# Patient Record
Sex: Male | Born: 1935 | Race: White | Hispanic: No | Marital: Married | Smoking: Former smoker
Health system: Southern US, Community
[De-identification: ages and names within clinical notes are randomized; demographics above are authoritative.]

## PROBLEM LIST (undated history)

## (undated) DIAGNOSIS — J479 Bronchiectasis, uncomplicated: Secondary | ICD-10-CM

## (undated) DIAGNOSIS — I10 Essential (primary) hypertension: Secondary | ICD-10-CM

## (undated) DIAGNOSIS — Z87442 Personal history of urinary calculi: Secondary | ICD-10-CM

## (undated) DIAGNOSIS — H9190 Unspecified hearing loss, unspecified ear: Secondary | ICD-10-CM

## (undated) DIAGNOSIS — N400 Enlarged prostate without lower urinary tract symptoms: Secondary | ICD-10-CM

## (undated) DIAGNOSIS — I639 Cerebral infarction, unspecified: Secondary | ICD-10-CM

## (undated) DIAGNOSIS — N200 Calculus of kidney: Secondary | ICD-10-CM

## (undated) HISTORY — PX: CHOLECYSTECTOMY: SHX55

## (undated) HISTORY — PX: SPLENECTOMY, TOTAL: SHX788

## (undated) HISTORY — PX: SPINE SURGERY: SHX786

## (undated) HISTORY — PX: NOSE SURGERY: SHX723

## (undated) HISTORY — PX: APPENDECTOMY: SHX54

---

## 2005-03-20 ENCOUNTER — Emergency Department (HOSPITAL_COMMUNITY): Admission: EM | Admit: 2005-03-20 | Discharge: 2005-03-20 | Payer: Self-pay | Admitting: Emergency Medicine

## 2005-03-20 IMAGING — CT CT HEAD W/O CM
1 series · 16 of 30 positions shown, 20 images · IV contrast (agent unspecified)
Comparison: None.

CLINICAL DATA: Fell off ladder. 
 HEAD CT WITHOUT CONTRAST:
TECHNIQUE: Contiguous axial images were obtained from the base of the skull through the vertex according to standard protocol without contrast.

[Series 2: brain w/o 4.8 h45s st · axial · non-contrast · 0.43mm/px · z∈[-142,+13]mm · 16 of 36 slices shown, 20 images]
[im 2/36  brain]
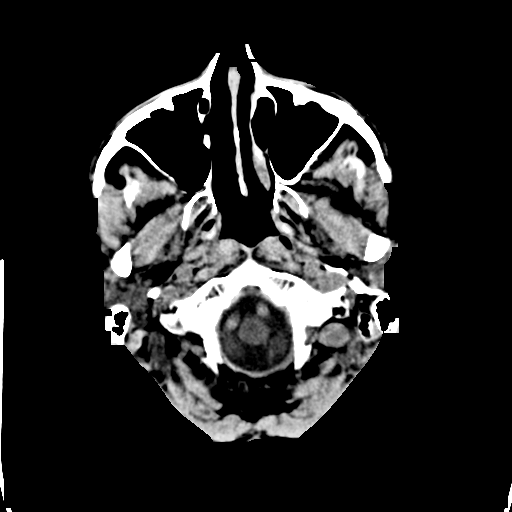
[im 2/36  bone]
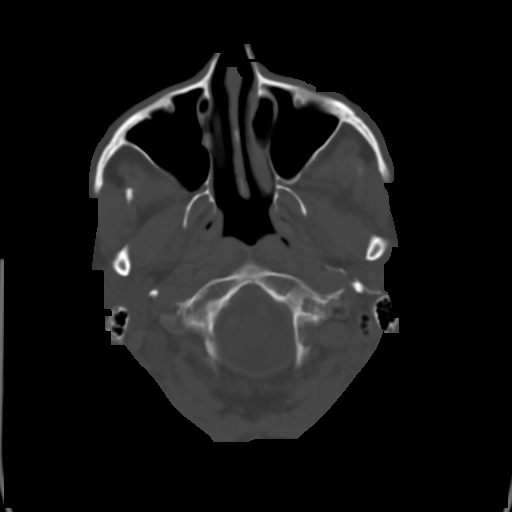
[im 4/36  brain]
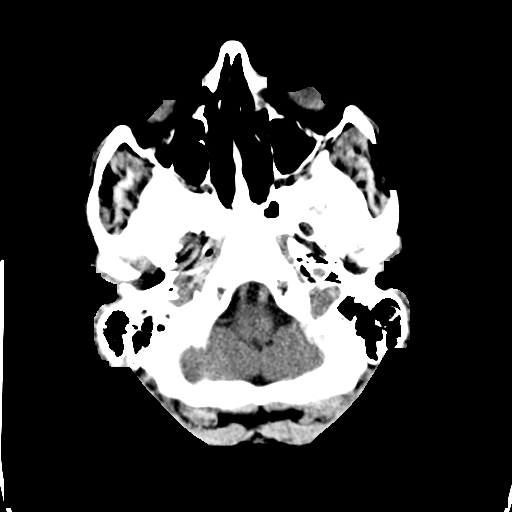
[im 7/36  brain]
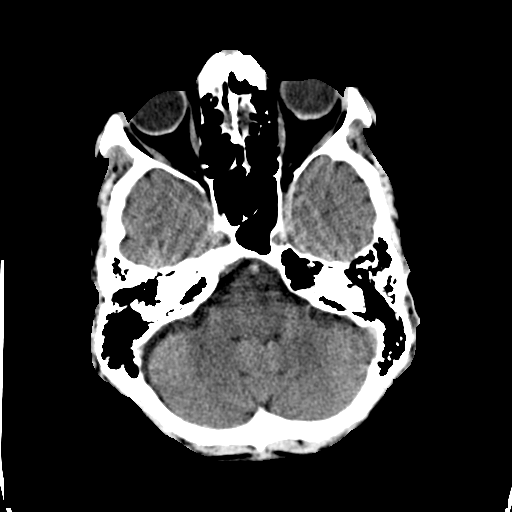
[im 9/36  brain]
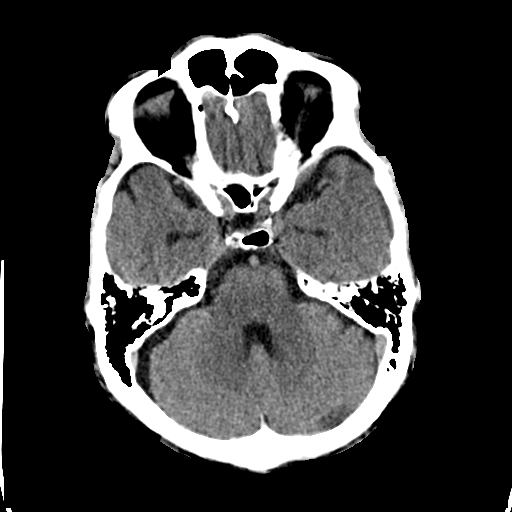
[im 10/36  brain]
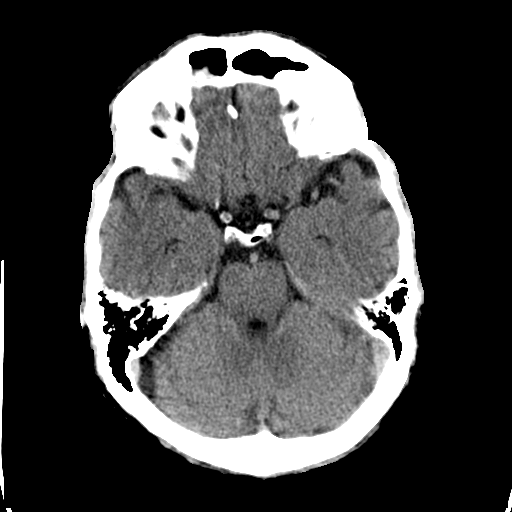
[im 10/36  bone]
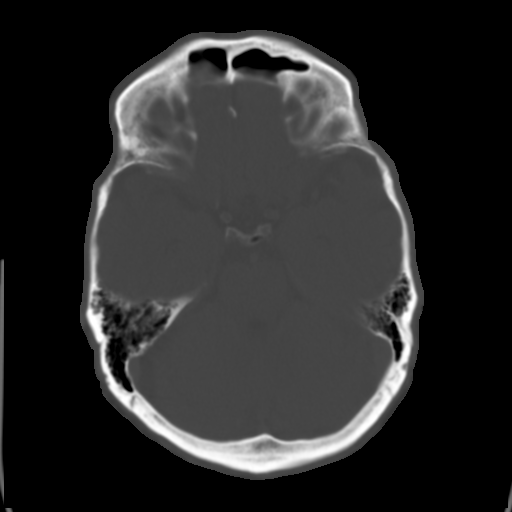
[im 13/36  brain]
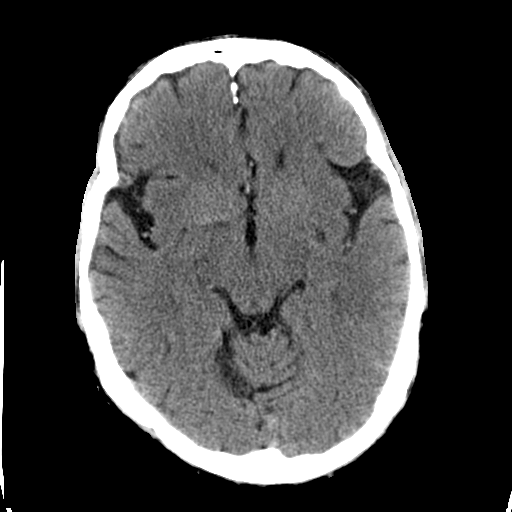
[im 15/36  brain]
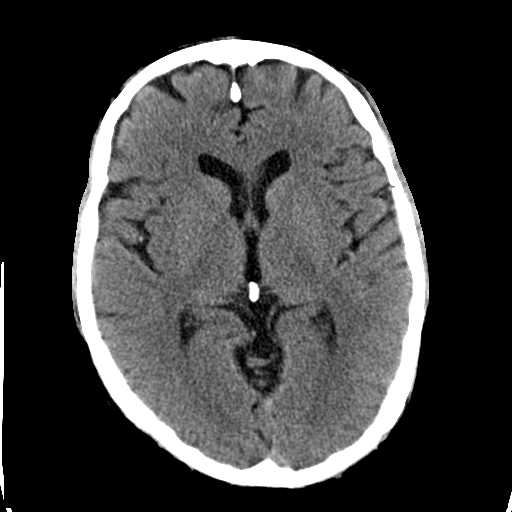
[im 17/36  brain]
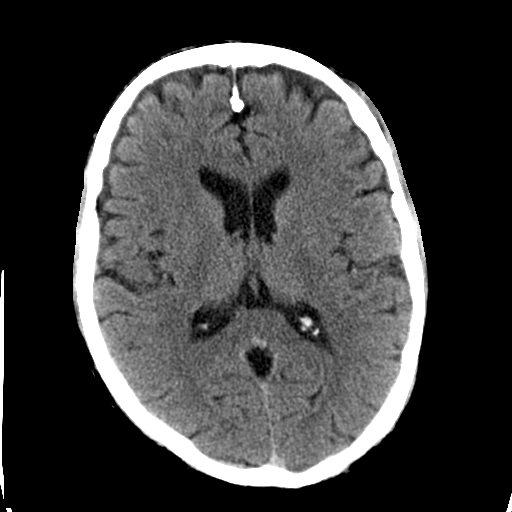
[im 19/36  brain]
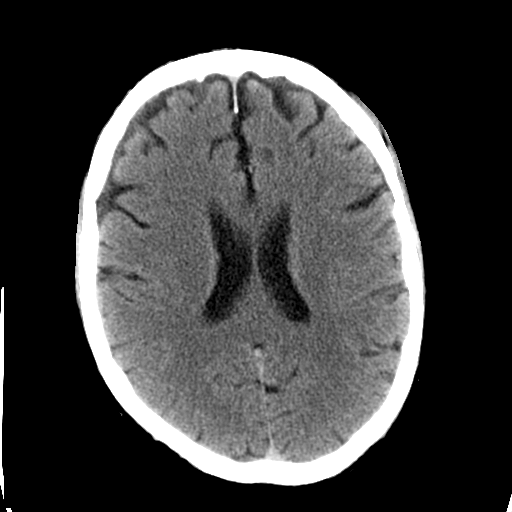
[im 19/36  bone]
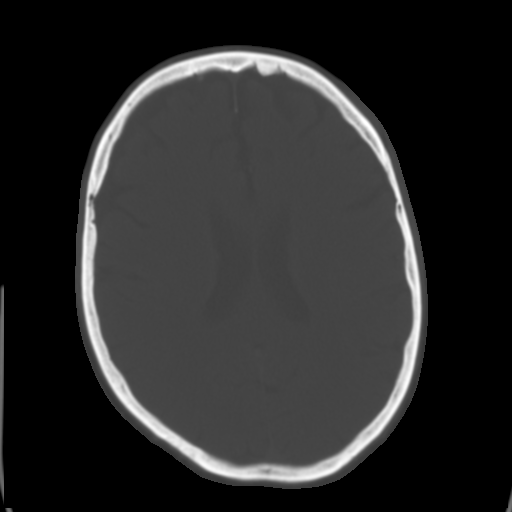
[im 21/36  brain]
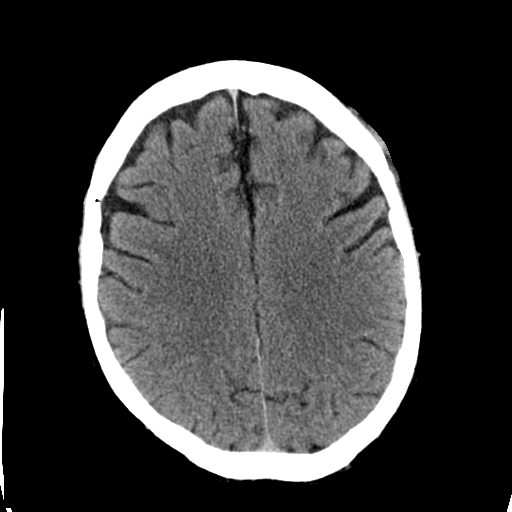
[im 23/36  brain]
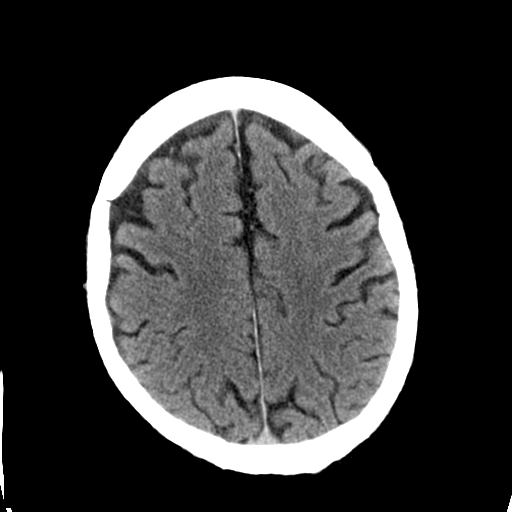
[im 26/36  brain]
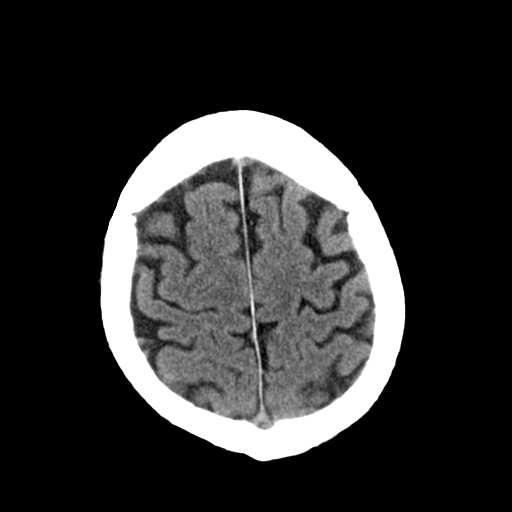
[im 27/36  brain]
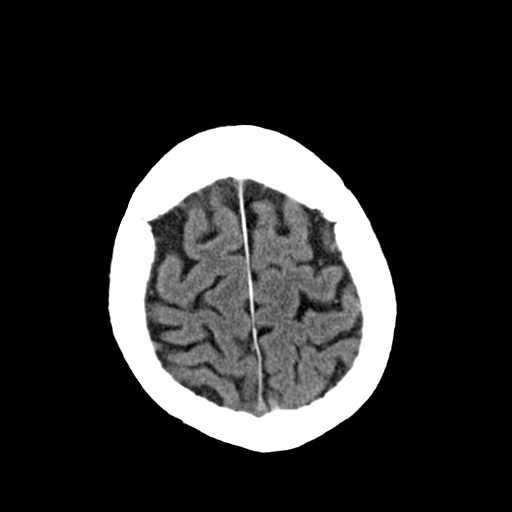
[im 27/36  bone]
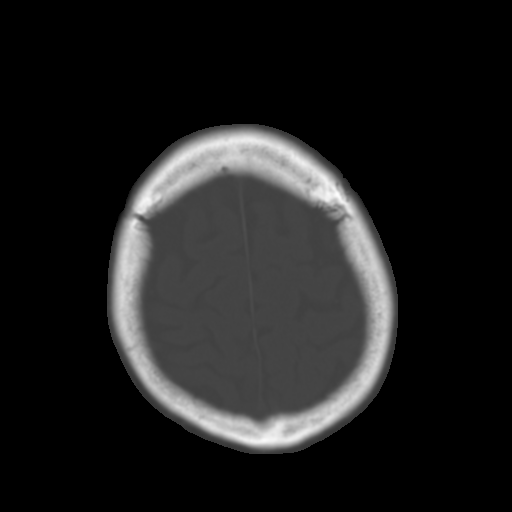
[im 29/36  brain]
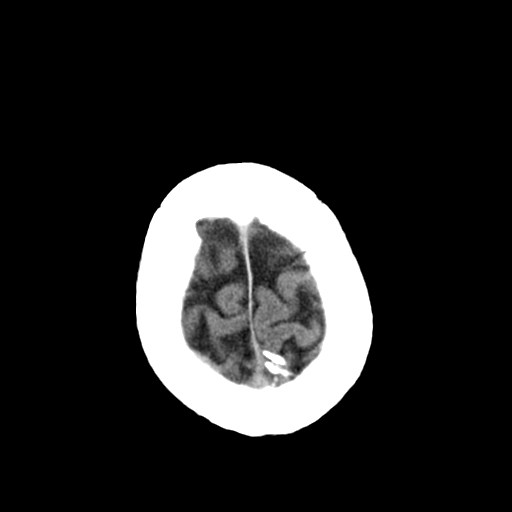
[im 32/36  brain]
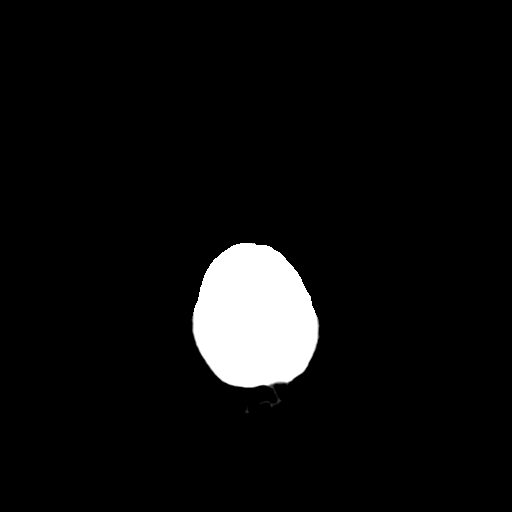
[im 34/36  brain]
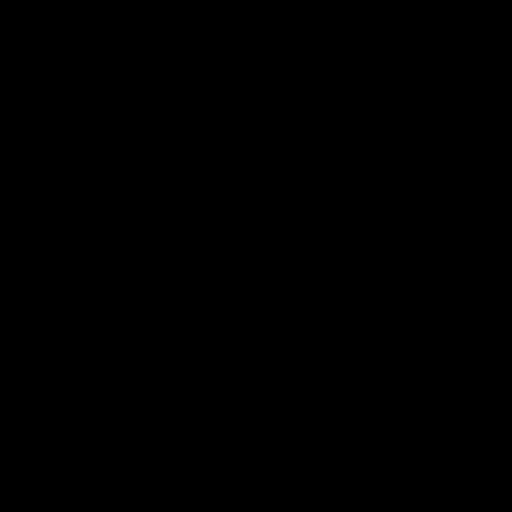

[16 of 30 positions shown; findings below may reference images not displayed]

FINDINGS: There is no intracranial hemorrhage or mass.  There is a chronic infarct in the left lateral basal ganglia.  There is no acute infarct or mass.  There is left frontal scalp hematoma.  There is no skull fracture.
IMPRESSION: No acute intracranial abnormality.

## 2013-08-14 DIAGNOSIS — J310 Chronic rhinitis: Secondary | ICD-10-CM | POA: Insufficient documentation

## 2013-08-14 DIAGNOSIS — J449 Chronic obstructive pulmonary disease, unspecified: Secondary | ICD-10-CM | POA: Insufficient documentation

## 2013-08-14 DIAGNOSIS — H9193 Unspecified hearing loss, bilateral: Secondary | ICD-10-CM | POA: Insufficient documentation

## 2013-12-20 DIAGNOSIS — Z9081 Acquired absence of spleen: Secondary | ICD-10-CM | POA: Insufficient documentation

## 2014-06-05 DIAGNOSIS — K219 Gastro-esophageal reflux disease without esophagitis: Secondary | ICD-10-CM | POA: Insufficient documentation

## 2015-09-22 DIAGNOSIS — I10 Essential (primary) hypertension: Secondary | ICD-10-CM | POA: Insufficient documentation

## 2017-05-17 DIAGNOSIS — G459 Transient cerebral ischemic attack, unspecified: Secondary | ICD-10-CM

## 2017-05-17 HISTORY — DX: Transient cerebral ischemic attack, unspecified: G45.9

## 2017-06-06 DIAGNOSIS — Z87891 Personal history of nicotine dependence: Secondary | ICD-10-CM | POA: Insufficient documentation

## 2017-07-15 DIAGNOSIS — R918 Other nonspecific abnormal finding of lung field: Secondary | ICD-10-CM | POA: Insufficient documentation

## 2017-07-15 DIAGNOSIS — R042 Hemoptysis: Secondary | ICD-10-CM | POA: Insufficient documentation

## 2018-01-23 DIAGNOSIS — Z8673 Personal history of transient ischemic attack (TIA), and cerebral infarction without residual deficits: Secondary | ICD-10-CM | POA: Insufficient documentation

## 2019-03-19 DIAGNOSIS — G4733 Obstructive sleep apnea (adult) (pediatric): Secondary | ICD-10-CM | POA: Insufficient documentation

## 2019-05-18 DIAGNOSIS — A430 Pulmonary nocardiosis: Secondary | ICD-10-CM

## 2019-05-18 HISTORY — DX: Pulmonary nocardiosis: A43.0

## 2019-08-29 DIAGNOSIS — D580 Hereditary spherocytosis: Secondary | ICD-10-CM | POA: Insufficient documentation

## 2019-08-29 DIAGNOSIS — L821 Other seborrheic keratosis: Secondary | ICD-10-CM | POA: Insufficient documentation

## 2019-09-14 ENCOUNTER — Encounter (HOSPITAL_COMMUNITY): Payer: Self-pay | Admitting: Emergency Medicine

## 2019-09-14 ENCOUNTER — Other Ambulatory Visit: Payer: Self-pay

## 2019-09-14 ENCOUNTER — Emergency Department (HOSPITAL_COMMUNITY)
Admission: EM | Admit: 2019-09-14 | Discharge: 2019-09-14 | Disposition: A | Discharge status: Home / Self Care | Payer: Medicare HMO | Attending: Emergency Medicine | Admitting: Emergency Medicine

## 2019-09-14 DIAGNOSIS — R0789 Other chest pain: Secondary | ICD-10-CM | POA: Insufficient documentation

## 2019-09-14 DIAGNOSIS — I10 Essential (primary) hypertension: Secondary | ICD-10-CM | POA: Diagnosis not present

## 2019-09-14 DIAGNOSIS — Z87891 Personal history of nicotine dependence: Secondary | ICD-10-CM | POA: Insufficient documentation

## 2019-09-14 HISTORY — DX: Calculus of kidney: N20.0

## 2019-09-14 HISTORY — DX: Essential (primary) hypertension: I10

## 2019-09-14 HISTORY — DX: Benign prostatic hyperplasia without lower urinary tract symptoms: N40.0

## 2019-09-14 LAB — CBC WITH DIFFERENTIAL/PLATELET
Abs Immature Granulocytes: 0.02 10*3/uL (ref 0.00–0.07)
Basophils Absolute: 0.1 10*3/uL (ref 0.0–0.1)
Basophils Relative: 1 %
Eosinophils Absolute: 0.5 10*3/uL (ref 0.0–0.5)
Eosinophils Relative: 6 %
HCT: 42.5 % (ref 39.0–52.0)
Hemoglobin: 14.1 g/dL (ref 13.0–17.0)
Immature Granulocytes: 0 %
Lymphocytes Relative: 32 %
Lymphs Abs: 2.5 10*3/uL (ref 0.7–4.0)
MCH: 31.8 pg (ref 26.0–34.0)
MCHC: 33.2 g/dL (ref 30.0–36.0)
MCV: 95.7 fL (ref 80.0–100.0)
Monocytes Absolute: 1 10*3/uL (ref 0.1–1.0)
Monocytes Relative: 12 %
Neutro Abs: 3.9 10*3/uL (ref 1.7–7.7)
Neutrophils Relative %: 49 %
Platelets: 358 10*3/uL (ref 150–400)
RBC: 4.44 MIL/uL (ref 4.22–5.81)
RDW: 12.1 % (ref 11.5–15.5)
WBC: 7.9 10*3/uL (ref 4.0–10.5)
nRBC: 0 % (ref 0.0–0.2)

## 2019-09-14 LAB — COMPREHENSIVE METABOLIC PANEL
ALT: 12 U/L (ref 0–44)
AST: 20 U/L (ref 15–41)
Albumin: 3.4 g/dL — ABNORMAL LOW (ref 3.5–5.0)
Alkaline Phosphatase: 50 U/L (ref 38–126)
Anion gap: 9 (ref 5–15)
BUN: 22 mg/dL (ref 8–23)
CO2: 28 mmol/L (ref 22–32)
Calcium: 9.6 mg/dL (ref 8.9–10.3)
Chloride: 101 mmol/L (ref 98–111)
Creatinine, Ser: 1.02 mg/dL (ref 0.61–1.24)
GFR calc Af Amer: >60 mL/min (ref >60)
GFR calc non Af Amer: >60 mL/min (ref >60)
Glucose, Bld: 113 mg/dL — ABNORMAL HIGH (ref 70–99)
Potassium: 4.4 mmol/L (ref 3.5–5.1)
Sodium: 138 mmol/L (ref 135–145)
Total Bilirubin: 0.6 mg/dL (ref 0.3–1.2)
Total Protein: 7 g/dL (ref 6.5–8.1)

## 2019-09-14 LAB — PROTIME-INR
INR: 1.1 (ref 0.8–1.2)
Prothrombin Time: 13.4 seconds (ref 11.4–15.2)

## 2019-09-14 LAB — TROPONIN I (HIGH SENSITIVITY): Troponin I (High Sensitivity): 4 ng/L (ref <18)

## 2019-09-14 NOTE — ED Provider Notes (Signed)
[facility] EMERGENCY DEPARTMENT Provider Note   CSN: [id number] Arrival date & time: 09/14/19  0865     History Chief Complaint  Patient presents with  . Abnormal Lab    Samuel Mata is a 86 y.o. male.  Patient is an 86 year old male with past medical history of hypertension.  He presents today for evaluation of an abnormal laboratory studies.  Patient tells me that he was called by his cardiologist and informed that his troponin was 13 and that he should come to the ER to be evaluated.  Patient reports having intermittent tightness in his chest for the past month.  He describes 1 or 2 episodes per day that are not associated with shortness of breath, nausea, or diaphoresis.  These episodes last approximately 15 seconds, then resolved spontaneously.  He denies any exertional symptoms.  He has no prior cardiac history.  The history is provided by the patient.       Past Medical History:  Diagnosis Date  . Enlarged prostate   . Hypertension   . Renal stones     There are no problems to display for this patient.        No family history on file.  Social History   Tobacco Use  . Smoking status: Former Research scientist (life sciences)  . Smokeless tobacco: Never Used  Substance Use Topics  . Alcohol use: Never  . Drug use: Never    Home Medications Prior to Admission medications   Not on File    Allergies    Codeine  Review of Systems   Review of Systems  All other systems reviewed and are negative.   Physical Exam Updated Vital Signs BP (!) 146/98 (BP Location: Left Arm)   Pulse 66   Temp 98.4 F (36.9 C) (Oral)   Resp 15   Ht 5\' 7"  (1.702 m)   Wt 65 kg   SpO2 100%   BMI 22.44 kg/m   Physical Exam Vitals and nursing note reviewed.  Constitutional:      General: He is not in acute distress.    Appearance: He is well-developed. He is not diaphoretic.  HENT:     Head: Normocephalic and atraumatic.  Cardiovascular:     Rate and Rhythm: Normal  rate and regular rhythm.     Heart sounds: No murmur. No friction rub.  Pulmonary:     Effort: Pulmonary effort is normal. No respiratory distress.     Breath sounds: Normal breath sounds. No wheezing or rales.  Abdominal:     General: Bowel sounds are normal. There is no distension.     Palpations: Abdomen is soft.     Tenderness: There is no abdominal tenderness.  Musculoskeletal:        General: Normal range of motion.     Cervical back: Normal range of motion and neck supple.  Skin:    General: Skin is warm and dry.  Neurological:     Mental Status: He is alert and oriented to person, place, and time.     Coordination: Coordination normal.     ED Results / Procedures / Treatments   Labs (all labs ordered are listed, but only abnormal results are displayed) Labs Reviewed  COMPREHENSIVE METABOLIC PANEL - Abnormal; Notable for the following components:      Result Value   Glucose, Bld 113 (*)    Albumin 3.4 (*)    All other components within normal limits  CBC WITH DIFFERENTIAL/PLATELET  PROTIME-INR  TROPONIN I (  HIGH SENSITIVITY)  TROPONIN I (HIGH SENSITIVITY)    EKG EKG Interpretation  Date/Time:  Friday September 14 2019 19:02:29 EDT Ventricular Rate:  80 PR Interval:  170 QRS Duration: 98 QT Interval:  364 QTC Calculation: 419 R Axis:   -63 Text Interpretation: Normal sinus rhythm Left anterior fascicular block Abnormal ECG No prior ecg for comparison Confirmed by Geoffery Lyons ([id number]) on 09/14/2019 9:22:30 PM   Radiology No results found.  Procedures Procedures (including critical care time)  Medications Ordered in ED Medications - No data to display  ED Course  I have reviewed the triage vital signs and the nursing notes.  Pertinent labs & imaging results that were available during my care of the patient were reviewed by me and considered in my medical decision making (see chart for details).    MDM Rules/Calculators/A&P  Patient presenting here with  complaints of abnormal lab/chest discomfort as described in the HPI.  Patient's work-up here reveals a normal troponin and unchanged EKG.  Patient is not experiencing any discomfort and has not experienced any today prior to coming here.  His troponin here is 4 and the wife reports to me it was 13 when drawn earlier today.  I suspect there was some confusion as to whether this troponin of 13 was rated on the old troponin scale, not the new high-sensitivity scale.  That is the only rationale I can think of that would explain the difference in these 2 readings.  In discussion with the patient, I feel as though discharge is appropriate and patient and wife are comfortable with this as well.  I will have him keep his appointment for his stress test that was previously scheduled and return to the ER if symptoms worsen or change.  Final Clinical Impression(s) / ED Diagnoses Final diagnoses:  None    Rx / DC Orders ED Discharge Orders    None       Geoffery Lyons, MD 09/14/19 2135

## 2019-09-14 NOTE — Discharge Instructions (Addendum)
Keep your appointment for your stress test as previously scheduled.  Return to the ER in the meantime if your symptoms significantly worsen or change.

## 2019-09-14 NOTE — ED Notes (Signed)
Patient verbalizes understanding of discharge instructions. Opportunity for questioning and answers were provided. Armband removed by staff, pt discharged from ED with wife.   

## 2019-09-14 NOTE — ED Notes (Signed)
Staff advised not to collect second troponin per MD Delo. Pt to be discharged.

## 2019-09-14 NOTE — ED Triage Notes (Signed)
Patient received a call from MD clinic and advised him to go to ER due to elevated Troponin blood test result , denies chest pain / respirations unlabored .

## 2019-10-08 ENCOUNTER — Other Ambulatory Visit: Payer: Self-pay

## 2019-10-08 ENCOUNTER — Ambulatory Visit: Payer: Medicare HMO | Admitting: Internal Medicine

## 2019-10-08 ENCOUNTER — Encounter: Payer: Self-pay | Admitting: Internal Medicine

## 2019-10-08 VITALS — BP 110/60 | HR 85 | Temp 98.1°F | Ht 67.0 in | Wt 132.4 lb

## 2019-10-08 DIAGNOSIS — J479 Bronchiectasis, uncomplicated: Secondary | ICD-10-CM | POA: Diagnosis not present

## 2019-10-08 MED ORDER — FLUTTER DEVI: 1.0000 | Freq: Two times a day (BID) | 0 refills | Status: DC

## 2019-10-08 MED ORDER — SODIUM CHLORIDE 3 % IN NEBU: INHALATION_SOLUTION | RESPIRATORY_TRACT | 12 refills | Status: DC | PRN

## 2019-10-08 NOTE — Addendum Note (Signed)
Addended by: Delrae Rend on: 10/08/2019 03:46 PM   Modules accepted: Orders

## 2019-10-08 NOTE — Patient Instructions (Addendum)
The patient should have follow up scheduled with APP in 2 months.   Prior to next visit patient should have: Spirometry/Feno  Start doing nebulizer treatments with saline twice a day followed by the flutter valve to help bring up mucus.  Please bring your sputum culture - first one in the morning - in for [place] to send to the lab for culture. Please bring three of these.   Bronchiectasis  Bronchiectasis is a condition in which the airways in the lungs (bronchi) are damaged and widened. The condition makes it hard for the lungs to get rid of mucus, and it causes mucus to gather in the bronchi. This condition often leads to lung infections, which can make the condition worse. What are the causes? You can be born with this condition or you can develop it later in life. Common causes of this condition include:  Cystic fibrosis.  smoking  Repeated lung infections, such as pneumonia or tuberculosis.  An object or other blockage in the lungs.  Breathing in fluid, food, or other objects (aspiration).  A problem with the immune system and lung structure that is present at birth (congenital). Sometimes the cause is not known. What are the signs or symptoms? Common symptoms of this condition include:  A daily cough that brings up mucus and lasts for more than 3 weeks.  Lung infections that happen often.  Shortness of breath and wheezing.  Weakness and fatigue. How is this diagnosed? This condition is diagnosed with tests, such as:  Chest X-rays or CT scans. These are done to check for changes in the lungs.  Breathing tests. These are done to check how well your lungs are working.  A test of a sample of your saliva (sputum culture). This test is done to check for infection.  Blood tests and other tests. These are done to check for related diseases or causes. How is this treated? Treatment for this condition depends on the severity of the illness and its cause. Treatment may  include:  Medicines that loosen mucus so it can be coughed up (expectorants).  Medicines that relax the muscles of the bronchi (bronchodilators).  Antibiotic medicines to prevent or treat infection.  Physical therapy to help clear mucus from the lungs. Techniques may include: ? Postural drainage. This is when you sit or lie in certain positions so that mucus can drain by gravity. ? Chest percussion. This involves tapping the chest or back with a cupped hand. ? Chest vibration. For this therapy, a hand or special equipment vibrates your chest and back.  Surgery to remove the affected part of the lung. This may be done in severe cases. Follow these instructions at home: Medicines  Take over-the-counter and prescription medicines only as told by your health care provider.  If you were prescribed an antibiotic medicine, take it as told by your health care provider. Do not stop taking the antibiotic even if you start to feel better.  Avoid taking sedatives and antihistamines unless your health care provider tells you to take them. These medicines tend to thicken the mucus in the lungs. Managing symptoms  Perform breathing exercises or techniques to clear your lungs as told by your health care provider.  Consider using a cold steam vaporizer or humidifier in your room or home to help loosen secretions.  If you have a cough that gets worse at night, try sleeping in a semi-upright position. General instructions  Get plenty of rest.  Drink enough fluid to keep your  urine clear or pale yellow.  Stay inside when pollution and ozone levels are high.  Stay up to date with vaccinations and immunizations.  Avoid cigarette smoke and other lung irritants.  Do not use any products that contain nicotine or tobacco, such as cigarettes and e-cigarettes. If you need help quitting, ask your health care provider.  Keep all follow-up visits as told by your health care provider. This is  important. Contact a health care provider if:  You cough up more sputum than before and the sputum is yellow or green in color.  You have a fever.  You cannot control your cough and are losing sleep. Get help right away if:  You cough up blood.  You have chest pain.  You have increasing shortness of breath.  You have pain that gets worse or is not controlled with medicines.  You have a fever and your symptoms suddenly get worse. Summary  Bronchiectasis is a condition in which the airways in the lungs (bronchi) are damaged and widened. The condition makes it hard for the lungs to get rid of mucus, and it causes mucus to gather in the bronchi.  Treatment usually includes therapy to help clear mucus from the lungs.  Stay up to date with vaccinations and immunizations.  This information is not intended to replace advice given to you by your health care provider. Make sure you discuss any questions you have with your health care provider.  Document Revised: 04/15/2017 Document Reviewed: 06/07/2016 Elsevier Patient Education  2020 [employer].

## 2019-10-08 NOTE — Progress Notes (Addendum)
Samuel Mata    [id number]    1930/10/10  Primary Care Physician:[facility]  Referring Physician: Penni Bombard, MD [address] [place],  [place] [address] Reason for Consultation: shortness of breath Date of Consultation: 10/08/2019  Chief complaint:   Chief Complaint  Patient presents with  . Consult    pul infiltration/bronchitis on CT, sob with exertion, cough with brown phlem.     HPI: Samuel Mata is a 86 y.o. gentleman with history of CAD who presents with shortness of breath and cough.   Had recent cardiac evaluation with his cardiologist and was told everything was normal. Now having chest pains that last for seconds. Given negative cardiac evaluation was sent back to pulmonary for further evaluation. Coughing up scant mucus. Short of breath. No fevers chills night sweats. Has been losing weight unintentionally 10 lbs over 7-8 years.   3 years ago had hemoptysis and bronchiectasis and had a bronchoscopy.  Reviewed old patient visit from march 2019 from pulmoary Dr. Steele Berg. Reportedly has grown pseudomonas in the past. See note below:  "Samuel Mata is a 86 y.o. male who presents for follow-up of bronchiectasis. Patient currently doing very well with no significant cough, sputum production hemoptysis, weight loss or night sweats. Underwent fiberoptic bronchoscopy on 07/15/17 with cultures positive for staph aureus and Pseudomonas. This was after patient had had difficulty with productive cough and hemoptysis and a CTPA on 06/19/17 had shown tree-in-bud infiltrates and diffuse bronchiectasis. In any case the patient was treated with a 2-week course of Levaquin with excellent response. His pulmonary function tests on 07/06/17 normal with an FEV1 of 2.18 L 97%. He did smoke a pack of cigarettes a day for approximately 30 years. There is no prior history of inhalational injury or connective tissue disease. He is  retired from [employer] and lives in [place] with his wife."  No recurrent infections requiring abx since 2019. No hospitalizations or ED visits for breathing. Walks about a mile a day. Lives independently with his wife.   Social history:  Occupation: Information systems manager, retired.  Smoking history: 30 pack year smoking history. Quit 1986.   Social History   Occupational History  . Not on file  Tobacco Use  . Smoking status: Former Smoker    Packs/day: 1.00    Years: 30.00    Pack years: 30.00    Types: Cigarettes    Quit date: 10/07/1984    Years since quitting: 35.0  . Smokeless tobacco: Never Used  Substance and Sexual Activity  . Alcohol use: Never  . Drug use: Never  . Sexual activity: Not on file    Relevant family history:  History reviewed. No pertinent family history.  Past Medical History:  Diagnosis Date  . Enlarged prostate   . Hypertension   . Renal stones     Past Surgical History:  Procedure Laterality Date  . APPENDECTOMY    . CHOLECYSTECTOMY    . NOSE SURGERY    . SPLENECTOMY, TOTAL       Physical Exam: Blood pressure 110/60, pulse 85, temperature 98.1 F (36.7 C), temperature source Temporal, height 5\' 7"  (1.702 m), weight 132 lb 6.4 oz (60.1 kg), SpO2 94 %. Gen:      No acute distress, elderly white man, no distress ENT:  no nasal polyps, mucus membranes moist Lungs:    No increased respiratory effort, symmetric chest wall excursion, clear to  auscultation bilaterally, no wheezes or crackles CV:         Regular rate and rhythm; no murmurs, rubs, or gallops.  No pedal edema Abd:      Thin, scaphoid, soft, normal BS MSK: no acute synovitis of DIP or PIP joints, no mechanics hands.  Skin:      Warm and dry; no rashes Neuro: normal speech, no focal facial asymmetry, HOH Psych: alert and oriented x3, normal mood and affect   Data Reviewed/Medical Decision Making:  Independent interpretation of tests: Imaging: On May 3rd had a CTPA study  at [facility] report shows bilateral lower lobe bronchiectasis, micro nodules concerning for atypical infection such as MAI.    PFTs: None available for review.   Labs:  Lab Results  Component Value Date   WBC 7.9 09/14/2019   HGB 14.1 09/14/2019   HCT 42.5 09/14/2019   MCV 95.7 09/14/2019   PLT 358 09/14/2019   Lab Results  Component Value Date   NA 138 09/14/2019   K 4.4 09/14/2019   CL 101 09/14/2019   CO2 28 09/14/2019     Immunization status:  Immunization History  Administered Date(s) Administered  . Influenza, High Dose Seasonal PF 02/15/2019  . PFIZER SARS-COV-2 Vaccination 06/13/2019, 06/26/2019   . I reviewed prior external note(s) from ED, PCP, pulmonary at [facility] . I reviewed the result(s) of the labs and imaging as noted above.  . I have ordered PFTs  Assessment:  Chronic bronchitis with Bronchiectasis History of pseudomonas infection  Plan/Recommendations: Samuel Mata has a history of tobacco use disorder with worsening shortness of breath and cough. He has not been taking any medications for broniectasis in the last three years and has not been on any regular inhalers. Will start with airway clearance regimen with nebulized saline and flutter valve.  Wife will bring in a CD with the images of the CT scan performed at [facility].  Will send sputum cultures AFB and bacterial. I am concerned about resistant organisms based on his history of staph and psa in the past - no urgent need for abx treatment in the absence of pneumonia or constitutional symptoms. If symptoms persist at next visit despite airway clearance can add prn albuterol or a LAMA/LABA. Would avoid ICS in this population.   ADDENDUM: Did receive disc from [facility] and reviewed personally - consistent with nodular and tree in bud opacities concerning for MAI. Also hyperinflation and some centrilobular emphysema. Given to upload to PACS on 5/27.   We discussed disease management and progression at length  today.    Return to Care: Return in about 2 months (around 12/08/2019).  Lenice Llamas, MD Pulmonary and [place]  CC: Shirlee More, Ronalee Red, MD

## 2019-10-11 ENCOUNTER — Telehealth: Payer: Self-pay | Admitting: Internal Medicine

## 2019-10-11 NOTE — Telephone Encounter (Signed)
Returned call to wife reviewing instructions for collection of sputum samples and location of drop off.

## 2019-11-06 ENCOUNTER — Other Ambulatory Visit: Payer: Medicare HMO

## 2019-11-06 DIAGNOSIS — J479 Bronchiectasis, uncomplicated: Secondary | ICD-10-CM

## 2019-11-20 ENCOUNTER — Telehealth: Payer: Self-pay | Admitting: Internal Medicine

## 2019-11-20 DIAGNOSIS — J479 Bronchiectasis, uncomplicated: Secondary | ICD-10-CM

## 2019-11-20 MED ORDER — AMOXICILLIN-POT CLAVULANATE 875-125 MG PO TABS: 1.0000 | ORAL_TABLET | Freq: Two times a day (BID) | ORAL | 0 refills | Status: DC

## 2019-11-20 NOTE — Telephone Encounter (Signed)
Called and spoke with pt letting him know the info stated by Arlys Azar and that we were going to send in augmentin to the pharmacy for him and also needed to schedule him an appt with cxr prior. Pt verbalized understanding. OV scheduled for pt 7/8 per Arlys Avelino with chest xray prior and abx sent to pharmacy for pt by Arlys Bretton. Nothing further needed.

## 2019-11-20 NOTE — Telephone Encounter (Signed)
11/20/2019  I have sent in Augmentin for the patient to start.  Please schedule the patient tentatively for a visit with me on 11/22/2019.  Plan for this to be an in office visit with the patient arriving earlier for a chest x-ray.  I have placed the order for the Augmentin as well as for the chest x-ray.  If symptoms worsen in the meantime he will need to seek emergent care/evaluation at an emergency room.  Elisha Headland, FNP

## 2019-11-20 NOTE — Telephone Encounter (Signed)
Pt does have a flutter valve which he has been using. Called pt who stated that he has not been on any recent abx.

## 2019-11-20 NOTE — Telephone Encounter (Signed)
11/20/2019  Patient needs an office evaluation with APP or MD.  It does not look like patient ever provided the sputum samples for [place] to further investigate potential colonization.  Has he had any recent antibiotics?  Is he using his flutter valve?  May need to consider getting the patient worked into our office for an earlier visit and chest x-ray based off his answers to the above.  Elisha Headland, FNP

## 2019-11-20 NOTE — Telephone Encounter (Signed)
Called and spoke with pt who states he uses the nebulizer once a day which does help some with his symptoms. Pt states when he is using it, he is not able to get any phlegm up. Asked pt if he was using the flutter valve as prescribed and he states he has been using it.  Pt states he will have coughing spells throughout the day and states he is not seeing much improvement in his cough since last OV.  Pt states that he will become SOB from coughing which is a new symptom.  Pt said a prior time when he had problems with cough, he was prescribed levofloxacin by former MD which he said did help.  Asked pt if he has been running any temp and he stated that he has not felt like he has. Had pt check temp while on the phone and temp was 99.0.  Pt wants to know what could be recommended to help with symptoms. Arlys Shivaan, please advise.  Assessment from last OV 10/08/19:  Chronic bronchitis with Bronchiectasis History of pseudomonas infection  Plan/Recommendations: Samuel Mata has a history of tobacco use disorder with worsening shortness of breath and cough. He has not been taking any medications for broniectasis in the last three years and has not been on any regular inhalers. Will start with airway clearance regimen with nebulized saline and flutter valve.  Wife will bring in a CD with the images of the CT scan performed at [facility].  Will send sputum cultures AFB and bacterial. I am concerned about resistant organisms based on his history of staph and psa in the past - no urgent need for abx treatment in the absence of pneumonia or constitutional symptoms. If symptoms persist at next visit despite airway clearance can add prn albuterol or a LAMA/LABA. Would avoid ICS in this population.   ADDENDUM: Did receive disc from [facility] and reviewed personally - consistent with nodular and tree in bud opacities concerning for MAI. Also hyperinflation and some centrilobular emphysema. Given to upload to PACS on  5/27.   We discussed disease management and progression at length today.    Return to Care: Return in about 2 months (around 12/08/2019).  Durel Salts, MD Pulmonary and Critical Care Medicine [facility] Office:[phone]

## 2019-11-22 ENCOUNTER — Ambulatory Visit: Payer: Medicare HMO | Admitting: Pulmonary Disease

## 2019-11-22 ENCOUNTER — Encounter: Payer: Self-pay | Admitting: Pulmonary Disease

## 2019-11-22 ENCOUNTER — Ambulatory Visit (INDEPENDENT_AMBULATORY_CARE_PROVIDER_SITE_OTHER): Payer: Medicare HMO

## 2019-11-22 ENCOUNTER — Other Ambulatory Visit: Payer: Self-pay

## 2019-11-22 VITALS — BP 120/70 | HR 83 | Temp 97.7°F | Ht 67.0 in | Wt 133.6 lb

## 2019-11-22 DIAGNOSIS — J479 Bronchiectasis, uncomplicated: Secondary | ICD-10-CM

## 2019-11-22 IMAGING — DX DG CHEST 2V
2 series · 2 of 2 positions shown · non-contrast
Comparison: CTA chest dated [DATE].

CLINICAL DATA: Cough and congestion.

EXAM:
CHEST - 2 VIEW

[chest pa]
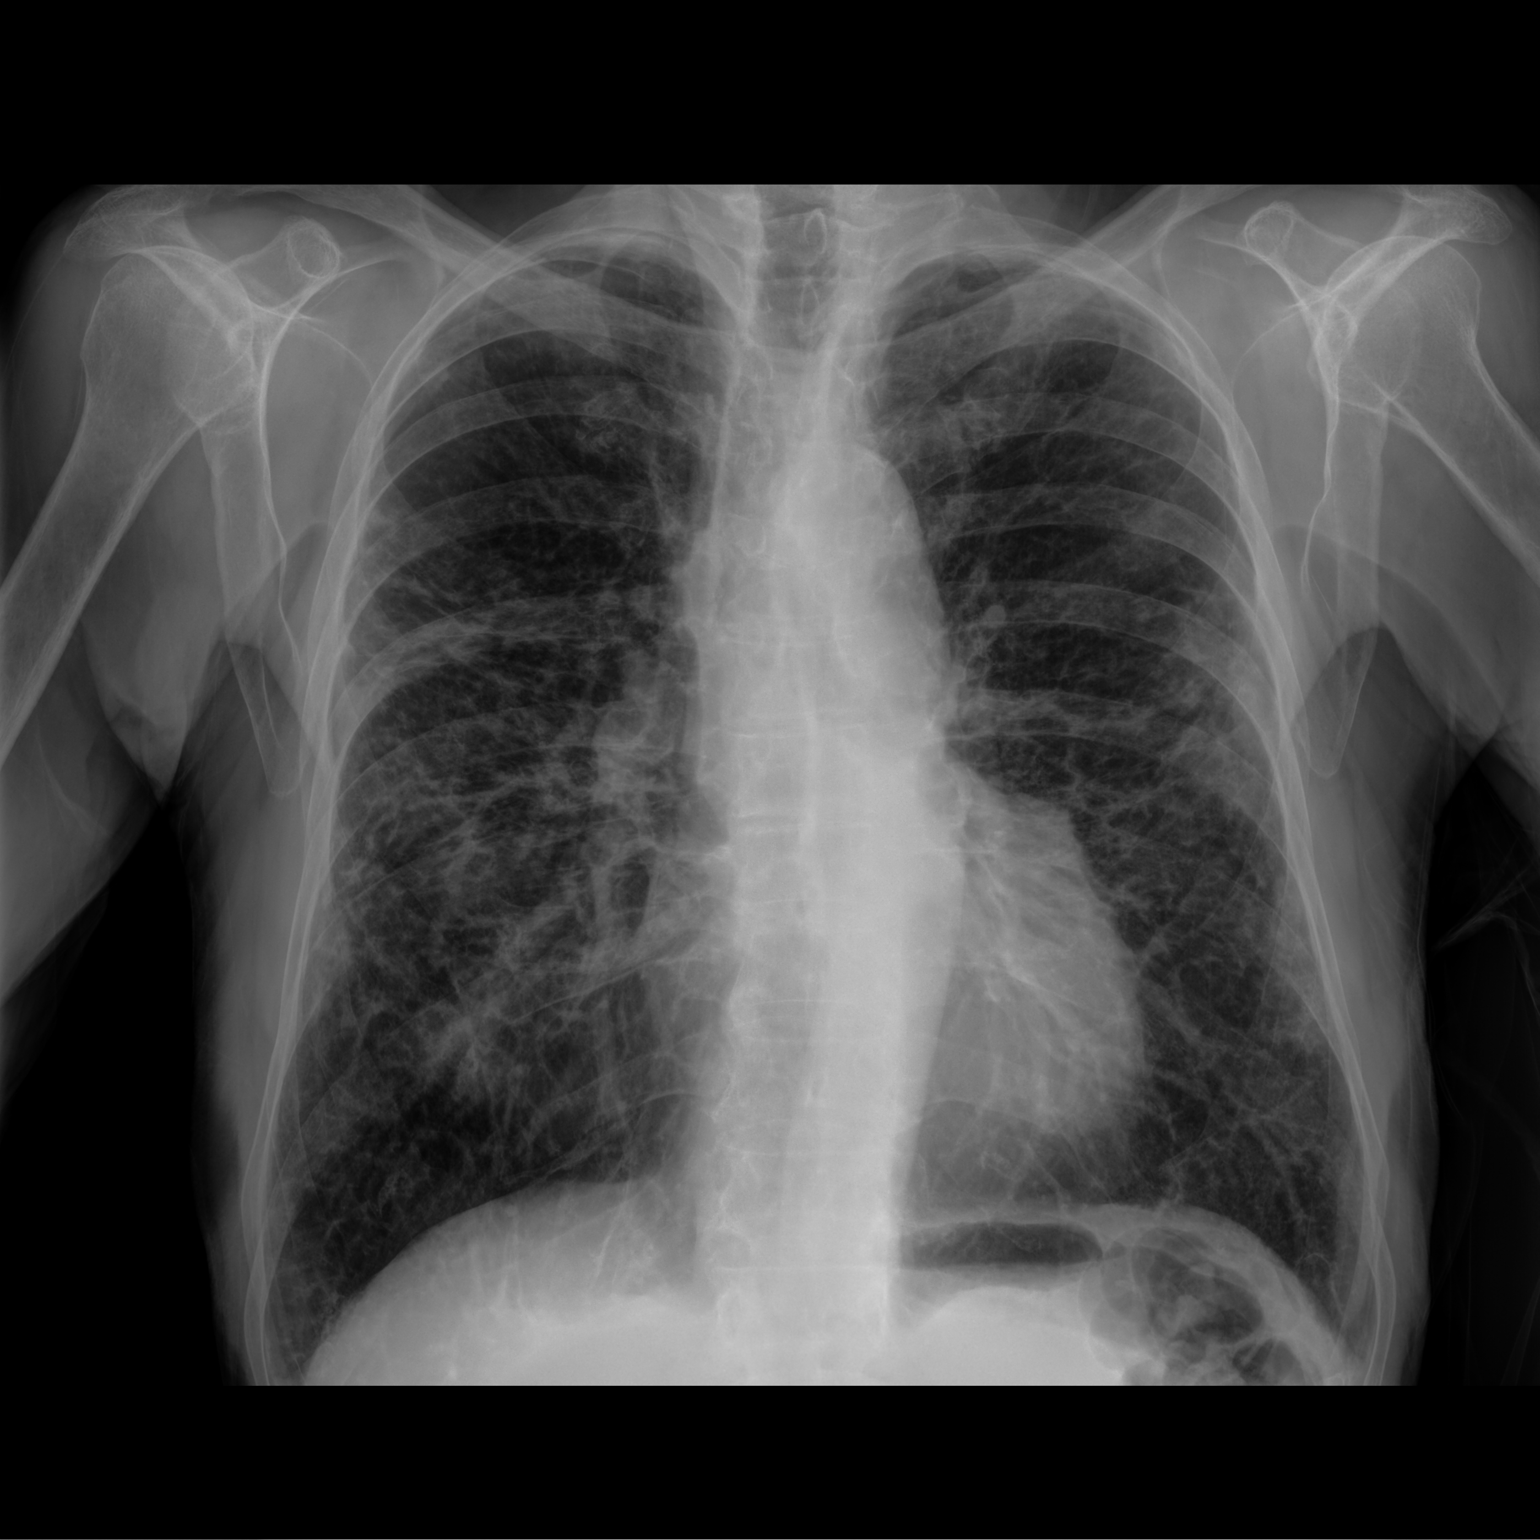

[chest lat]
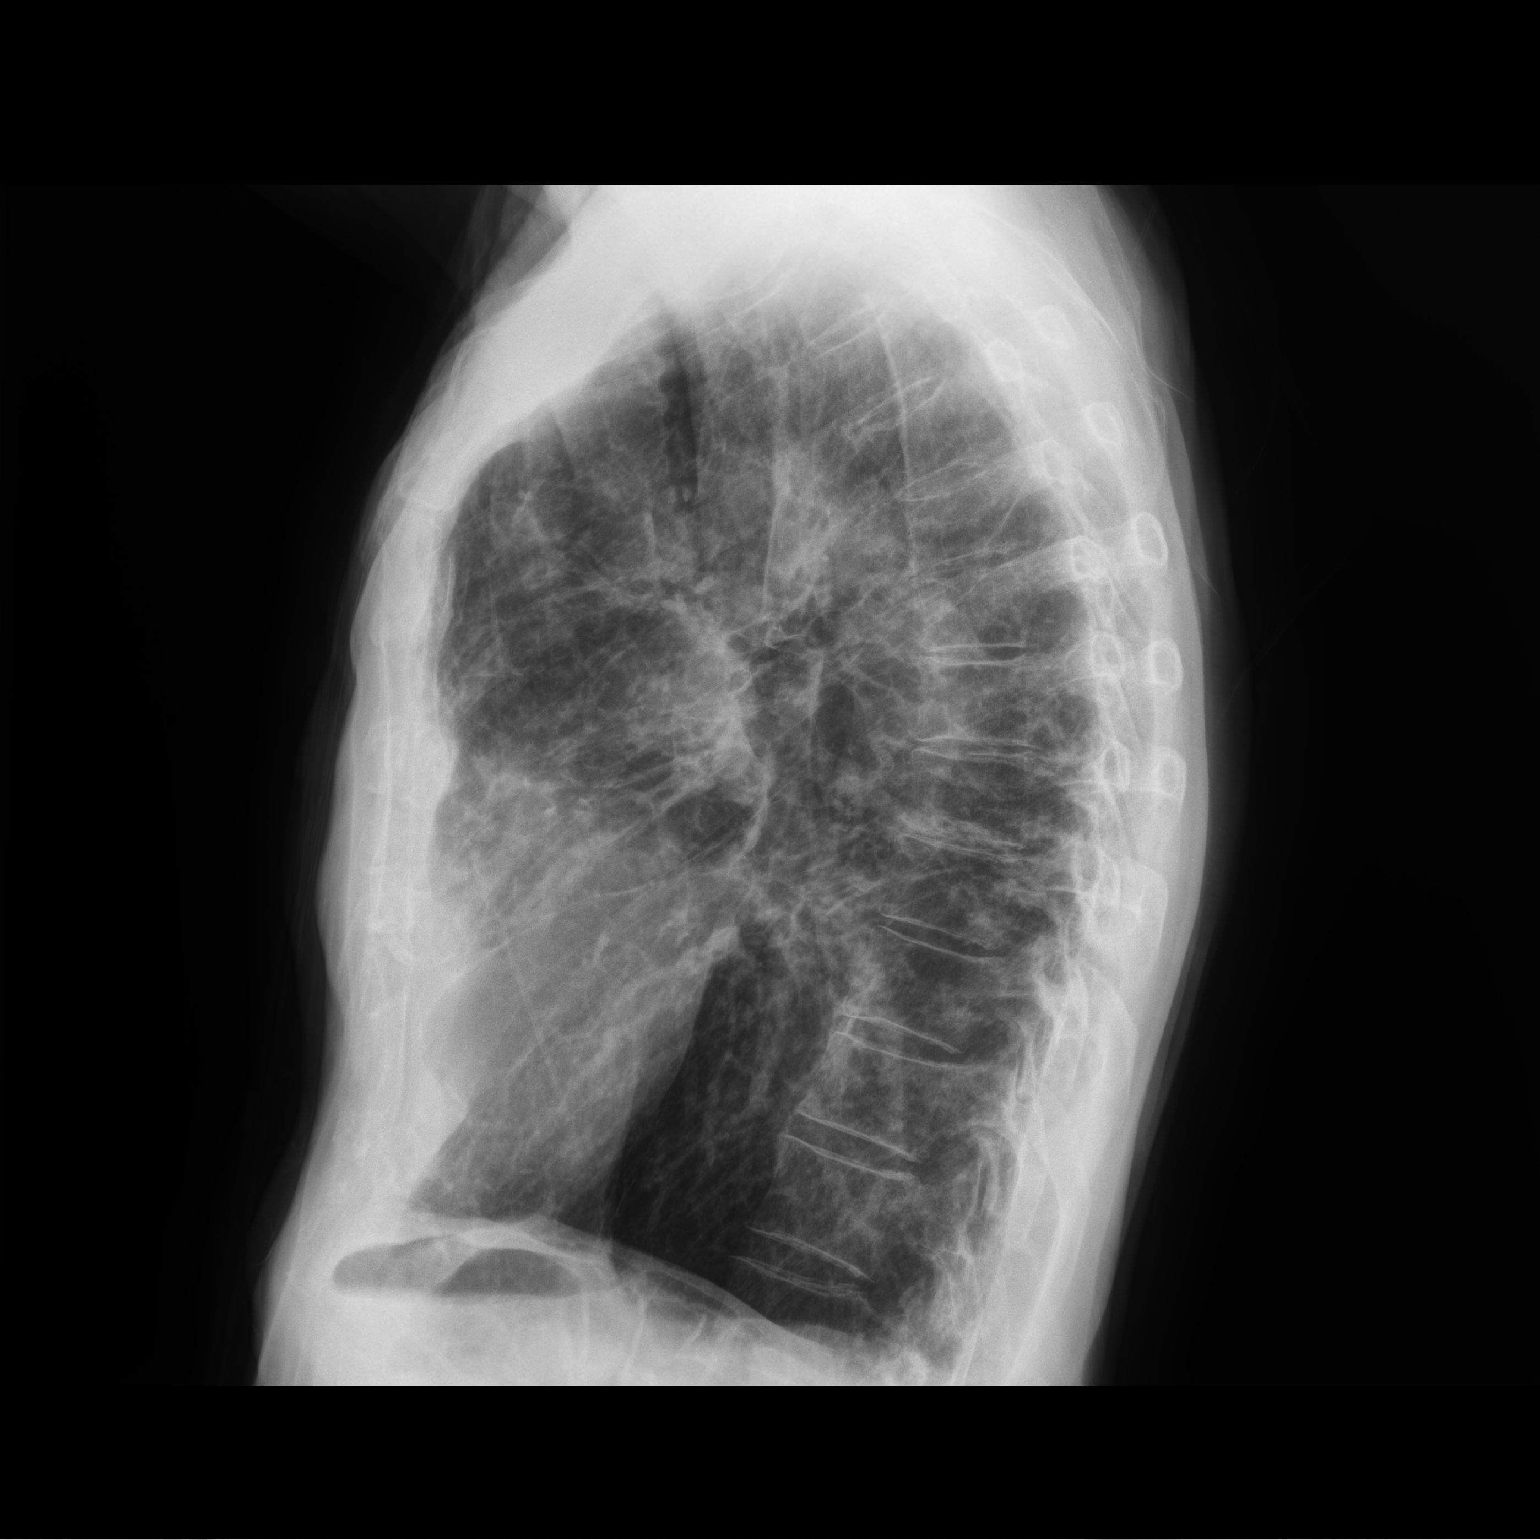

[2 of 2 positions shown; findings below may reference images not displayed]

FINDINGS: The heart size and mediastinal contours are within normal limits.
Normal pulmonary vascularity. The lungs are hyperinflated. Patchy
reticulonodular opacities throughout both lungs, greater on the
right. More focal opacity at the right lung base and in the
subpleural left upper lobe. No pleural effusion or pneumothorax. No
acute osseous abnormality.
IMPRESSION: 1. Patchy reticulonodular opacities throughout both lungs, greater
on the right, suspicious for chronic atypical infection.

## 2019-11-22 NOTE — Patient Instructions (Addendum)
You were seen today by Coral Ceo, NP  for:   1. Bronchiectasis without complication (HCC)  Resp Sputum today   Finish Augmentin   Restart hypertonic saline nebs I would recommend doing these treatments for at least 15 to 20 minutes each time 2 times daily  Bronchiectasis: This is the medical term which indicates that you have damage, dilated airways making you more susceptible to respiratory infection. Use a flutter valve 10 breaths twice a day or 4 to 5 breaths 4-5 times a day to help clear mucus out Let us know if you have cough with change in mucus color or fevers or chills.  At that point you would need an antibiotic. Maintain a healthy nutritious diet, eating whole foods Take your medications as prescribed   Okay to start Ensure high-protein boost as a snack option in between meals  Follow Up:    Return in about 3 months (around 02/22/2020), or if symptoms worsen or fail to improve, for Follow up with Dr. Celine Mans.   Please do your part to reduce the spread of COVID-19:      Reduce your risk of any infection  and COVID19 by using the similar precautions used for avoiding the common cold or flu:   Wash your hands often with soap and warm water for at least 20 seconds.  If soap and water are not readily available, use an alcohol-based hand sanitizer with at least 60% alcohol.   If coughing or sneezing, cover your mouth and nose by coughing or sneezing into the elbow areas of your shirt or coat, into a tissue or into your sleeve (not your hands).  WEAR A MASK when in public   Avoid shaking hands with others and consider head nods or verbal greetings only.  Avoid touching your eyes, nose, or mouth with unwashed hands.   Avoid close contact with people who are sick.  Avoid places or events with large numbers of people in one location, like concerts or sporting events.  If you have some symptoms but not all symptoms, continue to monitor at home and seek medical attention  if your symptoms worsen.  If you are having a medical emergency, call 911.   ADDITIONAL HEALTHCARE OPTIONS FOR PATIENTS  [facility] / e-Visit: [email or web address]         [facility]: [phone]  [facility]: [phone]                   [facility]: [phone]     It is flu season:   >>> Best ways to protect herself from the flu: Receive the yearly flu vaccine, practice good hand hygiene washing with soap and also using hand sanitizer when available, eat a nutritious meals, get adequate rest, hydrate appropriately   Please contact the office if your symptoms worsen or you have concerns that you are not improving.   Thank you for choosing [facility] for your healthcare, and for allowing [place] to partner with you on your healthcare journey. I am thankful to be able to provide care to you today.   Elisha Headland FNP-C    Bronchiectasis  Bronchiectasis is a condition in which the airways in the lungs (bronchi) are damaged and widened. The condition makes it hard for the lungs to get rid of mucus, and it causes mucus to gather in the bronchi. This condition often leads to lung infections, which can make the condition worse. What are the  causes? You can be born with this condition or you can develop it later in life. Common causes of this condition include:  Cystic fibrosis.  Repeated lung infections, such as pneumonia or tuberculosis.  An object or other blockage in the lungs.  Breathing in fluid, food, or other objects (aspiration).  A problem with the immune system and lung structure that is present at birth (congenital). Sometimes the cause is not known. What are the signs or symptoms? Common symptoms of this condition include:  A daily cough that brings up mucus and lasts for more than 3 weeks.  Lung infections that happen often.  Shortness of breath and  wheezing.  Weakness and fatigue. How is this diagnosed? This condition is diagnosed with tests, such as:  Chest X-rays or CT scans. These are done to check for changes in the lungs.  Breathing tests. These are done to check how well your lungs are working.  A test of a sample of your saliva (sputum culture). This test is done to check for infection.  Blood tests and other tests. These are done to check for related diseases or causes. How is this treated? Treatment for this condition depends on the severity of the illness and its cause. Treatment may include:  Medicines that loosen mucus so it can be coughed up (expectorants).  Medicines that relax the muscles of the bronchi (bronchodilators).  Antibiotic medicines to prevent or treat infection.  Physical therapy to help clear mucus from the lungs. Techniques may include: ? Postural drainage. This is when you sit or lie in certain positions so that mucus can drain by gravity. ? Chest percussion. This involves tapping the chest or back with a cupped hand. ? Chest vibration. For this therapy, a hand or special equipment vibrates your chest and back.  Surgery to remove the affected part of the lung. This may be done in severe cases. Follow these instructions at home: Medicines  Take over-the-counter and prescription medicines only as told by your health care provider.  If you were prescribed an antibiotic medicine, take it as told by your health care provider. Do not stop taking the antibiotic even if you start to feel better.  Avoid taking sedatives and antihistamines unless your health care provider tells you to take them. These medicines tend to thicken the mucus in the lungs. Managing symptoms  Perform breathing exercises or techniques to clear your lungs as told by your health care provider.  Consider using a cold steam vaporizer or humidifier in your room or home to help loosen secretions.  If you have a cough that gets  worse at night, try sleeping in a semi-upright position. General instructions  Get plenty of rest.  Drink enough fluid to keep your urine clear or pale yellow.  Stay inside when pollution and ozone levels are high.  Stay up to date with vaccinations and immunizations.  Avoid cigarette smoke and other lung irritants.  Do not use any products that contain nicotine or tobacco, such as cigarettes and e-cigarettes. If you need help quitting, ask your health care provider.  Keep all follow-up visits as told by your health care provider. This is important. Contact a health care provider if:  You cough up more sputum than before and the sputum is yellow or green in color.  You have a fever.  You cannot control your cough and are losing sleep. Get help right away if:  You cough up blood.  You have chest pain.  You have  increasing shortness of breath.  You have pain that gets worse or is not controlled with medicines.  You have a fever and your symptoms suddenly get worse. Summary  Bronchiectasis is a condition in which the airways in the lungs (bronchi) are damaged and widened. The condition makes it hard for the lungs to get rid of mucus, and it causes mucus to gather in the bronchi.  Treatment usually includes therapy to help clear mucus from the lungs.  Stay up to date with vaccinations and immunizations. This information is not intended to replace advice given to you by your health care provider. Make sure you discuss any questions you have with your health care provider. Document Revised: 04/15/2017 Document Reviewed: 06/07/2016 Elsevier Patient Education  2020 [employer].

## 2019-11-22 NOTE — Assessment & Plan Note (Signed)
Had an extensive discussion with patient as well as patient's spouse regarding bronchiectasis and its management.  We reviewed the point of taking hypertonic saline nebs twice daily, flutter valve, sputum monitoring, imaging, and avoiding exacerbations  Plan: Patient to resume hypertonic saline nebs twice daily Patient to continue flutter valve use twice daily We will send patient home with cups for respiratory sputum culture Finish Augmentin as prescribed Follow-up with our office in 3 months

## 2019-11-22 NOTE — Progress Notes (Signed)
@Patient  ID: , male    DOB: [birth year], 86 y.o.   MRN: [id number]  Chief Complaint  Patient presents with  . Follow-up    since ammoxicillin feeling better.  Coughing up grey phlem.  Sleeping better, more active.  Does not like nebulizer.    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  Quit 1986.  30-pack-year smoking history Maintenance: None Pt of: Dr. 1987  11/22/2019  - Visit   86 year old male former smoker from 11/20/2019 reporting that he was having increased shortness of breath, congestion as well as a fever.  He has previously been responsive to fluoroquinolones in the past per the documentation.  Patient was requested to have close follow-up with our office with a chest x-ray as well as antibiotics.  He was started empirically on Augmentin.  Patient presenting to office today reporting he is feeling better ever since starting Augmentin.  He is now afebrile.  Chest x-ray today shows chronic opacities favoring a atypical infection as previously seen on CT imaging back in March at [facility].  Patient's most recent AFB culture is growing in the lab the preliminary results of 2 weeks show a negative concentration.  We will continue to monitor this.  As far as patient and spouse are aware patient has never had sputum testing to evaluate for abnormal bacteria or colonization.  Patient also admits that he has never been adherent to his hypertonic saline nebs.  He thinks that it is too much work to do every day.  We will discuss this today.  He is adherent to his flutter valve.  Although he notices that since stopping the hypertonic saline nebs as well as developing a worsening cough he has not been able to blow his hard on the flutter valve as he was doing previously.  Not adhearent to hypertonic saline    Questionaires /  Pulmonary Flowsheets:   ACT:  No flowsheet data found.  MMRC: No flowsheet data found.  Epworth:  No flowsheet data found.  Tests:   On May 3rd had a CTPA study at [facility] report shows bilateral lower lobe bronchiectasis, micro nodules concerning for atypical infection such as MAI.      FENO:  No results found for: NITRICOXIDE  PFT: No flowsheet data found.  WALK:  No flowsheet data found.  Imaging: DG Chest 2 View  Result Date: 11/22/2019 CLINICAL DATA:  Cough and congestion. EXAM: CHEST - 2 VIEW COMPARISON:  CTA chest dated Sep 17, 2019. FINDINGS: The heart size and mediastinal contours are within normal limits. Normal pulmonary vascularity. The lungs are hyperinflated. Patchy reticulonodular opacities throughout both lungs, greater on the right. More focal opacity at the right lung base and in the subpleural left upper lobe. No pleural effusion or pneumothorax. No acute osseous abnormality. IMPRESSION: 1. Patchy reticulonodular opacities throughout both lungs, greater on the right, suspicious for chronic atypical infection. Electronically Signed   By: Sep 19, 2019 M.D.   On: 11/22/2019 14:46    Lab Results:  CBC    Component Value Date/Time   WBC 7.9 09/14/2019 1922   RBC 4.44 09/14/2019 1922   HGB 14.1 09/14/2019 1922   HCT 42.5 09/14/2019 1922   PLT 358 09/14/2019 1922   MCV 95.7 09/14/2019 1922   MCH 31.8 09/14/2019 1922   MCHC 33.2 09/14/2019 1922   RDW 12.1 09/14/2019 1922  LYMPHSABS 2.5 09/14/2019 1922   MONOABS 1.0 09/14/2019 1922   EOSABS 0.5 09/14/2019 1922   BASOSABS 0.1 09/14/2019 1922    BMET    Component Value Date/Time   NA 138 09/14/2019 1922   K 4.4 09/14/2019 1922   CL 101 09/14/2019 1922   CO2 28 09/14/2019 1922   GLUCOSE 113 (H) 09/14/2019 1922   BUN 22 09/14/2019 1922   CREATININE 1.02 09/14/2019 1922   CALCIUM 9.6 09/14/2019 1922   GFRNONAA >60 09/14/2019 1922   GFRAA >60 09/14/2019 1922    BNP No results found for:  BNP  ProBNP No results found for: PROBNP  Specialty Problems      Pulmonary Problems   Bronchiectasis without complication (HCC)      Allergies  Allergen Reactions  . Codeine     Immunization History  Administered Date(s) Administered  . Influenza, High Dose Seasonal PF 02/15/2019  . PFIZER SARS-COV-2 Vaccination 06/13/2019, 06/26/2019    Past Medical History:  Diagnosis Date  . Enlarged prostate   . Hypertension   . Renal stones     Tobacco History: Social History   Tobacco Use  Smoking Status Former Smoker  . Packs/day: 1.00  . Years: 30.00  . Pack years: 30.00  . Types: Cigarettes  . Quit date: 10/07/1984  . Years since quitting: 35.1  Smokeless Tobacco Never Used   Counseling given: Not Answered   Continue to not smoke  Outpatient Encounter Medications as of 11/22/2019  Medication Sig  . acetaminophen (TYLENOL) 325 MG tablet Take by mouth.  Marland Kitchen amoxicillin-clavulanate (AUGMENTIN) 875-125 MG tablet Take 1 tablet by mouth 2 (two) times daily.  Marland Kitchen aspirin 81 MG chewable tablet Chew by mouth.  . Cholecalciferol 10 MCG (400 UNIT) CAPS Take by mouth.  . losartan (COZAAR) 50 MG tablet Take 50 mg by mouth daily.  . polyethylene glycol powder (GLYCOLAX/MIRALAX) 17 GM/SCOOP powder Take by mouth.  . sodium chloride (OCEAN) 0.65 % nasal spray 1 spray by Both Nostrils route as needed for Congestion.  Marland Kitchen Respiratory Therapy Supplies (FLUTTER) DEVI 1 puff by Does not apply route in the morning and at bedtime. (Patient not taking: Reported on 11/22/2019)  . sodium chloride HYPERTONIC 3 % nebulizer solution Take by nebulization as needed for other. (Patient not taking: Reported on 11/22/2019)   No facility-administered encounter medications on file as of 11/22/2019.     Review of Systems  Review of Systems  Constitutional: Negative for activity change, chills, fatigue, fever and unexpected weight change.  HENT: Positive for congestion. Negative for postnasal drip,  rhinorrhea, sinus pressure, sinus pain and sore throat.   Eyes: Negative.   Respiratory: Positive for cough. Negative for shortness of breath and wheezing.   Cardiovascular: Negative for chest pain and palpitations.  Gastrointestinal: Negative for constipation, diarrhea, nausea and vomiting.  Endocrine: Negative.   Genitourinary: Negative.   Musculoskeletal: Negative.   Skin: Negative.   Neurological: Negative for dizziness and headaches.  Psychiatric/Behavioral: Negative.  Negative for dysphoric mood. The patient is not nervous/anxious.   All other systems reviewed and are negative.    Physical Exam  BP 120/70 (BP Location: Left Arm, Cuff Size: Normal)   Pulse 83   Temp 97.7 F (36.5 C) (Oral)   Ht 5\' 7"  (1.702 m)   Wt 133 lb 9.6 oz (60.6 kg)   SpO2 94%   BMI 20.92 kg/m   Wt Readings from Last 5 Encounters:  11/22/19 133 lb 9.6 oz (60.6 kg)  10/08/19 132  lb 6.4 oz (60.1 kg)  09/14/19 143 lb 4.8 oz (65 kg)    BMI Readings from Last 5 Encounters:  11/22/19 20.92 kg/m  10/08/19 20.74 kg/m  09/14/19 22.44 kg/m     Physical Exam Vitals and nursing note reviewed.  Constitutional:      General: He is not in acute distress.    Appearance: Normal appearance. He is normal weight.  HENT:     Head: Normocephalic and atraumatic.     Right Ear: Hearing and external ear normal.     Left Ear: Hearing and external ear normal.     Nose: No mucosal edema.     Right Turbinates: Not enlarged.     Left Turbinates: Not enlarged.     Mouth/Throat:     Mouth: Mucous membranes are dry.     Pharynx: Oropharynx is clear. No oropharyngeal exudate.  Eyes:     Pupils: Pupils are equal, round, and reactive to light.  Cardiovascular:     Rate and Rhythm: Normal rate and regular rhythm. Occasional extrasystoles are present.    Pulses: Normal pulses.     Heart sounds: Normal heart sounds. No murmur heard.   Pulmonary:     Effort: Pulmonary effort is normal.     Breath sounds: No  decreased breath sounds, wheezing or rales.     Comments: Left lower lobe squeak Musculoskeletal:     Cervical back: Normal range of motion.     Right lower leg: No edema.     Left lower leg: No edema.  Lymphadenopathy:     Cervical: No cervical adenopathy.  Skin:    General: Skin is warm and dry.     Capillary Refill: Capillary refill takes less than 2 seconds.     Findings: No erythema or rash.  Neurological:     General: No focal deficit present.     Mental Status: He is alert and oriented to person, place, and time.     Motor: No weakness.     Coordination: Coordination normal.     Gait: Gait is intact. Gait normal.  Psychiatric:        Mood and Affect: Mood normal.        Behavior: Behavior normal. Behavior is cooperative.        Thought Content: Thought content normal.        Judgment: Judgment normal.       Assessment & Plan:   Bronchiectasis without complication [facility]) Had an extensive discussion with patient as well as patient's spouse regarding bronchiectasis and its management.  We reviewed the point of taking hypertonic saline nebs twice daily, flutter valve, sputum monitoring, imaging, and avoiding exacerbations  Plan: Patient to resume hypertonic saline nebs twice daily Patient to continue flutter valve use twice daily We will send patient home with cups for respiratory sputum culture Finish Augmentin as prescribed Follow-up with our office in 3 months    Return in about 3 months (around 02/22/2020), or if symptoms worsen or fail to improve, for Follow up with Dr. Celine Mans.   Coral Ceo, NP 11/22/2019   This appointment required 45   minutes of patient care (this includes precharting, chart review, review of results, face-to-face care, etc.).

## 2019-12-03 LAB — AFB CULTURE WITH SMEAR (NOT AT ARMC)
Acid Fast Culture: POSITIVE — AB
Acid Fast Smear: NEGATIVE

## 2019-12-03 LAB — ORGANISM ID BY SEQUENCING

## 2019-12-12 ENCOUNTER — Encounter (HOSPITAL_COMMUNITY): Payer: Self-pay

## 2019-12-12 NOTE — Progress Notes (Signed)
Sent a message in mychart regarding the covid drive- thru location change. 

## 2019-12-18 ENCOUNTER — Other Ambulatory Visit (HOSPITAL_COMMUNITY): Payer: Medicare HMO

## 2019-12-21 ENCOUNTER — Ambulatory Visit: Payer: Medicare HMO | Admitting: Primary Care

## 2019-12-21 ENCOUNTER — Other Ambulatory Visit: Payer: Medicare HMO

## 2020-02-04 ENCOUNTER — Inpatient Hospital Stay (HOSPITAL_COMMUNITY)
Admission: RE | Admit: 2020-02-04 | Discharge: 2020-02-04 | Disposition: A | Discharge status: Home / Self Care | Payer: Medicare HMO | Source: Ambulatory Visit

## 2020-02-06 ENCOUNTER — Other Ambulatory Visit: Payer: Self-pay | Admitting: Internal Medicine

## 2020-02-06 DIAGNOSIS — J479 Bronchiectasis, uncomplicated: Secondary | ICD-10-CM

## 2020-02-07 ENCOUNTER — Ambulatory Visit: Payer: Medicare HMO | Admitting: Internal Medicine

## 2020-02-07 ENCOUNTER — Ambulatory Visit (INDEPENDENT_AMBULATORY_CARE_PROVIDER_SITE_OTHER): Payer: Medicare HMO | Admitting: Internal Medicine

## 2020-02-07 ENCOUNTER — Encounter: Payer: Self-pay | Admitting: Internal Medicine

## 2020-02-07 ENCOUNTER — Other Ambulatory Visit: Payer: Self-pay

## 2020-02-07 VITALS — BP 120/70 | HR 77 | Ht 67.5 in | Wt 136.4 lb

## 2020-02-07 DIAGNOSIS — J479 Bronchiectasis, uncomplicated: Secondary | ICD-10-CM

## 2020-02-07 DIAGNOSIS — A439 Nocardiosis, unspecified: Secondary | ICD-10-CM

## 2020-02-07 DIAGNOSIS — Z23 Encounter for immunization: Secondary | ICD-10-CM | POA: Diagnosis not present

## 2020-02-07 LAB — PULMONARY FUNCTION TEST
FEF 25-75 Post: 1.37 L/sec
FEF 25-75 Pre: 1.19 L/sec
FEF2575-%Change-Post: 14 %
FEF2575-%Pred-Post: 97 %
FEF2575-%Pred-Pre: 84 %
FEV1-%Change-Post: 3 %
FEV1-%Pred-Post: 102 %
FEV1-%Pred-Pre: 99 %
FEV1-Post: 2.31 L
FEV1-Pre: 2.22 L
FEV1FVC-%Change-Post: 1 %
FEV1FVC-%Pred-Pre: 94 %
FEV6-%Change-Post: 1 %
FEV6-%Pred-Post: 112 %
FEV6-%Pred-Pre: 110 %
FEV6-Post: 3.38 L
FEV6-Pre: 3.33 L
FEV6FVC-%Change-Post: 0 %
FEV6FVC-%Pred-Post: 107 %
FEV6FVC-%Pred-Pre: 107 %
FVC-%Change-Post: 2 %
FVC-%Pred-Post: 105 %
FVC-%Pred-Pre: 102 %
FVC-Post: 3.44 L
FVC-Pre: 3.36 L
Post FEV1/FVC ratio: 67 %
Post FEV6/FVC ratio: 98 %
Pre FEV1/FVC ratio: 66 %
Pre FEV6/FVC Ratio: 99 %

## 2020-02-07 LAB — NITRIC OXIDE: Nitric Oxide: 17

## 2020-02-07 MED ORDER — SULFAMETHOXAZOLE-TRIMETHOPRIM 800-160 MG PO TABS: 1.0000 | ORAL_TABLET | Freq: Two times a day (BID) | ORAL | 0 refills | Status: DC

## 2020-02-07 NOTE — Progress Notes (Signed)
Samuel Mata    [id number]    [birth year]  Primary Care Physician:[facility] Date of Appointment: 02/07/2020 Established Patient Visit  Chief complaint:   Chief Complaint  Patient presents with  . Follow-up    cough in the am with clear phlem.  sob with exertion.     HPI: Samuel Mata is a 86 y.o. gentleman with bronchiectasis.  Interval Updates: Has restarted airway clearance and is doing it about once a day in the afternoons. He doesn't feel that he is bringing up much sputum. Not always using flutter valve.No constitutional symptoms. No hemoptysis.   I have reviewed the patient's family social and past medical history and updated as appropriate.   Past Medical History:  Diagnosis Date  . Enlarged prostate   . Hypertension   . Renal stones     Past Surgical History:  Procedure Laterality Date  . APPENDECTOMY    . CHOLECYSTECTOMY    . NOSE SURGERY    . SPLENECTOMY, TOTAL      Family History  Problem Relation Age of Onset  . Lung disease Neg Hx     Social History   Occupational History  . Not on file  Tobacco Use  . Smoking status: Former Smoker    Packs/day: 1.00    Years: 30.00    Pack years: 30.00    Types: Cigarettes    Quit date: 10/07/1984    Years since quitting: 35.3  . Smokeless tobacco: Never Used  Substance and Sexual Activity  . Alcohol use: Never  . Drug use: Never  . Sexual activity: Not on file     Physical Exam: Blood pressure 120/70, pulse 77, height 5' 7.5" (1.715 m), weight 136 lb 6.4 oz (61.9 kg), SpO2 94 %.  Gen:      No acute distress Lungs:    No increased respiratory effort, symmetric chest wall excursion, clear to auscultation bilaterally, no wheezes or crackles CV:         Regular rate and rhythm; no murmurs, rubs, or gallops.  No pedal edema   Data Reviewed: Imaging: I have personally reviewed the CT Chest done at [facility] reviewed personally - consistent with nodular and  tree in bud opacities concerning for MAI. Also hyperinflation and some centrilobular emphysema. Given to upload to PACS on 5/27.   PFTs: Mild airflow limitation without a BD response on today's PFTs.  Labs:  Immunization status: Immunization History  Administered Date(s) Administered  . Influenza, High Dose Seasonal PF 02/15/2019  . PFIZER SARS-COV-2 Vaccination 06/13/2019, 06/26/2019    Assessment:  Chronic bronchitis with Bronchiectasis History of pseudomonas infection Pulmonary Nocardiosis - mild infection  Plan/Recommendations: He has not been adherent to airway clearance therapy. Re-emphasized the importance of this at length.  Will start treating his nocardia infection with bactrim. I will refer him to Infectious disease as well for additional input.  I have asked him to send in his sputum cultures for AFB and bacterial. He has the cups at home from last visit.  If symptoms persist at next visit despite airway clearance can add prn albuterol or a LAMA/LABA. Would avoid ICS in this population.   I spent 50 minutes in the care of this patient today including pre-charting, chart review, review of results, face-to-face care, coordination of care and communication with consultants etc.).    Return to Care: Return in about 4 months (around 06/08/2020).   Durel Salts, MD Pulmonary and  Critical Care Medicine [facility] Office:[phone]

## 2020-02-07 NOTE — Progress Notes (Signed)
Spirometry pre and post done today. 

## 2020-02-07 NOTE — Patient Instructions (Signed)
The patient should have follow up scheduled with myself in 4 months.   Follow up with the Infectious Disease Doctor  Please collect sputum cultures.

## 2020-02-19 ENCOUNTER — Other Ambulatory Visit: Payer: Self-pay

## 2020-02-19 ENCOUNTER — Encounter: Payer: Self-pay | Admitting: Infectious Diseases

## 2020-02-19 ENCOUNTER — Ambulatory Visit: Payer: Medicare HMO | Admitting: Infectious Diseases

## 2020-02-19 VITALS — BP 110/70 | HR 73 | Temp 98.0°F | Ht 67.0 in | Wt 137.0 lb

## 2020-02-19 DIAGNOSIS — A430 Pulmonary nocardiosis: Secondary | ICD-10-CM | POA: Diagnosis not present

## 2020-02-19 MED ORDER — SULFAMETHOXAZOLE-TRIMETHOPRIM 800-160 MG PO TABS: 2.0000 | ORAL_TABLET | Freq: Two times a day (BID) | ORAL | 2 refills | Status: DC

## 2020-02-19 NOTE — Progress Notes (Signed)
[facility]                                                             [address], [place], [place], [address]                                                                  Phn. [phone]; Fax: [phone]                                                                             Date: 02/19/2020 Reason for Referral: Nocardia Pulmonary Infection  Referring Provider: Salvatore Decent   Assessment Pulmonary Nocardia - I called micro at [facility] regarding susceptibility for Nocardia, they said the sputum sample growing Nocardia is already discarded. 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 He says he has records at home and will check it and let me know in next appt  Plan Will increase dosingof bactrim to Bactrim DS 2 tabs twice daily Orders Placed This Encounter  Procedures    MYCOBACTERIA, CULTURE, WITH FLUOROCHROME SMEAR   MR BRAIN W WO CONTRAST   CBC   Comprehensive metabolic panel   HIV Antibody (routine testing w rflx)   Hepatitis C antibody   Follow up in 3-4 weeks  All questions and concerns were discussed and addressed. Patient and wife verbalised understanding of the plan  I spent greater than 60 minutes with the patient including greater than 50% of time in face to face counsel of the patient and in coordination of their care.    Odette Fraction, MD [facility]  Office phone [phone] Fax no. 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 Patient is accompanied by his wife and says that he had smokes 1 pack of cigarettes every day for almost 30 years but has not smoked for last 25 years. He was diagnosed with Bronchiectasis almost 3 years ago with bronchoscopy with cultures positive for staph aureus and Pseudomonas. He was treated with a 2-week course of Levaquin with excellent response. His pulmonary function tests on 07/06/17 normal with an FEV1 of 2.18 L 97%. CTPA on 06/19/17 had shown tree-in-bud infiltrates and diffuse bronchiectasis  He did not have any symptoms until 3 months ago when he noticed that his chest was hurting and saw a Cardiologist and was told everything was normal.  During work up, he was found to have an  abnormal Cxray and was referred to Pulmonology. He had a sputum AFB cx on 6/22that grew Nocardia cyriacigeorgica and was started on Bactrim. Patient says he has been taking Bactrim almost for 12 days 1 tab twice a day. He has taken a course of Augmentin early July. Denies any side effects related to Bactrim other than constipation.   He denies having any chest pain. Cough is very minimal and mostly in the morning time with minimal whitish phlegm production. He says he gets SOB after walking approx a mile and the distance he can walk without getting SOB has progressively gotten shorter. He is also hard of hearing and has a hearing aid.   He moved to [place] from [place] approx 2 years ago. Denies current smoking, alcohol and using any drugs.  ROS: 11 point ROS negative except as stated above  Past Medical History:  Diagnosis Date   Enlarged prostate    Hypertension    Renal stones    Past Surgical History:  Procedure Laterality Date   APPENDECTOMY     CHOLECYSTECTOMY     NOSE SURGERY     SPLENECTOMY, TOTAL     Current Outpatient Medications on File Prior to Visit  Medication Sig Dispense Refill   acetaminophen  (TYLENOL) 325 MG tablet Take by mouth.     aspirin 81 MG chewable tablet Chew by mouth.     Cholecalciferol 10 MCG (400 UNIT) CAPS Take by mouth.     losartan (COZAAR) 50 MG tablet Take 50 mg by mouth daily.     polyethylene glycol powder (GLYCOLAX/MIRALAX) 17 GM/SCOOP powder Take by mouth.     Respiratory Therapy Supplies (FLUTTER) DEVI 1 puff by Does not apply route in the morning and at bedtime. 1 each 0   sodium chloride (OCEAN) 0.65 % nasal spray 1 spray by Both Nostrils route as needed for Congestion.     sodium chloride HYPERTONIC 3 % nebulizer solution Take by nebulization as needed for other. 750 mL 12   No current facility-administered medications on file prior to visit.   Allergies  Allergen Reactions   Codeine    Social History   Socioeconomic History   Marital status: Married    Spouse name: Not on file   Number of children: Not on file   Years of education: Not on file   Highest education level: Not on file  Occupational History   Not on file  Tobacco Use   Smoking status: Former Smoker    Packs/day: 1.00    Years: 30.00    Pack years: 30.00    Types: Cigarettes    Quit date: 10/07/1984    Years since quitting: 35.3   Smokeless tobacco: Never Used  Substance and Sexual Activity   Alcohol use: Never   Drug use: Never   Sexual activity: Not on file  Other Topics Concern   Not on file  Social History Narrative   Not on file   Social Determinants of Health   Financial Resource Strain:    Difficulty of Paying Living Expenses: Not on file  Food Insecurity:    Worried About Programme researcher, broadcasting/film/video in the Last Year: Not on file   [employer] of Food in the Last Year: Not on file  Transportation Needs:    Lack of Transportation (Medical): Not on file   Lack of Transportation (Non-Medical): Not on file  Physical Activity:    Days of Exercise per Week: Not on file   Minutes of  Exercise per Session: Not on file  Stress:    Feeling of  Stress : Not on file  Social Connections:    Frequency of Communication with Friends and Family: Not on file   Frequency of Social Gatherings with Friends and Family: Not on file   Attends Religious Services: Not on file   Active Member of Clubs or Organizations: Not on file   Attends BankerClub or Organization Meetings: Not on file   Marital Status: Not on file  Intimate Partner Violence:    Fear of Current or Ex-Partner: Not on file   Emotionally Abused: Not on file   Physically Abused: Not on file   Sexually Abused: Not on file    Vitals BP 110/70    Pulse 73    Temp 98 F (36.7 C)    Ht 5\' 7"  (1.702 m)    Wt 137 lb (62.1 kg)    BMI 21.46 kg/m    Examination  General - not in acute distress, comfortably sitting in chair, HARD OF HEARING HEENT - PEERLA, no pallor and no icterus Chest - b/l clear air entry, COARSE SOUNDS IN THE BASES  CVS- Normal s1s2, RRR Abdomen - Soft, Non tender , non distended Ext- no pedal edema Neuro: grossly normal Back - WNL Psych : calm and cooperative   Recent labs CBC Latest Ref Rng & Units 09/14/2019  WBC 4.0 - 10.5 K/uL 7.9  Hemoglobin 13.0 - 17.0 g/dL 16.114.1  Hematocrit 39 - 52 % 42.5  Platelets 150 - 400 K/uL 358   CMP Latest Ref Rng & Units 09/14/2019  Glucose 70 - 99 mg/dL 096(E113(H)  BUN 8 - 23 mg/dL 22  Creatinine 4.540.61 - 0.981.24 mg/dL 1.191.02  Sodium 147135 - 829145 mmol/L 138  Potassium 3.5 - 5.1 mmol/L 4.4  Chloride 98 - 111 mmol/L 101  CO2 22 - 32 mmol/L 28  Calcium 8.9 - 10.3 mg/dL 9.6  Total Protein 6.5 - 8.1 g/dL 7.0  Total Bilirubin 0.3 - 1.2 mg/dL 0.6  Alkaline Phos 38 - 126 U/L 50  AST 15 - 41 U/L 20  ALT 0 - 44 U/L 12     Pertinent Microbiology Results for orders placed or performed in visit on 11/06/19  AFB Culture & Smear     Status: Abnormal   Collection Time: 11/06/19  3:14 PM   Specimen: Sputum   Sputum  Result Value Ref Range Status   AFB Specimen Processing Concentration  Final   Acid Fast Smear Negative  Final    Acid Fast Culture Positive (A)  Final    Comment: 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     Pertinent Imaging Chest Xray 11/22/2019   FINDINGS: The heart size and mediastinal contours are within normal limits. Normal pulmonary vascularity. The lungs are hyperinflated. Patchy reticulonodular opacities throughout both lungs, greater on the right. More focal opacity at the right lung base and in the subpleural left upper lobe. No pleural effusion or pneumothorax. No acute osseous abnormality.  IMPRESSION: 1. Patchy reticulonodular opacities throughout both lungs, greater on the right, suspicious for chronic atypical infection.   CTAPE 09/17/19  FINDINGS:  # Pulmonary arteries:No evidence of pulmonary embolism.   # Aorta:No evidence of dissection or other acute pathology.   # Mediastinum: Heart and thoracic lymph nodes are normal in size.   # Abdomen:No acute findings in the visible abdominal structures.   # Lungs:There are multiple small foci of patchy opacities bilaterally are without significant change since prior study. Bronchiectasis and mucous plugging are also present bilaterally greatest in the lower lobes. Micronodular infiltrate in the posterior right lower lobe redemonstrated on image 108 series 5, lung windows. Small portion of the right lower lobe in the costophrenic gutter is also partially consolidated as it was previously. No pleural effusions or pneumothoraces. No concerning nodules.   # MSK:No acute or aggressive bony abnormalities.    IMPRESSION:  1. No evidence of pulmonary embolism.  2. Multiple  bilateral patchy opacities, bronchiectasis, wall thickening and micronodular infiltrate in the right lower lobe consistent with chronic infection such as MAI   All pertinent labs/Imagings/notes reviewed. All pertinent plain films and CT images have been personally visualized and interpreted; radiology reports have been reviewed. Decision making incorporated into the Impression / Recommendations.

## 2020-02-20 ENCOUNTER — Emergency Department (HOSPITAL_COMMUNITY)
Admission: EM | Admit: 2020-02-20 | Discharge: 2020-02-20 | Disposition: A | Discharge status: Home / Self Care | Payer: Medicare HMO | Attending: Emergency Medicine | Admitting: Emergency Medicine

## 2020-02-20 ENCOUNTER — Other Ambulatory Visit: Payer: Self-pay

## 2020-02-20 ENCOUNTER — Telehealth: Payer: Self-pay | Admitting: Infectious Diseases

## 2020-02-20 DIAGNOSIS — Z79899 Other long term (current) drug therapy: Secondary | ICD-10-CM | POA: Diagnosis not present

## 2020-02-20 DIAGNOSIS — E871 Hypo-osmolality and hyponatremia: Secondary | ICD-10-CM | POA: Insufficient documentation

## 2020-02-20 DIAGNOSIS — I10 Essential (primary) hypertension: Secondary | ICD-10-CM | POA: Insufficient documentation

## 2020-02-20 DIAGNOSIS — Z87891 Personal history of nicotine dependence: Secondary | ICD-10-CM | POA: Diagnosis not present

## 2020-02-20 DIAGNOSIS — R799 Abnormal finding of blood chemistry, unspecified: Secondary | ICD-10-CM | POA: Diagnosis present

## 2020-02-20 DIAGNOSIS — A430 Pulmonary nocardiosis: Secondary | ICD-10-CM | POA: Insufficient documentation

## 2020-02-20 DIAGNOSIS — Z7982 Long term (current) use of aspirin: Secondary | ICD-10-CM | POA: Diagnosis not present

## 2020-02-20 LAB — URINALYSIS, ROUTINE W REFLEX MICROSCOPIC
Bilirubin Urine: NEGATIVE
Glucose, UA: NEGATIVE mg/dL
Hgb urine dipstick: NEGATIVE
Ketones, ur: 5 mg/dL — AB
Leukocytes,Ua: NEGATIVE
Nitrite: NEGATIVE
Protein, ur: NEGATIVE mg/dL
Specific Gravity, Urine: 1.014 (ref 1.005–1.030)
pH: 6 (ref 5.0–8.0)

## 2020-02-20 LAB — CBC
HCT: 41.5 % (ref 38.5–50.0)
HCT: 41 % (ref 39.0–52.0)
Hemoglobin: 14.4 g/dL (ref 13.2–17.1)
Hemoglobin: 14.1 g/dL (ref 13.0–17.0)
MCH: 31.8 pg (ref 27.0–33.0)
MCH: 31.9 pg (ref 26.0–34.0)
MCHC: 34.7 g/dL (ref 32.0–36.0)
MCHC: 34.4 g/dL (ref 30.0–36.0)
MCV: 91.6 fL (ref 80.0–100.0)
MCV: 92.8 fL (ref 80.0–100.0)
MPV: 10 fL (ref 7.5–12.5)
Platelets: 336 10*3/uL (ref 140–400)
Platelets: 342 10*3/uL (ref 150–400)
RBC: 4.53 10*6/uL (ref 4.20–5.80)
RBC: 4.42 MIL/uL (ref 4.22–5.81)
RDW: 11.8 % (ref 11.0–15.0)
RDW: 11.8 % (ref 11.5–15.5)
WBC: 5.9 10*3/uL (ref 3.8–10.8)
WBC: 5 10*3/uL (ref 4.0–10.5)
nRBC: 0 % (ref 0.0–0.2)

## 2020-02-20 LAB — COMPREHENSIVE METABOLIC PANEL
AG Ratio: 1.1 (calc) (ref 1.0–2.5)
ALT: 13 U/L (ref 9–46)
AST: 24 U/L (ref 10–35)
Albumin: 3.7 g/dL (ref 3.6–5.1)
Alkaline phosphatase (APISO): 58 U/L (ref 35–144)
BUN/Creatinine Ratio: 15 (calc) (ref 6–22)
BUN: 19 mg/dL (ref 7–25)
CO2: 27 mmol/L (ref 20–32)
Calcium: 9.7 mg/dL (ref 8.6–10.3)
Chloride: 95 mmol/L — ABNORMAL LOW (ref 98–110)
Creat: 1.23 mg/dL — ABNORMAL HIGH (ref 0.70–1.11)
Globulin: 3.4 g/dL (calc) (ref 1.9–3.7)
Glucose, Bld: 81 mg/dL (ref 65–99)
Potassium: 6 mmol/L — ABNORMAL HIGH (ref 3.5–5.3)
Sodium: 129 mmol/L — ABNORMAL LOW (ref 135–146)
Total Bilirubin: 0.4 mg/dL (ref 0.2–1.2)
Total Protein: 7.1 g/dL (ref 6.1–8.1)

## 2020-02-20 LAB — BASIC METABOLIC PANEL
Anion gap: 10 (ref 5–15)
BUN: 15 mg/dL (ref 8–23)
CO2: 22 mmol/L (ref 22–32)
Calcium: 9 mg/dL (ref 8.9–10.3)
Chloride: 92 mmol/L — ABNORMAL LOW (ref 98–111)
Creatinine, Ser: 1.12 mg/dL (ref 0.61–1.24)
GFR calc non Af Amer: 59 mL/min — ABNORMAL LOW (ref >60)
Glucose, Bld: 97 mg/dL (ref 70–99)
Potassium: 4.9 mmol/L (ref 3.5–5.1)
Sodium: 124 mmol/L — ABNORMAL LOW (ref 135–145)

## 2020-02-20 LAB — HIV ANTIBODY (ROUTINE TESTING W REFLEX): HIV 1&2 Ab, 4th Generation: NONREACTIVE

## 2020-02-20 LAB — HEPATITIS C ANTIBODY
Hepatitis C Ab: NONREACTIVE
SIGNAL TO CUT-OFF: 0.01 (ref <1.00)

## 2020-02-20 LAB — CBG MONITORING, ED: Glucose-Capillary: 83 mg/dL (ref 70–99)

## 2020-02-20 NOTE — ED Provider Notes (Signed)
[facility] Provider Note   CSN: [id number] Arrival date & time: 02/20/20  1348     History Chief Complaint  Patient presents with  . Abnormal Lab    [facility]ata is a 86 y.o. male.  [facility] is a 86 y.o. male with a history of Nocardia pneumonia, hypertension, bronchiectasis, kidney stones, who presents to the emergency department for evaluation of elevated potassium.  He was seen by Dr. Elinor Parkinson with infectious disease yesterday for follow-up for Nocardia Cyriacigeorgica for which he started taking Bactrim 12 days ago, had routine lab work done yesterday and was called back this morning and told that his potassium was 6 and he needed to go to the ED for treatment of his hyperkalemia, they asked him to hold the Bactrim.  He has not been started on any other new medications recently.  He does state that he had some mild nausea this morning after taking his antibiotics that he had already taken prior to the call from the doctor's office and felt a little weak and fatigued, but otherwise no new symptoms.  No vomiting, no new shortness of breath or fevers.  No other aggravating or alleviating factors.        Past Medical History:  Diagnosis Date  . Enlarged prostate   . Hypertension   . Renal stones     Patient Active Problem List   Diagnosis Date Noted  . 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 Bronchiectasis without complication (HCC) 11/22/2019    Past Surgical History:  Procedure Laterality Date  . APPENDECTOMY    . CHOLECYSTECTOMY    . NOSE SURGERY    . SPLENECTOMY, TOTAL         Family History  Problem Relation Age of Onset  . Lung disease Neg Hx     Social History   Tobacco Use  . Smoking status: Former Smoker    Packs/day: 1.00    Years: 30.00    Pack years: 30.00    Types: Cigarettes    Quit date: 10/07/1984    Years since quitting: 35.3  . Smokeless tobacco: Never Used  Substance Use Topics  . Alcohol  use: Never  . Drug use: Never    Home Medications Prior to Admission medications   Medication Sig Start Date End Date Taking? Authorizing Provider  acetaminophen (TYLENOL) 325 MG tablet Take 325-650 mg by mouth every 6 (six) hours as needed for moderate pain.    Yes [provider]  Alum Hydroxide-Mag Carbonate (ACID GONE ANTACID PO) Take 1-2 tablets by mouth daily as needed (indigestion).   Yes [provider]  aspirin 81 MG chewable tablet Chew 81 mg by mouth daily.    Yes [provider]  Cholecalciferol 10 MCG (400 UNIT) CAPS Take 400 Units by mouth daily.    Yes [provider]  losartan (COZAAR) 50 MG tablet Take 50 mg by mouth daily. 08/09/19  Yes [provider]  polyethylene glycol powder (GLYCOLAX/MIRALAX) 17 GM/SCOOP powder Take 0.5 Containers by mouth daily as needed for moderate constipation.    Yes [provider]  sodium chloride (OCEAN) 0.65 % nasal spray Place 1 spray into the nose daily as needed for congestion.    Yes [provider]  sodium chloride HYPERTONIC 3 % nebulizer solution Take by nebulization as needed for other. Patient taking differently: Take 4 mLs by nebulization as needed for other or cough.  10/08/19  Yes Charlott Holler, MD  sulfamethoxazole-trimethoprim (  BACTRIM DS) 800-160 MG tablet Take 2 tablets by mouth 2 (two) times daily. 02/19/20  Yes Odette Fraction, MD  Respiratory Therapy Supplies (FLUTTER) DEVI 1 puff by Does not apply route in the morning and at bedtime. 10/08/19   Charlott Holler, MD    Allergies    Codeine  Review of Systems   Review of Systems  Constitutional: Positive for fatigue. Negative for chills and fever.  Eyes: Negative for visual disturbance.  Respiratory: Negative for cough and shortness of breath.   Cardiovascular: Negative for chest pain.  Gastrointestinal: Positive for nausea. Negative for vomiting.  Musculoskeletal: Negative for arthralgias and myalgias.    Skin: Negative for color change and rash.  Neurological: Positive for weakness (Generalized). Negative for seizures, syncope, numbness and headaches.  All other systems reviewed and are negative.   Physical Exam Updated Vital Signs BP (!) 142/79   Pulse (!) 58   Temp 97.7 F (36.5 C) (Oral)   Resp (!) 21   Ht 5\' 7"  (1.702 m)   Wt 59 kg   SpO2 98%   BMI 20.36 kg/m   Physical Exam Vitals and nursing note reviewed.  Constitutional:      General: He is not in acute distress.    Appearance: Normal appearance. He is well-developed. He is not ill-appearing or diaphoretic.     Comments: Elderly gentleman overall well-appearing and in no acute distress  HENT:     Head: Normocephalic and atraumatic.  Eyes:     General:        Right eye: No discharge.        Left eye: No discharge.  Cardiovascular:     Rate and Rhythm: Normal rate and regular rhythm.     Heart sounds: Normal heart sounds. No murmur heard.  No friction rub. No gallop.   Pulmonary:     Effort: Pulmonary effort is normal. No respiratory distress.     Breath sounds: Normal breath sounds. No wheezing or rales.     Comments: Respirations equal and unlabored, patient able to speak in full sentences, lungs clear to auscultation bilaterally with some diminished breath sounds Abdominal:     General: Bowel sounds are normal. There is no distension.     Palpations: Abdomen is soft. There is no mass.     Tenderness: There is no abdominal tenderness. There is no guarding.     Comments: Abdomen soft, nondistended, nontender to palpation in all quadrants without guarding or peritoneal signs  Musculoskeletal:        General: No deformity.     Cervical back: Neck supple.     Right lower leg: No edema.     Left lower leg: No edema.  Skin:    General: Skin is warm and dry.     Capillary Refill: Capillary refill takes less than 2 seconds.  Neurological:     Mental Status: He is alert.     Coordination: Coordination normal.      Comments: Speech is clear, able to follow commands Moves extremities without ataxia, coordination intact  Psychiatric:        Mood and Affect: Mood normal.        Behavior: Behavior normal.     ED Results / Procedures / Treatments   Labs (all labs ordered are listed, but only abnormal results are displayed) Labs Reviewed  BASIC METABOLIC PANEL - Abnormal; Notable for the following components:      Result Value   Sodium 124 (*)  Chloride 92 (*)    GFR calc non Af Amer 59 (*)    All other components within normal limits  URINALYSIS, ROUTINE W REFLEX MICROSCOPIC - Abnormal; Notable for the following components:   Ketones, ur 5 (*)    All other components within normal limits  CBC  CBG MONITORING, ED    EKG EKG Interpretation  Date/Time:  Wednesday February 20 2020 14:40:52 EDT Ventricular Rate:  64 PR Interval:  184 QRS Duration: 104 QT Interval:  402 QTC Calculation: 414 R Axis:   -72 Text Interpretation: Normal sinus rhythm Left axis deviation No significant change since last tracing Confirmed by Gwyneth Sprout ([id number]) on 02/20/2020 3:30:35 PM   Radiology No results found.  Procedures Procedures (including critical care time)  Medications Ordered in ED Medications - No data to display  ED Course  I have reviewed the triage vital signs and the nursing notes.  Pertinent labs & imaging results that were available during my care of the patient were reviewed by me and considered in my medical decision making (see chart for details).    MDM Rules/Calculators/A&P                         86 year old male sent from infectious disease office after he was found to have a potassium of 6.0 on labs collected yesterday after patient was started on Bactrim for a nocardia lung infection, creatinine was slightly increased from previous at 1.23.  Patient was told to hold Bactrim until they get this checked out and was asked to come to the ED for further evaluation.  He did take  1 dose of his Bactrim this morning prior to this call, and reports he took this on an empty stomach and then felt a bit nauseous.  He states he has had some mild generalized weakness and fatigue and was a bit lightheaded this morning but this has resolved.  Today potassium is 4.9, and patient with normal creatinine of 1.12.  Patient noted to have hyponatremia, sodium today is 124, yesterday it was 129.  The rest of patient's work-up is unremarkable.  Will discuss with infectious disease.  Case discussed with Dr. Luciana Axe with infectious disease, who is pleased to hear that potassium has returned to normal today, does not feel that hyponatremia is likely due to the Bactrim or nocardia infection, does not think that he will need hospital admission for this though, recommends fluid restriction and they will get the patient in in the next few days for recheck of his sodium.  Dr. Luciana Axe recommends he continue to hold his Bactrim until he is seen by infectious disease for recheck.  Patient is on losartan but is not on any diuretics or medications that would likely contribute to hyponatremia.  Will have patient restrict to 1200 mL of fluid daily and follow closely with primary care and infectious disease.  Return precautions discussed.  Discharged home in good condition.  Final Clinical Impression(s) / ED Diagnoses Final diagnoses:  Hyponatremia    Rx / DC Orders ED Discharge Orders    None       Dartha Lodge, [place] 02/20/20 1743    Gwyneth Sprout, MD 02/21/20 2311

## 2020-02-20 NOTE — Telephone Encounter (Signed)
Called patient's wife and informed about the K results. He is going to stop taking Bactrim from now. He had taken his Bactrim in the morning. Wife told me she is going to take patient to ED for EKG and treatment of hyperkalemia.

## 2020-02-20 NOTE — ED Triage Notes (Signed)
Patient arrives to ED with complaints of his K level being 6. Per pt he was dx with Nocardia Cyriacigeorgica and started on Bactrim. Yesterday pt increase dosing of bactrim to two tabs daily. Today pt states he feel weak, nauseated, and dizzy.

## 2020-02-20 NOTE — ED Notes (Signed)
Patient verbalizes understanding of discharge instructions. Opportunity for questioning and answers were provided. Armband removed by staff, pt discharged from ED in wheelchair to home.   

## 2020-02-20 NOTE — Discharge Instructions (Signed)
Your potassium was back to normal today but your sodium is a bit low, please restrict your fluid intake to 1200 mL daily, follow-up with your infectious disease doctor in the next few days to have your sodium rechecked, please continue to hold Bactrim until you are seen by infectious disease.  Please call to schedule close follow-up appointment with your primary care provider as well.  If you develop numbness, focal weakness, headaches, seizures or any other new or concerning symptoms return immediately for reevaluation.

## 2020-02-21 ENCOUNTER — Telehealth: Payer: Self-pay

## 2020-02-21 ENCOUNTER — Other Ambulatory Visit: Payer: Self-pay | Admitting: Infectious Diseases

## 2020-02-21 DIAGNOSIS — E875 Hyperkalemia: Secondary | ICD-10-CM

## 2020-02-21 NOTE — Telephone Encounter (Signed)
Spoke with the patient's wife to relay the below information per Dr. Elinor Parkinson. The wife had concerns about the patient losing weight while adhering to a fluid restriction that was recommended in the emergency department yesterday. RN spoke with Dr. Elinor Parkinson to determine if patient still needs to follow fluid restriction.   Dr. Elinor Parkinson advises that the patient stop taking the Bactrim and continue to follow fluid restriction recommended by the ED due to the patient's low sodium. Dr. Elinor Parkinson will reevaluate treatment plan once labs result next week. RN relayed this information to the patient's wife who verbalizes understanding.   RN instructed patient's wife to contact primary care provider in regards to switching the patient to an alternate antihypertensive. RN advised wife to not stop the losartan without speaking to primary care provider and to let them recommend best course regarding antihypertensive medication. Wife verbalizes understanding.   Wife instructed to call with any concerns.  Upcoming lab appointment scheduled for 02/28/20 at 9:30 am to repeat BMP.   Sandie Ano, RN

## 2020-02-21 NOTE — Telephone Encounter (Signed)
-----   Message from Odette Fraction, MD sent at 02/21/2020  7:53 AM EDT ----- Regarding: Bactrim Patient had elevated K while on Bactrim therapy. He went to ED where his K was normal. Could you please let the patient know that he can start taking Bactrim 1 DS tab PO TID rather than 2 DS tabs PO BID.   He is also on Losartan which can cause high K. Let patient talk to PCP if this can be changed to another antihypertensive drug.  He will also need to come to the office for repeat BMP in 1 week after starting Bactrim to make sure K is WNL.

## 2020-02-21 NOTE — Telephone Encounter (Signed)
Thank you :)

## 2020-02-25 ENCOUNTER — Telehealth: Payer: Self-pay

## 2020-02-25 NOTE — Telephone Encounter (Signed)
Spoke with patient and he will come tomorrow to have BMP drawn [facility] [place]

## 2020-02-25 NOTE — Telephone Encounter (Signed)
-----   Message from Odette Fraction, MD sent at 02/25/2020 12:41 PM EDT ----- Regarding: BMP Can we schedule visit for BMP earlier than 10/14 ?

## 2020-02-26 ENCOUNTER — Other Ambulatory Visit: Payer: Self-pay

## 2020-02-26 ENCOUNTER — Other Ambulatory Visit: Payer: Medicare HMO

## 2020-02-26 DIAGNOSIS — E875 Hyperkalemia: Secondary | ICD-10-CM

## 2020-02-27 LAB — BASIC METABOLIC PANEL
BUN: 23 mg/dL (ref 7–25)
CO2: 30 mmol/L (ref 20–32)
Calcium: 9.7 mg/dL (ref 8.6–10.3)
Chloride: 101 mmol/L (ref 98–110)
Creat: 0.95 mg/dL (ref 0.70–1.11)
Glucose, Bld: 76 mg/dL (ref 65–99)
Potassium: 5 mmol/L (ref 3.5–5.3)
Sodium: 138 mmol/L (ref 135–146)

## 2020-02-28 ENCOUNTER — Other Ambulatory Visit: Payer: Medicare HMO

## 2020-03-03 ENCOUNTER — Telehealth: Payer: Self-pay | Admitting: *Deleted

## 2020-03-03 NOTE — Telephone Encounter (Signed)
Patient's wife called back with additional questions.  She would like to know if [facility] need to continue the fluid restrictions of 1255ml/day that started 10/6.  His pcp stopped the losartan 10/8.  He is supposed to restart bactrim ds 1 tab BID today 10/18, has a MRI with/without contrast scheduled 10/21, follow up 10/22.  She remembers that he is supposed to drink fluids with bactrim, with the MRI contrast.   Andree Coss, RN

## 2020-03-03 NOTE — Telephone Encounter (Signed)
Relayed to patient's wife [name]. She said he has difficulty hearing on the phone.  She verbalized understanding. Andree Coss, RN

## 2020-03-03 NOTE — Telephone Encounter (Signed)
-----   Message from Odette Fraction, MD sent at 03/03/2020  8:35 AM EDT ----- Regarding: bactrim Please let patien know that his last labs showed normal potassium and normal sodium. He can start taking Bactrim 1 ds tab PO BID until I see him in the clinic on 10/22. Thanks

## 2020-03-03 NOTE — Telephone Encounter (Signed)
Patient does not need fluid restriction now his Na Levels are WNL Can drinks fluids as usual.

## 2020-03-03 NOTE — Telephone Encounter (Signed)
Relayed to patient's wife. He will resume his normal fluids, will watch for symptoms and will contact primary care should they occur again. She understands that [facility] does not have stat labs like ER does.  Andree Coss, RN

## 2020-03-06 ENCOUNTER — Ambulatory Visit (HOSPITAL_COMMUNITY)
Admission: RE | Admit: 2020-03-06 | Discharge: 2020-03-06 | Disposition: A | Discharge status: Home / Self Care | Payer: Medicare HMO | Source: Ambulatory Visit | Attending: Infectious Diseases | Admitting: Infectious Diseases

## 2020-03-06 ENCOUNTER — Other Ambulatory Visit: Payer: Self-pay

## 2020-03-06 DIAGNOSIS — A430 Pulmonary nocardiosis: Secondary | ICD-10-CM | POA: Insufficient documentation

## 2020-03-06 IMAGING — MR MR HEAD WO/W CM
16 of 17 series · 41 of 48 positions shown · IV contrast (gadavist)
Comparison: [DATE]

CLINICAL DATA: Nocardia pulmonary infection. Recent elevated
potassium levels.

EXAM:
MRI HEAD WITHOUT AND WITH CONTRAST
TECHNIQUE: Multiplanar, multiecho pulse sequences of the brain and surrounding
structures were obtained without and with intravenous contrast.
CONTRAST:  6mL GADAVIST GADOBUTROL 1 MMOL/ML IV SOLN

[Series 5: DWI · axial · 3.0mm · 1.36mm/px · z∈[-16,+130]mm · 6 of 104 slices shown (1 of 4)]
[im 1/104]
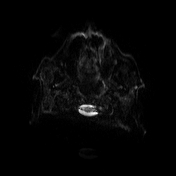
[im 21/104]
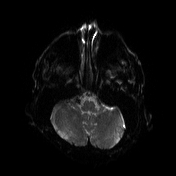
[im 42/104]
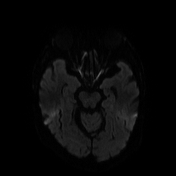
[im 62/104]
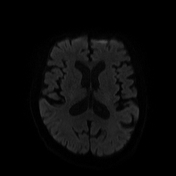
[im 83/104]
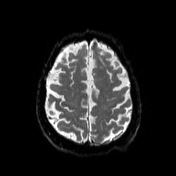
[im 104/104]
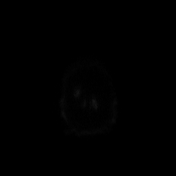

[Series 6: DWI · axial · 3.0mm · 1.36mm/px · z∈[-16,+130]mm · 3 of 52 slices shown (2 of 4)]
[im 1/52]
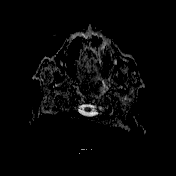
[im 26/52]
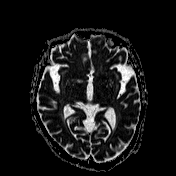
[im 52/52]
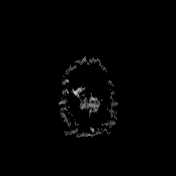

[Series 7: T1 · sagittal · 5.0mm · 0.75mm/px · 1 of 24 slices shown (1 of 4)]
[im 1/24]
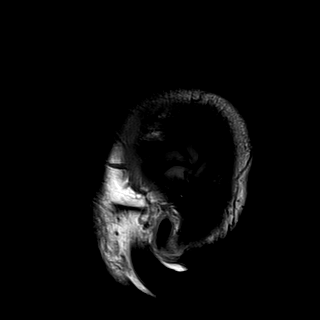

[Series 8: T2 · axial · 5.0mm · 0.65mm/px · 1 of 26 slices shown]
[im 1/26]
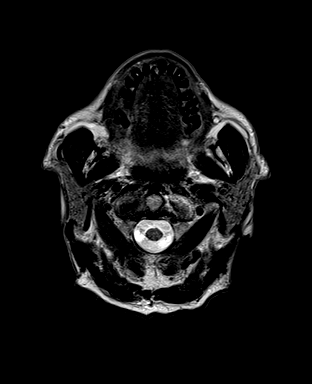

[Series 9: mip_images(sw) · axial · 24.0mm · 0.75mm/px · z∈[-16,+121]mm · 2 of 49 slices shown]
[im 1/49]
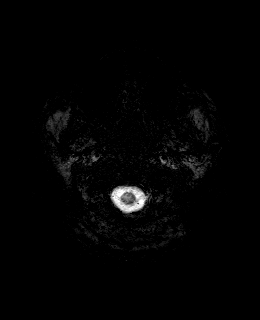
[im 49/49]
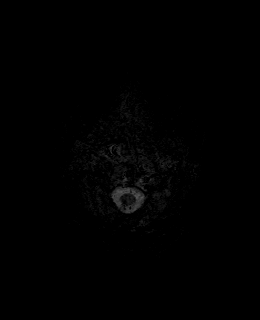

[Series 10: swi_images · axial · 3.0mm · 0.75mm/px · z∈[-26,+131]mm · 3 of 56 slices shown]
[im 1/56]
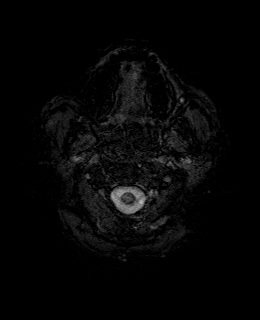
[im 28/56]
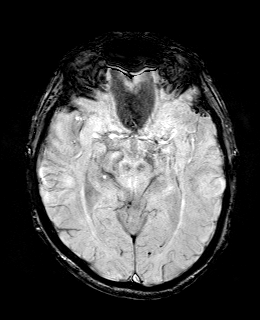
[im 56/56]
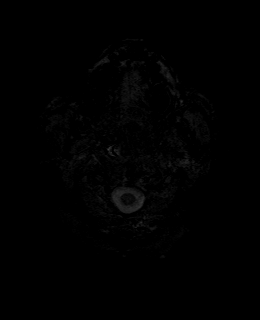

[Series 11: FLAIR · axial · 3.0mm · 0.75mm/px · z∈[-21,+125]mm · 2 of 52 slices shown]
[im 1/52]
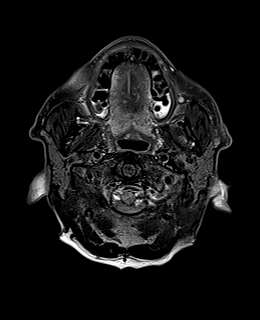
[im 52/52]
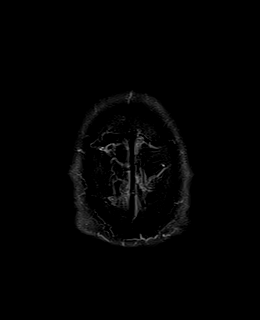

[Series 12: T1 · axial · 1.0mm · 0.94mm/px · z∈[-18,+134]mm · 7 of 160 slices shown (2 of 4)]
[im 1/160]
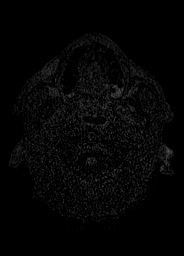
[im 27/160]
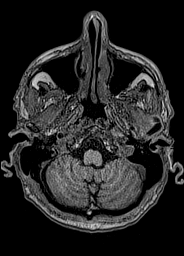
[im 54/160]
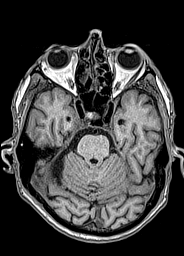
[im 80/160]
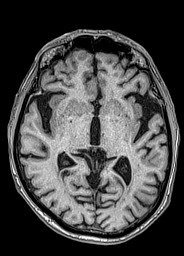
[im 107/160]
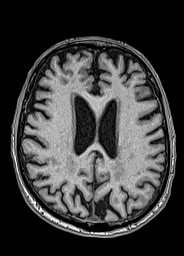
[im 133/160]
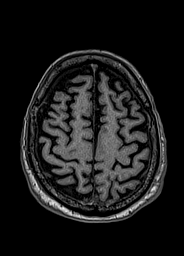
[im 160/160]
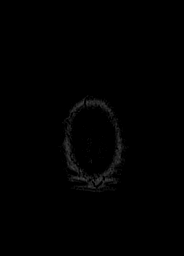

[Series 13: DWI · coronal · 5.0mm · 1.31mm/px · 3 of 64 slices shown (3 of 4)]
[im 1/64]
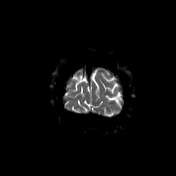
[im 32/64]
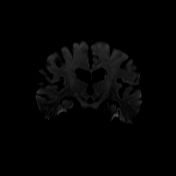
[im 64/64]
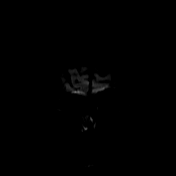

[Series 14: DWI · coronal · 5.0mm · 1.31mm/px · 1 of 32 slices shown (4 of 4)]
[im 1/32]
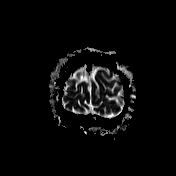

[Series 15: T2 post-contrast · coronal · 5.0mm · 0.57mm/px · 1 of 26 slices shown]
[im 1/26]
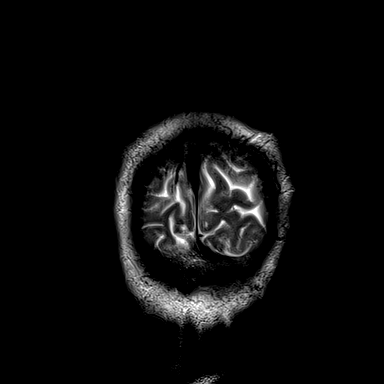

[Series 16: T1 post-contrast · axial · 1.0mm · 0.94mm/px · z∈[-18,+134]mm · 7 of 160 slices shown (1 of 3)]
[im 1/160]
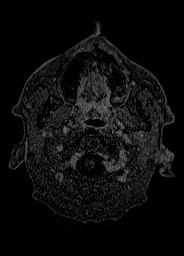
[im 27/160]
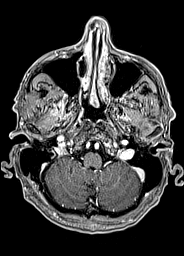
[im 54/160]
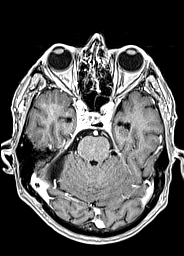
[im 80/160]
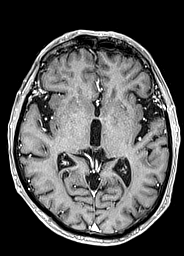
[im 107/160]
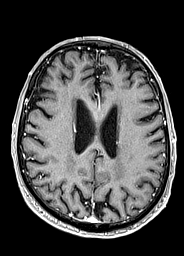
[im 133/160]
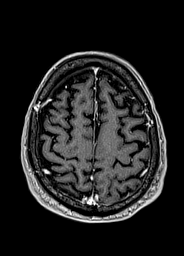
[im 160/160]
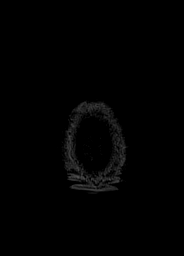

[Series 17: T1 · sagittal · 4.0mm · 0.94mm/px · 1 of 30 slices shown (3 of 4)]
[im 1/30]
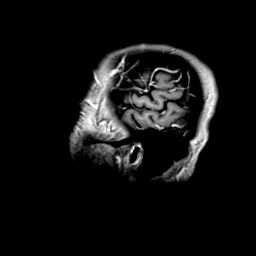

[Series 18: T1 · coronal · 4.0mm · 0.94mm/px · 1 of 30 slices shown (4 of 4)]
[im 1/30]
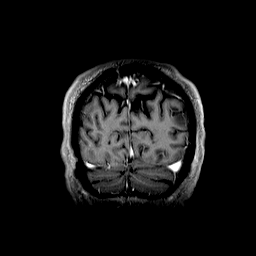

[Series 19: T1 post-contrast · coronal · 5.0mm · 0.43mm/px · 1 of 26 slices shown (2 of 3)]
[im 1/26]
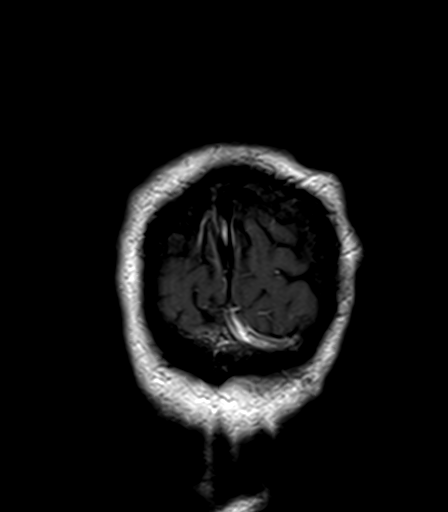

[Series 20: T1 post-contrast · sagittal · 5.0mm · 0.75mm/px · 1 of 24 slices shown (3 of 3)]
[im 1/24]
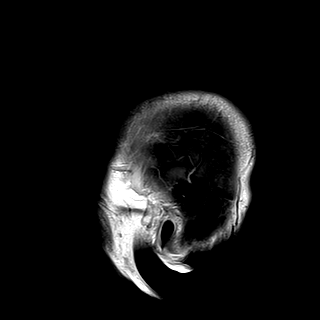

[41 of 48 positions shown; findings below may reference images not displayed]

FINDINGS: Brain: No acute infarction, hemorrhage, hydrocephalus, extra-axial
collection or mass lesion. No evidence of meningitis. Age normal
brain volume. Remote hemorrhage or calcification in the
paramediastinal left parietal lobe. Mild for age chronic small
vessel ischemia the cerebral white matter. Small remote inferior
right cerebellar infarction. Enhancement at the superficial right
internal auditory canal is a vascular loop (see T2 weighted
imaging).

Vascular: Normal flow voids.

Skull and upper cervical spine: Normal marrow signal.

Sinuses/Orbits: Negative.
IMPRESSION: No evidence of CNS infection.

## 2020-03-06 MED ORDER — GADOBUTROL 1 MMOL/ML IV SOLN
6.0000 mL | Freq: Once | INTRAVENOUS | Status: AC | PRN
  Administered 2020-03-06: 6 mL via INTRAVENOUS

## 2020-03-07 ENCOUNTER — Ambulatory Visit: Payer: Medicare HMO | Admitting: Infectious Diseases

## 2020-03-07 VITALS — BP 121/71 | HR 71 | Temp 98.0°F | Wt 130.0 lb

## 2020-03-07 DIAGNOSIS — A430 Pulmonary nocardiosis: Secondary | ICD-10-CM | POA: Diagnosis not present

## 2020-03-07 DIAGNOSIS — Z5181 Encounter for therapeutic drug level monitoring: Secondary | ICD-10-CM | POA: Diagnosis not present

## 2020-03-07 NOTE — Progress Notes (Signed)
[facility]                                                             [address], [place], [place], [address]                                                                  Phn. 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 Resumed on 10/18 BMP Today. If K looks OK, can go upto 1 DS tab PO TID Nursing visit in 2 weeks for repeat BMP Follow up with me in 4 -6 weeks   All questions and concerns were discussed and addressed. Patient and wife verbalised understanding of the plan  I spent greater than 25 minutes with the patient including greater than 50% of time in face to face counsel of the patient and in coordination of their care.   Odette Fraction, MD [facility]  Office phone [phone] Fax no. [phone] ______________________________________________________________________________________________________________________  HPI: Patient is here for follow up for Pulmonary Nocardia. Bactrim was stopped on 10/6 given K of 6/Na 129. It was resumed on 10/18 after repeat  NA and K were WNL. Losartan has been stopped as well as his BP is in the lower side. He has started taking bactrim since 10/18 and denies any side effects like nausea, vomiting, abdominal pain, diarrhea and rashes/itching. Discussed about MRI brain results being unremarkable. Wife wants to do BMP more often so that she does not have to end up going to the ED.   Patient says cough is not much. Few bouts in the day. It  does not bother him much. It is non productive and has not been able to being a sputum sample I requested last visit. He feels SOB when he walks in a hill nearby their house and thinks it is age related. He overall feels the same.   ROS: 11 point ROS negative except as stated above  Past Medical History:  Diagnosis Date  . Enlarged prostate   . Hypertension   . Renal stones    Past Surgical History:  Procedure Laterality Date  . APPENDECTOMY    . CHOLECYSTECTOMY    . NOSE SURGERY    . SPLENECTOMY, TOTAL     Current Outpatient Medications on File Prior to Visit  Medication  Sig Dispense Refill  . acetaminophen (TYLENOL) 325 MG tablet Take 325-650 mg by mouth every 6 (six) hours as needed for moderate pain.     Marland Kitchen Alum Hydroxide-Mag Carbonate (ACID GONE ANTACID PO) Take 1-2 tablets by mouth daily as needed (indigestion).    . sodium chloride HYPERTONIC 3 % nebulizer solution Take by nebulization as needed for other. (Patient taking differently: Take 4 mLs by nebulization as needed for other or cough. ) 750 mL 12  . sulfamethoxazole-trimethoprim (BACTRIM DS) 800-160 MG tablet Take 2 tablets by mouth 2 (two) times daily. 120 tablet 2  . aspirin 81 MG chewable tablet Chew 81 mg by mouth daily.  (Patient not taking: Reported on 03/07/2020)    . Cholecalciferol 10 MCG (400 UNIT) CAPS Take 400 Units by mouth daily.  (Patient not taking: Reported on 03/07/2020)    . losartan (COZAAR) 50 MG tablet Take 50 mg by mouth daily. (Patient not taking: Reported on 03/07/2020)    . polyethylene glycol powder (GLYCOLAX/MIRALAX) 17 GM/SCOOP powder Take 0.5 Containers by mouth daily as needed for moderate constipation.  (Patient not taking: Reported on 03/07/2020)    . Respiratory Therapy Supplies (FLUTTER) DEVI 1 puff by Does not apply route in the morning and at bedtime. (Patient not taking: Reported on 03/07/2020) 1 each 0  . sodium chloride (OCEAN) 0.65 % nasal spray Place 1 spray into the nose daily as  needed for congestion.  (Patient not taking: Reported on 03/07/2020)     No current facility-administered medications on file prior to visit.   Allergies  Allergen Reactions  . Codeine    Social History   Socioeconomic History  . Marital status: Married    Spouse name: Not on file  . Number of children: Not on file  . Years of education: Not on file  . Highest education level: Not on file  Occupational History  . Not on file  Tobacco Use  . Smoking status: Former Smoker    Packs/day: 1.00    Years: 30.00    Pack years: 30.00    Types: Cigarettes    Quit date: 10/07/1984    Years since quitting: 35.4  . Smokeless tobacco: Never Used  Substance and Sexual Activity  . Alcohol use: Never  . Drug use: Never  . Sexual activity: Not on file  Other Topics Concern  . Not on file  Social History Narrative  . Not on file   Social Determinants of Health   Financial Resource Strain:   . Difficulty of Paying Living Expenses: Not on file  Food Insecurity:   . Worried About Programme researcher, broadcasting/film/video in the Last Year: Not on file  . Ran Out of Food in the Last Year: Not on file  Transportation Needs:   . Lack of Transportation (Medical): Not on file  . Lack of Transportation (Non-Medical): Not on file  Physical Activity:   . Days of Exercise per Week: Not on file  . Minutes of Exercise per Session: Not on file  Stress:   . Feeling of Stress : Not on file  Social Connections:   . Frequency of Communication with Friends and Family: Not on file  . Frequency of Social Gatherings with Friends and Family: Not on file  . Attends Religious Services: Not on file  . Active Member of Clubs or Organizations: Not on file  . Attends Banker Meetings: Not on file  . Marital Status: Not on file  Intimate Partner Violence:   .  Fear of Current or Ex-Partner: Not on file  . Emotionally Abused: Not on file  . Physically Abused: Not on file  . Sexually Abused: Not on file     Vitals BP 121/71   Pulse 71   Temp 98 F (36.7 C) (Oral)   Wt 130 lb (59 kg)   BMI 20.36 kg/m    Examination  General - not in acute distress, comfortably sitting in chair, HARD OF HEARING, THIN  HEENT - PEERLA, no pallor and no icterus Chest - b/l clear air entry, CLEAR LUNGS CVS- Normal s1s2, RRR Abdomen - Soft, Non tender , non distended Ext- no pedal edema Neuro: grossly normal Back - WNL Psych : calm and cooperative   Recent labs CBC Latest Ref Rng & Units 02/20/2020 02/19/2020 09/14/2019  WBC 4.0 - 10.5 K/uL 5.0 5.9 7.9  Hemoglobin 13.0 - 17.0 g/dL 16.114.1 09.614.4 04.514.1  Hematocrit 39 - 52 % 41.0 41.5 42.5  Platelets 150 - 400 K/uL 342 336 358   CMP Latest Ref Rng & Units 02/26/2020 02/20/2020 02/19/2020  Glucose 65 - 99 mg/dL 76 97 81  BUN 7 - 25 mg/dL 23 15 19   Creatinine 0.70 - 1.11 mg/dL 4.090.95 8.111.12 9.14(N1.23(H)  Sodium 135 - 146 mmol/L 138 124(L) 129(L)  Potassium 3.5 - 5.3 mmol/L 5.0 4.9 6.0(H)  Chloride 98 - 110 mmol/L 101 92(L) 95(L)  CO2 20 - 32 mmol/L 30 22 27   Calcium 8.6 - 10.3 mg/dL 9.7 9.0 9.7  Total Protein 6.1 - 8.1 g/dL - - 7.1  Total Bilirubin 0.2 - 1.2 mg/dL - - 0.4  Alkaline Phos 38 - 126 U/L - - -  AST 10 - 35 U/L - - 24  ALT 9 - 46 U/L - - 13     Pertinent Microbiology Results for orders placed or performed in visit on 11/06/19  AFB Culture & Smear     Status: Abnormal   Collection Time: 11/06/19  3:14 PM   Specimen: Sputum   Sputum  Result Value Ref Range Status   AFB Specimen Processing Concentration  Final   Acid Fast Smear Negative  Final   Acid Fast Culture Positive (A)  Final    Comment: 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     Pertinent Imaging Chest Xray 11/22/2019   FINDINGS: The heart size and mediastinal contours are within normal limits. Normal pulmonary vascularity. The lungs are hyperinflated. Patchy reticulonodular opacities throughout both lungs, greater on the right. More focal opacity at the right lung base and in the subpleural left upper lobe. No pleural effusion or pneumothorax. No acute osseous abnormality.  IMPRESSION: 1. Patchy reticulonodular opacities throughout both lungs, greater on the right, suspicious for chronic atypical infection.   CTAPE 09/17/19 FINDINGS:  # Pulmonary arteries:No evidence of pulmonary embolism.   # Aorta:No evidence of dissection or other acute pathology.   # Mediastinum: Heart and thoracic lymph nodes are normal in size.   # Abdomen:No acute findings in the visible abdominal structures.   # Lungs:There are multiple small foci of patchy opacities bilaterally are without significant change since prior study. Bronchiectasis and  mucous plugging are also present bilaterally greatest in the lower lobes. Micronodular infiltrate in the posterior right lower lobe redemonstrated on image 108 series 5, lung windows. Small portion of the right lower lobe in the costophrenic gutter is also partially consolidated as it was previously. No pleural effusions or pneumothoraces. No concerning nodules.   # MSK:No acute or aggressive bony abnormalities.    IMPRESSION:  1. No evidence of pulmonary embolism.  2. Multiple bilateral patchy opacities, bronchiectasis, wall thickening and micronodular infiltrate in the right lower lobe consistent with chronic infection such as MAI   MRI Brain WWO contrast 03/06/20  FINDINGS: Brain: No acute infarction, hemorrhage, hydrocephalus, extra-axial collection or mass lesion. No evidence of meningitis. Age normal brain volume. Remote hemorrhage or calcification in  the paramediastinal left parietal lobe. Mild for age chronic small vessel ischemia the cerebral white matter. Small remote inferior right cerebellar infarction. Enhancement at the superficial right internal auditory canal is a vascular loop (see T2 weighted imaging).  Vascular: Normal flow voids.  Skull and upper cervical spine: Normal marrow signal.  Sinuses/Orbits: Negative.  IMPRESSION: No evidence of CNS infection.  All pertinent labs/Imagings/notes reviewed. All pertinent plain films and CT images have been personally visualized and interpreted; radiology reports have been reviewed. Decision making incorporated into the Impression / Recommendations.

## 2020-03-07 NOTE — Assessment & Plan Note (Signed)
Continue Bactrim 1 DS tab PO BID If k from today look OK, may increase from BID to TID dosing FU in 4-6 weeks

## 2020-03-08 LAB — BASIC METABOLIC PANEL
BUN/Creatinine Ratio: 15 (calc) (ref 6–22)
BUN: 19 mg/dL (ref 7–25)
CO2: 28 mmol/L (ref 20–32)
Calcium: 10.3 mg/dL (ref 8.6–10.3)
Chloride: 101 mmol/L (ref 98–110)
Creat: 1.29 mg/dL — ABNORMAL HIGH (ref 0.70–1.11)
Glucose, Bld: 73 mg/dL (ref 65–99)
Potassium: 4.9 mmol/L (ref 3.5–5.3)
Sodium: 135 mmol/L (ref 135–146)

## 2020-03-09 ENCOUNTER — Ambulatory Visit (INDEPENDENT_AMBULATORY_CARE_PROVIDER_SITE_OTHER): Payer: Medicare HMO

## 2020-03-09 ENCOUNTER — Encounter (HOSPITAL_COMMUNITY): Payer: Self-pay | Admitting: *Deleted

## 2020-03-09 ENCOUNTER — Other Ambulatory Visit: Payer: Self-pay

## 2020-03-09 ENCOUNTER — Ambulatory Visit (HOSPITAL_COMMUNITY)
Admission: EM | Admit: 2020-03-09 | Discharge: 2020-03-09 | Disposition: A | Discharge status: Home / Self Care | Payer: Medicare HMO | Attending: Family Medicine | Admitting: Family Medicine

## 2020-03-09 DIAGNOSIS — W19XXXA Unspecified fall, initial encounter: Secondary | ICD-10-CM | POA: Diagnosis not present

## 2020-03-09 DIAGNOSIS — R0781 Pleurodynia: Secondary | ICD-10-CM

## 2020-03-09 DIAGNOSIS — S20212A Contusion of left front wall of thorax, initial encounter: Secondary | ICD-10-CM | POA: Diagnosis not present

## 2020-03-09 IMAGING — DX DG RIBS W/ CHEST 3+V*L*
3 series · 3 of 3 positions shown · non-contrast
Comparison: CT [DATE], radiograph [DATE]

CLINICAL DATA: Fall

EXAM:
LEFT RIBS AND CHEST - 3+ VIEW

[chest pa]
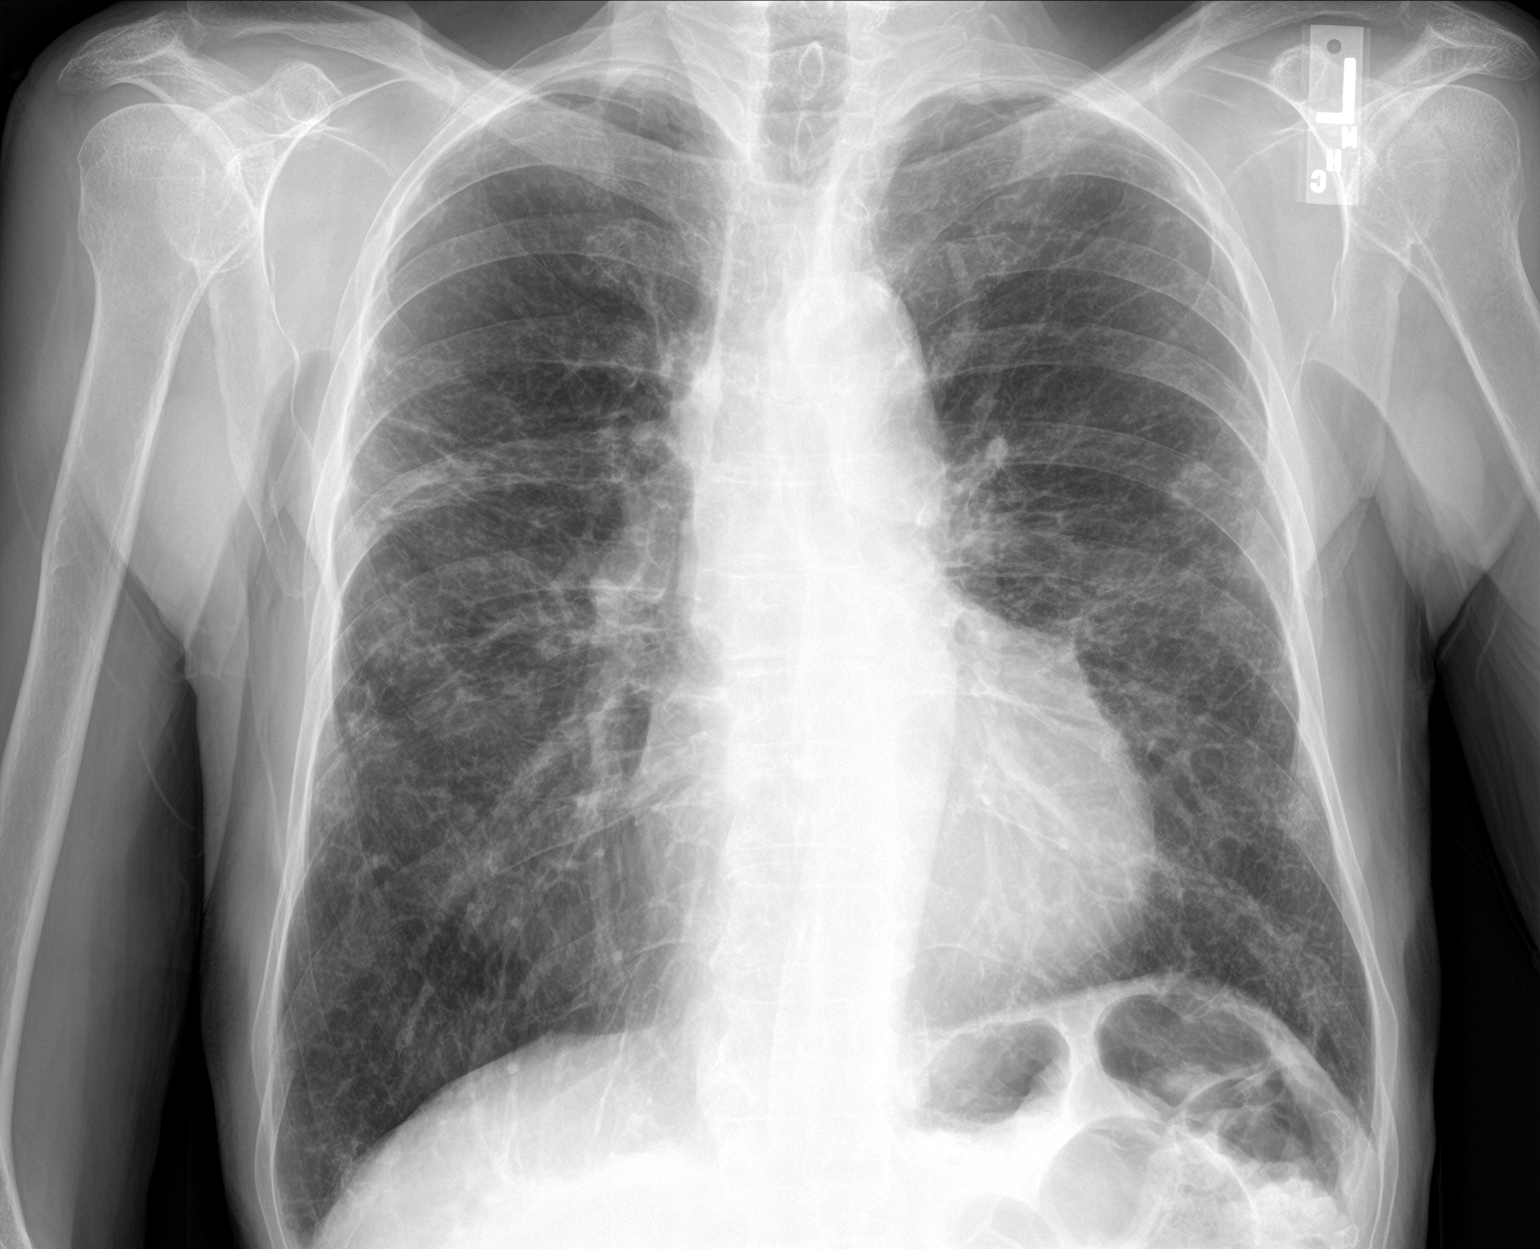

[rib obl]
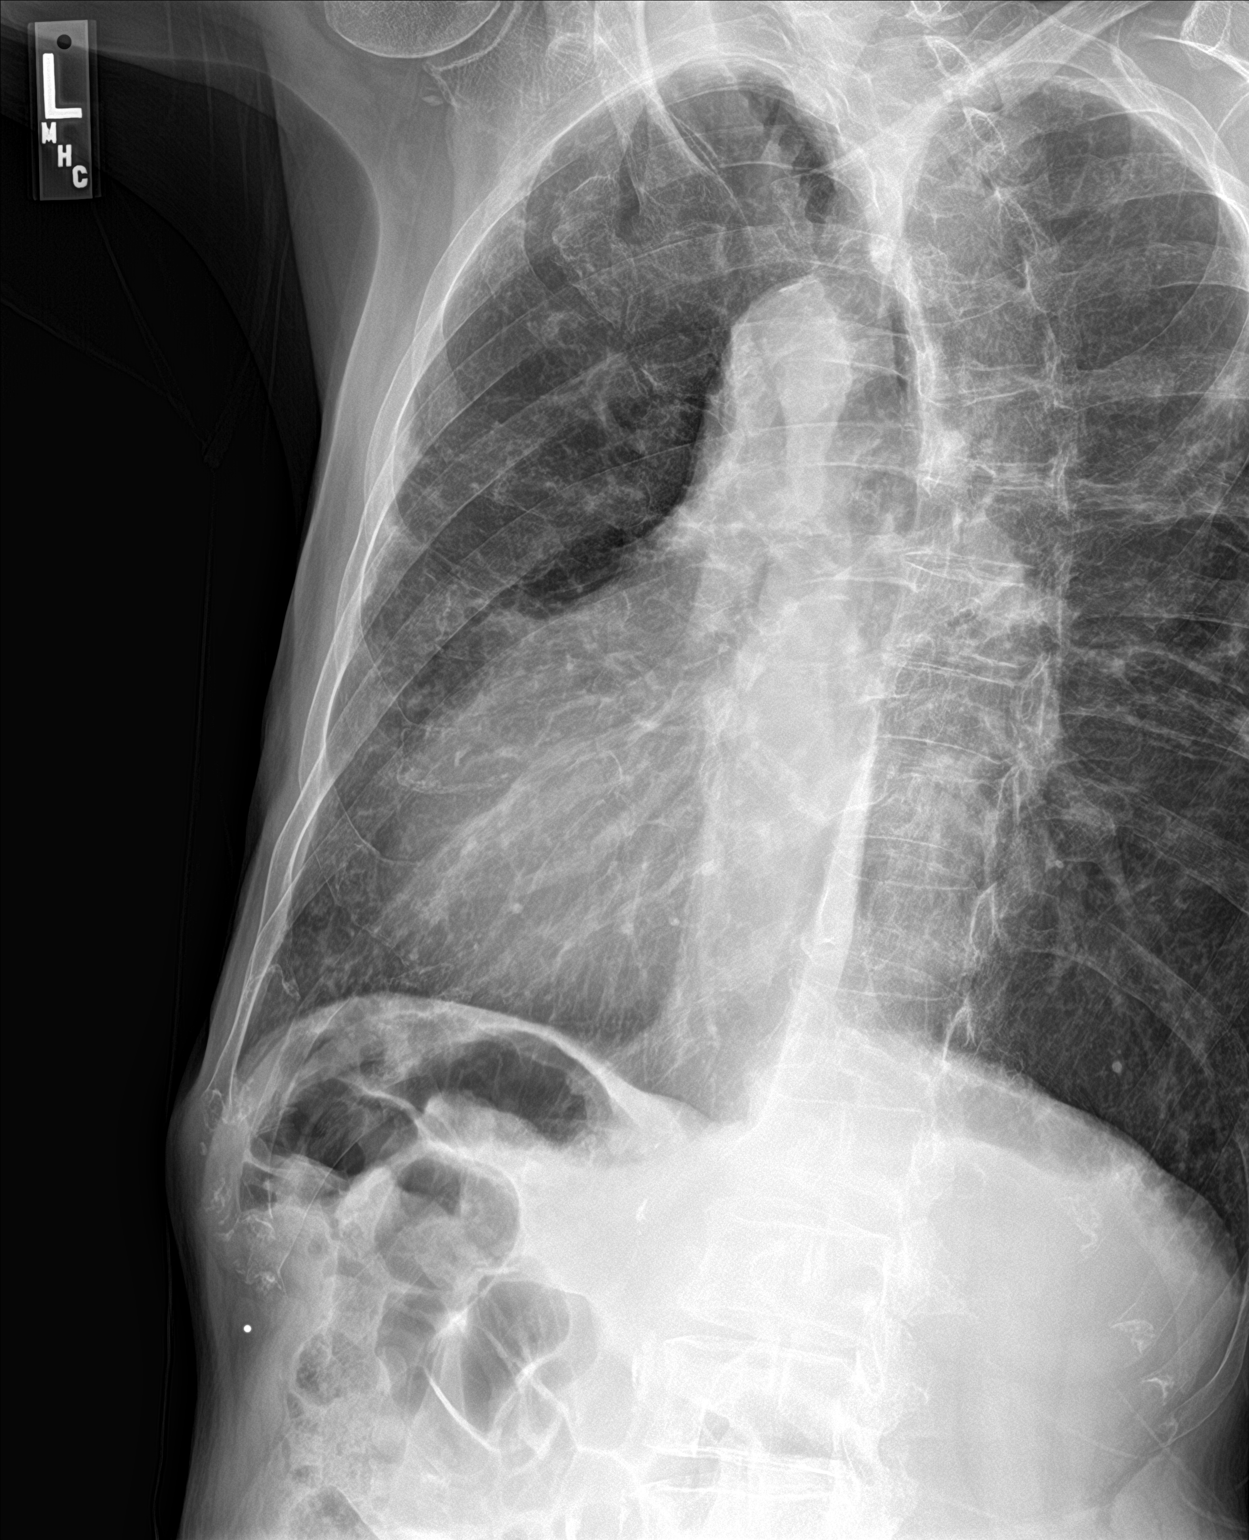

[rib pa]
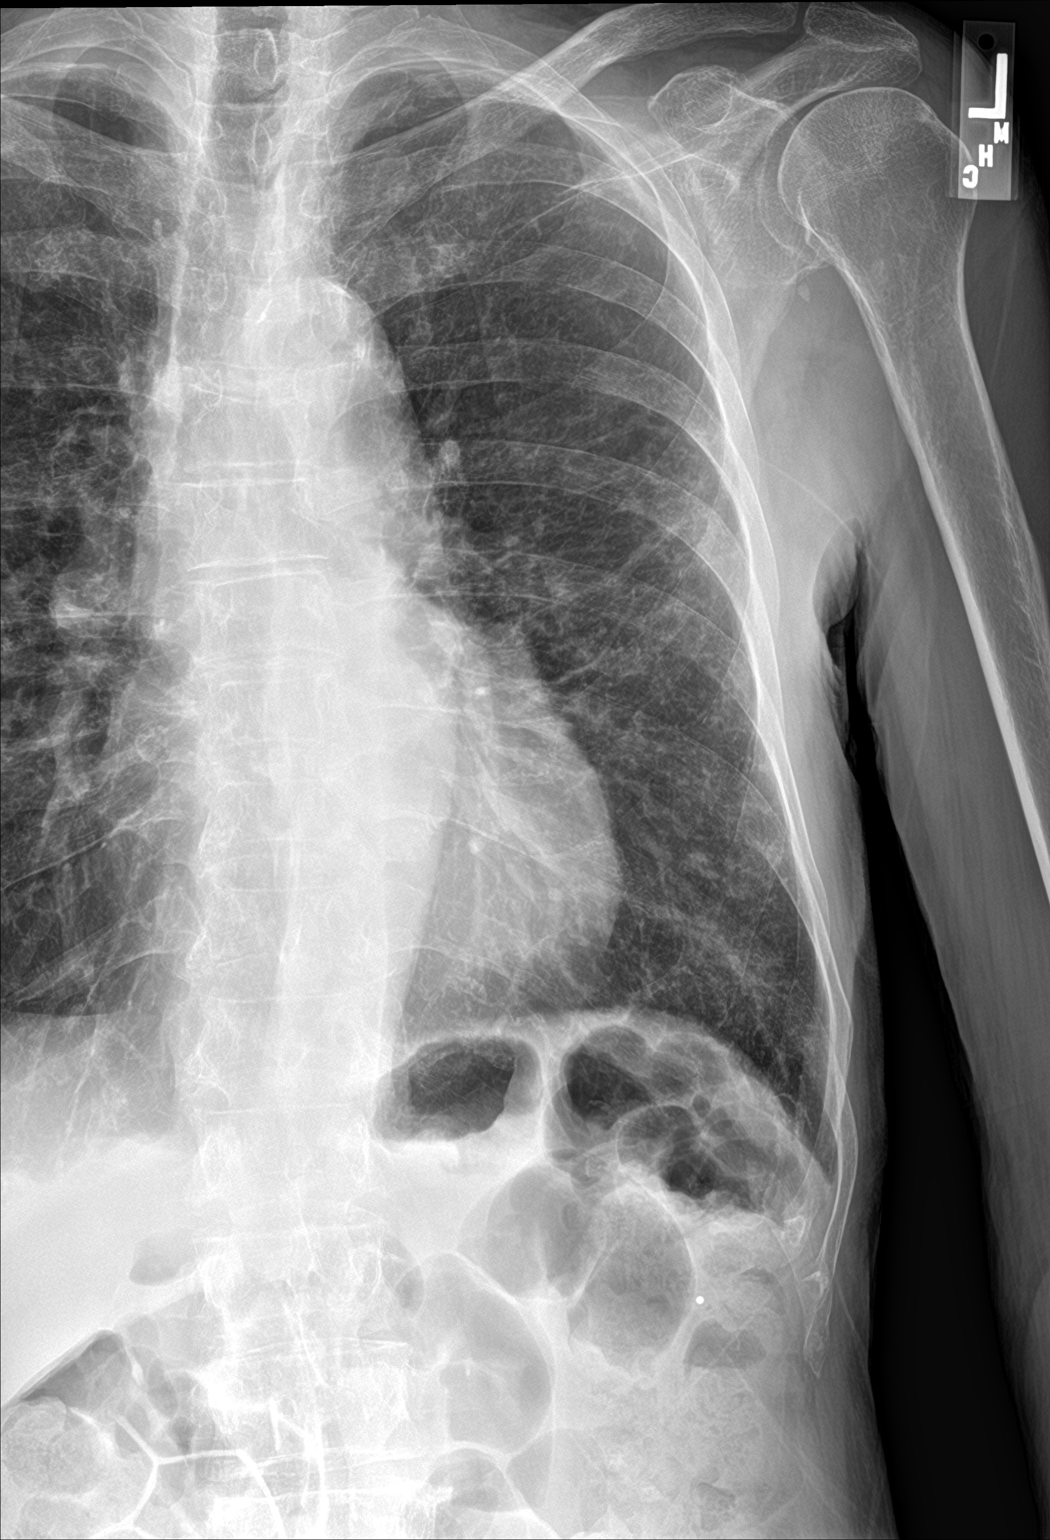

[3 of 3 positions shown; findings below may reference images not displayed]

FINDINGS: Normal cardiac silhouette. Lungs are hyperinflated. Bilateral
pulmonary nodule similar comparison CT.

Dedicated views of the LEFT ribs demonstrate no fracture. No
pneumothorax.
IMPRESSION: 1. No LEFT rib fracture.  No pneumothorax.
2. Stable bilateral pulmonary nodules similar comparison CT

## 2020-03-09 NOTE — ED Triage Notes (Signed)
Pt reports losing his balance when getting off toilet early this AM, causing him to fall.  Denies any head injury or LOC.  C/O pain to left distal rib and LUQ abd area.  LUQ very tender to light palpation.

## 2020-03-09 NOTE — ED Provider Notes (Signed)
[facility]   MRN: [id number] DOB: 1930-09-28  Subjective:   Samuel Mata is a 86 y.o. male presenting for suffering an accidental fall today. Patient lost his balance and fell causing him to make impact on his left flank, left abdominal side. Denies head injury, loc, confusion, chest pain, shob, bloody stools.   No current facility-administered medications for this encounter.  Current Outpatient Medications:  .  acetaminophen (TYLENOL) 325 MG tablet, Take 325-650 mg by mouth every 6 (six) hours as needed for moderate pain. , Disp: , Rfl:  .  aspirin 81 MG chewable tablet, Chew 81 mg by mouth daily. , Disp: , Rfl:  .  sulfamethoxazole-trimethoprim (BACTRIM DS) 800-160 MG tablet, Take 2 tablets by mouth 2 (two) times daily., Disp: 120 tablet, Rfl: 2 .  Alum Hydroxide-Mag Carbonate (ACID GONE ANTACID PO), Take 1-2 tablets by mouth daily as needed (indigestion)., Disp: , Rfl:  .  Cholecalciferol 10 MCG (400 UNIT) CAPS, Take 400 Units by mouth daily.  (Patient not taking: Reported on 03/07/2020), Disp: , Rfl:  .  polyethylene glycol powder (GLYCOLAX/MIRALAX) 17 GM/SCOOP powder, Take 0.5 Containers by mouth daily as needed for moderate constipation.  (Patient not taking: Reported on 03/07/2020), Disp: , Rfl:  .  Respiratory Therapy Supplies (FLUTTER) DEVI, 1 puff by Does not apply route in the morning and at bedtime. (Patient not taking: Reported on 03/07/2020), Disp: 1 each, Rfl: 0 .  sodium chloride (OCEAN) 0.65 % nasal spray, Place 1 spray into the nose daily as needed for congestion.  (Patient not taking: Reported on 03/07/2020), Disp: , Rfl:  .  sodium chloride HYPERTONIC 3 % nebulizer solution, Take by nebulization as needed for other. (Patient taking differently: Take 4 mLs by nebulization as needed for other or cough. ), Disp: 750 mL, Rfl: 12   Allergies  Allergen Reactions  . Codeine     Past Medical History:  Diagnosis Date  . Enlarged prostate   .  Hypertension   . Renal stones      Past Surgical History:  Procedure Laterality Date  . APPENDECTOMY    . CHOLECYSTECTOMY    . NOSE SURGERY    . SPLENECTOMY, TOTAL      Family History  Problem Relation Age of Onset  . Lung disease Neg Hx     Social History   Tobacco Use  . Smoking status: Former Smoker    Packs/day: 1.00    Years: 30.00    Pack years: 30.00    Types: Cigarettes    Quit date: 10/07/1984    Years since quitting: 35.4  . Smokeless tobacco: Never Used  Substance Use Topics  . Alcohol use: Never  . Drug use: Never    ROS   Objective:   Vitals: BP (!) 149/78   Pulse 65   Temp 97.7 F (36.5 C) (Oral)   Resp 18   SpO2 97%   Physical Exam Constitutional:      General: He is not in acute distress.    Appearance: Normal appearance. He is well-developed and normal weight. He is not ill-appearing, toxic-appearing or diaphoretic.  HENT:     Head: Normocephalic and atraumatic.     Right Ear: External ear normal.     Left Ear: External ear normal.     Nose: Nose normal.     Mouth/Throat:     Pharynx: Oropharynx is clear.  Eyes:     General: No scleral icterus.  Right eye: No discharge.        Left eye: No discharge.     Extraocular Movements: Extraocular movements intact.     Pupils: Pupils are equal, round, and reactive to light.  Cardiovascular:     Rate and Rhythm: Normal rate.  Pulmonary:     Effort: Pulmonary effort is normal.  Chest:    Abdominal:     General: There is no distension.     Palpations: Abdomen is soft.     Tenderness: There is no guarding or rebound.  Musculoskeletal:     Cervical back: Normal range of motion.  Skin:    Findings: No bruising or rash.  Neurological:     Mental Status: He is alert and oriented to person, place, and time.  Psychiatric:        Mood and Affect: Mood normal.        Behavior: Behavior normal.        Thought Content: Thought content normal.        Judgment: Judgment normal.      DG Ribs Unilateral W/Chest Left  Result Date: 03/09/2020 CLINICAL DATA:  Fall EXAM: LEFT RIBS AND CHEST - 3+ VIEW COMPARISON:  CT 09/17/2019, radiograph 7 8 FINDINGS: Normal cardiac silhouette. Lungs are hyperinflated. Bilateral pulmonary nodule similar comparison CT. Dedicated views of the LEFT ribs demonstrate no fracture. No pneumothorax. IMPRESSION: 1. No LEFT rib fracture.  No pneumothorax. 2. Stable bilateral pulmonary nodules similar comparison CT Electronically Signed   By: Genevive Bi M.D.   On: 03/09/2020 18:35     Assessment and Plan :   PDMP not reviewed this encounter.  1. Rib contusion, left, initial encounter   2. Rib pain   3. Fall, initial encounter     86 y/o male with focal chest wall/superficial tenderness as outlined above. Chest x-ray very reassuring. Will manage conservatively with APAP for pain, rib contusion. Recommended some rest from his regular exercise/walking for the next 2 days. Counseled patient on potential for adverse effects with medications prescribed/recommended today, ER and return-to-clinic precautions discussed, patient verbalized understanding.    Wallis Bamberg, [place] 03/10/20 [phone]

## 2020-03-09 NOTE — Discharge Instructions (Signed)
Do not use any nonsteroidal anti-inflammatories (NSAIDs) like ibuprofen, Motrin, naproxen, Aleve, etc. which are all available over-the-counter.  Please just use Tylenol at a dose of 500mg-650mg once every 6 hours as needed for your aches, pains, fevers. 

## 2020-03-09 NOTE — ED Notes (Signed)
Patient and family (caregiver) in lobby.  Reports he was up to the toilet.  Around 4 am fell, no loc.  Reports striking left lower ribs/left upper abdomen.  No visible bruise or marking on patient.   Notified dr Delton See, xray ordered.

## 2020-03-10 ENCOUNTER — Telehealth: Payer: Self-pay | Admitting: *Deleted

## 2020-03-10 ENCOUNTER — Other Ambulatory Visit: Payer: Self-pay | Admitting: Infectious Diseases

## 2020-03-10 DIAGNOSIS — N179 Acute kidney failure, unspecified: Secondary | ICD-10-CM

## 2020-03-10 DIAGNOSIS — E875 Hyperkalemia: Secondary | ICD-10-CM

## 2020-03-10 NOTE — Telephone Encounter (Signed)
-----   Message from Odette Fraction, MD sent at 03/10/2020  8:06 AM EDT ----- Please let patient know to decrease Bactrim DS 1 tab PO BID to 1 tab PO daily given mild elevation in Cr Take plenty of fluids Repeat BMP on Friday 10/29

## 2020-03-10 NOTE — Telephone Encounter (Signed)
Relayed to [place], patient's wife.  He will take bactrim once daily, in the mornings. He has already taken bactrim today.  He has a little bit of nausea, will let us know if this gets worse.  The lab appointment is scheduled for 10/29 10:00. Andree Coss, RN

## 2020-03-10 NOTE — Progress Notes (Signed)
Please let patient know to decrease Bactrim DS 1 tab PO BID to 1 tab PO daily given mild elevation in Cr Take plenty of fluids Repeat BMP on Friday 10/29

## 2020-03-14 ENCOUNTER — Other Ambulatory Visit: Payer: Self-pay

## 2020-03-14 ENCOUNTER — Other Ambulatory Visit: Payer: Medicare HMO

## 2020-03-14 DIAGNOSIS — N179 Acute kidney failure, unspecified: Secondary | ICD-10-CM

## 2020-03-15 LAB — BASIC METABOLIC PANEL
BUN/Creatinine Ratio: 11 (calc) (ref 6–22)
BUN: 13 mg/dL (ref 7–25)
CO2: 26 mmol/L (ref 20–32)
Calcium: 10.3 mg/dL (ref 8.6–10.3)
Chloride: 94 mmol/L — ABNORMAL LOW (ref 98–110)
Creat: 1.14 mg/dL — ABNORMAL HIGH (ref 0.70–1.11)
Glucose, Bld: 89 mg/dL (ref 65–99)
Potassium: 5.4 mmol/L — ABNORMAL HIGH (ref 3.5–5.3)
Sodium: 128 mmol/L — ABNORMAL LOW (ref 135–146)

## 2020-03-18 ENCOUNTER — Other Ambulatory Visit: Payer: Self-pay

## 2020-03-18 ENCOUNTER — Other Ambulatory Visit: Payer: Medicare HMO

## 2020-03-18 ENCOUNTER — Telehealth: Payer: Self-pay

## 2020-03-18 DIAGNOSIS — N179 Acute kidney failure, unspecified: Secondary | ICD-10-CM

## 2020-03-18 DIAGNOSIS — E875 Hyperkalemia: Secondary | ICD-10-CM

## 2020-03-18 NOTE — Progress Notes (Signed)
Patient needs a repeat BMP today. Please let him know that his Na is low , 128 and K is in the higher range 5.4. Cr has some decreased to 1.1 from last value of 1.29.

## 2020-03-18 NOTE — Telephone Encounter (Signed)
No

## 2020-03-18 NOTE — Addendum Note (Signed)
Addended by: Harley Alto on: 03/18/2020 10:47 AM   Modules accepted: Orders

## 2020-03-18 NOTE — Telephone Encounter (Signed)
Patient informed of lab results and verbalized understanding. Patient is scheduled for labs today. Does he still need labs on 03/21/20?

## 2020-03-18 NOTE — Telephone Encounter (Signed)
-----   Message from Odette Fraction, MD sent at 03/18/2020  7:34 AM EDT ----- Patient needs a repeat BMP today. Please let him know that his Na is low , 128 and K is in the higher range 5.4. Cr has some decreased to 1.1 from last value of 1.29.

## 2020-03-19 ENCOUNTER — Other Ambulatory Visit: Payer: Self-pay

## 2020-03-19 ENCOUNTER — Telehealth: Payer: Self-pay

## 2020-03-19 DIAGNOSIS — E875 Hyperkalemia: Secondary | ICD-10-CM

## 2020-03-19 DIAGNOSIS — N179 Acute kidney failure, unspecified: Secondary | ICD-10-CM

## 2020-03-19 LAB — BASIC METABOLIC PANEL
BUN: 16 mg/dL (ref 7–25)
CO2: 30 mmol/L (ref 20–32)
Calcium: 9.8 mg/dL (ref 8.6–10.3)
Chloride: 93 mmol/L — ABNORMAL LOW (ref 98–110)
Creat: 1.03 mg/dL (ref 0.70–1.11)
Glucose, Bld: 67 mg/dL (ref 65–99)
Potassium: 5 mmol/L (ref 3.5–5.3)
Sodium: 129 mmol/L — ABNORMAL LOW (ref 135–146)

## 2020-03-19 NOTE — Telephone Encounter (Signed)
Thanks

## 2020-03-19 NOTE — Telephone Encounter (Signed)
Called patient and left detailed message regarding repeat lab appointment. Provided in voicemail appointment date/time. Samuel Mata

## 2020-03-19 NOTE — Progress Notes (Signed)
Per Dr. Elinor Parkinson repeat BMP only. Samuel Mata

## 2020-03-19 NOTE — Telephone Encounter (Signed)
Thanks. He will need another BMP next Friday.

## 2020-03-19 NOTE — Telephone Encounter (Signed)
-----   Message from Odette Fraction, MD sent at 03/19/2020  7:04 AM EDT ----- Please let patient know that Na is bit better from 128 to 129. K and Cr has also normalised. Could you please check how many times is he taking the Bactrim? He was supposed to take Bactrim DS tab once a day.

## 2020-03-19 NOTE — Telephone Encounter (Signed)
Call placed to patient and spoke with his wife, [name]. Made her aware of patient's improving labs and confirmed that patient is taking antibiotic once daily. Wife very appreciative of updates. Valarie Cones

## 2020-03-21 ENCOUNTER — Other Ambulatory Visit: Payer: Medicare HMO

## 2020-03-28 ENCOUNTER — Other Ambulatory Visit: Payer: Medicare HMO

## 2020-03-28 ENCOUNTER — Other Ambulatory Visit: Payer: Self-pay

## 2020-03-28 DIAGNOSIS — E875 Hyperkalemia: Secondary | ICD-10-CM

## 2020-03-28 DIAGNOSIS — N179 Acute kidney failure, unspecified: Secondary | ICD-10-CM

## 2020-03-28 NOTE — Addendum Note (Signed)
Addended by: Harley Alto on: 03/28/2020 09:25 AM   Modules accepted: Orders

## 2020-03-29 LAB — BASIC METABOLIC PANEL
BUN/Creatinine Ratio: 17 (calc) (ref 6–22)
BUN: 19 mg/dL (ref 7–25)
CO2: 28 mmol/L (ref 20–32)
Calcium: 10 mg/dL (ref 8.6–10.3)
Chloride: 98 mmol/L (ref 98–110)
Creat: 1.15 mg/dL — ABNORMAL HIGH (ref 0.70–1.11)
Glucose, Bld: 80 mg/dL (ref 65–99)
Potassium: 4.8 mmol/L (ref 3.5–5.3)
Sodium: 133 mmol/L — ABNORMAL LOW (ref 135–146)

## 2020-03-31 ENCOUNTER — Other Ambulatory Visit: Payer: Self-pay

## 2020-03-31 ENCOUNTER — Telehealth: Payer: Self-pay

## 2020-03-31 DIAGNOSIS — E875 Hyperkalemia: Secondary | ICD-10-CM

## 2020-03-31 DIAGNOSIS — N179 Acute kidney failure, unspecified: Secondary | ICD-10-CM

## 2020-03-31 NOTE — Telephone Encounter (Signed)
Left patient voicemail regarding the need for repeat lab. If patient returns call he only needs a lab appointment within one week. Orders are in epic.  Samuel Mata

## 2020-03-31 NOTE — Telephone Encounter (Signed)
-----   Message from Odette Fraction, MD sent at 03/31/2020  1:52 PM EST ----- Will need repeat BMP innext 1 week.

## 2020-03-31 NOTE — Progress Notes (Signed)
Will need repeat BMP innext 1 week.

## 2020-03-31 NOTE — Telephone Encounter (Signed)
Patient called back, scheduled lab for 11/22, 10:00. Andree Coss, RN

## 2020-04-01 NOTE — Progress Notes (Signed)
Patient scheduled for repeat BMP 04/07/20

## 2020-04-03 NOTE — Addendum Note (Signed)
Addended by: Harley Alto on: 04/03/2020 11:05 AM   Modules accepted: Orders

## 2020-04-07 ENCOUNTER — Other Ambulatory Visit: Payer: Self-pay

## 2020-04-07 ENCOUNTER — Other Ambulatory Visit: Payer: Medicare HMO

## 2020-04-07 DIAGNOSIS — E875 Hyperkalemia: Secondary | ICD-10-CM

## 2020-04-07 DIAGNOSIS — N179 Acute kidney failure, unspecified: Secondary | ICD-10-CM

## 2020-04-08 ENCOUNTER — Telehealth: Payer: Self-pay

## 2020-04-08 LAB — BASIC METABOLIC PANEL
BUN: 17 mg/dL (ref 7–25)
CO2: 29 mmol/L (ref 20–32)
Calcium: 9.9 mg/dL (ref 8.6–10.3)
Chloride: 99 mmol/L (ref 98–110)
Creat: 1.03 mg/dL (ref 0.70–1.11)
Glucose, Bld: 74 mg/dL (ref 65–99)
Potassium: 4.8 mmol/L (ref 3.5–5.3)
Sodium: 134 mmol/L — ABNORMAL LOW (ref 135–146)

## 2020-04-08 NOTE — Telephone Encounter (Signed)
-----   Message from Odette Fraction, MD sent at 04/08/2020 10:11 AM EST ----- Labs including Creatinine looks good. Please let patient know about the results. I will see him soon on 11/29.

## 2020-04-08 NOTE — Telephone Encounter (Signed)
Patient made aware of results and reminded of upcoming appointment.  Samuel Mata  

## 2020-04-14 ENCOUNTER — Other Ambulatory Visit: Payer: Self-pay

## 2020-04-14 ENCOUNTER — Encounter: Payer: Self-pay | Admitting: Infectious Diseases

## 2020-04-14 ENCOUNTER — Inpatient Hospital Stay: Payer: Medicare HMO | Admitting: Infectious Diseases

## 2020-04-14 ENCOUNTER — Ambulatory Visit: Payer: Medicare HMO | Admitting: Infectious Diseases

## 2020-04-14 VITALS — BP 130/63 | HR 80 | Temp 98.3°F | Wt 136.0 lb

## 2020-04-14 DIAGNOSIS — A430 Pulmonary nocardiosis: Secondary | ICD-10-CM

## 2020-04-14 DIAGNOSIS — Z5181 Encounter for therapeutic drug level monitoring: Secondary | ICD-10-CM | POA: Diagnosis not present

## 2020-04-14 DIAGNOSIS — A439 Nocardiosis, unspecified: Secondary | ICD-10-CM | POA: Diagnosis not present

## 2020-04-14 NOTE — Progress Notes (Signed)
[facility]                                                             [address], [place], [place], [address]                                                                  Phn. 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 SOB stable ( only while walking uphills)  Bronchiectasis Ex Smoker  Splenectomy - patient confirms he has received all recommended vaccine for splenectomy ( H influenzae, Pneumococcal and Meningococcus vaccines)  Plan Continue Bactrim 1DS tab po daily Nursing visit in 2 weeks for repeat BMP Follow up with Pulmonology ? Induced sputum for repeat AFB sputum cx and sensi ( no sensi. available from prior sputum cx) Follow up with me in 4 -6 weeks   All questions and concerns were discussed and addressed. Patient and wife verbalised understanding of the plan  I spent greater than 25 minutes with the patient including greater than 50% of time in face to face counsel of the patient and in coordination of their care.   Odette Fraction, MD [facility]  Office phone [phone] Fax no. [phone] ______________________________________________________________________________________________________________________  HPI: Patient is here for follow up for Pulmonary Nocardia. Bactrim was initially stopped on 10/6 given K of 6/Na 129. It was resumed on 10/18 after repeat  NA and K were WNL. He has started taking bactrim since 10/18. Dosing of batrim was reduced from BID to once daily dosing given  AKI of 1.29. he has not missed any doses. He says overall he feels so better after he has started taking the bactrim and wants to continue taking it. Denies any phlegm prodcution, He really does not have a lot of SOB to being with. He feels SOB after walking uphills to which he has not noted any significant difference.   He has fallen a couple of times in the last month after feeling dizzy. He says he feels dizzy after standing from a sitting position ?Orthostatic hypotension. Discussed about preventive measures and adequate hydration. Overall feels stable.   ROS: 11 point ROS negative except as stated above  Past Medical History:  Diagnosis Date  . Enlarged prostate   . Hypertension   . Renal stones    Past Surgical  History:  Procedure Laterality Date  . APPENDECTOMY    . CHOLECYSTECTOMY    . NOSE SURGERY    . SPLENECTOMY, TOTAL     Current Outpatient Medications on File Prior to Visit  Medication Sig Dispense Refill  . losartan (COZAAR) 50 MG tablet Take 1 tablet by mouth daily.    Marland Kitchen acetaminophen (TYLENOL) 325 MG tablet Take 325-650 mg by mouth every 6 (six) hours as needed for moderate pain.     Marland Kitchen Alum Hydroxide-Mag Carbonate (ACID GONE ANTACID PO) Take 1-2 tablets by mouth daily as needed (indigestion).    Marland Kitchen aspirin 81 MG EC tablet Take by mouth.    . calcium carbonate (OS-CAL) 1250 (500 Ca) MG chewable tablet Chew by mouth.    . Cholecalciferol 10 MCG (400 UNIT) CAPS Take 400 Units by mouth daily.  (Patient not taking: Reported on 03/07/2020)    . polyethylene glycol powder (GLYCOLAX/MIRALAX) 17 GM/SCOOP powder Take 0.5 Containers by mouth daily as needed for moderate constipation.  (Patient not taking: Reported on 03/07/2020)    . Respiratory Therapy Supplies (FLUTTER) DEVI 1 puff by Does not apply route in the morning and at bedtime. (Patient not taking: Reported on 03/07/2020) 1 each 0  . sodium chloride (OCEAN) 0.65 % nasal spray Place 1 spray into the nose daily as needed for  congestion.  (Patient not taking: Reported on 03/07/2020)    . sodium chloride HYPERTONIC 3 % nebulizer solution Take by nebulization as needed for other. (Patient taking differently: Take 4 mLs by nebulization as needed for other or cough. ) 750 mL 12  . sulfamethoxazole-trimethoprim (BACTRIM DS) 800-160 MG tablet Take 2 tablets by mouth 2 (two) times daily. 120 tablet 2   No current facility-administered medications on file prior to visit.   Allergies  Allergen Reactions  . Codeine    Social History   Socioeconomic History  . Marital status: Married    Spouse name: Not on file  . Number of children: Not on file  . Years of education: Not on file  . Highest education level: Not on file  Occupational History  . Not on file  Tobacco Use  . Smoking status: Former Smoker    Packs/day: 1.00    Years: 30.00    Pack years: 30.00    Types: Cigarettes    Quit date: 10/07/1984    Years since quitting: 35.5  . Smokeless tobacco: Never Used  Substance and Sexual Activity  . Alcohol use: Never  . Drug use: Never  . Sexual activity: Not on file  Other Topics Concern  . Not on file  Social History Narrative  . Not on file   Social Determinants of Health   Financial Resource Strain:   . Difficulty of Paying Living Expenses: Not on file  Food Insecurity:   . Worried About Programme researcher, broadcasting/film/video in the Last Year: Not on file  . Ran Out of Food in the Last Year: Not on file  Transportation Needs:   . Lack of Transportation (Medical): Not on file  . Lack of Transportation (Non-Medical): Not on file  Physical Activity:   . Days of Exercise per Week: Not on file  . Minutes of Exercise per Session: Not on file  Stress:   . Feeling of Stress : Not on file  Social Connections:   . Frequency of Communication with Friends and Family: Not on file  . Frequency of Social Gatherings with Friends and Family: Not on file  .  Attends Religious Services: Not on file  . Active Member of Clubs or  Organizations: Not on file  . Attends Banker Meetings: Not on file  . Marital Status: Not on file  Intimate Partner Violence:   . Fear of Current or Ex-Partner: Not on file  . Emotionally Abused: Not on file  . Physically Abused: Not on file  . Sexually Abused: Not on file    Vitals There were no vitals taken for this visit.   Examination  General - not in acute distress, comfortably sitting in chair, HARD OF HEARING, THIN  HEENT - PEERLA, no pallor and no icterus Chest - b/l clear air entry, CLEAR LUNGS CVS- Normal s1s2, RRR Abdomen - Soft, Non tender , non distended Ext- no pedal edema Neuro: grossly normal Back - WNL Psych : calm and cooperative   Recent labs CBC Latest Ref Rng & Units 02/20/2020 02/19/2020 09/14/2019  WBC 4.0 - 10.5 K/uL 5.0 5.9 7.9  Hemoglobin 13.0 - 17.0 g/dL 42.5 95.6 38.7  Hematocrit 39 - 52 % 41.0 41.5 42.5  Platelets 150 - 400 K/uL 342 336 358   CMP Latest Ref Rng & Units 04/07/2020 03/28/2020 03/18/2020  Glucose 65 - 99 mg/dL 74 80 67  BUN 7 - 25 mg/dL 17 19 16   Creatinine 0.70 - 1.11 mg/dL 5.64) 3.32(R  Sodium 135 - 146 mmol/L 134(L) 133(L) 129(L)  Potassium 3.5 - 5.3 mmol/L 4.8 4.8 5.0  Chloride 98 - 110 mmol/L 99 98 93(L)  CO2 20 - 32 mmol/L 29 28 30   Calcium 8.6 - 10.3 mg/dL 9.9 5.18 9.8  Total Protein 6.1 - 8.1 g/dL - - -  Total Bilirubin 0.2 - 1.2 mg/dL - - -  Alkaline Phos 38 - 126 U/L - - -  AST 10 - 35 U/L - - -  ALT 9 - 46 U/L - - -    Pertinent Microbiology Results for orders placed or performed in visit on 11/06/19  AFB Culture & Smear     Status: Abnormal   Collection Time: 11/06/19  3:14 PM   Specimen: Sputum   Sputum  Result Value Ref Range Status   AFB Specimen Processing Concentration  Final   Acid Fast Smear Negative  Final   Acid Fast Culture Positive (A)  Final    Comment: Possible aerobic actinomycete detected in culture at 3 weeks; see Organism ID by Sequencing   Organism ID by Sequencing      Status: Abnormal   Collection Time: 11/06/19  3:14 PM   Sputum  Result Value Ref Range Status   Organism ID by Sequencing Comment (A)  Final    Comment: Nocardia cyriacigeorgica If clinical considerations warrant susceptibility testing, contact the Microbiology Department.     Pertinent Imaging Chest Xray 11/22/2019   FINDINGS: The heart size and mediastinal contours are within normal limits. Normal pulmonary vascularity. The lungs are hyperinflated. Patchy reticulonodular opacities throughout both lungs, greater on the right. More focal opacity at the right lung base and in the subpleural left upper lobe. No pleural effusion or pneumothorax. No acute osseous abnormality.  IMPRESSION: 1. Patchy reticulonodular opacities throughout both lungs, greater on the right, suspicious for chronic atypical infection.   CTAPE 09/17/19 FINDINGS:  # Pulmonary arteries:No evidence of pulmonary embolism.   # Aorta:No evidence of dissection or other acute pathology.   # Mediastinum: Heart and thoracic lymph nodes are normal in size.   # Abdomen:No acute findings in the visible abdominal structures.   #  Lungs:There are multiple small foci of patchy opacities bilaterally are without significant change since prior study. Bronchiectasis and mucous plugging are also present bilaterally greatest in the lower lobes. Micronodular infiltrate in the posterior right lower lobe redemonstrated on image 108 series 5, lung windows. Small portion of the right lower lobe in the costophrenic gutter is also partially consolidated as it was previously. No pleural effusions or pneumothoraces. No concerning nodules.   # MSK:No acute or aggressive bony abnormalities.    IMPRESSION:  1. No evidence of pulmonary embolism.  2. Multiple bilateral patchy opacities, bronchiectasis, wall thickening and micronodular infiltrate in the right lower lobe consistent with chronic infection such as MAI   MRI Brain WWO  contrast 03/06/20  FINDINGS: Brain: No acute infarction, hemorrhage, hydrocephalus, extra-axial collection or mass lesion. No evidence of meningitis. Age normal brain volume. Remote hemorrhage or calcification in the paramediastinal left parietal lobe. Mild for age chronic small vessel ischemia the cerebral white matter. Small remote inferior right cerebellar infarction. Enhancement at the superficial right internal auditory canal is a vascular loop (see T2 weighted imaging).  Vascular: Normal flow voids.  Skull and upper cervical spine: Normal marrow signal.  Sinuses/Orbits: Negative.  IMPRESSION: No evidence of CNS infection.  All pertinent labs/Imagings/notes reviewed. All pertinent plain films and CT images have been personally visualized and interpreted; radiology reports have been reviewed. Decision making incorporated into the Impression / Recommendations.

## 2020-04-14 NOTE — Assessment & Plan Note (Signed)
CBC, BMP today BMP in 2 weeks

## 2020-04-14 NOTE — Assessment & Plan Note (Signed)
Continue Bactrim 1 tab po daily Will check with Pulm if sputum induction can be done for repeat AFB sputum cx and sensitivities

## 2020-04-15 LAB — CBC
HCT: 39.5 % (ref 38.5–50.0)
Hemoglobin: 14 g/dL (ref 13.2–17.1)
MCH: 32.7 pg (ref 27.0–33.0)
MCHC: 35.4 g/dL (ref 32.0–36.0)
MCV: 92.3 fL (ref 80.0–100.0)
MPV: 9.6 fL (ref 7.5–12.5)
Platelets: 426 10*3/uL — ABNORMAL HIGH (ref 140–400)
RBC: 4.28 10*6/uL (ref 4.20–5.80)
RDW: 12.2 % (ref 11.0–15.0)
WBC: 6.6 10*3/uL (ref 3.8–10.8)

## 2020-04-15 LAB — BASIC METABOLIC PANEL
BUN: 20 mg/dL (ref 7–25)
CO2: 31 mmol/L (ref 20–32)
Calcium: 9.5 mg/dL (ref 8.6–10.3)
Chloride: 99 mmol/L (ref 98–110)
Creat: 1.06 mg/dL (ref 0.70–1.11)
Glucose, Bld: 83 mg/dL (ref 65–99)
Potassium: 4.8 mmol/L (ref 3.5–5.3)
Sodium: 134 mmol/L — ABNORMAL LOW (ref 135–146)

## 2020-04-28 ENCOUNTER — Other Ambulatory Visit: Payer: Medicare HMO

## 2020-04-28 ENCOUNTER — Other Ambulatory Visit: Payer: Self-pay

## 2020-04-28 DIAGNOSIS — Z5181 Encounter for therapeutic drug level monitoring: Secondary | ICD-10-CM

## 2020-04-29 ENCOUNTER — Telehealth: Payer: Self-pay

## 2020-04-29 ENCOUNTER — Other Ambulatory Visit: Payer: Self-pay

## 2020-04-29 DIAGNOSIS — A439 Nocardiosis, unspecified: Secondary | ICD-10-CM

## 2020-04-29 LAB — BASIC METABOLIC PANEL
BUN/Creatinine Ratio: 15 (calc) (ref 6–22)
BUN: 21 mg/dL (ref 7–25)
CO2: 30 mmol/L (ref 20–32)
Calcium: 9.9 mg/dL (ref 8.6–10.3)
Chloride: 97 mmol/L — ABNORMAL LOW (ref 98–110)
Creat: 1.36 mg/dL — ABNORMAL HIGH (ref 0.70–1.11)
Glucose, Bld: 95 mg/dL (ref 65–99)
Potassium: 4.9 mmol/L (ref 3.5–5.3)
Sodium: 133 mmol/L — ABNORMAL LOW (ref 135–146)

## 2020-04-29 NOTE — Telephone Encounter (Signed)
-----   Message from Odette Fraction, MD sent at 04/29/2020  8:22 AM EST ----- Please let the patient know that his Cr went up from 1.06 to 1.36? Is he drinking enough fluids? Any new nedications Any other causes of dehydration?  If not, I am thinking to decrease his dose further from  Bactrim 1 ds tab po daily to half tablet daily.

## 2020-04-29 NOTE — Telephone Encounter (Signed)
Ok, In that case I would ask him to drink plenty of fluids. Please repeat his BMP in one week

## 2020-04-29 NOTE — Telephone Encounter (Signed)
Noted. Patient made aware and accepts lab appointment next week.

## 2020-04-29 NOTE — Telephone Encounter (Signed)
Reach out to patient regarding lab results. Patient states he tries to remind himself to increase his fluid intake, but he is probably not getting enough.   Patient advised to increase fluids daily. And made aware that we will continue to follow up with him if MD decides to make any medication changes.  Patient very appreciative of phone call. Valarie Cones

## 2020-04-29 NOTE — Telephone Encounter (Signed)
Thanks again!

## 2020-05-07 ENCOUNTER — Other Ambulatory Visit: Payer: Self-pay

## 2020-05-07 ENCOUNTER — Other Ambulatory Visit: Payer: Medicare HMO

## 2020-05-07 DIAGNOSIS — A439 Nocardiosis, unspecified: Secondary | ICD-10-CM

## 2020-05-08 LAB — BASIC METABOLIC PANEL
BUN: 19 mg/dL (ref 7–25)
CO2: 30 mmol/L (ref 20–32)
Calcium: 9.7 mg/dL (ref 8.6–10.3)
Chloride: 96 mmol/L — ABNORMAL LOW (ref 98–110)
Creat: 0.99 mg/dL (ref 0.70–1.11)
Glucose, Bld: 74 mg/dL (ref 65–99)
Potassium: 5.2 mmol/L (ref 3.5–5.3)
Sodium: 131 mmol/L — ABNORMAL LOW (ref 135–146)

## 2020-05-17 DIAGNOSIS — I639 Cerebral infarction, unspecified: Secondary | ICD-10-CM

## 2020-05-17 HISTORY — DX: Cerebral infarction, unspecified: I63.9

## 2020-05-20 ENCOUNTER — Encounter: Payer: Self-pay | Admitting: Infectious Diseases

## 2020-05-20 ENCOUNTER — Other Ambulatory Visit: Payer: Self-pay

## 2020-05-20 ENCOUNTER — Ambulatory Visit: Payer: Medicare HMO | Admitting: Infectious Diseases

## 2020-05-20 VITALS — BP 150/86 | HR 72 | Temp 98.0°F | Ht 68.0 in | Wt 138.0 lb

## 2020-05-20 DIAGNOSIS — Z5181 Encounter for therapeutic drug level monitoring: Secondary | ICD-10-CM

## 2020-05-20 DIAGNOSIS — A439 Nocardiosis, unspecified: Secondary | ICD-10-CM

## 2020-05-20 DIAGNOSIS — A430 Pulmonary nocardiosis: Secondary | ICD-10-CM

## 2020-05-20 DIAGNOSIS — Z23 Encounter for immunization: Secondary | ICD-10-CM | POA: Diagnosis not present

## 2020-05-20 LAB — CBC
HCT: 42 % (ref 38.5–50.0)
Hemoglobin: 14.6 g/dL (ref 13.2–17.1)
MCH: 32.7 pg (ref 27.0–33.0)
MCHC: 34.8 g/dL (ref 32.0–36.0)
MCV: 94 fL (ref 80.0–100.0)
MPV: 9.6 fL (ref 7.5–12.5)
Platelets: 409 10*3/uL — ABNORMAL HIGH (ref 140–400)
RBC: 4.47 10*6/uL (ref 4.20–5.80)
RDW: 11.9 % (ref 11.0–15.0)
WBC: 5.5 10*3/uL (ref 3.8–10.8)

## 2020-05-20 LAB — COMPREHENSIVE METABOLIC PANEL
AG Ratio: 1.3 (calc) (ref 1.0–2.5)
ALT: 11 U/L (ref 9–46)
AST: 22 U/L (ref 10–35)
Albumin: 4 g/dL (ref 3.6–5.1)
Alkaline phosphatase (APISO): 59 U/L (ref 35–144)
BUN: 16 mg/dL (ref 7–25)
CO2: 31 mmol/L (ref 20–32)
Calcium: 9.7 mg/dL (ref 8.6–10.3)
Chloride: 100 mmol/L (ref 98–110)
Creat: 1.09 mg/dL (ref 0.70–1.11)
Globulin: 3.2 g/dL (calc) (ref 1.9–3.7)
Glucose, Bld: 81 mg/dL (ref 65–99)
Potassium: 5 mmol/L (ref 3.5–5.3)
Sodium: 135 mmol/L (ref 135–146)
Total Bilirubin: 0.5 mg/dL (ref 0.2–1.2)
Total Protein: 7.2 g/dL (ref 6.1–8.1)

## 2020-05-20 MED ORDER — SULFAMETHOXAZOLE-TRIMETHOPRIM 800-160 MG PO TABS
1.0000 | ORAL_TABLET | Freq: Every day | ORAL | 2 refills | Status: DC
Start: 2020-05-20 — End: 2020-06-17

## 2020-05-20 NOTE — Assessment & Plan Note (Signed)
Continue Bactrim 1 tab PO daily  Fu in 1 month Fu with Pulmonology

## 2020-05-20 NOTE — Progress Notes (Signed)
[facility]                                                             [address], [place], [place], [address]                                                                  Phn. 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 SOB stable/mildly worse  Bronchiectasis Ex Smoker  Splenectomy - patient confirms he has received all recommended vaccine for splenectomy ( H influenzae, Pneumococcal and Meningococcus vaccines)  Plan Continue Bactrim 1DS tab po daily. Tentative end of therapy around end of March. Will get a repeat imaging at the end of treatment  CBC and CMP today  Follow up with Pulmonology ? Induced sputum for repeat AFB sputum cx and sensi ( no sensi. available from prior sputum cx) Follow up with me in 4 -6 weeks   All questions and concerns were discussed and addressed. Patient and wife verbalised understanding of the plan  Odette Fraction, MD [facility]  Office phone [phone] Fax no. [phone]_ __________________________________________________________________________________________________________ Subjective/Interval events 05/20/20 Patient by himself today as wife is having cataract sx tomorrow. He is taking bactrim 1 tab daily and trying to drink fluid as much as possible. He has missed only one dose of bactrim since last clinic visit. No issues with the bactrim like N/V/D/rashes and itching.  He feels better every day. Cough is better. he has mild cough ,very little in the morning with minimal phlegm. SOB is present when walking  uphills and probably thinks it has gotten worse. He has a follow up with Dr Celine Mans in spring 2022.  Willing to get PCV 23 shot today.  Denies fever/chills/sweats. Denies nausea/vomiting/diarrhea. Denies urinary symptoms. Appetite is good.    ROS: 11 point ROS with all pertinent positives and negatives listed above. Otherwise, ROS is negative  Past Medical History:  Diagnosis Date  . Enlarged prostate   . Hypertension   . Renal stones    Past Surgical History:  Procedure Laterality Date  . APPENDECTOMY    . CHOLECYSTECTOMY    . NOSE SURGERY    . SPLENECTOMY, TOTAL     Current Outpatient Medications on File Prior to Visit  Medication Sig Dispense Refill  . acetaminophen (TYLENOL) 325 MG tablet Take 325-650 mg by mouth every 6 (six)  hours as needed for moderate pain.     Marland Kitchen Alum Hydroxide-Mag Carbonate (ACID GONE ANTACID PO) Take 1-2 tablets by mouth daily as needed (indigestion). (Patient not taking: Reported on 04/14/2020)    . aspirin 81 MG EC tablet Take by mouth.    . calcium carbonate (OS-CAL) 1250 (500 Ca) MG chewable tablet Chew by mouth. (Patient not taking: Reported on 04/14/2020)    . Cholecalciferol 10 MCG (400 UNIT) CAPS Take 400 Units by mouth daily.  (Patient not taking: Reported on 03/07/2020)    . losartan (COZAAR) 50 MG tablet Take 1 tablet by mouth daily. (Patient not taking: Reported on 04/14/2020)    . polyethylene glycol powder (GLYCOLAX/MIRALAX) 17 GM/SCOOP powder Take 0.5 Containers by mouth daily as needed for moderate constipation.     Marland Kitchen Respiratory Therapy Supplies (FLUTTER) DEVI 1 puff by Does not apply route in the morning and at bedtime. (Patient not taking: Reported on 03/07/2020) 1 each 0  . sodium chloride (OCEAN) 0.65 % nasal spray Place 1 spray into the nose daily as needed for congestion.     . sodium chloride HYPERTONIC 3 % nebulizer solution Take by nebulization as needed for other. (Patient not taking: Reported on 04/14/2020) 750 mL 12  .  sulfamethoxazole-trimethoprim (BACTRIM DS) 800-160 MG tablet Take 2 tablets by mouth 2 (two) times daily. 120 tablet 2   No current facility-administered medications on file prior to visit.   Allergies  Allergen Reactions  . Codeine    Social History   Socioeconomic History  . Marital status: Married    Spouse name: Not on file  . Number of children: Not on file  . Years of education: Not on file  . Highest education level: Not on file  Occupational History  . Not on file  Tobacco Use  . Smoking status: Former Smoker    Packs/day: 1.00    Years: 30.00    Pack years: 30.00    Types: Cigarettes    Quit date: 10/07/1984    Years since quitting: 35.6  . Smokeless tobacco: Never Used  Substance and Sexual Activity  . Alcohol use: Never  . Drug use: Never  . Sexual activity: Not on file  Other Topics Concern  . Not on file  Social History Narrative  . Not on file   Social Determinants of Health   Financial Resource Strain: Not on file  Food Insecurity: Not on file  Transportation Needs: Not on file  Physical Activity: Not on file  Stress: Not on file  Social Connections: Not on file  Intimate Partner Violence: Not on file    Vitals BP (!) 150/86   Pulse 72   Temp 98 F (36.7 C)   Ht 5\' 8"  (1.727 m)   Wt 138 lb (62.6 kg)   BMI 20.98 kg/m    Examination  General - not in acute distress, comfortably sitting in chair HEENT - PEERLA, no pallor and no icterus Chest - b/l clear air entry, no additional sounds CVS- Normal s1s2, RRR Abdomen - Soft, Non tender , non distended Ext- no pedal edema Neuro: grossly normal Back - WNL Psych : calm and cooperative   Recent labs CBC Latest Ref Rng & Units 04/14/2020 02/20/2020 02/19/2020  WBC 3.8 - 10.8 Thousand/uL 6.6 5.0 5.9  Hemoglobin 13.2 - 17.1 g/dL 04/20/2020 53.6 14.4  Hematocrit 38.5 - 50.0 % 39.5 41.0 41.5  Platelets 140 - 400 Thousand/uL 426(H) 342 336   CMP Latest Ref Rng & Units  05/07/2020 04/28/2020 04/14/2020   Glucose 65 - 99 mg/dL 74 95 83  BUN 7 - 25 mg/dL 19 21 20   Creatinine 0.70 - 1.11 mg/dL 0.99 1.36(H) 1.06  Sodium 135 - 146 mmol/L 131(L) 133(L) 134(L)  Potassium 3.5 - 5.3 mmol/L 5.2 4.9 4.8  Chloride 98 - 110 mmol/L 96(L) 97(L) 99  CO2 20 - 32 mmol/L 30 30 31   Calcium 8.6 - 10.3 mg/dL 9.7 9.9 9.5  Total Protein 6.1 - 8.1 g/dL - - -  Total Bilirubin 0.2 - 1.2 mg/dL - - -  Alkaline Phos 38 - 126 U/L - - -  AST 10 - 35 U/L - - -  ALT 9 - 46 U/L - - -    Pertinent Microbiology Results for orders placed or performed in visit on 11/06/19  AFB Culture & Smear     Status: Abnormal   Collection Time: 11/06/19  3:14 PM   Specimen: Sputum   Sputum  Result Value Ref Range Status   AFB Specimen Processing Concentration  Final   Acid Fast Smear Negative  Final   Acid Fast Culture Positive (A)  Final    Comment: 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      All pertinent labs/Imagings/notes reviewed. All pertinent plain films and CT images have been personally visualized and interpreted; radiology reports have been reviewed. Decision making incorporated into the Impression / Recommendations.  I spent greater than 25 minutes with the patient including  review of prior medical records with greater than 50% of time in face to face counsel of the patient.    Electronically signed by:  Rosiland Oz, MD Infectious Disease Physician [facility] [address] [place], [place] [address] Phone: [phone]  Fax: [phone]

## 2020-05-20 NOTE — Assessment & Plan Note (Signed)
PCV 23 today ( after age 87)

## 2020-05-20 NOTE — Assessment & Plan Note (Signed)
CBC and BMP Today

## 2020-05-21 ENCOUNTER — Telehealth: Payer: Self-pay

## 2020-05-21 ENCOUNTER — Other Ambulatory Visit: Payer: Self-pay

## 2020-05-21 DIAGNOSIS — J479 Bronchiectasis, uncomplicated: Secondary | ICD-10-CM

## 2020-05-21 NOTE — Telephone Encounter (Signed)
Patient accepts lab appointment in 2 weeks.  Samuel Mata

## 2020-05-21 NOTE — Telephone Encounter (Signed)
Thank you :)

## 2020-05-21 NOTE — Telephone Encounter (Signed)
-----   Message from Odette Fraction, MD sent at 05/21/2020  7:40 AM EST ----- Please make appt for him in 2 weeks for lab visit ( BMP) ?

## 2020-05-30 NOTE — Addendum Note (Signed)
Addended by: Tressa Busman T on: 05/30/2020 01:52 PM   Modules accepted: Orders

## 2020-06-04 ENCOUNTER — Other Ambulatory Visit: Payer: Medicare HMO

## 2020-06-04 ENCOUNTER — Other Ambulatory Visit: Payer: Self-pay

## 2020-06-04 DIAGNOSIS — J479 Bronchiectasis, uncomplicated: Secondary | ICD-10-CM

## 2020-06-05 LAB — BASIC METABOLIC PANEL
BUN: 15 mg/dL (ref 7–25)
CO2: 29 mmol/L (ref 20–32)
Calcium: 9.5 mg/dL (ref 8.6–10.3)
Chloride: 98 mmol/L (ref 98–110)
Creat: 1 mg/dL (ref 0.70–1.11)
Glucose, Bld: 90 mg/dL (ref 65–99)
Potassium: 4.5 mmol/L (ref 3.5–5.3)
Sodium: 132 mmol/L — ABNORMAL LOW (ref 135–146)

## 2020-06-09 ENCOUNTER — Encounter: Payer: Self-pay | Admitting: Internal Medicine

## 2020-06-09 ENCOUNTER — Other Ambulatory Visit: Payer: Self-pay

## 2020-06-09 ENCOUNTER — Ambulatory Visit (INDEPENDENT_AMBULATORY_CARE_PROVIDER_SITE_OTHER): Payer: Medicare HMO | Admitting: Internal Medicine

## 2020-06-09 VITALS — BP 130/78 | HR 78 | Ht 67.0 in | Wt 138.0 lb

## 2020-06-09 DIAGNOSIS — A430 Pulmonary nocardiosis: Secondary | ICD-10-CM | POA: Diagnosis not present

## 2020-06-09 DIAGNOSIS — J479 Bronchiectasis, uncomplicated: Secondary | ICD-10-CM | POA: Diagnosis not present

## 2020-06-09 DIAGNOSIS — J432 Centrilobular emphysema: Secondary | ICD-10-CM

## 2020-06-09 NOTE — Progress Notes (Signed)
Samuel Mata    [id number]    Nov 28, 1930  Primary Care Physician:[facility] Date of Appointment: 06/09/2020 Established Patient Visit  Chief complaint:   Chief Complaint  Patient presents with  . Follow-up    Pt states he has been doing okay since last visit and denies any real complaints.     HPI: Samuel Mata is a 87 y.o. gentleman with bronchiectasis and pulmonary nocardiosis  Interval Updates: Since last visit has seen ID and has been started on bactrim. He is not doing airway clearance with nebulizer and flutter valve. He did it for a couple of months but stopped doing it because it wasn't making him cough anything up. He has been feeling better since bactrim and no cough, fevers, chills, night sweats or weight loss.    I have reviewed the patient's family social and past medical history and updated as appropriate.   Past Medical History:  Diagnosis Date  . Enlarged prostate   . Hypertension   . Renal stones     Past Surgical History:  Procedure Laterality Date  . APPENDECTOMY    . CHOLECYSTECTOMY    . NOSE SURGERY    . SPLENECTOMY, TOTAL      Family History  Problem Relation Age of Onset  . Lung disease Neg Hx     Social History   Occupational History  . Not on file  Tobacco Use  . Smoking status: Former Smoker    Packs/day: 1.00    Years: 30.00    Pack years: 30.00    Types: Cigarettes    Quit date: 10/07/1984    Years since quitting: 35.6  . Smokeless tobacco: Never Used  Substance and Sexual Activity  . Alcohol use: Never  . Drug use: Never  . Sexual activity: Not on file     Physical Exam: Blood pressure 130/78, pulse 78, height 5\' 7"  (1.702 m), weight 138 lb (62.6 kg), SpO2 97 %.  Gen:      No acute distress, ambulating with cane Lungs:   Diminished, no wheezes or crackles CV:         Regular rate and rhythm; no murmurs, rubs, or gallops.  No pedal edema   Data Reviewed: Imaging: I  have personally reviewed the CT Chest done at [facility] reviewed personally - consistent with nodular and tree in bud opacities concerning for MAI. Also hyperinflation and some centrilobular emphysema. Given to upload to PACS on 5/27.   PFTs: Mild airflow limitation without a BD response on today's PFTs.  Labs:  Immunization status: Immunization History  Administered Date(s) Administered  . Fluad Quad(high Dose 65+) 02/07/2020  . Influenza Split 03/17/2004  . Influenza, High Dose Seasonal PF 02/15/2019  . Influenza, Seasonal, Injecte, Preservative Fre 02/28/2013, 03/07/2014  . PFIZER(Purple Top)SARS-COV-2 Vaccination 06/13/2019, 06/26/2019, 02/15/2020  . Pneumococcal Conjugate-13 12/21/2013  . Pneumococcal Polysaccharide-23 05/17/2002, 09/15/2002, 05/20/2020  . Td 05/17/2002  . Tdap 06/10/2017  . Zoster Recombinat (Shingrix) 01/26/2014    Assessment:  Chronic bronchitis with Bronchiectasis - stable History of pseudomonas infection Pulmonary Nocardiosis - mild infection improved Emphysema - stable  Plan/Recommendations: Continue bactrim per ID for nocardiosis He is doing well off inhaler therapies for his copd with emphysema.  I will reach out to ID to see if they need a sputum culture to help determine duration of therapy. I think he will need bronchoscopy for this.   I spent 31 minutes in the care of  this patient today including pre-charting, chart review, review of results, face-to-face care, coordination of care and communication with consultants etc.).   Return to Care: Return in about 6 months (around 12/07/2020).   Durel Salts, MD Pulmonary and Critical Care Medicine [facility] [phone]

## 2020-06-09 NOTE — Patient Instructions (Addendum)
The patient should have follow up scheduled with myself in 6 months. Please contact me sooner if symptoms get worse.   I will reach out to your infectious disease doctor about bronchoscopy. I will be in touch with you when I hear back. Follow up with your family doctor about TIA symptoms. You might need to see a neurologist.

## 2020-06-17 ENCOUNTER — Other Ambulatory Visit: Payer: Self-pay

## 2020-06-17 ENCOUNTER — Ambulatory Visit (INDEPENDENT_AMBULATORY_CARE_PROVIDER_SITE_OTHER): Payer: Medicare HMO | Admitting: Infectious Diseases

## 2020-06-17 ENCOUNTER — Encounter: Payer: Self-pay | Admitting: Infectious Diseases

## 2020-06-17 VITALS — BP 121/73 | HR 77 | Temp 98.0°F | Ht 67.0 in | Wt 139.0 lb

## 2020-06-17 DIAGNOSIS — Z5181 Encounter for therapeutic drug level monitoring: Secondary | ICD-10-CM | POA: Diagnosis not present

## 2020-06-17 DIAGNOSIS — A430 Pulmonary nocardiosis: Secondary | ICD-10-CM | POA: Diagnosis not present

## 2020-06-17 LAB — COMPREHENSIVE METABOLIC PANEL
AG Ratio: 1.1 (calc) (ref 1.0–2.5)
ALT: 9 U/L (ref 9–46)
AST: 20 U/L (ref 10–35)
Albumin: 3.6 g/dL (ref 3.6–5.1)
Alkaline phosphatase (APISO): 56 U/L (ref 35–144)
BUN: 19 mg/dL (ref 7–25)
CO2: 30 mmol/L (ref 20–32)
Calcium: 9.6 mg/dL (ref 8.6–10.3)
Chloride: 100 mmol/L (ref 98–110)
Creat: 1.1 mg/dL (ref 0.70–1.11)
Globulin: 3.3 g/dL (calc) (ref 1.9–3.7)
Glucose, Bld: 87 mg/dL (ref 65–99)
Potassium: 4.6 mmol/L (ref 3.5–5.3)
Sodium: 135 mmol/L (ref 135–146)
Total Bilirubin: 0.4 mg/dL (ref 0.2–1.2)
Total Protein: 6.9 g/dL (ref 6.1–8.1)

## 2020-06-17 LAB — CBC
HCT: 39.5 % (ref 38.5–50.0)
Hemoglobin: 13.8 g/dL (ref 13.2–17.1)
MCH: 32.3 pg (ref 27.0–33.0)
MCHC: 34.9 g/dL (ref 32.0–36.0)
MCV: 92.5 fL (ref 80.0–100.0)
MPV: 9.8 fL (ref 7.5–12.5)
Platelets: 403 10*3/uL — ABNORMAL HIGH (ref 140–400)
RBC: 4.27 10*6/uL (ref 4.20–5.80)
RDW: 11.5 % (ref 11.0–15.0)
WBC: 7.1 10*3/uL (ref 3.8–10.8)

## 2020-06-17 MED ORDER — SULFAMETHOXAZOLE-TRIMETHOPRIM 800-160 MG PO TABS: 1.0000 | ORAL_TABLET | Freq: Every day | ORAL | 2 refills | Status: DC

## 2020-06-17 NOTE — Progress Notes (Signed)
[facility]                                                             [address], [place], [place], [address]                                                                  Phn. 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 SOB stable Bronchiectasis Ex Smoker  Splenectomy- patient confirms he has received all recommended vaccine for splenectomy ( H influenzae, Pneumococcal and Meningococcus vaccines)  Plan Continue Bactrim 1DS tab podaily. Tentative end of therapy around end of March.  CBC and CMP today for medication monitoring  Follow up with me in 1 month   All questions and concerns were discussed and addressed. Patient and wife verbalised understanding of the plan _________________________________________________________________________________________________________________ Subjective/Interval events  03/07/20 Patient is here for follow up for Pulmonary Nocardia. Bactrim was stopped on 10/6 given K of 6/Na 129. It was resumed on 10/18 after repeat  NA and K were WNL. Losartan has been stopped as well as his BP is in the lower side. He has started taking bactrim since 10/18 and denies any side effects like nausea, vomiting, abdominal pain, diarrhea and rashes/itching. Discussed about MRI brain results being unremarkable. Wife wants to do BMP more often so that she does not have to end up going to the ED.   Patient says cough is not much. Few bouts in the day. It does not bother him much. It is non productive and has not been able to being a sputum sample I requested last visit. He  feels SOB when he walks in a hill nearby their house and thinks it is age related. He overall feels the same.   04/14/20 Patient is here for follow up for Pulmonary Nocardia. Bactrim was initially stopped on 10/6 given K of 6/Na 129. It was resumed on 10/18 after repeat  NA and K were WNL. He has started taking bactrim since 10/18. Dosing of batrim was reduced from BID to once daily dosing given AKI of 1.29. he has not missed any doses. He says overall he feels so better after he has started taking the bactrim and wants to continue taking it. Denies any phlegm prodcution, He really does not have a lot of SOB to being with. He feels SOB after walking uphills to which he has not  noted any significant difference.   He has fallen a couple of times in the last month after feeling dizzy. He says he feels dizzy after standing from a sitting position ?Orthostatic hypotension. Discussed about preventive measures and adequate hydration. Overall feels stable.   05/20/20 Patient by himself today as wife is having cataract sx tomorrow. He is taking bactrim 1 tab daily and trying to drink fluid as much as possible. He has missed only one dose of bactrim since last clinic visit. No issues with the bactrim like N/V/D/rashes and itching.  He feels better every day. Cough is better. he has mild cough ,very little in the morning with minimal phlegm. SOB is present when walking uphills and probably thinks it has gotten worse. He has a follow up with Dr Celine Mans in spring 2022.  Willing to get PCV 23 shot today.  Denies fever/chills/sweats. Denies nausea/vomiting/diarrhea. Denies urinary symptoms. Appetite is good.   06/17/20 Gilmer Mor is here with his wife for follow up of Nocardia pneumonia. He says he feels a lot better since he has been started on Bactrim and would rate it as 3/10 before starting bactrim and now its around 7/10  in terms of his feeling good and severity of cough. He says SOB has also been better except  he feels SOB when wearing mask. He also feels winded sometimes while walking uphills and vaccuming at home. Cough is very minimal if any and is mostly in the morning. Sneezes more, denies any fevers, chills and sweats.   Denies any nausea/vomiting/diarrhea/rashes and allergy.  Follows up with Pulomonology regularly for Bronchiectasis  Overall feels well and no complaints today.  ROS: 12 point ROS done with pertinent positives and negatives listed above.  Past Medical History:  Diagnosis Date  . Enlarged prostate   . Hypertension   . Renal stones    Past Surgical History:  Procedure Laterality Date  . APPENDECTOMY    . CHOLECYSTECTOMY    . NOSE SURGERY    . SPLENECTOMY, TOTAL     Current Outpatient Medications on File Prior to Visit  Medication Sig Dispense Refill  . acetaminophen (TYLENOL) 325 MG tablet Take 325-650 mg by mouth every 6 (six) hours as needed for moderate pain.     Marland Kitchen Alum Hydroxide-Mag Carbonate (ACID GONE ANTACID PO) Take 1-2 tablets by mouth daily as needed (indigestion).    Marland Kitchen aspirin 81 MG EC tablet Take by mouth. (Patient not taking: No sig reported)    . calcium carbonate (OS-CAL) 1250 (500 Ca) MG chewable tablet Chew by mouth.    . Cholecalciferol 10 MCG (400 UNIT) CAPS Take 400 Units by mouth daily.    Marland Kitchen Respiratory Therapy Supplies (FLUTTER) DEVI 1 puff by Does not apply route in the morning and at bedtime. (Patient not taking: Reported on 06/09/2020) 1 each 0  . sodium chloride (OCEAN) 0.65 % nasal spray Place 1 spray into the nose daily as needed for congestion.     . sodium chloride HYPERTONIC 3 % nebulizer solution Take by nebulization as needed for other. (Patient not taking: Reported on 06/09/2020) 750 mL 12  . sulfamethoxazole-trimethoprim (BACTRIM DS) 800-160 MG tablet Take 1 tablet by mouth daily. 30 tablet 2   No current facility-administered medications on file prior to visit.   Allergies  Allergen Reactions  . Codeine    Social History    Socioeconomic History  . Marital status: Married    Spouse name: Not on file  . Number of children: Not  on file  . Years of education: Not on file  . Highest education level: Not on file  Occupational History  . Not on file  Tobacco Use  . Smoking status: Former Smoker    Packs/day: 1.00    Years: 30.00    Pack years: 30.00    Types: Cigarettes    Quit date: 10/07/1984    Years since quitting: 35.7  . Smokeless tobacco: Never Used  Substance and Sexual Activity  . Alcohol use: Never  . Drug use: Never  . Sexual activity: Not on file  Other Topics Concern  . Not on file  Social History Narrative  . Not on file   Social Determinants of Health   Financial Resource Strain: Not on file  Food Insecurity: Not on file  Transportation Needs: Not on file  Physical Activity: Not on file  Stress: Not on file  Social Connections: Not on file  Intimate Partner Violence: Not on file     Vitals  Examination  General - not in acute distress, comfortably sitting in chair HEENT - PEERLA, no pallor and no icterus Chest - b/l clear air entry, no additional sounds CVS- Normal s1s2, RRR Abdomen - Soft, Non tender , non distended Ext- no pedal edema Neuro: grossly normal Back - WNL Psych : calm and cooperative   Recent labs CBC Latest Ref Rng & Units 05/20/2020 04/14/2020 02/20/2020  WBC 3.8 - 10.8 Thousand/uL 5.5 6.6 5.0  Hemoglobin 13.2 - 17.1 g/dL 01.7 79.3 90.3  Hematocrit 38.5 - 50.0 % 42.0 39.5 41.0  Platelets 140 - 400 Thousand/uL 409(H) 426(H) 342   CBC Latest Ref Rng & Units 05/20/2020 04/14/2020 02/20/2020  WBC 3.8 - 10.8 Thousand/uL 5.5 6.6 5.0  Hemoglobin 13.2 - 17.1 g/dL 00.9 23.3 00.7  Hematocrit 38.5 - 50.0 % 42.0 39.5 41.0  Platelets 140 - 400 Thousand/uL 409(H) 426(H) 342    Pertinent Microbiology Results for orders placed or performed in visit on 11/06/19  AFB Culture & Smear     Status: Abnormal   Collection Time: 11/06/19  3:14 PM   Specimen: Sputum    Sputum  Result Value Ref Range Status   AFB Specimen Processing Concentration  Final   Acid Fast Smear Negative  Final   Acid Fast Culture Positive (A)  Final    Comment: 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     All pertinent labs/Imagings/notes reviewed. All pertinent plain films and CT images have been personally visualized and interpreted; radiology reports have been reviewed. Decision making incorporated into the Impression / Recommendations.  I spent greater than 25 minutes with the patient including  review of prior medical records with greater than 50% of time in face to face counsel of the patient.    Electronically signed by:  Odette Fraction, MD Infectious Disease Physician [facility] [address] [place], [place] [address] Phone: [phone]  Fax: [phone]  [facility]                                                             [address], [place], [place], [address]                                                                  Phn. 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 SOB stable/mildly worse  Bronchiectasis Ex Smoker  Splenectomy -  patient confirms he has received all recommended vaccine for splenectomy ( H influenzae, Pneumococcal and Meningococcus vaccines)  Plan Continue Bactrim 1DS tab po daily. Tentative end of therapy around end of March. Will get a repeat imaging at the end of treatment  CBC and CMP today  Follow up with Pulmonology ? Induced sputum for repeat AFB sputum cx and sensi ( no sensi. available from prior sputum cx) Follow up with me in 4 -6 weeks   All questions and concerns were discussed and addressed. Patient and wife verbalised understanding of the plan  Odette Fraction, MD [facility]  Office phone [phone] Fax no. [phone]_ __________________________________________________________________________________________________________ Subjective/Interval events 05/20/20 Patient by himself today as wife is having cataract sx tomorrow. He is taking bactrim 1 tab daily and trying to drink fluid as much as possible. He has missed only one dose of bactrim since last clinic visit. No issues with the bactrim like N/V/D/rashes and itching.  He feels better every day. Cough is better. he has mild cough ,very little in the morning with minimal phlegm. SOB is present when walking uphills and probably thinks it has gotten worse. He has a follow up with Dr Celine Mans in spring 2022.  Willing to get PCV 23 shot today.  Denies fever/chills/sweats. Denies nausea/vomiting/diarrhea. Denies urinary symptoms. Appetite is good.    ROS: 11 point ROS with all pertinent positives and negatives listed above. Otherwise, ROS is negative  Past Medical History:  Diagnosis Date  . Enlarged prostate   . Hypertension   . Renal stones    Past Surgical History:  Procedure Laterality Date  . APPENDECTOMY    . CHOLECYSTECTOMY    . NOSE SURGERY    . SPLENECTOMY, TOTAL     Current Outpatient Medications on File Prior to Visit  Medication Sig Dispense Refill  . acetaminophen (TYLENOL) 325 MG  tablet Take 325-650 mg by mouth every 6 (  six) hours as needed for moderate pain.     Marland Kitchen. Alum Hydroxide-Mag Carbonate (ACID GONE ANTACID PO) Take 1-2 tablets by mouth daily as needed (indigestion).    Marland Kitchen. aspirin 81 MG EC tablet Take by mouth. (Patient not taking: No sig reported)    . calcium carbonate (OS-CAL) 1250 (500 Ca) MG chewable tablet Chew by mouth.    . Cholecalciferol 10 MCG (400 UNIT) CAPS Take 400 Units by mouth daily.    Marland Kitchen. Respiratory Therapy Supplies (FLUTTER) DEVI 1 puff by Does not apply route in the morning and at bedtime. (Patient not taking: Reported on 06/09/2020) 1 each 0  . sodium chloride (OCEAN) 0.65 % nasal spray Place 1 spray into the nose daily as needed for congestion.     . sodium chloride HYPERTONIC 3 % nebulizer solution Take by nebulization as needed for other. (Patient not taking: Reported on 06/09/2020) 750 mL 12  . sulfamethoxazole-trimethoprim (BACTRIM DS) 800-160 MG tablet Take 1 tablet by mouth daily. 30 tablet 2   No current facility-administered medications on file prior to visit.   Allergies  Allergen Reactions  . Codeine    Social History   Socioeconomic History  . Marital status: Married    Spouse name: Not on file  . Number of children: Not on file  . Years of education: Not on file  . Highest education level: Not on file  Occupational History  . Not on file  Tobacco Use  . Smoking status: Former Smoker    Packs/day: 1.00    Years: 30.00    Pack years: 30.00    Types: Cigarettes    Quit date: 10/07/1984    Years since quitting: 35.7  . Smokeless tobacco: Never Used  Substance and Sexual Activity  . Alcohol use: Never  . Drug use: Never  . Sexual activity: Not on file  Other Topics Concern  . Not on file  Social History Narrative  . Not on file   Social Determinants of Health   Financial Resource Strain: Not on file  Food Insecurity: Not on file  Transportation Needs: Not on file  Physical Activity: Not on file  Stress: Not on  file  Social Connections: Not on file  Intimate Partner Violence: Not on file    Vitals BP 121/73   Pulse 77   Temp 98 F (36.7 C) (Oral)   Ht 5\' 7"  (1.702 m)   Wt 139 lb (63 kg)   SpO2 94%   BMI 21.77 kg/m    Examination  General - not in acute distress, comfortably sitting in chair, HARD OF HEARING  HEENT - PEERLA, no pallor and no icterus Chest - b/l clear air entry, no additional sounds CVS- Normal s1s2, RRR Abdomen - Soft, Non tender , non distended Ext- no pedal edema Neuro: grossly normal Back - WNL Psych : calm and cooperative   Recent labs CBC Latest Ref Rng & Units 05/20/2020 04/14/2020 02/20/2020  WBC 3.8 - 10.8 Thousand/uL 5.5 6.6 5.0  Hemoglobin 13.2 - 17.1 g/dL 16.114.6 09.614.0 04.514.1  Hematocrit 38.5 - 50.0 % 42.0 39.5 41.0  Platelets 140 - 400 Thousand/uL 409(H) 426(H) 342   CMP Latest Ref Rng & Units 06/04/2020 05/20/2020 05/07/2020  Glucose 65 - 99 mg/dL 90 81 74  BUN 7 - 25 mg/dL 15 16 19   Creatinine 0.70 - 1.11 mg/dL 4.091.00 8.111.09 9.140.99  Sodium 135 - 146 mmol/L 132(L) 135 131(L)  Potassium 3.5 - 5.3 mmol/L 4.5 5.0 5.2  Chloride 98 -  110 mmol/L 98 100 96(L)  CO2 20 - 32 mmol/L 29 31 30   Calcium 8.6 - 10.3 mg/dL 9.5 9.7 9.7  Total Protein 6.1 - 8.1 g/dL - 7.2 -  Total Bilirubin 0.2 - 1.2 mg/dL - 0.5 -  Alkaline Phos 38 - 126 U/L - - -  AST 10 - 35 U/L - 22 -  ALT 9 - 46 U/L - 11 -    Pertinent Microbiology Results for orders placed or performed in visit on 11/06/19  AFB Culture & Smear     Status: Abnormal   Collection Time: 11/06/19  3:14 PM   Specimen: Sputum   Sputum  Result Value Ref Range Status   AFB Specimen Processing Concentration  Final   Acid Fast Smear Negative  Final   Acid Fast Culture Positive (A)  Final    Comment: Possible aerobic actinomycete detected in culture at 3 weeks; see Organism ID by Sequencing   Organism ID by Sequencing     Status: Abnormal   Collection Time: 11/06/19  3:14 PM   Sputum  Result Value Ref Range Status    Organism ID by Sequencing Comment (A)  Final    Comment: Nocardia cyriacigeorgica If clinical considerations warrant susceptibility testing, contact the Microbiology Department.      All pertinent labs/Imagings/notes reviewed. All pertinent plain films and CT images have been personally visualized and interpreted; radiology reports have been reviewed. Decision making incorporated into the Impression / Recommendations.  I spent greater than 25 minutes with the patient including  review of prior medical records with greater than 50% of time in face to face counsel of the patient.    Electronically signed by:  Odette Fraction, MD Infectious Disease Physician [facility] [address] [place], [place] [address] Phone: [phone]  Fax: [phone]

## 2020-06-17 NOTE — Assessment & Plan Note (Signed)
Continue Bactrim. Plan to continue until end of March 2022. Fu in 1 month

## 2020-06-17 NOTE — Assessment & Plan Note (Signed)
CBC and CMP today

## 2020-07-17 ENCOUNTER — Ambulatory Visit: Payer: Medicare HMO | Admitting: Infectious Diseases

## 2020-07-22 ENCOUNTER — Other Ambulatory Visit: Payer: Self-pay

## 2020-07-22 ENCOUNTER — Ambulatory Visit: Payer: Medicare HMO | Admitting: Infectious Diseases

## 2020-07-22 ENCOUNTER — Ambulatory Visit
Admission: RE | Admit: 2020-07-22 | Discharge: 2020-07-22 | Disposition: A | Discharge status: Home / Self Care | Payer: Medicare HMO | Source: Ambulatory Visit | Attending: Infectious Diseases | Admitting: Infectious Diseases

## 2020-07-22 ENCOUNTER — Encounter: Payer: Self-pay | Admitting: Infectious Diseases

## 2020-07-22 VITALS — BP 151/83 | HR 56 | Temp 97.3°F | Ht 67.0 in | Wt 139.0 lb

## 2020-07-22 DIAGNOSIS — A430 Pulmonary nocardiosis: Secondary | ICD-10-CM | POA: Diagnosis not present

## 2020-07-22 IMAGING — CR DG CHEST 2V
2 series · 2 of 2 positions shown · non-contrast
Comparison: Radiographs [DATE] and [DATE]. Outside CT
[DATE].

CLINICAL DATA: History of atypical lung infection. Nocardia
pneumonia. No current complaints.

EXAM:
CHEST - 2 VIEW

[w chest pa]
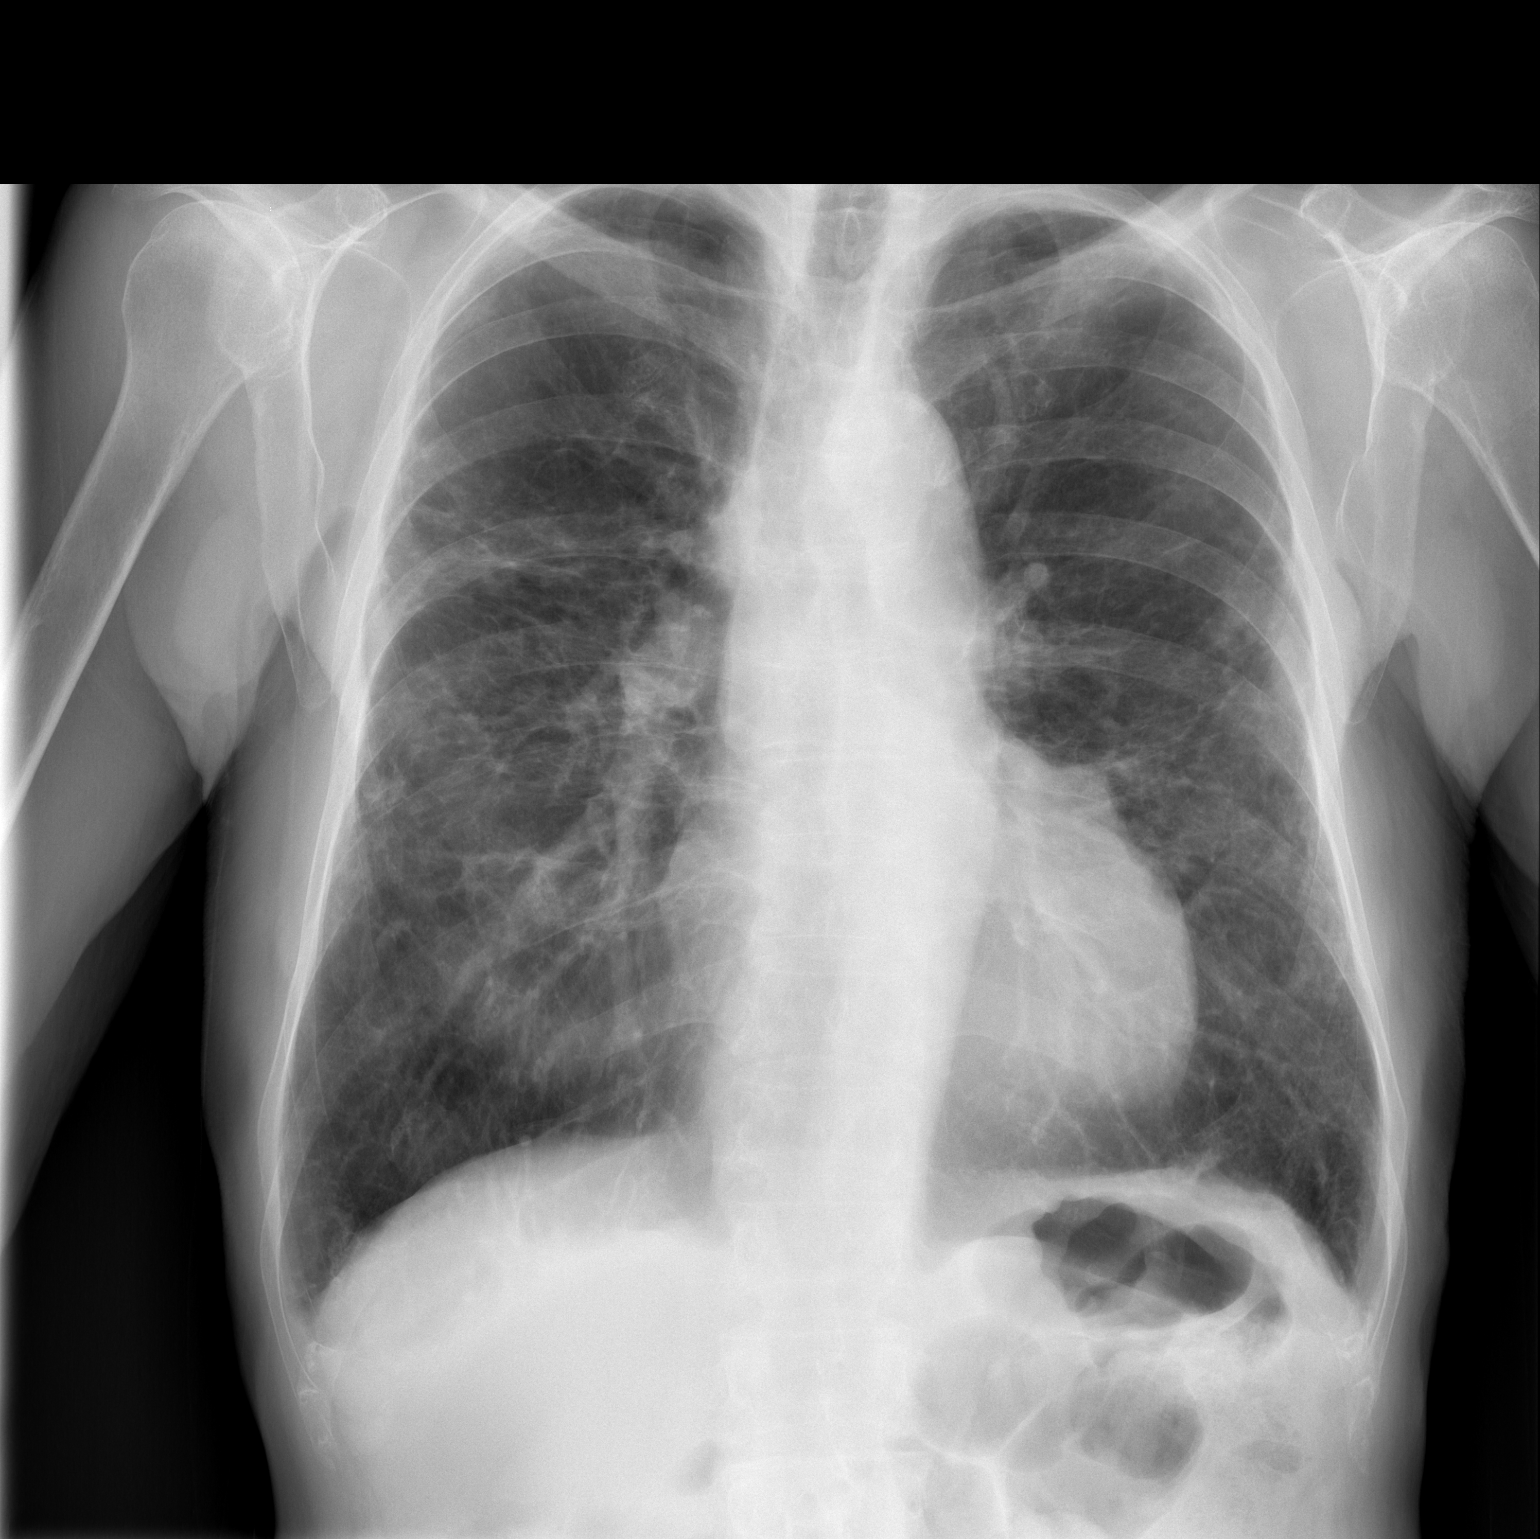

[w chest lat]
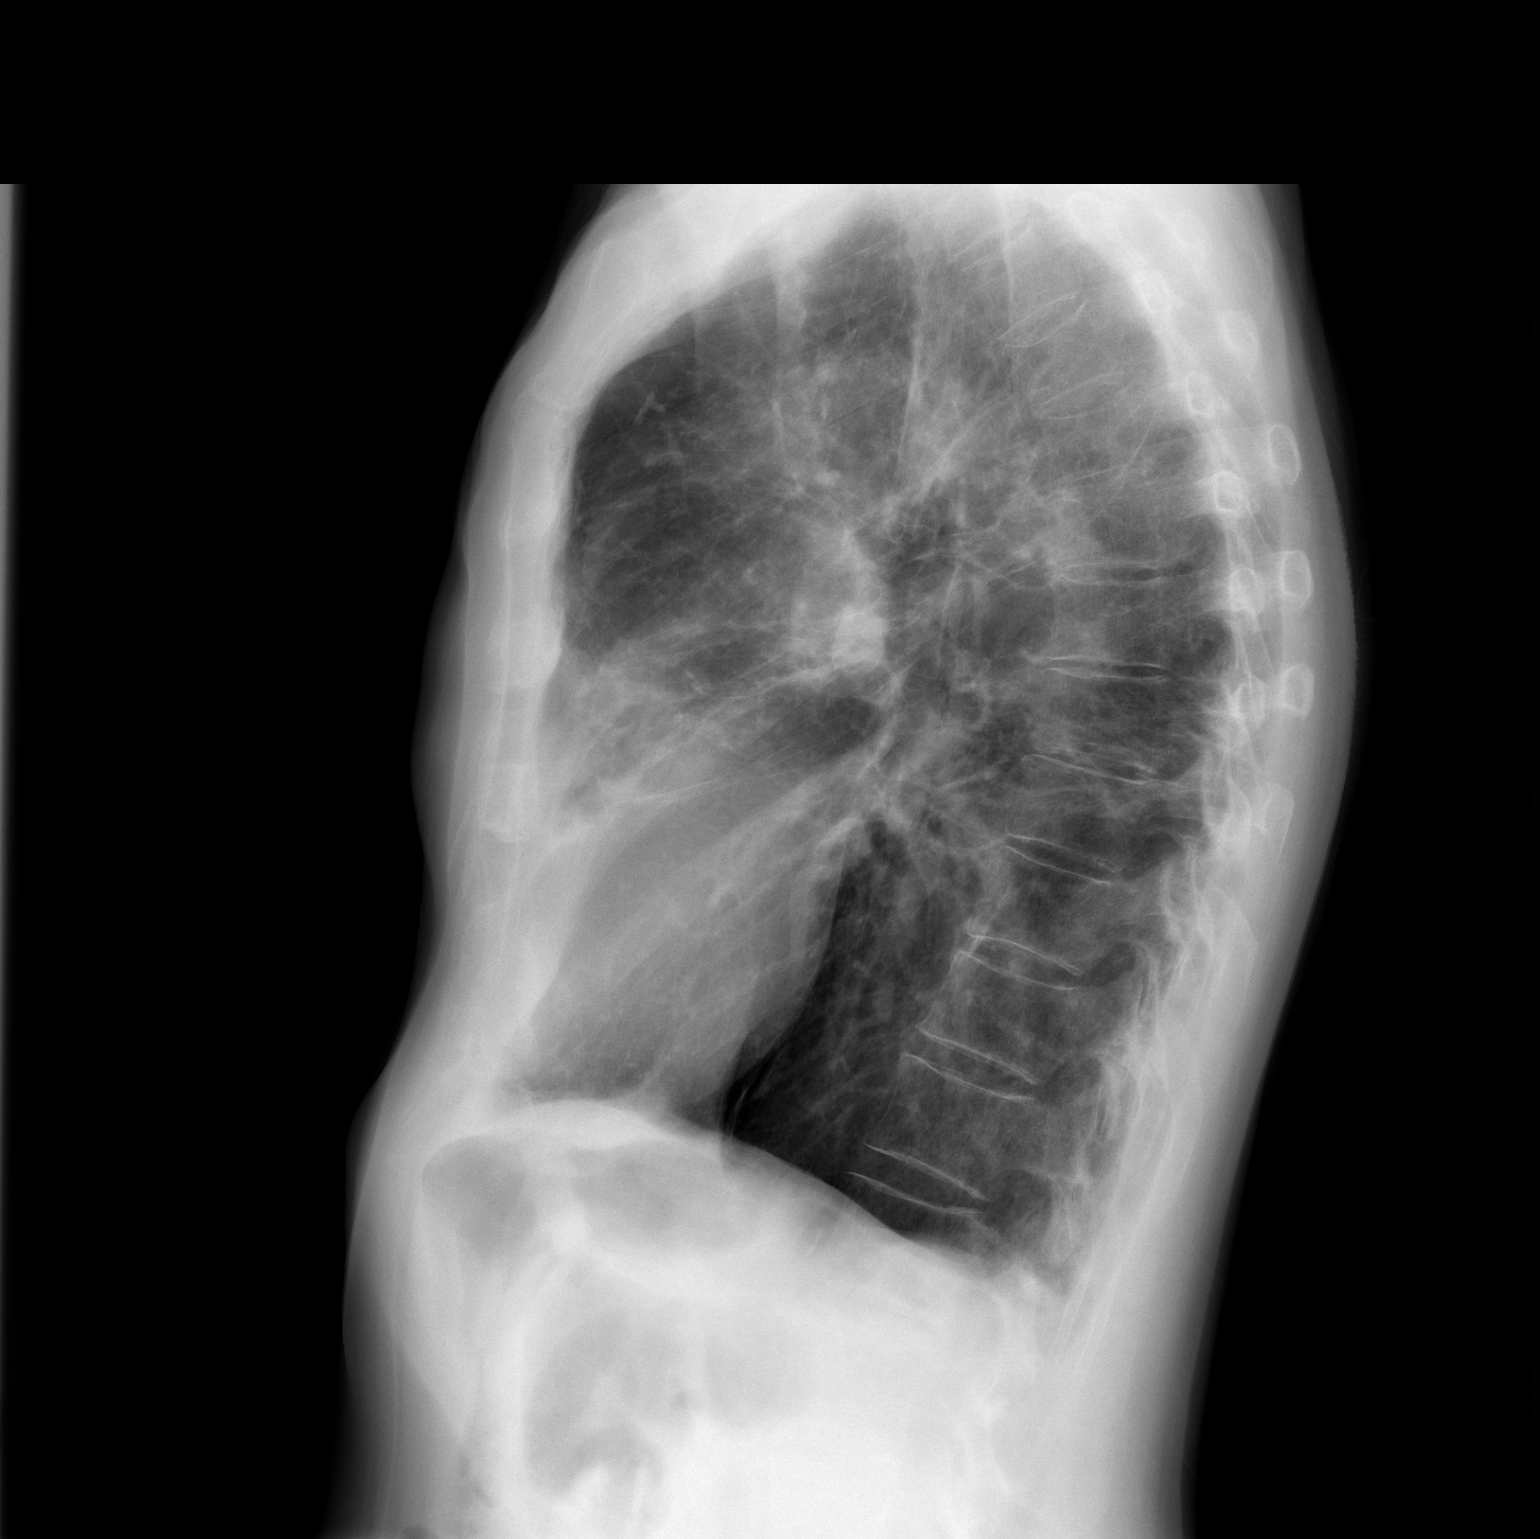

[2 of 2 positions shown; findings below may reference images not displayed]

FINDINGS: The heart size is stable at the upper limits of normal. There is
aortic atherosclerosis. There is stable chronic lung disease with
architectural distortion, traction bronchiectasis and scattered
ill-defined pulmonary nodularity. No developing dominant nodule or
airspace disease identified. There is no pleural effusion or
pneumothorax. The bones appear unchanged.
IMPRESSION: Stable chronic lung disease with architectural distortion, traction
bronchiectasis and scattered ill-defined pulmonary nodularity
compatible with chronic atypical infection. No acute findings
demonstrated.

## 2020-07-22 NOTE — Progress Notes (Signed)
[facility]                                                             [address], [place], [place], [address]                                                                  Phn. 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 SOB stable Bronchiectasis Ex Smoker  Splenectomy- patient confirms he has received all recommended vaccine for splenectomy ( H influenzae, Pneumococcal and Meningococcus vaccines)  Plan Missed approximately a week worth of bactrim from march 2- July 22, 2020. Discussed with patient regarding restarting Bactrim as is for one more month to complete 6 months of therapy total. Tentative end date is early April. Will make a fu in 1 month around end of treatment  CBC and CMP for medication monitoring  Chest xray 2 views  Follow up in 1 month  All questions and concerns were discussed and addressed. Patient verbalized understanding of the plan. ____________________________________________________________________________________________________________________  Subjective/Interval events 03/07/20 Patient is here for follow up for Pulmonary Nocardia. Bactrim was stopped on 10/6 given K of 6/Na 129. It was resumed on 10/18 after repeat NA and K were WNL. Losartan has been stopped as well as his BP is in the lower side. He has started taking bactrim since 10/18 and denies any side effects like nausea, vomiting, abdominal pain, diarrhea and rashes/itching. Discussed about MRI brain results being unremarkable. Wife wants to do BMP more often so that she does not have to end up going to  the ED.   Patient says cough is not much. Few bouts in the day. It does not bother him much. It is non productive and has not been able to being a sputum sample I requested last visit. He feels SOB when he walks in a hill nearby their house and thinks it is age related. He overall feels the same.  04/14/20 Patient is here for follow up for Pulmonary Nocardia. Bactrim wasinitiallystopped on 10/6 given K of 6/Na 129. It was resumed on 10/18 after repeat NA and K were WNL. He has started taking bactrim since 10/18. Dosing of batrim was reduced from BID to once daily dosing given AKI of 1.29. he has not missed any doses. He says overall he feels so better after he has started taking the bactrim and wants to continue  taking it. Denies any phlegm prodcution, He really does not have a lot of SOB to being with. He feels SOB after walking uphills to which he has not noted any significant difference.   He has fallen a couple of times in the last month after feeling dizzy. He says he feels dizzy after standing from a sitting position ?Orthostatic hypotension. Discussed about preventive measures and adequate hydration. Overall feels stable.  05/20/20 Patient by himself todayaswifeishaving cataract sx tomorrow. He is taking bactrim 1 tab dailyand trying to drink fluid as much as possible. He has missedonly one dose of bactrim sincelast clinic visit. No issues with the bactrim like N/V/D/rashes and itching.He feels better every day. Cough is better.he has mild cough,very little in the morningwith minimal phlegm. SOBis presentwhen walking uphills and probablythinks ithasgotten worse. He has a follow up with Dr Celine Mans in spring 2022.Willing to get PCV 23 shot today.  Denies fever/chills/sweats. Denies nausea/vomiting/diarrhea. Denies urinary symptoms. Appetite is good.  06/17/20 Gilmer Mor is here with his wife for follow up of Nocardia pneumonia. He says he feels a lot better since he  has been started on Bactrim and would rate it as 3/10 before starting bactrim and now its around 7/10  in terms of his feeling good and severity of cough. He says SOB has also been better except he feels SOB when wearing mask. He also feels winded sometimes while walking uphills and vaccuming at home. Cough is very minimal if any and is mostly in the morning. Sneezes more, denies any fevers, chills and sweats.   Denies any nausea/vomiting/diarrhea/rashes and allergy.  Follows up with Pulomonology regularly for Bronchiectasis  Overall feels well and no complaints today.  07/22/20 Here for follow up for Nocardia PNA. Wife present. Says he stopped bactrim from march 2 until today as he was tired of taking it. No new symptoms. SOB is stable - feels winded when he walks up hills or steep hills. Coughs up some early in the morning. Denies any nausea, vomiting, abdominal pain and diarrhea. Instructed him to start taking Bactrim as before and continue for 1 more month to which he agreed.   ROS: Constitutional: Negative for fever, chills, activity change, appetite change, fatigue and unexpected weight change.  HENT: Negative for congestion, sore throat, rhinorrhea, sneezing, trouble swallowing and sinus pressure.  Eyes: Negative for photophobia and visual disturbance.  Respiratory: Negative for cough, chest tightness, shortness of breath, wheezing and stridor.  Cardiovascular: Negative for chest pain, palpitations and leg swelling.  Gastrointestinal: Negative for nausea, vomiting, abdominal pain, diarrhea, constipation, blood in stool, abdominal distention and anal bleeding.  Genitourinary: Negative for dysuria, hematuria, flank pain and difficulty urinating.  Musculoskeletal: Negative for myalgias, back pain, joint swelling, arthralgias and gait problem.  Skin: Negative for color change, pallor, rash and wound.  Neurological: Negative for dizziness, tremors, weakness and light-headedness.   Hematological: Negative for adenopathy. Does not bruise/bleed easily.  Psychiatric/Behavioral: Negative for behavioral problems, confusion, sleep disturbance, dysphoric mood, decreased concentration and agitation.   Past Medical History:  Diagnosis Date  . Enlarged prostate   . Hypertension   . Renal stones    Past Surgical History:  Procedure Laterality Date  . APPENDECTOMY    . CHOLECYSTECTOMY    . NOSE SURGERY    . SPLENECTOMY, TOTAL     Current Outpatient Medications on File Prior to Visit  Medication Sig Dispense Refill  . acetaminophen (TYLENOL) 325 MG tablet Take 325-650 mg by mouth every 6 (six) hours  as needed for moderate pain.     Marland Kitchen Alum Hydroxide-Mag Carbonate (ACID GONE ANTACID PO) Take 1-2 tablets by mouth daily as needed (indigestion).    . calcium carbonate (OS-CAL) 1250 (500 Ca) MG chewable tablet Chew by mouth.    . Cholecalciferol 10 MCG (400 UNIT) CAPS Take 400 Units by mouth daily.    . sodium chloride (OCEAN) 0.65 % nasal spray Place 1 spray into the nose daily as needed for congestion.     Marland Kitchen aspirin 81 MG EC tablet Take by mouth. (Patient not taking: No sig reported)    . Respiratory Therapy Supplies (FLUTTER) DEVI 1 puff by Does not apply route in the morning and at bedtime. (Patient not taking: No sig reported) 1 each 0  . sodium chloride HYPERTONIC 3 % nebulizer solution Take by nebulization as needed for other. (Patient not taking: No sig reported) 750 mL 12  . sulfamethoxazole-trimethoprim (BACTRIM DS) 800-160 MG tablet Take 1 tablet by mouth daily. (Patient not taking: Reported on 07/22/2020) 30 tablet 2   No current facility-administered medications on file prior to visit.   Allergies  Allergen Reactions  . Codeine    Social History   Socioeconomic History  . Marital status: Married    Spouse name: Not on file  . Number of children: Not on file  . Years of education: Not on file  . Highest education level: Not on file  Occupational History  .  Not on file  Tobacco Use  . Smoking status: Former Smoker    Packs/day: 1.00    Years: 30.00    Pack years: 30.00    Types: Cigarettes    Quit date: 10/07/1984    Years since quitting: 35.8  . Smokeless tobacco: Never Used  Substance and Sexual Activity  . Alcohol use: Never  . Drug use: Never  . Sexual activity: Not on file  Other Topics Concern  . Not on file  Social History Narrative  . Not on file   Social Determinants of Health   Financial Resource Strain: Not on file  Food Insecurity: Not on file  Transportation Needs: Not on file  Physical Activity: Not on file  Stress: Not on file  Social Connections: Not on file  Intimate Partner Violence: Not on file   Allergies  Allergen Reactions  . Codeine    Social History   Socioeconomic History  . Marital status: Married    Spouse name: Not on file  . Number of children: Not on file  . Years of education: Not on file  . Highest education level: Not on file  Occupational History  . Not on file  Tobacco Use  . Smoking status: Former Smoker    Packs/day: 1.00    Years: 30.00    Pack years: 30.00    Types: Cigarettes    Quit date: 10/07/1984    Years since quitting: 35.8  . Smokeless tobacco: Never Used  Substance and Sexual Activity  . Alcohol use: Never  . Drug use: Never  . Sexual activity: Not on file  Other Topics Concern  . Not on file  Social History Narrative  . Not on file   Social Determinants of Health   Financial Resource Strain: Not on file  Food Insecurity: Not on file  Transportation Needs: Not on file  Physical Activity: Not on file  Stress: Not on file  Social Connections: Not on file  Intimate Partner Violence: Not on file    Vitals BP Marland Kitchen)  151/83   Pulse (!) 56   Temp (!) 97.3 F (36.3 C) (Oral)   Ht 5\' 7"  (1.702 m)   Wt 139 lb (63 kg)   SpO2 98%   BMI 21.77 kg/m    Examination  General - not in acute distress, comfortably sitting in chair, HARD OF HEARING  HEENT -  PEERLA, no pallor and no icterus Chest - b/l clear air entry, no additional sounds CVS- Normal s1s2, RRR Abdomen - Soft, Non tender , non distended Ext- no pedal edema Neuro: grossly normal Back - WNL Psych : calm and cooperative  Recent labs CBC Latest Ref Rng & Units 06/17/2020 05/20/2020 04/14/2020  WBC 3.8 - 10.8 Thousand/uL 7.1 5.5 6.6  Hemoglobin 13.2 - 17.1 g/dL 04/16/2020 78.2 95.6  Hematocrit 38.5 - 50.0 % 39.5 42.0 39.5  Platelets 140 - 400 Thousand/uL 403(H) 409(H) 426(H)   CMP Latest Ref Rng & Units 06/17/2020 06/04/2020 05/20/2020  Glucose 65 - 99 mg/dL 87 90 81  BUN 7 - 25 mg/dL 19 15 16   Creatinine 0.70 - 1.11 mg/dL 07/18/2020 0.86  Sodium 135 - 146 mmol/L 135 132(L) 135  Potassium 3.5 - 5.3 mmol/L 4.6 4.5 5.0  Chloride 98 - 110 mmol/L 100 98 100  CO2 20 - 32 mmol/L 30 29 31   Calcium 8.6 - 10.3 mg/dL 9.6 9.5 9.7  Total Protein 6.1 - 8.1 g/dL 6.9 - 7.2  Total Bilirubin 0.2 - 1.2 mg/dL 0.4 - 0.5  Alkaline Phos 38 - 126 U/L - - -  AST 10 - 35 U/L 20 - 22  ALT 9 - 46 U/L 9 - 11    Pertinent Microbiology Results for orders placed or performed in visit on 11/06/19  AFB Culture & Smear     Status: Abnormal   Collection Time: 11/06/19  3:14 PM   Specimen: Sputum   Sputum  Result Value Ref Range Status   AFB Specimen Processing Concentration  Final   Acid Fast Smear Negative  Final   Acid Fast Culture Positive (A)  Final    Comment: 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     Pertinent Imaging All pertinent labs/Imagings/notes reviewed. All pertinent plain films and CT images have been personally visualized and  interpreted; radiology reports have been reviewed. Decision making incorporated into the Impression / Recommendations.  I spent 30 minutes with the patient including  review of prior medical records with greater than 50% of time in face to face counsel of the patient.    Electronically signed by:  11/08/19, MD Infectious Disease Physician [facility] [address] [place], Odette Fraction [facility] Phone: [phone]  Fax: [phone]

## 2020-07-23 LAB — COMPREHENSIVE METABOLIC PANEL
AG Ratio: 1.2 (calc) (ref 1.0–2.5)
ALT: 9 U/L (ref 9–46)
AST: 20 U/L (ref 10–35)
Albumin: 3.8 g/dL (ref 3.6–5.1)
Alkaline phosphatase (APISO): 60 U/L (ref 35–144)
BUN: 14 mg/dL (ref 7–25)
CO2: 32 mmol/L (ref 20–32)
Calcium: 9.8 mg/dL (ref 8.6–10.3)
Chloride: 99 mmol/L (ref 98–110)
Creat: 0.87 mg/dL (ref 0.70–1.11)
Globulin: 3.2 g/dL (calc) (ref 1.9–3.7)
Glucose, Bld: 81 mg/dL (ref 65–99)
Potassium: 4.8 mmol/L (ref 3.5–5.3)
Sodium: 136 mmol/L (ref 135–146)
Total Bilirubin: 0.6 mg/dL (ref 0.2–1.2)
Total Protein: 7 g/dL (ref 6.1–8.1)

## 2020-07-23 LAB — CBC
HCT: 42 % (ref 38.5–50.0)
Hemoglobin: 14.8 g/dL (ref 13.2–17.1)
MCH: 33 pg (ref 27.0–33.0)
MCHC: 35.2 g/dL (ref 32.0–36.0)
MCV: 93.5 fL (ref 80.0–100.0)
MPV: 9.7 fL (ref 7.5–12.5)
Platelets: 379 10*3/uL (ref 140–400)
RBC: 4.49 10*6/uL (ref 4.20–5.80)
RDW: 11.7 % (ref 11.0–15.0)
WBC: 6.4 10*3/uL (ref 3.8–10.8)

## 2020-08-22 ENCOUNTER — Other Ambulatory Visit: Payer: Self-pay

## 2020-08-22 ENCOUNTER — Ambulatory Visit: Payer: Medicare HMO | Admitting: Infectious Diseases

## 2020-08-22 ENCOUNTER — Encounter: Payer: Self-pay | Admitting: Infectious Diseases

## 2020-08-22 VITALS — BP 134/80 | HR 67 | Temp 97.7°F | Wt 139.0 lb

## 2020-08-22 DIAGNOSIS — A430 Pulmonary nocardiosis: Secondary | ICD-10-CM | POA: Diagnosis not present

## 2020-08-22 NOTE — Progress Notes (Signed)
[facility]                                                             [address], [place], [place], [address]                                                                  Phn. 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 SOB stable Bronchiectasis Ex Smoker  Splenectomy  Plan Will DC bactrim today as he has completed approx 6 months of tx for Nocardia PNA. Chest xray with stable findings. Fu as needed   All questions and concerns were discussed and addressed. Patient verbalized understanding of the plan. ____________________________________________________________________________________________________________________  Subjective/Interval events 03/07/20 Patient is here for follow up for Pulmonary Nocardia. Bactrim was stopped on 10/6 given K of 6/Na 129. It was resumed on 10/18 after repeat NA and K were WNL. Losartan has been stopped as well as his BP is in the lower side. He has started taking bactrim since 10/18 and denies any side effects like nausea, vomiting, abdominal pain, diarrhea and rashes/itching. Discussed about MRI brain results being unremarkable. Wife wants to do BMP more often so that she does not have to end up going to the ED.   Patient says cough is not much. Few bouts in the day. It does not bother him much. It is non productive and has not been able to being a sputum sample I requested last visit. He feels SOB when he walks in a hill nearby their house and thinks it is age related. He overall feels the same.  04/14/20 Patient is here for follow up for Pulmonary  Nocardia. Bactrim wasinitiallystopped on 10/6 given K of 6/Na 129. It was resumed on 10/18 after repeat NA and K were WNL. He has started taking bactrim since 10/18. Dosing of batrim was reduced from BID to once daily dosing given AKI of 1.29. he has not missed any doses. He says overall he feels so better after he has started taking the bactrim and wants to continue taking it. Denies any phlegm prodcution, He really does not have a lot of SOB to being with. He feels SOB after walking uphills to which he has not noted any significant difference.   He has fallen a couple of times in the last month after feeling dizzy. He says he feels dizzy after standing from a sitting  position ?Orthostatic hypotension. Discussed about preventive measures and adequate hydration. Overall feels stable.  05/20/20 Patient by himself todayaswifeishaving cataract sx tomorrow. He is taking bactrim 1 tab dailyand trying to drink fluid as much as possible. He has missedonly one dose of bactrim sincelast clinic visit. No issues with the bactrim like N/V/D/rashes and itching.He feels better every day. Cough is better.he has mild cough,very little in the morningwith minimal phlegm. SOBis presentwhen walking uphills and probablythinks ithasgotten worse. He has a follow up with Dr Celine Mans in spring 2022.Willing to get PCV 23 shot today.  Denies fever/chills/sweats. Denies nausea/vomiting/diarrhea. Denies urinary symptoms. Appetite is good.  06/17/20 Samuel Mata is here with his wife for follow up of Nocardia pneumonia. He says he feels a lot better since he has been started on Bactrim and would rate it as 3/10 before starting bactrim and now its around 7/10  in terms of his feeling good and severity of cough. He says SOB has also been better except he feels SOB when wearing mask. He also feels winded sometimes while walking uphills and vaccuming at home. Cough is very minimal if any and is mostly in the morning.  Sneezes more, denies any fevers, chills and sweats.   Denies any nausea/vomiting/diarrhea/rashes and allergy.  Follows up with Pulomonology regularly for Bronchiectasis  Overall feels well and no complaints today.  07/22/20 Here for follow up for Nocardia PNA. Wife present. Says he stopped bactrim from march 2 until today as he was tired of taking it. No new symptoms. SOB is stable - feels winded when he walks up hills or steep hills. Coughs up some early in the morning. Denies any nausea, vomiting, abdominal pain and diarrhea. Instructed him to start taking Bactrim as before and continue for 1 more month to which he agreed.   08/22/20 Here for follow up for Nocardia PNA. Took last dose of Bactrim today. No more cough except some minimal cough in the morning. No changes in breathing. Denies fevers , chills and sweats. But feels congested at times. Appetite is good and weight has been the same. He is very happy that he has completed the treatment successfully.   ROS: Constitutional: Negative for fever, chills, activity change, appetite change, fatigue and unexpected weight change.  HENT: Negative for sore throat, rhinorrhea, sneezing, trouble swallowing and sinus pressure.  Eyes: Negative for photophobia and visual disturbance.  Respiratory: Negative for cough, chest tightness, shortness of breath, wheezing and stridor.  Cardiovascular: Negative for chest pain, palpitations and leg swelling.  Gastrointestinal: Negative for nausea, vomiting, abdominal pain, diarrhea, constipation, blood in stool, abdominal distention and anal bleeding.  Genitourinary: Negative for dysuria, hematuria, flank pain and difficulty urinating.  Musculoskeletal: Negative for myalgias, back pain, joint swelling, arthralgias and gait problem.  Skin: Negative for color change, pallor, rash and wound.  Neurological: Negative for dizziness, tremors, weakness and light-headedness.  Hematological: Negative for adenopathy. Does  not bruise/bleed easily.  Psychiatric/Behavioral: Negative for behavioral problems, confusion, sleep disturbance, dysphoric mood, decreased concentration and agitation.   Past Medical History:  Diagnosis Date  . Enlarged prostate   . Hypertension   . Renal stones    Past Surgical History:  Procedure Laterality Date  . APPENDECTOMY    . CHOLECYSTECTOMY    . NOSE SURGERY    . SPLENECTOMY, TOTAL     Current Outpatient Medications on File Prior to Visit  Medication Sig Dispense Refill  . acetaminophen (TYLENOL) 325 MG tablet Take 325-650 mg by mouth every 6 (six)  hours as needed for moderate pain.     Marland Kitchen Alum Hydroxide-Mag Carbonate (ACID GONE ANTACID PO) Take 1-2 tablets by mouth daily as needed (indigestion).    Marland Kitchen aspirin 81 MG EC tablet Take by mouth.    . calcium carbonate (OS-CAL) 1250 (500 Ca) MG chewable tablet Chew by mouth.    . Cholecalciferol 10 MCG (400 UNIT) CAPS Take 400 Units by mouth daily.    Marland Kitchen Respiratory Therapy Supplies (FLUTTER) DEVI 1 puff by Does not apply route in the morning and at bedtime. 1 each 0  . sodium chloride (OCEAN) 0.65 % nasal spray Place 1 spray into the nose daily as needed for congestion.     . sodium chloride HYPERTONIC 3 % nebulizer solution Take by nebulization as needed for other. 750 mL 12   No current facility-administered medications on file prior to visit.    Allergies  Allergen Reactions  . Codeine    Social History   Socioeconomic History  . Marital status: Married    Spouse name: Not on file  . Number of children: Not on file  . Years of education: Not on file  . Highest education level: Not on file  Occupational History  . Not on file  Tobacco Use  . Smoking status: Former Smoker    Packs/day: 1.00    Years: 30.00    Pack years: 30.00    Types: Cigarettes    Quit date: 10/07/1984    Years since quitting: 35.8  . Smokeless tobacco: Never Used  Substance and Sexual Activity  . Alcohol use: Never  . Drug use: Never  .  Sexual activity: Not on file  Other Topics Concern  . Not on file  Social History Narrative  . Not on file   Social Determinants of Health   Financial Resource Strain: Not on file  Food Insecurity: Not on file  Transportation Needs: Not on file  Physical Activity: Not on file  Stress: Not on file  Social Connections: Not on file  Intimate Partner Violence: Not on file   Allergies  Allergen Reactions  . Codeine    Social History   Socioeconomic History  . Marital status: Married    Spouse name: Not on file  . Number of children: Not on file  . Years of education: Not on file  . Highest education level: Not on file  Occupational History  . Not on file  Tobacco Use  . Smoking status: Former Smoker    Packs/day: 1.00    Years: 30.00    Pack years: 30.00    Types: Cigarettes    Quit date: 10/07/1984    Years since quitting: 35.8  . Smokeless tobacco: Never Used  Substance and Sexual Activity  . Alcohol use: Never  . Drug use: Never  . Sexual activity: Not on file  Other Topics Concern  . Not on file  Social History Narrative  . Not on file   Social Determinants of Health   Financial Resource Strain: Not on file  Food Insecurity: Not on file  Transportation Needs: Not on file  Physical Activity: Not on file  Stress: Not on file  Social Connections: Not on file  Intimate Partner Violence: Not on file    Vitals Wt 139 lb (63 kg)   BMI 21.77 kg/m  '  Examination  General - not in acute distress, comfortably sitting in chair HEENT - PEERLA, no pallor and no icterus Chest - b/l clear air entry, no additional  sounds CVS- Normal s1s2, RRR Abdomen - Soft, Non tender , non distended Ext- no pedal edema Neuro: grossly normal Back - WNL Psych : calm and cooperative  Recent labs CBC Latest Ref Rng & Units 07/22/2020 06/17/2020 05/20/2020  WBC 3.8 - 10.8 Thousand/uL 6.4 7.1 5.5  Hemoglobin 13.2 - 17.1 g/dL 16.1 09.6 04.5  Hematocrit 38.5 - 50.0 % 42.0 39.5 42.0   Platelets 140 - 400 Thousand/uL 379 403(H) 409(H)   CMP Latest Ref Rng & Units 07/22/2020 06/17/2020 06/04/2020  Glucose 65 - 99 mg/dL 81 87 90  BUN 7 - 25 mg/dL 14 19 15   Creatinine 0.70 - 1.11 mg/dL 4.09 8.11  Sodium 135 - 146 mmol/L 136 135 132(L)  Potassium 3.5 - 5.3 mmol/L 4.8 4.6 4.5  Chloride 98 - 110 mmol/L 99 100 98  CO2 20 - 32 mmol/L 32 30 29  Calcium 8.6 - 10.3 mg/dL 9.8 9.6 9.5  Total Protein 6.1 - 8.1 g/dL 7.0 6.9 -  Total Bilirubin 0.2 - 1.2 mg/dL 0.6 0.4 -  Alkaline Phos 38 - 126 U/L - - -  AST 10 - 35 U/L 20 20 -  ALT 9 - 46 U/L 9 9 -    Pertinent Microbiology Results for orders placed or performed in visit on 11/06/19  AFB Culture & Smear     Status: Abnormal   Collection Time: 11/06/19  3:14 PM   Specimen: Sputum   Sputum  Result Value Ref Range Status   AFB Specimen Processing Concentration  Final   Acid Fast Smear Negative  Final   Acid Fast Culture Positive (A)  Final    Comment: 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     Pertinent Imaging All pertinent labs/Imagings/notes reviewed. All pertinent plain films and CT images have been personally visualized and interpreted; radiology reports have been reviewed. Decision making incorporated into the Impression / Recommendations.  I spent 30 minutes with the patient including  review of prior medical records with greater than 50% of time in face to face counsel of the patient.    Electronically signed by:  11/08/19, MD Infectious Disease Physician [facility] [address] [place], [place] Phone: [phone]  Fax: [phone]

## 2020-08-23 LAB — BASIC METABOLIC PANEL
BUN: 17 mg/dL (ref 7–25)
CO2: 29 mmol/L (ref 20–32)
Calcium: 9.3 mg/dL (ref 8.6–10.3)
Chloride: 101 mmol/L (ref 98–110)
Creat: 1.03 mg/dL (ref 0.70–1.11)
Glucose, Bld: 85 mg/dL (ref 65–99)
Potassium: 4.8 mmol/L (ref 3.5–5.3)
Sodium: 135 mmol/L (ref 135–146)

## 2020-08-23 LAB — CBC
HCT: 39 % (ref 38.5–50.0)
Hemoglobin: 13.7 g/dL (ref 13.2–17.1)
MCH: 32.6 pg (ref 27.0–33.0)
MCHC: 35.1 g/dL (ref 32.0–36.0)
MCV: 92.9 fL (ref 80.0–100.0)
MPV: 9.7 fL (ref 7.5–12.5)
Platelets: 364 10*3/uL (ref 140–400)
RBC: 4.2 10*6/uL (ref 4.20–5.80)
RDW: 11.7 % (ref 11.0–15.0)
WBC: 5.9 10*3/uL (ref 3.8–10.8)

## 2020-08-25 ENCOUNTER — Telehealth: Payer: Self-pay

## 2020-08-25 NOTE — Telephone Encounter (Signed)
-----   Message from Odette Fraction, MD sent at 08/25/2020  8:02 AM EDT ----- Please let patient know that labs done at last clinic visit was all normal and good.

## 2020-08-25 NOTE — Telephone Encounter (Signed)
Spoke with patient regarding lab results. Does not have any questions at this time. Will follow up as needed.

## 2020-08-25 NOTE — Progress Notes (Signed)
Please let patient know that labs done at last clinic visit was all normal and good.

## 2021-01-13 ENCOUNTER — Emergency Department (HOSPITAL_COMMUNITY): Payer: Medicare HMO

## 2021-01-13 ENCOUNTER — Other Ambulatory Visit: Payer: Self-pay

## 2021-01-13 ENCOUNTER — Inpatient Hospital Stay (HOSPITAL_COMMUNITY)
Admission: EM | Admit: 2021-01-13 | Discharge: 2021-01-15 | DRG: 040 | Disposition: A | Discharge status: Home Health | Payer: Medicare HMO | Attending: Neurology | Admitting: Neurology

## 2021-01-13 ENCOUNTER — Inpatient Hospital Stay (HOSPITAL_COMMUNITY): Payer: Medicare HMO

## 2021-01-13 DIAGNOSIS — R4701 Aphasia: Secondary | ICD-10-CM

## 2021-01-13 DIAGNOSIS — I639 Cerebral infarction, unspecified: Secondary | ICD-10-CM | POA: Diagnosis present

## 2021-01-13 DIAGNOSIS — R29703 NIHSS score 3: Secondary | ICD-10-CM | POA: Diagnosis present

## 2021-01-13 DIAGNOSIS — Z8673 Personal history of transient ischemic attack (TIA), and cerebral infarction without residual deficits: Secondary | ICD-10-CM | POA: Diagnosis not present

## 2021-01-13 DIAGNOSIS — E785 Hyperlipidemia, unspecified: Secondary | ICD-10-CM | POA: Diagnosis present

## 2021-01-13 DIAGNOSIS — I6389 Other cerebral infarction: Secondary | ICD-10-CM | POA: Diagnosis not present

## 2021-01-13 DIAGNOSIS — Z20822 Contact with and (suspected) exposure to covid-19: Secondary | ICD-10-CM | POA: Diagnosis present

## 2021-01-13 DIAGNOSIS — R270 Ataxia, unspecified: Secondary | ICD-10-CM | POA: Diagnosis present

## 2021-01-13 DIAGNOSIS — I7771 Dissection of carotid artery: Secondary | ICD-10-CM | POA: Diagnosis present

## 2021-01-13 DIAGNOSIS — Z9181 History of falling: Secondary | ICD-10-CM | POA: Diagnosis not present

## 2021-01-13 DIAGNOSIS — Z9081 Acquired absence of spleen: Secondary | ICD-10-CM

## 2021-01-13 DIAGNOSIS — Z87891 Personal history of nicotine dependence: Secondary | ICD-10-CM

## 2021-01-13 DIAGNOSIS — I10 Essential (primary) hypertension: Secondary | ICD-10-CM | POA: Diagnosis present

## 2021-01-13 DIAGNOSIS — I6340 Cerebral infarction due to embolism of unspecified cerebral artery: Secondary | ICD-10-CM | POA: Diagnosis present

## 2021-01-13 LAB — COMPREHENSIVE METABOLIC PANEL
ALT: 11 U/L (ref 0–44)
AST: 24 U/L (ref 15–41)
Albumin: 3.4 g/dL — ABNORMAL LOW (ref 3.5–5.0)
Alkaline Phosphatase: 55 U/L (ref 38–126)
Anion gap: 8 (ref 5–15)
BUN: 11 mg/dL (ref 8–23)
CO2: 28 mmol/L (ref 22–32)
Calcium: 9.4 mg/dL (ref 8.9–10.3)
Chloride: 98 mmol/L (ref 98–111)
Creatinine, Ser: 0.83 mg/dL (ref 0.61–1.24)
GFR, Estimated: >60 mL/min (ref >60)
Glucose, Bld: 110 mg/dL — ABNORMAL HIGH (ref 70–99)
Potassium: 3.8 mmol/L (ref 3.5–5.1)
Sodium: 134 mmol/L — ABNORMAL LOW (ref 135–145)
Total Bilirubin: 0.9 mg/dL (ref 0.3–1.2)
Total Protein: 7.1 g/dL (ref 6.5–8.1)

## 2021-01-13 LAB — CBC
HCT: 45 % (ref 39.0–52.0)
Hemoglobin: 15.3 g/dL (ref 13.0–17.0)
MCH: 31.7 pg (ref 26.0–34.0)
MCHC: 34 g/dL (ref 30.0–36.0)
MCV: 93.2 fL (ref 80.0–100.0)
Platelets: 379 10*3/uL (ref 150–400)
RBC: 4.83 MIL/uL (ref 4.22–5.81)
RDW: 11.8 % (ref 11.5–15.5)
WBC: 7.3 10*3/uL (ref 4.0–10.5)
nRBC: 0 % (ref 0.0–0.2)

## 2021-01-13 LAB — I-STAT CHEM 8, ED
BUN: 14 mg/dL (ref 8–23)
Calcium, Ion: 1.13 mmol/L — ABNORMAL LOW (ref 1.15–1.40)
Chloride: 97 mmol/L — ABNORMAL LOW (ref 98–111)
Creatinine, Ser: 0.7 mg/dL (ref 0.61–1.24)
Glucose, Bld: 107 mg/dL — ABNORMAL HIGH (ref 70–99)
HCT: 44 % (ref 39.0–52.0)
Hemoglobin: 15 g/dL (ref 13.0–17.0)
Potassium: 3.9 mmol/L (ref 3.5–5.1)
Sodium: 135 mmol/L (ref 135–145)
TCO2: 30 mmol/L (ref 22–32)

## 2021-01-13 LAB — DIFFERENTIAL
Abs Immature Granulocytes: 0.02 10*3/uL (ref 0.00–0.07)
Basophils Absolute: 0 10*3/uL (ref 0.0–0.1)
Basophils Relative: 1 %
Eosinophils Absolute: 0.3 10*3/uL (ref 0.0–0.5)
Eosinophils Relative: 4 %
Immature Granulocytes: 0 %
Lymphocytes Relative: 27 %
Lymphs Abs: 2 10*3/uL (ref 0.7–4.0)
Monocytes Absolute: 1.2 10*3/uL — ABNORMAL HIGH (ref 0.1–1.0)
Monocytes Relative: 16 %
Neutro Abs: 3.8 10*3/uL (ref 1.7–7.7)
Neutrophils Relative %: 52 %

## 2021-01-13 LAB — PROTIME-INR
INR: 1.1 (ref 0.8–1.2)
Prothrombin Time: 14.2 seconds (ref 11.4–15.2)

## 2021-01-13 LAB — CBG MONITORING, ED: Glucose-Capillary: 115 mg/dL — ABNORMAL HIGH (ref 70–99)

## 2021-01-13 LAB — APTT: aPTT: 31 seconds (ref 24–36)

## 2021-01-13 IMAGING — CT CT HEAD W/O CM
4 series · 16 of 47 positions shown, 18 images · non-contrast
Comparison: CT head [DATE] [DATE] p.m.

CLINICAL DATA: Neuro deficit, acute, stroke suspected TOWER

EXAM:
CT HEAD WITHOUT CONTRAST
TECHNIQUE: Contiguous axial images were obtained from the base of the skull
through the vertex without intravenous contrast.

[Series 3: head without · axial · non-contrast · 0.46mm/px · z∈[+44,+178]mm · 7 of 37 slices shown, 9 images]
[im 5/37  brain]
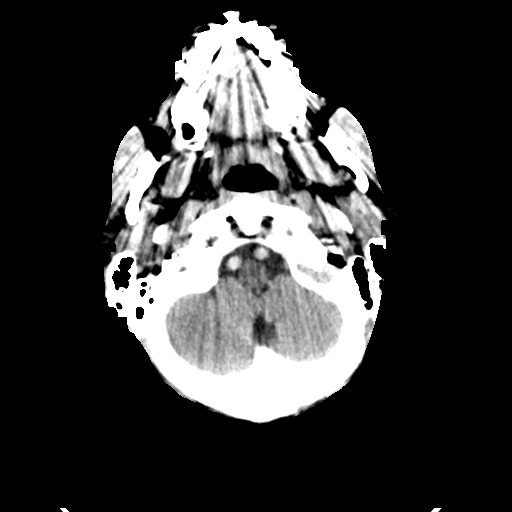
[im 5/37  bone]
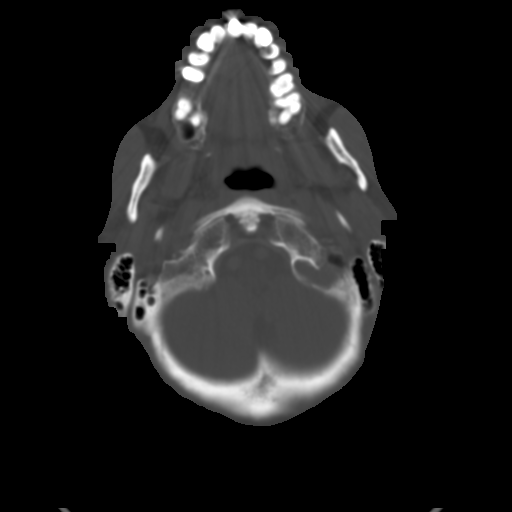
[im 10/37  brain]
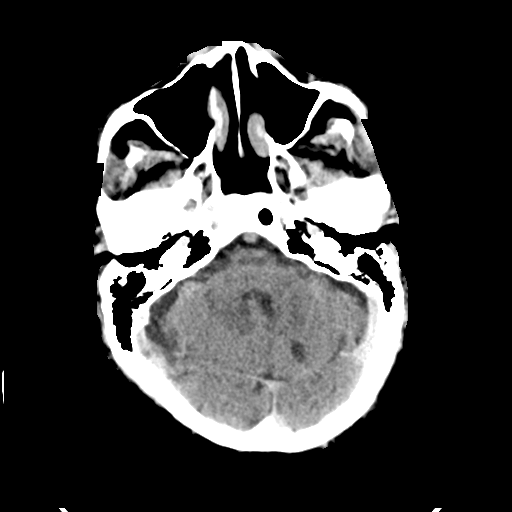
[im 14/37  brain]
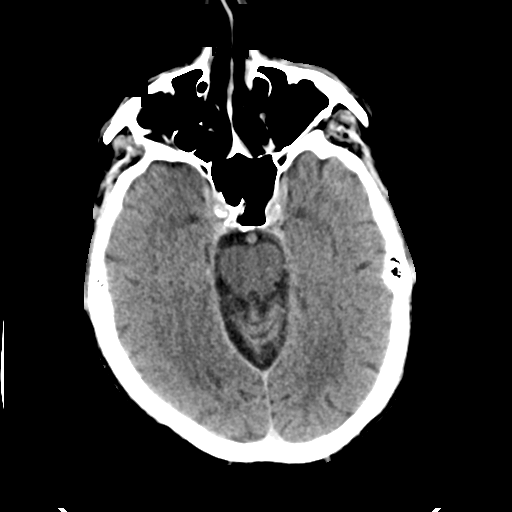
[im 19/37  brain]
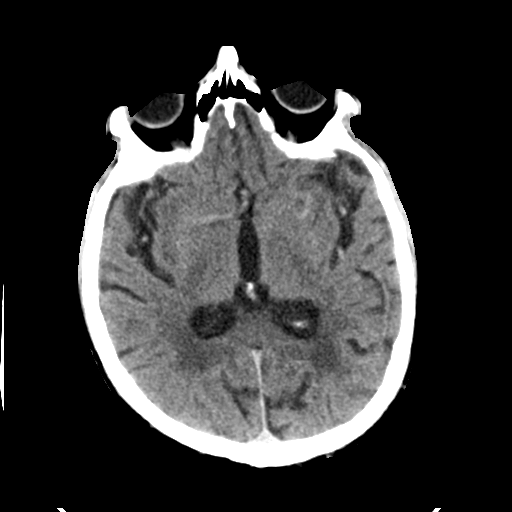
[im 23/37  brain]
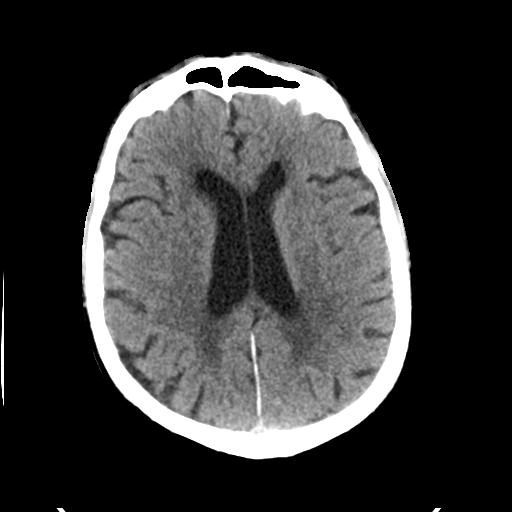
[im 23/37  bone]
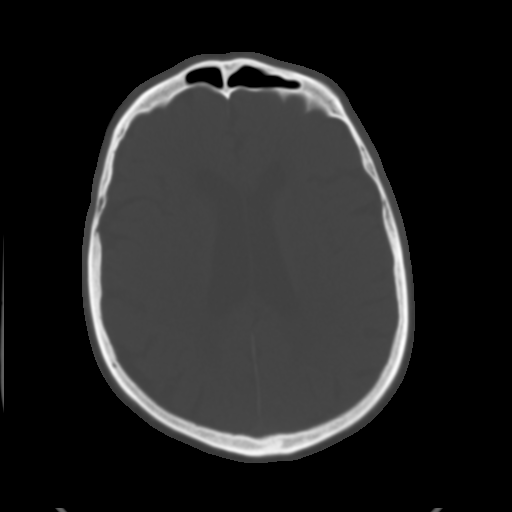
[im 28/37  brain]
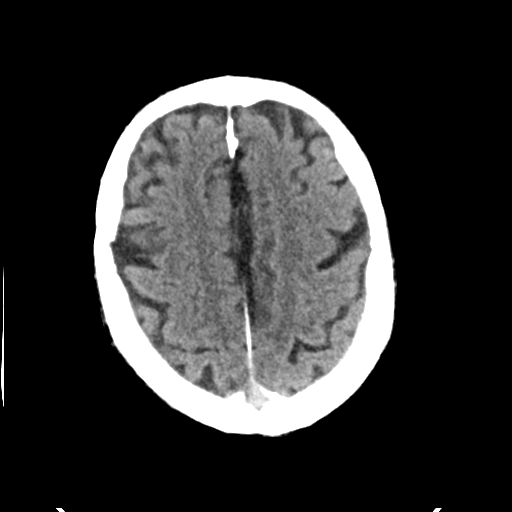
[im 32/37  brain]
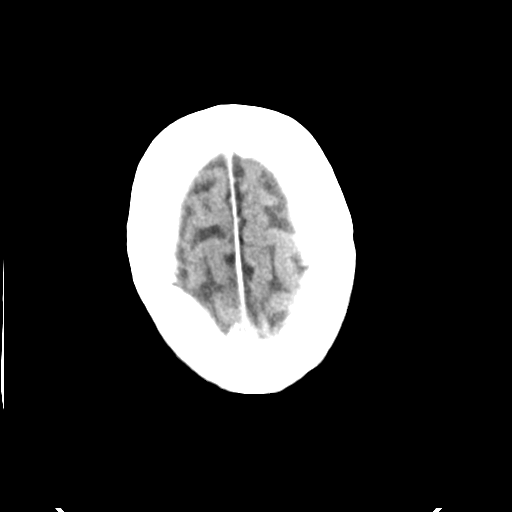

[Series 4: head bone · axial · 0.46mm/px · z∈[+42,+78]mm · 3 of 91 slices shown]
[im 10/91  bone]
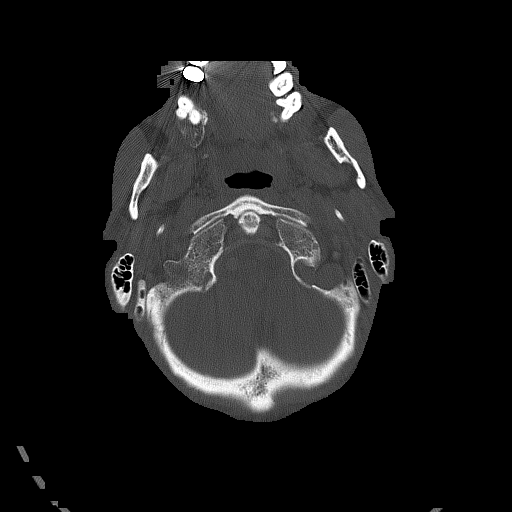
[im 19/91  bone]
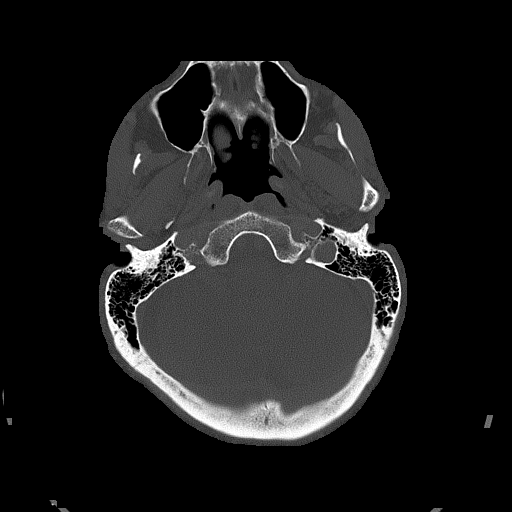
[im 28/91  bone]
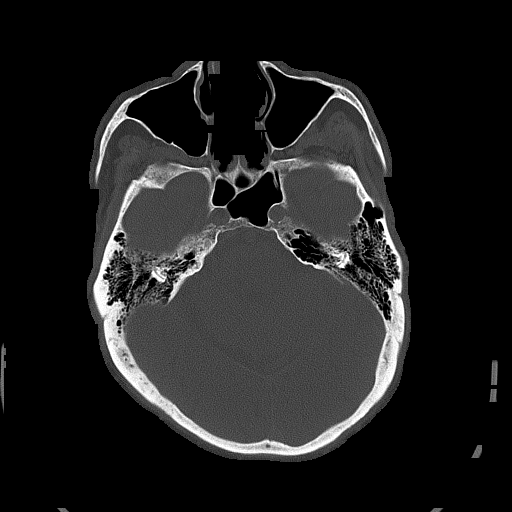

[Series 5: head without cor · coronal · non-contrast · 0.36mm/px · 3 of 62 slices shown]
[im 21/62  brain]
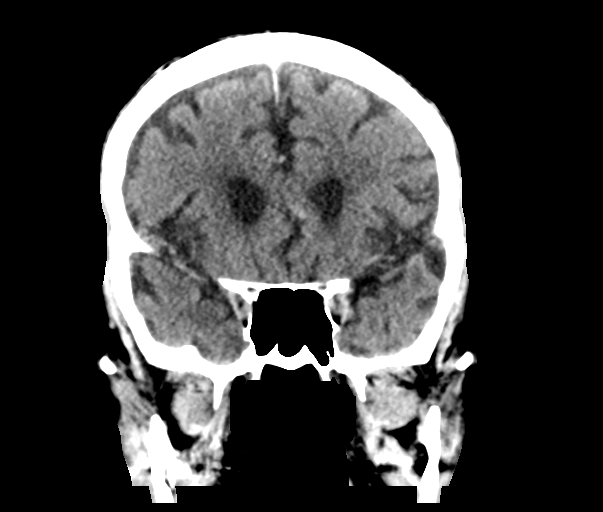
[im 28/62  brain]
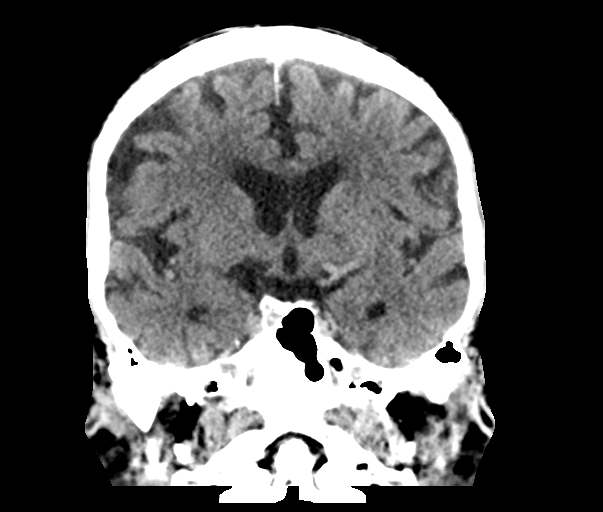
[im 34/62  brain]
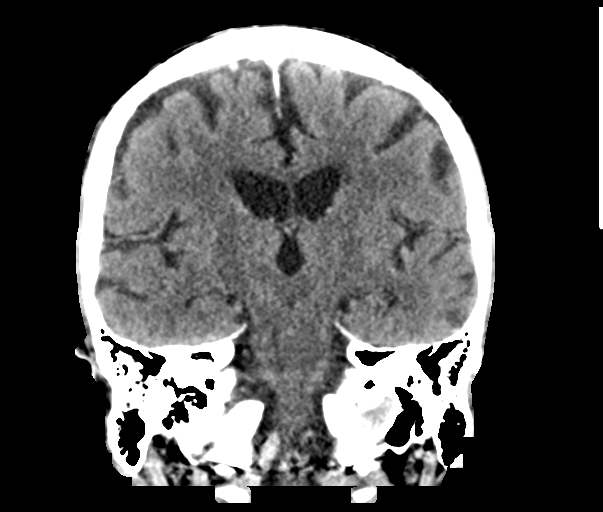

[Series 6: head without sag · sagittal · non-contrast · 0.42mm/px · 3 of 50 slices shown]
[im 17/50  brain]
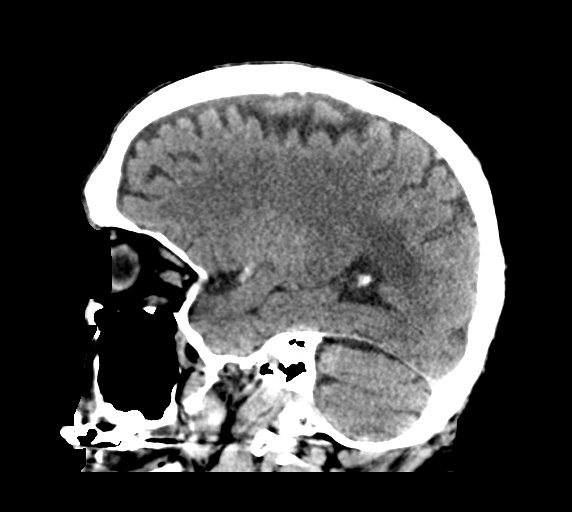
[im 25/50  brain]
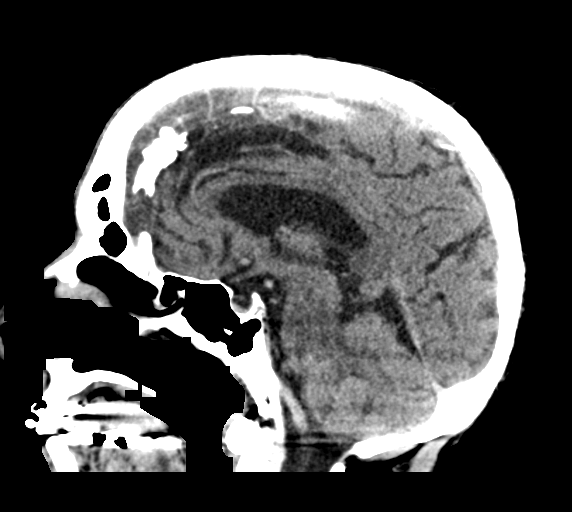
[im 33/50  brain]
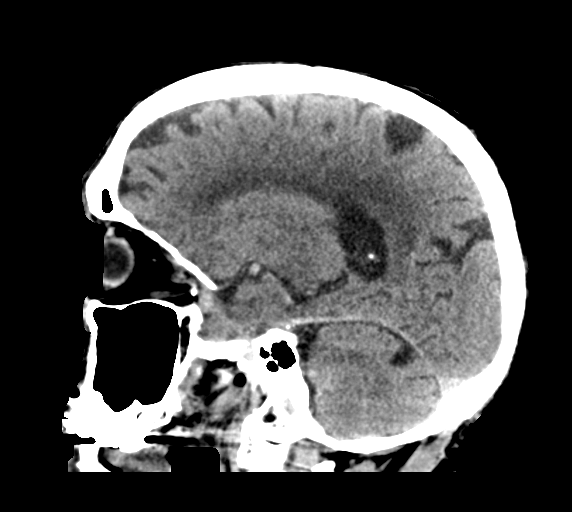

[16 of 47 positions shown; findings below may reference images not displayed]

BRAIN:
BRAIN
Cerebral ventricle sizes are concordant with the degree of cerebral
volume loss. Patchy and confluent areas of decreased attenuation are
noted throughout the deep and periventricular white matter of the
cerebral hemispheres bilaterally, compatible with chronic
microvascular ischemic disease. Chronic lacunar infarction left
cerebellum.

No evidence of large-territorial acute infarction. No parenchymal
hemorrhage. No mass lesion. No extra-axial collection.

No mass effect or midline shift. No hydrocephalus. Basilar cisterns
are patent.

Vascular: No hyperdense vessel. Atherosclerotic calcifications are
present within the cavernous internal carotid arteries.

Skull: No acute fracture or focal lesion.

Sinuses/Orbits: Polypoid mucosal thickening of the left maxillary
sinus and left ethmoid sinus. Paranasal sinuses and mastoid air
cells are clear. The orbits are unremarkable.

Other: None.
IMPRESSION: No acute intracranial abnormality.

## 2021-01-13 IMAGING — CT CT ANGIO HEAD-NECK (W OR W/O PERF)
1 of 8 series · 14 of 47 positions shown · IV contrast (omnipaque)
Comparison: Same-day noncontrast CT head, brain MRI [DATE]

CLINICAL DATA: Neuro deficit

EXAM:
CT ANGIOGRAPHY HEAD AND NECK
TECHNIQUE: Multidetector CT imaging of the head and neck was performed using
the standard protocol during bolus administration of intravenous
contrast. Multiplanar CT image reconstructions and MIPs were
obtained to evaluate the vascular anatomy. Carotid stenosis
measurements (when applicable) are obtained utilizing NASCET
criteria, using the distal internal carotid diameter as the
denominator.
CONTRAST:  100mL OMNIPAQUE IOHEXOL 350 MG/ML SOLN

[Series 7: thin axials · axial · 0.44mm/px · z∈[-303,+16]mm · 14 of 736 slices shown]
[im 50/736  brain]
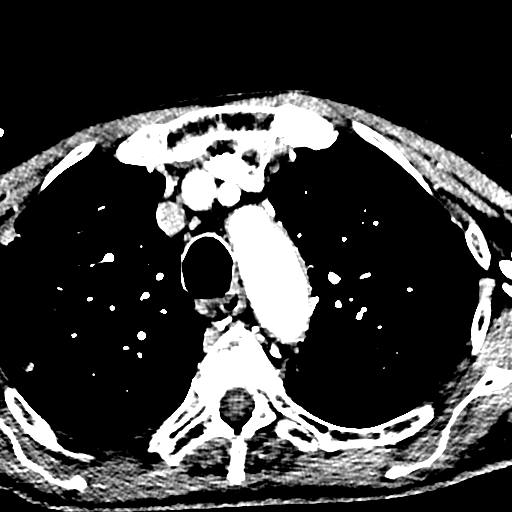
[im 99/736  bone]
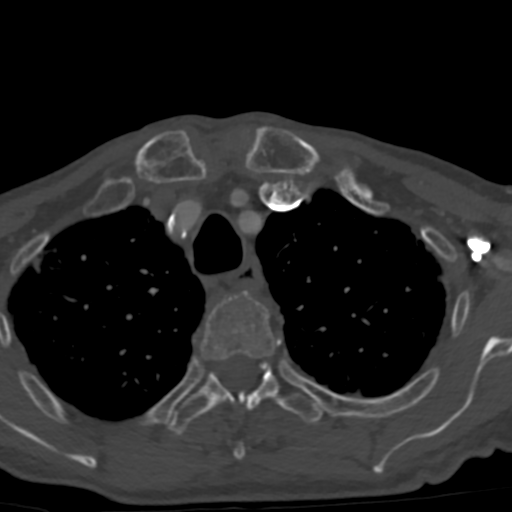
[im 148/736  brain]
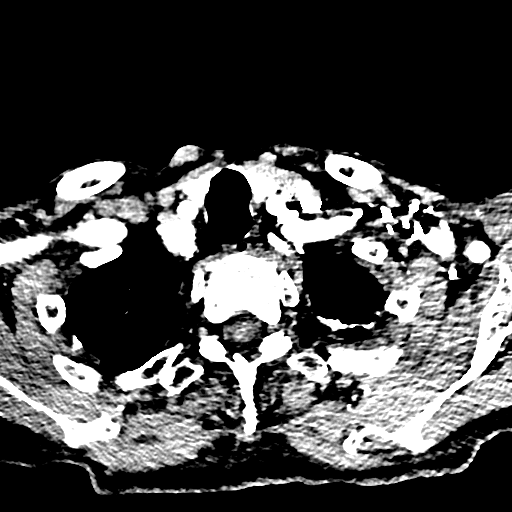
[im 197/736  bone]
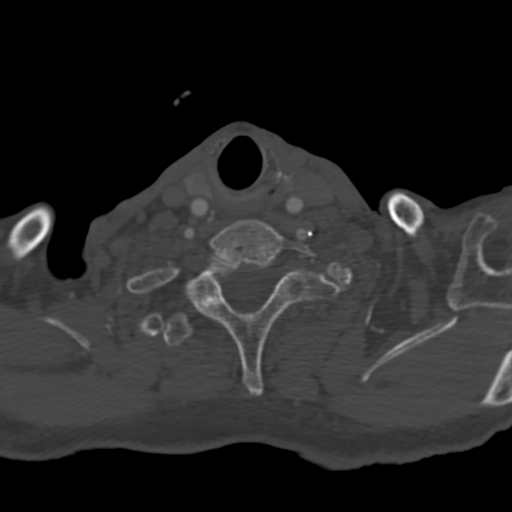
[im 246/736  brain]
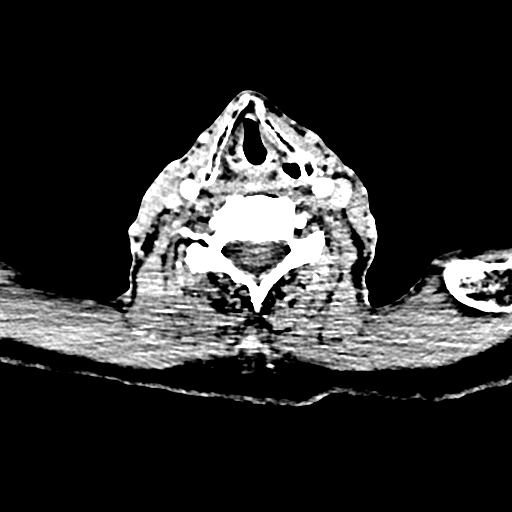
[im 295/736  bone]
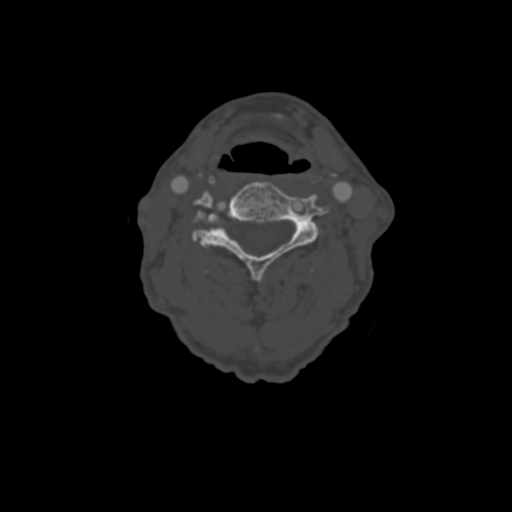
[im 344/736  brain]
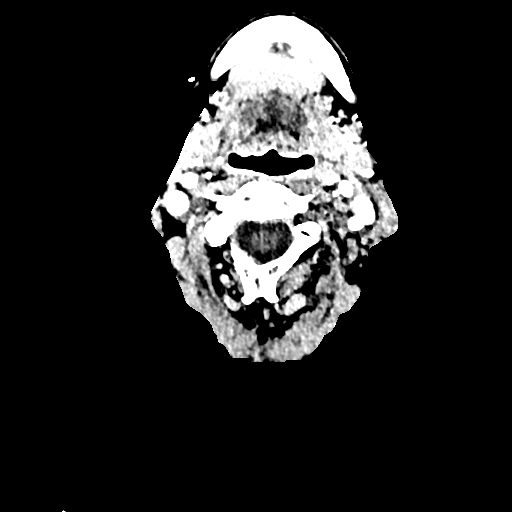
[im 393/736  bone]
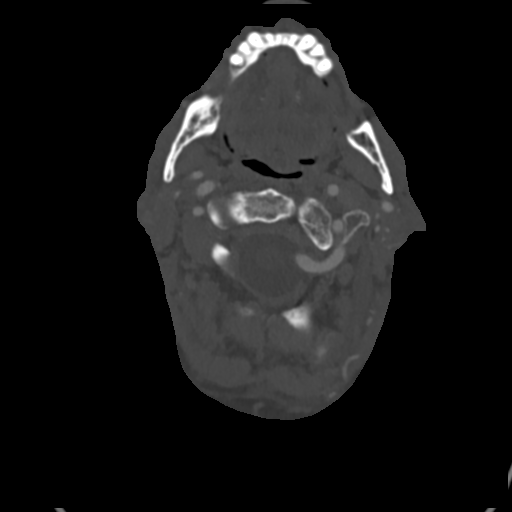
[im 442/736  brain]
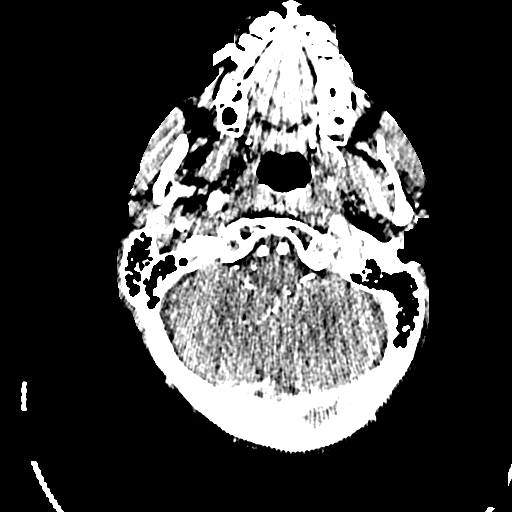
[im 491/736  bone]
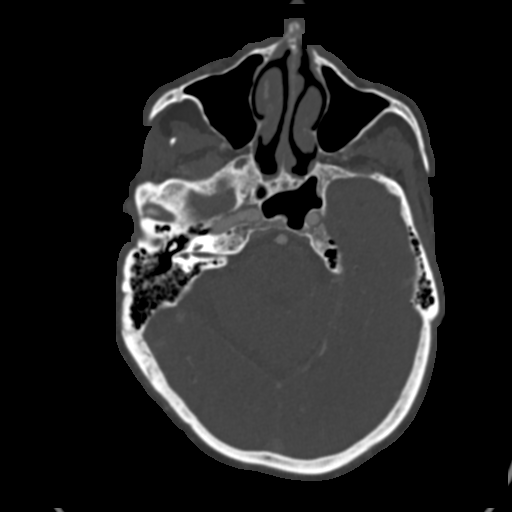
[im 540/736  brain]
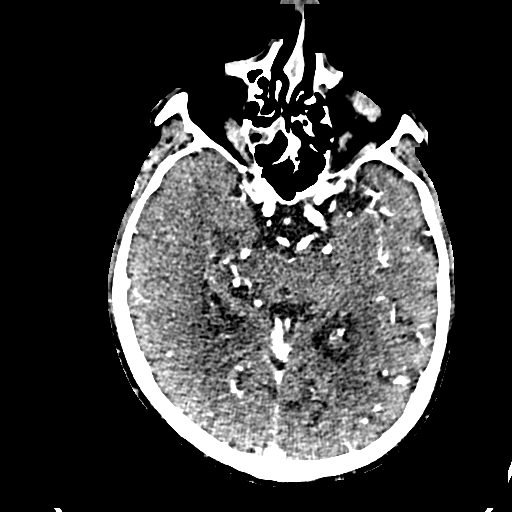
[im 589/736  bone]
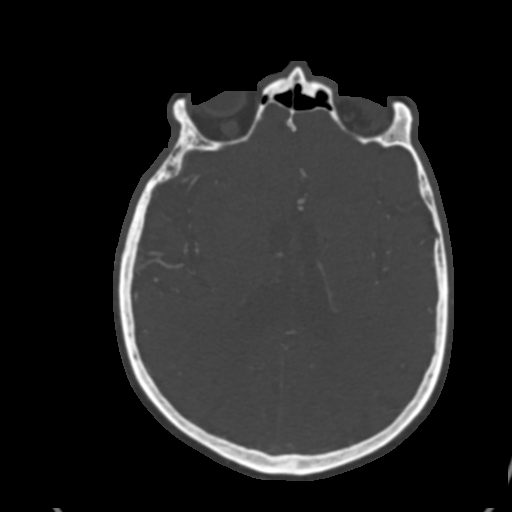
[im 638/736  brain]
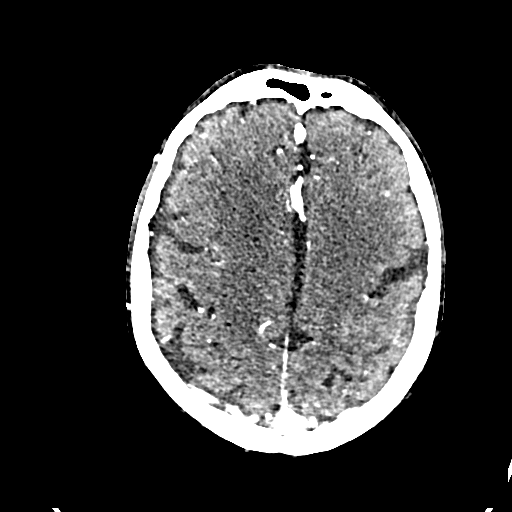
[im 687/736  bone]
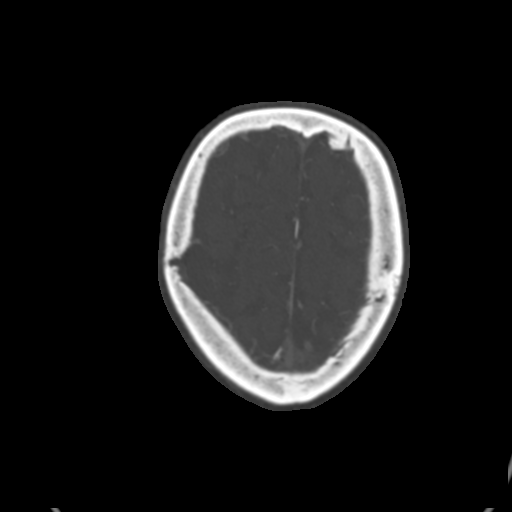

[14 of 47 positions shown; findings below may reference images not displayed]

FINDINGS: CTA NECK FINDINGS

Aortic arch: Standard branching. Imaged portion shows no evidence of
aneurysm or dissection. There is mild calcification of the aortic
arch and proximal great vessels. No significant stenosis of the
major arch vessel origins.

Right carotid system: There is a linear filling defect in the distal
common carotid artery extending into the internal carotid artery
consistent with a dissection flap. There is less than 50% narrowing
of the true lumen. There is mild soft and calcified atherosclerotic
plaque of the proximal right internal carotid artery without
hemodynamically significant stenosis.

Left carotid system: There is mild soft and calcified
atherosclerotic plaque at the left carotid bulb without
hemodynamically significant stenosis or occlusion. There is no
dissection or aneurysm.

Vertebral arteries: There is focal soft and calcified
atherosclerotic plaque at the origin of the right vertebral artery
resulting in approximately 50% narrowing. The remainder of the right
vertebral artery is widely patent. There is calcified
atherosclerotic plaque in the proximal left internal carotid artery
without hemodynamically significant stenosis or occlusion. The
remainder of the left vertebral artery is widely patent. There is no
evidence of dissection.

Skeleton: There is multilevel degenerative change of the cervical
spine, most advanced at C5-C6. There is no acute osseous abnormality
or aggressive osseous lesion.

Other neck: The soft tissues are unremarkable. Multiple dental
caries are noted.

Upper chest: There are multiple nodular opacities scattered
throughout both lung apices with individual nodules measuring up to
7 mm.

Review of the MIP images confirms the above findings

CTA HEAD FINDINGS

Anterior circulation: There is calcification of the bilateral
cavernous ICAs without hemodynamically significant stenosis or
occlusion. The bilateral MCAs and ACAs are patent. There is no
aneurysm.

Posterior circulation: The V4 segments of the vertebral arteries are
patent. The basilar artery is patent. The bilateral PCAs are patent.

Venous sinuses: Suboptimally assessed due to contrast timing.

Anatomic variants: None.

Review of the MIP images confirms the above findings
IMPRESSION: 1. Linear filling defect in the distal right common carotid artery
extending to the proximal internal carotid artery suspicious for
acute dissection, with mixing artifact considered less likely though
not entirely excluded. Short follow up may be considered to assess
for persistence. There is less than 50% narrowing of the true lumen
of the common and internal carotid arteries. This result was called
by telephone at the time of interpretation on [DATE] at [DATE]
to provider [REDACTED] , who verbally acknowledged these results.
2. Scattered atherosclerotic disease throughout the remainder of the
vasculature of the head and neck with no other significant stenosis,
occlusion, dissection, or aneurysm.
3. Scattered nodular opacities throughout both lung apices most
suggestive of multifocal infectious/inflammatory etiology. Recommend
dedicated imaging of the chest or follow-up CT in 1-3 months to
assess for resolution.

## 2021-01-13 IMAGING — CT CT HEAD CODE STROKE
3 series · 15 of 47 positions shown, 18 images · non-contrast
Comparison: Brain MRI [DATE]

CLINICAL DATA: Code stroke.

EXAM:
CT HEAD WITHOUT CONTRAST
TECHNIQUE: Contiguous axial images were obtained from the base of the skull
through the vertex without intravenous contrast.

[Series 3: head 5.0 h30s · axial · 0.43mm/px · z∈[-71,+84]mm · 9 of 37 slices shown, 12 images]
[im 3/37  brain]
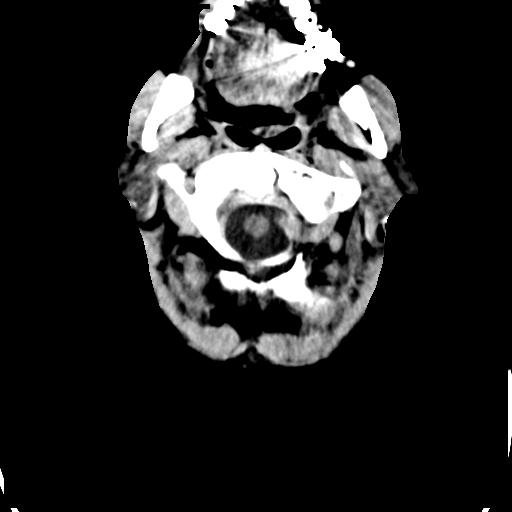
[im 3/37  bone]
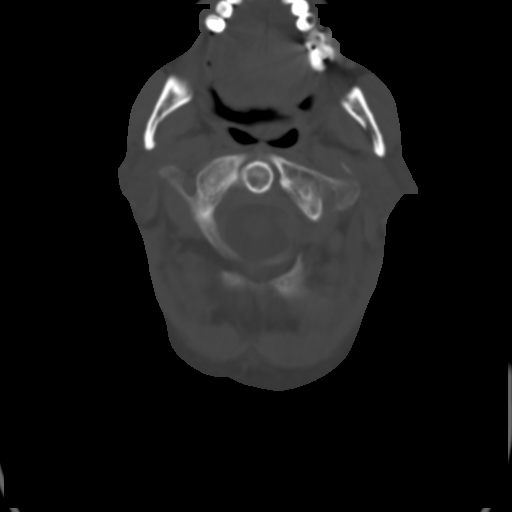
[im 7/37  brain]
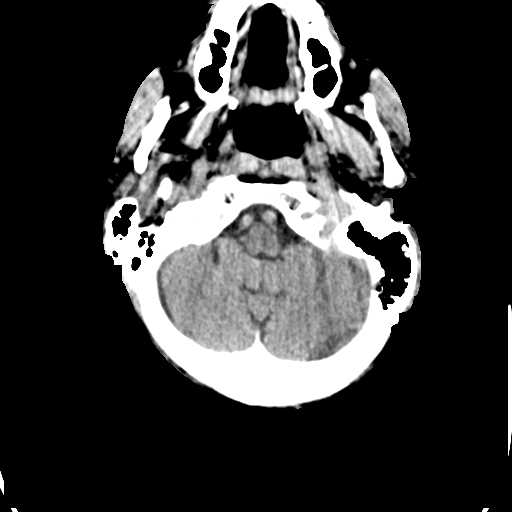
[im 10/37  brain]
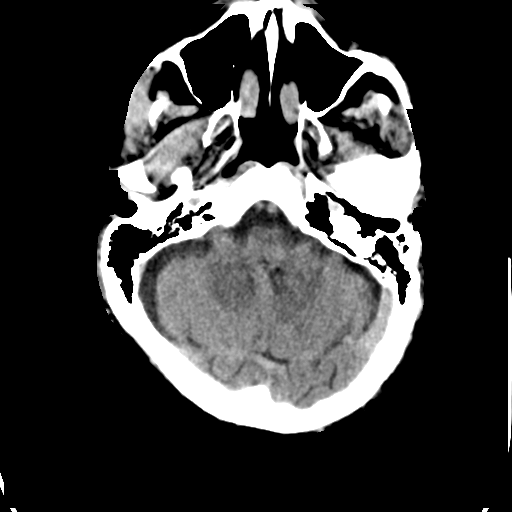
[im 14/37  brain]
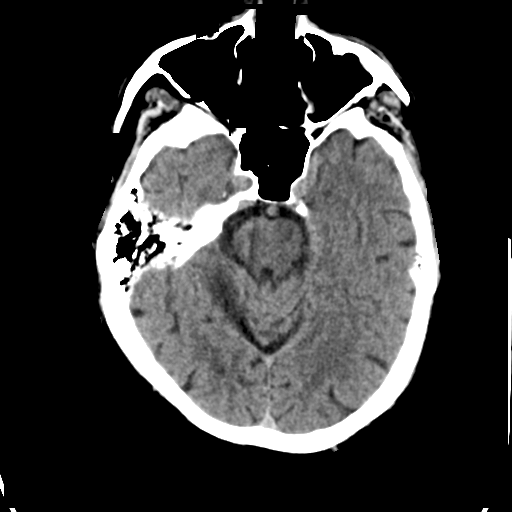
[im 19/37  brain]
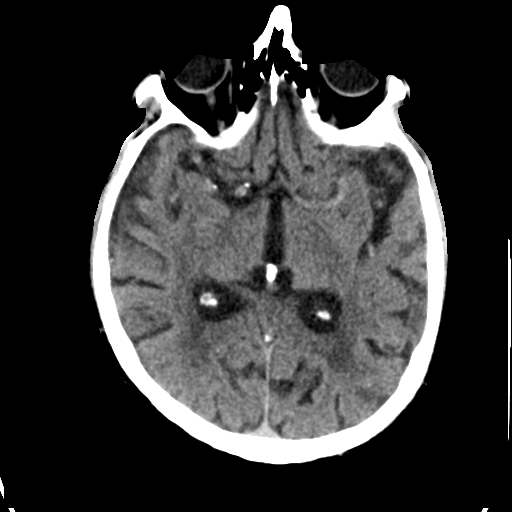
[im 19/37  bone]
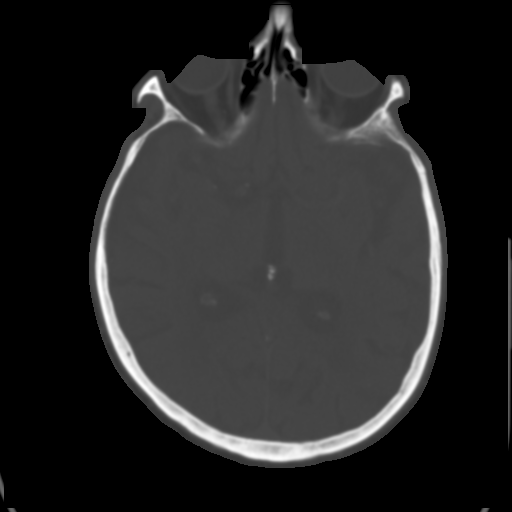
[im 23/37  brain]
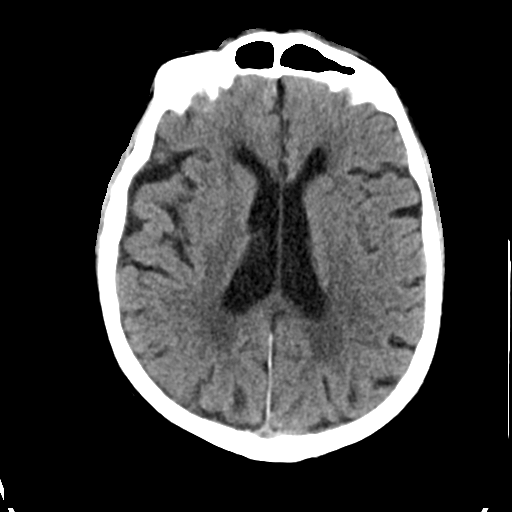
[im 27/37  brain]
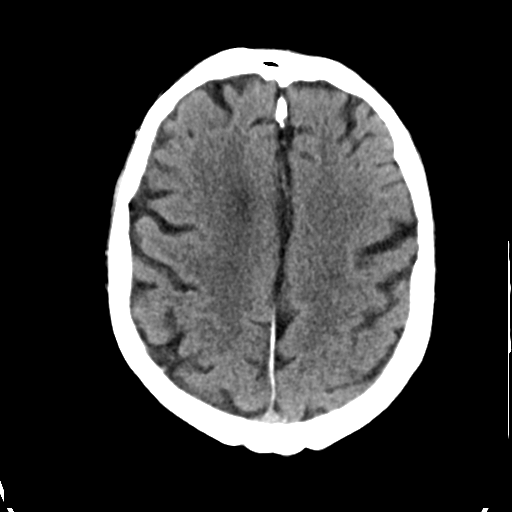
[im 30/37  brain]
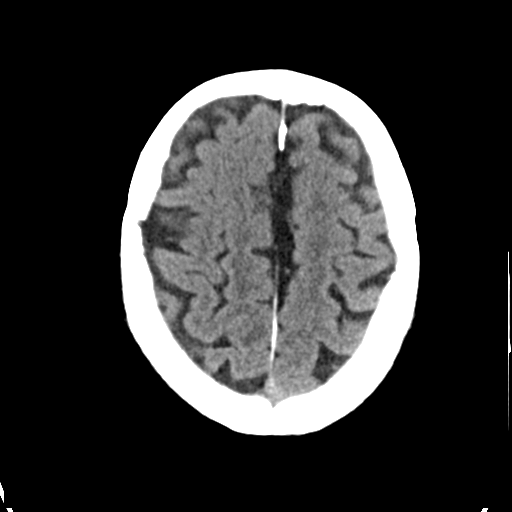
[im 34/37  brain]
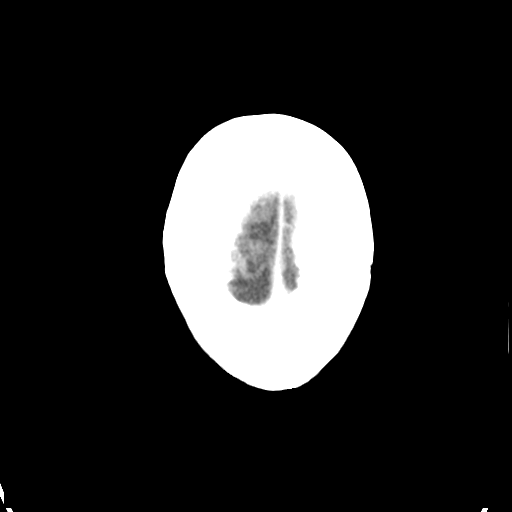
[im 34/37  bone]
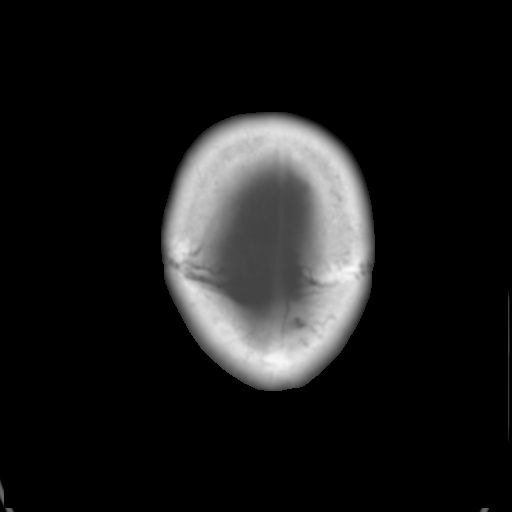

[Series 5: head 3.0 mpr cor · coronal · 0.34mm/px · 3 of 68 slices shown]
[im 23/68  brain]
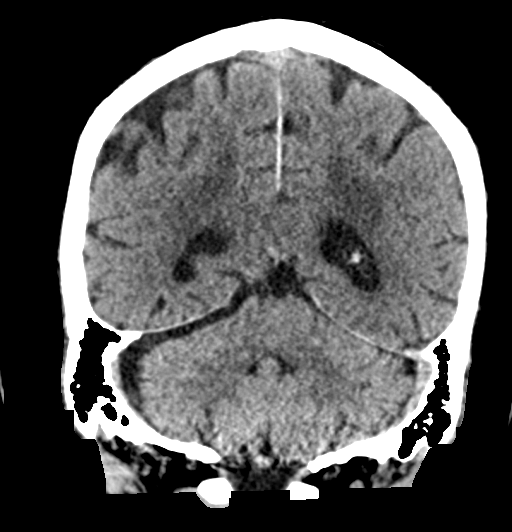
[im 30/68  brain]
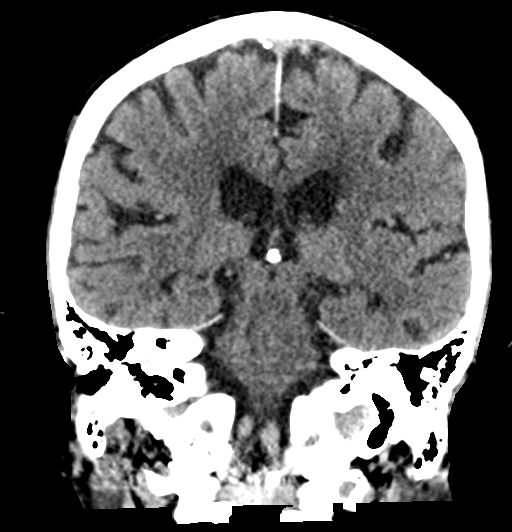
[im 38/68  brain]
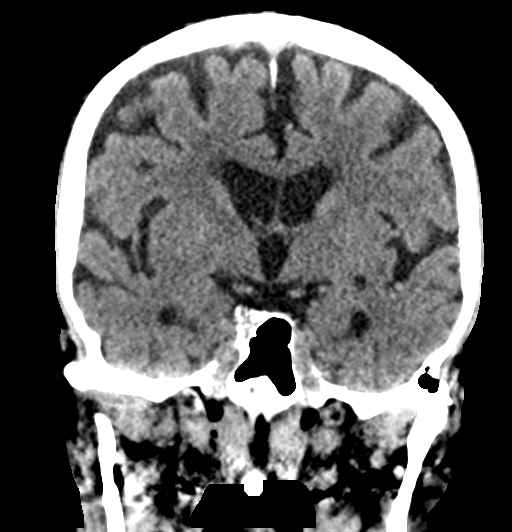

[Series 6: head 3.0 mpr sag · sagittal · 0.36mm/px · 3 of 57 slices shown]
[im 19/57  brain]
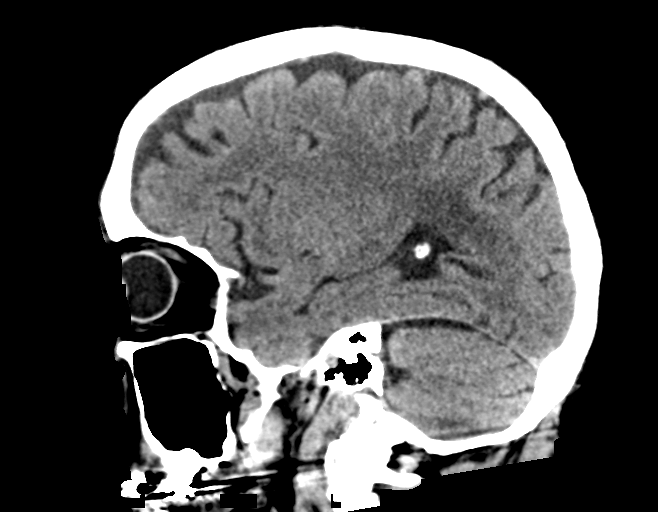
[im 29/57  brain]
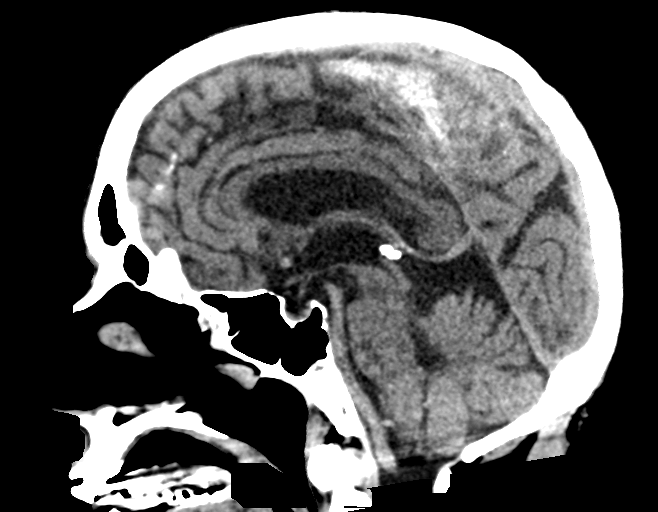
[im 38/57  brain]
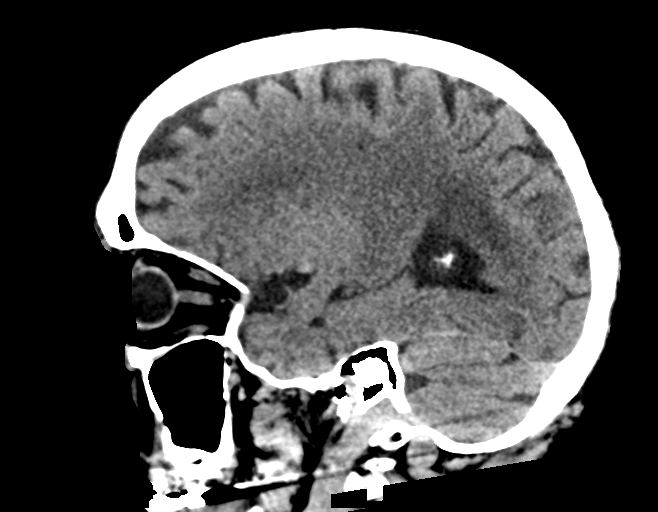

[15 of 47 positions shown; findings below may reference images not displayed]

FINDINGS: Brain: There is no evidence of acute intracranial hemorrhage,
extra-axial fluid collection, or infarct. There are remote lacunar
infarcts in the left basal ganglia and left cerebellar hemisphere;
the cerebellar hemisphere infarct is new since [DATE].

There is mild parenchymal volume loss with prominence of the
ventricular system, unchanged. Foci of hypodensity in the
subcortical and periventricular white matter likely reflects sequela
of chronic white matter microangiopathy, grossly similar to the
prior MRI. There is no mass lesion. There is no midline shift.

Vascular: There is calcification of the bilateral cavernous ICAs.

Skull: Normal. Negative for fracture or focal lesion.

Sinuses/Orbits: There is mild mucosal thickening in the ethmoid air
cells. There is a polypoid lesion originating in the left ethmoid
air cells extending into the left maxillary sinus not present on the
prior MRI. The globes and orbits are unremarkable.

Other: None.

ASPECTS (Alberta Stroke Program Early CT Score)

- Ganglionic level infarction (caudate, lentiform nuclei, internal
capsule, insula, M1-M3 cortex): 7

- Supraganglionic infarction (M4-M6 cortex): 3

Total score (0-10 with 10 being normal): 10
IMPRESSION: 1. No acute intracranial hemorrhage or infarct. This result was
paged via AMION at the time of interpretation on [DATE] at [DATE] to provider [REDACTED], who verbally acknowledged these results.
2. ASPECTS is 10
3. Small remote lacunar infarct in the left cerebellar hemisphere,
new since [DATE].
[DATE]. Polypoid lesion in the left ethmoid air cells extending into the
maxillary sinus as above is new since [DATE]. This most likely
reflects a benign polyp; however, recommend nonemergent correlation
with direct visualization.

## 2021-01-13 MED ORDER — IOHEXOL 350 MG/ML SOLN
100.0000 mL | Freq: Once | INTRAVENOUS | Status: AC | PRN
  Administered 2021-01-13: 100 mL via INTRAVENOUS

## 2021-01-13 MED ORDER — LABETALOL HCL 5 MG/ML IV SOLN
INTRAVENOUS | Status: AC
  Filled 2021-01-13: qty 4

## 2021-01-13 MED ORDER — SODIUM CHLORIDE 0.9 % IV SOLN: INTRAVENOUS | Status: DC

## 2021-01-13 MED ORDER — ACETAMINOPHEN 325 MG PO TABS
650.0000 mg | ORAL_TABLET | ORAL | Status: DC | PRN
  Administered 2021-01-15: 650 mg via ORAL
  Filled 2021-01-13: qty 2

## 2021-01-13 MED ORDER — CLEVIDIPINE BUTYRATE 0.5 MG/ML IV EMUL: 0.0000 mg/h | INTRAVENOUS | Status: DC

## 2021-01-13 MED ORDER — TENECTEPLASE FOR STROKE
0.2500 mg/kg | PACK | Freq: Once | INTRAVENOUS | Status: AC
  Administered 2021-01-13: 15 mg via INTRAVENOUS

## 2021-01-13 MED ORDER — PANTOPRAZOLE SODIUM 40 MG IV SOLR: 40.0000 mg | Freq: Every day | INTRAVENOUS | Status: DC

## 2021-01-13 MED ORDER — CLEVIDIPINE BUTYRATE 0.5 MG/ML IV EMUL
0.0000 mg/h | INTRAVENOUS | Status: DC
Start: 2021-01-13 — End: 2021-01-13
  Administered 2021-01-13: 2 mg/h via INTRAVENOUS
  Filled 2021-01-13: qty 50

## 2021-01-13 MED ORDER — SENNOSIDES-DOCUSATE SODIUM 8.6-50 MG PO TABS: 1.0000 | ORAL_TABLET | Freq: Every evening | ORAL | Status: DC | PRN

## 2021-01-13 MED ORDER — ACETAMINOPHEN 160 MG/5ML PO SOLN: 650.0000 mg | ORAL | Status: DC | PRN

## 2021-01-13 MED ORDER — STROKE: EARLY STAGES OF RECOVERY BOOK
Freq: Once | Status: AC
  Filled 2021-01-13 (×2): qty 1

## 2021-01-13 MED ORDER — LABETALOL HCL 5 MG/ML IV SOLN
10.0000 mg | Freq: Once | INTRAVENOUS | Status: AC
  Administered 2021-01-13: 10 mg via INTRAVENOUS

## 2021-01-13 MED ORDER — ACETAMINOPHEN 650 MG RE SUPP: 650.0000 mg | RECTAL | Status: DC | PRN

## 2021-01-13 MED ORDER — SODIUM CHLORIDE 0.9% FLUSH
3.0000 mL | Freq: Once | INTRAVENOUS | Status: AC
  Administered 2021-01-13: 3 mL via INTRAVENOUS

## 2021-01-13 NOTE — Progress Notes (Signed)
PHARMACIST CODE STROKE RESPONSE  Notified to mix TNK at 1426 by Dr. Selina Cooley Delivered TNK to RN at 1427  TNK dose = 15 mg IV over 5 seconds  Issues/delays encountered (if applicable): Delay due to BP being on higher end, was not above goal, however instructed to administer Labetalol 10mg  prior to administering TNKase.   , PharmD, [facility] Emergency Medicine Clinical Pharmacist ED RPh Phone: [phone] Main RX: [phone]

## 2021-01-13 NOTE — Code Documentation (Signed)
Stroke Response Nurse Documentation Code Documentation  Samuel Mata is a 87 y.o. male arriving to [facility] ED via [facility] on 01/13/21 with past medical hx of HTN. Code stroke was activated by EMS. Patient from home where he was LKW at 1200 then wife noticed he was disoriented and having aphasia . On aspirin 81 mg daily. Stroke team at the bedside on patient arrival. Labs drawn and patient cleared for CT by Dr. Bernette Mayers. Patient to CT with team. NIHSS 5, see documentation for details and code stroke times. Patient with disoriented and Expressive aphasia  on exam. The following imaging was completed:  CT, CTA. Patient received TNK at 1430 and Cleviprex started to manage blood pressure. Care/Plan NIHSS and vital signs Q15 x2, Q30 x6, Q1 x16. Notified 4N charge and patient placement to request ICU bed. Bedside handoff with ED RN Hale Bogus, RN.    [name]  Stroke Response RN

## 2021-01-13 NOTE — ED Notes (Signed)
Pt daughter [name] taking wallet, one hearing aid and glasses home

## 2021-01-13 NOTE — ED Triage Notes (Signed)
Pt BIB EMS from home C/O Aphasia. Per EMS, pt went to the bathroom and wife noted patient started having aphasia, difficulty comprehending. LWN 12:00PM today.

## 2021-01-13 NOTE — H&P (Addendum)
NEUROLOGY H&P   Date of service: January 13, 2021 Patient Name: Samuel Mata MRN:  [id number] DOB:  1930/10/20 Reason for consult: stroke code for aphasia Requesting physician: Samuel Frizzle MD _ _ _   _ __   _ __ _ _  __ __   _ __   __ _  History of Present Illness   87 year old gentleman with a history of hypertension, remote cerebellar stroke who presented by EMS for acute onset of aphasia.  Last known well was 12:00 today.  He came inside and wife noticed that he was not making sense when he spoke.  He was brought in by EMS supreme activated stroke code.  On examination he was able to answer neither question correctly.  He was able to follow commands.  He had no facial palsy.  He had no drift in any extremity.  He did not have any ataxia or sensory deficit.  He had a moderate aphasia with normal articulation and no neglect. NIHSS = 3. CT head showed NAICP and no hemorrhage. I spoke to his wife Samuel Mata by phone, reviewed inclusion/exclusion criteria and benefits/risks of TNK including potentially fatal hemorrhage. She gave informed conset to proceed. 10mg  labetalol was given to decrease his SBP <180 and TNK was administered. 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 Cervical dissection is not a contraindication to thrombolytics and therefore the TNK was not reversed.   ROS   UTA 2/2 aphasia  Past History   Past Medical History:  Diagnosis Date   Enlarged prostate    Hypertension    Renal stones    Past Surgical History:  Procedure Laterality Date   APPENDECTOMY     CHOLECYSTECTOMY     NOSE SURGERY     SPLENECTOMY, TOTAL     Family History  Problem Relation Age of Onset   Lung disease Neg Hx    Social History   Socioeconomic History   Marital status: Married    Spouse name: Not on file   Number of  children: Not on file   Years of education: Not on file   Highest education level: Not on file  Occupational History   Not on file  Tobacco Use   Smoking status: Former    Packs/day: 1.00    Years: 30.00    Pack years: 30.00    Types: Cigarettes    Quit date: 10/07/1984    Years since quitting: 36.2   Smokeless tobacco: Never  Substance and Sexual Activity   Alcohol use: Never   Drug use: Never   Sexual activity: Not on file  Other Topics Concern   Not on file  Social History Narrative   Not on file   Social Determinants of Health   Financial Resource Strain: Not on file  Food Insecurity: Not on file  Transportation Needs: Not on file  Physical Activity: Not on file  Stress: Not on file  Social Connections: Not on file   Allergies  Allergen Reactions   Codeine     Medications    Current Facility-Administered Medications:    labetalol (NORMODYNE) 5 MG/ML injection, , , ,    labetalol (NORMODYNE) injection 10 mg, 10 mg, Intravenous, Once, 10/09/1984 M, MD   sodium chloride flush (NS) 0.9 % injection 3 mL, 3 mL, Intravenous, Once, Samuel Neighbors,  MD   tenecteplase (TNKASE) injection for Stroke 15 mg, 0.25 mg/kg, Intravenous, Once, Samuel Fuel, MD  Current Outpatient Medications:    acetaminophen (TYLENOL) 325 MG tablet, Take 325-650 mg by mouth every 6 (six) hours as needed for moderate pain. , Disp: , Rfl:    Alum Hydroxide-Mag Carbonate (ACID GONE ANTACID PO), Take 1-2 tablets by mouth daily as needed (indigestion)., Disp: , Rfl:    aspirin 81 MG EC tablet, Take by mouth., Disp: , Rfl:    calcium carbonate (OS-CAL) 1250 (500 Ca) MG chewable tablet, Chew by mouth., Disp: , Rfl:    Cholecalciferol 10 MCG (400 UNIT) CAPS, Take 400 Units by mouth daily., Disp: , Rfl:    Respiratory Therapy Supplies (FLUTTER) DEVI, 1 puff by Does not apply route in the morning and at bedtime., Disp: 1 each, Rfl: 0   sodium chloride (OCEAN) 0.65 % nasal spray, Place 1 spray  into the nose daily as needed for congestion. , Disp: , Rfl:    sodium chloride HYPERTONIC 3 % nebulizer solution, Take by nebulization as needed for other., Disp: 750 mL, Rfl: 12     Vitals   Vitals:   01/13/21 1400  Weight: 61.5 kg     Body mass index is 21.22 kg/m.  Physical Exam   Physical Exam Gen: A&O x4, NAD HEENT: Atraumatic, normocephalic;mucous membranes moist; oropharynx clear, tongue without atrophy or fasciculations. Neck: Supple, trachea midline. Resp: CTAB, no w/r/r CV: RRR, no m/g/r; nml S1 and S2. 2+ symmetric peripheral pulses. Abd: soft/NT/ND; nabs x 4 quad Extrem: Nml bulk; no cyanosis, clubbing, or edema.  Neuro: *MS: A&O x4. Follows multi-step commands.  *Speech: fluid, nondysarthric, impaired naming and repetition. Expressive>receptive aphasia *CN:    I: Deferred   II,III: PERRLA, VFF by confrontation, optic discs not visualized 2/2 pupillary constriction   III,IV,VI: EOMI w/o nystagmus, no ptosis   V: Sensation intact from V1 to V3 to LT   VII: Eyelid closure was full.  Smile symmetric.   VIII: Hearing intact to voice   IX,X: Voice normal, palate elevates symmetrically    XI: SCM/trap 5/5 bilat   XII: Tongue protrudes midline, no atrophy or fasciculations   *Motor:   Normal bulk.  No tremor, rigidity or bradykinesia. No pronator drift.    Strength: Dlt Bic Tri WrE WrF FgS Gr HF KnF KnE PlF DoF    Left 5 5 5 5 5 5 5 5 5 5 5 5     Right 5 5 5 5 5 5 5 5 5 5 5 5     *Sensory: Intact to light touch, pinprick, temperature vibration throughout. Symmetric. Propioception intact bilat.  No double-simultaneous extinction.  *Coordination:  Finger-to-nose, heel-to-shin, rapid alternating motions were intact. *Reflexes:  2+ and symmetric throughout without clonus; toes down-going bilat *Gait: deferred  NIHSS  1a Level of Conscious.: 0 1b LOC Questions: 2 1c LOC Commands: 0 2 Best Gaze: 0 3 Visual: 0 4 Facial Palsy: 0 5a Motor Arm - left: 0 5b Motor  Arm - Right: 0 6a Motor Leg - Left: 0 6b Motor Leg - Right: 0 7 Limb Ataxia: 0 8 Sensory: 0 9 Best Language: 1 10 Dysarthria: 0 11 Extinct. and Inatten.: 0  TOTAL: 3   Premorbid mRS = 1   Labs   CBC:  Recent Labs  Lab 01/13/21 1415 01/13/21 1422  WBC 7.3  --   NEUTROABS 3.8  --   HGB 15.3 15.0  HCT 45.0 44.0  MCV 93.2  --  PLT 379  --     Basic Metabolic Panel:  Lab Results  Component Value Date   NA 135 01/13/2021   K 3.9 01/13/2021   CO2 29 08/22/2020   GLUCOSE 107 (H) 01/13/2021   BUN 14 01/13/2021   CREATININE 0.70 01/13/2021   CALCIUM 9.3 08/22/2020   GFRNONAA 59 (L) 02/20/2020   GFRAA >60 09/14/2019   Lipid Panel: No results found for: LDLCALC HgbA1c: No results found for: HGBA1C Urine Drug Screen: No results found for: LABOPIA, COCAINSCRNUR, LABBENZ, AMPHETMU, THCU, LABBARB  Alcohol Level No results found for: ETH   Impression   87 year old gentleman with a history of hypertension, remote cerebellar stroke who presented by EMS for acute onset of aphasia, now s/p TNK. No indication for intervention given no LVO on CTA. 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 This is not a contraindication to thrombolysis. He will be admitted to the ICU for close monitoring and stroke workup.  Recommendations   - Admit to [facility] ICU under Dr. Selina Cooley - Routine post Thrombolytic monitoring including neuro checks and blood pressure control during/after treatment Monitor blood pressure Check blood pressure and neuro assessment every 15 min for 2 h, then every 30 min for 6 h, and finally every hour for 16 h. - Manage Blood Pressure per post Thrombolytic protocol. Avoid oral antihypertensives - CT brain 24 hours post Thrombolytic - NPO until swallowing screen performed and passed - No antiplatelet agents or anticoagulants (including heparin for DVT prophylaxis) in first 24 hours - No Foley catheter, nasogastric tube, arterial catheter or central venous catheter for 24  hr, unless absolutely necessary - Telemetry - MRI brain wwo - TTE w/ bubble - Check A1c and LDL + add statin per guidelines - q4 hr neuro checks - STAT head CT for any change in neuro exam - PT/OT - Stroke education - Amb referral to neurology upon discharge   Stroke team will assume care tomorrow.  ______________________________________________________________________   Thank you for the opportunity to take part in the care of this patient. If you have any further questions, please contact the neurology consultation attending.  Signed,  Samuel Neighbors, MD [facility] [phone]  If 7pm- 7am, please page neurology on call as listed in AMION.  This patient is critically ill and at significant risk of neurological worsening, death and care requires constant monitoring of vital signs, hemodynamics,respiratory and cardiac monitoring, neurological assessment, discussion with family, other specialists and medical decision making of high complexity. I spent 90 minutes of neurocritical care time  in the care of  this patient. This was time spent independent of any time provided by nurse practitioner or PA.  Samuel Neighbors, MD [facility] [phone]  If 7pm- 7am, please page neurology on call as listed in AMION.

## 2021-01-13 NOTE — ED Provider Notes (Signed)
[place] EMERGENCY DEPARTMENT Provider Note  CSN: [id number] Arrival date & time: 01/13/21 1413    History Chief Complaint  Patient presents with   Code Stroke    Kemon Devincenzi is a 87 y.o. male brought by EMS from home as a Code Stroke. Reportedly seen at his normal self around noon by his wife and then shortly afterwards he seemed confused to her, having trouble speaking. The daughter came over and called EMS. He denies any complaints. No reported facial droop, arm or leg weakness.    Past Medical History:  Diagnosis Date   Enlarged prostate    Hypertension    Renal stones     Past Surgical History:  Procedure Laterality Date   APPENDECTOMY     CHOLECYSTECTOMY     NOSE SURGERY     SPLENECTOMY, TOTAL      Family History  Problem Relation Age of Onset   Lung disease Neg Hx     Social History   Tobacco Use   Smoking status: Former    Packs/day: 1.00    Years: 30.00    Pack years: 30.00    Types: Cigarettes    Quit date: 10/07/1984    Years since quitting: 36.2   Smokeless tobacco: Never  Substance Use Topics   Alcohol use: Never   Drug use: Never     Home Medications Prior to Admission medications   Medication Sig Start Date End Date Taking? Authorizing Provider  acetaminophen (TYLENOL) 325 MG tablet Take 325-650 mg by mouth every 6 (six) hours as needed for moderate pain.     [provider]  Alum Hydroxide-Mag Carbonate (ACID GONE ANTACID PO) Take 1-2 tablets by mouth daily as needed (indigestion).    [provider]  aspirin 81 MG EC tablet Take by mouth.    [provider]  calcium carbonate (OS-CAL) 1250 (500 Ca) MG chewable tablet Chew by mouth.    [provider]  Cholecalciferol 10 MCG (400 UNIT) CAPS Take 400 Units by mouth daily.    [provider]  Respiratory Therapy Supplies (FLUTTER) DEVI 1 puff by Does not apply route in the morning and at bedtime. 10/08/19   Charlott Holler, MD  sodium  chloride (OCEAN) 0.65 % nasal spray Place 1 spray into the nose daily as needed for congestion.     [provider]  sodium chloride HYPERTONIC 3 % nebulizer solution Take by nebulization as needed for other. 10/08/19   Charlott Holler, MD     Allergies    Codeine   Review of Systems   Review of Systems A comprehensive review of systems was completed and negative except as noted in HPI.    Physical Exam BP 133/73   Pulse 77   Temp 98 F (36.7 C) (Oral)   Resp (!) 25   Wt 61.5 kg   SpO2 96%   BMI 21.22 kg/m   Physical Exam Vitals and nursing note reviewed.  Constitutional:      Appearance: Normal appearance.  HENT:     Head: Normocephalic and atraumatic.     Nose: Nose normal.     Mouth/Throat:     Mouth: Mucous membranes are moist.  Eyes:     Extraocular Movements: Extraocular movements intact.     Conjunctiva/sclera: Conjunctivae normal.  Cardiovascular:     Rate and Rhythm: Normal rate.  Pulmonary:     Effort: Pulmonary effort is normal.     Breath sounds: Normal breath sounds.  Abdominal:     General: Abdomen is flat.     Palpations: Abdomen is soft.     Tenderness: There is no abdominal tenderness.  Musculoskeletal:        General: No swelling. Normal range of motion.     Cervical back: Neck supple.  Skin:    General: Skin is warm and dry.  Neurological:     Mental Status: He is alert.     Comments: No obvious focal deficit on screening exam on arrival. Neurology team at bedside on arrival, see their note for NIHSS.   Psychiatric:        Mood and Affect: Mood normal.     ED Results / Procedures / Treatments   Labs (all labs ordered are listed, but only abnormal results are displayed) Labs Reviewed  DIFFERENTIAL - Abnormal; Notable for the following components:      Result Value   Monocytes Absolute 1.2 (*)    All other components within normal limits  COMPREHENSIVE METABOLIC PANEL - Abnormal; Notable for the following components:    Sodium 134 (*)    Glucose, Bld 110 (*)    Albumin 3.4 (*)    All other components within normal limits  I-STAT CHEM 8, ED - Abnormal; Notable for the following components:   Chloride 97 (*)    Glucose, Bld 107 (*)    Calcium, Ion 1.13 (*)    All other components within normal limits  CBG MONITORING, ED - Abnormal; Notable for the following components:   Glucose-Capillary 115 (*)    All other components within normal limits  PROTIME-INR  APTT  CBC    EKG None  Radiology CT HEAD CODE STROKE WO CONTRAST  Result Date: 01/13/2021 CLINICAL DATA:  Code stroke. EXAM: CT HEAD WITHOUT CONTRAST TECHNIQUE: Contiguous axial images were obtained from the base of the skull through the vertex without intravenous contrast. COMPARISON:  Brain MRI 03/06/2020 FINDINGS: Brain: There is no evidence of acute intracranial hemorrhage, extra-axial fluid collection, or infarct. There are remote lacunar infarcts in the left basal ganglia and left cerebellar hemisphere; the cerebellar hemisphere infarct is new since 03/06/2020. There is mild parenchymal volume loss with prominence of the ventricular system, unchanged. Foci of hypodensity in the subcortical and periventricular white matter likely reflects sequela of chronic white matter microangiopathy, grossly similar to the prior MRI. There is no mass lesion. There is no midline shift. Vascular: There is calcification of the bilateral cavernous ICAs. Skull: Normal. Negative for fracture or focal lesion. Sinuses/Orbits: There is mild mucosal thickening in the ethmoid air cells. There is a polypoid lesion originating in the left ethmoid air cells extending into the left maxillary sinus not present on the prior MRI. The globes and orbits are unremarkable. Other: None. ASPECTS [facility] Stroke Program Early CT Score) - Ganglionic level infarction (caudate, lentiform nuclei, internal capsule, insula, M1-M3 cortex): 7 - Supraganglionic infarction (M4-M6 cortex): 3 Total score  (0-10 with 10 being normal): 10 IMPRESSION: 1. No acute intracranial hemorrhage or infarct. This result was paged via AMION at the time of interpretation on 01/13/2021 at 2:33 pm to provider Dr Selina Cooley, who verbally acknowledged these results. 2. ASPECTS is 10 3. Small remote lacunar infarct in the left cerebellar hemisphere, new since 03/07/2019. 4. Polypoid lesion in the left ethmoid air cells extending into the maxillary sinus as above is new since 03/06/2020. This most likely reflects a benign polyp; however, recommend nonemergent correlation with direct visualization. Electronically Signed   By: Theron Arista  Noone M.D.   On: 01/13/2021 14:35   CT ANGIO HEAD NECK W WO CM W PERF (CODE STROKE)  Result Date: 01/13/2021 CLINICAL DATA:  Neuro deficit EXAM: CT ANGIOGRAPHY HEAD AND NECK TECHNIQUE: Multidetector CT imaging of the head and neck was performed using the standard protocol during bolus administration of intravenous contrast. Multiplanar CT image reconstructions and MIPs were obtained to evaluate the vascular anatomy. Carotid stenosis measurements (when applicable) are obtained utilizing NASCET criteria, using the distal internal carotid diameter as the denominator. CONTRAST:  100mL OMNIPAQUE IOHEXOL 350 MG/ML SOLN COMPARISON:  Same-day noncontrast CT head, brain MRI 03/06/2020 FINDINGS: CTA NECK FINDINGS Aortic arch: Standard branching. Imaged portion shows no evidence of aneurysm or dissection. There is mild calcification of the aortic arch and proximal great vessels. No significant stenosis of the major arch vessel origins. Right carotid system: There is a linear filling defect in the distal common carotid artery extending into the internal carotid artery consistent with a dissection flap. There is less than 50% narrowing of the true lumen. There is mild soft and calcified atherosclerotic plaque of the proximal right internal carotid artery without hemodynamically significant stenosis. Left carotid system:  There is mild soft and calcified atherosclerotic plaque at the left carotid bulb without hemodynamically significant stenosis or occlusion. There is no dissection or aneurysm. Vertebral arteries: There is focal soft and calcified atherosclerotic plaque at the origin of the right vertebral artery resulting in approximately 50% narrowing. The remainder of the right vertebral artery is widely patent. There is calcified atherosclerotic plaque in the proximal left internal carotid artery without hemodynamically significant stenosis or occlusion. The remainder of the left vertebral artery is widely patent. There is no evidence of dissection. Skeleton: There is multilevel degenerative change of the cervical spine, most advanced at C5-C6. There is no acute osseous abnormality or aggressive osseous lesion. Other neck: The soft tissues are unremarkable. Multiple dental caries are noted. Upper chest: There are multiple nodular opacities scattered throughout both lung apices with individual nodules measuring up to 7 mm. Review of the MIP images confirms the above findings CTA HEAD FINDINGS Anterior circulation: There is calcification of the bilateral cavernous ICAs without hemodynamically significant stenosis or occlusion. The bilateral MCAs and ACAs are patent. There is no aneurysm. Posterior circulation: The V4 segments of the vertebral arteries are patent. The basilar artery is patent. The bilateral PCAs are patent. Venous sinuses: Suboptimally assessed due to contrast timing. Anatomic variants: None. Review of the MIP images confirms the above findings IMPRESSION: 1. Linear filling defect in the distal right common carotid artery extending to the proximal internal carotid artery suspicious for acute dissection, with mixing artifact considered less likely though not entirely excluded. Short follow up may be considered to assess for persistence. There is less than 50% narrowing of the true lumen of the common and internal  carotid arteries. This result was called by telephone at the time of interpretation on 01/13/2021 at 3:11 pm to [facility] , who verbally acknowledged these results. 2. Scattered atherosclerotic disease throughout the remainder of the vasculature of the head and neck with no other significant stenosis, occlusion, dissection, or aneurysm. 3. Scattered nodular opacities throughout both lung apices most suggestive of multifocal infectious/inflammatory etiology. Recommend dedicated imaging of the chest or follow-up CT in 1-3 months to assess for resolution. Electronically Signed   By: Lesia HausenPeter  Noone M.D.   On: 01/13/2021 15:12    Procedures Procedures  Medications Ordered in the ED Medications   stroke:  mapping our early stages of recovery book (has no administration in time range)  0.9 %  sodium chloride infusion (has no administration in time range)  acetaminophen (TYLENOL) tablet 650 mg (has no administration in time range)    Or  acetaminophen (TYLENOL) 160 MG/5ML solution 650 mg (has no administration in time range)    Or  acetaminophen (TYLENOL) suppository 650 mg (has no administration in time range)  senna-docusate (Senokot-S) tablet 1 tablet (has no administration in time range)  pantoprazole (PROTONIX) injection 40 mg (has no administration in time range)  clevidipine (CLEVIPREX) infusion 0.5 mg/mL (has no administration in time range)  sodium chloride flush (NS) 0.9 % injection 3 mL (3 mLs Intravenous Given 01/13/21 1456)  tenecteplase (TNKASE) injection for Stroke 15 mg (15 mg Intravenous Given 01/13/21 1430)  labetalol (NORMODYNE) injection 10 mg ( Intravenous Not Given 01/13/21 1454)  iohexol (OMNIPAQUE) 350 MG/ML injection 100 mL (100 mLs Intravenous Contrast Given 01/13/21 1450)     MDM Rules/Calculators/A&P MDM  Patient with confusion, new onset this afternoon. Brought in as a Code Stroke. Taken directly to CT.   ED Course  I have reviewed the triage vital signs and  the nursing notes.  Pertinent labs & imaging results that were available during my care of the patient were reviewed by me and considered in my medical decision making (see chart for details).  Clinical Course as of 01/13/21 1536  Tue Jan 13, 2021  1436 CBC is normal. I-stat unremarkable.  [CS]  1512 CMP is unremarkable. Coags normal.  [CS]  1535 Patient admitted by Neurology. CTA concerning for carotid dissection.   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

## 2021-01-14 ENCOUNTER — Inpatient Hospital Stay (HOSPITAL_COMMUNITY): Payer: Medicare HMO

## 2021-01-14 DIAGNOSIS — I6389 Other cerebral infarction: Secondary | ICD-10-CM | POA: Diagnosis not present

## 2021-01-14 LAB — SARS CORONAVIRUS 2 (TAT 6-24 HRS): SARS Coronavirus 2: NEGATIVE

## 2021-01-14 LAB — HEMOGLOBIN A1C
Hgb A1c MFr Bld: 5.5 % (ref 4.8–5.6)
Mean Plasma Glucose: 111 mg/dL

## 2021-01-14 LAB — ECHOCARDIOGRAM COMPLETE
Area-P 1/2: 2.83 cm2
S' Lateral: 3.5 cm
Weight: 2095.25 oz

## 2021-01-14 LAB — LIPID PANEL
Cholesterol: 176 mg/dL (ref 0–200)
HDL: 47 mg/dL (ref >40)
LDL Cholesterol: 121 mg/dL — ABNORMAL HIGH (ref 0–99)
Total CHOL/HDL Ratio: 3.7 RATIO
Triglycerides: 40 mg/dL (ref <150)
VLDL: 8 mg/dL (ref 0–40)

## 2021-01-14 LAB — MRSA NEXT GEN BY PCR, NASAL: MRSA by PCR Next Gen: NOT DETECTED

## 2021-01-14 IMAGING — MR MR HEAD W/O CM
7 of 11 series · 25 of 48 positions shown · non-contrast
Comparison: CT head [DATE].

CLINICAL DATA: Neuro deficit, acute, stroke suspected

EXAM:
MRI HEAD WITHOUT CONTRAST
TECHNIQUE: Multiplanar, multiecho pulse sequences of the brain and surrounding
structures were obtained without intravenous contrast.

[Series 2: DWI · axial · 3.0mm · 0.94mm/px · z∈[-71,+80]mm · 8 of 104 slices shown (1 of 2)]
[im 1/104]
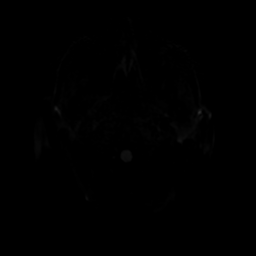
[im 15/104]
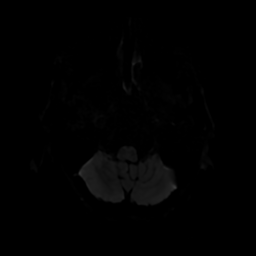
[im 30/104]
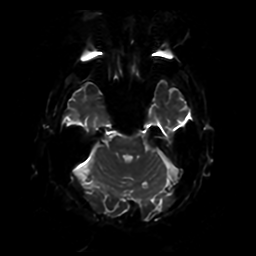
[im 45/104]
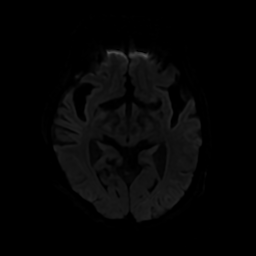
[im 59/104]
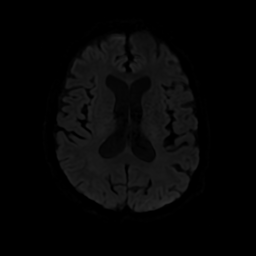
[im 74/104]
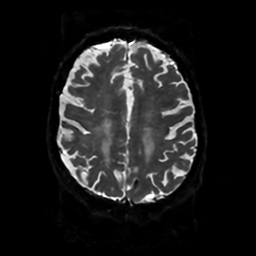
[im 89/104]
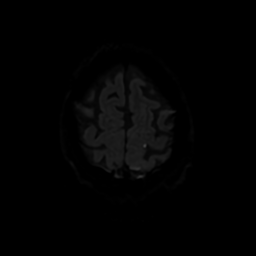
[im 104/104]
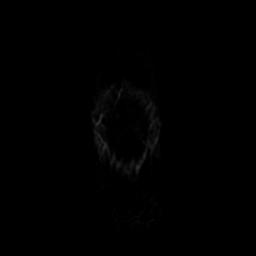

[Series 3: DWI · coronal · 4.0mm · 0.94mm/px · 6 of 72 slices shown (2 of 2)]
[im 1/72]
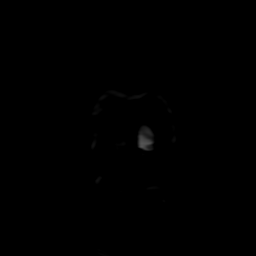
[im 15/72]
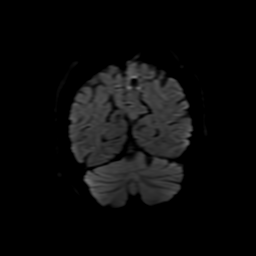
[im 29/72]
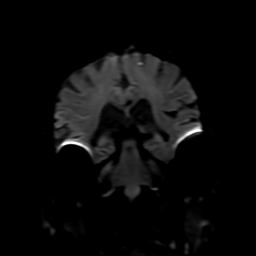
[im 43/72]
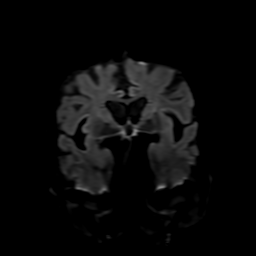
[im 57/72]
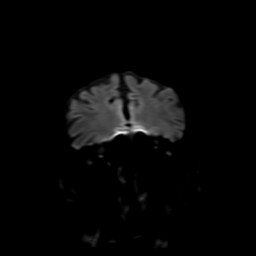
[im 72/72]
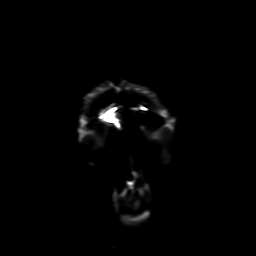

[Series 4: FLAIR · sagittal · 5.0mm · 0.23mm/px · 2 of 26 slices shown (1 of 2)]
[im 1/26]
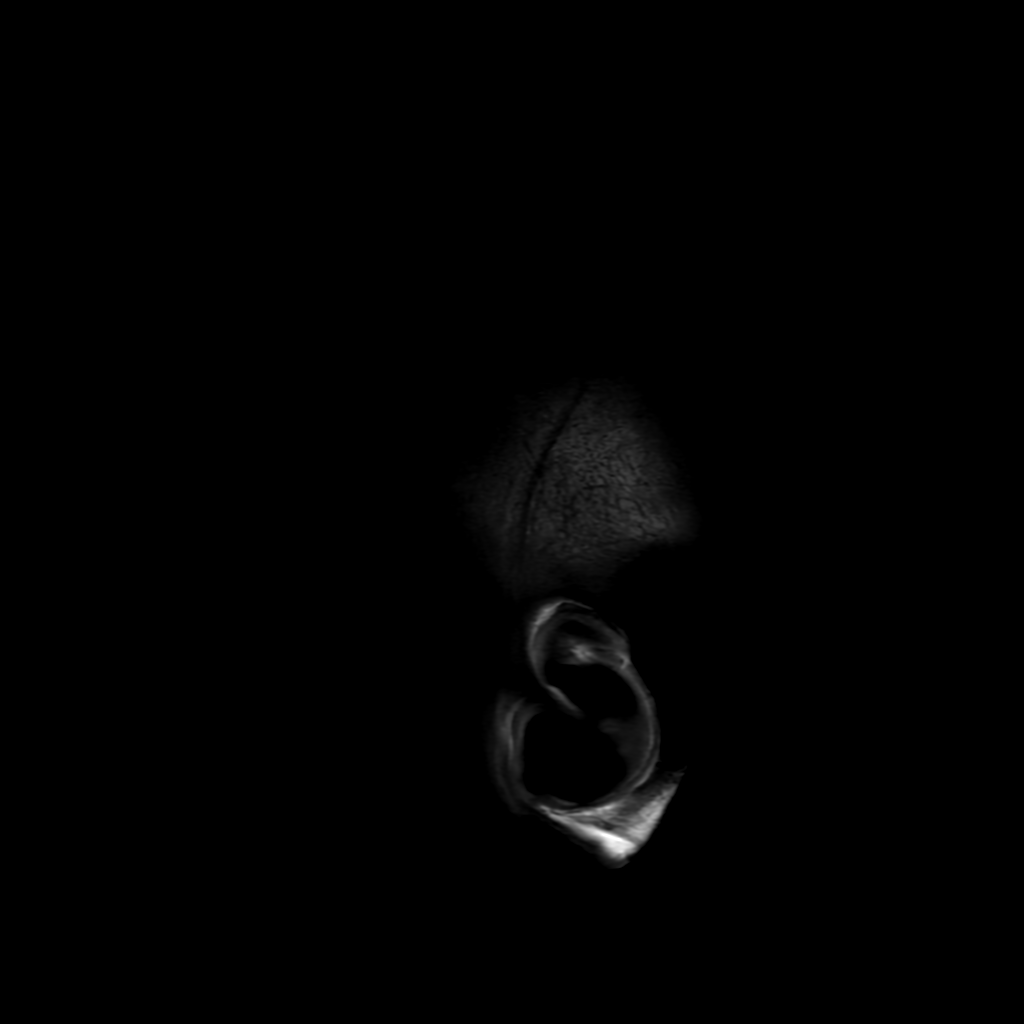
[im 26/26]
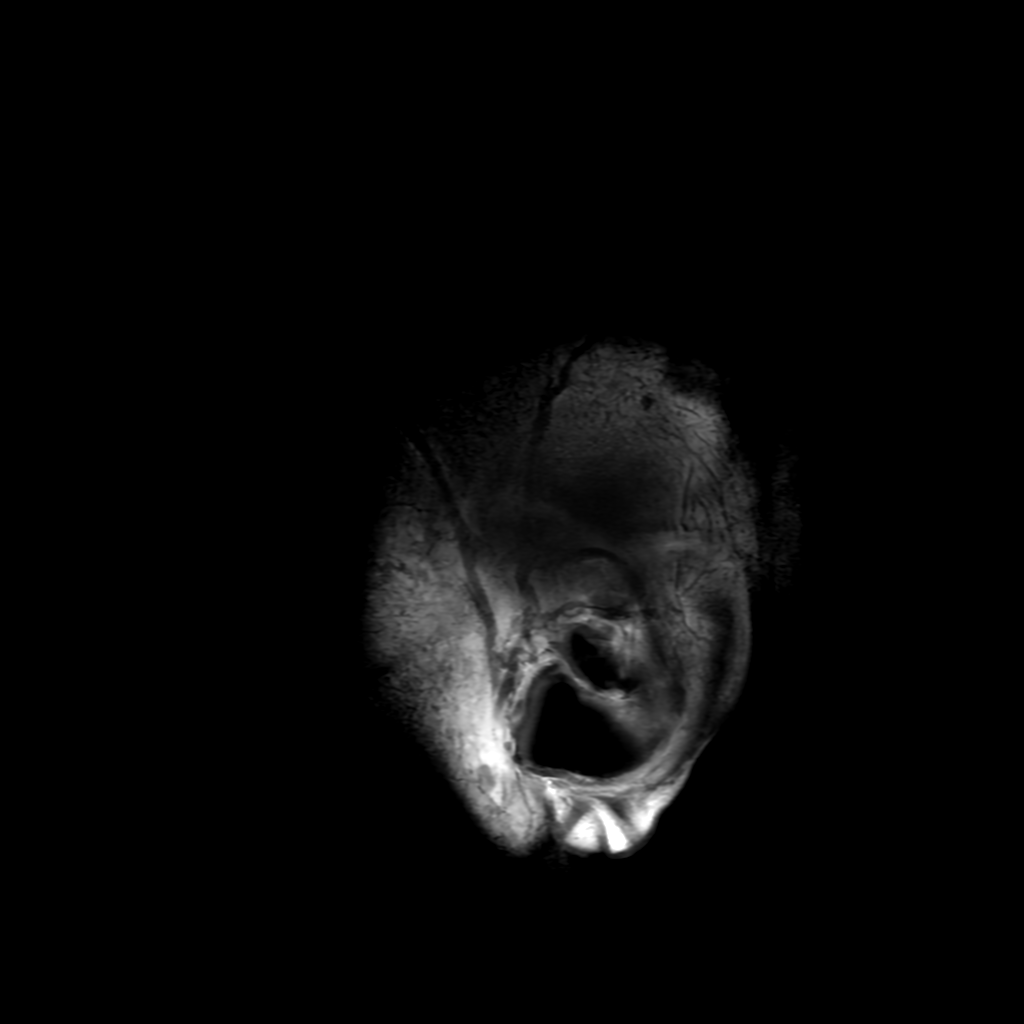

[Series 5: T2 · axial · 5.0mm · 0.23mm/px · 1 of 26 slices shown]
[im 1/26]
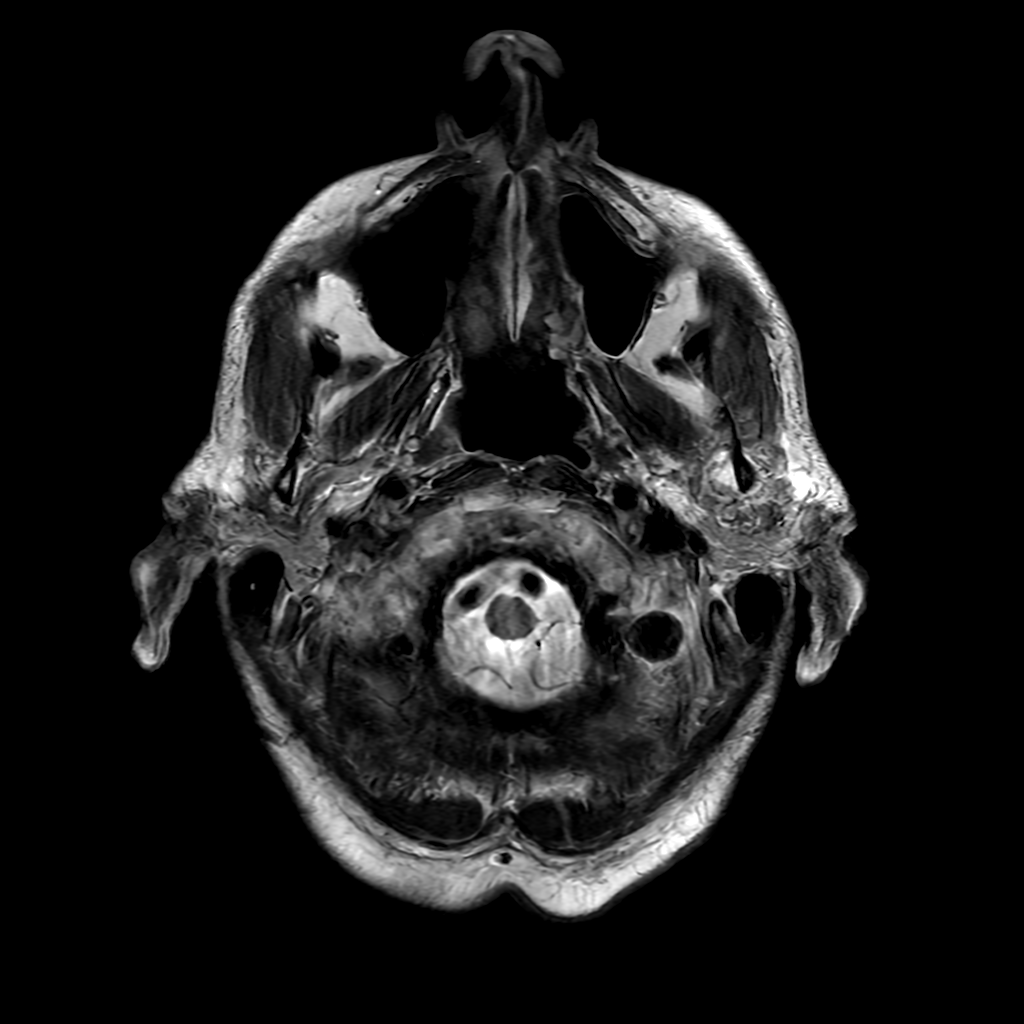

[Series 6: FLAIR · axial · 4.0mm · 0.45mm/px · 1 of 9 slices shown (2 of 2)]
[im 1/9]
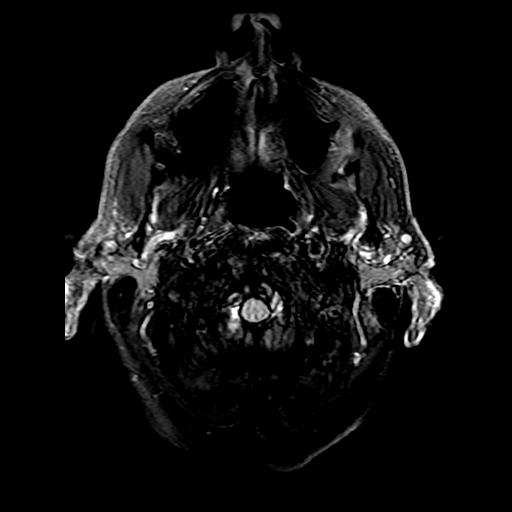

[Series 250: ADC · axial · 3.0mm · 0.94mm/px · z∈[-71,+80]mm · 4 of 52 slices shown (1 of 2)]
[im 1/52]
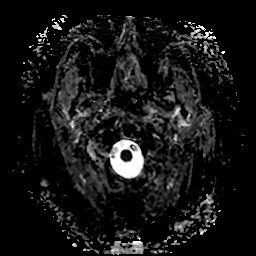
[im 18/52]
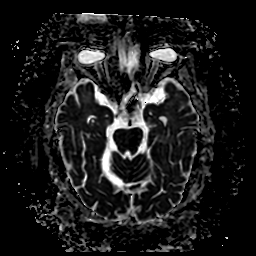
[im 35/52]
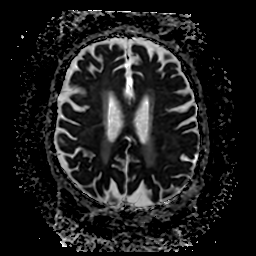
[im 52/52]
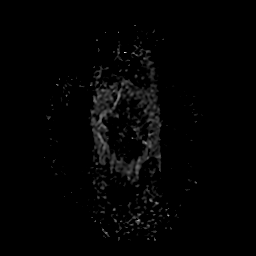

[Series 350: ADC · coronal · 4.0mm · 0.94mm/px · 3 of 34 slices shown (2 of 2)]
[im 1/34]
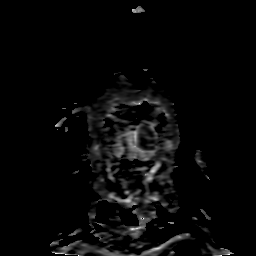
[im 17/34]
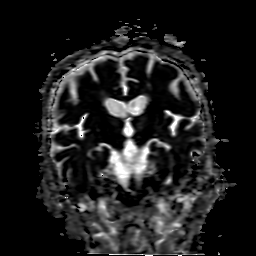
[im 34/34]
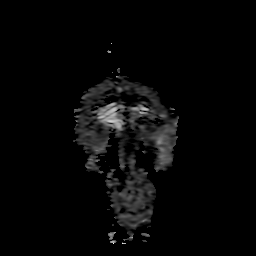

[25 of 48 positions shown; findings below may reference images not displayed]

FINDINGS: Brain: Punctate acute infarct in the high left frontoparietal
perirolandic cortex (series 2, image 45). No significant edema or
mass effect. Mild-to-moderate for age T2 hyperintensities in the
periventricular and pontine white matter, nonspecific but compatible
with chronic microvascular ischemic disease. Mild to moderate for
age atrophy. Chronic infarct in the left cerebellum. No evidence of
acute hemorrhage, hydrocephalus, mass lesion, or extra-axial fluid
collection. Focus of susceptibility artifact with associated round 5
mm T2 hyperintensity in the high posterior left parietal lobe
(series 5, image 19; series 7, image 25), compatible with prior
hemorrhage in possible cavernous malformation. No associated edema.

Vascular: Major arterial flow voids are maintained at the skull
base.

Skull and upper cervical spine: Normal marrow signal.

Sinuses/Orbits: Mild to moderate paranasal sinus mucosal thickening.
No acute orbital findings.

Other: No sizable mastoid effusions.
IMPRESSION: 1. Punctate acute infarct in the high left frontoparietal
perirolandic cortex.
2. Mild to moderate for age chronic microvascular ischemic disease,
atrophy, and chronic left cerebellar infarct.
3. Prior hemorrhage and possible underlying 5 mm cavernous
malformation in the high left parietal lobe.

## 2021-01-14 MED ORDER — MELATONIN 3 MG PO TABS
3.0000 mg | ORAL_TABLET | Freq: Every day | ORAL | Status: DC
  Administered 2021-01-14: 3 mg via ORAL
  Filled 2021-01-14: qty 1

## 2021-01-14 MED ORDER — QUETIAPINE FUMARATE 25 MG PO TABS
12.5000 mg | ORAL_TABLET | Freq: Once | ORAL | Status: AC
  Administered 2021-01-14: 12.5 mg via ORAL
  Filled 2021-01-14: qty 1

## 2021-01-14 MED ORDER — CHLORHEXIDINE GLUCONATE CLOTH 2 % EX PADS
6.0000 | MEDICATED_PAD | Freq: Every day | CUTANEOUS | Status: DC
  Administered 2021-01-14: 6 via TOPICAL

## 2021-01-14 MED ORDER — OXYCODONE-ACETAMINOPHEN 5-325 MG PO TABS: 1.0000 | ORAL_TABLET | Freq: Four times a day (QID) | ORAL | Status: DC | PRN

## 2021-01-14 MED ORDER — ASPIRIN EC 81 MG PO TBEC
81.0000 mg | DELAYED_RELEASE_TABLET | Freq: Every day | ORAL | Status: DC
  Administered 2021-01-14 – 2021-01-15 (×2): 81 mg via ORAL
  Filled 2021-01-14 (×2): qty 1

## 2021-01-14 MED ORDER — ATORVASTATIN CALCIUM 40 MG PO TABS
40.0000 mg | ORAL_TABLET | Freq: Every day | ORAL | Status: DC
  Administered 2021-01-15: 40 mg via ORAL
  Filled 2021-01-14: qty 1

## 2021-01-14 MED ORDER — CLOPIDOGREL BISULFATE 75 MG PO TABS
75.0000 mg | ORAL_TABLET | Freq: Every day | ORAL | Status: DC
Start: 2021-01-14 — End: 2021-01-15
  Administered 2021-01-14 – 2021-01-15 (×2): 75 mg via ORAL
  Filled 2021-01-14 (×2): qty 1

## 2021-01-14 MED ORDER — ATORVASTATIN CALCIUM 80 MG PO TABS
80.0000 mg | ORAL_TABLET | Freq: Every day | ORAL | Status: DC
  Administered 2021-01-14: 80 mg via ORAL
  Filled 2021-01-14: qty 1

## 2021-01-14 NOTE — Progress Notes (Addendum)
STROKE TEAM PROGRESS NOTE   INTERVAL HISTORY His hospitalization has been unremarkable. His daughter is at the bedside, providing support. He is hard of hearing and daughter is able to assist.   Vitals:   01/14/21 1100 01/14/21 1200 01/14/21 1300 01/14/21 1344  BP: (!) 143/64 126/72 113/62 103/61  Pulse: (!) 53 (!) 54 (!) 56 (!) 57  Resp:  18 17 (!) 23  Temp:  98.8 F (37.1 C)    TempSrc:  Oral    SpO2: 91% 94% 94% 92%  Weight:       CBC:  Recent Labs  Lab 01/13/21 1415 01/13/21 1422  WBC 7.3  --   NEUTROABS 3.8  --   HGB 15.3 15.0  HCT 45.0 44.0  MCV 93.2  --   PLT 379  --    Basic Metabolic Panel:  Recent Labs  Lab 01/13/21 1415 01/13/21 1422  NA 134* 135  K 3.8 3.9  CL 98 97*  CO2 28  --   GLUCOSE 110* 107*  BUN 11 14  CREATININE 0.83 0.70  CALCIUM 9.4  --     Lipid Panel:  Recent Labs  Lab 01/14/21 0237  CHOL 176  TRIG 40  HDL 47  CHOLHDL 3.7  VLDL 8  LDLCALC 121*    HgbA1c: No results for input(s): HGBA1C in the last 168 hours. Urine Drug Screen: No results for input(s): LABOPIA, COCAINSCRNUR, LABBENZ, AMPHETMU, THCU, LABBARB in the last 168 hours.  Alcohol Level No results for input(s): ETH in the last 168 hours.  IMAGING past 24 hours CT HEAD WO CONTRAST ( )  Result Date: 01/13/2021 CLINICAL DATA:  Neuro deficit, acute, stroke suspected post TNK EXAM: CT HEAD WITHOUT CONTRAST TECHNIQUE: Contiguous axial images were obtained from the base of the skull through the vertex without intravenous contrast. COMPARISON:  CT head 01/13/2021 2:35 p.m. BRAIN: BRAIN Cerebral ventricle sizes are concordant with the degree of cerebral volume loss. Patchy and confluent areas of decreased attenuation are noted throughout the deep and periventricular white matter of the cerebral hemispheres bilaterally, compatible with chronic microvascular ischemic disease. Chronic lacunar infarction left cerebellum. No evidence of large-territorial acute infarction. No  parenchymal hemorrhage. No mass lesion. No extra-axial collection. No mass effect or midline shift. No hydrocephalus. Basilar cisterns are patent. Vascular: No hyperdense vessel. Atherosclerotic calcifications are present within the cavernous internal carotid arteries. Skull: No acute fracture or focal lesion. Sinuses/Orbits: Polypoid mucosal thickening of the left maxillary sinus and left ethmoid sinus. Paranasal sinuses and mastoid air cells are clear. The orbits are unremarkable. Other: None. IMPRESSION: No acute intracranial abnormality. Electronically Signed   By: Tish Frederickson M.D.   On: 01/13/2021 21:35   ECHOCARDIOGRAM COMPLETE  Result Date: 01/14/2021    ECHOCARDIOGRAM REPORT   Patient Name:   Samuel Mata Date of Exam: 01/14/2021 Medical Rec #:  [id number]     Height:       67.0 in Accession #:    [id number]    Weight:       131.0 lb Date of Birth:  [birth year]    BSA:          1.689 m Patient Age:    87 years      BP:           135/69 mmHg Patient Gender: M             HR:           61 bpm. Exam Location:  Inpatient  Procedure: 2D Echo, Cardiac Doppler and Color Doppler Indications:    Stroke  History:        Patient has no prior history of Echocardiogram examinations.                 Risk Factors:Hypertension.  Sonographer:    Eulah Pont RDCS Referring Phys: [id number] Malachi Carl STACK IMPRESSIONS  1. Left ventricular ejection fraction, by estimation, is 60 to 65%. The left ventricle has normal function. The left ventricle has no regional wall motion abnormalities. Left ventricular diastolic parameters were normal.  2. Right ventricular systolic function is normal. The right ventricular size is normal. There is normal pulmonary artery systolic pressure.  3. 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 No evidence of mitral stenosis. 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  4. The aortic valve is tricuspid. Aortic valve regurgitation is not  visualized. No aortic stenosis is present.  5. The inferior vena cava is normal in size with greater than 50% respiratory variability, suggesting right atrial pressure of 3 mmHg. Comparison(s): No prior Echocardiogram. Conclusion(s)/Recommendation(s): Normal biventricular function without evidence of hemodynamically significant valvular heart disease. No intracardiac source of embolism detected on this transthoracic study. A transesophageal echocardiogram is recommended to exclude cardiac source of embolism if clinically indicated. FINDINGS  Left Ventricle: Left ventricular ejection fraction, by estimation, is 60 to 65%. The left ventricle has normal function. The left ventricle has no regional wall motion abnormalities. The left ventricular internal cavity size was normal in size. There is  no left ventricular hypertrophy. Left ventricular diastolic parameters were normal. Right Ventricle: The right ventricular size is normal. No increase in right ventricular wall thickness. Right ventricular systolic function is normal. There is normal pulmonary artery systolic pressure. The tricuspid regurgitant velocity is 2.07 m/s, and  with an assumed right atrial pressure of 3 mmHg, the estimated right ventricular systolic pressure is 20.1 mmHg. Left Atrium: Left atrial size was normal in size. Right Atrium: Right atrial size was normal in size. Pericardium: There is no evidence of pericardial effusion. 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 Mild mitral valve regurgitation. No evidence of mitral valve stenosis. Tricuspid Valve: The tricuspid valve is normal in structure. Tricuspid valve regurgitation is trivial. No evidence of tricuspid stenosis. Aortic Valve: The aortic valve is tricuspid. Aortic valve regurgitation is not visualized. No aortic stenosis is present. Pulmonic Valve: The pulmonic valve was grossly normal. Pulmonic valve  regurgitation is trivial. No evidence of pulmonic stenosis. Aorta: The aortic root, ascending aorta, aortic arch and descending aorta are all structurally normal, with no evidence of dilitation or obstruction. Venous: The inferior vena cava is normal in size with greater than 50% respiratory variability, suggesting right atrial pressure of 3 mmHg. IAS/Shunts: No atrial level shunt detected by color flow Doppler.  LEFT VENTRICLE PLAX 2D LVIDd:         5.30 cm  Diastology LVIDs:         3.50 cm  LV e' medial:    4.95 cm/s LV PW:         1.00 cm  LV E/e' medial:  12.8 LV IVS:        0.80 cm  LV e' lateral:   8.78 cm/s LVOT diam:     2.10 cm  LV E/e' lateral: 7.2 LV SV:         77 LV SV  Index:   46 LVOT Area:     3.46 cm  RIGHT VENTRICLE RV S prime:     12.00 cm/s TAPSE (M-mode): 2.0 cm LEFT ATRIUM             Index       RIGHT ATRIUM           Index LA diam:        2.10 cm 1.24 cm/m  RA Area:     11.30 cm LA Vol (A2C):   23.5 ml 13.91 ml/m RA Volume:   27.00 ml  15.98 ml/m LA Vol (A4C):   22.9 ml 13.56 ml/m LA Biplane Vol: 25.0 ml 14.80 ml/m  AORTIC VALVE LVOT Vmax:   120.00 cm/s LVOT Vmean:  70.600 cm/s LVOT VTI:    0.222 m  AORTA Ao Root diam: 3.60 cm Ao Asc diam:  3.30 cm MITRAL VALVE               TRICUSPID VALVE MV Area (PHT): 2.83 cm    TR Peak grad:   17.1 mmHg MV Decel Time: 268 msec    TR Vmax:        207.00 cm/s MV E velocity: 63.40 cm/s MV A velocity: 89.20 cm/s  SHUNTS MV E/A ratio:  0.71        Systemic VTI:  0.22 m                            Systemic Diam: 2.10 cm Jodelle Red MD Electronically signed by Jodelle Red MD Signature Date/Time: 01/14/2021/1:58:08 PM    Final    CT HEAD CODE STROKE WO CONTRAST  Result Date: 01/13/2021 CLINICAL DATA:  Code stroke. EXAM: CT HEAD WITHOUT CONTRAST TECHNIQUE: Contiguous axial images were obtained from the base of the skull through the vertex without intravenous contrast. COMPARISON:  Brain MRI 03/06/2020 FINDINGS: Brain: There is no  evidence of acute intracranial hemorrhage, extra-axial fluid collection, or infarct. There are remote lacunar infarcts in the left basal ganglia and left cerebellar hemisphere; the cerebellar hemisphere infarct is new since 03/06/2020. There is mild parenchymal volume loss with prominence of the ventricular system, unchanged. Foci of hypodensity in the subcortical and periventricular white matter likely reflects sequela of chronic white matter microangiopathy, grossly similar to the prior MRI. There is no mass lesion. There is no midline shift. Vascular: There is calcification of the bilateral cavernous ICAs. Skull: Normal. Negative for fracture or focal lesion. Sinuses/Orbits: There is mild mucosal thickening in the ethmoid air cells. There is a polypoid lesion originating in the left ethmoid air cells extending into the left maxillary sinus not present on the prior MRI. The globes and orbits are unremarkable. Other: None. ASPECTS [facility] Stroke Program Early CT Score) - Ganglionic level infarction (caudate, lentiform nuclei, internal capsule, insula, M1-M3 cortex): 7 - Supraganglionic infarction (M4-M6 cortex): 3 Total score (0-10 with 10 being normal): 10 IMPRESSION: 1. No acute intracranial hemorrhage or infarct. This result was paged via AMION at the time of interpretation on 01/13/2021 at 2:33 pm to provider Dr Selina Cooley, who verbally acknowledged these results. 2. ASPECTS is 10 3. Small remote lacunar infarct in the left cerebellar hemisphere, new since 03/07/2019. 4. Polypoid lesion in the left ethmoid air cells extending into the maxillary sinus as above is new since 03/06/2020. This most likely reflects a benign polyp; however, recommend nonemergent correlation with direct visualization. Electronically Signed   By: Selena Lesser.D.  On: 01/13/2021 14:35   CT ANGIO HEAD NECK W WO CM W PERF (CODE STROKE)  Result Date: 01/13/2021 CLINICAL DATA:  Neuro deficit EXAM: CT ANGIOGRAPHY HEAD AND NECK TECHNIQUE:  Multidetector CT imaging of the head and neck was performed using the standard protocol during bolus administration of intravenous contrast. Multiplanar CT image reconstructions and MIPs were obtained to evaluate the vascular anatomy. Carotid stenosis measurements (when applicable) are obtained utilizing NASCET criteria, using the distal internal carotid diameter as the denominator. CONTRAST:  OMNIPAQUE IOHEXOL 350 MG/ML SOLN COMPARISON:  Same-day noncontrast CT head, brain MRI 03/06/2020 FINDINGS: CTA NECK FINDINGS Aortic arch: Standard branching. Imaged portion shows no evidence of aneurysm or dissection. There is mild calcification of the aortic arch and proximal great vessels. No significant stenosis of the major arch vessel origins. Right carotid system: There is a linear filling defect in the distal common carotid artery extending into the internal carotid artery consistent with a dissection flap. There is less than 50% narrowing of the true lumen. There is mild soft and calcified atherosclerotic plaque of the proximal right internal carotid artery without hemodynamically significant stenosis. Left carotid system: There is mild soft and calcified atherosclerotic plaque at the left carotid bulb without hemodynamically significant stenosis or occlusion. There is no dissection or aneurysm. Vertebral arteries: There is focal soft and calcified atherosclerotic plaque at the origin of the right vertebral artery resulting in approximately 50% narrowing. The remainder of the right vertebral artery is widely patent. There is calcified atherosclerotic plaque in the proximal left internal carotid artery without hemodynamically significant stenosis or occlusion. The remainder of the left vertebral artery is widely patent. There is no evidence of dissection. Skeleton: There is multilevel degenerative change of the cervical spine, most advanced at C5-C6. There is no acute osseous abnormality or aggressive osseous  lesion. Other neck: The soft tissues are unremarkable. Multiple dental caries are noted. Upper chest: There are multiple nodular opacities scattered throughout both lung apices with individual nodules measuring up to 7 mm. Review of the MIP images confirms the above findings CTA HEAD FINDINGS Anterior circulation: There is calcification of the bilateral cavernous ICAs without hemodynamically significant stenosis or occlusion. The bilateral MCAs and ACAs are patent. There is no aneurysm. Posterior circulation: The V4 segments of the vertebral arteries are patent. The basilar artery is patent. The bilateral PCAs are patent. Venous sinuses: Suboptimally assessed due to contrast timing. Anatomic variants: None. Review of the MIP images confirms the above findings IMPRESSION: 1. Linear filling defect in the distal right common carotid artery extending to the proximal internal carotid artery suspicious for acute dissection, with mixing artifact considered less likely though not entirely excluded. Short follow up may be considered to assess for persistence. There is less than 50% narrowing of the true lumen of the common and internal carotid arteries. This result was called by telephone at the time of interpretation on 01/13/2021 at 3:11 pm to provider [facility] , who verbally acknowledged these results. 2. Scattered atherosclerotic disease throughout the remainder of the vasculature of the head and neck with no other significant stenosis, occlusion, dissection, or aneurysm. 3. Scattered nodular opacities throughout both lung apices most suggestive of multifocal infectious/inflammatory etiology. Recommend dedicated imaging of the chest or follow-up CT in 1-3 months to assess for resolution. Electronically Signed   By: Lesia Hausen M.D.   On: 01/13/2021 15:12    PHYSICAL EXAM   Physical Exam  Constitutional: Appears well-developed and well-nourished. He is seen sitting  up in chair, daughter at bedside.  Psych:  Affect appropriate to situation Eyes: No scleral injection HENT: No OP obstruction. He is markedly hard of hearing.  MSK: no joint deformities.  Cardiovascular: Normal rate and regular rhythm.  Respiratory: Effort normal, non-labored breathing GI: Soft.  No distension. There is no tenderness.  Skin: WDI  Neuro: Mental Status: Patient is awake, alert, oriented to person, place, month, year, and situation.  Patient is able to give a clear and coherent history/ age appropriate forgetfulness  No signs of aphasia or neglect   Cranial Nerves: II: Visual Fields are full. Pupils are equal, round, and reactive to light.    III,IV, VI: EOMI without ptosis or diploplia.  V: Facial sensation is symmetric to temperature VII: Facial movement is symmetric resting and smiling VIII: Hearing is intact to voice X: Palate elevates symmetrically XI: Shoulder shrug is symmetric. XII: Tongue protrudes midline without atrophy or fasciculations.  Motor: Tone is normal. Bulk is normal. 5/5 strength was present in all four extremities.   Sensory: Sensation is symmetric to light touch and temperature in the arms and legs. No extinction to DSS present.     Plantars: Toes are downgoing bilaterally.   Cerebellar: FNF and HKS are intact bilaterally    ASSESSMENT/PLAN Mr. Landers Prajapati is a 87 y.o. male with history of hypertension, remote cerebellar stroke who presented by EMS for acute onset of aphasia.  Last known well was 12:00 today.  He came inside and wife noticed that he was not making sense when he spoke.  He was brought in by EMS supreme activated stroke code.  On examination he was able to answer neither question correctly.  He was able to follow commands.  He had no facial palsy.  He had no drift in any extremity.  He did not have any ataxia or sensory deficit.  He had a moderate aphasia with normal articulation and no neglect. NIHSS = 3.   CT head showed NAICP and no hemorrhage. TNK was  administered.  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 Cervical dissection is not a contraindication to thrombolytics and therefore the TNK was not reversed. Upon further chart review today, the patient sustained a fall in 2006 off a ladder, and could have injured himself at that time.   Patient believes this presentation is related to a toothache that he's experienced over the past few weeks and this presentation may be an infection related to his tooth pain.   MRI brain shows embolic appearing punctate stroke at left perirolandic cortex . Will start aspirin and plavix 3 months, given his right carotid artery dissection, as well as secondary stroke prevention. Will also place loop recorder to assess for possible atrial fibrillation or other chaotic rhythms.    Stroke   Code Stroke  CT head No acute abnormality.  Small vessel disease. Atrophy.  ASPECTS 10.     CTA head & neck:  1. Linear filling defect in the distal right common carotid artery extending to the proximal internal carotid artery suspicious for acute dissection, with mixing artifact considered less likely though not entirely excluded. Short follow up may be considered to assess for persistence. There is less than 50% narrowing of the true lumen of the common and internal carotid arteries. This result was called by telephone at the  time of interpretation on 01/13/2021 at 3:11 pm [facility] who verbally acknowledged these results. 2. Scattered atherosclerotic disease throughout the remainder of the vasculature of the head and neck with no other significant stenosis, occlusion, dissection, or aneurysm. 3. Scattered nodular opacities throughout both lung apices most suggestive of multifocal infectious/inflammatory etiology. Recommend dedicated  imaging of the chest or follow-up CT in 1-3 months to assess for resolution.  MRI brain done:  Punctate acute infarct in the high left frontoparietal perirolandic cortex. 2D Echo LVEF 60-65%, normal size LA, no IA shunt  LDL 121 HgbA1c No results found for requested labs within last [id number] hours. VTE prophylaxis -  scd    Diet   Diet Heart Room service appropriate? Yes; Fluid consistency: Thin   No antithrombotic prior to admission, now on aspirin 81mg  and plavix 75mg  for three months. Then continue with aspirin alone.  Therapy recommendations:  home health PT with supervision Disposition:  home     LDL 121, goal < 70 Add lipitor 40mg  daily   Continue statin at discharge  HgbA1c No results found for requested labs within last [id number] hours., goal < 7.0 CBGs Recent Labs    01/13/21 1414  GLUCAP 115*    SSI  Other Stroke Risk Factors  Advanced Age >/= 6465   Hx stroke/TIA  Family hx stroke    Other Active Problems Nocardia pneumonia   Hospital day # 1 I have personally obtained history,examined this patient, reviewed notes, independently viewed imaging studies, participated in medical decision making and plan of care.ROS completed by me personally and pertinent positives fully documented  I have made any additions or clarifications directly to the above note. Agree with note above.  He presented with transient episode of expressive aphasia likely left hemispheric TIA versus small infarct and received thrombolysis with IV TNKase with good resolution of his symptoms.  Recommend close neurological monitoring and strict blood pressure control as per postthrombolytic protocol.  Check MRI scan of the brain later today.  Mobilize out of bed.  Therapy consults.  May need prolonged cardiac monitoring and discharge for paroxysmal A. fib.  Long discussion with patient and daughter and answered questions. This patient is critically ill and at significant risk of neurological worsening, death  and care requires constant monitoring of vital signs, hemodynamics,respiratory and cardiac monitoring, extensive review of multiple databases, frequent neurological assessment, discussion with family, other specialists and medical decision making of high complexity.I have made any additions or clarifications directly to the above note.This critical care time does not reflect procedure time, or teaching time or supervisory time of PA/NP/Med Resident etc but could involve care discussion time.  I spent 30 minutes of neurocritical care time  in the care of  this patient.     Delia HeadyPramod Dabria Wadas, MD Medical Director [facility] Pager: [phone] 01/14/2021 4:53 PM     To contact Stroke Continuity provider, please refer to [email or web address]. After hours, contact General Neurology

## 2021-01-14 NOTE — TOC Initial Note (Addendum)
Transition of Care [facility]) - Initial/Assessment Note    Patient Details  Name: Samuel Mata MRN: [id number] Date of Birth: Mar 04, 1931  Transition of Care [facility]) CM/SW Contact:    Lawerance Sabal, RN Phone Number: 01/14/2021, 12:27 PM  Clinical Narrative:                 Sherron Monday w patient's wife over the phone to discuss discharge resources and needs. They have RW at home he can use. Wife is currently getting OP PT services at [facility]. She is agreeable to [facility] services at time of discharge. Discussed HH ratings and will check for availability with  Chip Boer- Accepted for PT OT Patient needs orders for home health     Expected Discharge Plan: Home w Home Health Services Barriers to Discharge: Continued Medical Work up   Patient Goals and CMS Choice Patient states their goals for this hospitalization and ongoing recovery are:: to go home CMS Medicare.gov Compare Post Acute Care list provided to:: Other (Comment Required) Choice offered to / list presented to : Spouse  Expected Discharge Plan and Services Expected Discharge Plan: Home w Home Health Services   Discharge Planning Services: CM Consult Post Acute Care Choice: Home Health Living arrangements for the past 2 months: Single Family Home                 DME Arranged: N/A         HH Arranged: PT, OT          Prior Living Arrangements/Services Living arrangements for the past 2 months: Single Family Home Lives with:: Spouse                   Activities of Daily Living Home Assistive Devices/Equipment: Cane (specify quad or straight) ADL Screening (condition at time of admission) Patient's cognitive ability adequate to safely complete daily activities?: Yes Is the patient deaf or have difficulty hearing?: Yes Does the patient have difficulty seeing, even when wearing glasses/contacts?: Yes Does the patient have difficulty concentrating, remembering, or making decisions?: No Patient able to express need  for assistance with ADLs?: No Does the patient have difficulty dressing or bathing?: No Independently performs ADLs?: Yes (appropriate for developmental age) Does the patient have difficulty walking or climbing stairs?: No Weakness of Legs: Both Weakness of Arms/Hands: None  Permission Sought/Granted                  Emotional Assessment              Admission diagnosis:  Carotid artery dissection (HCC) [I77.71] Acute ischemic stroke [facility]) [I63.9] Patient Active Problem List   Diagnosis Date Noted   Acute ischemic stroke [facility])    Carotid dissection, bilateral (HCC)    Aphasia    Immunization due 05/20/2020   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   Medication monitoring encounter 03/07/2020   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   Bronchiectasis without complication (HCC) 11/22/2019   Hereditary spherocytosis (HCC) 08/29/2019   Seborrheic keratoses 08/29/2019   OSA (obstructive sleep apnea) 03/19/2019   History of TIA (transient ischemic attack) 01/23/2018   Hemoptysis 07/15/2017   Pulmonary infiltrates 07/15/2017   History of tobacco use 06/06/2017   Essential hypertension 09/22/2015   Gastroesophageal reflux disease without esophagitis 06/05/2014   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 AT [address] OF [facility] [address] [place] [address] Phone: [phone] Fax: [phone]     Social Determinants of Health (SDOH) Interventions    Readmission Risk Interventions No flowsheet data found.

## 2021-01-14 NOTE — Progress Notes (Signed)
  Echocardiogram 2D Echocardiogram has been performed.  Samuel Mata 01/14/2021, 10:28 AM

## 2021-01-14 NOTE — Evaluation (Signed)
Physical Therapy Evaluation Patient Details Name: Samuel Mata MRN: [id number] DOB: 1930/07/15 Today's Date: 01/14/2021   History of Present Illness  87 y/o male presented to ED on 8/30 for acute onset of aphasia. TNK given 1430 on 8/30. CT head negative. CTA head/neck with filling defect in distal R common carotid artery extending to proximal ICA suspicious of acute dissection. MRI pending. PMH: HTN, remote cerebellar stroke  Clinical Impression  PTA, patient lives with wife and reports independence with use of SPC for mobility. Patient's wife recently had cervical surgery and unable to assist physically. Patient presents with generalized weakness, impaired balance, decreased activity tolerance. Patient currently requiring min guard for mobility with use of RW and stair negotiation with R handrail. Patient will benefit from skilled PT services during acute stay to address listed deficits. Recommend HHPT at discharge to maximize functional independence and safety. Educated patient and daughter on use of RW for mobility in the home for safety, both verbalized understanding.     Follow Up Recommendations Home health PT;Supervision - Intermittent    Equipment Recommendations  Rolling Samyuktha Brau with 5" wheels    Recommendations for Other Services       Precautions / Restrictions Precautions Precautions: Fall Restrictions Weight Bearing Restrictions: No      Mobility  Bed Mobility Overal bed mobility: Needs Assistance Bed Mobility: Supine to Sit     Supine to sit: Supervision;HOB elevated     General bed mobility comments: supervision for safety with HOB elevated    Transfers Overall transfer level: Needs assistance Equipment used: None Transfers: Sit to/from Stand Sit to Stand: Min guard         General transfer comment: min guard for safety. Patient bracing LEs against bed initially to steady  Ambulation/Gait Ambulation/Gait assistance: Min assist;Min guard Gait  Distance (Feet): 250 Feet Assistive device: 1 person hand held assist;Rolling Kian Ottaviano (2 wheeled) Gait Pattern/deviations: Step-through pattern;Decreased stride length;Trunk flexed Gait velocity: decreased   General Gait Details: initially with HHAx1 requiring minA for balance. Provided RW for stability. Patient's balance improved with increasing distance. Educated patient on use of RW for stability and safety at home.  Stairs Stairs: Yes Stairs assistance: Min guard Stair Management: One rail Right;Alternating pattern;Forwards Number of Stairs: 3 General stair comments: min guard for safety with use of handrail on R to mimic use of SPC on stairs. Patient with step to pattern on descent for safety.  Wheelchair Mobility    Modified Rankin (Stroke Patients Only) Modified Rankin (Stroke Patients Only) Pre-Morbid Rankin Score: No symptoms Modified Rankin: Moderately severe disability     Balance Overall balance assessment: Mild deficits observed, not formally tested                                           Pertinent Vitals/Pain Pain Assessment: No/denies pain    Home Living Family/patient expects to be discharged to:: Private residence Living Arrangements: Spouse/significant other Available Help at Discharge: Family;Available PRN/intermittently Type of Home: House Home Access: Stairs to enter Entrance Stairs-Rails: None Entrance Stairs-Number of Steps: 1-2 Home Layout: One level Home Equipment: Shereece Wellborn - 2 wheels;Cane - single point;Bedside commode;Grab bars - tub/shower      Prior Function Level of Independence: Independent with assistive device(s)         Comments: uses SPC for mobility, drives, ambulates in neighborhood daily with [facility]     Hand Dominance  Extremity/Trunk Assessment   Upper Extremity Assessment Upper Extremity Assessment: Defer to OT evaluation    Lower Extremity Assessment Lower Extremity Assessment: Generalized  weakness    Cervical / Trunk Assessment Cervical / Trunk Assessment: Kyphotic  Communication   Communication: HOH  Cognition Arousal/Alertness: Awake/alert Behavior During Therapy: WFL for tasks assessed/performed Overall Cognitive Status: Within Functional Limits for tasks assessed                                 General Comments: WFL tasks assessed. When asked about current situation, patient believes confusion came from broken tooth rather than stroke. Following all commands appropriately with increased time due to [facility]      General Comments      Exercises     Assessment/Plan    PT Assessment Patient needs continued PT services  PT Problem List Decreased strength;Decreased activity tolerance;Decreased balance;Decreased mobility       PT Treatment Interventions DME instruction;Stair training;Gait training;Therapeutic activities;Functional mobility training;Balance training;Therapeutic exercise;Patient/family education    PT Goals (Current goals can be found in the Care Plan section)  Acute Rehab PT Goals Patient Stated Goal: to go home PT Goal Formulation: With patient/family Time For Goal Achievement: 01/28/21 Potential to Achieve Goals: Good    Frequency Min 4X/week   Barriers to discharge        Co-evaluation               AM-PAC PT "6 Clicks" Mobility  Outcome Measure Help needed turning from your back to your side while in a flat bed without using bedrails?: A Little Help needed moving from lying on your back to sitting on the side of a flat bed without using bedrails?: A Little Help needed moving to and from a bed to a chair (including a wheelchair)?: A Little Help needed standing up from a chair using your arms (e.g., wheelchair or bedside chair)?: A Little Help needed to walk in hospital room?: A Little Help needed climbing 3-5 steps with a railing? : A Little 6 Click Score: 18    End of Session Equipment Utilized During Treatment:  Gait belt Activity Tolerance: Patient tolerated treatment well Patient left: in chair;with call bell/phone within reach;with chair alarm set;with nursing/sitter in room Nurse Communication: Mobility status PT Visit Diagnosis: Unsteadiness on feet (R26.81);Muscle weakness (generalized) (M62.81)    Time: 6226-3335 PT Time Calculation (min) (ACUTE ONLY): 28 min   Charges:   PT Evaluation $PT Eval Low Complexity: 1 Low PT Treatments $Therapeutic Activity: 8-22 mins        Kodi Steil A. Dan Humphreys PT, DPT Acute Rehabilitation Services Pager [phone] Office [phone]   Viviann Spare 01/14/2021, 9:24 AM

## 2021-01-14 NOTE — Evaluation (Signed)
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   Bronchiectasis without complication (HCC) 11/22/2019   Hereditary spherocytosis (HCC) 08/29/2019   Seborrheic keratoses 08/29/2019   OSA (obstructive sleep apnea) 03/19/2019   History of TIA (transient ischemic attack) 01/23/2018   Hemoptysis 07/15/2017   Pulmonary infiltrates 07/15/2017   History of tobacco use 06/06/2017   Essential hypertension 09/22/2015   Gastroesophageal reflux disease without esophagitis 06/05/2014   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 TNK given 1430 on 8/30. CT head negative. CTA head/neck with filling defect in distal R common carotid artery extending to proximal ICA suspicious of acute dissection. MRI pending. PMH: HTN, remote cerebellar stroke.   Assessment / Plan / Recommendation Clinical Impression  Pt was seen for speech and language evaluation. Pt showed no overt signs of speech or language deficits throughout. SLP administered the Cognistat. Overall, pt cognitive impairments c/b memory  (storage/retrieval) deficits. Orientation, attention, language, repetition, naming, calculations, reasoning, and judgement all WFL for tasks assessed. Memory improved upon verbal cues from SLP. Recommend f/u by SLP for cognitive therapy to introduce external/internal memory aids.    SLP Assessment  SLP Recommendation/Assessment: Patient needs continued Speech Lanaguage Pathology Services SLP Visit Diagnosis: Cognitive communication deficit (R41.841)    Follow Up Recommendations  None    Frequency and Duration min 1 x/week  1 week      SLP Evaluation Cognition  Overall Cognitive Status: Within Functional Limits for tasks assessed Arousal/Alertness: Awake/alert Orientation Level: (P) Oriented X4 (Simultaneous filing. User may not have seen previous data.) Year: 2022 Month: August Day of Week: Correct Attention: Sustained Sustained Attention: Appears intact Memory: Impaired Memory Impairment: Storage deficit;Retrieval deficit Awareness: Appears intact Problem Solving: Appears intact Executive Function: Reasoning Reasoning: Appears intact Safety/Judgment: Appears intact       Comprehension  Auditory Comprehension Overall Auditory Comprehension: Appears within functional limits for tasks assessed Yes/No Questions: Within Functional Limits Commands: Within Functional Limits Conversation: Complex Visual Recognition/Discrimination Discrimination: Within Function Limits Reading Comprehension Reading Status: Within funtional limits    Expression Expression Primary Mode of Expression: Verbal Verbal Expression Overall Verbal Expression: Appears within functional limits for tasks assessed Initiation: No impairment Level of Generative/Spontaneous Verbalization: Conversation Repetition: No impairment Naming: No impairment Pragmatics: No impairment Written Expression Written Expression: Not tested   Oral / Motor  Oral Motor/Sensory Function Overall Oral Motor/Sensory Function:  Within functional limits Motor Speech Overall Motor Speech: Appears within functional limits for tasks assessed Respiration: Within functional limits Phonation: Normal Resonance: Within functional limits Articulation: Within functional limitis Intelligibility:  Intelligible Motor Planning: Witnin functional limits Motor Speech Errors: Not applicable   GO          Functional Assessment Tool Used: Cognistat        Jeannie Done, SLP-Student   Jeannie Done 01/14/2021, 3:53 PM

## 2021-01-15 ENCOUNTER — Other Ambulatory Visit: Payer: Self-pay

## 2021-01-15 ENCOUNTER — Encounter (HOSPITAL_COMMUNITY)
Admission: EM | Disposition: A | Discharge status: Home Health | Payer: Self-pay | Source: Home / Self Care | Attending: Neurology

## 2021-01-15 DIAGNOSIS — I639 Cerebral infarction, unspecified: Secondary | ICD-10-CM

## 2021-01-15 HISTORY — PX: LOOP RECORDER INSERTION: EP1214

## 2021-01-15 SURGERY — LOOP RECORDER INSERTION
Anesthesia: LOCAL

## 2021-01-15 MED ORDER — ASPIRIN 81 MG PO TBEC: 81.0000 mg | DELAYED_RELEASE_TABLET | Freq: Every day | ORAL | 11 refills | Status: DC

## 2021-01-15 MED ORDER — CLOPIDOGREL BISULFATE 75 MG PO TABS
75.0000 mg | ORAL_TABLET | Freq: Every day | ORAL | 3 refills | Status: DC
Start: 2021-01-16 — End: 2021-09-17

## 2021-01-15 MED ORDER — ATORVASTATIN CALCIUM 40 MG PO TABS: 40.0000 mg | ORAL_TABLET | Freq: Every day | ORAL | 3 refills | Status: DC

## 2021-01-15 MED ORDER — LIDOCAINE-EPINEPHRINE 1 %-1:100000 IJ SOLN
INTRAMUSCULAR | Status: AC
  Filled 2021-01-15: qty 1

## 2021-01-15 MED ORDER — LIDOCAINE-EPINEPHRINE 1 %-1:100000 IJ SOLN
INTRAMUSCULAR | Status: DC | PRN
  Administered 2021-01-15: 10 mL

## 2021-01-15 SURGICAL SUPPLY — 2 items
MONITOR MOBILE MNGR LINQ22 (Prosthesis & Implant Heart) ×1 IMPLANT
PACK LOOP INSERTION (CUSTOM PROCEDURE TRAY) ×2 IMPLANT

## 2021-01-15 NOTE — Consult Note (Addendum)
ELECTROPHYSIOLOGY CONSULT NOTE  Patient ID: Samuel Mata MRN: [id number], DOB/AGE: 12/06/1930   Admit date: 01/13/2021 Date of Consult: 01/15/2021  Primary Physician: [facility] Primary Cardiologist: none Reason for Consultation: Cryptogenic stroke; recommendations regarding Implantable Loop Recorder, requested by Dr. Pearlean Brownie  History of Present Illness Samuel Mata was admitted on 01/13/2021 with speech difficulties, found with stroke >> treated with TNK .    PMHx includes: HTN, prior stroke  Neurology notes: Punctate acute infarct in the high left frontoparietal perirolandic cortex.  he has undergone workup for stroke including echocardiogram and carotid angios.  The patient has been monitored on telemetry which has demonstrated sinus rhythm with no arrhythmias.    Neurology has deferred TEE   Echocardiogram this admission demonstrated   IMPRESSIONS   1. Left ventricular ejection fraction, by estimation, is 60 to 65%. The  left ventricle has normal function. The left ventricle has no regional  wall motion abnormalities. Left ventricular diastolic parameters were  normal.   2. Right ventricular systolic function is normal. The right ventricular  size is normal. There is normal pulmonary artery systolic pressure.   3. 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 No  evidence of mitral stenosis. There is mild holosystolic prolapse of  posterior leaflet of the mitral valve.   4. The aortic valve is tricuspid. Aortic valve regurgitation is not  visualized. No aortic stenosis is present.   5. The inferior vena cava is normal in size with greater than 50%  respiratory variability, suggesting right atrial pressure of 3 mmHg.   Comparison(s): No prior Echocardiogram.    Lab work is reviewed.   Prior to admission, the patient denies chest pain, shortness of breath, dizziness, palpitations, or syncope.  They are recovering from  their stroke with plans to home today at discharge.      Past Medical History:  Diagnosis Date   Enlarged prostate    Hypertension    Renal stones      Surgical History:  Past Surgical History:  Procedure Laterality Date   APPENDECTOMY     CHOLECYSTECTOMY     NOSE SURGERY     SPLENECTOMY, TOTAL       Medications Prior to Admission  Medication Sig Dispense Refill Last Dose   acetaminophen (TYLENOL) 325 MG tablet Take 325-650 mg by mouth every 6 (six) hours as needed for moderate pain.    Past Week   ibuprofen (ADVIL) 200 MG tablet Take 200 mg by mouth every 6 (six) hours as needed for mild pain.   Past Week   Naphazoline HCl (CLEAR EYES OP) Place 1 drop into both eyes at bedtime.   Past Week   sodium chloride (OCEAN) 0.65 % nasal spray Place 1 spray into the nose daily as needed for congestion.    01/13/2021   Respiratory Therapy Supplies (FLUTTER) DEVI 1 puff by Does not apply route in the morning and at bedtime. (Patient not taking: Reported on 01/13/2021) 1 each 0 Not Taking   sodium chloride HYPERTONIC 3 % nebulizer solution Take by nebulization as needed for other. (Patient not taking: Reported on 01/13/2021) 750 mL 12 Not Taking    Inpatient Medications:   aspirin EC  81 mg Oral Daily   atorvastatin  40 mg Oral Daily   Chlorhexidine Gluconate Cloth  6 each Topical Daily   clopidogrel  75 mg Oral Daily   melatonin  3 mg Oral QHS    Allergies:  Allergies  Allergen Reactions   Codeine     unknown    Social History   Socioeconomic History   Marital status: Married    Spouse name: Not on file   Number of children: Not on file   Years of education: Not on file   Highest education level: Not on file  Occupational History   Not on file  Tobacco Use   Smoking status: Former    Packs/day: 1.00    Years: 30.00    Pack years: 30.00    Types: Cigarettes    Quit date: 10/07/1984    Years since quitting: 36.2   Smokeless tobacco: Never  Substance and Sexual  Activity   Alcohol use: Never   Drug use: Never   Sexual activity: Not on file  Other Topics Concern   Not on file  Social History Narrative   Not on file   Social Determinants of Health   Financial Resource Strain: Not on file  Food Insecurity: Not on file  Transportation Needs: Not on file  Physical Activity: Not on file  Stress: Not on file  Social Connections: Not on file  Intimate Partner Violence: Not on file     Family History  Problem Relation Age of Onset   Lung disease Neg Hx       Review of Systems: All other systems reviewed and are otherwise negative except as noted above.  Physical Exam: Vitals:   01/14/21 1700 01/14/21 1810 01/14/21 1933 01/15/21 0755  BP: 109/61 134/86 (!) 142/78 (!) 154/90  Pulse: (!) 51 61 77 70  Resp: 18 19 18 18   Temp:  98.9 F (37.2 C) 98 F (36.7 C) 98.1 F (36.7 C)  TempSrc:  Oral Oral Oral  SpO2: 91% 100% 100% 95%  Weight:        GEN- The patient is well appearing, alert and oriented x 3 today.   Head- normocephalic, atraumatic Eyes-  Sclera clear, conjunctiva pink Ears- hearing intact Oropharynx- clear Neck- supple Lungs- CTA b/l, normal work of breathing Heart- RRR, no murmurs, rubs or gallops  GI- soft, NT, ND Extremities- no clubbing, cyanosis, or edema MS- no significant deformity or atrophy Skin- no rash or lesion Psych- euthymic mood, full affect   Labs:   Lab Results  Component Value Date   WBC 7.3 01/13/2021   HGB 15.0 01/13/2021   HCT 44.0 01/13/2021   MCV 93.2 01/13/2021   PLT 379 01/13/2021    Recent Labs  Lab 01/13/21 1415 01/13/21 1422  NA 134* 135  K 3.8 3.9  CL 98 97*  CO2 28  --   BUN 11 14  CREATININE 0.83 0.70  CALCIUM 9.4  --   PROT 7.1  --   BILITOT 0.9  --   ALKPHOS 55  --   ALT 11  --   AST 24  --   GLUCOSE 110* 107*   No results found for: CKTOTAL, CKMB, CKMBINDEX, TROPONINI Lab Results  Component Value Date   CHOL 176 01/14/2021   Lab Results  Component Value  Date   HDL 47 01/14/2021   Lab Results  Component Value Date   LDLCALC 121 (H) 01/14/2021   Lab Results  Component Value Date   TRIG 40 01/14/2021   Lab Results  Component Value Date   CHOLHDL 3.7 01/14/2021   No results found for: LDLDIRECT  No results found for: DDIMER   Radiology/Studies:  CT HEAD WO CONTRAST (01/16/2021) Result Date: 01/13/2021 CLINICAL DATA:  Neuro  deficit, acute, stroke suspected post TNK EXAM: CT HEAD WITHOUT CONTRAST TECHNIQUE: Contiguous axial images were obtained from the base of the skull through the vertex without intravenous contrast. COMPARISON:  CT head 01/13/2021 2:35 p.m. BRAIN: BRAIN Cerebral ventricle sizes are concordant with the degree of cerebral volume loss. Patchy and confluent areas of decreased attenuation are noted throughout the deep and periventricular white matter of the cerebral hemispheres bilaterally, compatible with chronic microvascular ischemic disease. Chronic lacunar infarction left cerebellum. No evidence of large-territorial acute infarction. No parenchymal hemorrhage. No mass lesion. No extra-axial collection. No mass effect or midline shift. No hydrocephalus. Basilar cisterns are patent. Vascular: No hyperdense vessel. Atherosclerotic calcifications are present within the cavernous internal carotid arteries. Skull: No acute fracture or focal lesion. Sinuses/Orbits: Polypoid mucosal thickening of the left maxillary sinus and left ethmoid sinus. Paranasal sinuses and mastoid air cells are clear. The orbits are unremarkable. Other: None. IMPRESSION: No acute intracranial abnormality. Electronically Signed   By: Tish Frederickson M.D.   On: 01/13/2021 21:35   MR BRAIN WO CONTRAST Result Date: 01/14/2021 CLINICAL DATA:  Neuro deficit, acute, stroke suspected EXAM: MRI HEAD WITHOUT CONTRAST TECHNIQUE: Multiplanar, multiecho pulse sequences of the brain and surrounding structures were obtained without intravenous contrast. COMPARISON:  CT head  01/13/2021. FINDINGS: Brain: Punctate acute infarct in the high left frontoparietal perirolandic cortex (series 2, image 45). No significant edema or mass effect. Mild-to-moderate for age T2 hyperintensities in the periventricular and pontine white matter, nonspecific but compatible with chronic microvascular ischemic disease. Mild to moderate for age atrophy. Chronic infarct in the left cerebellum. No evidence of acute hemorrhage, hydrocephalus, mass lesion, or extra-axial fluid collection. Focus of susceptibility artifact with associated round 5 mm T2 hyperintensity in the high posterior left parietal lobe (series 5, image 19; series 7, image 25), compatible with prior hemorrhage in possible cavernous malformation. No associated edema. Vascular: Major arterial flow voids are maintained at the skull base. Skull and upper cervical spine: Normal marrow signal. Sinuses/Orbits: Mild to moderate paranasal sinus mucosal thickening. No acute orbital findings. Other: No sizable mastoid effusions. IMPRESSION: 1. Punctate acute infarct in the high left frontoparietal perirolandic cortex. 2. Mild to moderate for age chronic microvascular ischemic disease, atrophy, and chronic left cerebellar infarct. 3. 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 Electronically Signed   By: Feliberto Harts M.D.   On: 01/14/2021 14:42   CT HEAD CODE STROKE WO CONTRAST Result Date: 01/13/2021 CLINICAL DATA:  Code stroke. EXAM: CT HEAD WITHOUT CONTRAST TECHNIQUE: Contiguous axial images were obtained from the base of the skull through the vertex without intravenous contrast. COMPARISON:  Brain MRI 03/06/2020 FINDINGS: Brain: There is no evidence of acute intracranial hemorrhage, extra-axial fluid collection, or infarct. There are remote lacunar infarcts in the left basal ganglia and left cerebellar hemisphere; the cerebellar hemisphere infarct is new since 03/06/2020. There is mild  parenchymal volume loss with prominence of the ventricular system, unchanged. Foci of hypodensity in the subcortical and periventricular white matter likely reflects sequela of chronic white matter microangiopathy, grossly similar to the prior MRI. There is no mass lesion. There is no midline shift. Vascular: There is calcification of the bilateral cavernous ICAs. Skull: Normal. Negative for fracture or focal lesion. Sinuses/Orbits: There is mild mucosal thickening in the ethmoid air cells. There is a polypoid lesion originating in the left ethmoid air cells extending into the left maxillary sinus not present on the prior MRI. The globes  and orbits are unremarkable. Other: None. ASPECTS [facility] Stroke Program Early CT Score) - Ganglionic level infarction (caudate, lentiform nuclei, internal capsule, insula, M1-M3 cortex): 7 - Supraganglionic infarction (M4-M6 cortex): 3 Total score (0-10 with 10 being normal): 10 IMPRESSION: 1. No acute intracranial hemorrhage or infarct. This result was paged via AMION at the time of interpretation on 01/13/2021 at 2:33 pm to provider Dr Selina Cooley, who verbally acknowledged these results. 2. ASPECTS is 10 3. Small remote lacunar infarct in the left cerebellar hemisphere, new since 03/07/2019. 4. Polypoid lesion in the left ethmoid air cells extending into the maxillary sinus as above is new since 03/06/2020. This most likely reflects a benign polyp; however, recommend nonemergent correlation with direct visualization. Electronically Signed   By: Lesia Hausen M.D.   On: 01/13/2021 14:35   CT ANGIO HEAD NECK W WO CM W PERF (CODE STROKE) Result Date: 01/13/2021 CLINICAL DATA:  Neuro deficit EXAM: CT ANGIOGRAPHY HEAD AND NECK TECHNIQUE: Multidetector CT imaging of the head and neck was performed using the standard protocol during bolus administration of intravenous contrast. Multiplanar CT image reconstructions and MIPs were obtained to evaluate the vascular anatomy. Carotid stenosis  measurements (when applicable) are obtained utilizing NASCET criteria, using the distal internal carotid diameter as the denominator. CONTRAST:  OMNIPAQUE IOHEXOL 350 MG/ML SOLN COMPARISON:  Same-day noncontrast CT head, brain MRI 03/06/2020 FINDINGS: CTA NECK FINDINGS Aortic arch: Standard branching. Imaged portion shows no evidence of aneurysm or dissection. There is mild calcification of the aortic arch and proximal great vessels. No significant stenosis of the major arch vessel origins. Right carotid system: There is a linear filling defect in the distal common carotid artery extending into the internal carotid artery consistent with a dissection flap. There is less than 50% narrowing of the true lumen. There is mild soft and calcified atherosclerotic plaque of the proximal right internal carotid artery without hemodynamically significant stenosis. Left carotid system: There is mild soft and calcified atherosclerotic plaque at the left carotid bulb without hemodynamically significant stenosis or occlusion. There is no dissection or aneurysm. Vertebral arteries: There is focal soft and calcified atherosclerotic plaque at the origin of the right vertebral artery resulting in approximately 50% narrowing. The remainder of the right vertebral artery is widely patent. There is calcified atherosclerotic plaque in the proximal left internal carotid artery without hemodynamically significant stenosis or occlusion. The remainder of the left vertebral artery is widely patent. There is no evidence of dissection. Skeleton: There is multilevel degenerative change of the cervical spine, most advanced at C5-C6. There is no acute osseous abnormality or aggressive osseous lesion. Other neck: The soft tissues are unremarkable. Multiple dental caries are noted. Upper chest: There are multiple nodular opacities scattered throughout both lung apices with individual nodules measuring up to 7 mm. Review of the MIP images confirms  the above findings CTA HEAD FINDINGS Anterior circulation: There is calcification of the bilateral cavernous ICAs without hemodynamically significant stenosis or occlusion. The bilateral MCAs and ACAs are patent. There is no aneurysm. Posterior circulation: The V4 segments of the vertebral arteries are patent. The basilar artery is patent. The bilateral PCAs are patent. Venous sinuses: Suboptimally assessed due to contrast timing. Anatomic variants: None. Review of the MIP images confirms the above findings IMPRESSION: 1. Linear filling defect in the distal right common carotid artery extending to the proximal internal carotid artery suspicious for acute dissection, with mixing artifact considered less likely though not entirely excluded. Short follow up may be considered  to assess for persistence. There is less than 50% narrowing of the true lumen of the common and internal carotid arteries. This result was called by telephone at the time of interpretation on 01/13/2021 at 3:11 pm to provider [facility] , who verbally acknowledged these results. 2. Scattered atherosclerotic disease throughout the remainder of the vasculature of the head and neck with no other significant stenosis, occlusion, dissection, or aneurysm. 3. Scattered nodular opacities throughout both lung apices most suggestive of multifocal infectious/inflammatory etiology. Recommend dedicated imaging of the chest or follow-up CT in 1-3 months to assess for resolution. Electronically Signed   By: Lesia HausenPeter  Noone M.D.   On: 01/13/2021 15:12    12-lead ECG SR All prior EKG's in EPIC reviewed with no documented atrial fibrillation  Telemetry SR, occ PVCs  Assessment and Plan:  1. Cryptogenic stroke The patient presents with cryptogenic stroke.  I spoke at length with the patient about monitoring for afib with either a 30 day event monitor or an implantable loop recorder.  Risks, benefits, and alteratives to implantable loop recorder were  discussed with the patient and his daughter at bedside today.   At this time, the patient is very clear in their decision to proceed with implantable loop recorder.   Wound care was reviewed with the patient (keep incision clean and dry for 3 days).  Wound check follow up will be scheduled for the patient.  Please call with questions.   Renee Norberto SorensonLynn Ursuy, PA-C 01/15/2021    I have seen, examined the patient, and reviewed the above assessment and plan.  Changes to above are made where necessary.  On exam, RRR.  I agree with Dr Pearlean BrownieSethi that ILR is indicated to evaluate for Afib as a possible cause of stroke. Risks and benefits to ILR were discussed at length with the patient by me.  He wishes to proceed.  Co Sign: Hillis RangeJames Gretel Cantu, MD 01/15/2021 4:06 PM

## 2021-01-15 NOTE — Progress Notes (Signed)
OT Cancellation Note  Patient Details Name: Samuel Mata MRN: [id number] DOB: 01-25-1931   Cancelled Treatment:    Reason Eval/Treat Not Completed: OT screened, no needs identified, will sign off.  Patient dressed and waiting for transport to d/c home.  Defer OT eval to [facility] if needed.    Karin Griffith D Ravan Schlemmer 01/15/2021, 3:31 PM 01/15/2021  RP, OTR/L  Acute Rehabilitation Services  Office:  [phone]

## 2021-01-15 NOTE — Interval H&P Note (Signed)
History and Physical Interval Note:  01/15/2021 4:07 PM  Samuel Mata  has presented today for surgery, with the diagnosis of stroke.  The various methods of treatment have been discussed with the patient and family. After consideration of risks, benefits and other options for treatment, the patient has consented to  Procedure(s): LOOP RECORDER INSERTION (N/A) as a surgical intervention.  The patient's history has been reviewed, patient examined, no change in status, stable for surgery.  I have reviewed the patient's chart and labs.  Questions were answered to the patient's satisfaction.     Hillis Range

## 2021-01-15 NOTE — H&P (View-Only) (Signed)
ELECTROPHYSIOLOGY CONSULT NOTE  Patient ID: Samuel Mata MRN: [id number], DOB/AGE: 12/06/1930   Admit date: 01/13/2021 Date of Consult: 01/15/2021  Primary Physician: [facility] Primary Cardiologist: none Reason for Consultation: Cryptogenic stroke; recommendations regarding Implantable Loop Recorder, requested by Dr. Pearlean Brownie  History of Present Illness Samuel Mata was admitted on 01/13/2021 with speech difficulties, found with stroke >> treated with TNK .    PMHx includes: HTN, prior stroke  Neurology notes: Punctate acute infarct in the high left frontoparietal perirolandic cortex.  he has undergone workup for stroke including echocardiogram and carotid angios.  The patient has been monitored on telemetry which has demonstrated sinus rhythm with no arrhythmias.    Neurology has deferred TEE   Echocardiogram this admission demonstrated   IMPRESSIONS   1. Left ventricular ejection fraction, by estimation, is 60 to 65%. The  left ventricle has normal function. The left ventricle has no regional  wall motion abnormalities. Left ventricular diastolic parameters were  normal.   2. Right ventricular systolic function is normal. The right ventricular  size is normal. There is normal pulmonary artery systolic pressure.   3. 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 No  evidence of mitral stenosis. There is mild holosystolic prolapse of  posterior leaflet of the mitral valve.   4. The aortic valve is tricuspid. Aortic valve regurgitation is not  visualized. No aortic stenosis is present.   5. The inferior vena cava is normal in size with greater than 50%  respiratory variability, suggesting right atrial pressure of 3 mmHg.   Comparison(s): No prior Echocardiogram.    Lab work is reviewed.   Prior to admission, the patient denies chest pain, shortness of breath, dizziness, palpitations, or syncope.  They are recovering from  their stroke with plans to home today at discharge.      Past Medical History:  Diagnosis Date   Enlarged prostate    Hypertension    Renal stones      Surgical History:  Past Surgical History:  Procedure Laterality Date   APPENDECTOMY     CHOLECYSTECTOMY     NOSE SURGERY     SPLENECTOMY, TOTAL       Medications Prior to Admission  Medication Sig Dispense Refill Last Dose   acetaminophen (TYLENOL) 325 MG tablet Take 325-650 mg by mouth every 6 (six) hours as needed for moderate pain.    Past Week   ibuprofen (ADVIL) 200 MG tablet Take 200 mg by mouth every 6 (six) hours as needed for mild pain.   Past Week   Naphazoline HCl (CLEAR EYES OP) Place 1 drop into both eyes at bedtime.   Past Week   sodium chloride (OCEAN) 0.65 % nasal spray Place 1 spray into the nose daily as needed for congestion.    01/13/2021   Respiratory Therapy Supplies (FLUTTER) DEVI 1 puff by Does not apply route in the morning and at bedtime. (Patient not taking: Reported on 01/13/2021) 1 each 0 Not Taking   sodium chloride HYPERTONIC 3 % nebulizer solution Take by nebulization as needed for other. (Patient not taking: Reported on 01/13/2021) 750 mL 12 Not Taking    Inpatient Medications:   aspirin EC  81 mg Oral Daily   atorvastatin  40 mg Oral Daily   Chlorhexidine Gluconate Cloth  6 each Topical Daily   clopidogrel  75 mg Oral Daily   melatonin  3 mg Oral QHS    Allergies:  Allergies  Allergen Reactions   Codeine     unknown    Social History   Socioeconomic History   Marital status: Married    Spouse name: Not on file   Number of children: Not on file   Years of education: Not on file   Highest education level: Not on file  Occupational History   Not on file  Tobacco Use   Smoking status: Former    Packs/day: 1.00    Years: 30.00    Pack years: 30.00    Types: Cigarettes    Quit date: 10/07/1984    Years since quitting: 36.2   Smokeless tobacco: Never  Substance and Sexual  Activity   Alcohol use: Never   Drug use: Never   Sexual activity: Not on file  Other Topics Concern   Not on file  Social History Narrative   Not on file   Social Determinants of Health   Financial Resource Strain: Not on file  Food Insecurity: Not on file  Transportation Needs: Not on file  Physical Activity: Not on file  Stress: Not on file  Social Connections: Not on file  Intimate Partner Violence: Not on file     Family History  Problem Relation Age of Onset   Lung disease Neg Hx       Review of Systems: All other systems reviewed and are otherwise negative except as noted above.  Physical Exam: Vitals:   01/14/21 1700 01/14/21 1810 01/14/21 1933 01/15/21 0755  BP: 109/61 134/86 (!) 142/78 (!) 154/90  Pulse: (!) 51 61 77 70  Resp: 18 19 18 18   Temp:  98.9 F (37.2 C) 98 F (36.7 C) 98.1 F (36.7 C)  TempSrc:  Oral Oral Oral  SpO2: 91% 100% 100% 95%  Weight:        GEN- The patient is well appearing, alert and oriented x 3 today.   Head- normocephalic, atraumatic Eyes-  Sclera clear, conjunctiva pink Ears- hearing intact Oropharynx- clear Neck- supple Lungs- CTA b/l, normal work of breathing Heart- RRR, no murmurs, rubs or gallops  GI- soft, NT, ND Extremities- no clubbing, cyanosis, or edema MS- no significant deformity or atrophy Skin- no rash or lesion Psych- euthymic mood, full affect   Labs:   Lab Results  Component Value Date   WBC 7.3 01/13/2021   HGB 15.0 01/13/2021   HCT 44.0 01/13/2021   MCV 93.2 01/13/2021   PLT 379 01/13/2021    Recent Labs  Lab 01/13/21 1415 01/13/21 1422  NA 134* 135  K 3.8 3.9  CL 98 97*  CO2 28  --   BUN 11 14  CREATININE 0.83 0.70  CALCIUM 9.4  --   PROT 7.1  --   BILITOT 0.9  --   ALKPHOS 55  --   ALT 11  --   AST 24  --   GLUCOSE 110* 107*   No results found for: CKTOTAL, CKMB, CKMBINDEX, TROPONINI Lab Results  Component Value Date   CHOL 176 01/14/2021   Lab Results  Component Value  Date   HDL 47 01/14/2021   Lab Results  Component Value Date   LDLCALC 121 (H) 01/14/2021   Lab Results  Component Value Date   TRIG 40 01/14/2021   Lab Results  Component Value Date   CHOLHDL 3.7 01/14/2021   No results found for: LDLDIRECT  No results found for: DDIMER   Radiology/Studies:  CT HEAD WO CONTRAST (01/16/2021) Result Date: 01/13/2021 CLINICAL DATA:  Neuro  deficit, acute, stroke suspected post TNK EXAM: CT HEAD WITHOUT CONTRAST TECHNIQUE: Contiguous axial images were obtained from the base of the skull through the vertex without intravenous contrast. COMPARISON:  CT head 01/13/2021 2:35 p.m. BRAIN: BRAIN Cerebral ventricle sizes are concordant with the degree of cerebral volume loss. Patchy and confluent areas of decreased attenuation are noted throughout the deep and periventricular white matter of the cerebral hemispheres bilaterally, compatible with chronic microvascular ischemic disease. Chronic lacunar infarction left cerebellum. No evidence of large-territorial acute infarction. No parenchymal hemorrhage. No mass lesion. No extra-axial collection. No mass effect or midline shift. No hydrocephalus. Basilar cisterns are patent. Vascular: No hyperdense vessel. Atherosclerotic calcifications are present within the cavernous internal carotid arteries. Skull: No acute fracture or focal lesion. Sinuses/Orbits: Polypoid mucosal thickening of the left maxillary sinus and left ethmoid sinus. Paranasal sinuses and mastoid air cells are clear. The orbits are unremarkable. Other: None. IMPRESSION: No acute intracranial abnormality. Electronically Signed   By: Morgane  Naveau M.D.   On: 01/13/2021 21:35   MR BRAIN WO CONTRAST Result Date: 01/14/2021 CLINICAL DATA:  Neuro deficit, acute, stroke suspected EXAM: MRI HEAD WITHOUT CONTRAST TECHNIQUE: Multiplanar, multiecho pulse sequences of the brain and surrounding structures were obtained without intravenous contrast. COMPARISON:  CT head  01/13/2021. FINDINGS: Brain: Punctate acute infarct in the high left frontoparietal perirolandic cortex (series 2, image 45). No significant edema or mass effect. Mild-to-moderate for age T2 hyperintensities in the periventricular and pontine white matter, nonspecific but compatible with chronic microvascular ischemic disease. Mild to moderate for age atrophy. Chronic infarct in the left cerebellum. No evidence of acute hemorrhage, hydrocephalus, mass lesion, or extra-axial fluid collection. Focus of susceptibility artifact with associated round 5 mm T2 hyperintensity in the high posterior left parietal lobe (series 5, image 19; series 7, image 25), compatible with prior hemorrhage in possible cavernous malformation. No associated edema. Vascular: Major arterial flow voids are maintained at the skull base. Skull and upper cervical spine: Normal marrow signal. Sinuses/Orbits: Mild to moderate paranasal sinus mucosal thickening. No acute orbital findings. Other: No sizable mastoid effusions. IMPRESSION: 1. Punctate acute infarct in the high left frontoparietal perirolandic cortex. 2. Mild to moderate for age chronic microvascular ischemic disease, atrophy, and chronic left cerebellar infarct. 3. 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 Electronically Signed   By: Frederick S Jones M.D.   On: 01/14/2021 14:42   CT HEAD CODE STROKE WO CONTRAST Result Date: 01/13/2021 CLINICAL DATA:  Code stroke. EXAM: CT HEAD WITHOUT CONTRAST TECHNIQUE: Contiguous axial images were obtained from the base of the skull through the vertex without intravenous contrast. COMPARISON:  Brain MRI 03/06/2020 FINDINGS: Brain: There is no evidence of acute intracranial hemorrhage, extra-axial fluid collection, or infarct. There are remote lacunar infarcts in the left basal ganglia and left cerebellar hemisphere; the cerebellar hemisphere infarct is new since 03/06/2020. There is mild  parenchymal volume loss with prominence of the ventricular system, unchanged. Foci of hypodensity in the subcortical and periventricular white matter likely reflects sequela of chronic white matter microangiopathy, grossly similar to the prior MRI. There is no mass lesion. There is no midline shift. Vascular: There is calcification of the bilateral cavernous ICAs. Skull: Normal. Negative for fracture or focal lesion. Sinuses/Orbits: There is mild mucosal thickening in the ethmoid air cells. There is a polypoid lesion originating in the left ethmoid air cells extending into the left maxillary sinus not present on the prior MRI. The globes   and orbits are unremarkable. Other: None. ASPECTS [facility] Stroke Program Early CT Score) - Ganglionic level infarction (caudate, lentiform nuclei, internal capsule, insula, M1-M3 cortex): 7 - Supraganglionic infarction (M4-M6 cortex): 3 Total score (0-10 with 10 being normal): 10 IMPRESSION: 1. No acute intracranial hemorrhage or infarct. This result was paged via AMION at the time of interpretation on 01/13/2021 at 2:33 pm to provider Dr Selina Cooley, who verbally acknowledged these results. 2. ASPECTS is 10 3. Small remote lacunar infarct in the left cerebellar hemisphere, new since 03/07/2019. 4. Polypoid lesion in the left ethmoid air cells extending into the maxillary sinus as above is new since 03/06/2020. This most likely reflects a benign polyp; however, recommend nonemergent correlation with direct visualization. Electronically Signed   By: Lesia Hausen M.D.   On: 01/13/2021 14:35   CT ANGIO HEAD NECK W WO CM W PERF (CODE STROKE) Result Date: 01/13/2021 CLINICAL DATA:  Neuro deficit EXAM: CT ANGIOGRAPHY HEAD AND NECK TECHNIQUE: Multidetector CT imaging of the head and neck was performed using the standard protocol during bolus administration of intravenous contrast. Multiplanar CT image reconstructions and MIPs were obtained to evaluate the vascular anatomy. Carotid stenosis  measurements (when applicable) are obtained utilizing NASCET criteria, using the distal internal carotid diameter as the denominator. CONTRAST:  OMNIPAQUE IOHEXOL 350 MG/ML SOLN COMPARISON:  Same-day noncontrast CT head, brain MRI 03/06/2020 FINDINGS: CTA NECK FINDINGS Aortic arch: Standard branching. Imaged portion shows no evidence of aneurysm or dissection. There is mild calcification of the aortic arch and proximal great vessels. No significant stenosis of the major arch vessel origins. Right carotid system: There is a linear filling defect in the distal common carotid artery extending into the internal carotid artery consistent with a dissection flap. There is less than 50% narrowing of the true lumen. There is mild soft and calcified atherosclerotic plaque of the proximal right internal carotid artery without hemodynamically significant stenosis. Left carotid system: There is mild soft and calcified atherosclerotic plaque at the left carotid bulb without hemodynamically significant stenosis or occlusion. There is no dissection or aneurysm. Vertebral arteries: There is focal soft and calcified atherosclerotic plaque at the origin of the right vertebral artery resulting in approximately 50% narrowing. The remainder of the right vertebral artery is widely patent. There is calcified atherosclerotic plaque in the proximal left internal carotid artery without hemodynamically significant stenosis or occlusion. The remainder of the left vertebral artery is widely patent. There is no evidence of dissection. Skeleton: There is multilevel degenerative change of the cervical spine, most advanced at C5-C6. There is no acute osseous abnormality or aggressive osseous lesion. Other neck: The soft tissues are unremarkable. Multiple dental caries are noted. Upper chest: There are multiple nodular opacities scattered throughout both lung apices with individual nodules measuring up to 7 mm. Review of the MIP images confirms  the above findings CTA HEAD FINDINGS Anterior circulation: There is calcification of the bilateral cavernous ICAs without hemodynamically significant stenosis or occlusion. The bilateral MCAs and ACAs are patent. There is no aneurysm. Posterior circulation: The V4 segments of the vertebral arteries are patent. The basilar artery is patent. The bilateral PCAs are patent. Venous sinuses: Suboptimally assessed due to contrast timing. Anatomic variants: None. Review of the MIP images confirms the above findings IMPRESSION: 1. Linear filling defect in the distal right common carotid artery extending to the proximal internal carotid artery suspicious for acute dissection, with mixing artifact considered less likely though not entirely excluded. Short follow up may be considered  to assess for persistence. There is less than 50% narrowing of the true lumen of the common and internal carotid arteries. This result was called by telephone at the time of interpretation on 01/13/2021 at 3:11 pm to provider [facility] , who verbally acknowledged these results. 2. Scattered atherosclerotic disease throughout the remainder of the vasculature of the head and neck with no other significant stenosis, occlusion, dissection, or aneurysm. 3. Scattered nodular opacities throughout both lung apices most suggestive of multifocal infectious/inflammatory etiology. Recommend dedicated imaging of the chest or follow-up CT in 1-3 months to assess for resolution. Electronically Signed   By: Lesia HausenPeter  Noone M.D.   On: 01/13/2021 15:12    12-lead ECG SR All prior EKG's in EPIC reviewed with no documented atrial fibrillation  Telemetry SR, occ PVCs  Assessment and Plan:  1. Cryptogenic stroke The patient presents with cryptogenic stroke.  I spoke at length with the patient about monitoring for afib with either a 30 day event monitor or an implantable loop recorder.  Risks, benefits, and alteratives to implantable loop recorder were  discussed with the patient and his daughter at bedside today.   At this time, the patient is very clear in their decision to proceed with implantable loop recorder.   Wound care was reviewed with the patient (keep incision clean and dry for 3 days).  Wound check follow up will be scheduled for the patient.  Please call with questions.   Renee Norberto SorensonLynn Ursuy, PA-C 01/15/2021    I have seen, examined the patient, and reviewed the above assessment and plan.  Changes to above are made where necessary.  On exam, RRR.  I agree with Dr Pearlean BrownieSethi that ILR is indicated to evaluate for Afib as a possible cause of stroke. Risks and benefits to ILR were discussed at length with the patient by me.  He wishes to proceed.  Co Sign: Hillis RangeJames Vianny Schraeder, MD 01/15/2021 4:06 PM

## 2021-01-15 NOTE — Progress Notes (Signed)
Pt is being discharged from the unit via wheelchair. All belongings has been sent home. AVS has been given and reviewed.

## 2021-01-15 NOTE — Discharge Instructions (Signed)
Post procedure wound care instructions (heart monitor) Keep incision clean and dry for 3 days. You can remove outer dressing tomorrow. Leave steri-strips (little pieces of tape) on until seen in the office for wound check appointment. Call the office ([phone]) for redness, drainage, swelling, or fever.  

## 2021-01-15 NOTE — Progress Notes (Signed)
Pt refused their tele box. Educated both pt and daughter at bedside about the importance of the tele monitoring after having a stroke. Pt still refused to wear box, Dr. Derry Lory made aware of pt refusal.

## 2021-01-15 NOTE — Discharge Summary (Addendum)
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   aspirin 81 MG EC tablet Take 1 tablet (81 mg total) by mouth daily. Swallow whole.   atorvastatin 40 MG tablet Commonly known as: LIPITOR Take 1 tablet (40 mg total) by mouth daily.   CLEAR EYES OP Place 1 drop into both eyes at bedtime.   clopidogrel 75 MG tablet Commonly known as: PLAVIX Take 1 tablet (75 mg total) by mouth daily. Start taking on: January 16, 2021   ibuprofen 200 MG tablet Commonly known as: ADVIL Take 200 mg by mouth every 6 (six) hours as needed for mild pain.   sodium chloride 0.65 % nasal spray Commonly known as: OCEAN Place 1 spray into the nose daily as needed for congestion.        LABORATORY STUDIES CBC    Component Value Date/Time   WBC 7.3 01/13/2021 1415   RBC 4.83 01/13/2021 1415   HGB 15.0 01/13/2021 1422   HCT 44.0 01/13/2021 1422   PLT 379 01/13/2021 1415   MCV 93.2 01/13/2021 1415   MCH 31.7 01/13/2021 1415   MCHC 34.0 01/13/2021 1415   RDW 11.8 01/13/2021 1415   LYMPHSABS 2.0 01/13/2021 1415   MONOABS  1.2 (H) 01/13/2021 1415   EOSABS 0.3 01/13/2021 1415   BASOSABS 0.0 01/13/2021 1415   CMP    Component Value Date/Time   NA 135 01/13/2021 1422   K 3.9 01/13/2021 1422   CL 97 (L) 01/13/2021 1422   CO2 28 01/13/2021 1415   GLUCOSE 107 (H) 01/13/2021 1422   BUN 14 01/13/2021 1422   CREATININE 0.70 01/13/2021 1422   CREATININE 1.03 08/22/2020 1443   CALCIUM 9.4 01/13/2021 1415   PROT 7.1 01/13/2021 1415   ALBUMIN 3.4 (L) 01/13/2021 1415   AST 24 01/13/2021 1415   ALT 11 01/13/2021 1415   ALKPHOS 55 01/13/2021 1415   BILITOT 0.9 01/13/2021 1415   GFRNONAA >60 01/13/2021 1415   GFRAA >60 09/14/2019 1922   COAGS Lab Results  Component Value Date   INR 1.1 01/13/2021   INR 1.1 09/14/2019   Lipid Panel    Component Value Date/Time   CHOL 176 01/14/2021 0237   TRIG 40 01/14/2021 0237   HDL 47 01/14/2021 0237   CHOLHDL 3.7 01/14/2021 0237   VLDL 8 01/14/2021 0237   LDLCALC 121 (H) 01/14/2021 0237   HgbA1C  Lab Results  Component Value Date   HGBA1C 5.5 01/14/2021   Urinalysis    Component Value Date/Time  COLORURINE YELLOW 02/20/2020 1600   APPEARANCEUR CLEAR 02/20/2020 1600   LABSPEC 1.014 02/20/2020 1600   PHURINE 6.0 02/20/2020 1600   GLUCOSEU NEGATIVE 02/20/2020 1600   HGBUR NEGATIVE 02/20/2020 1600   BILIRUBINUR NEGATIVE 02/20/2020 1600   KETONESUR 5 (A) 02/20/2020 1600   PROTEINUR NEGATIVE 02/20/2020 1600   NITRITE NEGATIVE 02/20/2020 1600   LEUKOCYTESUR NEGATIVE 02/20/2020 1600   Urine Drug Screen No results found for: LABOPIA, COCAINSCRNUR, LABBENZ, AMPHETMU, THCU, LABBARB  Alcohol Level No results found for: ETH   SIGNIFICANT DIAGNOSTIC STUDIES CT HEAD WO CONTRAST ( )  Result Date: 01/13/2021 CLINICAL DATA:  Neuro deficit, acute, stroke suspected post TNK EXAM: CT HEAD WITHOUT CONTRAST TECHNIQUE: Contiguous axial images were obtained from the base of the skull through the vertex without intravenous contrast. COMPARISON:  CT head 01/13/2021 2:35  p.m. BRAIN: BRAIN Cerebral ventricle sizes are concordant with the degree of cerebral volume loss. Patchy and confluent areas of decreased attenuation are noted throughout the deep and periventricular white matter of the cerebral hemispheres bilaterally, compatible with chronic microvascular ischemic disease. Chronic lacunar infarction left cerebellum. No evidence of large-territorial acute infarction. No parenchymal hemorrhage. No mass lesion. No extra-axial collection. No mass effect or midline shift. No hydrocephalus. Basilar cisterns are patent. Vascular: No hyperdense vessel. Atherosclerotic calcifications are present within the cavernous internal carotid arteries. Skull: No acute fracture or focal lesion. Sinuses/Orbits: Polypoid mucosal thickening of the left maxillary sinus and left ethmoid sinus. Paranasal sinuses and mastoid air cells are clear. The orbits are unremarkable. Other: None. IMPRESSION: No acute intracranial abnormality. Electronically Signed   By: Tish Frederickson M.D.   On: 01/13/2021 21:35   MR BRAIN WO CONTRAST  Result Date: 01/14/2021 CLINICAL DATA:  Neuro deficit, acute, stroke suspected EXAM: MRI HEAD WITHOUT CONTRAST TECHNIQUE: Multiplanar, multiecho pulse sequences of the brain and surrounding structures were obtained without intravenous contrast. COMPARISON:  CT head 01/13/2021. FINDINGS: Brain: Punctate acute infarct in the high left frontoparietal perirolandic cortex (series 2, image 45). No significant edema or mass effect. Mild-to-moderate for age T2 hyperintensities in the periventricular and pontine white matter, nonspecific but compatible with chronic microvascular ischemic disease. Mild to moderate for age atrophy. Chronic infarct in the left cerebellum. No evidence of acute hemorrhage, hydrocephalus, mass lesion, or extra-axial fluid collection. Focus of susceptibility artifact with associated round 5 mm T2 hyperintensity in the high posterior left parietal lobe (series 5,  image 19; series 7, image 25), compatible with prior hemorrhage in possible cavernous malformation. No associated edema. Vascular: Major arterial flow voids are maintained at the skull base. Skull and upper cervical spine: Normal marrow signal. Sinuses/Orbits: Mild to moderate paranasal sinus mucosal thickening. No acute orbital findings. Other: No sizable mastoid effusions. IMPRESSION: 1. Punctate acute infarct in the high left frontoparietal perirolandic cortex. 2. Mild to moderate for age chronic microvascular ischemic disease, atrophy, and chronic left cerebellar infarct. 3. 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 Electronically Signed   By: Feliberto Harts M.D.   On: 01/14/2021 14:42   ECHOCARDIOGRAM COMPLETE  Result Date: 01/14/2021    ECHOCARDIOGRAM REPORT   Patient Name:   BIFF RUTIGLIANO Date of Exam: 01/14/2021 Medical Rec #:  [id number]     Height:       67.0 in Accession #:    [id number]    Weight:       131.0 lb Date of Birth:  December 22, 1930    BSA:  1.689 m Patient Age:    35 years      BP:           135/69 mmHg Patient Gender: M             HR:           61 bpm. Exam Location:  Inpatient Procedure: 2D Echo, Cardiac Doppler and Color Doppler Indications:    Stroke  History:        Patient has no prior history of Echocardiogram examinations.                 Risk Factors:Hypertension.  Sonographer:    Eulah Pont RDCS Referring Phys: [id number] Malachi Carl STACK IMPRESSIONS  1. Left ventricular ejection fraction, by estimation, is 60 to 65%. The left ventricle has normal function. The left ventricle has no regional wall motion abnormalities. Left ventricular diastolic parameters were normal.  2. Right ventricular systolic function is normal. The right ventricular size is normal. There is normal pulmonary artery systolic pressure.  3. 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 No evidence of mitral stenosis. There is  mild holosystolic prolapse of posterior leaflet of the mitral valve.  4. The aortic valve is tricuspid. Aortic valve regurgitation is not visualized. No aortic stenosis is present.  5. The inferior vena cava is normal in size with greater than 50% respiratory variability, suggesting right atrial pressure of 3 mmHg. Comparison(s): No prior Echocardiogram. Conclusion(s)/Recommendation(s): Normal biventricular function without evidence of hemodynamically significant valvular heart disease. No intracardiac source of embolism detected on this transthoracic study. A transesophageal echocardiogram is recommended to exclude cardiac source of embolism if clinically indicated. FINDINGS  Left Ventricle: Left ventricular ejection fraction, by estimation, is 60 to 65%. The left ventricle has normal function. The left ventricle has no regional wall motion abnormalities. The left ventricular internal cavity size was normal in size. There is  no left ventricular hypertrophy. Left ventricular diastolic parameters were normal. Right Ventricle: The right ventricular size is normal. No increase in right ventricular wall thickness. Right ventricular systolic function is normal. There is normal pulmonary artery systolic pressure. The tricuspid regurgitant velocity is 2.07 m/s, and  with an assumed right atrial pressure of 3 mmHg, the estimated right ventricular systolic pressure is 20.1 mmHg. Left Atrium: Left atrial size was normal in size. Right Atrium: Right atrial size was normal in size. Pericardium: There is no evidence of pericardial effusion. 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 Mild mitral valve regurgitation. No evidence of mitral valve stenosis. Tricuspid Valve: The tricuspid valve is normal in structure. Tricuspid valve regurgitation is trivial. No evidence of tricuspid stenosis. Aortic Valve: The aortic valve is tricuspid. Aortic valve  regurgitation is not visualized. No aortic stenosis is present. Pulmonic Valve: The pulmonic valve was grossly normal. Pulmonic valve regurgitation is trivial. No evidence of pulmonic stenosis. Aorta: The aortic root, ascending aorta, aortic arch and descending aorta are all structurally normal, with no evidence of dilitation or obstruction. Venous: The inferior vena cava is normal in size with greater than 50% respiratory variability, suggesting right atrial pressure of 3 mmHg. IAS/Shunts: No atrial level shunt detected by color flow Doppler.  LEFT VENTRICLE PLAX 2D LVIDd:         5.30 cm  Diastology LVIDs:         3.50 cm  LV e' medial:    4.95 cm/s LV PW:  1.00 cm  LV E/e' medial:  12.8 LV IVS:        0.80 cm  LV e' lateral:   8.78 cm/s LVOT diam:     2.10 cm  LV E/e' lateral: 7.2 LV SV:         77 LV SV Index:   46 LVOT Area:     3.46 cm  RIGHT VENTRICLE RV S prime:     12.00 cm/s TAPSE (M-mode): 2.0 cm LEFT ATRIUM             Index       RIGHT ATRIUM           Index LA diam:        2.10 cm 1.24 cm/m  RA Area:     11.30 cm LA Vol (A2C):   23.5 ml 13.91 ml/m RA Volume:   27.00 ml  15.98 ml/m LA Vol (A4C):   22.9 ml 13.56 ml/m LA Biplane Vol: 25.0 ml 14.80 ml/m  AORTIC VALVE LVOT Vmax:   120.00 cm/s LVOT Vmean:  70.600 cm/s LVOT VTI:    0.222 m  AORTA Ao Root diam: 3.60 cm Ao Asc diam:  3.30 cm MITRAL VALVE               TRICUSPID VALVE MV Area (PHT): 2.83 cm    TR Peak grad:   17.1 mmHg MV Decel Time: 268 msec    TR Vmax:        207.00 cm/s MV E velocity: 63.40 cm/s MV A velocity: 89.20 cm/s  SHUNTS MV E/A ratio:  0.71        Systemic VTI:  0.22 m                            Systemic Diam: 2.10 cm Jodelle Red MD Electronically signed by Jodelle Red MD Signature Date/Time: 01/14/2021/1:58:08 PM    Final    CT HEAD CODE STROKE WO CONTRAST  Result Date: 01/13/2021 CLINICAL DATA:  Code stroke. EXAM: CT HEAD WITHOUT CONTRAST TECHNIQUE: Contiguous axial images were obtained from the  base of the skull through the vertex without intravenous contrast. COMPARISON:  Brain MRI 03/06/2020 FINDINGS: Brain: There is no evidence of acute intracranial hemorrhage, extra-axial fluid collection, or infarct. There are remote lacunar infarcts in the left basal ganglia and left cerebellar hemisphere; the cerebellar hemisphere infarct is new since 03/06/2020. There is mild parenchymal volume loss with prominence of the ventricular system, unchanged. Foci of hypodensity in the subcortical and periventricular white matter likely reflects sequela of chronic white matter microangiopathy, grossly similar to the prior MRI. There is no mass lesion. There is no midline shift. Vascular: There is calcification of the bilateral cavernous ICAs. Skull: Normal. Negative for fracture or focal lesion. Sinuses/Orbits: There is mild mucosal thickening in the ethmoid air cells. There is a polypoid lesion originating in the left ethmoid air cells extending into the left maxillary sinus not present on the prior MRI. The globes and orbits are unremarkable. Other: None. ASPECTS [facility] Stroke Program Early CT Score) - Ganglionic level infarction (caudate, lentiform nuclei, internal capsule, insula, M1-M3 cortex): 7 - Supraganglionic infarction (M4-M6 cortex): 3 Total score (0-10 with 10 being normal): 10 IMPRESSION: 1. No acute intracranial hemorrhage or infarct. This result was paged via AMION at the time of interpretation on 01/13/2021 at 2:33 pm to provider Dr Selina Cooley, who verbally acknowledged these results. 2. ASPECTS is 10 3. Small remote lacunar  infarct in the left cerebellar hemisphere, new since 03/07/2019. 4. Polypoid lesion in the left ethmoid air cells extending into the maxillary sinus as above is new since 03/06/2020. This most likely reflects a benign polyp; however, recommend nonemergent correlation with direct visualization. Electronically Signed   By: Lesia Hausen M.D.   On: 01/13/2021 14:35   CT ANGIO HEAD NECK W WO  CM W PERF (CODE STROKE)  Result Date: 01/13/2021 CLINICAL DATA:  Neuro deficit EXAM: CT ANGIOGRAPHY HEAD AND NECK TECHNIQUE: Multidetector CT imaging of the head and neck was performed using the standard protocol during bolus administration of intravenous contrast. Multiplanar CT image reconstructions and MIPs were obtained to evaluate the vascular anatomy. Carotid stenosis measurements (when applicable) are obtained utilizing NASCET criteria, using the distal internal carotid diameter as the denominator. CONTRAST:  OMNIPAQUE IOHEXOL 350 MG/ML SOLN COMPARISON:  Same-day noncontrast CT head, brain MRI 03/06/2020 FINDINGS: CTA NECK FINDINGS Aortic arch: Standard branching. Imaged portion shows no evidence of aneurysm or dissection. There is mild calcification of the aortic arch and proximal great vessels. No significant stenosis of the major arch vessel origins. Right carotid system: There is a linear filling defect in the distal common carotid artery extending into the internal carotid artery consistent with a dissection flap. There is less than 50% narrowing of the true lumen. There is mild soft and calcified atherosclerotic plaque of the proximal right internal carotid artery without hemodynamically significant stenosis. Left carotid system: There is mild soft and calcified atherosclerotic plaque at the left carotid bulb without hemodynamically significant stenosis or occlusion. There is no dissection or aneurysm. Vertebral arteries: There is focal soft and calcified atherosclerotic plaque at the origin of the right vertebral artery resulting in approximately 50% narrowing. The remainder of the right vertebral artery is widely patent. There is calcified atherosclerotic plaque in the proximal left internal carotid artery without hemodynamically significant stenosis or occlusion. The remainder of the left vertebral artery is widely patent. There is no evidence of dissection. Skeleton: There is multilevel  degenerative change of the cervical spine, most advanced at C5-C6. There is no acute osseous abnormality or aggressive osseous lesion. Other neck: The soft tissues are unremarkable. Multiple dental caries are noted. Upper chest: There are multiple nodular opacities scattered throughout both lung apices with individual nodules measuring up to 7 mm. Review of the MIP images confirms the above findings CTA HEAD FINDINGS Anterior circulation: There is calcification of the bilateral cavernous ICAs without hemodynamically significant stenosis or occlusion. The bilateral MCAs and ACAs are patent. There is no aneurysm. Posterior circulation: The V4 segments of the vertebral arteries are patent. The basilar artery is patent. The bilateral PCAs are patent. Venous sinuses: Suboptimally assessed due to contrast timing. Anatomic variants: None. Review of the MIP images confirms the above findings IMPRESSION: 1. Linear filling defect in the distal right common carotid artery extending to the proximal internal carotid artery suspicious for acute dissection, with mixing artifact considered less likely though not entirely excluded. Short follow up may be considered to assess for persistence. There is less than 50% narrowing of the true lumen of the common and internal carotid arteries. This result was called by telephone at the time of interpretation on 01/13/2021 at 3:11 pm to provider [facility] , who verbally acknowledged these results. 2. Scattered atherosclerotic disease throughout the remainder of the vasculature of the head and neck with no other significant stenosis, occlusion, dissection, or aneurysm. 3. Scattered nodular opacities throughout both lung apices most  suggestive of multifocal infectious/inflammatory etiology. Recommend dedicated imaging of the chest or follow-up CT in 1-3 months to assess for resolution. Electronically Signed   By: Lesia HausenPeter  Noone M.D.   On: 01/13/2021 15:12      HISTORY OF PRESENT ILLNESS    87 year old gentleman with a history of hypertension, remote cerebellar stroke who presented by EMS for acute onset of aphasia.  Last known well was 12:00 today.  He came inside and wife noticed that he was not making sense when he spoke.  He was brought in by EMS supreme activated stroke code.  On examination he was able to answer neither question correctly.  He was able to follow commands.  He had no facial palsy.  He had no drift in any extremity.  He did not have any ataxia or sensory deficit.  He had a moderate aphasia with normal articulation and no neglect. NIHSS = 3. CT head showed NAICP and no hemorrhage. I spoke to his wife [name] by phone, reviewed inclusion/exclusion criteria and benefits/risks of TNK including potentially fatal hemorrhage. She gave informed conset to proceed. 10mg  labetalol was given to decrease his SBP <180 and TNK was administered. 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 Cervical dissection is not a contraindication to thrombolytics and therefore the TNK was not reversed. MRI brain showed acute punctate infarct at left frontoparietal perirolandic cortex as well as moderate microvascular ischemic disease.   HOSPITAL COURSE  Unremarkable. Patient was started on aspirin and plavix for 3 months given rica dissection found on neuroimaging. He will have loop recorder placed prior to discharge from hospital.     DISCHARGE EXAM Blood pressure (!) 161/90, pulse 71, temperature 97.9 F (36.6 C), temperature source Oral, resp. rate 18, weight 59.4 kg, SpO2 100 %.  Neuro: Mental Status: Patient is awake, alert, oriented to person, place, month, year, and situation.  Patient is able to give a clear and coherent history/ age appropriate forgetfulness  No signs of aphasia or neglect  Good insight into his illness.   Diminished attention, registration and recall. Cranial Nerves: II: Visual Fields are full. Pupils are equal, round, and reactive to light.    III,IV, VI: EOMI without ptosis or diploplia.  V: Facial sensation is symmetric to temperature VII: Facial movement is symmetric resting and smiling VIII: Hearing is intact to voice X: Palate elevates symmetrically XI: Shoulder shrug is symmetric. XII: Tongue protrudes midline without atrophy or fasciculations.  Motor: Tone is normal. Bulk is normal. 5/5 strength was present in all four extremities.   Sensory: Sensation is symmetric to light touch and temperature in the arms and legs. No extinction to DSS present.     Plantars: Toes are downgoing bilaterally.   Cerebellar: FNF and HKS are intact bilaterally       Discharge Diet       Diet   Diet Heart Room service appropriate? Yes; Fluid consistency: Thin   liquids  DISCHARGE PLAN [facility]  [facility].  aspirin 81 mg daily and clopidogrel 75 mg daily for secondary stroke prevention for 3 months then aspirin alone. Ongoing stroke risk factor control by Primary Care Physician at time of discharge Follow-up [facility] in 2 weeks [facility] in  8 weeks, office  to schedule an appointment.   35 minutes were spent preparing discharge.  I have personally obtained history,examined this patient, reviewed notes, independently viewed imaging studies, participated in medical decision making and plan of care.ROS completed by me personally and pertinent positives fully documented  I have made any additions or clarifications directly to the above note. Agree with note above.    Delia Heady, MD Medical Director [facility] St[facility] Center Pager: [phone] 01/15/2021 2:30 PM

## 2021-01-15 NOTE — Plan of Care (Signed)
  Problem: Safety: Goal: Ability to remain free from injury will improve Outcome: Progressing   Problem: Self-Care: Goal: Verbalization of feelings and concerns over difficulty with self-care will improve Outcome: Progressing Goal: Ability to communicate needs accurately will improve Outcome: Progressing

## 2021-01-15 NOTE — TOC Transition Note (Signed)
Transition of Care [facility]) - CM/SW Discharge Note   Patient Details  Name: Samuel Mata MRN: [id number] Date of Birth: Jan 11, 1931  [facility] of [facility] CM/SW Contact:  Kermit Balo, RN Phone Number: 01/15/2021, 10:44 AM   Clinical Narrative:    Patient is discharging home with [facility] services through [facility] Marylene Land with [facility] is aware of d/c home today.  Pt has walker at home.  Pt has transportation home.    Final next level of care: Home w Home Health Services Barriers to Discharge: No Barriers Identified   Patient Goals and CMS Choice Patient states their goals for this hospitalization and ongoing recovery are:: to go home CMS Medicare.gov Compare Post Acute Care list provided to:: Other (Comment Required) Choice offered to / list presented to : Spouse  Discharge Placement                       Discharge Plan and Services   Discharge Planning Services: CM Consult Post Acute Care Choice: Home Health          DME Arranged: N/A         HH Arranged: PT, OT HH Agency: [facility] Date [facility] Agency Contacted: 01/15/21   Representative spoke with at [facility] Agency: Marylene Land  Social Determinants of Health (SDOH) Interventions     Readmission Risk Interventions No flowsheet data found.

## 2021-01-15 NOTE — Progress Notes (Signed)
Physical Therapy Treatment Patient Details Name: Samuel Mata MRN: [id number] DOB: 1931-05-05 Today's Date: 01/15/2021    History of Present Illness 87 y/o male presented to ED on 8/30 for acute onset of aphasia. TNK given 1430 on 01/13/21. CT head negative. CTA head/neck with filling defect in distal R common carotid artery extending to proximal ICA suspicious of acute dissection. MRI revealed punctated acute infarct in the high left frontopariental perirolandic cortex. PMH: HTN, remote cerebellar stroke    PT Comments    Pt is making good improvement towards his goals.  He is more steady on his feet today compared to yesterday and his daughter reports taking him on several walks today.  We practiced gait and balance exercises today. He remains appropriate for post acute care follow up for balance and gait training.  PT will continue to follow acutely for safe mobility progression.  Bring cane next session.   Follow Up Recommendations  Home health PT;Supervision - Intermittent     Equipment Recommendations  None recommended by PT (has a walker and cane at home, daughter bought toilet raiser)    Recommendations for Other Services       Precautions / Restrictions Precautions Precautions: Fall Precaution Comments: mildly unsteady on his feet, used cane PTA    Mobility  Bed Mobility               General bed mobility comments: walking out of the bathroom upon arrival    Transfers Overall transfer level: Modified independent                  Ambulation/Gait Ambulation/Gait assistance: Supervision;Min guard Gait Distance (Feet): 300 Feet Assistive device: None Gait Pattern/deviations: Step-through pattern;Staggering left;Staggering right     General Gait Details: Pt with midly staggering gait pattern requiring min guard assist at time for slight balance checks.  Pt reports he feels 90% back to his baseline.   Stairs             Wheelchair Mobility     Modified Rankin (Stroke Patients Only) Modified Rankin (Stroke Patients Only) Pre-Morbid Rankin Score: No symptoms Modified Rankin: Moderately severe disability     Balance Overall balance assessment: Mild deficits observed, not formally tested;Needs assistance               Single Leg Stance - Right Leg: 3 (min assist) Single Leg Stance - Left Leg: 5 (min assist) Tandem Stance - Right Leg: 30 (min guard assist) Tandem Stance - Left Leg: 30 (min guard assist)                    Cognition Arousal/Alertness: Awake/alert Behavior During Therapy: WFL for tasks assessed/performed Overall Cognitive Status: Within Functional Limits for tasks assessed                                 General Comments: at times deminished comprehension due to very HOH (with hearing aids), pt reports R ear is his better ear.      Exercises General Exercises - Lower Extremity Hip Flexion/Marching: AROM;Both;10 reps;Standing Mini-Sqauts: AROM;Both;10 reps    General Comments        Pertinent Vitals/Pain Pain Assessment: No/denies pain    Home Living                      Prior Function            PT  Goals (current goals can now be found in the care plan section) Acute Rehab PT Goals Patient Stated Goal: to go home Progress towards PT goals: Progressing toward goals    Frequency    Min 4X/week      PT Plan Current plan remains appropriate    Co-evaluation              AM-PAC PT "6 Clicks" Mobility   Outcome Measure  Help needed turning from your back to your side while in a flat bed without using bedrails?: None Help needed moving from lying on your back to sitting on the side of a flat bed without using bedrails?: None Help needed moving to and from a bed to a chair (including a wheelchair)?: None Help needed standing up from a chair using your arms (e.g., wheelchair or bedside chair)?: None Help needed to walk in hospital room?: A  Little Help needed climbing 3-5 steps with a railing? : A Little 6 Click Score: 22    End of Session   Activity Tolerance: Patient tolerated treatment well Patient left: in bed;Other (comment);with family/visitor present (seated EOB) Nurse Communication: Mobility status PT Visit Diagnosis: Unsteadiness on feet (R26.81);Muscle weakness (generalized) (M62.81)     Time: 1445-1510 PT Time Calculation (min) (ACUTE ONLY): 25 min  Charges:  $Gait Training: 8-22 mins $Therapeutic Exercise: 8-22 mins                     Corinna Capra, PT, DPT  Acute Rehabilitation Ortho Tech Supervisor [phone] pager [phone]) [phone] office

## 2021-01-16 ENCOUNTER — Encounter (HOSPITAL_COMMUNITY): Payer: Self-pay | Admitting: Internal Medicine

## 2021-01-20 ENCOUNTER — Other Ambulatory Visit: Payer: Self-pay | Admitting: *Deleted

## 2021-01-20 NOTE — Patient Outreach (Signed)
[facility]) Care Management  01/20/2021  Samuel Mata 12/09/1930 [id number]   RED ON EMMI ALERT - Stroke Day # 1 Date: 9/3 Red Alert Reason: Not scheduled follow up appointment   Outreach attempt #1, successful to wife however she state they don't have time to talk as he is on his way to an MD appointment.    Plan: RN CM will send outreach letter and follow up within the next 3-4 business days.  [name], [place], MSN [facility] Care Management  [facility] Manager [phone]

## 2021-01-23 ENCOUNTER — Telehealth: Payer: Self-pay

## 2021-01-23 ENCOUNTER — Other Ambulatory Visit: Payer: Self-pay | Admitting: *Deleted

## 2021-01-23 NOTE — Patient Outreach (Signed)
[facility]) Care Management  01/23/2021  Samuel Mata [birth year] [id number]   RED ON EMMI ALERT - Stroke Day # 1 Date: 9/3 Red Alert Reason: Not scheduled follow up appointment   Outreach attempt #2, successful.  Identity verified.  This care manager introduced self and stated purpose of call.  [facility] care management services explained.    Member state he has broken his hearing aide, grants permission to speak with wife.  She state member has been doing well.  Has appointments scheduled with cardiology on 9/14, PCP on 9/16, and neurology on 10/27.  Above noted alert as it was the holiday weekend and MD offices were closed.   He has had home visit from home health nursing, PT, and OT.  He has been cleared from OT, PT will continue.  Report he has been taking medications as instructed per discharge.  Denies any urgent concerns, encouraged to contact this care manager with questions.   Plan: RN CM will follow up within the next 2 weeks.    [name], [place], MSN [facility] Care Management  [facility] Manager [phone]

## 2021-01-23 NOTE — Telephone Encounter (Signed)
I let the patient know that he can take a shower.

## 2021-01-28 ENCOUNTER — Ambulatory Visit (INDEPENDENT_AMBULATORY_CARE_PROVIDER_SITE_OTHER): Payer: Medicare HMO | Admitting: Student

## 2021-01-28 ENCOUNTER — Other Ambulatory Visit: Payer: Self-pay

## 2021-01-28 DIAGNOSIS — I639 Cerebral infarction, unspecified: Secondary | ICD-10-CM

## 2021-01-28 LAB — CUP PACEART INCLINIC DEVICE CHECK
Date Time Interrogation Session: 20220914110221
Implantable Pulse Generator Implant Date: 20220901

## 2021-01-28 NOTE — Progress Notes (Signed)
ILR wound check in clinic. Steri strips removed. Wound well healed. Home monitor transmitting nightly. No episodes. Questions answered.  

## 2021-01-30 ENCOUNTER — Other Ambulatory Visit: Payer: Self-pay | Admitting: *Deleted

## 2021-01-30 NOTE — Patient Outreach (Signed)
[facility]) Care Management  01/30/2021  Samuel Mata 21-Feb-1931 [id number]   RED ON EMMI ALERT - Stroke Day # 13 Date: 9/15 Red Alert Reason: Questions about medications, Not went to follow up appointment, Follow up appointment not scheduled   Outreach attempt #1, successful.  Member does not remember answering above noted questions.  State he has not had his hearing aide since discharge, could have easily misunderstood the questions. Denies having trouble with medications.  He has had appointment with cardiology, will see PCP today, and neurology on 10/27.     Plan: RN CM will follow up within the next week as planned.  [name], [place], MSN [facility] Care Management  [facility] Manager [phone]

## 2021-02-06 ENCOUNTER — Other Ambulatory Visit: Payer: Self-pay | Admitting: *Deleted

## 2021-02-06 NOTE — Patient Outreach (Signed)
[facility]) Care Management  02/06/2021  Samuel Mata Apr 02, 1931 [id number]   Outgoing call placed to member to follow up on stroke recovery.  State he is doing well, feels he has done well at his age for a stroke, back to his baseline.  He does complain of some constipation, will try Mirilax as this has worked in the past.  Denies any urgent concerns, encouraged to contact this care manager with questions.  Will close case at this time.  Samuel Mata, [place], MSN [facility] Care Management  [facility] Manager [phone]

## 2021-02-10 ENCOUNTER — Other Ambulatory Visit: Payer: Self-pay | Admitting: *Deleted

## 2021-02-10 NOTE — Patient Outreach (Signed)
[facility]) Care Management  02/10/2021  Samuel Mata [birth year] [id number]   Notified by CMA that member called nurse triage line with complaints of constipation.  Call placed to member, state he has since had a BM, no further concerns.  Will have visit from [facility] team later today, will discuss further with them if necessary.  Will close case at this time.  [name], [place], MSN [facility] Care Management  [facility] Manager [phone]

## 2021-02-23 ENCOUNTER — Ambulatory Visit (INDEPENDENT_AMBULATORY_CARE_PROVIDER_SITE_OTHER): Payer: Medicare HMO | Admitting: Otolaryngology

## 2021-02-23 ENCOUNTER — Other Ambulatory Visit: Payer: Self-pay

## 2021-02-23 ENCOUNTER — Ambulatory Visit (INDEPENDENT_AMBULATORY_CARE_PROVIDER_SITE_OTHER): Payer: Medicare HMO

## 2021-02-23 DIAGNOSIS — H6123 Impacted cerumen, bilateral: Secondary | ICD-10-CM

## 2021-02-23 DIAGNOSIS — H903 Sensorineural hearing loss, bilateral: Secondary | ICD-10-CM

## 2021-02-23 DIAGNOSIS — I639 Cerebral infarction, unspecified: Secondary | ICD-10-CM | POA: Diagnosis not present

## 2021-02-23 NOTE — Progress Notes (Addendum)
HPI: Samuel Mata is a 87 y.o. male who presents for evaluation of hearing aid clearance referred by his audiologist.  Apparently he had a audiogram performed that showed a conductive hearing loss in the left ear and his audiologist recommended getting hearing aid clearance prior to being fitted for new hearing aids.  He has had hearing aids for a couple of years now and was looking to get new hearing aids. He apparently had a stroke recently on August 30 of this year.  Past Medical History:  Diagnosis Date   Enlarged prostate    Hypertension    Renal stones    Past Surgical History:  Procedure Laterality Date   APPENDECTOMY     CHOLECYSTECTOMY     LOOP RECORDER INSERTION N/A 01/15/2021   Procedure: LOOP RECORDER INSERTION;  Surgeon: Hillis Range, MD;  Location: [facility] INVASIVE CV LAB;  Service: Cardiovascular;  Laterality: N/A;   NOSE SURGERY     SPLENECTOMY, TOTAL     Social History   Socioeconomic History   Marital status: Married    Spouse name: Not on file   Number of children: Not on file   Years of education: Not on file   Highest education level: Not on file  Occupational History   Not on file  Tobacco Use   Smoking status: Former    Packs/day: 1.00    Years: 30.00    Pack years: 30.00    Types: Cigarettes    Quit date: 10/07/1984    Years since quitting: 36.4   Smokeless tobacco: Never  Substance and Sexual Activity   Alcohol use: Never   Drug use: Never   Sexual activity: Not on file  Other Topics Concern   Not on file  Social History Narrative   Not on file   Social Determinants of Health   Financial Resource Strain: Not on file  Food Insecurity: Not on file  Transportation Needs: Not on file  Physical Activity: Not on file  Stress: Not on file  Social Connections: Not on file   Family History  Problem Relation Age of Onset   Lung disease Neg Hx    Allergies  Allergen Reactions   Codeine     unknown   Prior to Admission medications   Medication  Sig Start Date End Date Taking? Authorizing Provider  acetaminophen (TYLENOL) 325 MG tablet Take 325-650 mg by mouth every 6 (six) hours as needed for moderate pain.     [provider]  aspirin EC 81 MG EC tablet Take 1 tablet (81 mg total) by mouth daily. Swallow whole. 01/15/21   Suzan Garibaldi, PA-C  atorvastatin (LIPITOR) 40 MG tablet Take 1 tablet (40 mg total) by mouth daily. 01/15/21   Suzan Garibaldi, PA-C  clopidogrel (PLAVIX) 75 MG tablet Take 1 tablet (75 mg total) by mouth daily. 01/16/21   Suzan Garibaldi, PA-C  ibuprofen (ADVIL) 200 MG tablet Take 200 mg by mouth every 6 (six) hours as needed for mild pain.    [provider]  Naphazoline HCl (CLEAR EYES OP) Place 1 drop into both eyes at bedtime.    [provider]  sodium chloride (OCEAN) 0.65 % nasal spray Place 1 spray into the nose daily as needed for congestion.     [provider]     Positive ROS: Otherwise negative  All other systems have been reviewed and were otherwise negative with the exception of those mentioned in the HPI and as above.  Physical  Exam: Constitutional: Alert, well-appearing, no acute distress Ears: External ears without lesions or tenderness. Ear canals with minimal wax and hairs in both ear canals that was cleaned.  The TMs were otherwise clear with no middle ear fluid noted.  TM abnormality noted on the left side where he was reported to have a conductive hearing loss.  On tuning fork testing with a 512 tuning fork Weber Asher lateralized to the right side and AC was greater than BC bilaterally.  On testing with the 1024 tuning fork he had significant hearing loss in both ears.  Unfortunately the hearing test that he had performed was not available today in the office. Nasal: External nose without lesions.. Clear nasal passages Oral: Lips and gums without lesions. Tongue and palate mucosa without lesions. Posterior oropharynx clear. Neck: No palpable adenopathy or  masses Respiratory: Breathing comfortably  Skin: No facial/neck lesions or rash noted.  Cerumen impaction removal  Date/Time: 02/23/2021 5:41 PM Performed by: Drema Halon, MD Authorized by: Drema Halon, MD   Consent:    Consent obtained:  Verbal   Consent given by:  Patient   Risks discussed:  Pain and bleeding Procedure details:    Location:  L ear and R ear   Procedure type: curette and forceps   Post-procedure details:    Inspection:  TM intact and canal normal   Hearing quality:  Improved   Procedure completion:  Tolerated well, no immediate complications Comments:     He had minimal wax buildup in both ear canals that was cleaned with forceps and curettes.  TMs were otherwise clear.  Assessment: Bilateral sensorineural hearing loss  Plan: He is cleared for hearing aid use.  Narda Bonds, MD

## 2021-02-24 LAB — CUP PACEART REMOTE DEVICE CHECK
Date Time Interrogation Session: 20221010124942
Implantable Pulse Generator Implant Date: 20220901

## 2021-03-03 NOTE — Progress Notes (Signed)
Carelink Summary Report / Loop Recorder 

## 2021-03-12 ENCOUNTER — Encounter: Payer: Self-pay | Admitting: Adult Health

## 2021-03-12 ENCOUNTER — Ambulatory Visit: Payer: Medicare HMO | Admitting: Adult Health

## 2021-03-12 VITALS — BP 148/82 | HR 81 | Ht 67.0 in | Wt 135.0 lb

## 2021-03-12 DIAGNOSIS — E785 Hyperlipidemia, unspecified: Secondary | ICD-10-CM | POA: Diagnosis not present

## 2021-03-12 DIAGNOSIS — Z8673 Personal history of transient ischemic attack (TIA), and cerebral infarction without residual deficits: Secondary | ICD-10-CM

## 2021-03-12 DIAGNOSIS — I639 Cerebral infarction, unspecified: Secondary | ICD-10-CM

## 2021-03-12 DIAGNOSIS — I10 Essential (primary) hypertension: Secondary | ICD-10-CM

## 2021-03-12 NOTE — Progress Notes (Signed)
[facility] [address] [place]. [place] [address] [phone]       HOSPITAL FOLLOW UP NOTE  [name] Date of Birth:  05/26/1930 Medical Record Number:  [id number]   Reason for Referral:  hospital stroke follow up    SUBJECTIVE:   CHIEF COMPLAINT:  Chief Complaint  Patient presents with   Follow-up    Rm 3 with spouse Samuel Mata Pt is well, denies residual deficits    HPI:   [name] is a 87 y.o. male with history of hypertension, remote cerebellar stroke who presented by EMS on 01/13/2021 for acute onset of aphasia.  Personally reviewed hospitalization pertinent progress notes, lab work and imaging.  NIHSS = 3. CTH no hemorrhage. TNK administered.  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  MRI showed embolic appearing punctate stroke at left periolandic cortex.  Etiology cryptogenic therefore loop recorder placed.  EF 60 to 65%.  LDL 121.  A1c 5.5.  Recommended DAPT for 3 months given right carotid artery dissection and then aspirin alone as well as initiated atorvastatin 40 mg daily.    Today, 03/12/2021, being seen for initial hospital stroke follow-up accompanied by his wife. Overall doing well. Denies speech, swallowing, weakness or worsening baseline gait impairment since stroke. Recently completed home health therapies.  Continues to use a cane and denies any recent falls.  Denies new stroke/TIA symptoms. Loop recorder has not shown atrial fibrillation thus far.  Remains on aspirin Plavix as well as atorvastatin without side effects.  Blood pressure today 140/82.  Routinely monitors at home and typically stable.  No new concerns at this time.      PERTINENT IMAGING  MR BRAIN WO CONTRAST 01/14/2021 IMPRESSION: 1. Punctate acute infarct in the high left frontoparietal perirolandic cortex. 2. Mild to moderate for age chronic microvascular ischemic  disease, atrophy, and chronic left cerebellar infarct. 3. 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  CTA HEAD/NECK 01/13/2021 IMPRESSION: 1. Linear filling defect in the distal right common carotid artery extending to the proximal internal carotid artery suspicious for acute dissection, with mixing artifact considered less likely though not entirely excluded. Short follow up may be considered to assess for persistence. There is less than 50% narrowing of the true lumen of the common and internal carotid arteries. This result was called by telephone at the time of interpretation on 01/13/2021 at 3:11 pm to provider Samuel Mata , who verbally acknowledged these results. 2. Scattered atherosclerotic disease throughout the remainder of the vasculature of the head and neck with no other significant stenosis, occlusion, dissection, or aneurysm. 3. Scattered nodular opacities throughout both lung apices most suggestive of multifocal infectious/inflammatory etiology. Recommend dedicated imaging of the chest or follow-up CT in 1-3 months to assess for resolution.       ROS:   14 system review of systems performed and negative with exception of those listed in HPI  PMH:  Past Medical History:  Diagnosis Date   Enlarged prostate    Hypertension    Renal stones     PSH:  Past Surgical History:  Procedure Laterality Date   APPENDECTOMY     CHOLECYSTECTOMY     LOOP RECORDER INSERTION N/A 01/15/2021   Procedure: LOOP RECORDER INSERTION;  Surgeon: Samuel Range, MD;  Location: [facility] INVASIVE CV LAB;  Service: Cardiovascular;  Laterality: N/A;   NOSE SURGERY  SPLENECTOMY, TOTAL      Social History:  Social History   Socioeconomic History   Marital status: Married    Spouse name: Not on file   Number of children: Not on file   Years of education: Not on file   Highest education level: Not on file  Occupational History    Not on file  Tobacco Use   Smoking status: Former    Packs/day: 1.00    Years: 30.00    Pack years: 30.00    Types: Cigarettes    Quit date: 10/07/1984    Years since quitting: 36.4   Smokeless tobacco: Never  Substance and Sexual Activity   Alcohol use: Never   Drug use: Never   Sexual activity: Not on file  Other Topics Concern   Not on file  Social History Narrative   Not on file   Social Determinants of Health   Financial Resource Strain: Not on file  Food Insecurity: Not on file  Transportation Needs: Not on file  Physical Activity: Not on file  Stress: Not on file  Social Connections: Not on file  Intimate Partner Violence: Not on file    Family History:  Family History  Problem Relation Age of Onset   Lung disease Neg Hx     Medications:   Current Outpatient Medications on File Prior to Visit  Medication Sig Dispense Refill   acetaminophen (TYLENOL) 325 MG tablet Take 650 mg by mouth as needed.     aspirin EC 81 MG EC tablet Take 1 tablet (81 mg total) by mouth daily. Swallow whole. 30 tablet 11   atorvastatin (LIPITOR) 40 MG tablet Take 1 tablet (40 mg total) by mouth daily. 30 tablet 3   clopidogrel (PLAVIX) 75 MG tablet Take 1 tablet (75 mg total) by mouth daily. 30 tablet 3   Naphazoline HCl (CLEAR EYES OP) Place 1 drop into both eyes at bedtime.     sodium chloride (OCEAN) 0.65 % nasal spray Place 1 spray into the nose daily as needed for congestion.      No current facility-administered medications on file prior to visit.    Allergies:   Allergies  Allergen Reactions   Codeine     unknown      OBJECTIVE:  Physical Exam  Vitals:   03/12/21 1415  BP: (!) 148/82  Pulse: 81  Weight: 135 lb (61.2 kg)  Height: 5\' 7"  (1.702 m)   Body mass index is 21.14 kg/m. No results found.  Post stroke PHQ 2/9 Depression screen PHQ 2/9 03/12/2021  Decreased Interest 0  Down, Depressed, Hopeless 0  PHQ - 2 Score 0     General: well developed,  well nourished, very pleasant elderly Caucasian male, seated, in no evident distress Head: head normocephalic and atraumatic.   Neck: supple with no carotid or supraclavicular bruits Cardiovascular: regular rate and rhythm, no murmurs Musculoskeletal: no deformity Skin:  no rash/petichiae Vascular:  Normal pulses all extremities   Neurologic Exam Mental Status: Awake and fully alert.  Fluent speech and language.  Oriented to place and time. Recent and remote memory intact. Attention span, concentration and fund of knowledge appropriate. Mood and affect appropriate.  Cranial Nerves: Fundoscopic exam reveals sharp disc margins. Pupils equal, briskly reactive to light. Extraocular movements full without nystagmus. Visual fields full to confrontation.  HOH bilaterally. Facial sensation intact. Face, tongue, palate moves normally and symmetrically.  Motor: Normal bulk and tone. Normal strength in all tested extremity muscles Sensory.: intact  to touch , pinprick , position and vibratory sensation.  Coordination: Rapid alternating movements normal in all extremities. Finger-to-nose and heel-to-shin performed accurately bilaterally. Gait and Station: Arises from chair without difficulty. Stance is normal. Gait demonstrates normal stride length and balance with use of cane. Tandem walk and heel toe not attempted Reflexes: 1+ and symmetric. Toes downgoing.     NIHSS  0 Modified Rankin  0      ASSESSMENT: Samuel Mata is a 87 y.o. year old male with left frontoparietal cortex embolic stroke s/p TNK on 01/13/2021 of cryptogenic etiology. 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      PLAN:  Cryptogenic stroke: Recovered well without residual deficit.  Continue aspirin 81 mg daily and clopidogrel 75 mg daily  and atorvastatin 40 mg daily for secondary stroke prevention.  Discontinue  plavix after 12/1.  Loop recorder has not shown atrial fibrillation thus far.  Discussed secondary stroke prevention measures and importance of close PCP follow up for aggressive stroke risk factor management. I have gone over the pathophysiology of stroke, warning signs and symptoms, risk factors and their management in some detail with instructions to go to the closest emergency room for symptoms of concern. HTN: BP goal <130/90.  Stable on nonpharmacological management per PCP HLD: LDL goal <70. Recent LDL 121.  Continue atorvastatin 40 mg daily per PCP    Follow up in 6 months or call earlier if needed   CC:  [facility] provider: Dr. Pearlean Brownie PCP: [facility]    I spent 56 minutes of face-to-face and non-face-to-face time with patient and wife.  This included previsit chart review including review of recent hospitalization, lab review, study review, electronic health record documentation, patient and wife education regarding recent stroke including etiology, indication for loop recorder and review, evidence of carotid dissection, secondary stroke prevention measures and importance of managing stroke risk factors, and answered all other questions to patient and wife's satisfaction   Ihor Austin, AGNP-BC  [facility] [address] [place], [place] [address]  Phone [phone] Fax [phone] Note: This document was prepared with digital dictation and possible smart phrase technology. Any transcriptional errors that result from this process are unintentional.

## 2021-03-12 NOTE — Patient Instructions (Signed)
Continue aspirin 81 mg daily  and atorvastatin 40mg  daily  for secondary stroke prevention  Continue plavix until 12/1 then discontinue  Continue to follow up with PCP regarding cholesterol and blood pressure management  Maintain strict control of hypertension with blood pressure goal below 130/90 and cholesterol with LDL cholesterol (bad cholesterol) goal below 70 mg/dL.   Loop recorder has not shown atrial fibrillation thus far - will continue to be monitored      Followup in the future with me in 6 months or call earlier if needed       Thank you for coming to see 14/1 at [facility]. I hope we have been able to provide you high quality care today.  You may receive a patient satisfaction survey over the next few weeks. We would appreciate your feedback and comments so that we may continue to improve ourselves and the health of our patients.

## 2021-03-30 ENCOUNTER — Ambulatory Visit (INDEPENDENT_AMBULATORY_CARE_PROVIDER_SITE_OTHER): Payer: Medicare HMO

## 2021-03-30 DIAGNOSIS — I639 Cerebral infarction, unspecified: Secondary | ICD-10-CM

## 2021-03-31 LAB — CUP PACEART REMOTE DEVICE CHECK
Date Time Interrogation Session: 20221112124958
Implantable Pulse Generator Implant Date: 20220901

## 2021-04-06 NOTE — Progress Notes (Signed)
Carelink Summary Report / Loop Recorder 

## 2021-04-23 ENCOUNTER — Other Ambulatory Visit: Payer: Self-pay

## 2021-04-23 NOTE — Patient Outreach (Signed)
[facility]) Care Management  04/23/2021  Samuel Mata 11-06-1930 [id number]   Telephone outreach to patient to obtain mRS was successfully completed. MRS= 0   [name] Care Management Assistant

## 2021-05-01 LAB — CUP PACEART REMOTE DEVICE CHECK
Date Time Interrogation Session: 20221215124731
Implantable Pulse Generator Implant Date: 20220901

## 2021-05-04 ENCOUNTER — Ambulatory Visit (INDEPENDENT_AMBULATORY_CARE_PROVIDER_SITE_OTHER): Payer: Medicare HMO

## 2021-05-04 DIAGNOSIS — I639 Cerebral infarction, unspecified: Secondary | ICD-10-CM | POA: Diagnosis not present

## 2021-05-13 NOTE — Progress Notes (Signed)
Carelink Summary Report / Loop Recorder 

## 2021-05-22 ENCOUNTER — Encounter (HOSPITAL_COMMUNITY): Payer: Self-pay | Admitting: Radiology

## 2021-06-08 ENCOUNTER — Ambulatory Visit (INDEPENDENT_AMBULATORY_CARE_PROVIDER_SITE_OTHER): Payer: Medicare HMO

## 2021-06-08 DIAGNOSIS — I639 Cerebral infarction, unspecified: Secondary | ICD-10-CM | POA: Diagnosis not present

## 2021-06-08 LAB — CUP PACEART REMOTE DEVICE CHECK
Date Time Interrogation Session: 20230122230817
Implantable Pulse Generator Implant Date: 20220901

## 2021-06-18 NOTE — Progress Notes (Signed)
Carelink Summary Report / Loop Recorder 

## 2021-07-11 LAB — CUP PACEART REMOTE DEVICE CHECK
Date Time Interrogation Session: 20230224230842
Implantable Pulse Generator Implant Date: 20220901

## 2021-07-13 ENCOUNTER — Ambulatory Visit (INDEPENDENT_AMBULATORY_CARE_PROVIDER_SITE_OTHER): Payer: Medicare HMO

## 2021-07-13 DIAGNOSIS — I639 Cerebral infarction, unspecified: Secondary | ICD-10-CM

## 2021-07-20 NOTE — Progress Notes (Signed)
Carelink Summary Report / Loop Recorder 

## 2021-08-17 ENCOUNTER — Ambulatory Visit (INDEPENDENT_AMBULATORY_CARE_PROVIDER_SITE_OTHER): Payer: Medicare HMO

## 2021-08-17 DIAGNOSIS — I639 Cerebral infarction, unspecified: Secondary | ICD-10-CM

## 2021-08-18 LAB — CUP PACEART REMOTE DEVICE CHECK
Date Time Interrogation Session: 20230331230748
Implantable Pulse Generator Implant Date: 20220901

## 2021-08-24 ENCOUNTER — Ambulatory Visit (HOSPITAL_COMMUNITY)
Admission: EM | Admit: 2021-08-24 | Discharge: 2021-08-24 | Disposition: A | Discharge status: Home / Self Care | Payer: Medicare HMO | Attending: Physician Assistant | Admitting: Physician Assistant

## 2021-08-24 ENCOUNTER — Ambulatory Visit (INDEPENDENT_AMBULATORY_CARE_PROVIDER_SITE_OTHER): Payer: Medicare HMO

## 2021-08-24 ENCOUNTER — Encounter (HOSPITAL_COMMUNITY): Payer: Self-pay | Admitting: *Deleted

## 2021-08-24 DIAGNOSIS — W19XXXA Unspecified fall, initial encounter: Secondary | ICD-10-CM

## 2021-08-24 DIAGNOSIS — M545 Low back pain, unspecified: Secondary | ICD-10-CM | POA: Diagnosis not present

## 2021-08-24 DIAGNOSIS — S22080A Wedge compression fracture of T11-T12 vertebra, initial encounter for closed fracture: Secondary | ICD-10-CM

## 2021-08-24 HISTORY — DX: Cerebral infarction, unspecified: I63.9

## 2021-08-24 IMAGING — DX DG LUMBAR SPINE 2-3V
2 series · 2 of 2 positions shown · non-contrast
Comparison: Chest radiograph [DATE]

CLINICAL DATA: 89-year-old with pain after fall on [REDACTED] landing
on tile floor

EXAM:
LUMBAR SPINE - 2-3 VIEW

[l-spine ap]
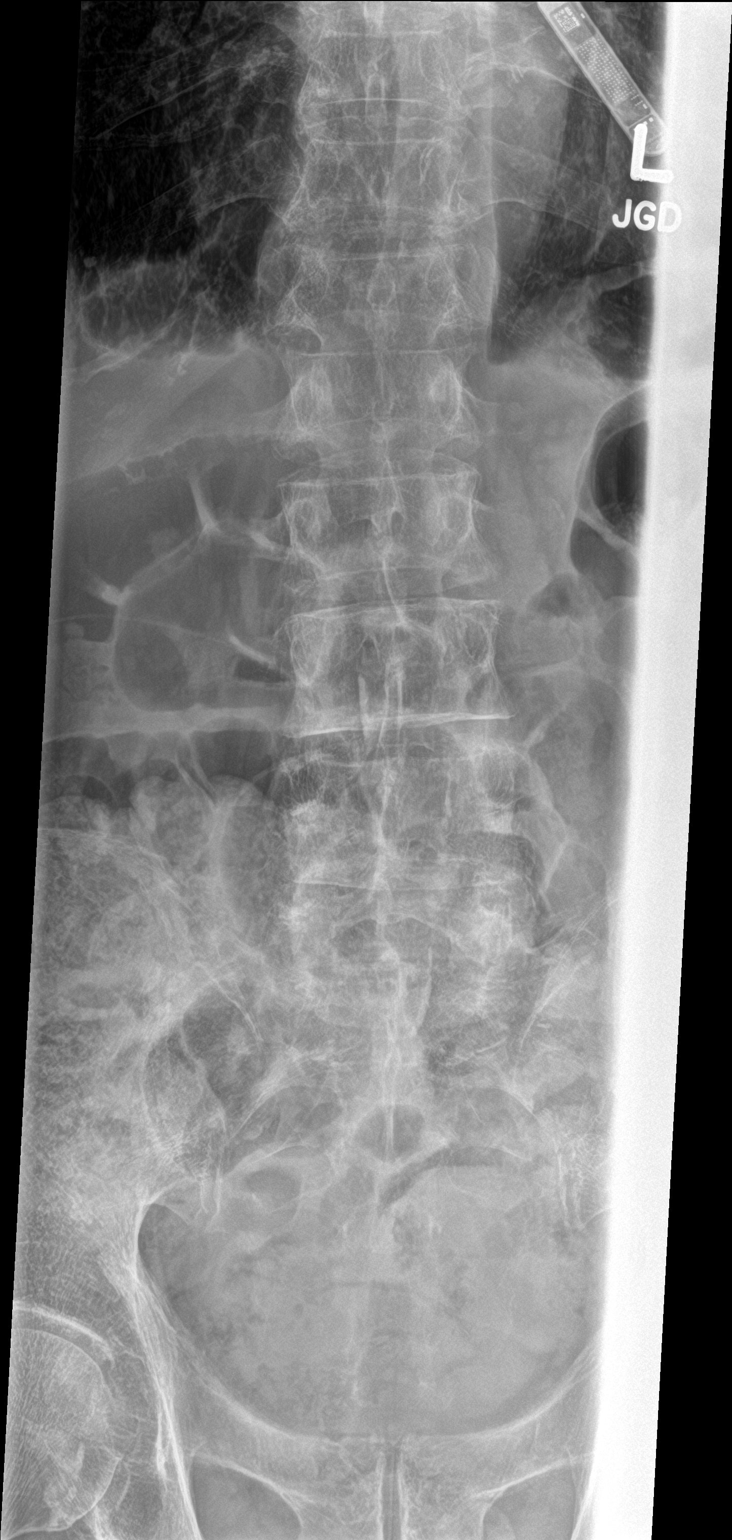

[l-spine lat]
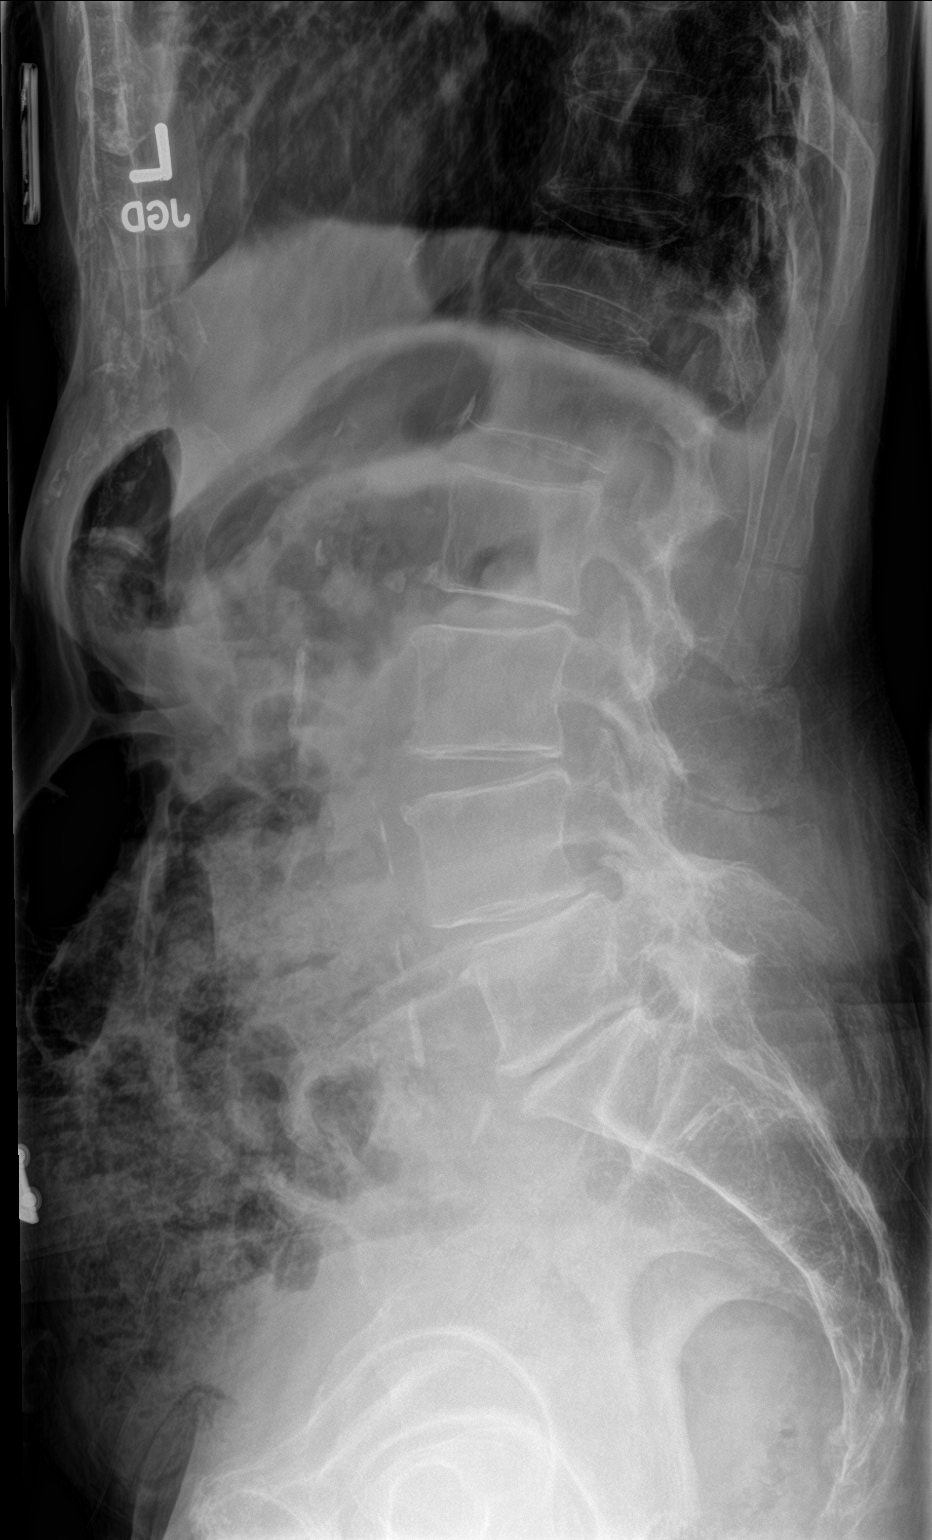

[2 of 2 positions shown; findings below may reference images not displayed]

FINDINGS: There are 5 non-rib-bearing lumbar vertebra. The bones are
subjectively under mineralized. Mild to moderate T12 compression
fracture with approximately 35% loss of height anteriorly, new from
prior chest radiograph. Lumbar vertebral body heights are preserved.
5 mm anterolisthesis of L4 on L5. L4-L5 and L5-S1 degenerative disc
disease with disc space narrowing and spurring. Moderate L4-L5 facet
hypertrophy.
IMPRESSION: 1. Mild to moderate T12 compression fracture with approximately 35%
loss of height anteriorly, new from prior chest radiograph.
2. No acute fracture of the lumbar spine.
3. Lower lumbar degenerative disc disease. Grade L4-L5
anterolisthesis likely degenerative with moderate facet hypertrophy
at this level.

## 2021-08-24 NOTE — Discharge Instructions (Addendum)
Xrays shows a compression fracture at T12, although we do not know if this is acute or chronic ?Recommend a back brace for support and pain control ?Can continue with Tylenol as needed ?Follow up with ortho spine ?

## 2021-08-24 NOTE — ED Provider Notes (Incomplete)
?[facility] ? ? ? ?CSN: [id number] ?Arrival date & time: 08/24/21  1400 ? ? ?  ? ?History   ?Chief Complaint ?Chief Complaint  ?Patient presents with  ? Back Pain  ? ? ?HPI ?Samuel Mata is a 88 y.o. male.  ? ?HPI ? ?Past Medical History:  ?Diagnosis Date  ? Enlarged prostate   ? Hypertension   ? Renal stones   ? Stroke [facility])   ? ? ?Patient Active Problem List  ? Diagnosis Date Noted  ? Acute ischemic stroke (HCC)   ? Carotid dissection, bilateral (HCC)   ? Aphasia   ? Immunization due 05/20/2020  ? 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 Medication monitoring encounter 03/07/2020  ? 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 Bronchiectasis without complication (HCC) 11/22/2019  ? Hereditary spherocytosis (HCC) 08/29/2019  ? Seborrheic keratoses 08/29/2019  ? OSA (obstructive sleep apnea) 03/19/2019  ? History of TIA (transient ischemic attack) 01/23/2018  ? Hemoptysis 07/15/2017  ? Pulmonary infiltrates 07/15/2017  ? History of tobacco use 06/06/2017  ? Essential hypertension 09/22/2015  ? Gastroesophageal reflux disease without esophagitis 06/05/2014  ? 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 Chronic obstructive pulmonary disease (HCC) 08/14/2013  ? Chronic rhinitis 08/14/2013  ? Hearing loss of both ears 08/14/2013  ? ? ?Past Surgical History:  ?Procedure Laterality Date  ? APPENDECTOMY    ? CHOLECYSTECTOMY    ? LOOP RECORDER INSERTION N/A 01/15/2021  ? Procedure: LOOP RECORDER INSERTION;  Surgeon: Hillis Range, MD;  [facility];  Service: Cardiovascular;  Laterality: N/A;  ? NOSE SURGERY    ? SPLENECTOMY, TOTAL    ? ? ? ? ? ?Home Medications   ? ?Prior to Admission medications   ?Medication Sig Start Date End Date Taking? Authorizing Provider  ?acetaminophen (TYLENOL) 325 MG tablet Take 650 mg by mouth as needed.    [provider]  ?aspirin EC 81 MG EC tablet Take 1 tablet (81 mg total) by mouth daily. Swallow whole. 01/15/21   Suzan Garibaldi, PA-C  ?atorvastatin (LIPITOR) 40 MG tablet Take 1  tablet (40 mg total) by mouth daily. 01/15/21   Suzan Garibaldi, PA-C  ?clopidogrel (PLAVIX) 75 MG tablet Take 1 tablet (75 mg total) by mouth daily. 01/16/21   Suzan Garibaldi, PA-C  ?Naphazoline HCl (CLEAR EYES OP) Place 1 drop into both eyes at bedtime.    [provider]  ?sodium chloride (OCEAN) 0.65 % nasal spray Place 1 spray into the nose daily as needed for congestion.     [provider]  ? ? ?Family History ?Family History  ?Problem Relation Age of Onset  ? Lung disease Neg Hx   ? ? ?Social History ?Social History  ? ?Tobacco Use  ? Smoking status: Former  ?  Packs/day: 1.00  ?  Years: 30.00  ?  Pack years: 30.00  ?  Types: Cigarettes  ?  Quit date: 10/07/1984  ?  Years since quitting: 36.9  ? Smokeless tobacco: Never  ?Substance Use Topics  ? Alcohol use: Never  ? Drug use: Never  ? ? ? ?Allergies   ?Codeine ? ? ?Review of Systems ?Review of Systems ? ? ?Physical Exam ?Triage Vital Signs ?ED Triage Vitals  ?Enc Vitals Group  ?   BP 08/24/21 1503 (!) 165/83  ?   Pulse Rate 08/24/21 1503 69  ?   Resp 08/24/21 1503 18  ?   Temp 08/24/21 1503 98 ?F (36.7 ?C)  ?   Temp src --   ?  SpO2 08/24/21 1503 96 %  ?   Weight --   ?   Height --   ?   Head Circumference --   ?   Peak Flow --   ?   Pain Score 08/24/21 1458 7  ?   Pain Loc --   ?   Pain Edu? --   ?   Excl. in GC? --   ? ?No data found. ? ?Updated Vital Signs ?BP (!) 165/83   Pulse 69   Temp 98 ?F (36.7 ?C)   Resp 18   SpO2 96%  ? ?Visual Acuity ?Right Eye Distance:   ?Left Eye Distance:   ?Bilateral Distance:   ? ?Right Eye Near:   ?Left Eye Near:    ?Bilateral Near:    ? ?Physical Exam ? ? ?UC Treatments / Results  ?Labs ?(all labs ordered are listed, but only abnormal results are displayed) ?Labs Reviewed - No data to display ? ?EKG ? ? ?Radiology ?DG Lumbar Spine 2-3 Views ? ?Result Date: 08/24/2021 ?CLINICAL DATA:  88 year old with pain after fall on Saturday landing on tile floor EXAM: LUMBAR SPINE - 2-3 VIEW COMPARISON:  Chest  radiograph 07/22/2020 FINDINGS: There are 5 non-rib-bearing lumbar vertebra. The bones are subjectively under mineralized. Mild to moderate T12 compression fracture with approximately 35% loss of height anteriorly, new from prior chest radiograph. Lumbar vertebral body heights are preserved. 5 mm anterolisthesis of L4 on L5. L4-L5 and L5-S1 degenerative disc disease with disc space narrowing and spurring. Moderate L4-L5 facet hypertrophy. IMPRESSION: 1. Mild to moderate T12 compression fracture with approximately 35% loss of height anteriorly, new from prior chest radiograph. 2. No acute fracture of the lumbar spine. 3. Lower lumbar degenerative disc disease. Grade L4-L5 anterolisthesis likely degenerative with moderate facet hypertrophy at this level. Electronically Signed   By: Narda Rutherford M.D.   On: 08/24/2021 15:56   ? ?Procedures ?Procedures (including critical care time) ? ?Medications Ordered in UC ?Medications - No data to display ? ?Initial Impression / Assessment and Plan / UC Course  ?I have reviewed the triage vital signs and the nursing notes. ? ?Pertinent labs & imaging results that were available during my care of the patient were reviewed by me and considered in my medical decision making (see chart for details). ? ?  ? ?*** ?Final Clinical Impressions(s) / UC Diagnoses  ? ?Final diagnoses:  ?Acute midline low back pain without sciatica  ?Compression fracture of T12 vertebra, initial encounter (HCC)  ? ? ? ?Discharge Instructions   ? ?  ?Xrays shows a compression fracture at T12, although we do not know if this is acute or chronic ?Recommend a back brace for support and pain control ?Can continue with Tylenol as needed ?Follow up with ortho spine ? ? ?ED Prescriptions   ?None ?  ? ?PDMP not reviewed this encounter. ?

## 2021-08-24 NOTE — ED Triage Notes (Signed)
Pt had a witnessed fall at home on SAt. Pt fell onto Lt hip onto tile floor in kitchen from a standing position. ?

## 2021-08-28 ENCOUNTER — Other Ambulatory Visit: Payer: Self-pay | Admitting: Orthopaedic Surgery

## 2021-08-28 DIAGNOSIS — S22080A Wedge compression fracture of T11-T12 vertebra, initial encounter for closed fracture: Secondary | ICD-10-CM

## 2021-08-28 DIAGNOSIS — M545 Low back pain, unspecified: Secondary | ICD-10-CM

## 2021-08-30 ENCOUNTER — Ambulatory Visit
Admission: RE | Admit: 2021-08-30 | Discharge: 2021-08-30 | Disposition: A | Discharge status: Home / Self Care | Payer: Medicare HMO | Source: Ambulatory Visit | Attending: Orthopaedic Surgery | Admitting: Orthopaedic Surgery

## 2021-08-30 DIAGNOSIS — M545 Low back pain, unspecified: Secondary | ICD-10-CM

## 2021-08-30 DIAGNOSIS — S22080A Wedge compression fracture of T11-T12 vertebra, initial encounter for closed fracture: Secondary | ICD-10-CM

## 2021-08-30 IMAGING — MR MR LUMBAR SPINE W/O CM
4 of 5 series · 26 of 48 positions shown · non-contrast
Comparison: Lumbar spine x-rays dated [DATE].

CLINICAL DATA: Recent fall.  T12 compression fracture.

EXAM:
MRI LUMBAR SPINE WITHOUT CONTRAST
TECHNIQUE: Multiplanar, multisequence MR imaging of the lumbar spine was
performed. No intravenous contrast was administered.

[Series 3: T2 · sagittal · 4.0mm · 1.09mm/px · 5 of 17 slices shown (1 of 2)]
[im 1/17]
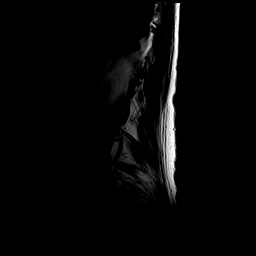
[im 5/17]
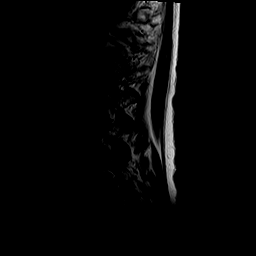
[im 9/17]
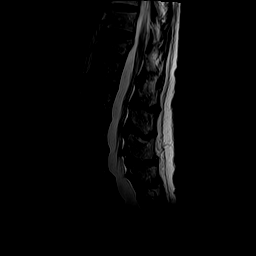
[im 13/17]
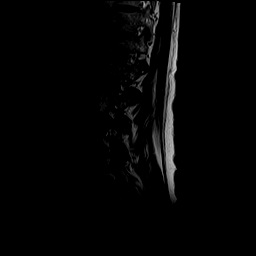
[im 17/17]
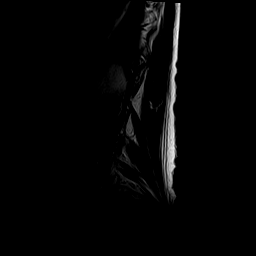

[Series 4: T1 · sagittal · 4.0mm · 1.09mm/px · 5 of 17 slices shown (1 of 2)]
[im 1/17]
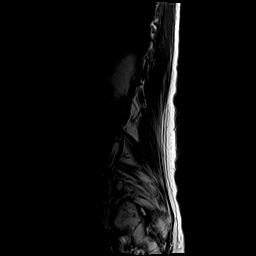
[im 5/17]
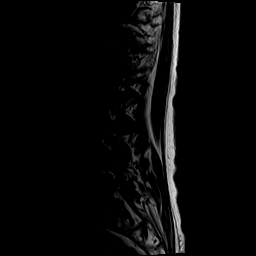
[im 9/17]
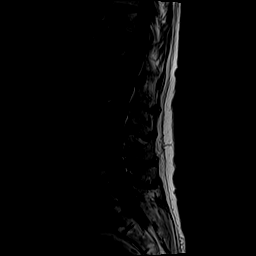
[im 13/17]
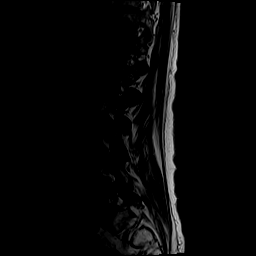
[im 17/17]
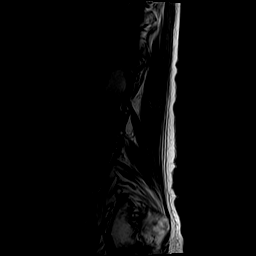

[Series 6: T2 · axial · 4.0mm · 0.39mm/px · z∈[-178,+60]mm · 10 of 48 slices shown (2 of 2)]
[im 4/48]
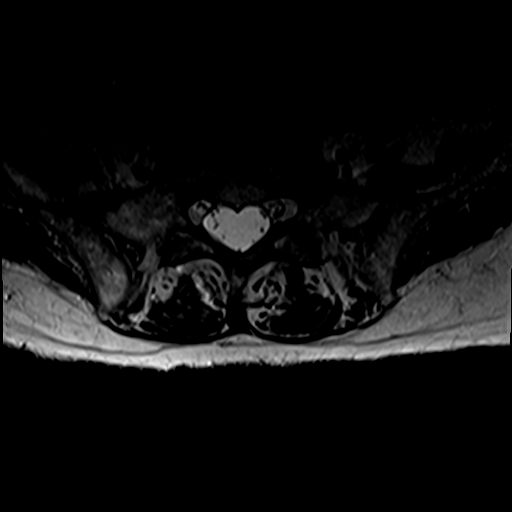
[im 7/48]
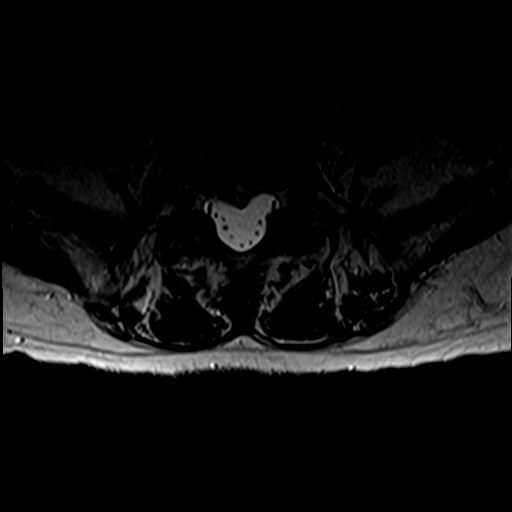
[im 10/48]
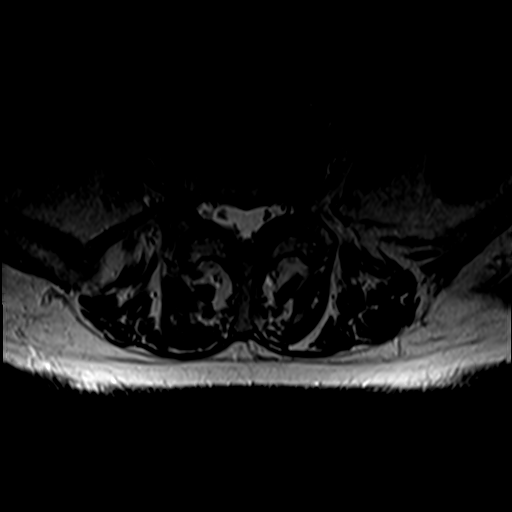
[im 16/48]
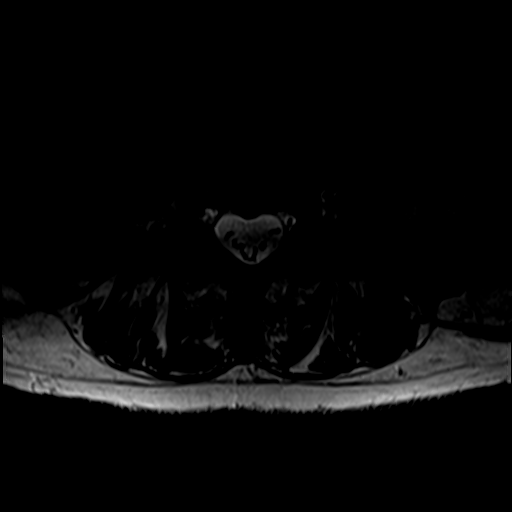
[im 22/48]
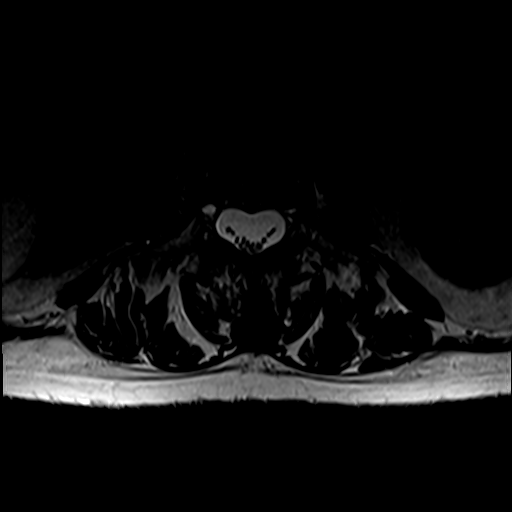
[im 26/48]
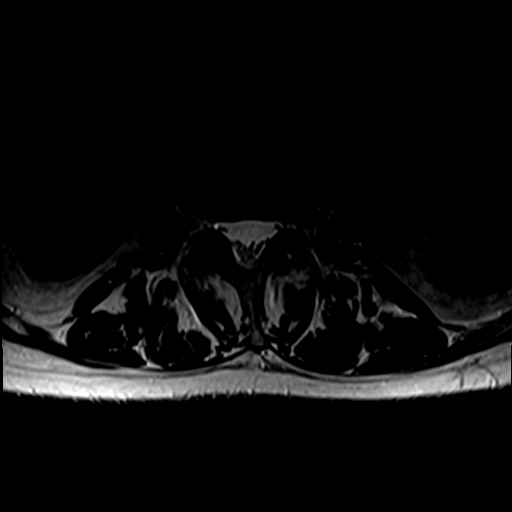
[im 29/48]
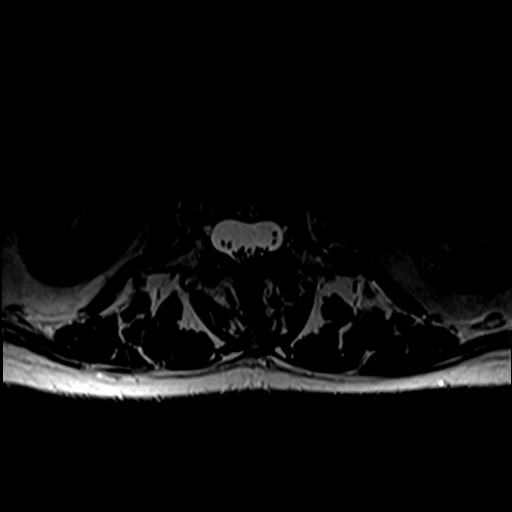
[im 35/48]
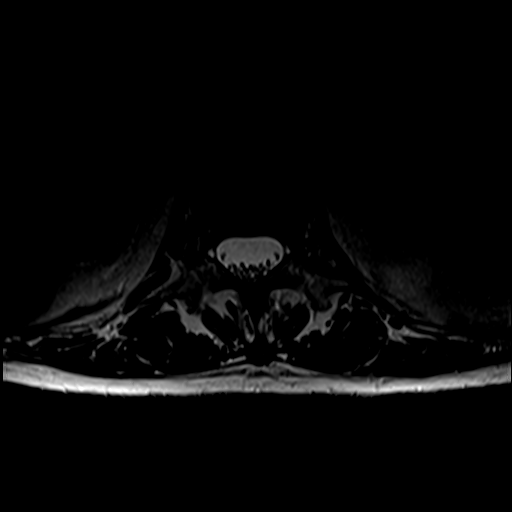
[im 41/48]
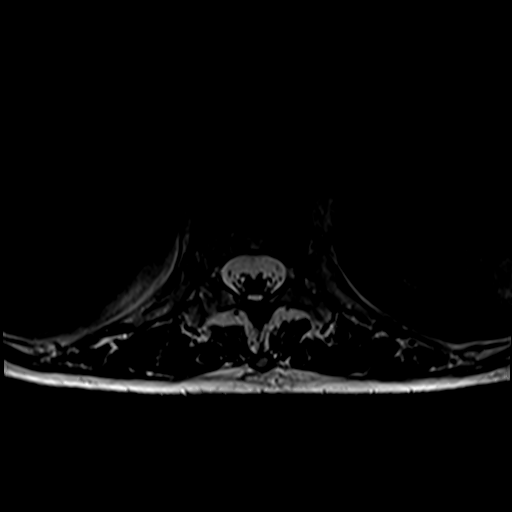
[im 48/48]
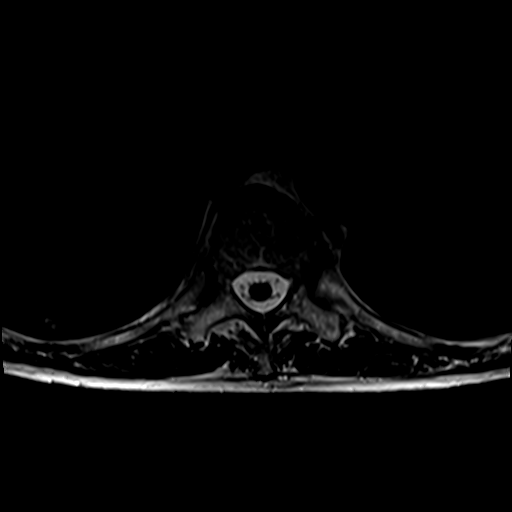

[Series 7: T1 · axial · 4.0mm · 0.39mm/px · z∈[-178,+26]mm · 6 of 48 slices shown (2 of 2)]
[im 4/48]
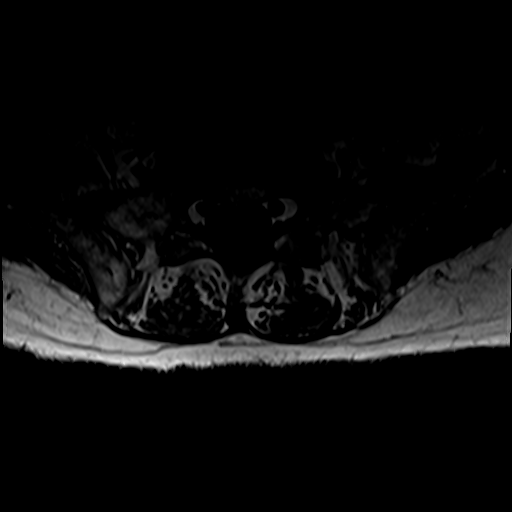
[im 7/48]
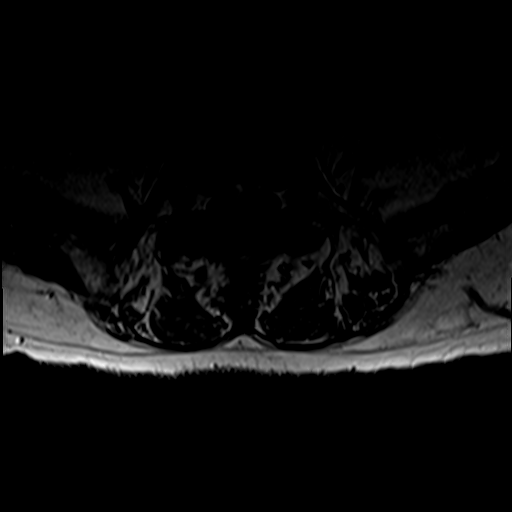
[im 10/48]
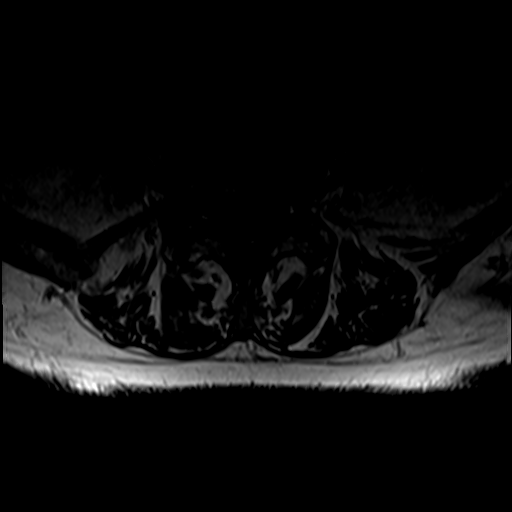
[im 16/48]
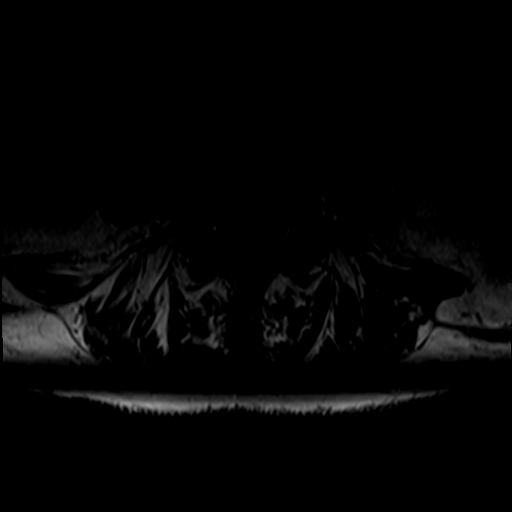
[im 26/48]
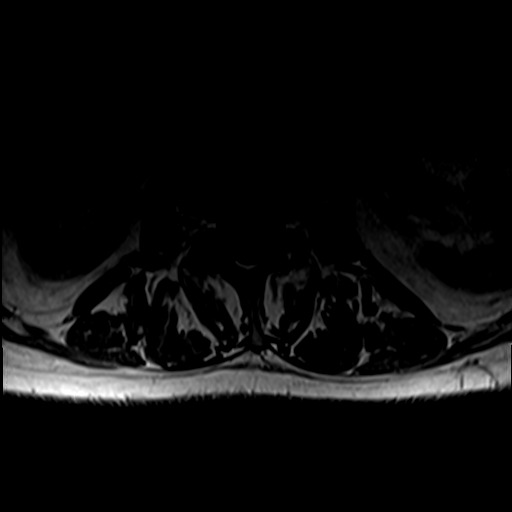
[im 41/48]
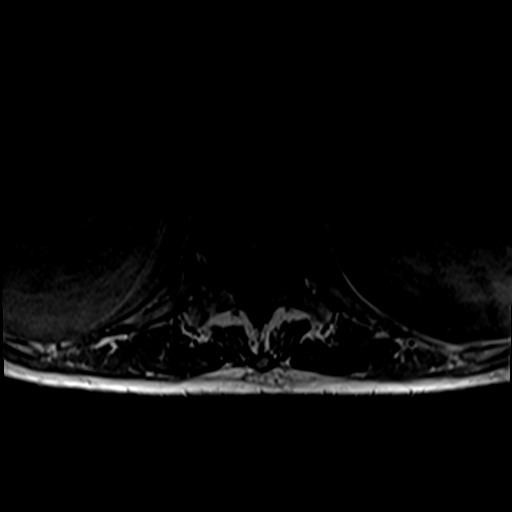

[26 of 48 positions shown; findings below may reference images not displayed]

FINDINGS: Segmentation:  Standard.

Alignment: Trace anterolisthesis at L3-L4. 4 mm anterolisthesis at
L4-L5.

Vertebrae: Acute T12 superior endplate compression fracture with 35%
height loss and 2-3 mm retropulsion of the posterosuperior endplate.
No additional fracture. No evidence of discitis or suspicious bone
lesion.

Conus medullaris and cauda equina: Conus extends to the L1 level.
Conus and cauda equina appear normal.

Paraspinal and other soft tissues: Bilateral renal simple cysts. No
follow-up imaging is recommended.

Disc levels:

T11-T12: Negative.

T12-L1:  Negative.

L1-L2:  Minimal disc bulging.  No stenosis.

L2-L3: Mild disc bulging and right facet arthropathy. Mild bilateral
lateral recess stenosis. No spinal canal or neuroforaminal stenosis.

L3-L4: Mild disc bulging and right facet arthropathy. Mild bilateral
lateral recess stenosis. No spinal canal or neuroforaminal stenosis.

L4-L5: Disc uncovering and mild disc bulging. Moderate to severe
bilateral facet arthropathy. Mild-to-moderate bilateral lateral
recess stenosis. Mild right neuroforaminal stenosis. No spinal canal
or left neuroforaminal stenosis.

L5-S1: Small circumferential disc osteophyte complex. Mild bilateral
facet arthropathy. No stenosis.
IMPRESSION: 1. Acute T12 superior endplate compression fracture.
2. Multilevel lumbar spondylosis as described above. No high-grade
stenosis.

## 2021-09-01 NOTE — Progress Notes (Signed)
Carelink Summary Report / Loop Recorder 

## 2021-09-17 ENCOUNTER — Ambulatory Visit: Payer: Medicare HMO | Admitting: Adult Health

## 2021-09-17 ENCOUNTER — Encounter: Payer: Self-pay | Admitting: Adult Health

## 2021-09-17 VITALS — BP 132/77 | HR 79 | Ht 67.0 in | Wt 137.2 lb

## 2021-09-17 DIAGNOSIS — Z8673 Personal history of transient ischemic attack (TIA), and cerebral infarction without residual deficits: Secondary | ICD-10-CM | POA: Diagnosis not present

## 2021-09-17 DIAGNOSIS — I639 Cerebral infarction, unspecified: Secondary | ICD-10-CM

## 2021-09-17 DIAGNOSIS — R2689 Other abnormalities of gait and mobility: Secondary | ICD-10-CM

## 2021-09-17 LAB — CUP PACEART REMOTE DEVICE CHECK
Date Time Interrogation Session: 20230503231534
Implantable Pulse Generator Implant Date: 20220901

## 2021-09-17 MED ORDER — ATORVASTATIN CALCIUM 40 MG PO TABS: 40.0000 mg | ORAL_TABLET | Freq: Every day | ORAL | 3 refills | Status: DC

## 2021-09-17 NOTE — Progress Notes (Signed)
?[facility] ?[address] ?[place] [address] ?[phone] ? ?     STROKE FOLLOW UP NOTE ? ?Samuel Mata ?Date of Birth:  06/08/1930 ?Medical Record Number:  [id number]  ? ?Reason for Referral: stroke follow up ? ? ? ?SUBJECTIVE: ? ? ?CHIEF COMPLAINT:  ?Chief Complaint  ?Patient presents with  ? Stroke  ?  Rm 3, 6 month FU, wife- [name] I'm not as steady as I used to be"   ? ? ?HPI:  ? ?Update 09/17/2021 JM: Patient returns for 6-month stroke follow-up.  Overall stable without new or reoccurring stroke/TIA symptoms.  Continued use of cane for ambulation but unfortunately he had a fall on 4/10 after losing his balance (tried to step backwards and turned too quickly in kitchen and fell) with resultant T12 superior endplate compression fracture.  He is currently being followed by Dr. Patrice Paradise orthopedics and underwent kyphoplasty 4/28 with excellent improvement of pain.  He has not yet been back to exercising but was cleared to do light walking per wife. He has f/u 5/18 with ortho and possible PT after.  He doesn't feel as steady as he used to be, denies vertigo/dizziness, visual changes, weakness, numbness/tingling or any other stroke/neurological symptoms.  He has not been taking atorvastatin or aspirin since around December as he did not think these were still needed.  Blood pressure today 132/77.  Loop recorder has not shown atrial fibrillation thus far.  Has not had recent follow-up with PCP.  No new concerns at this time. ? ? ? ? ?History provided for reference purposes only ?Initial visit 03/12/2021 JM: Being seen for initial hospital stroke follow-up accompanied by his wife. Overall doing well. Denies speech, swallowing, weakness or worsening baseline gait impairment since stroke. Recently completed home health therapies.  Continues to use a cane and denies any recent falls.  Denies new stroke/TIA symptoms. Loop recorder has not shown atrial fibrillation thus far.  Remains on  aspirin Plavix as well as atorvastatin without side effects.  Blood pressure today 140/82.  Routinely monitors at home and typically stable.  No new concerns at this time. ? ?Stroke admission 01/13/2021 ?[name] is a 88 y.o. male with history of hypertension, remote cerebellar stroke who presented by EMS on 01/13/2021 for acute onset of aphasia.  Personally reviewed hospitalization pertinent progress notes, lab work and imaging.  NIHSS = 3. CTH no hemorrhage. TNK administered.  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  MRI showed embolic appearing punctate stroke at left periolandic cortex.  Etiology cryptogenic therefore loop recorder placed.  EF 60 to 65%.  LDL 121.  A1c 5.5.  Recommended DAPT for 3 months given right carotid artery dissection and then aspirin alone as well as initiated atorvastatin 40 mg daily.  ? ? ? ? ? ?PERTINENT IMAGING ? ?MR BRAIN WO CONTRAST 01/14/2021 ?IMPRESSION: ?1. Punctate acute infarct in the high left frontoparietal ?perirolandic cortex. ?2. Mild to moderate for age chronic microvascular ischemic disease, ?atrophy, and chronic left cerebellar infarct. ?3. 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 ? ?CTA HEAD/NECK 01/13/2021 ?IMPRESSION: ?1. Linear filling defect in the distal right common carotid artery ?extending to the proximal internal carotid artery suspicious for ?acute dissection, with mixing artifact considered less likely though ?not entirely excluded. Short follow up may be considered to assess ?for persistence. There is less than 50% narrowing  of the true lumen ?of the common and internal carotid arteries. This result was called ?by telephone at the time of interpretation on 01/13/2021 at 3:11 pm ?to provider [facility] , who verbally acknowledged these results. ?2. Scattered atherosclerotic disease throughout the remainder of  the ?vasculature of the head and neck with no other significant stenosis, ?occlusion, dissection, or aneurysm. ?3. Scattered nodular opacities throughout both lung apices most ?suggestive of multifocal infectious/inflammatory etiology. Recommend ?dedicated imaging of the chest or follow-up CT in 1-3 months to ?assess for resolution. ? ? ? ? ? ? ?ROS:   ?14 system review of systems performed and negative with exception of those listed in HPI ? ?PMH:  ?Past Medical History:  ?Diagnosis Date  ? Enlarged prostate   ? Hypertension   ? Renal stones   ? Stroke [facility])   ? ? ?PSH:  ?Past Surgical History:  ?Procedure Laterality Date  ? APPENDECTOMY    ? CHOLECYSTECTOMY    ? LOOP RECORDER INSERTION N/A 01/15/2021  ? Procedure: LOOP RECORDER INSERTION;  Surgeon: Thompson Grayer, MD;  Location: [place] [facility];  Service: Cardiovascular;  Laterality: N/A;  ? NOSE SURGERY    ? SPINE SURGERY    ? compressoin fx T12, kyphoplasty  ? SPLENECTOMY, TOTAL    ? ? ?Social History:  ?Social History  ? ?Socioeconomic History  ? Marital status: Married  ?  Spouse name: Holley Raring  ? Number of children: Not on file  ? Years of education: Not on file  ? Highest education level: Not on file  ?Occupational History  ? Not on file  ?Tobacco Use  ? Smoking status: Former  ?  Packs/day: 1.00  ?  Years: 30.00  ?  Pack years: 30.00  ?  Types: Cigarettes  ?  Quit date: 10/07/1984  ?  Years since quitting: 36.9  ? Smokeless tobacco: Never  ?Substance and Sexual Activity  ? Alcohol use: Never  ? Drug use: Never  ? Sexual activity: Not on file  ?Other Topics Concern  ? Not on file  ?Social History Narrative  ? Lives with wife  ? ?Social Determinants of Health  ? ?Financial Resource Strain: Not on file  ?Food Insecurity: Not on file  ?Transportation Needs: Not on file  ?Physical Activity: Not on file  ?Stress: Not on file  ?Social Connections: Not on file  ?Intimate Partner Violence: Not on file  ? ? ?Family History:  ?Family History  ?Problem Relation Age of  Onset  ? Lung disease Neg Hx   ? ? ?Medications:   ?Current Outpatient Medications on File Prior to Visit  ?Medication Sig Dispense Refill  ? acetaminophen (TYLENOL) 325 MG tablet Take 650 mg by mouth as needed.    ? aspirin EC 81 MG EC tablet Take 1 tablet (81 mg total) by mouth daily. Swallow whole. 30 tablet 11  ? atorvastatin (LIPITOR) 40 MG tablet Take 1 tablet (40 mg total) by mouth daily. 30 tablet 3  ? Naphazoline HCl (CLEAR EYES OP) Place 1 drop into both eyes at bedtime.    ? sodium chloride (OCEAN) 0.65 % nasal spray Place 1 spray into the nose daily as needed for congestion.     ? clopidogrel (PLAVIX) 75 MG tablet Take 1 tablet (75 mg total) by mouth daily. (Patient not taking: Reported on 09/17/2021) 30 tablet 3  ? ?No current facility-administered medications on file prior to visit.  ? ? ?Allergies:   ?Allergies  ?Allergen Reactions  ? Codeine   ?  unknown  ? ? ? ? ?OBJECTIVE: ? ?Physical Exam ? ?Vitals:  ? 09/17/21 1344  ?BP: 132/77  ?Pulse: 79  ?Weight: 137 lb 3.2 oz (62.2 kg)  ?Height: 5\' 7"  (1.702 m)  ? ? ?Body mass index is 21.49 kg/m?Marland Kitchen ?No results found. ? ?General: well developed, well nourished, very pleasant elderly Caucasian male, seated, in no evident distress ?Head: head normocephalic and atraumatic.   ?Neck: supple with no carotid or supraclavicular bruits ?Cardiovascular: regular rate and rhythm, no murmurs ?Musculoskeletal: no deformity ?Skin:  no rash/petichiae ?Vascular:  Normal pulses all extremities ?  ?Neurologic Exam ?Mental Status: Awake and fully alert.  Fluent speech and language.  Oriented to place and time. Recent and remote memory intact. Attention span, concentration and fund of knowledge appropriate. Mood and affect appropriate.  ?Cranial Nerves: Pupils equal, briskly reactive to light. Extraocular movements full without nystagmus. Visual fields full to confrontation.  HOH bilaterally. Facial sensation intact. Face, tongue, palate moves normally and symmetrically.  ?Motor:  Normal bulk and tone. Normal strength in all tested extremity muscles ?Sensory.: intact to touch , pinprick , position and vibratory sensation.  ?Coordination: Rapid alternating movements normal in all extremities. F

## 2021-09-17 NOTE — Patient Instructions (Signed)
Would recommend working with PT once cleared by orthopedics ? ?Restart aspirin 81 mg daily  and atorvastatin for secondary stroke prevention - please follow up with PCP in about 2 months for repeat lab work and ongoing prescribing of atorvastatin ? ?Loop recorder has not shown atrial fibrillation thus far - will continue to be monitored by cardiology ? ?Continue to follow up with PCP regarding cholesterol and blood pressure management  ?Maintain strict control of hypertension with blood pressure goal below 130/90 and cholesterol with LDL cholesterol (bad cholesterol) goal below 70 mg/dL.  ? ?Signs of a Stroke? Follow the BEFAST method:  ?Balance Watch for a sudden loss of balance, trouble with coordination or vertigo ?Eyes Is there a sudden loss of vision in one or both eyes? Or double vision?  ?Face: Ask the person to smile. Does one side of the face droop or is it numb?  ?Arms: Ask the person to raise both arms. Does one arm drift downward? Is there weakness or numbness of a leg? ?Speech: Ask the person to repeat a simple phrase. Does the speech sound slurred/strange? Is the person confused ? ?Time: If you observe any of these signs, call 911. ? ? ? ? ? ? ? ?Thank you for coming to see [place] at [facility]. I hope we have been able to provide you high quality care today. ? ?You may receive a patient satisfaction survey over the next few weeks. We would appreciate your feedback and comments so that we may continue to improve ourselves and the health of our patients. ? ?

## 2021-09-21 ENCOUNTER — Ambulatory Visit (INDEPENDENT_AMBULATORY_CARE_PROVIDER_SITE_OTHER): Payer: Medicare HMO

## 2021-09-21 DIAGNOSIS — I639 Cerebral infarction, unspecified: Secondary | ICD-10-CM | POA: Diagnosis not present

## 2021-10-06 NOTE — Progress Notes (Signed)
Carelink Summary Report / Loop Recorder 

## 2021-10-19 ENCOUNTER — Telehealth: Payer: Self-pay | Admitting: Internal Medicine

## 2021-10-19 DIAGNOSIS — J479 Bronchiectasis, uncomplicated: Secondary | ICD-10-CM

## 2021-10-19 DIAGNOSIS — A439 Nocardiosis, unspecified: Secondary | ICD-10-CM

## 2021-10-19 NOTE — Telephone Encounter (Signed)
Patient's wife, [name](DPR) is aware of below message and voiced her understanding.  Appt scheduled 10/22/2021 at 11:00. Nothing further needed.

## 2021-10-19 NOTE — Telephone Encounter (Signed)
He does not need a referral to ID since he has seen them in the past, but I think he does need to see me first - overdue for follow up and I need to assess if he has an infection that could be contributing first.

## 2021-10-19 NOTE — Telephone Encounter (Signed)
I called the patient and the wife answered the phone and she reports that he was having some new symptoms. He has lost 10 pounds in the last 3 weeks. He is having headaches across the top of his head. Please advise if he needs a OV before the referral to the Infectious disease? Last ov was 05/2020.

## 2021-10-22 ENCOUNTER — Ambulatory Visit: Payer: Medicare HMO | Admitting: Internal Medicine

## 2021-10-22 ENCOUNTER — Encounter: Payer: Self-pay | Admitting: Internal Medicine

## 2021-10-22 VITALS — BP 128/72 | HR 70 | Temp 98.2°F | Ht 67.0 in | Wt 121.0 lb

## 2021-10-22 DIAGNOSIS — J479 Bronchiectasis, uncomplicated: Secondary | ICD-10-CM

## 2021-10-22 DIAGNOSIS — Z8619 Personal history of other infectious and parasitic diseases: Secondary | ICD-10-CM

## 2021-10-22 DIAGNOSIS — R519 Headache, unspecified: Secondary | ICD-10-CM | POA: Diagnosis not present

## 2021-10-22 DIAGNOSIS — S0990XS Unspecified injury of head, sequela: Secondary | ICD-10-CM

## 2021-10-22 NOTE — Progress Notes (Signed)
I agree  headache by itself doesn't necessarily mean recurrence of nocardiosis/warrant CNS imaging when low concerns clinically and could be multifactorial.

## 2021-10-22 NOTE — Progress Notes (Signed)
Joseh Sjogren    [id number]    Jul 24, 1930  Primary Care Physician:[facility] Date of Appointment: 10/22/2021 Established Patient Visit  Chief complaint:   Chief Complaint  Patient presents with   Follow-up    Pt is here for follow up. Pt state no issues with his breathing so far since last year.      HPI: Samuel Mata is a 88 y.o. gentleman with bronchiectasis and pulmonary nocardiosis.   Interval Updates: Here for follow up after a couple of years.  Had a stroke in August 2022 and also had a fall and compression fracture s/p surgical repair.  Since all this he is more unsteady on his feet.   He called in due to 10 lb weight loss - but now says there was an error on the scale and has only lost 1 lb.   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   Breathing is doing ok. Minimal coughing. Uses nasal saline for irrigation  He is having worsening headaches over the last few months. Relieved with tylenol. Headaches are less than once/week.  No vision changes, no intractable headache. Hasn't actually had one in several days.    I have reviewed the patient's family social and past medical history and updated as appropriate.   Past Medical History:  Diagnosis Date   Enlarged prostate    Hypertension    Renal stones    Stroke [facility])     Past Surgical History:  Procedure Laterality Date   APPENDECTOMY     CHOLECYSTECTOMY     LOOP RECORDER INSERTION N/A 01/15/2021   Procedure: LOOP RECORDER INSERTION;  Surgeon: Hillis Range, MD;  Location: [facility] INV[facility] CV [facility];  Service: Cardiovascular;  Laterality: N/A;   NOSE SURGERY     SPINE SURGERY     compressoin fx T12, kyphoplasty   SPLENECTOMY, TOTAL      Family History  Problem Relation Age of Onset   Lung disease Neg Hx     Social History   Occupational History   Not on file  Tobacco Use   Smoking status: Former    Packs/day: 1.00    Years: 30.00    Total pack  years: 30.00    Types: Cigarettes    Quit date: 10/07/1984    Years since quitting: 37.0   Smokeless tobacco: Never  Substance and Sexual Activity   Alcohol use: Never   Drug use: Never   Sexual activity: Not on file     Physical Exam: Blood pressure 128/72, pulse 70, temperature 98.2 F (36.8 C), temperature source Oral, height 5\' 7"  (1.702 m), weight 121 lb (54.9 kg), SpO2 98 %.  Gen:      NAD, elderly, with cane Lungs:   diminished, no wheezes or crackles CV:        RRR no mrg   Data Reviewed: Imaging: I have personally reviewed the CT Chest done at [facility] reviewed personally - consistent with nodular and tree in bud opacities concerning for MAI. Also hyperinflation and some centrilobular emphysema. Given to upload to PACS on 5/27.   PFTs: Mild airflow limitation without a BD response on today's PFTs.  Labs:  Immunization status: Immunization History  Administered Date(s) Administered   Fluad Quad(high Dose 65+) 02/07/2020   Influenza Inj Mdck Quad With Preservative 02/17/2018   Influenza Split 03/17/2004   Influenza, High Dose Seasonal PF 02/15/2019   Influenza, Seasonal, Injecte, Preservative Fre 02/28/2013, 03/07/2014  Influenza-Unspecified 04/16/2001, 02/14/2002, 03/07/2003, 03/17/2004   PFIZER(Purple Top)SARS-COV-2 Vaccination 06/13/2019, 06/26/2019, 02/15/2020   Pneumococcal Conjugate-13 12/21/2013   Pneumococcal Polysaccharide-23 05/17/2002, 09/15/2002, 05/20/2020   Pneumococcal-Unspecified 04/16/2000, 09/15/2002   Td 05/17/2002   Tdap 06/10/2017   Zoster Recombinat (Shingrix) 01/26/2014    Assessment:  Chronic bronchitis with emphysema and Bronchiectasis - controlled History of pseudomonas and nocardiosis pulmonary infection headaches  Plan/Recommendations: Doing well off inhaler therapies and airway clearance for his mild bronchiectasis. Essentially no respiratory symptoms.  I do not feel there is any evidence of active pulmonary nocardiosis.  No  constituional symptoms.  He is having headaches which could certainly be multifactorial in etiology. I've asked him to follow up with PCP on this. To me the headaches were of a reassuring pattern and nature.  He is concerned he will have to see ID again because of the headaches due to history of nocardiosis. I think less likely he has CNS nocardiosis based on headache alone, but will reach out and see if they have other thoughts.   I spent 30 minutes in the care of this patient today including pre-charting, chart review, review of results, face-to-face care, coordination of care and communication with consultants etc.).   Return to Care: Return in about 1 year (around 10/23/2022).   Durel Salts, MD Pulmonary and Critical Care Medicine [facility] Office:[phone]

## 2021-10-22 NOTE — Patient Instructions (Addendum)
Please schedule follow up scheduled with myself in 1 year.  If my schedule is not open yet, we will contact you with a reminder closer to that time. Please call [phone] if you haven't heard from [place] a month before.   I will reach out to the infectious disease doctors and let them know about your headaches. If they are concerned I will call you and let you know. Otherwise no news is good news.   Follow up with your primary care doctor about headaches.   Call me sooner if any problems with your breathing and I will get you in.

## 2021-10-26 ENCOUNTER — Ambulatory Visit (INDEPENDENT_AMBULATORY_CARE_PROVIDER_SITE_OTHER): Payer: Medicare HMO

## 2021-10-26 DIAGNOSIS — I639 Cerebral infarction, unspecified: Secondary | ICD-10-CM | POA: Diagnosis not present

## 2021-10-28 LAB — CUP PACEART REMOTE DEVICE CHECK
Date Time Interrogation Session: 20230605230616
Implantable Pulse Generator Implant Date: 20220901

## 2021-11-06 NOTE — Progress Notes (Signed)
Carelink Summary Report / Loop Recorder 

## 2021-11-23 ENCOUNTER — Encounter: Payer: Self-pay | Admitting: Physical Therapy

## 2021-11-23 ENCOUNTER — Ambulatory Visit: Payer: Medicare HMO | Attending: Family Medicine | Admitting: Physical Therapy

## 2021-11-23 ENCOUNTER — Other Ambulatory Visit: Payer: Self-pay

## 2021-11-23 DIAGNOSIS — M6281 Muscle weakness (generalized): Secondary | ICD-10-CM | POA: Insufficient documentation

## 2021-11-23 DIAGNOSIS — M545 Low back pain, unspecified: Secondary | ICD-10-CM | POA: Insufficient documentation

## 2021-11-23 NOTE — Therapy (Signed)
OUTPATIENT PHYSICAL THERAPY EVALUATION   Patient Name: Samuel Mata MRN: [id number] DOB:1931-04-15, 88 y.o., male Today's Date: 11/23/2021   PT End of Session - 11/23/21 1438     Visit Number 1    Number of Visits 8    Date for PT Re-Evaluation 01/18/22    Authorization Type [employer] MCR    Progress Note Due on Visit 10    PT Start Time 1445    PT Stop Time 1530    PT Time Calculation (min) 45 min    Activity Tolerance Patient tolerated treatment well    Behavior During Therapy WFL for tasks assessed/performed             Past Medical History:  Diagnosis Date   Enlarged prostate    Hypertension    Renal stones    Stroke [facility])    Past Surgical History:  Procedure Laterality Date   APPENDECTOMY     CHOLECYSTECTOMY     LOOP RECORDER INSERTION N/A 01/15/2021   Procedure: LOOP RECORDER INSERTION;  Surgeon: Hillis Range, MD;  Location: [facility] CV [facility];  Service: Cardiovascular;  Laterality: N/A;   NOSE SURGERY     SPINE SURGERY     compressoin fx T12, kyphoplasty   SPLENECTOMY, TOTAL     Patient Active Problem List   Diagnosis Date Noted   Acute ischemic stroke [facility])    Carotid dissection, bilateral (HCC)    Aphasia    Immunization due 05/20/2020   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   Medication monitoring encounter 03/07/2020   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   Bronchiectasis without complication (HCC) 11/22/2019   Hereditary spherocytosis (HCC) 08/29/2019   Seborrheic keratoses 08/29/2019   OSA (obstructive sleep apnea) 03/19/2019   History of TIA (transient ischemic attack) 01/23/2018   Hemoptysis 07/15/2017   Pulmonary infiltrates 07/15/2017   History of tobacco use 06/06/2017   Essential hypertension 09/22/2015   Gastroesophageal reflux disease without esophagitis 06/05/2014   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 He did have a compression fracture following the fall, and had a kyphoplasty. He doesn't really have any back pain currently. He uses a cane for all his walking.  PERTINENT HISTORY:  Lumbar compression fracture sustained 08/24/2021 following fall, lumbar kyphoplasty 09/11/2021, history of stroke and carotid artery dissection in 12/2020  PAIN:  Are you having pain? No  PRECAUTIONS: Fall  WEIGHT BEARING RESTRICTIONS No  FALLS:  Has patient fallen in last 6 months? Yes. Number of falls 1, in April  LIVING ENVIRONMENT: Lives with: lives with their spouse Lives in: House/apartment Stairs: No Has following equipment at home: Single point cane  OCCUPATION: Retired  PLOF: Independent  PATIENT GOALS: Improve balance and steadiness    OBJECTIVE:  PATIENT SURVEYS:  FOTO 54% functional status  SCREENING FOR RED FLAGS: Negative  COGNITION: Overall cognitive status: Within functional limits for tasks assessed     SENSATION: WFL  POSTURE:   Rounded shoulder and forward head posture  PALPATION: Non  tender to palpation  LOWER EXTREMITY MMT:    MMT Right eval Left eval  Hip flexion 4- 4-  Hip extension 3 3  Hip abduction 3 3  Knee flexion 4+ 4+  Knee extension 4+ 4+   FUNCTIONAL TESTS:  5 times sit to stand: 13 seconds Timed up and go (TUG): 16 seconds 2 minute walk test: 350 ft Berg Balance Scale: 39/56  BERG BALANCE TEST Sitting to Standing: 4.      Stands without using hands and stabilize independently Standing Unsupported: 3.      Stands 2  minutes with supervision Sitting Unsupported: 4.     Sits for 2 minutes independently Standing to Sitting: 4.     Sits safely with minimal use of hands Transfers: 4.     Transfers safely with minor use of hands Standing with eyes closed: 3.     Stands 10 seconds with supervision Standing with feet together: 3.     Stands for 1 minute with supervision Reaching forward with outstretched arm: 3.     Reaches forward 5 inches Retrieving object from the floor: 3.     Able to pick up with supervision Turning to look behind: 2.     Turns sideways only, maintains balance Turning 360 degrees: 2.     Able to turn slowly, but safely Place alternate foot on stool: 2.     4 steps without assistance/supervision Standing with one foot in front: 1.     Needs help to step, but can hold for 15 seconds Standing on one foot: 1.     Holds <3 seconds Total Score: 39/56  GAIT: Distance walked: 350 ft Assistive device utilized: Single point cane Level of assistance: Modified independence Comments: wide BOS with bilateral toe out, decreased trunk rotation and arm swing, occasionally unsteady gait   TODAY'S TREATMENT  Sit to stand x 10 Standing hip abduction x 15 each Standing hip extension x 15 each Standing heel toe raises x 15 Tandem stance x 30 sec each   PATIENT EDUCATION:  Education details: Exam findings, POC, HEP Person educated: Patient Education method: Explanation, Demonstration, Tactile cues, Verbal cues, and Handouts Education comprehension: verbalized understanding, returned demonstration, verbal cues required, tactile cues required, and needs further education  HOME EXERCISE PROGRAM: Access Code: [email or web address]   ASSESSMENT: CLINICAL IMPRESSION: Patient is a 88 y.o. male who was seen today for physical therapy evaluation and treatment for balance and gait impairments. He has history of stroke in 12/2020 and fall resulting in lumbar compression fracture and subsequent kyphoplasty in 08/2021.  Currently he does exhibit gross LE weakness and balance measures are positive for increased fall risk. Patient is currently ambulating using a SPC and his walking ability is below age matched norms.   OBJECTIVE IMPAIRMENTS Abnormal gait, decreased activity tolerance, decreased balance, decreased strength, postural dysfunction, and pain.   ACTIVITY LIMITATIONS standing, squatting, stairs, and locomotion level  PARTICIPATION LIMITATIONS: cleaning, laundry, shopping, and community activity  PERSONAL FACTORS Age, Fitness, Past/current experiences, Time since onset of injury/illness/exacerbation, and 3+ comorbidities: Lumbar compression fracture sustained 08/24/2021 following fall, lumbar kyphoplasty 09/11/2021, history of stroke and carotid artery dissection in 12/2020  are also affecting patient's functional outcome.   REHAB POTENTIAL: Good  CLINICAL DECISION MAKING: Stable/uncomplicated  EVALUATION COMPLEXITY: Low   GOALS: Goals reviewed with patient? Yes  SHORT TERM GOALS: Target date: 12/21/2021  Patient will be I with initial HEP in order to progress with therapy. Baseline: HEP privid Goal status: INITIAL  2.  PT will review FOTO with patient by 3rd visit in order to understand expected progress and outcome with therapy. Baseline: FOTO assessed at eval Goal status: INITIAL  3.  Patient will demonstrate 5xSTS </= 11 seconds to indicate improved LE strength and reduced fall risk. Baseline: 13 seconds Goal status: INITIAL  LONG TERM GOALS: Target date: 01/18/2022  Patient will be I with final HEP to maintain progress from PT. Baseline:  Goal status: INITIAL  2.  Patient will report >/= 58% status on FOTO to indicate improved functional ability. Baseline: 54% Goal status: INITIAL  3.  Patient will demonstrate improved knee strength = 5/5 MMT and hip strength >/= 4/5 MMT to improve walking stability. Baseline: Knee strength grossly 4+/5 MMT, hip strength grossly 3/5 MMT Goal  status: INITIAL  4.  Patient will perform using SPC >/= 473 ft in order to improve community access. Baseline: 350 ft using SPC Goal status: INITIAL  5.  Patient will exhibit TUG in </= 11 seconds in order to reduce fall risk. Baseline: 16 seconds Goal status: INITIAL   PLAN: PT FREQUENCY: 1-2x/week  PT DURATION: 8 weeks  PLANNED INTERVENTIONS: Therapeutic exercises, Therapeutic activity, Neuromuscular re-education, Balance training, Gait training, Patient/Family education, Joint manipulation, Stair training, Aquatic Therapy, Dry Needling, Manual therapy, and Re-evaluation.  PLAN FOR NEXT SESSION: Review HEP and progress PRN, progress LE strengthening, balance training   Rosana Hoes, PT, DPT, LAT, ATC 11/23/21  4:57 PM Phone: [phone] Fax: [phone]

## 2021-11-23 NOTE — Patient Instructions (Signed)
Access Code: [email or web address] URL: [email or web address] Date: 11/23/2021 Prepared by: Rosana Hoes  Exercises - Sit to Stand Without Arm Support  - 1 x daily - 3 sets - 10 reps - Standing Hip Abduction with Counter Support  - 1 x daily - 2 sets - 15 reps - Standing Hip Extension with Counter Support  - 1 x daily - 2 sets - 15 reps - Heel Toe Raises with Counter Support  - 1 x daily - 2 sets - 15 reps - Standing Tandem Balance with Counter Support  - 1 x daily - 3 reps - 30 seconds hold

## 2021-11-26 LAB — CUP PACEART REMOTE DEVICE CHECK
Date Time Interrogation Session: 20230708230624
Implantable Pulse Generator Implant Date: 20220901

## 2021-11-30 ENCOUNTER — Ambulatory Visit (INDEPENDENT_AMBULATORY_CARE_PROVIDER_SITE_OTHER): Payer: Medicare HMO

## 2021-11-30 DIAGNOSIS — I639 Cerebral infarction, unspecified: Secondary | ICD-10-CM

## 2021-12-02 ENCOUNTER — Ambulatory Visit: Payer: Medicare HMO | Admitting: Physical Therapy

## 2021-12-02 DIAGNOSIS — M6281 Muscle weakness (generalized): Secondary | ICD-10-CM | POA: Diagnosis not present

## 2021-12-02 NOTE — Therapy (Signed)
OUTPATIENT PHYSICAL THERAPY TREATMENT NOTE   Patient Name: Samuel Mata MRN: [id number] DOB:Mar 10, 1931, 88 y.o., male Today's Date: 12/02/2021  PCP: [facility]   REFERRING PROVIDER: Eartha Inch, MD  END OF SESSION:   PT End of Session - 12/02/21 1323     Visit Number 2    Number of Visits 8    Date for PT Re-Evaluation 01/18/22    Authorization Type Aetna MCR    Progress Note Due on Visit 10    PT Start Time 1315    PT Stop Time 1400    PT Time Calculation (min) 45 min             Past Medical History:  Diagnosis Date   Enlarged prostate    Hypertension    Renal stones    Stroke 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   Medication monitoring encounter 03/07/2020   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   Bronchiectasis without complication (HCC) 11/22/2019   Hereditary spherocytosis (HCC) 08/29/2019   Seborrheic keratoses 08/29/2019   OSA (obstructive sleep apnea) 03/19/2019   History of TIA (transient ischemic attack) 01/23/2018   Hemoptysis 07/15/2017   Pulmonary infiltrates 07/15/2017   History of tobacco use 06/06/2017   Essential hypertension 09/22/2015   Gastroesophageal reflux disease without esophagitis 06/05/2014   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 The doctor told me to be careful with my back.   PAIN:  Are you having pain? No   OBJECTIVE: (objective measures completed at initial evaluation unless otherwise dated)   PATIENT SURVEYS:  FOTO 54% functional status   SCREENING FOR RED FLAGS: Negative   COGNITION: Overall cognitive status: Within functional limits for tasks assessed                          SENSATION: WFL   POSTURE:            Rounded shoulder and forward head posture   PALPATION: Non tender to palpation   LOWER EXTREMITY MMT:     MMT Right eval Left eval  Hip flexion 4- 4-  Hip extension 3 3  Hip abduction 3 3  Knee flexion 4+ 4+  Knee extension 4+ 4+    FUNCTIONAL TESTS:  5 times sit to stand: 13 seconds Timed up and go (TUG): 16 seconds  2 minute walk test: 350 ft Berg Balance Scale: 39/56   BERG BALANCE TEST Sitting to Standing: 4.      Stands without using hands and stabilize independently Standing Unsupported: 3.      Stands 2 minutes with supervision Sitting Unsupported: 4.     Sits for 2 minutes independently Standing to Sitting: 4.     Sits safely with minimal use of hands Transfers: 4.     Transfers safely with minor use of hands Standing with eyes closed: 3.     Stands 10 seconds with supervision Standing with feet together: 3.     Stands for 1 minute with supervision Reaching forward with outstretched arm: 3.     Reaches forward 5 inches Retrieving object from the floor: 3.     Able to pick up with supervision Turning to look behind: 2.     Turns sideways only,  maintains balance Turning 360 degrees: 2.     Able to turn slowly, but safely Place alternate foot on stool: 2.     4 steps without assistance/supervision Standing with one foot in front: 1.     Needs help to step, but can hold for 15 seconds Standing on one foot: 1.     Holds <3 seconds Total Score: 39/56   GAIT: Distance walked: 350 ft Assistive device utilized: Single point cane Level of assistance: Modified independence Comments: wide BOS with bilateral toe out, decreased trunk rotation and arm swing, occasionally unsteady gait     TODAY'S TREATMENT  [facility] Adult PT Treatment:                                                DATE: 12/02/21 Therapeutic Exercise: Nustep L5 x 5 minutes  Heel/toe raises  Hip abduction x 15 each Hip ext x 15 each  Blue rocker board A/P weight shifting ,progressed to no UE.  Red band clam 15 x 2  Bridge x 15 STS from elevated chair-2 pillows, cues for weight shift to avoid fall back   Neuromuscular re-ed: Tandem stance 45 sec x 2 Airex pad wide with head turns, nods, narrow with head turns and nods    INITIAL TREATMENT: Sit to stand x 10 Standing hip abduction x 15 each Standing hip extension x 15 each Standing heel toe raises x 15 Tandem stance x 30 sec each     PATIENT EDUCATION:  Education details: Exam findings, POC, HEP Person educated: Patient Education method: Explanation, Demonstration, Tactile cues, Verbal cues, and Handouts Education comprehension: verbalized understanding, returned demonstration, verbal cues required, tactile cues required, and needs further education   HOME EXERCISE PROGRAM: Access Code: [email or web address] URL: [email or web address] Date: 12/02/2021 Prepared by: Jannette Spanner  Exercises - Sit to Stand Without Arm Support  - 1 x daily - 3 sets - 10 reps - Standing Hip Abduction with Counter Support  - 1 x daily - 2 sets - 15 reps - Standing Hip Extension with Counter Support  - 1 x daily - 2 sets - 15  reps - Heel Toe Raises with Counter Support  - 1 x daily - 2 sets - 15 reps - Standing Tandem Balance with Counter Support  - 1 x daily - 3 reps - 30 seconds hold     ASSESSMENT: CLINICAL IMPRESSION: Patient is a 88 y.o. male who was seen today for physical  therapy treatment for balance and gait impairments. He has history of stroke in 12/2020 and fall resulting in lumbar compression fracture and subsequent kyphoplasty in 08/2021. Pt reports increased back pain with STS exercise. Modified exercise to elevated seated using pillows and worked on weight shifting to avoid LOB backward. He did well with this and agreed to continue for HEP. Continued balance exercises and LE strengthening which were tolerated well without c/o pain.      OBJECTIVE IMPAIRMENTS Abnormal gait, decreased activity tolerance, decreased balance, decreased strength, postural dysfunction, and pain.    ACTIVITY LIMITATIONS standing, squatting, stairs, and locomotion level   PARTICIPATION LIMITATIONS: cleaning, laundry, shopping, and community activity   PERSONAL FACTORS Age, Fitness, Past/current experiences, Time since onset of injury/illness/exacerbation, and 3+ comorbidities: Lumbar compression fracture sustained 08/24/2021 following fall, lumbar kyphoplasty 09/11/2021, history of stroke and carotid artery dissection in 12/2020  are also affecting patient's functional outcome.    REHAB POTENTIAL: Good   CLINICAL DECISION MAKING: Stable/uncomplicated   EVALUATION COMPLEXITY: Low     GOALS: Goals reviewed with patient? Yes   SHORT TERM GOALS: Target date: 12/21/2021   Patient will be I with initial HEP in order to progress with therapy. Baseline: HEP privid Goal status: INITIAL   2.  PT will review FOTO with patient by 3rd visit in order to understand expected progress and outcome with therapy. Baseline: FOTO assessed at eval Goal status: INITIAL   3.  Patient will demonstrate 5xSTS </= 11 seconds to indicate improved  LE strength and reduced fall risk. Baseline: 13 seconds Goal status: INITIAL   LONG TERM GOALS: Target date: 01/18/2022   Patient will be I with final HEP to maintain progress from PT. Baseline:  Goal status: INITIAL   2.  Patient will report >/= 58% status on FOTO to indicate improved functional ability. Baseline: 54% Goal status: INITIAL   3.  Patient will demonstrate improved knee strength = 5/5 MMT and hip strength >/= 4/5 MMT to improve walking stability. Baseline: Knee strength grossly 4+/5 MMT, hip strength grossly 3/5 MMT Goal status: INITIAL   4.  Patient will perform using SPC >/= 473 ft in order to improve community access. Baseline: 350 ft using SPC Goal status: INITIAL   5.  Patient will exhibit TUG in </= 11 seconds in order to reduce fall risk. Baseline: 16 seconds Goal status: INITIAL     PLAN: PT FREQUENCY: 1-2x/week   PT DURATION: 8 weeks   PLANNED INTERVENTIONS: Therapeutic exercises, Therapeutic activity, Neuromuscular re-education, Balance training, Gait training, Patient/Family education, Joint manipulation, Stair training, Aquatic Therapy, Dry Needling, Manual therapy, and Re-evaluation.   PLAN FOR NEXT SESSION: Review HEP and progress PRN, progress LE strengthening, balance training      Jannette Spanner, PTA 12/02/21 2:09 PM Phone: [phone] Fax: [phone]

## 2021-12-08 NOTE — Therapy (Signed)
OUTPATIENT PHYSICAL THERAPY TREATMENT NOTE   Patient Name: Samuel Mata MRN: [id number] DOB:[birth year], 88 y.o., male Today's Date: 12/09/2021  PCP: [facility]   REFERRING PROVIDER: Eartha Inch, MD   END OF SESSION:   PT End of Session - 12/09/21 1432     Visit Number 3    Number of Visits 8    Date for PT Re-Evaluation 01/18/22    Authorization Type [employer] MCR    Progress Note Due on Visit 10    PT Start Time 1435    PT Stop Time 1515    PT Time Calculation (min) 40 min    Activity Tolerance Patient tolerated treatment well    Behavior During Therapy WFL for tasks assessed/performed              Past Medical History:  Diagnosis Date   Enlarged prostate    Hypertension    Renal stones    Stroke [facility])    Past Surgical History:  Procedure Laterality Date   APPENDECTOMY     CHOLECYSTECTOMY     LOOP RECORDER INSERTION N/A 01/15/2021   Procedure: LOOP RECORDER INSERTION;  Surgeon: Hillis Range, MD;  Location: [facility] INV[facility] CV LAB;  Service: Cardiovascular;  Laterality: N/A;   NOSE SURGERY     SPINE SURGERY     compressoin fx T12, kyphoplasty   SPLENECTOMY, TOTAL     Patient Active Problem List   Diagnosis Date Noted   Acute ischemic stroke [facility])    Carotid dissection, bilateral (HCC)    Aphasia    Immunization due 05/20/2020   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   Medication monitoring encounter 03/07/2020   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   Bronchiectasis without complication (HCC) 11/22/2019   Hereditary spherocytosis (HCC) 08/29/2019   Seborrheic keratoses 08/29/2019   OSA (obstructive sleep apnea) 03/19/2019   History of TIA (transient ischemic attack) 01/23/2018   Hemoptysis 07/15/2017   Pulmonary infiltrates 07/15/2017   History of tobacco use 06/06/2017   Essential hypertension 09/22/2015   Gastroesophageal reflux disease without esophagitis 06/05/2014   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   Chronic obstructive  pulmonary disease (HCC) 08/14/2013   Chronic rhinitis 08/14/2013   Hearing loss of both ears 08/14/2013    REFERRING DIAG: Weakness, Lumbar back pain   THERAPY DIAG:  Muscle weakness (generalized)  Rationale for Evaluation and Treatment Rehabilitation  PERTINENT HISTORY: Fall   PRECAUTIONS: Lumbar compression fracture sustained 08/24/2021 following fall, lumbar kyphoplasty 09/11/2021, history of stroke and carotid artery dissection in 12/2020    SUBJECTIVE: Patient reports he has gotten better at standing and sitting, he has been working on scooting to the edge of the chair and leaning forward before he stands. His back is not bothering him.  PAIN:  Are you having pain? No   OBJECTIVE: (objective measures completed at initial evaluation unless otherwise dated) PATIENT SURVEYS:  FOTO 54% functional status   POSTURE:            Rounded shoulder and forward head posture   LOWER EXTREMITY MMT:     MMT Right eval Left eval Rt / Lt  Hip flexion 4- 4-   Hip extension 3 3   Hip abduction 3 3 3+ / 3+  Knee flexion 4+ 4+   Knee extension 4+ 4+     FUNCTIONAL TESTS:  5 times sit to stand: 13 seconds Timed up and go (TUG): 16 seconds 2 minute walk test: 350 ft Berg Balance Scale: 39/56    GAIT: Distance  walked: 350 ft Assistive device utilized: Single point cane Level of assistance: Modified independence Comments: wide BOS with bilateral toe out, decreased trunk rotation and arm swing, occasionally unsteady gait     TODAY'S TREATMENT  [facility] Adult PT Treatment:                                                DATE: 12/09/21 Therapeutic Exercise: Nustep L5 x 5 minutes with UE/LE while taking subjective Heel/toe raises 2 x 20 Hip abduction with yellow at knees 2 x 15 each Hip extension with yellow at knees 2 x 15 each  Forward step-up 8" box x 10 each with single UE support Sit to stand with Airex in chair 2 x 10 Lateral step-up and over 8" box x 5 with bilateral UE  support Knee extension machine 20# 2 x 10 Knee flexion machine 20# 2 x 10 Neuromuscular re-ed: Tandem stance 3 x 30 sec each   [facility] Adult PT Treatment:                                                DATE: 12/02/21 Therapeutic Exercise: Nustep L5 x 5 minutes  Heel/toe raises  Hip abduction x 15 each Hip ext x 15 each  Blue rocker board A/P weight shifting ,progressed to no UE.  Red band clam 15 x 2  Bridge x 15 STS from elevated chair-2 pillows, cues for weight shift to avoid fall back Neuromuscular re-ed: Tandem stance 45 sec x 2 Airex pad wide with head turns, nods, narrow with head turns and nods  [facility] Adult PT Treatment:                                                DATE: 11/23/21 Therapeutic Exercise: Sit to stand x 10 Standing hip abduction x 15 each Standing hip extension x 15 each Standing heel toe raises x 15 Tandem stance x 30 sec each   PATIENT EDUCATION:  Education details: HEP progression with yellow band Person educated: Patient Education method: Explanation, Demonstration, Tactile cues, Verbal cues, Handouts Education comprehension: verbalized understanding, returned demonstration, verbal cues required, tactile cues required, and needs further education   HOME EXERCISE PROGRAM: Access Code: [email or web address]     ASSESSMENT: CLINICAL IMPRESSION: Patient tolerated therapy well with no adverse effects. He does exhibit improved hip strength this visit. Therapy focused primarily on strengthening this visit with progressions in resistance and incorporating step-ups with good tolerance. He was able to demonstrate improve sit to stand form this visit without using UE support. He does require UE support with step-ups and occasionally with tandem stance balance training. Patient provided with yellow band to add in for HEP. Patient would benefit from continued skilled PT to progress his mobility and strength in order to reduce pain and maximize functional ability.     OBJECTIVE  IMPAIRMENTS Abnormal gait, decreased activity tolerance, decreased balance, decreased strength, postural dysfunction, and pain.    ACTIVITY LIMITATIONS standing, squatting, stairs, and locomotion level   PARTICIPATION LIMITATIONS: cleaning, laundry, shopping, and community activity   PERSONAL FACTORS Age, Fitness, Past/current  experiences, Time since onset of injury/illness/exacerbation, and 3+ comorbidities: Lumbar compression fracture sustained 08/24/2021 following fall, lumbar kyphoplasty 09/11/2021, history of stroke and carotid artery dissection in 12/2020  are also affecting patient's functional outcome.      GOALS: Goals reviewed with patient? Yes   SHORT TERM GOALS: Target date: 12/21/2021   Patient will be I with initial HEP in order to progress with therapy. Baseline: HEP provided at evaluation Goal status: INITIAL   2.  PT will review FOTO with patient by 3rd visit in order to understand expected progress and outcome with therapy. Baseline: FOTO assessed at eval Goal status: INITIAL   3.  Patient will demonstrate 5xSTS </= 11 seconds to indicate improved LE strength and reduced fall risk. Baseline: 13 seconds Goal status: INITIAL   LONG TERM GOALS: Target date: 01/18/2022   Patient will be I with final HEP to maintain progress from PT. Baseline:  Goal status: INITIAL   2.  Patient will report >/= 58% status on FOTO to indicate improved functional ability. Baseline: 54% Goal status: INITIAL   3.  Patient will demonstrate improved knee strength = 5/5 MMT and hip strength >/= 4/5 MMT to improve walking stability. Baseline: Knee strength grossly 4+/5 MMT, hip strength grossly 3/5 MMT Goal status: INITIAL   4.  Patient will perform using SPC >/= 473 ft in order to improve community access. Baseline: 350 ft using SPC Goal status: INITIAL   5.  Patient will exhibit TUG in </= 11 seconds in order to reduce fall risk. Baseline: 16 seconds Goal status: INITIAL      PLAN: PT FREQUENCY: 1-2x/week   PT DURATION: 8 weeks   PLANNED INTERVENTIONS: Therapeutic exercises, Therapeutic activity, Neuromuscular re-education, Balance training, Gait training, Patient/Family education, Joint manipulation, Stair training, Aquatic Therapy, Dry Needling, Manual therapy, and Re-evaluation.   PLAN FOR NEXT SESSION: Review HEP and progress PRN, progress LE strengthening, balance training      Rosana Hoes, PT, DPT, LAT, ATC 12/09/21  3:25 PM Phone: [phone] Fax: [phone]

## 2021-12-09 ENCOUNTER — Ambulatory Visit: Payer: Medicare HMO | Admitting: Physical Therapy

## 2021-12-09 ENCOUNTER — Encounter: Payer: Self-pay | Admitting: Physical Therapy

## 2021-12-09 ENCOUNTER — Other Ambulatory Visit: Payer: Self-pay

## 2021-12-09 DIAGNOSIS — M6281 Muscle weakness (generalized): Secondary | ICD-10-CM

## 2021-12-09 NOTE — Patient Instructions (Signed)
Access Code: [email or web address] URL: [email or web address] Date: 12/09/2021 Prepared by: Rosana Hoes  Exercises - Sit to Stand Without Arm Support  - 1 x daily - 3 sets - 10 reps - Standing Hip Abduction with Resistance at Ankles and Counter Support  - 1 x daily - 2 sets - 15 reps - Standing Hip Extension with Resistance at Ankles and Counter Support  - 1 x daily - 2 sets - 15 reps - Heel Toe Raises with Counter Support  - 1 x daily - 2 sets - 15 reps - Standing Tandem Balance with Counter Support  - 1 x daily - 3 reps - 30 seconds hold

## 2021-12-14 ENCOUNTER — Telehealth: Payer: Self-pay

## 2021-12-14 NOTE — Telephone Encounter (Signed)
Patient called office today requesting appointment. States that he has been having headaches and is concerns his infection has spread to his head. Advised patient to schedule appointment with PCP for evaluation. Per Dr. Elinor Parkinson okay to schedule appointment with office. Juanita Laster, RMA

## 2021-12-16 ENCOUNTER — Ambulatory Visit: Payer: Medicare HMO | Attending: Family Medicine | Admitting: Physical Therapy

## 2021-12-16 ENCOUNTER — Encounter: Payer: Self-pay | Admitting: Physical Therapy

## 2021-12-16 DIAGNOSIS — M6281 Muscle weakness (generalized): Secondary | ICD-10-CM | POA: Diagnosis present

## 2021-12-16 NOTE — Therapy (Signed)
OUTPATIENT PHYSICAL THERAPY TREATMENT NOTE   Patient Name: Samuel Mata MRN: [id number] DOB:1930/10/15, 88 y.o., male Today's Date: 12/16/2021  PCP: [facility]   REFERRING PROVIDER: Eartha Inch, MD   END OF SESSION:   PT End of Session - 12/16/21 1320     Visit Number 4    Number of Visits 8    Date for PT Re-Evaluation 01/18/22    Authorization Type Aet[employer] MCR    Progress Note Due on Visit 10    PT Start Time 1315    PT Stop Time 1357    PT Time Calculation (min) 42 min              Past Medical History:  Diagnosis Date   Enlarged prostate    Hypertension    Renal stones    Stroke [facility])    Past Surgical History:  Procedure Laterality Date   APPENDECTOMY     CHOLECYSTECTOMY     LOOP RECORDER INSERTION N/A 01/15/2021   Procedure: LOOP RECORDER INSERTION;  Surgeon: Hillis Range, MD;  Location: [facility] INVASIVE CV LAB;  Service: Cardiovascular;  Laterality: N/A;   NOSE SURGERY     SPINE SURGERY     compressoin fx T12, kyphoplasty   SPLENECTOMY, TOTAL     Patient Active Problem List   Diagnosis Date Noted   Acute ischemic stroke [facility])    Carotid dissection, bilateral (HCC)    Aphasia    Immunization due 05/20/2020   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   Medication monitoring encounter 03/07/2020   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   Bronchiectasis without complication (HCC) 11/22/2019   Hereditary spherocytosis (HCC) 08/29/2019   Seborrheic keratoses 08/29/2019   OSA (obstructive sleep apnea) 03/19/2019   History of TIA (transient ischemic attack) 01/23/2018   Hemoptysis 07/15/2017   Pulmonary infiltrates 07/15/2017   History of tobacco use 06/06/2017   Essential hypertension 09/22/2015   Gastroesophageal reflux disease without esophagitis 06/05/2014   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   Chronic obstructive pulmonary disease (HCC) 08/14/2013   Chronic rhinitis 08/14/2013   Hearing loss of both ears 08/14/2013     REFERRING DIAG: Weakness, Lumbar back pain   THERAPY DIAG: Muscle weakness (generalized)  Rationale for Evaluation and Treatment Rehabilitation  PERTINENT HISTORY: Fall   PRECAUTIONS: Lumbar compression fracture sustained 08/24/2021 following fall, lumbar kyphoplasty 09/11/2021, history of stroke and carotid artery dissection in 12/2020    SUBJECTIVE: Patient reports he has gotten better at standing and sitting, he has been working on scooting to the edge of the chair and leaning forward before he stands. His back is not bothering him.  PAIN:  Are you having pain? No   OBJECTIVE: (objective measures completed at initial evaluation unless otherwise dated) PATIENT SURVEYS:  FOTO 54% functional status   POSTURE:            Rounded shoulder and forward head posture   LOWER EXTREMITY MMT:     MMT Right eval Left eval Rt / Lt 12/09/21  Hip flexion 4- 4-   Hip extension 3 3   Hip abduction 3 3 3+ / 3+  Knee flexion 4+ 4+   Knee extension 4+ 4+     FUNCTIONAL TESTS:  5 times sit to stand: 13 seconds Timed up and go (TUG): 16 seconds 2 minute walk test: 350 ft Berg Balance Scale: 39/56    GAIT: Distance walked: 350 ft Assistive device utilized: Single point cane Level of assistance: Modified independence Comments: wide BOS with bilateral  toe out, decreased trunk rotation and arm swing, occasionally unsteady gait     TODAY'S TREATMENT  [facility] Adult PT Treatment:                                                DATE: 12/16/21 Therapeutic Exercise: Nustep L5 x 5 minutes with UE/LE while taking subjective Heel/toe raises 2 x 20 Hip abduction with yellow at ankles 2 x 10 each Hip extension with yellow at ankles 2 x 10 each  Sit to stand with Airex in chair 2 x 10 Bridge 10 x 2  Hooklying clam Green x 20 SLR 10 x 2  Hip abduction 10 x 2  Neuromuscular re-ed: Tandem stance 3 x 30 sec each- intermittent UE  Airex- narrow stance with head turns, narrow stance with EC  [facility]  Adult PT Treatment:                                                DATE: 12/09/21 Therapeutic Exercise: Nustep L5 x 5 minutes with UE/LE while taking subjective Heel/toe raises 2 x 20 Hip abduction with yellow at knees 2 x 15 each Hip extension with yellow at knees 2 x 15 each  Forward step-up 8" box x 10 each with single UE support Sit to stand with Airex in chair 2 x 10 Lateral step-up and over 8" box x 5 with bilateral UE support Knee extension machine 20# 2 x 10 Knee flexion machine 20# 2 x 10 Neuromuscular re-ed: Tandem stance 3 x 30 sec each   [facility] Adult PT Treatment:                                                DATE: 12/02/21 Therapeutic Exercise: Nustep L5 x 5 minutes  Heel/toe raises  Hip abduction x 15 each Hip ext x 15 each  Blue rocker board A/P weight shifting ,progressed to no UE.  Red band clam 15 x 2  Bridge x 15 STS from elevated chair-2 pillows, cues for weight shift to avoid fall back Neuromuscular re-ed: Tandem stance 45 sec x 2 Airex pad wide with head turns, nods, narrow with head turns and nods  [facility] Adult PT Treatment:                                                DATE: 11/23/21 Therapeutic Exercise: Sit to stand x 10 Standing hip abduction x 15 each Standing hip extension x 15 each Standing heel toe raises x 15 Tandem stance x 30 sec each   PATIENT EDUCATION:  Education details: HEP progression with yellow band Person educated: Patient Education method: Explanation, Demonstration, Tactile cues, Verbal cues, Handouts Education comprehension: verbalized understanding, returned demonstration, verbal cues required, tactile cues required, and needs further education   HOME EXERCISE PROGRAM: Access Code: [email or web address]     ASSESSMENT: CLINICAL IMPRESSION: Patient tolerated therapy well with no adverse effects. Therapy focused primarily on strengthening and balance. Patient would  benefit from continued skilled PT to progress his mobility and strength in  order to reduce pain and maximize functional ability.     OBJECTIVE IMPAIRMENTS Abnormal gait, decreased activity tolerance, decreased balance, decreased strength, postural dysfunction, and pain.    ACTIVITY LIMITATIONS standing, squatting, stairs, and locomotion level   PARTICIPATION LIMITATIONS: cleaning, laundry, shopping, and community activity   PERSONAL FACTORS Age, Fitness, Past/current experiences, Time since onset of injury/illness/exacerbation, and 3+ comorbidities: Lumbar compression fracture sustained 08/24/2021 following fall, lumbar kyphoplasty 09/11/2021, history of stroke and carotid artery dissection in 12/2020  are also affecting patient's functional outcome.      GOALS: Goals reviewed with patient? Yes   SHORT TERM GOALS: Target date: 12/21/2021   Patient will be I with initial HEP in order to progress with therapy. Baseline: HEP provided at evaluation Goal status: INITIAL   2.  PT will review FOTO with patient by 3rd visit in order to understand expected progress and outcome with therapy. Baseline: FOTO assessed at eval Goal status: INITIAL   3.  Patient will demonstrate 5xSTS </= 11 seconds to indicate improved LE strength and reduced fall risk. Baseline: 13 seconds Goal status: INITIAL   LONG TERM GOALS: Target date: 01/18/2022   Patient will be I with final HEP to maintain progress from PT. Baseline:  Goal status: INITIAL   2.  Patient will report >/= 58% status on FOTO to indicate improved functional ability. Baseline: 54% Goal status: INITIAL   3.  Patient will demonstrate improved knee strength = 5/5 MMT and hip strength >/= 4/5 MMT to improve walking stability. Baseline: Knee strength grossly 4+/5 MMT, hip strength grossly 3/5 MMT Goal status: INITIAL   4.  Patient will perform using SPC >/= 473 ft in order to improve community access. Baseline: 350 ft using SPC Goal status: INITIAL   5.  Patient will exhibit TUG in </= 11 seconds in order to  reduce fall risk. Baseline: 16 seconds Goal status: INITIAL     PLAN: PT FREQUENCY: 1-2x/week   PT DURATION: 8 weeks   PLANNED INTERVENTIONS: Therapeutic exercises, Therapeutic activity, Neuromuscular re-education, Balance training, Gait training, Patient/Family education, Joint manipulation, Stair training, Aquatic Therapy, Dry Needling, Manual therapy, and Re-evaluation.   PLAN FOR NEXT SESSION: Review HEP and progress PRN, progress LE strengthening, balance training      Jannette Spanner, PTA 12/16/21 2:16 PM Phone: [phone] Fax: [phone]

## 2021-12-17 ENCOUNTER — Encounter: Payer: Self-pay | Admitting: Internal Medicine

## 2021-12-17 ENCOUNTER — Other Ambulatory Visit: Payer: Self-pay

## 2021-12-17 ENCOUNTER — Ambulatory Visit: Payer: Medicare HMO | Admitting: Internal Medicine

## 2021-12-17 DIAGNOSIS — A439 Nocardiosis, unspecified: Secondary | ICD-10-CM

## 2021-12-17 NOTE — Progress Notes (Signed)
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   Medication monitoring encounter 03/07/2020   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   Bronchiectasis without complication (HCC) 11/22/2019   Hereditary spherocytosis (HCC) 08/29/2019   Seborrheic keratoses 08/29/2019   OSA (obstructive sleep apnea) 03/19/2019   History of TIA (transient ischemic attack) 01/23/2018   Hemoptysis 07/15/2017   Pulmonary infiltrates 07/15/2017   History of tobacco use 06/06/2017   Essential hypertension 09/22/2015   Gastroesophageal reflux disease without esophagitis 06/05/2014   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   ASPIRIN EC 81 MG EC TABLET    Take 1 tablet (81 mg total) by mouth daily. Swallow whole.   ATORVASTATIN (LIPITOR) 40 MG TABLET    Take 1 tablet (40 mg total) by mouth daily.   MOUTHWASH COMPOUNDING BASE (MOUTH WASH-GP PO)    Take by mouth.   NAPHAZOLINE HCL (CLEAR EYES OP)    Place 1 drop into both eyes at bedtime.   SODIUM CHLORIDE (OCEAN) 0.65 % NASAL SPRAY    Place 1 spray into the nose daily as needed for congestion.   Modified Medications   No medications on file  Discontinued Medications   No medications on file    Subjective: Samuel Mata is in for a work in visit.  He was seen by my partner, Dr. 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  She treated him with trimethoprim/sulfamethoxazole.  He had a good response to treatment and completed 6 months of therapy in April 2022.  He recalls  being told by her that the nocardia could spread to his brain.  He had a brain MRI in October 2021 that did not show any evidence of brain infection.  He suffered an acute left frontoparietal CVA in August 2022.  Again, there was no evidence of brain infection at the time of repeat MRI.  He has been undergoing physical therapy for the past month and is feeling stronger and steadier on his feet.  However, he recently had a brief period of brain "fog" and became worried about the possibility that the infection had come back and spread to his brain.  He has occasional mild headaches.  Overall he and his wife state that he is doing better since his stroke last summer.  Review of Systems: Review of Systems  Constitutional:  Negative for chills, diaphoresis, fever and weight loss.  HENT:  Positive for hearing loss.   Respiratory:  Positive for cough and sputum production. Negative for hemoptysis, shortness of breath and wheezing.        He has a mild stable a.m. cough.  Cardiovascular:  Negative for chest pain.  Neurological:  Positive for focal weakness and headaches. Negative for dizziness.    Past Medical History:  Diagnosis Date   Enlarged prostate    Hypertension    Renal stones    Stroke [facility])     Social History   Tobacco Use   Smoking status: Former  Packs/day: 1.00    Years: 30.00    Total pack years: 30.00    Types: Cigarettes    Quit date: 10/07/1984    Years since quitting: 37.2   Smokeless tobacco: Never  Substance Use Topics   Alcohol use: Never   Drug use: Never    Family History  Problem Relation Age of Onset   Lung disease Neg Hx     Allergies  Allergen Reactions   Codeine     unknown    Objective: Vitals:   12/17/21 1038  BP: (!) 144/76  Pulse: 65  Temp: 98.7 F (37.1 C)  TempSrc: Oral  SpO2: 95%  Weight: 127 lb (57.6 kg)   Body mass index is 19.89 kg/m.  Physical Exam Constitutional:      Comments: He is very pleasant and in no distress.  He  is accompanied by his wife.  Cardiovascular:     Rate and Rhythm: Normal rate and regular rhythm.     Heart sounds: No murmur heard. Pulmonary:     Effort: Pulmonary effort is normal.     Breath sounds: Normal breath sounds. No wheezing, rhonchi or rales.  Neurological:     Comments: He walks with the aid of a cane but has a steady gait.  Psychiatric:        Mood and Affect: Mood normal.     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  I find no evidence that he has any CNS infection or other complications related to nocardia.  His wife says that he had "latched onto" the comment by my partner many years ago.  I told him that he did not need to worry about this anymore.  He seems reassured.        Cliffton Asters, MD [facility] for Infectious Disease [facility] [phone] pager   [phone] cell 12/17/2021, 12:47 PM

## 2021-12-17 NOTE — Assessment & Plan Note (Signed)
His nocardia infection has been cured.  I find no evidence that he has any CNS infection or other complications related to nocardia.  His wife says that he had "latched onto" the comment by my partner many years ago.  I told him that he did not need to worry about this anymore.  He seems reassured.

## 2021-12-21 NOTE — Therapy (Signed)
OUTPATIENT PHYSICAL THERAPY TREATMENT NOTE   Patient Name: Samuel Mata MRN: [id number] DOB:03/05/1931, 88 y.o., male Today's Date: 12/23/2021  PCP: [facility]   REFERRING PROVIDER: Chesley Noon, MD   END OF SESSION:   PT End of Session - 12/23/21 1409     Visit Number 5    Number of Visits 8    Date for PT Re-Evaluation 01/18/22    Authorization Type [employer] MCR    Progress Note Due on Visit 10    PT Start Time 1401    PT Stop Time 1442    PT Time Calculation (min) 41 min    Activity Tolerance Patient tolerated treatment well    Behavior During Therapy WFL for tasks assessed/performed               Past Medical History:  Diagnosis Date   Enlarged prostate    Hypertension    Renal stones    Stroke [facility])    Past Surgical History:  Procedure Laterality Date   APPENDECTOMY     CHOLECYSTECTOMY     LOOP RECORDER INSERTION N/A 01/15/2021   Procedure: LOOP RECORDER INSERTION;  Surgeon: Thompson Grayer, MD;  Location: [place] [facility];  Service: Cardiovascular;  Laterality: N/A;   NOSE SURGERY     SPINE SURGERY     compressoin fx T12, kyphoplasty   SPLENECTOMY, TOTAL     Patient Active Problem List   Diagnosis Date Noted   Acute ischemic stroke [facility])    Carotid dissection, bilateral ([place])    Aphasia    Immunization due 05/20/2020   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   Medication monitoring encounter 03/07/2020   Nocardial pneumonia ([place]) 02/20/2020   Bronchiectasis without complication ([place]) 97/41/6384   Hereditary spherocytosis ([place]) 08/29/2019   Seborrheic keratoses 08/29/2019   OSA (obstructive sleep apnea) 03/19/2019   History of TIA (transient ischemic attack) 01/23/2018   Hemoptysis 07/15/2017   Pulmonary infiltrates 07/15/2017   History of tobacco use 06/06/2017   Essential hypertension 09/22/2015   Gastroesophageal reflux disease without esophagitis 06/05/2014   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 The heel toe raise exercises may have sprained his right ankle.  PAIN:  Are you having pain? No   OBJECTIVE: (objective measures completed at initial evaluation unless otherwise dated) PATIENT SURVEYS:  FOTO 54% functional status   POSTURE:            Rounded shoulder and forward head posture   LOWER EXTREMITY MMT:     MMT Right eval Left eval Rt / Lt 12/09/21  Hip flexion 4- 4-   Hip extension 3 3   Hip abduction 3 3 3+ / 3+  Knee flexion 4+ 4+   Knee extension 4+ 4+     FUNCTIONAL TESTS:  5 times sit to stand: 13 seconds  12/23/2021: 12 seconds Timed up and go (TUG): 16 seconds 2 minute walk test: 350 ft Berg Balance Scale: 39/56    GAIT: Distance walked: 350 ft Assistive device utilized: Single point cane Level of  assistance: Modified independence Comments: wide BOS with bilateral toe out, decreased trunk rotation and arm swing, occasionally unsteady gait     TODAY'S TREATMENT  [facility] Adult PT Treatment:                                                DATE: 12/23/2021 Therapeutic Exercise: Nustep L6 x 5 minutes with UE/LE while taking subjective Sit to stand 2 x 10 SLR 2 x 10 each Bridge 2 x 10 Hooklying clamshell with blue 2 x 20 Sidelying hip abduction 2 x 10 each LAQ with 6# 2 x 15 each Seated hamstring curl with blue 2 x 10 each Forward step-up 8" 2 x 10 each with single UE support   [facility] Adult PT Treatment:                                                 DATE: 12/16/21 Therapeutic Exercise: Nustep L5 x 5 minutes with UE/LE while taking subjective Heel/toe raises 2 x 20 Hip abduction with yellow at ankles 2 x 10 each Hip extension with yellow at ankles 2 x 10 each  Sit to stand with Airex in chair 2 x 10 Bridge 10 x 2  Hooklying clam Green x 20 SLR 10 x 2  Hip abduction 10 x 2  Neuromuscular re-ed: Tandem stance 3 x 30 sec each- intermittent UE  Airex- narrow stance with head turns, narrow stance with EC  [facility] Adult PT Treatment:                                                DATE: 12/09/21 Therapeutic Exercise: Nustep L5 x 5 minutes with UE/LE while taking subjective Heel/toe raises 2 x 20 Hip abduction with yellow at knees 2 x 15 each Hip extension with yellow at knees 2 x 15 each  Forward step-up 8" box x 10 each with single UE support Sit to stand with Airex in chair 2 x 10 Lateral step-up and over 8" box x 5 with bilateral UE support Knee extension machine 20# 2 x 10 Knee flexion machine 20# 2 x 10 Neuromuscular re-ed: Tandem stance 3 x 30 sec each   PATIENT EDUCATION:  Education details: HEP Person educated: Patient Education method: Consulting civil engineer, Demonstration, Corporate treasurer cues, Verbal cues Education comprehension: verbalized understanding, returned demonstration, verbal cues required, tactile cues required, and needs further education   HOME EXERCISE PROGRAM: Access Code: [email or web address]     ASSESSMENT: CLINICAL IMPRESSION: Patient tolerated therapy well with no adverse effects. Therapy focused on progress LE strength with good tolerance. Patient seems to be progressing well with strengthening exercises and did demonstrate improved 5xSTS, but still with overall limitation. He does require consistent cueing for proper technique with exercises and he did not report any increased pain with exercises this visit. No changes to HEP. Patient would benefit from continued skilled PT to progress his mobility and strength in order to reduce  pain and maximize functional ability.     OBJECTIVE IMPAIRMENTS Abnormal gait, decreased activity tolerance, decreased balance, decreased strength, postural dysfunction, and pain.    ACTIVITY  LIMITATIONS standing, squatting, stairs, and locomotion level   PARTICIPATION LIMITATIONS: cleaning, laundry, shopping, and community activity   PERSONAL FACTORS Age, Fitness, Past/current experiences, Time since onset of injury/illness/exacerbation, and 3+ comorbidities: Lumbar compression fracture sustained 08/24/2021 following fall, lumbar kyphoplasty 09/11/2021, history of stroke and carotid artery dissection in 12/2020  are also affecting patient's functional outcome.      GOALS: Goals reviewed with patient? Yes   SHORT TERM GOALS: Target date: 12/21/2021   Patient will be I with initial HEP in order to progress with therapy. Baseline: HEP provided at evaluation 12/23/2021: progressing HEP Goal status: ONGOING   2.  PT will review FOTO with patient by 3rd visit in order to understand expected progress and outcome with therapy. Baseline: FOTO assessed at eval 12/23/2021: reviewed FOTO Goal status: MET   3.  Patient will demonstrate 5xSTS </= 11 seconds to indicate improved LE strength and reduced fall risk. Baseline: 13 seconds 12/23/2021: 12 seconds Goal status: PARTIALLY MET   LONG TERM GOALS: Target date: 01/18/2022   Patient will be I with final HEP to maintain progress from PT. Baseline:  Goal status: INITIAL   2.  Patient will report >/= 58% status on FOTO to indicate improved functional ability. Baseline: 54% Goal status: INITIAL   3.  Patient will demonstrate improved knee strength = 5/5 MMT and hip strength >/= 4/5 MMT to improve walking stability. Baseline: Knee strength grossly 4+/5 MMT, hip strength grossly 3/5 MMT Goal status: INITIAL   4.  Patient will perform 2MWT using SPC >/= 473 ft in order to improve community access. Baseline: 350 ft using SPC Goal status: INITIAL    5.  Patient will exhibit TUG in </= 11 seconds in order to reduce fall risk. Baseline: 16 seconds Goal status: INITIAL     PLAN: PT FREQUENCY: 1-2x/week   PT DURATION: 8 weeks   PLANNED INTERVENTIONS: Therapeutic exercises, Therapeutic activity, Neuromuscular re-education, Balance training, Gait training, Patient/Family education, Joint manipulation, Stair training, Aquatic Therapy, Dry Needling, Manual therapy, and Re-evaluation.   PLAN FOR NEXT SESSION: Review HEP and progress PRN, progress LE strengthening, balance training     Hilda Blades, PT, DPT, LAT, ATC 12/23/21  2:54 PM Phone: [phone] Fax: [phone]

## 2021-12-23 ENCOUNTER — Ambulatory Visit: Payer: Medicare HMO | Admitting: Physical Therapy

## 2021-12-23 ENCOUNTER — Encounter: Payer: Self-pay | Admitting: Physical Therapy

## 2021-12-23 ENCOUNTER — Other Ambulatory Visit: Payer: Self-pay

## 2021-12-23 DIAGNOSIS — M6281 Muscle weakness (generalized): Secondary | ICD-10-CM | POA: Diagnosis not present

## 2021-12-28 NOTE — Progress Notes (Signed)
Carelink Summary Report / Loop Recorder 

## 2021-12-30 ENCOUNTER — Encounter: Payer: Self-pay | Admitting: Physical Therapy

## 2021-12-30 ENCOUNTER — Ambulatory Visit: Payer: Medicare HMO | Admitting: Physical Therapy

## 2021-12-30 DIAGNOSIS — M6281 Muscle weakness (generalized): Secondary | ICD-10-CM

## 2021-12-30 NOTE — Therapy (Signed)
OUTPATIENT PHYSICAL THERAPY TREATMENT NOTE   Patient Name: Samuel Mata MRN: [id number] DOB:May 03, 1931, 88 y.o., male Today's Date: 12/30/2021  PCP: [facility]   REFERRING PROVIDER: Chesley Noon, MD   END OF SESSION:   PT End of Session - 12/30/21 1359     Visit Number 6    Number of Visits 8    Date for PT Re-Evaluation 01/18/22    Authorization Type [employer] MCR    Progress Note Due on Visit 10    PT Start Time 1400    PT Stop Time 1441    PT Time Calculation (min) 41 min    Activity Tolerance Patient tolerated treatment well    Behavior During Therapy WFL for tasks assessed/performed               Past Medical History:  Diagnosis Date   Enlarged prostate    Hypertension    Renal stones    Stroke [facility])    Past Surgical History:  Procedure Laterality Date   APPENDECTOMY     CHOLECYSTECTOMY     LOOP RECORDER INSERTION N/A 01/15/2021   Procedure: LOOP RECORDER INSERTION;  Surgeon: Thompson Grayer, MD;  Location: [place] [facility];  Service: Cardiovascular;  Laterality: N/A;   NOSE SURGERY     SPINE SURGERY     compressoin fx T12, kyphoplasty   SPLENECTOMY, TOTAL     Patient Active Problem List   Diagnosis Date Noted   Acute ischemic stroke [facility])    Carotid dissection, bilateral ([place])    Aphasia    Immunization due 05/20/2020   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   Medication monitoring encounter 03/07/2020   Nocardial pneumonia ([place]) 02/20/2020   Bronchiectasis without complication ([place]) 40/98/1191   Hereditary spherocytosis ([place]) 08/29/2019   Seborrheic keratoses 08/29/2019   OSA (obstructive sleep apnea) 03/19/2019   History of TIA (transient ischemic attack) 01/23/2018   Hemoptysis 07/15/2017   Pulmonary infiltrates 07/15/2017   History of tobacco use 06/06/2017   Essential hypertension 09/22/2015   Gastroesophageal reflux disease without esophagitis 06/05/2014   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   Chronic obstructive  pulmonary disease ([place]) 08/14/2013   Chronic rhinitis 08/14/2013   Hearing loss of both ears 08/14/2013    REFERRING DIAG: Weakness, Lumbar back pain   THERAPY DIAG: Muscle weakness (generalized)  Rationale for Evaluation and Treatment Rehabilitation  PERTINENT HISTORY: Fall   PRECAUTIONS: Lumbar compression fracture sustained 08/24/2021 following fall, lumbar kyphoplasty 09/11/2021, history of stroke and carotid artery dissection in 12/2020    SUBJECTIVE: Pt reports that he feels he sprain his ankle while doing heel raises several weeks ago, but this is slowly improving.   PAIN:  Are you having pain? No   OBJECTIVE: (objective measures completed at initial evaluation unless otherwise dated) PATIENT SURVEYS:  FOTO 54% functional status   POSTURE:            Rounded shoulder and forward head posture   LOWER EXTREMITY MMT:     MMT Right eval Left eval Rt / Lt 12/09/21  Hip flexion 4- 4-   Hip extension 3 3   Hip abduction 3 3 3+ / 3+  Knee flexion 4+ 4+   Knee extension 4+ 4+     FUNCTIONAL TESTS:  5 times sit to stand: 13 seconds  12/23/2021: 12 seconds Timed up and go (TUG): 16 seconds 2 minute walk test: 350 ft Berg Balance Scale: 39/56    GAIT: Distance walked: 350 ft Assistive device utilized: Single  point cane Level of assistance: Modified independence Comments: wide BOS with bilateral toe out, decreased trunk rotation and arm swing, occasionally unsteady gait     TODAY'S TREATMENT   [facility] Adult PT Treatment:                                                DATE: 12/30/2021 Therapeutic Exercise: Nustep L7 x 5 minutes with UE/LE while taking subjective Sit to stand x 10 SLR 2 x 10 each Bridge 2 x 10 Sidelying hip abduction 2 x 10 each (NT) Knee ext machine 2x10 5# Seated hamstring curl 20# 2x10 Forward step-up 4" 2 x 10 each with no UE support CGA Hip adduction ball squeeze - 3'' - 2x10   Neuromuscular re-ed: Semi tandem (50%) Blue rocker board  DF/PF  [facility] Adult PT Treatment:                                                DATE: 12/23/2021 Therapeutic Exercise: Nustep L6 x 5 minutes with UE/LE while taking subjective Sit to stand 2 x 10 SLR 2 x 10 each Bridge 2 x 10 Hooklying clamshell with blue 2 x 20 Sidelying hip abduction 2 x 10 each LAQ with 6# 2 x 15 each Seated hamstring curl with blue 2 x 10 each Forward step-up 8" 2 x 10 each with single UE support   [facility] Adult PT Treatment:                                                DATE: 12/16/21 Therapeutic Exercise: Nustep L5 x 5 minutes with UE/LE while taking subjective Heel/toe raises 2 x 20 Hip abduction with yellow at ankles 2 x 10 each Hip extension with yellow at ankles 2 x 10 each  Sit to stand with Airex in chair 2 x 10 Bridge 10 x 2  Hooklying clam Green x 20 SLR 10 x 2  Hip abduction 10 x 2  Neuromuscular re-ed: Tandem stance 3 x 30 sec each- intermittent UE  Airex- narrow stance with head turns, narrow stance with EC  [facility] Adult PT Treatment:                                                DATE: 12/09/21 Therapeutic Exercise: Nustep L5 x 5 minutes with UE/LE while taking subjective Heel/toe raises 2 x 20 Hip abduction with yellow at knees 2 x 15 each Hip extension with yellow at knees 2 x 15 each  Forward step-up 8" box x 10 each with single UE support Sit to stand with Airex in chair 2 x 10 Lateral step-up and over 8" box x 5 with bilateral UE support Knee extension machine 20# 2 x 10 Knee flexion machine 20# 2 x 10 Neuromuscular re-ed: Tandem stance 3 x 30 sec each   PATIENT EDUCATION:  Education details: HEP Person educated: Patient Education method: Explanation, Demonstration, Corporate treasurer cues, Verbal cues Education comprehension: verbalized understanding, returned  demonstration, verbal cues required, tactile cues required, and needs further education   HOME EXERCISE PROGRAM: Access Code: [email or web address]     ASSESSMENT: CLINICAL IMPRESSION: Samuel Mata  tolerated session well with no adverse reaction.  We added in a bit of balance today to good effect.  He does require cuing throughout for form and pacing.  He requires CGA for step up with no UE support.  He was unsteady with step up to begin, but improved with repetition.      OBJECTIVE IMPAIRMENTS Abnormal gait, decreased activity tolerance, decreased balance, decreased strength, postural dysfunction, and pain.    ACTIVITY LIMITATIONS standing, squatting, stairs, and locomotion level   PARTICIPATION LIMITATIONS: cleaning, laundry, shopping, and community activity   PERSONAL FACTORS Age, Fitness, Past/current experiences, Time since onset of injury/illness/exacerbation, and 3+ comorbidities: Lumbar compression fracture sustained 08/24/2021 following fall, lumbar kyphoplasty 09/11/2021, history of stroke and carotid artery dissection in 12/2020  are also affecting patient's functional outcome.      GOALS: Goals reviewed with patient? Yes   SHORT TERM GOALS: Target date: 12/21/2021   Patient will be I with initial HEP in order to progress with therapy. Baseline: HEP provided at evaluation 12/23/2021: progressing HEP Goal status: ONGOING   2.  PT will review FOTO with patient by 3rd visit in order to understand expected progress and outcome with therapy. Baseline: FOTO assessed at eval 12/23/2021: reviewed FOTO Goal status: MET   3.  Patient will demonstrate 5xSTS </= 11 seconds to indicate improved LE strength and reduced fall risk. Baseline: 13 seconds 12/23/2021: 12 seconds Goal status: PARTIALLY MET   LONG TERM GOALS: Target date: 01/18/2022   Patient will be I with final HEP to maintain progress from PT. Baseline:  Goal status: INITIAL   2.  Patient will report >/= 58% status on FOTO to indicate improved functional ability. Baseline: 54% Goal status: INITIAL   3.  Patient will demonstrate improved knee strength = 5/5 MMT and hip strength >/= 4/5 MMT to improve walking  stability. Baseline: Knee strength grossly 4+/5 MMT, hip strength grossly 3/5 MMT Goal status: INITIAL   4.  Patient will perform 2MWT using SPC >/= 473 ft in order to improve community access. Baseline: 350 ft using SPC Goal status: INITIAL   5.  Patient will exhibit TUG in </= 11 seconds in order to reduce fall risk. Baseline: 16 seconds Goal status: INITIAL     PLAN: PT FREQUENCY: 1-2x/week   PT DURATION: 8 weeks   PLANNED INTERVENTIONS: Therapeutic exercises, Therapeutic activity, Neuromuscular re-education, Balance training, Gait training, Patient/Family education, Joint manipulation, Stair training, Aquatic Therapy, Dry Needling, Manual therapy, and Re-evaluation.   PLAN FOR NEXT SESSION: Review HEP and progress PRN, progress LE strengthening, balance training     Mathis Dad PT 12/30/21  2:41 PM Phone: [phone] Fax: [phone]

## 2022-01-04 ENCOUNTER — Ambulatory Visit (INDEPENDENT_AMBULATORY_CARE_PROVIDER_SITE_OTHER): Payer: Medicare HMO

## 2022-01-04 DIAGNOSIS — I639 Cerebral infarction, unspecified: Secondary | ICD-10-CM

## 2022-01-06 ENCOUNTER — Encounter: Payer: Self-pay | Admitting: Physical Therapy

## 2022-01-06 ENCOUNTER — Ambulatory Visit: Payer: Medicare HMO | Admitting: Physical Therapy

## 2022-01-06 DIAGNOSIS — M6281 Muscle weakness (generalized): Secondary | ICD-10-CM

## 2022-01-06 LAB — CUP PACEART REMOTE DEVICE CHECK
Date Time Interrogation Session: 20230820230410
Implantable Pulse Generator Implant Date: 20220901

## 2022-01-06 NOTE — Therapy (Addendum)
OUTPATIENT PHYSICAL THERAPY TREATMENT NOTE  DISCHARGE   Patient Name: Samuel Mata MRN: [id number] DOB:[birth year], 88 y.o., male Today's Date: 01/06/2022  PCP: [facility]   REFERRING PROVIDER: Chesley Noon, MD   END OF SESSION:   PT End of Session - 01/06/22 1356     Visit Number 7    Number of Visits 8    Date for PT Re-Evaluation 01/18/22    Authorization Type [employer] MCR    Progress Note Due on Visit 10    PT Start Time 4268    PT Stop Time 1435    PT Time Calculation (min) 40 min    Activity Tolerance Patient tolerated treatment well    Behavior During Therapy WFL for tasks assessed/performed               Past Medical History:  Diagnosis Date   Enlarged prostate    Hypertension    Renal stones    Stroke [facility])    Past Surgical History:  Procedure Laterality Date   APPENDECTOMY     CHOLECYSTECTOMY     LOOP RECORDER INSERTION N/A 01/15/2021   Procedure: LOOP RECORDER INSERTION;  Surgeon: Thompson Grayer, MD;  Location: [place] [facility];  Service: Cardiovascular;  Laterality: N/A;   NOSE SURGERY     SPINE SURGERY     compressoin fx T12, kyphoplasty   SPLENECTOMY, TOTAL     Patient Active Problem List   Diagnosis Date Noted   Acute ischemic stroke [facility])    Carotid dissection, bilateral ([place])    Aphasia    Immunization due 05/20/2020   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   Medication monitoring encounter 03/07/2020   Nocardial pneumonia ([place]) 02/20/2020   Bronchiectasis without complication ([place]) 34/19/6222   Hereditary spherocytosis ([place]) 08/29/2019   Seborrheic keratoses 08/29/2019   OSA (obstructive sleep apnea) 03/19/2019   History of TIA (transient ischemic attack) 01/23/2018   Hemoptysis 07/15/2017   Pulmonary infiltrates 07/15/2017   History of tobacco use 06/06/2017   Essential hypertension 09/22/2015   Gastroesophageal reflux disease without esophagitis 06/05/2014   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   Chronic  obstructive pulmonary disease ([place]) 08/14/2013   Chronic rhinitis 08/14/2013   Hearing loss of both ears 08/14/2013    REFERRING DIAG: Weakness, Lumbar back pain   THERAPY DIAG: Muscle weakness (generalized)  Rationale for Evaluation and Treatment Rehabilitation  PERTINENT HISTORY: Fall   PRECAUTIONS: Lumbar compression fracture sustained 08/24/2021 following fall, lumbar kyphoplasty 09/11/2021, history of stroke and carotid artery dissection in 12/2020    SUBJECTIVE: Pt reports that he feels he sprain his ankle while doing heel raises several weeks ago, but this is slowly improving.   PAIN:  Are you having pain? No   OBJECTIVE: (objective measures completed at initial evaluation unless otherwise dated) PATIENT SURVEYS:  FOTO 54% functional status   POSTURE:            Rounded shoulder and forward head posture   LOWER EXTREMITY MMT:     MMT Right eval Left eval Rt / Lt 12/09/21  Hip flexion 4- 4-   Hip extension 3 3   Hip abduction 3 3 3+ / 3+  Knee flexion 4+ 4+   Knee extension 4+ 4+     FUNCTIONAL TESTS:  5 times sit to stand: 13 seconds  12/23/2021: 12 seconds Timed up and go (TUG): 16 seconds 2 minute walk test: 350 ft Berg Balance Scale: 39/56    GAIT: Distance walked: 350 ft Assistive device  utilized: Single point cane Level of assistance: Modified independence Comments: wide BOS with bilateral toe out, decreased trunk rotation and arm swing, occasionally unsteady gait     TODAY'S TREATMENT   [facility] Adult PT Treatment:                                                DATE: 01/06/2022 Therapeutic Exercise: Nustep L8 x 5 minutes with UE/LE while taking subjective Sit to stand 3x10 Lateral walking with band at knees - YTB (NT) Forward step-up 4" 2 x 10 each with no UE support CGA Lat with UE support - 2x10 - 4'' step  Neuromuscular re-ed: Semi tandem (75%) Blue rocker board DF/PF  [facility] Adult PT Treatment:                                                 DATE: 12/23/2021 Therapeutic Exercise: Nustep L6 x 5 minutes with UE/LE while taking subjective Sit to stand 2 x 10 SLR 2 x 10 each Bridge 2 x 10 Hooklying clamshell with blue 2 x 20 Sidelying hip abduction 2 x 10 each LAQ with 6# 2 x 15 each Seated hamstring curl with blue 2 x 10 each Forward step-up 8" 2 x 10 each with single UE support   [facility] Adult PT Treatment:                                                DATE: 12/16/21 Therapeutic Exercise: Nustep L5 x 5 minutes with UE/LE while taking subjective Heel/toe raises 2 x 20 Hip abduction with yellow at ankles 2 x 10 each Hip extension with yellow at ankles 2 x 10 each  Sit to stand with Airex in chair 2 x 10 Bridge 10 x 2  Hooklying clam Green x 20 SLR 10 x 2  Hip abduction 10 x 2  Neuromuscular re-ed: Tandem stance 3 x 30 sec each- intermittent UE  Airex- narrow stance with head turns, narrow stance with EC  [facility] Adult PT Treatment:                                                DATE: 12/09/21 Therapeutic Exercise: Nustep L5 x 5 minutes with UE/LE while taking subjective Heel/toe raises 2 x 20 Hip abduction with yellow at knees 2 x 15 each Hip extension with yellow at knees 2 x 15 each  Forward step-up 8" box x 10 each with single UE support Sit to stand with Airex in chair 2 x 10 Lateral step-up and over 8" box x 5 with bilateral UE support Knee extension machine 20# 2 x 10 Knee flexion machine 20# 2 x 10 Neuromuscular re-ed: Tandem stance 3 x 30 sec each   PATIENT EDUCATION:  Education details: HEP Person educated: Patient Education method: Consulting civil engineer, Demonstration, Corporate treasurer cues, Verbal cues Education comprehension: verbalized understanding, returned demonstration, verbal cues required, tactile cues required, and needs further education   HOME EXERCISE PROGRAM: Access  Code: [email or web address]     ASSESSMENT: CLINICAL IMPRESSION: Stefan tolerated session well with no adverse reaction.  We continue strength progressing  with more emphasis on standing and functional strengthening today.  He requires CGA throughout standing exercises d/t fall risk.  We will continue to progress as able.     OBJECTIVE IMPAIRMENTS Abnormal gait, decreased activity tolerance, decreased balance, decreased strength, postural dysfunction, and pain.    ACTIVITY LIMITATIONS standing, squatting, stairs, and locomotion level   PARTICIPATION LIMITATIONS: cleaning, laundry, shopping, and community activity   PERSONAL FACTORS Age, Fitness, Past/current experiences, Time since onset of injury/illness/exacerbation, and 3+ comorbidities: Lumbar compression fracture sustained 08/24/2021 following fall, lumbar kyphoplasty 09/11/2021, history of stroke and carotid artery dissection in 12/2020  are also affecting patient's functional outcome.      GOALS: Goals reviewed with patient? Yes   SHORT TERM GOALS: Target date: 12/21/2021   Patient will be I with initial HEP in order to progress with therapy. Baseline: HEP provided at evaluation 12/23/2021: progressing HEP Goal status: ONGOING   2.  PT will review FOTO with patient by 3rd visit in order to understand expected progress and outcome with therapy. Baseline: FOTO assessed at eval 12/23/2021: reviewed FOTO Goal status: MET   3.  Patient will demonstrate 5xSTS </= 11 seconds to indicate improved LE strength and reduced fall risk. Baseline: 13 seconds 12/23/2021: 12 seconds Goal status: PARTIALLY MET   LONG TERM GOALS: Target date: 01/18/2022   Patient will be I with final HEP to maintain progress from PT. Baseline:  Goal status: INITIAL   2.  Patient will report >/= 58% status on FOTO to indicate improved functional ability. Baseline: 54% Goal status: INITIAL   3.  Patient will demonstrate improved knee strength = 5/5 MMT and hip strength >/= 4/5 MMT to improve walking stability. Baseline: Knee strength grossly 4+/5 MMT, hip strength grossly 3/5 MMT Goal status: INITIAL   4.  Patient  will perform 2MWT using SPC >/= 473 ft in order to improve community access. Baseline: 350 ft using SPC Goal status: INITIAL   5.  Patient will exhibit TUG in </= 11 seconds in order to reduce fall risk. Baseline: 16 seconds Goal status: INITIAL     PLAN: PT FREQUENCY: 1-2x/week   PT DURATION: 8 weeks   PLANNED INTERVENTIONS: Therapeutic exercises, Therapeutic activity, Neuromuscular re-education, Balance training, Gait training, Patient/Family education, Joint manipulation, Stair training, Aquatic Therapy, Dry Needling, Manual therapy, and Re-evaluation.   PLAN FOR NEXT SESSION: Review HEP and progress PRN, progress LE strengthening, balance training     Mathis Dad PT 01/06/22  2:34 PM Phone: [phone] Fax: [phone]      PHYSICAL THERAPY DISCHARGE SUMMARY  Visits from Start of Care: 7  Current functional level related to goals / functional outcomes: See above   Remaining deficits: See above   Education / Equipment: HEP   Patient agrees to discharge. Patient goals were partially met. Patient is being discharged due to not returning since the last visit.  Hilda Blades, PT, DPT, LAT, ATC 02/16/22  8:31 AM Phone: [phone] Fax: [phone]

## 2022-01-13 ENCOUNTER — Ambulatory Visit: Payer: Medicare HMO | Admitting: Physical Therapy

## 2022-02-01 NOTE — Progress Notes (Signed)
Carelink Summary Report / Loop Recorder 

## 2022-02-08 ENCOUNTER — Ambulatory Visit: Payer: Medicare HMO

## 2022-03-08 ENCOUNTER — Telehealth: Payer: Self-pay | Admitting: Student

## 2022-03-08 NOTE — Telephone Encounter (Signed)
Pt has Loop 2.  Cannot force send a transmission.  Device is communicating appropriately.    Sent mychart message advising nothing to do.  Next monthly report is scheduled.

## 2022-03-08 NOTE — Telephone Encounter (Signed)
Pt calling because he missed his transmission and he received a letter about it but he was unaware of this. Pt wants to know what to do.

## 2022-03-15 ENCOUNTER — Ambulatory Visit (INDEPENDENT_AMBULATORY_CARE_PROVIDER_SITE_OTHER): Payer: Medicare HMO

## 2022-03-15 DIAGNOSIS — I639 Cerebral infarction, unspecified: Secondary | ICD-10-CM

## 2022-03-15 LAB — CUP PACEART REMOTE DEVICE CHECK
Date Time Interrogation Session: 20231025230810
Implantable Pulse Generator Implant Date: 20220901

## 2022-04-17 NOTE — Progress Notes (Signed)
Carelink Summary Report / Loop Recorder 

## 2022-04-19 ENCOUNTER — Ambulatory Visit (INDEPENDENT_AMBULATORY_CARE_PROVIDER_SITE_OTHER): Payer: Medicare HMO

## 2022-04-19 DIAGNOSIS — I639 Cerebral infarction, unspecified: Secondary | ICD-10-CM

## 2022-04-20 LAB — CUP PACEART REMOTE DEVICE CHECK
Date Time Interrogation Session: 20231203230754
Implantable Pulse Generator Implant Date: 20220901

## 2022-05-24 ENCOUNTER — Ambulatory Visit (INDEPENDENT_AMBULATORY_CARE_PROVIDER_SITE_OTHER): Payer: Medicare HMO

## 2022-05-24 DIAGNOSIS — I639 Cerebral infarction, unspecified: Secondary | ICD-10-CM

## 2022-05-25 LAB — CUP PACEART REMOTE DEVICE CHECK
Date Time Interrogation Session: 20240107231709
Implantable Pulse Generator Implant Date: 20220901

## 2022-05-28 NOTE — Progress Notes (Signed)
[facility] Summary Report / Loop Recorder

## 2022-06-28 ENCOUNTER — Ambulatory Visit (INDEPENDENT_AMBULATORY_CARE_PROVIDER_SITE_OTHER): Payer: Medicare HMO

## 2022-06-28 DIAGNOSIS — I639 Cerebral infarction, unspecified: Secondary | ICD-10-CM | POA: Diagnosis not present

## 2022-06-29 LAB — CUP PACEART REMOTE DEVICE CHECK
Date Time Interrogation Session: 20240209230840
Implantable Pulse Generator Implant Date: 20220901

## 2022-07-06 NOTE — Progress Notes (Signed)
Carelink Summary Report / Loop Recorder 

## 2022-08-02 ENCOUNTER — Ambulatory Visit: Payer: Medicare HMO | Attending: Cardiovascular Disease

## 2022-08-02 DIAGNOSIS — I639 Cerebral infarction, unspecified: Secondary | ICD-10-CM

## 2022-08-04 LAB — CUP PACEART REMOTE DEVICE CHECK
Date Time Interrogation Session: 20240317231247
Implantable Pulse Generator Implant Date: 20220901

## 2022-08-12 NOTE — Progress Notes (Signed)
Carelink Summary Report / Loop Recorder 

## 2022-09-06 ENCOUNTER — Ambulatory Visit (INDEPENDENT_AMBULATORY_CARE_PROVIDER_SITE_OTHER): Payer: Medicare HMO

## 2022-09-06 DIAGNOSIS — I639 Cerebral infarction, unspecified: Secondary | ICD-10-CM

## 2022-09-07 LAB — CUP PACEART REMOTE DEVICE CHECK
Date Time Interrogation Session: 20240421230640
Implantable Pulse Generator Implant Date: 20220901

## 2022-09-16 NOTE — Progress Notes (Signed)
Carelink Summary Report / Loop Recorder 

## 2022-10-05 ENCOUNTER — Ambulatory Visit (INDEPENDENT_AMBULATORY_CARE_PROVIDER_SITE_OTHER): Payer: Medicare HMO

## 2022-10-05 ENCOUNTER — Ambulatory Visit (HOSPITAL_COMMUNITY)
Admission: EM | Admit: 2022-10-05 | Discharge: 2022-10-05 | Disposition: A | Discharge status: Home / Self Care | Payer: Medicare HMO | Attending: Family Medicine | Admitting: Family Medicine

## 2022-10-05 ENCOUNTER — Encounter (HOSPITAL_COMMUNITY): Payer: Self-pay

## 2022-10-05 DIAGNOSIS — M25512 Pain in left shoulder: Secondary | ICD-10-CM

## 2022-10-05 NOTE — Discharge Instructions (Signed)
There are no broken bones or new x-ray according to the radiology reading.  There is some arthritis.  Continue with Tylenol--2 tablets every 4-6 hours as needed for pain.  You can also apply diclofenac gel over-the-counter to the area, that can help pain and inflammation.  Please follow-up with your primary care about this issue.

## 2022-10-05 NOTE — ED Triage Notes (Signed)
Patient fell and injured the left shoulder 3-4 days ago. Having reduced range of motion and pain that is getting worse. No history of issues with the shoulder.

## 2022-10-05 NOTE — ED Provider Notes (Signed)
[facility]    CSN: [id number] Arrival date & time: 10/05/22  1458      History   Chief Complaint Chief Complaint  Patient presents with   Fall   Shoulder Injury    HPI Samuel Mata is a 89 y.o. male.    Fall  Shoulder Injury   Here for left shoulder pain.  About 4 days ago he was weeding on a fairly steep grassy bank, when he slipped and fell onto his left shoulder.  He has been taking Tylenol which does relieve the pain very well, though he has had to take it every 4 hours for effect No fever or chills.  No paresthesias in the hand or arm.  The pain does radiate into his upper part of his upper arm.    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   Medication monitoring encounter 03/07/2020   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   Bronchiectasis without complication ([facility]) 11/22/2019   Hereditary spherocytosis ([facility]) 08/29/2019   Seborrheic keratoses 08/29/2019   OSA (obstructive sleep apnea) 03/19/2019   History of TIA (transient ischemic attack) 01/23/2018   Hemoptysis 07/15/2017   Pulmonary infiltrates 07/15/2017   History of tobacco use 06/06/2017   Essential hypertension 09/22/2015   Gastroesophageal reflux disease without esophagitis 06/05/2014   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 Authorizing Provider  acetaminophen (TYLENOL) 325 MG tablet Take 650 mg by mouth as needed.   Yes [provider]  aspirin EC 81 MG EC tablet Take 1 tablet (81 mg total) by mouth daily. Swallow whole. 01/15/21  Yes Suzan Garibaldi, PA-C  atorvastatin (LIPITOR) 40 MG tablet Take 1 tablet (40 mg total) by mouth daily. 09/17/21  Yes McCue, Shanda Bumps, NP  Naphazoline HCl (CLEAR EYES OP) Place 1 drop into both eyes at bedtime.   Yes [provider]  sodium chloride (OCEAN) 0.65 % nasal spray Place 1 spray into the nose daily as needed for congestion.    Yes [provider]  tamsulosin (FLOMAX) 0.4 MG CAPS capsule Take 0.4 mg by mouth daily. 07/13/22  Yes [provider]    Family History Family History  Problem Relation Age of Onset   Lung disease Neg Hx     Social History Social History   Tobacco Use   Smoking status: Former    Packs/day: 1.00    Years: 30.00  Additional pack years: 0.00    Total pack years: 30.00    Types: Cigarettes    Quit date: 10/07/1984    Years since quitting: 38.0   Smokeless tobacco: Never  Substance Use Topics   Alcohol use: Never   Drug use: Never     Allergies   Codeine   Review of Systems Review of Systems   Physical Exam Triage Vital Signs ED Triage Vitals  Enc Vitals Group     BP 10/05/22 1525 (!) 164/65     Pulse Rate 10/05/22 1525 70     Resp 10/05/22 1525 18     Temp 10/05/22 1525 98.3 F (36.8 C)     Temp Source 10/05/22 1525 Oral     SpO2 10/05/22 1525 93 %     Weight --      Height --      Head Circumference --      Peak Flow --      Pain Score 10/05/22 1521 7     Pain Loc --      Pain Edu? --      Excl. in GC? --     No data found.  Updated Vital Signs BP (!) 164/65 (BP Location: Left Arm)   Pulse 70   Temp 98.3 F (36.8 C) (Oral)   Resp 18   SpO2 93%   Visual Acuity Right Eye Distance:   Left Eye Distance:   Bilateral Distance:    Right Eye Near:   Left Eye Near:    Bilateral Near:     Physical Exam Vitals reviewed.  Constitutional:      General: He is not in acute distress.    Appearance: He is not ill-appearing, toxic-appearing or diaphoretic.  Cardiovascular:     Rate and Rhythm: Normal rate and regular rhythm.  Pulmonary:     Effort: Pulmonary effort is normal.     Breath sounds: Normal breath sounds.  Musculoskeletal:     Comments: There is some mild tenderness at the shoulder joint, but there is no tenderness of his upper humerus.  Pulse is normal in the left wrist.  No edema of the wrist or arm.    Skin:    Coloration: Skin is not pale.  Neurological:     Mental Status: He is alert and oriented to person, place, and time.  Psychiatric:        Behavior: Behavior normal.      UC Treatments / Results  Labs (all labs ordered are listed, but only abnormal results are displayed) Labs Reviewed - No data to display  EKG   Radiology DG Shoulder Left  Result Date: 10/05/2022 CLINICAL DATA:  Larey Seat on left shoulder 4 days ago. Pain and decreased range of motion. EXAM: LEFT SHOULDER - 2+ VIEW COMPARISON:  None available FINDINGS: Mild-to-moderate inferior glenoid degenerative osteophytosis. Mild acromioclavicular joint space narrowing with mild-to-moderate peripheral degenerative osteophytosis. No acute fracture is seen. No dislocation. Mild-to-moderate atherosclerotic calcifications within the aortic arch. 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 Electronically Signed   By: Neita Garnet M.D.   On: 10/05/2022 16:36    Procedures Procedures (including critical care time)  Medications Ordered in UC Medications - No data to  display  Initial Impression / Assessment and Plan / UC Course  I have reviewed the triage vital signs and the nursing notes.  Pertinent labs & imaging results that were available during my care of the patient were reviewed by me and considered in my medical  decision making (see chart for details).        There are no fractures, but there is some arthritis in the shoulder joint.   Final Clinical Impressions(s) / UC Diagnoses   Final diagnoses:  Acute pain of left shoulder     Discharge Instructions      There are no broken bones or new x-ray according to the radiology reading.  There is some arthritis.  Continue with Tylenol--2 tablets every 4-6 hours as needed for pain.  You can also apply diclofenac gel over-the-counter to the area, that can help pain and inflammation.  Please follow-up with your primary care about this issue.     ED Prescriptions   None    PDMP not reviewed this encounter.   Zenia Resides, MD 10/05/22 [phone]

## 2022-10-12 ENCOUNTER — Ambulatory Visit (INDEPENDENT_AMBULATORY_CARE_PROVIDER_SITE_OTHER): Payer: Medicare HMO

## 2022-10-12 DIAGNOSIS — I639 Cerebral infarction, unspecified: Secondary | ICD-10-CM

## 2022-10-12 LAB — CUP PACEART REMOTE DEVICE CHECK
Date Time Interrogation Session: 20240524230409
Implantable Pulse Generator Implant Date: 20220901

## 2022-10-13 NOTE — Progress Notes (Signed)
Carelink Summary Report / Loop Recorder 

## 2022-11-05 NOTE — Progress Notes (Signed)
Carelink Summary Report / Loop Recorder 

## 2022-11-11 LAB — CUP PACEART REMOTE DEVICE CHECK
Date Time Interrogation Session: 20240626230346
Implantable Pulse Generator Implant Date: 20220901

## 2022-11-15 ENCOUNTER — Ambulatory Visit (INDEPENDENT_AMBULATORY_CARE_PROVIDER_SITE_OTHER): Payer: Medicare HMO

## 2022-11-15 DIAGNOSIS — I639 Cerebral infarction, unspecified: Secondary | ICD-10-CM

## 2022-12-07 NOTE — Progress Notes (Signed)
Carelink Summary Report / Loop Recorder 

## 2022-12-11 ENCOUNTER — Other Ambulatory Visit: Payer: Self-pay

## 2022-12-11 ENCOUNTER — Ambulatory Visit (HOSPITAL_COMMUNITY)
Admission: EM | Admit: 2022-12-11 | Discharge: 2022-12-11 | Disposition: A | Discharge status: Home / Self Care | Payer: Medicare HMO | Attending: Emergency Medicine | Admitting: Emergency Medicine

## 2022-12-11 ENCOUNTER — Encounter (HOSPITAL_COMMUNITY): Payer: Self-pay | Admitting: *Deleted

## 2022-12-11 DIAGNOSIS — R39198 Other difficulties with micturition: Secondary | ICD-10-CM | POA: Diagnosis not present

## 2022-12-11 DIAGNOSIS — K5900 Constipation, unspecified: Secondary | ICD-10-CM | POA: Diagnosis not present

## 2022-12-11 LAB — POCT URINALYSIS DIP (MANUAL ENTRY)
Bilirubin, UA: NEGATIVE
Blood, UA: NEGATIVE
Glucose, UA: NEGATIVE mg/dL
Ketones, POC UA: NEGATIVE mg/dL
Leukocytes, UA: NEGATIVE
Nitrite, UA: NEGATIVE
Protein Ur, POC: NEGATIVE mg/dL
Spec Grav, UA: 1.015 (ref 1.010–1.025)
Urobilinogen, UA: 0.2 E.U./dL
pH, UA: 7.5 (ref 5.0–8.0)

## 2022-12-11 NOTE — ED Triage Notes (Signed)
Pt reports he normally gets up 2-3 times to void during the night . Pt reports he has not voided since approx. 8PM on Friday. Pt also reports he is constipated. Last BM 3 days. Pt took a laxative this morning with no results.

## 2022-12-11 NOTE — Discharge Instructions (Addendum)
Your urine sample was good and does not show any infection. I am glad you were able to urinate! Keep drinking lots of water and fluids throughout the day to keep your urinary tract moving.  Please take your Flomax (tamsulosin) once every single day. This will help to promote urination  Since you took the osmotic laxative around 8:30 this morning, you may not have a bowel movement until this afternoon. It can take 6-10 hours for the laxative to work.  If you do not have a bowel movement before bedtime tonight, you can try another dose of the laxative (powder dissolved in one cup of water). That will make you have a bowel movement tomorrow morning.  If you develop any pain or discomfort in the abdomen or bladder, please return for evaluation.

## 2022-12-11 NOTE — ED Provider Notes (Signed)
[facility]    CSN: [id number] Arrival date & time: 12/11/22  1037      History   Chief Complaint Chief Complaint  Patient presents with   Urinary Retention   Constipation    HPI Samuel Mata is a 89 y.o. male.  Here with concerns of urinary retention He has not voided in the last 16 hours. Usually gets up to urinate 2-3 times overnight, but not last night. Had 1 cup of tea this morning, no other fluids today. He has no abdominal pain.  History of enlarged prostate. Does not take his Flomax because he forgets  PSA 2 weeks ago normal  Also having constipation. Last BM was 3 days ago. 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   Medication monitoring encounter 03/07/2020   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   Bronchiectasis without complication (HCC) 11/22/2019   Hereditary spherocytosis (HCC) 08/29/2019   Seborrheic keratoses 08/29/2019   OSA (obstructive sleep apnea) 03/19/2019   History of TIA (transient ischemic attack) 01/23/2018   Hemoptysis 07/15/2017   Pulmonary infiltrates 07/15/2017   History of tobacco use 06/06/2017   Essential hypertension 09/22/2015   Gastroesophageal reflux disease without esophagitis 06/05/2014   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 Authorizing Provider  aspirin EC 81 MG EC tablet Take 1 tablet (81 mg total) by mouth daily. Swallow whole. 01/15/21  Yes Suzan Garibaldi, PA-C  atorvastatin (LIPITOR) 40 MG tablet Take 1 tablet (40 mg total) by mouth daily. 09/17/21  Yes McCue, Shanda Bumps, NP  acetaminophen (TYLENOL) 325 MG tablet Take 650 mg by mouth as needed.    [provider]  Naphazoline HCl (CLEAR EYES OP) Place 1 drop into both eyes at bedtime.    [provider]  sodium chloride (OCEAN) 0.65 % nasal spray Place 1 spray into the nose daily as needed for congestion.     [provider]  tamsulosin (FLOMAX) 0.4 MG CAPS capsule Take 0.4 mg by mouth daily. 07/13/22   [provider]    Family History Family History  Problem Relation Age of Onset   Lung disease Neg Hx     Social History Social History   Tobacco Use   Smoking status: Former    Current packs/day: 0.00    Average packs/day: 1 pack/day for 30.0 years (30.0 ttl pk-yrs)    Types: Cigarettes    Start date: 10/08/1954  Quit date: 10/07/1984    Years since quitting: 38.2   Smokeless tobacco: Never  Substance Use Topics   Alcohol use: Never   Drug use: Never     Allergies   Codeine   Review of Systems Review of Systems  Gastrointestinal:  Positive for constipation.   As per HPI  Physical Exam Triage Vital Signs ED Triage Vitals  Encounter Vitals Group     BP 12/11/22 1153 (!) 168/91     Systolic BP Percentile --      Diastolic BP Percentile --      Pulse Rate 12/11/22 1153 (!) 58     Resp 12/11/22 1153 18     Temp 12/11/22 1153 97.8 F (36.6 C)     Temp src --      SpO2 12/11/22 1153 93 %     Weight --      Height --      Head Circumference --       Peak Flow --      Pain Score 12/11/22 1149 0     Pain Loc --      Pain Education --      Exclude from Growth Chart --    No data found.  Updated Vital Signs BP (!) 168/98 (BP Location: Left Arm) Comment: by provider Lurena Joiner Sharea Guinther  Pulse 60   Temp 97.8 F (36.6 C)   Resp 18   SpO2 98%    Physical Exam Vitals and nursing note reviewed.  Constitutional:      General: He is not in acute distress.    Appearance: Normal appearance. He is not ill-appearing.  HENT:     Mouth/Throat:     Mouth: Mucous membranes are moist.     Pharynx: Oropharynx is clear.  Eyes:     General: No scleral icterus.    Conjunctiva/sclera: Conjunctivae normal.  Cardiovascular:     Rate and Rhythm: Normal rate and regular rhythm.     Pulses: Normal pulses.     Heart sounds: Normal heart sounds.  Pulmonary:     Effort: Pulmonary effort is normal.     Breath sounds: Normal breath sounds.  Abdominal:     General: There is no distension.     Palpations: Abdomen is soft.     Tenderness: There is no abdominal tenderness. There is no right CVA tenderness, left CVA tenderness or guarding.  Musculoskeletal:     Cervical back: Normal range of motion.  Skin:    General: Skin is warm and dry.  Neurological:     Mental Status: He is alert and oriented to person, place, and time.     UC Treatments / Results  Labs (all labs ordered are listed, but only abnormal results are displayed) Labs Reviewed  POCT URINALYSIS DIP (MANUAL ENTRY)    EKG   Radiology No results found.  Procedures Procedures (including critical care time)  Medications Ordered in UC Medications - No data to display  Initial Impression / Assessment and Plan / UC Course  I have reviewed the triage vital signs and the nursing notes.  Pertinent labs & imaging results that were available during my care of the patient were reviewed by me and considered in my medical decision making (see chart for details).  Afebrile, well  appearing. BP elevated 168/98, hx HTN. Labs 2 weeks ago were normal per primary. Does not take meds. Will monitor. Has follow up in 1 week   Able to provide urine sample after 1  cup of water in clinic. Full stream and no dysuria. No bladder pressure or pain. UA is unremarkable, no infection. Advised increase fluids, start his daily Flomax.  Constipation Laxative taken about 3 hours ago. Advised it can take longer for BM. Will increase fluids today and monitor for BM this afternoon. If none, advised try another dose of laxative.  Return and ED precautions discussed. Patient agrees to plan. All questions answered  Final Clinical Impressions(s) / UC Diagnoses   Final diagnoses:  Constipation, unspecified constipation type  Urinary dysfunction     Discharge Instructions      Your urine sample was good and does not show any infection. I am glad you were able to urinate! Keep drinking lots of water and fluids throughout the day to keep your urinary tract moving.  Please take your Flomax (tamsulosin) once every single day. This will help to promote urination  Since you took the osmotic laxative around 8:30 this morning, you may not have a bowel movement until this afternoon. It can take 6-10 hours for the laxative to work.  If you do not have a bowel movement before bedtime tonight, you can try another dose of the laxative (powder dissolved in one cup of water). That will make you have a bowel movement tomorrow morning.  If you develop any pain or discomfort in the abdomen or bladder, please return for evaluation.      ED Prescriptions   None    PDMP not reviewed this encounter.   Madi Bonfiglio, Lurena Joiner, [place] 12/11/22 1428

## 2022-12-20 ENCOUNTER — Ambulatory Visit (INDEPENDENT_AMBULATORY_CARE_PROVIDER_SITE_OTHER): Payer: Medicare HMO

## 2022-12-20 DIAGNOSIS — I639 Cerebral infarction, unspecified: Secondary | ICD-10-CM

## 2023-01-04 NOTE — Progress Notes (Signed)
Carelink Summary Report / Loop Recorder 

## 2023-01-24 ENCOUNTER — Ambulatory Visit: Payer: Medicare HMO

## 2023-01-24 DIAGNOSIS — I639 Cerebral infarction, unspecified: Secondary | ICD-10-CM

## 2023-01-24 LAB — CUP PACEART REMOTE DEVICE CHECK
Date Time Interrogation Session: 20240906230517
Implantable Pulse Generator Implant Date: 20220901

## 2023-02-10 NOTE — Progress Notes (Signed)
Carelink Summary Report / Loop Recorder 

## 2023-02-28 ENCOUNTER — Ambulatory Visit (INDEPENDENT_AMBULATORY_CARE_PROVIDER_SITE_OTHER): Payer: Medicare HMO

## 2023-02-28 DIAGNOSIS — I639 Cerebral infarction, unspecified: Secondary | ICD-10-CM | POA: Diagnosis not present

## 2023-02-28 LAB — CUP PACEART REMOTE DEVICE CHECK
Date Time Interrogation Session: 20241013230704
Implantable Pulse Generator Implant Date: 20220901

## 2023-03-17 NOTE — Progress Notes (Signed)
Carelink Summary Report / Loop Recorder 

## 2023-03-29 ENCOUNTER — Telehealth: Payer: Self-pay | Admitting: Student

## 2023-03-29 NOTE — Telephone Encounter (Signed)
LVM on pt's phone to let them know we switched them to an appt on Friday 11/15 at 8:30am for a new pt appt with Dr. Elease Hashimoto. Left detailed instructions on how to get to our building and told them to call us with any questions.

## 2023-03-29 NOTE — Telephone Encounter (Signed)
Spoke with wife and she states patient went to see PCP his chest and he is not back yet from the doctor

## 2023-03-29 NOTE — Telephone Encounter (Signed)
   Pt c/o of Chest Pain: STAT if active CP, including tightness, pressure, jaw pain, radiating pain to shoulder/upper arm/back, CP unrelieved by Nitro. Symptoms reported of SOB, nausea, vomiting, sweating.  1. Are you having CP right now? Now; this morning    2. Are you experiencing any other symptoms (ex. SOB, nausea, vomiting, sweating)? No    3. Is your CP continuous or coming and going? Coming and going   4. Have you taken Nitroglycerin? No    5. How long have you been experiencing CP? 2 to 3 weeks    6. If NO CP at time of call then end call with telling Pt to call back or call 911 if Chest pain returns prior to return call from triage team.

## 2023-03-30 ENCOUNTER — Ambulatory Visit: Payer: Medicare HMO | Admitting: Cardiology

## 2023-03-31 ENCOUNTER — Encounter: Payer: Self-pay | Admitting: Cardiovascular Disease

## 2023-03-31 NOTE — Progress Notes (Signed)
  Cardiology Office Note:  .   Date:  04/01/2023  ID:  Samuel Mata, DOB 1930/12/17, MRN [id number] PCP: [facility]  [facility] Providers Cardiologist:  None    History of Present Illness: .   Samuel Mata is a 89 y.o. male with hx of stroke , HLD ,HTN,  recent chest pain   Seen with daughter, Samuel Mata   Has been having some chest pain  Soreness  He thought he may have pulled some muscles  He was at his ENT appt and reported having some chest pain  Walks 20 minutes a day  Exercises in the morning when he first get up in the morning  Thinks he may have pulled something    Previously seen by [facility] Has an implantable loop recorder   Is retired from [employer] business         ROS:   Studies Reviewed: Marland Kitchen   EKG Interpretation Date/Time:  Friday April 01 2023 08:43:27 EST Ventricular Rate:  78 PR Interval:  148 QRS Duration:  102 QT Interval:  364 QTC Calculation: 414 R Axis:   -67  Text Interpretation: Normal sinus rhythm Incomplete right bundle branch block Left anterior fascicular block Moderate voltage criteria for LVH, may be normal variant ( R in aVL , Cornell product ) When compared with ECG of 13-Jan-2021 15:02, No significant change since last tracing Confirmed by [name] ([id number]) on 04/01/2023 8:56:28 AM     Risk Assessment/Calculations:             Physical Exam:   VS:  BP 130/72   Pulse 75   Ht 5\' 7"  (1.702 m)   Wt 130 lb 3.2 oz (59.1 kg)   SpO2 94%   BMI 20.39 kg/m    Wt Readings from Last 3 Encounters:  04/01/23 130 lb 3.2 oz (59.1 kg)  12/17/21 127 lb (57.6 kg)  10/22/21 121 lb (54.9 kg)    GEN: Well nourished, well developed in no acute distress NECK: No JVD; No carotid bruits CARDIAC: RRR, no murmurs, rubs, gallops RESPIRATORY:  Clear to auscultation without rales, wheezing or rhonchi  ABDOMEN: Soft, non-tender, non-distended EXTREMITIES:  No edema; No deformity   ASSESSMENT  AND PLAN: .   Chest discomfort: [name] presents today for evaluation of an episode of chest pain that occurred several days ago.  He clearly has at least mild to moderate dementia and his memory is not all that good.  He does not remember if he did anything to cause the chest pain.  He thought that it was a pulled muscle.  He is EKG is non acute but does have  nonspecific TWI .  He is not had any chest pain for the past couple of days.  He is 89 years old and is becoming somewhat frail.  While I would not rule out the possibility of doing an invasive heart catheterization with possible PCI, I am not sure that he is a candidate for CABG .  Will try adding metoprolol XL 25 mg a day.  I will give him a prescription for nitroglycerin to use as needed.  If he starts having worsening chest pain then we can consider heart catheterization realizing that his interventional options are somewhat limited.  I will see him in 3 months for repeat evaluation.         Dispo: 3 months    Signed, [name], MD

## 2023-04-01 ENCOUNTER — Encounter: Payer: Medicare HMO | Admitting: Cardiovascular Disease

## 2023-04-01 ENCOUNTER — Ambulatory Visit: Payer: Medicare HMO | Attending: Cardiovascular Disease | Admitting: Cardiovascular Disease

## 2023-04-01 ENCOUNTER — Encounter: Payer: Self-pay | Admitting: Cardiovascular Disease

## 2023-04-01 VITALS — BP 130/72 | HR 75 | Ht 67.0 in | Wt 130.2 lb

## 2023-04-01 DIAGNOSIS — I10 Essential (primary) hypertension: Secondary | ICD-10-CM | POA: Diagnosis not present

## 2023-04-01 MED ORDER — METOPROLOL SUCCINATE ER 25 MG PO TB24: 25.0000 mg | ORAL_TABLET | Freq: Every day | ORAL | 2 refills | Status: DC

## 2023-04-01 MED ORDER — NITROGLYCERIN 0.4 MG SL SUBL: 0.4000 mg | SUBLINGUAL_TABLET | SUBLINGUAL | 4 refills | Status: DC | PRN

## 2023-04-01 NOTE — Patient Instructions (Signed)
Medication Instructions:   START TAKING METOPROLOL SUCCINATE (TOPROL XL) 25 MG BY MOUTH DAILY  START TAKING NITROGLYCERIN 0.4 MG --PLACE ONE TABLET UNDER THE TONGUE EVERY 5 MINS AS NEEDED FOR CHEST PAIN--IF YOU ARE GETTING TO TABLET #3 WITH NO CHEST PAIN RELIEF, PLEASE CALL 911 AND GO TO THE ER TO BE EVALUATED  *If you need a refill on your cardiac medications before your next appointment, please call your pharmacy*    Follow-Up:  3 MONTHS WITH DR. Elease Hashimoto

## 2023-04-04 ENCOUNTER — Ambulatory Visit (INDEPENDENT_AMBULATORY_CARE_PROVIDER_SITE_OTHER): Payer: Medicare HMO

## 2023-04-04 DIAGNOSIS — I639 Cerebral infarction, unspecified: Secondary | ICD-10-CM

## 2023-04-04 LAB — CUP PACEART REMOTE DEVICE CHECK
Date Time Interrogation Session: 20241117231319
Implantable Pulse Generator Implant Date: 20220901

## 2023-04-07 ENCOUNTER — Inpatient Hospital Stay (HOSPITAL_COMMUNITY)
Admission: EM | Admit: 2023-04-07 | Discharge: 2023-04-12 | DRG: 065 | Disposition: A | Discharge status: Rehabilitation Facility | Payer: Medicare HMO | Attending: Internal Medicine | Admitting: Internal Medicine

## 2023-04-07 ENCOUNTER — Encounter (HOSPITAL_COMMUNITY): Payer: Self-pay

## 2023-04-07 ENCOUNTER — Other Ambulatory Visit: Payer: Self-pay

## 2023-04-07 DIAGNOSIS — I6381 Other cerebral infarction due to occlusion or stenosis of small artery: Secondary | ICD-10-CM | POA: Diagnosis not present

## 2023-04-07 DIAGNOSIS — Z8673 Personal history of transient ischemic attack (TIA), and cerebral infarction without residual deficits: Secondary | ICD-10-CM

## 2023-04-07 DIAGNOSIS — K219 Gastro-esophageal reflux disease without esophagitis: Secondary | ICD-10-CM | POA: Diagnosis present

## 2023-04-07 DIAGNOSIS — E785 Hyperlipidemia, unspecified: Secondary | ICD-10-CM | POA: Diagnosis present

## 2023-04-07 DIAGNOSIS — Z9081 Acquired absence of spleen: Secondary | ICD-10-CM

## 2023-04-07 DIAGNOSIS — I10 Essential (primary) hypertension: Secondary | ICD-10-CM | POA: Diagnosis present

## 2023-04-07 DIAGNOSIS — N400 Enlarged prostate without lower urinary tract symptoms: Secondary | ICD-10-CM | POA: Diagnosis present

## 2023-04-07 DIAGNOSIS — J449 Chronic obstructive pulmonary disease, unspecified: Secondary | ICD-10-CM | POA: Diagnosis present

## 2023-04-07 DIAGNOSIS — R29810 Facial weakness: Secondary | ICD-10-CM | POA: Diagnosis present

## 2023-04-07 DIAGNOSIS — E119 Type 2 diabetes mellitus without complications: Secondary | ICD-10-CM | POA: Diagnosis present

## 2023-04-07 DIAGNOSIS — Z7982 Long term (current) use of aspirin: Secondary | ICD-10-CM

## 2023-04-07 DIAGNOSIS — H9190 Unspecified hearing loss, unspecified ear: Secondary | ICD-10-CM | POA: Diagnosis present

## 2023-04-07 DIAGNOSIS — Z87442 Personal history of urinary calculi: Secondary | ICD-10-CM

## 2023-04-07 DIAGNOSIS — R918 Other nonspecific abnormal finding of lung field: Secondary | ICD-10-CM | POA: Diagnosis present

## 2023-04-07 DIAGNOSIS — R29700 NIHSS score 0: Secondary | ICD-10-CM | POA: Diagnosis present

## 2023-04-07 DIAGNOSIS — Z885 Allergy status to narcotic agent status: Secondary | ICD-10-CM

## 2023-04-07 DIAGNOSIS — R29702 NIHSS score 2: Secondary | ICD-10-CM | POA: Diagnosis not present

## 2023-04-07 DIAGNOSIS — G8314 Monoplegia of lower limb affecting left nondominant side: Secondary | ICD-10-CM | POA: Diagnosis not present

## 2023-04-07 DIAGNOSIS — R414 Neurologic neglect syndrome: Secondary | ICD-10-CM | POA: Diagnosis present

## 2023-04-07 DIAGNOSIS — G4733 Obstructive sleep apnea (adult) (pediatric): Secondary | ICD-10-CM | POA: Diagnosis present

## 2023-04-07 DIAGNOSIS — E871 Hypo-osmolality and hyponatremia: Secondary | ICD-10-CM | POA: Diagnosis present

## 2023-04-07 DIAGNOSIS — D580 Hereditary spherocytosis: Secondary | ICD-10-CM | POA: Diagnosis present

## 2023-04-07 DIAGNOSIS — I639 Cerebral infarction, unspecified: Principal | ICD-10-CM | POA: Diagnosis present

## 2023-04-07 DIAGNOSIS — R1310 Dysphagia, unspecified: Secondary | ICD-10-CM | POA: Diagnosis present

## 2023-04-07 DIAGNOSIS — Z79899 Other long term (current) drug therapy: Secondary | ICD-10-CM

## 2023-04-07 DIAGNOSIS — Z87891 Personal history of nicotine dependence: Secondary | ICD-10-CM

## 2023-04-07 HISTORY — DX: Unspecified hearing loss, unspecified ear: H91.90

## 2023-04-07 HISTORY — DX: Bronchiectasis, uncomplicated: J47.9

## 2023-04-07 NOTE — ED Provider Notes (Signed)
[place] EMERGENCY DEPARTMENT AT [facility] Provider Note   CSN: [id number] Arrival date & time: 04/07/23  2325     History  Chief Complaint  Patient presents with   Leg Numbness    Samuel Mata is a 89 y.o. male.  The history is provided by the patient, medical records and a relative.  Samuel Mata is a 89 y.o. male who presents to the Emergency Department complaining of numbness.  He presents to the emergency department for evaluation of left leg numbness.  Exact time of onset is unclear.  He feels like something may have not been right yesterday but states today he was unable to walk with his cane at baseline and required a walker because he could not feel his left leg and it would not go where he told it to go.  He was unable to get off the ground to get into bed, which prompted his wife to call 911.  No reported true fall or injury.  He denies any pain, confusion, recent illnesses.  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     Home Medications Prior to Admission medications   Medication Sig Start Date End Date Taking? Authorizing Provider  acetaminophen (TYLENOL) 325 MG tablet Take 650 mg by mouth as needed for headache.   Yes [provider]  aspirin EC 81 MG EC tablet Take 1 tablet (81 mg total) by mouth daily. Swallow whole. 01/15/21  Yes Suzan Garibaldi, PA-C  atorvastatin (LIPITOR) 40 MG tablet Take 1 tablet (40 mg total) by mouth daily. 09/17/21  Yes McCue, Shanda Bumps, NP  Calcium Carbonate Antacid (TUMS PO) Take 1 tablet by mouth daily as needed (for heartburn).   Yes [provider]  fluticasone (FLONASE) 50 MCG/ACT nasal spray Place 2 sprays into both nostrils daily. 03/15/23  Yes [provider]  metoprolol succinate (TOPROL XL) 25 MG 24 hr tablet Take 1 tablet (25 mg total) by mouth daily. 04/01/23  Yes Nahser, Deloris Ping, MD  Naphazoline HCl (CLEAR EYES OP) Place 1 drop into both eyes at  bedtime.   Yes [provider]  nitroGLYCERIN (NITROSTAT) 0.4 MG SL tablet Place 1 tablet (0.4 mg total) under the tongue every 5 (five) minutes as needed for chest pain. 04/01/23  Yes Nahser, Deloris Ping, MD  sodium chloride (OCEAN) 0.65 % nasal spray Place 1 spray into the nose as needed for congestion.   Yes [provider]  tamsulosin (FLOMAX) 0.4 MG CAPS capsule Take 0.4 mg by mouth daily. 07/13/22  Yes [provider]      Allergies    Codeine    Review of Systems   Review of Systems  All other systems reviewed and are negative.   Physical Exam Updated Vital Signs BP (!) 159/89   Pulse 60   Temp 98.5 F (36.9 C) (Oral)   Resp 18   Ht 5\' 7"  (1.702 m)   Wt 59 kg   SpO2 95%   BMI 20.36 kg/m  Physical Exam Vitals and nursing note reviewed.  Constitutional:      Appearance: He is well-developed.  HENT:     Head: Normocephalic and atraumatic.  Cardiovascular:     Rate and Rhythm: Normal rate and regular rhythm.     Heart sounds: No murmur heard. Pulmonary:     Effort: Pulmonary effort is normal. No respiratory distress.     Breath sounds: Normal breath sounds.  Abdominal:  Palpations: Abdomen is soft.     Tenderness: There is no abdominal tenderness. There is no guarding or rebound.  Musculoskeletal:        General: No tenderness.  Skin:    General: Skin is warm and dry.  Neurological:     Mental Status: He is alert and oriented to person, place, and time.     Comments: No asymmetry of facial movements.  5 out of 5 strength in bilateral upper extremities.  There is left lower extremity drift, 5 out of 5 strength in the right lower extremity.  Sensation to light touch intact in all 4 extremities.  Visual fields grossly intact.  Psychiatric:        Behavior: Behavior normal.     ED Results / Procedures / Treatments   Labs (all labs ordered are listed, but only abnormal results are displayed) Labs Reviewed  DIFFERENTIAL - Abnormal;  Notable for the following components:      Result Value   Monocytes Absolute 1.5 (*)    All other components within normal limits  COMPREHENSIVE METABOLIC PANEL - Abnormal; Notable for the following components:   Albumin 3.0 (*)    All other components within normal limits  ETHANOL  PROTIME-INR  APTT  CBC  RAPID URINE DRUG SCREEN, HOSP PERFORMED  URINALYSIS, ROUTINE W REFLEX MICROSCOPIC  I-STAT CHEM 8, ED    EKG EKG Interpretation Date/Time:  Thursday April 07 2023 23:55:37 EST Ventricular Rate:  67 PR Interval:  188 QRS Duration:  112 QT Interval:  407 QTC Calculation: 430 R Axis:   -70  Text Interpretation: Sinus rhythm Ventricular premature complex LAE, consider biatrial enlargement Incomplete RBBB and LAFB Left ventricular hypertrophy Confirmed by Tilden Fossa [id number]) on 04/08/2023 12:10:11 AM  Radiology MR BRAIN WO CONTRAST  Result Date: 04/08/2023 CLINICAL DATA:  Initial evaluation for acute neuro deficit, stroke. EXAM: MRI HEAD WITHOUT CONTRAST TECHNIQUE: Multiplanar, multiecho pulse sequences of the brain and surrounding structures were obtained without intravenous contrast. COMPARISON:  Prior CTs from earlier the same day. FINDINGS: Brain: Generalized age-related cerebral atrophy. Patchy T2/FLAIR hyperintensity involving the periventricular and deep white matter both cerebral hemispheres, consistent with chronic small vessel ischemic disease, moderately advanced in nature. Mild patchy involvement of the pons noted. Few small remote bilateral cerebellar infarcts noted. 9 mm focus of restricted diffusion seen involving the right thalamocapsular region, consistent with a small acute ischemic infarct. No associated hemorrhage or mass effect. No other acute or subacute ischemia. Gray-white matter differentiation otherwise maintained. No acute intracranial hemorrhage. Single chronic microhemorrhage noted at the left parietal lobe. No mass lesion, midline shift or mass effect.  No hydrocephalus or extra-axial fluid collection. Pituitary gland and suprasellar region within normal limits. Vascular: Major intracranial vascular flow voids are maintained. Skull and upper cervical spine: Craniocervical junction within normal limits. Bone marrow signal intensity normal. No scalp soft tissue abnormality. Sinuses/Orbits: Globes and orbital soft tissues within normal limits. Paranasal sinuses are largely clear. No significant mastoid effusion. Other: None. IMPRESSION: 1. 9 mm acute ischemic nonhemorrhagic right thalamocapsular infarct. 2. Underlying age-related cerebral atrophy with moderate chronic microvascular ischemic disease, with a few small remote bilateral cerebellar infarcts. Electronically Signed   By: Rise Mu M.D.   On: 04/08/2023 03:53   DG Chest Port 1 View  Result Date: 04/08/2023 CLINICAL DATA:  Weakness EXAM: PORTABLE CHEST 1 VIEW COMPARISON:  07/22/2020 FINDINGS: Cardiac shadow is within normal limits. Loop recorder is now seen. Lungs are well aerated bilaterally. Diffuse fibrotic changes  are noted bilaterally. A somewhat nodular density is noted in the right upper lobe incompletely evaluated on this exam. No sizable effusion is seen. No other focal abnormality is noted. IMPRESSION: Nodular density in the right upper lobe incompletely evaluated on this exam. CT of the chest would be helpful for further evaluation. Chronic fibrotic changes. Electronically Signed   By: Alcide Clever M.D.   On: 04/08/2023 02:10   CT HEAD WO CONTRAST  Result Date: 04/08/2023 CLINICAL DATA:  Initial evaluation for neuro deficit, stroke suspected, left-sided weakness. EXAM: CT ANGIOGRAPHY HEAD AND NECK TECHNIQUE: Multidetector CT imaging of the head and neck was performed using the standard protocol during bolus administration of intravenous contrast. Multiplanar CT image reconstructions and MIPs were obtained to evaluate the vascular anatomy. Carotid stenosis measurements (when  applicable) are obtained utilizing NASCET criteria, using the distal internal carotid diameter as the denominator. RADIATION DOSE REDUCTION: This exam was performed according to the departmental dose-optimization program which includes automated exposure control, adjustment of the mA and/or kV according to patient size and/or use of iterative reconstruction technique. CONTRAST:  75mL OMNIPAQUE IOHEXOL 350 MG/ML SOLN COMPARISON:  Prior study from 01/13/2021. FINDINGS: CT HEAD FINDINGS Brain: Cerebral volume within normal limits. Mild chronic microvascular ischemic disease. Small remote left cerebellar infarct. No acute intracranial hemorrhage. No acute large vessel territory infarct. No mass lesion or midline shift. No hydrocephalus or extra-axial fluid collection. Vascular: No abnormal hyperdense vessel. Calcified atherosclerosis present at skull base. Skull: Scalp soft tissues within normal limits.  Calvarium intact. Sinuses/Orbits: Globes and orbital soft tissues within normal limits. Paranasal sinuses are largely clear. No mastoid effusion. Other: None. Review of the MIP images confirms the above findings CTA NECK FINDINGS Aortic arch: Visualized aortic arch within normal limits for caliber with standard branch pattern. Aortic atherosclerosis. No significant stenosis about the origin the great vessels. Right carotid system: Right common and internal carotid arteries are patent without dissection or stenosis. Left carotid system: Left common and internal carotid arteries are patent without dissection or stenosis. Vertebral arteries: Both vertebral arteries arise from the subclavian arteries. Atheromatous change at the origin of the right vertebral artery with moderate stenosis. Additional focal moderate stenosis involving the contralateral left V1 segment noted. Vertebral arteries otherwise patent without dissection. Skeleton: No discrete or worrisome osseous lesions. Moderate spondylosis present at C5-6 and C6-7.  Other neck: No other acute finding. Upper chest: Multifocal patchy and nodular densities within the visualized lungs, nonspecific, and could be either infectious or inflammatory in nature. Review of the MIP images confirms the above findings CTA HEAD FINDINGS Anterior circulation: Mild atheromatous change about the carotid siphons without hemodynamically significant stenosis. A1 segments patent bilaterally. Normal anterior communicating artery complex. Anterior cerebral arteries patent without stenosis. No M1 stenosis or occlusion. Distal MCA branches perfused and symmetric. Posterior circulation: Both V4 segments widely patent. Left vertebral artery slightly dominant. Both PICA patent. Basilar patent without stenosis. Superior cerebellar and posterior cerebral arteries patent bilaterally. Venous sinuses: Grossly patent allowing for timing the contrast bolus. Anatomic variants: None significant.  No aneurysm. Review of the MIP images confirms the above findings IMPRESSION: CT HEAD: 1. No acute intracranial abnormality. 2. 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 CTA HEAD AND NECK: 1. Negative CTA for large vessel occlusion or other emergent finding. 2. Moderate bilateral V1 stenoses. 3. Mild atheromatous change about the carotid bifurcations and carotid siphons without hemodynamically significant stenosis. 4. Multifocal patchy and nodular densities within the visualized lungs,  nonspecific, and could be either infectious or inflammatory in nature. Findings are incompletely assessed on this exam, and correlation with dedicated chest CT recommended. 5.  Aortic Atherosclerosis (ICD10-I70.0). Electronically Signed   By: Rise Mu M.D.   On: 04/08/2023 01:49   CT ANGIO HEAD NECK W WO CM  Result Date: 04/08/2023 CLINICAL DATA:  Initial evaluation for neuro deficit, stroke suspected, left-sided weakness. EXAM: CT ANGIOGRAPHY HEAD AND NECK TECHNIQUE: Multidetector  CT imaging of the head and neck was performed using the standard protocol during bolus administration of intravenous contrast. Multiplanar CT image reconstructions and MIPs were obtained to evaluate the vascular anatomy. Carotid stenosis measurements (when applicable) are obtained utilizing NASCET criteria, using the distal internal carotid diameter as the denominator. RADIATION DOSE REDUCTION: This exam was performed according to the departmental dose-optimization program which includes automated exposure control, adjustment of the mA and/or kV according to patient size and/or use of iterative reconstruction technique. CONTRAST:  75mL OMNIPAQUE IOHEXOL 350 MG/ML SOLN COMPARISON:  Prior study from 01/13/2021. FINDINGS: CT HEAD FINDINGS Brain: Cerebral volume within normal limits. Mild chronic microvascular ischemic disease. Small remote left cerebellar infarct. No acute intracranial hemorrhage. No acute large vessel territory infarct. No mass lesion or midline shift. No hydrocephalus or extra-axial fluid collection. Vascular: No abnormal hyperdense vessel. Calcified atherosclerosis present at skull base. Skull: Scalp soft tissues within normal limits.  Calvarium intact. Sinuses/Orbits: Globes and orbital soft tissues within normal limits. Paranasal sinuses are largely clear. No mastoid effusion. Other: None. Review of the MIP images confirms the above findings CTA NECK FINDINGS Aortic arch: Visualized aortic arch within normal limits for caliber with standard branch pattern. Aortic atherosclerosis. No significant stenosis about the origin the great vessels. Right carotid system: Right common and internal carotid arteries are patent without dissection or stenosis. Left carotid system: Left common and internal carotid arteries are patent without dissection or stenosis. Vertebral arteries: Both vertebral arteries arise from the subclavian arteries. Atheromatous change at the origin of the right vertebral artery with  moderate stenosis. Additional focal moderate stenosis involving the contralateral left V1 segment noted. Vertebral arteries otherwise patent without dissection. Skeleton: No discrete or worrisome osseous lesions. Moderate spondylosis present at C5-6 and C6-7. Other neck: No other acute finding. Upper chest: Multifocal patchy and nodular densities within the visualized lungs, nonspecific, and could be either infectious or inflammatory in nature. Review of the MIP images confirms the above findings CTA HEAD FINDINGS Anterior circulation: Mild atheromatous change about the carotid siphons without hemodynamically significant stenosis. A1 segments patent bilaterally. Normal anterior communicating artery complex. Anterior cerebral arteries patent without stenosis. No M1 stenosis or occlusion. Distal MCA branches perfused and symmetric. Posterior circulation: Both V4 segments widely patent. Left vertebral artery slightly dominant. Both PICA patent. Basilar patent without stenosis. Superior cerebellar and posterior cerebral arteries patent bilaterally. Venous sinuses: Grossly patent allowing for timing the contrast bolus. Anatomic variants: None significant.  No aneurysm. Review of the MIP images confirms the above findings IMPRESSION: CT HEAD: 1. No acute intracranial abnormality. 2. 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 CTA HEAD AND NECK: 1. Negative CTA for large vessel occlusion or other emergent finding. 2. Moderate bilateral V1 stenoses. 3. Mild atheromatous change about the carotid bifurcations and carotid siphons without hemodynamically significant stenosis. 4. Multifocal patchy and nodular densities within the visualized lungs, nonspecific, and could be either infectious or inflammatory in nature. Findings are incompletely assessed on this exam, and correlation with dedicated chest CT recommended.  5.  Aortic Atherosclerosis (ICD10-I70.0). Electronically Signed   By:  Rise Mu M.D.   On: 04/08/2023 01:49    Procedures Procedures    Medications Ordered in ED Medications  aspirin EC tablet 81 mg (has no administration in time range)  clopidogrel (PLAVIX) tablet 75 mg (has no administration in time range)  iohexol (OMNIPAQUE) 350 MG/ML injection 75 mL (75 mLs Intravenous Contrast Given 04/08/23 0043)  clopidogrel (PLAVIX) tablet 300 mg (300 mg Oral Given 04/08/23 0102)    ED Course/ Medical Decision Making/ A&P                                 Medical Decision Making Amount and/or Complexity of Data Reviewed Labs: ordered. Radiology: ordered.  Risk Prescription drug management. Decision regarding hospitalization.   Patient with history of prior CVA here for evaluation of left lower extremity weakness, exactly unclear when this started, but he is not in the thrombolytic window.  He is LVO negative.  CTA head and neck does demonstrate some patchy changes to the lungs.  Patient does report that he has been coughing for a very long time, no current respiratory distress.  Chest x-ray also demonstrates diffuse fibrotic changes.  MRI demonstrates acute infarct.  Discussed with Dr. Amada Jupiter with neurology-will see the patient in consult.  Hospitalist consulted for admission for ongoing care.  Patient and daughter updated findings of studies and they are in agreement with admission.        Final Clinical Impression(s) / ED Diagnoses Final diagnoses:  Acute CVA (cerebrovascular accident) [facility])    Rx / DC Orders ED Discharge Orders     None         Tilden Fossa, MD 04/08/23 [phone]

## 2023-04-07 NOTE — ED Triage Notes (Signed)
Patient BIB [facility] from home due to L leg numbness. Upon arrival to room patient states numbness has improved. Patient denies pain to such leg. Hx of stroke with no deficits. Patient not on thinners. Patient is A&Ox4.

## 2023-04-08 ENCOUNTER — Emergency Department (HOSPITAL_COMMUNITY): Payer: Medicare HMO

## 2023-04-08 ENCOUNTER — Inpatient Hospital Stay (HOSPITAL_COMMUNITY): Payer: Medicare HMO

## 2023-04-08 ENCOUNTER — Encounter (HOSPITAL_COMMUNITY): Payer: Self-pay | Admitting: Internal Medicine

## 2023-04-08 DIAGNOSIS — Z87442 Personal history of urinary calculi: Secondary | ICD-10-CM | POA: Diagnosis not present

## 2023-04-08 DIAGNOSIS — N400 Enlarged prostate without lower urinary tract symptoms: Secondary | ICD-10-CM | POA: Diagnosis present

## 2023-04-08 DIAGNOSIS — Z9081 Acquired absence of spleen: Secondary | ICD-10-CM | POA: Diagnosis not present

## 2023-04-08 DIAGNOSIS — H9190 Unspecified hearing loss, unspecified ear: Secondary | ICD-10-CM | POA: Diagnosis present

## 2023-04-08 DIAGNOSIS — I639 Cerebral infarction, unspecified: Principal | ICD-10-CM | POA: Diagnosis present

## 2023-04-08 DIAGNOSIS — E119 Type 2 diabetes mellitus without complications: Secondary | ICD-10-CM | POA: Diagnosis present

## 2023-04-08 DIAGNOSIS — R001 Bradycardia, unspecified: Secondary | ICD-10-CM | POA: Diagnosis not present

## 2023-04-08 DIAGNOSIS — R414 Neurologic neglect syndrome: Secondary | ICD-10-CM | POA: Diagnosis present

## 2023-04-08 DIAGNOSIS — Z79899 Other long term (current) drug therapy: Secondary | ICD-10-CM | POA: Diagnosis not present

## 2023-04-08 DIAGNOSIS — H1031 Unspecified acute conjunctivitis, right eye: Secondary | ICD-10-CM | POA: Diagnosis not present

## 2023-04-08 DIAGNOSIS — I10 Essential (primary) hypertension: Secondary | ICD-10-CM | POA: Diagnosis present

## 2023-04-08 DIAGNOSIS — R29702 NIHSS score 2: Secondary | ICD-10-CM | POA: Diagnosis not present

## 2023-04-08 DIAGNOSIS — R29810 Facial weakness: Secondary | ICD-10-CM | POA: Diagnosis present

## 2023-04-08 DIAGNOSIS — I6381 Other cerebral infarction due to occlusion or stenosis of small artery: Secondary | ICD-10-CM | POA: Diagnosis present

## 2023-04-08 DIAGNOSIS — Z885 Allergy status to narcotic agent status: Secondary | ICD-10-CM | POA: Diagnosis not present

## 2023-04-08 DIAGNOSIS — G4733 Obstructive sleep apnea (adult) (pediatric): Secondary | ICD-10-CM | POA: Diagnosis present

## 2023-04-08 DIAGNOSIS — K219 Gastro-esophageal reflux disease without esophagitis: Secondary | ICD-10-CM | POA: Diagnosis present

## 2023-04-08 DIAGNOSIS — E785 Hyperlipidemia, unspecified: Secondary | ICD-10-CM | POA: Diagnosis present

## 2023-04-08 DIAGNOSIS — R29700 NIHSS score 0: Secondary | ICD-10-CM | POA: Diagnosis present

## 2023-04-08 DIAGNOSIS — E871 Hypo-osmolality and hyponatremia: Secondary | ICD-10-CM | POA: Diagnosis present

## 2023-04-08 DIAGNOSIS — Z7982 Long term (current) use of aspirin: Secondary | ICD-10-CM | POA: Diagnosis not present

## 2023-04-08 DIAGNOSIS — R1310 Dysphagia, unspecified: Secondary | ICD-10-CM | POA: Diagnosis present

## 2023-04-08 DIAGNOSIS — I69392 Facial weakness following cerebral infarction: Secondary | ICD-10-CM | POA: Diagnosis not present

## 2023-04-08 DIAGNOSIS — I6389 Other cerebral infarction: Secondary | ICD-10-CM

## 2023-04-08 DIAGNOSIS — Z87891 Personal history of nicotine dependence: Secondary | ICD-10-CM | POA: Diagnosis not present

## 2023-04-08 DIAGNOSIS — D580 Hereditary spherocytosis: Secondary | ICD-10-CM | POA: Diagnosis present

## 2023-04-08 DIAGNOSIS — Z8673 Personal history of transient ischemic attack (TIA), and cerebral infarction without residual deficits: Secondary | ICD-10-CM | POA: Diagnosis not present

## 2023-04-08 DIAGNOSIS — F05 Delirium due to known physiological condition: Secondary | ICD-10-CM | POA: Diagnosis not present

## 2023-04-08 DIAGNOSIS — G8314 Monoplegia of lower limb affecting left nondominant side: Secondary | ICD-10-CM | POA: Diagnosis present

## 2023-04-08 LAB — DIFFERENTIAL
Abs Immature Granulocytes: 0.02 10*3/uL (ref 0.00–0.07)
Basophils Absolute: 0.1 10*3/uL (ref 0.0–0.1)
Basophils Relative: 1 %
Eosinophils Absolute: 0.3 10*3/uL (ref 0.0–0.5)
Eosinophils Relative: 4 %
Immature Granulocytes: 0 %
Lymphocytes Relative: 17 %
Lymphs Abs: 1.5 10*3/uL (ref 0.7–4.0)
Monocytes Absolute: 1.5 10*3/uL — ABNORMAL HIGH (ref 0.1–1.0)
Monocytes Relative: 17 %
Neutro Abs: 5.6 10*3/uL (ref 1.7–7.7)
Neutrophils Relative %: 61 %

## 2023-04-08 LAB — CBC
HCT: 43.8 % (ref 39.0–52.0)
Hemoglobin: 14.5 g/dL (ref 13.0–17.0)
MCH: 30.9 pg (ref 26.0–34.0)
MCHC: 33.1 g/dL (ref 30.0–36.0)
MCV: 93.2 fL (ref 80.0–100.0)
Platelets: 361 10*3/uL (ref 150–400)
RBC: 4.7 MIL/uL (ref 4.22–5.81)
RDW: 12 % (ref 11.5–15.5)
WBC: 9 10*3/uL (ref 4.0–10.5)
nRBC: 0 % (ref 0.0–0.2)

## 2023-04-08 LAB — LIPID PANEL
Cholesterol: 153 mg/dL (ref 0–200)
HDL: 49 mg/dL (ref >40)
LDL Cholesterol: 98 mg/dL (ref 0–99)
Total CHOL/HDL Ratio: 3.1 {ratio}
Triglycerides: 31 mg/dL (ref <150)
VLDL: 6 mg/dL (ref 0–40)

## 2023-04-08 LAB — RAPID URINE DRUG SCREEN, HOSP PERFORMED
Amphetamines: NOT DETECTED
Barbiturates: NOT DETECTED
Benzodiazepines: NOT DETECTED
Cocaine: NOT DETECTED
Opiates: NOT DETECTED
Tetrahydrocannabinol: NOT DETECTED

## 2023-04-08 LAB — COMPREHENSIVE METABOLIC PANEL
ALT: 11 U/L (ref 0–44)
AST: 21 U/L (ref 15–41)
Albumin: 3 g/dL — ABNORMAL LOW (ref 3.5–5.0)
Alkaline Phosphatase: 60 U/L (ref 38–126)
Anion gap: 10 (ref 5–15)
BUN: 18 mg/dL (ref 8–23)
CO2: 23 mmol/L (ref 22–32)
Calcium: 9 mg/dL (ref 8.9–10.3)
Chloride: 104 mmol/L (ref 98–111)
Creatinine, Ser: 1.04 mg/dL (ref 0.61–1.24)
GFR, Estimated: >60 mL/min (ref >60)
Glucose, Bld: 99 mg/dL (ref 70–99)
Potassium: 4.2 mmol/L (ref 3.5–5.1)
Sodium: 137 mmol/L (ref 135–145)
Total Bilirubin: 0.6 mg/dL (ref <1.2)
Total Protein: 6.7 g/dL (ref 6.5–8.1)

## 2023-04-08 LAB — ECHOCARDIOGRAM COMPLETE
Area-P 1/2: 1.98 cm2
Calc EF: 57.9 %
Height: 67 in
MV M vel: 5.02 m/s
MV Peak grad: 100.8 mm[Hg]
MV VTI: 2.77 cm2
S' Lateral: 3 cm
Single Plane A2C EF: 61.4 %
Single Plane A4C EF: 57.6 %
Weight: 2080 [oz_av]

## 2023-04-08 LAB — URINALYSIS, ROUTINE W REFLEX MICROSCOPIC
Bilirubin Urine: NEGATIVE
Glucose, UA: NEGATIVE mg/dL
Hgb urine dipstick: NEGATIVE
Ketones, ur: NEGATIVE mg/dL
Leukocytes,Ua: NEGATIVE
Nitrite: NEGATIVE
Protein, ur: NEGATIVE mg/dL
Specific Gravity, Urine: 1.01 (ref 1.005–1.030)
pH: 7 (ref 5.0–8.0)

## 2023-04-08 LAB — I-STAT CHEM 8, ED
BUN: 21 mg/dL (ref 8–23)
Calcium, Ion: 1.17 mmol/L (ref 1.15–1.40)
Chloride: 102 mmol/L (ref 98–111)
Creatinine, Ser: 0.9 mg/dL (ref 0.61–1.24)
Glucose, Bld: 98 mg/dL (ref 70–99)
HCT: 44 % (ref 39.0–52.0)
Hemoglobin: 15 g/dL (ref 13.0–17.0)
Potassium: 4.3 mmol/L (ref 3.5–5.1)
Sodium: 138 mmol/L (ref 135–145)
TCO2: 27 mmol/L (ref 22–32)

## 2023-04-08 LAB — PROTIME-INR
INR: 1.1 (ref 0.8–1.2)
Prothrombin Time: 14.8 s (ref 11.4–15.2)

## 2023-04-08 LAB — APTT: aPTT: 30 s (ref 24–36)

## 2023-04-08 LAB — HEMOGLOBIN A1C
Hgb A1c MFr Bld: 5.4 % (ref 4.8–5.6)
Mean Plasma Glucose: 108.28 mg/dL

## 2023-04-08 LAB — ETHANOL: Alcohol, Ethyl (B): <10 mg/dL (ref <10)

## 2023-04-08 MED ORDER — CLOPIDOGREL BISULFATE 300 MG PO TABS
300.0000 mg | ORAL_TABLET | Freq: Once | ORAL | Status: AC
  Administered 2023-04-08: 300 mg via ORAL
  Filled 2023-04-08: qty 1

## 2023-04-08 MED ORDER — ASPIRIN 81 MG PO TBEC
81.0000 mg | DELAYED_RELEASE_TABLET | Freq: Every day | ORAL | Status: DC
  Administered 2023-04-08 – 2023-04-12 (×5): 81 mg via ORAL
  Filled 2023-04-08 (×5): qty 1

## 2023-04-08 MED ORDER — CLOPIDOGREL BISULFATE 75 MG PO TABS
75.0000 mg | ORAL_TABLET | Freq: Every day | ORAL | Status: DC
  Administered 2023-04-09 – 2023-04-12 (×4): 75 mg via ORAL
  Filled 2023-04-08 (×4): qty 1

## 2023-04-08 MED ORDER — ATORVASTATIN CALCIUM 80 MG PO TABS
80.0000 mg | ORAL_TABLET | Freq: Every day | ORAL | Status: DC
  Administered 2023-04-08: 80 mg via ORAL
  Filled 2023-04-08: qty 1

## 2023-04-08 MED ORDER — HEPARIN SODIUM (PORCINE) 5000 UNIT/ML IJ SOLN
5000.0000 [IU] | Freq: Three times a day (TID) | INTRAMUSCULAR | Status: DC
  Administered 2023-04-08 – 2023-04-12 (×13): 5000 [IU] via SUBCUTANEOUS
  Filled 2023-04-08 (×13): qty 1

## 2023-04-08 MED ORDER — IOHEXOL 350 MG/ML SOLN
75.0000 mL | Freq: Once | INTRAVENOUS | Status: AC | PRN
  Administered 2023-04-08: 75 mL via INTRAVENOUS

## 2023-04-08 NOTE — H&P (Signed)
History and Physical    Erasto Kuehler EGB:[id number] DOB: [birth year] DOA: 04/07/2023  PCP: [facility]   Chief Complaint: Leg weakness, left  HPI: Samuel Mata is a 89 y.o. male with medical history significant of COPD, hypertension, GERD, hereditary spherocytosis, sleep apnea who presents with left leg weakness.  No other acute complaints or recent issues, imaging in the ED confirmed acute CVA, neurology consulted and hospitalist called for admission.  Review of Systems: As per HPI otherwise unremarkable.   Assessment/Plan Principal Problem:   Acute CVA (cerebrovascular accident) [facility]) Active Problems:   Chronic obstructive pulmonary disease (HCC)   Essential hypertension   Gastroesophageal reflux disease without esophagitis   Hereditary spherocytosis (HCC)   History of tobacco use   OSA (obstructive sleep apnea)   Pulmonary infiltrates    Acute CVA -Neurology following, appreciate insight recommendations -Confirmed 9 mm acute ischemic nonhemorrhagic right thalamocapsular infarct on MRI -Aspirin/Plavix x 21 days then continue Plavix as single agent -PT OT speech following -Echo pending -A1c/LDL stable -continue appropriate diet and exercise regimen as tolerated  Hypertension -Permissive hypertension ongoing, defer to neurology for reinitiation timing of home meds  Sleep apnea -Recommend bring home CPAP to hospital  COPD, without acute exacerbation -On room air, continue to follow  GERD -Continue to monitor  Hereditary spherocytosis - stable  DVT prophylaxis: heparin injection 5,000 Units Start: 04/08/23 0815 SCDs Start: 04/08/23 3710 Code Status: Full Family Communication: Discussed with family at bedside, daughter  Status is: Inpatient  Dispo: The patient is from: Home              Anticipated d/c is to: To be determined              Anticipated d/c date is: 24 to 48 hours              Patient currently not medically  stable for discharge given ongoing need for workup and evaluation by neurology  Consultants:  Neurology  Procedures:  None   Past Medical History:  Diagnosis Date   Enlarged prostate    Hypertension    Renal stones    Stroke [facility])     Past Surgical History:  Procedure Laterality Date   APPENDECTOMY     CHOLECYSTECTOMY     LOOP RECORDER INSERTION N/A 01/15/2021   Procedure: LOOP RECORDER INSERTION;  Surgeon: Hillis Range, MD;  Location: [facility] INVASIVE CV LAB;  Service: Cardiovascular;  Laterality: N/A;   NOSE SURGERY     SPINE SURGERY     compressoin fx T12, kyphoplasty   SPLENECTOMY, TOTAL       reports that he quit smoking about 38 years ago. His smoking use included cigarettes. He started smoking about 68 years ago. He has a 30 pack-year smoking history. He has never used smokeless tobacco. He reports that he does not drink alcohol and does not use drugs.  Allergies  Allergen Reactions   Codeine     Unknown reaction    Family History  Problem Relation Age of Onset   Lung disease Neg Hx     Prior to Admission medications   Medication Sig Start Date End Date Taking? Authorizing Provider  acetaminophen (TYLENOL) 325 MG tablet Take 650 mg by mouth as needed for headache.   Yes [provider]  aspirin EC 81 MG EC tablet Take 1 tablet (81 mg total) by mouth daily. Swallow whole. 01/15/21  Yes Suzan Garibaldi, PA-C  atorvastatin (LIPITOR) 40 MG tablet Take  1 tablet (40 mg total) by mouth daily. 09/17/21  Yes McCue, Shanda Bumps, NP  Calcium Carbonate Antacid (TUMS PO) Take 1 tablet by mouth daily as needed (for heartburn).   Yes [provider]  fluticasone (FLONASE) 50 MCG/ACT nasal spray Place 2 sprays into both nostrils daily. 03/15/23  Yes [provider]  metoprolol succinate (TOPROL XL) 25 MG 24 hr tablet Take 1 tablet (25 mg total) by mouth daily. 04/01/23  Yes Nahser, Deloris Ping, MD  Naphazoline HCl (CLEAR EYES OP) Place 1 drop into both eyes at  bedtime.   Yes [provider]  nitroGLYCERIN (NITROSTAT) 0.4 MG SL tablet Place 1 tablet (0.4 mg total) under the tongue every 5 (five) minutes as needed for chest pain. 04/01/23  Yes Nahser, Deloris Ping, MD  sodium chloride (OCEAN) 0.65 % nasal spray Place 1 spray into the nose as needed for congestion.   Yes [provider]  tamsulosin (FLOMAX) 0.4 MG CAPS capsule Take 0.4 mg by mouth daily. 07/13/22  Yes [provider]    Physical Exam: Vitals:   04/08/23 0820 04/08/23 1000 04/08/23 1100 04/08/23 1237  BP:  (!) 142/83 (!) 185/92   Pulse:  (!) 58 60   Resp:  18 (!) 22   Temp: 97.6 F (36.4 C)   97.6 F (36.4 C)  TempSrc: Oral   Oral  SpO2:  96% 96%   Weight:      Height:        Constitutional: NAD, calm, comfortable Vitals:   04/08/23 0820 04/08/23 1000 04/08/23 1100 04/08/23 1237  BP:  (!) 142/83 (!) 185/92   Pulse:  (!) 58 60   Resp:  18 (!) 22   Temp: 97.6 F (36.4 C)   97.6 F (36.4 C)  TempSrc: Oral   Oral  SpO2:  96% 96%   Weight:      Height:       General:  Pleasantly resting in bed, No acute distress. HEENT:  Normocephalic atraumatic.  Sclerae nonicteric, noninjected.  Extraocular movements intact bilaterally. Neck:  Without mass or deformity.  Trachea is midline. Lungs:  Clear to auscultate bilaterally without rhonchi, wheeze, or rales. Heart:  Regular rate and rhythm.  Without murmurs, rubs, or gallops. Abdomen:  Soft, nontender, nondistended.  Without guarding or rebound. Extremities: Without cyanosis, clubbing, edema, or obvious deformity. Vascular:  Dorsalis pedis and posterior tibial pulses palpable bilaterally. Skin:  Warm and dry, no erythema, no ulcerations.  Labs on Admission: I have personally reviewed following labs and imaging studies  CBC: Recent Labs  Lab 04/08/23 0000  WBC 9.0  NEUTROABS 5.6  HGB 14.5  15.0  HCT 43.8  44.0  MCV 93.2  PLT 361   Basic Metabolic Panel: Recent Labs  Lab 04/08/23 0000  NA  137  138  K 4.2  4.3  CL 104  102  CO2 23  GLUCOSE 99  98  BUN 18  21  CREATININE 1.04  0.90  CALCIUM 9.0   GFR: Estimated Creatinine Clearance: 46.4 mL/min (by C-G formula based on SCr of 0.9 mg/dL). Liver Function Tests: Recent Labs  Lab 04/08/23 0000  AST 21  ALT 11  ALKPHOS 60  BILITOT 0.6  PROT 6.7  ALBUMIN 3.0*   No results for input(s): "LIPASE", "AMYLASE" in the last 168 hours. No results for input(s): "AMMONIA" in the last 168 hours. Coagulation Profile: Recent Labs  Lab 04/08/23 0000  INR 1.1   Cardiac Enzymes: No results for input(s): "CKTOTAL", "  CKMB", "CKMBINDEX", "TROPONINI" in the last 168 hours. BNP (last 3 results) No results for input(s): "PROBNP" in the last 8760 hours. HbA1C: Recent Labs    04/08/23 0924  HGBA1C 5.4   CBG: No results for input(s): "GLUCAP" in the last 168 hours. Lipid Profile: Recent Labs    04/08/23 0924  CHOL 153  HDL 49  LDLCALC 98  TRIG 31  CHOLHDL 3.1   Thyroid Function Tests: No results for input(s): "TSH", "T4TOTAL", "FREET4", "T3FREE", "THYROIDAB" in the last 72 hours. Anemia Panel: No results for input(s): "VITAMINB12", "FOLATE", "FERRITIN", "TIBC", "IRON", "RETICCTPCT" in the last 72 hours. Urine analysis:    Component Value Date/Time   COLORURINE YELLOW 04/08/2023 0000   APPEARANCEUR CLEAR 04/08/2023 0000   LABSPEC 1.010 04/08/2023 0000   PHURINE 7.0 04/08/2023 0000   GLUCOSEU NEGATIVE 04/08/2023 0000   HGBUR NEGATIVE 04/08/2023 0000   BILIRUBINUR NEGATIVE 04/08/2023 0000   BILIRUBINUR negative 12/11/2022 1225   KETONESUR NEGATIVE 04/08/2023 0000   PROTEINUR NEGATIVE 04/08/2023 0000   UROBILINOGEN 0.2 12/11/2022 1225   NITRITE NEGATIVE 04/08/2023 0000   LEUKOCYTESUR NEGATIVE 04/08/2023 0000    Radiological Exams on Admission: MR BRAIN WO CONTRAST  Result Date: 04/08/2023 CLINICAL DATA:  Initial evaluation for acute neuro deficit, stroke. EXAM: MRI HEAD WITHOUT CONTRAST TECHNIQUE:  Multiplanar, multiecho pulse sequences of the brain and surrounding structures were obtained without intravenous contrast. COMPARISON:  Prior CTs from earlier the same day. FINDINGS: Brain: Generalized age-related cerebral atrophy. Patchy T2/FLAIR hyperintensity involving the periventricular and deep white matter both cerebral hemispheres, consistent with chronic small vessel ischemic disease, moderately advanced in nature. Mild patchy involvement of the pons noted. Few small remote bilateral cerebellar infarcts noted. 9 mm focus of restricted diffusion seen involving the right thalamocapsular region, consistent with a small acute ischemic infarct. No associated hemorrhage or mass effect. No other acute or subacute ischemia. Gray-white matter differentiation otherwise maintained. No acute intracranial hemorrhage. Single chronic microhemorrhage noted at the left parietal lobe. No mass lesion, midline shift or mass effect. No hydrocephalus or extra-axial fluid collection. Pituitary gland and suprasellar region within normal limits. Vascular: Major intracranial vascular flow voids are maintained. Skull and upper cervical spine: Craniocervical junction within normal limits. Bone marrow signal intensity normal. No scalp soft tissue abnormality. Sinuses/Orbits: Globes and orbital soft tissues within normal limits. Paranasal sinuses are largely clear. No significant mastoid effusion. Other: None. IMPRESSION: 1. 9 mm acute ischemic nonhemorrhagic right thalamocapsular infarct. 2. Underlying age-related cerebral atrophy with moderate chronic microvascular ischemic disease, with a few small remote bilateral cerebellar infarcts. Electronically Signed   By: Rise Mu M.D.   On: 04/08/2023 03:53   DG Chest Port 1 View  Result Date: 04/08/2023 CLINICAL DATA:  Weakness EXAM: PORTABLE CHEST 1 VIEW COMPARISON:  07/22/2020 FINDINGS: Cardiac shadow is within normal limits. Loop recorder is now seen. Lungs are well  aerated bilaterally. Diffuse fibrotic changes are noted bilaterally. A somewhat nodular density is noted in the right upper lobe incompletely evaluated on this exam. No sizable effusion is seen. No other focal abnormality is noted. IMPRESSION: Nodular density in the right upper lobe incompletely evaluated on this exam. CT of the chest would be helpful for further evaluation. Chronic fibrotic changes. Electronically Signed   By: Alcide Clever M.D.   On: 04/08/2023 02:10   CT HEAD WO CONTRAST  Result Date: 04/08/2023 CLINICAL DATA:  Initial evaluation for neuro deficit, stroke suspected, left-sided weakness. EXAM: CT ANGIOGRAPHY HEAD AND NECK TECHNIQUE: Multidetector CT imaging  of the head and neck was performed using the standard protocol during bolus administration of intravenous contrast. Multiplanar CT image reconstructions and MIPs were obtained to evaluate the vascular anatomy. Carotid stenosis measurements (when applicable) are obtained utilizing NASCET criteria, using the distal internal carotid diameter as the denominator. RADIATION DOSE REDUCTION: This exam was performed according to the departmental dose-optimization program which includes automated exposure control, adjustment of the mA and/or kV according to patient size and/or use of iterative reconstruction technique. CONTRAST:  75mL OMNIPAQUE IOHEXOL 350 MG/ML SOLN COMPARISON:  Prior study from 01/13/2021. FINDINGS: CT HEAD FINDINGS Brain: Cerebral volume within normal limits. Mild chronic microvascular ischemic disease. Small remote left cerebellar infarct. No acute intracranial hemorrhage. No acute large vessel territory infarct. No mass lesion or midline shift. No hydrocephalus or extra-axial fluid collection. Vascular: No abnormal hyperdense vessel. Calcified atherosclerosis present at skull base. Skull: Scalp soft tissues within normal limits.  Calvarium intact. Sinuses/Orbits: Globes and orbital soft tissues within normal limits. Paranasal  sinuses are largely clear. No mastoid effusion. Other: None. Review of the MIP images confirms the above findings CTA NECK FINDINGS Aortic arch: Visualized aortic arch within normal limits for caliber with standard branch pattern. Aortic atherosclerosis. No significant stenosis about the origin the great vessels. Right carotid system: Right common and internal carotid arteries are patent without dissection or stenosis. Left carotid system: Left common and internal carotid arteries are patent without dissection or stenosis. Vertebral arteries: Both vertebral arteries arise from the subclavian arteries. Atheromatous change at the origin of the right vertebral artery with moderate stenosis. Additional focal moderate stenosis involving the contralateral left V1 segment noted. Vertebral arteries otherwise patent without dissection. Skeleton: No discrete or worrisome osseous lesions. Moderate spondylosis present at C5-6 and C6-7. Other neck: No other acute finding. Upper chest: Multifocal patchy and nodular densities within the visualized lungs, nonspecific, and could be either infectious or inflammatory in nature. Review of the MIP images confirms the above findings CTA HEAD FINDINGS Anterior circulation: Mild atheromatous change about the carotid siphons without hemodynamically significant stenosis. A1 segments patent bilaterally. Normal anterior communicating artery complex. Anterior cerebral arteries patent without stenosis. No M1 stenosis or occlusion. Distal MCA branches perfused and symmetric. Posterior circulation: Both V4 segments widely patent. Left vertebral artery slightly dominant. Both PICA patent. Basilar patent without stenosis. Superior cerebellar and posterior cerebral arteries patent bilaterally. Venous sinuses: Grossly patent allowing for timing the contrast bolus. Anatomic variants: None significant.  No aneurysm. Review of the MIP images confirms the above findings IMPRESSION: CT HEAD: 1. No acute  intracranial abnormality. 2. 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 CTA HEAD AND NECK: 1. Negative CTA for large vessel occlusion or other emergent finding. 2. Moderate bilateral V1 stenoses. 3. Mild atheromatous change about the carotid bifurcations and carotid siphons without hemodynamically significant stenosis. 4. Multifocal patchy and nodular densities within the visualized lungs, nonspecific, and could be either infectious or inflammatory in nature. Findings are incompletely assessed on this exam, and correlation with dedicated chest CT recommended. 5.  Aortic Atherosclerosis (ICD10-I70.0). Electronically Signed   By: Rise Mu M.D.   On: 04/08/2023 01:49   CT ANGIO HEAD NECK W WO CM  Result Date: 04/08/2023 CLINICAL DATA:  Initial evaluation for neuro deficit, stroke suspected, left-sided weakness. EXAM: CT ANGIOGRAPHY HEAD AND NECK TECHNIQUE: Multidetector CT imaging of the head and neck was performed using the standard protocol during bolus administration of intravenous contrast. Multiplanar CT image reconstructions and MIPs were  obtained to evaluate the vascular anatomy. Carotid stenosis measurements (when applicable) are obtained utilizing NASCET criteria, using the distal internal carotid diameter as the denominator. RADIATION DOSE REDUCTION: This exam was performed according to the departmental dose-optimization program which includes automated exposure control, adjustment of the mA and/or kV according to patient size and/or use of iterative reconstruction technique. CONTRAST:  75mL OMNIPAQUE IOHEXOL 350 MG/ML SOLN COMPARISON:  Prior study from 01/13/2021. FINDINGS: CT HEAD FINDINGS Brain: Cerebral volume within normal limits. Mild chronic microvascular ischemic disease. Small remote left cerebellar infarct. No acute intracranial hemorrhage. No acute large vessel territory infarct. No mass lesion or midline shift. No hydrocephalus or  extra-axial fluid collection. Vascular: No abnormal hyperdense vessel. Calcified atherosclerosis present at skull base. Skull: Scalp soft tissues within normal limits.  Calvarium intact. Sinuses/Orbits: Globes and orbital soft tissues within normal limits. Paranasal sinuses are largely clear. No mastoid effusion. Other: None. Review of the MIP images confirms the above findings CTA NECK FINDINGS Aortic arch: Visualized aortic arch within normal limits for caliber with standard branch pattern. Aortic atherosclerosis. No significant stenosis about the origin the great vessels. Right carotid system: Right common and internal carotid arteries are patent without dissection or stenosis. Left carotid system: Left common and internal carotid arteries are patent without dissection or stenosis. Vertebral arteries: Both vertebral arteries arise from the subclavian arteries. Atheromatous change at the origin of the right vertebral artery with moderate stenosis. Additional focal moderate stenosis involving the contralateral left V1 segment noted. Vertebral arteries otherwise patent without dissection. Skeleton: No discrete or worrisome osseous lesions. Moderate spondylosis present at C5-6 and C6-7. Other neck: No other acute finding. Upper chest: Multifocal patchy and nodular densities within the visualized lungs, nonspecific, and could be either infectious or inflammatory in nature. Review of the MIP images confirms the above findings CTA HEAD FINDINGS Anterior circulation: Mild atheromatous change about the carotid siphons without hemodynamically significant stenosis. A1 segments patent bilaterally. Normal anterior communicating artery complex. Anterior cerebral arteries patent without stenosis. No M1 stenosis or occlusion. Distal MCA branches perfused and symmetric. Posterior circulation: Both V4 segments widely patent. Left vertebral artery slightly dominant. Both PICA patent. Basilar patent without stenosis. Superior  cerebellar and posterior cerebral arteries patent bilaterally. Venous sinuses: Grossly patent allowing for timing the contrast bolus. Anatomic variants: None significant.  No aneurysm. Review of the MIP images confirms the above findings IMPRESSION: CT HEAD: 1. No acute intracranial abnormality. 2. 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 CTA HEAD AND NECK: 1. Negative CTA for large vessel occlusion or other emergent finding. 2. Moderate bilateral V1 stenoses. 3. Mild atheromatous change about the carotid bifurcations and carotid siphons without hemodynamically significant stenosis. 4. Multifocal patchy and nodular densities within the visualized lungs, nonspecific, and could be either infectious or inflammatory in nature. Findings are incompletely assessed on this exam, and correlation with dedicated chest CT recommended. 5.  Aortic Atherosclerosis (ICD10-I70.0). Electronically Signed   By: Rise Mu M.D.   On: 04/08/2023 01:49    EKG: Independently reviewed.   Azucena Fallen DO [facility] For contact please use secure messenger on Epic  If 7PM-7AM, please contact night-coverage located on [email or web address]   04/08/2023, 3:50 PM

## 2023-04-08 NOTE — Consult Note (Signed)
NEUROLOGY CONSULT NOTE   Date of service: April 08, 2023 Patient Name: Tavonte Crudele MRN:  [id number] DOB:  1931-01-29 Chief Complaint: "Left leg weakness" Requesting Provider: Tilden Fossa, MD  History of Present Illness  Gregoire Favinger is a 89 y.o. male  has a past medical history of Enlarged prostate, Hypertension, Renal stones, and Stroke [facility]).   He states that he has been having left leg weakness for a couple of days.  He states that it is "just not doing what he wants to do" when asked exactly when it started, he is not certain, stating only "a few days."  He denies visual change, nausea, vomiting, dysarthria, or other symptoms.  LKW: Unclear IV Thrombolysis: Unclear time of onset  EVT: No LVO ICH  NIHSS 2(left facial weakness and left leg drift)   Past History   Past Medical History:  Diagnosis Date   Enlarged prostate    Hypertension    Renal stones    Stroke [facility])     Past Surgical History:  Procedure Laterality Date   APPENDECTOMY     CHOLECYSTECTOMY     LOOP RECORDER INSERTION N/A 01/15/2021   Procedure: LOOP RECORDER INSERTION;  Surgeon: Hillis Range, MD;  Location: [facility] INVASIVE [facility];  Service: Cardiovascular;  Laterality: N/A;   NOSE SURGERY     SPINE SURGERY     compressoin fx T12, kyphoplasty   SPLENECTOMY, TOTAL      Family History: Family History  Problem Relation Age of Onset   Lung disease Neg Hx     Social History  reports that he quit smoking about 38 years ago. His smoking use included cigarettes. He started smoking about 68 years ago. He has a 30 pack-year smoking history. He has never used smokeless tobacco. He reports that he does not drink alcohol and does not use drugs.  Allergies  Allergen Reactions   Codeine     Unknown reaction    Medications  No current facility-administered medications for this encounter.  Current Outpatient Medications:    acetaminophen (TYLENOL) 325 MG tablet, Take 650 mg by mouth as needed for  headache., Disp: , Rfl:    aspirin EC 81 MG EC tablet, Take 1 tablet (81 mg total) by mouth daily. Swallow whole., Disp: 30 tablet, Rfl: 11   atorvastatin (LIPITOR) 40 MG tablet, Take 1 tablet (40 mg total) by mouth daily., Disp: 90 tablet, Rfl: 3   Calcium Carbonate Antacid (TUMS PO), Take 1 tablet by mouth daily as needed (for heartburn)., Disp: , Rfl:    fluticasone (FLONASE) 50 MCG/ACT nasal spray, Place 2 sprays into both nostrils daily., Disp: , Rfl:    metoprolol succinate (TOPROL XL) 25 MG 24 hr tablet, Take 1 tablet (25 mg total) by mouth daily., Disp: 90 tablet, Rfl: 2   Naphazoline HCl (CLEAR EYES OP), Place 1 drop into both eyes at bedtime., Disp: , Rfl:    nitroGLYCERIN (NITROSTAT) 0.4 MG SL tablet, Place 1 tablet (0.4 mg total) under the tongue every 5 (five) minutes as needed for chest pain., Disp: 25 tablet, Rfl: 4   sodium chloride (OCEAN) 0.65 % nasal spray, Place 1 spray into the nose as needed for congestion., Disp: , Rfl:    tamsulosin (FLOMAX) 0.4 MG CAPS capsule, Take 0.4 mg by mouth daily., Disp: , Rfl:   Vitals   Vitals:   04/08/23 0200 04/08/23 0350 04/08/23 0400 04/08/23 0500  BP: (!) 173/92  (!) 168/84 (!) 159/89  Pulse: 65  61 60  Resp: 17  (!) 22 18  Temp:  98.5 F (36.9 C)    TempSrc:  Oral    SpO2: 93%  95% 95%  Weight:      Height:        Body mass index is 20.36 kg/m.  Physical Exam   Constitutional: Appears well-developed and well-nourished.  Neurologic Examination    Neuro: Mental Status: Patient is awake, alert, oriented to person, place, month, year, and situation. Patient is able to give a clear and coherent history. No signs of aphasia or neglect Cranial Nerves: II: Visual Fields are full. Pupils are equal, round, and reactive to light.   III,IV, VI: EOMI without ptosis or diploplia.  V: Facial sensation is symmetric to temperature VII: Facial movement with mild left facial weakness VIII: hearing is intact to voice X: Uvula elevates  symmetrically XII: tongue is midline without atrophy or fasciculations.  Motor: Tone is normal. Bulk is normal. 5/5 strength was present on the right. He has very mild left arm extension weakness, and 4+/5 left leg weakness Sensory: Sensation is symmetric to light touch and temperature in the arms and legs. Cerebellar: FNF intact bilaterally   Labs/Imaging/Neurodiagnostic studies   CBC:  Recent Labs  Lab May 04, 2023 0000  WBC 9.0  NEUTROABS 5.6  HGB 14.5  15.0  HCT 43.8  44.0  MCV 93.2  PLT 361   Basic Metabolic Panel:  Lab Results  Component Value Date   NA 138 05/04/23   NA 137 May 04, 2023   K 4.3 05-04-23   K 4.2 04-May-2023   CO2 23 May 04, 2023   GLUCOSE 98 05/04/2023   GLUCOSE 99 04-May-2023   BUN 21 05/04/23   BUN 18 2023-05-04   CREATININE 0.90 05-04-2023   CREATININE 1.04 05-04-2023   CALCIUM 9.0 04-May-2023   GFRNONAA >60 05-04-2023   GFRAA >60 09/14/2019   Lipid Panel:  Lab Results  Component Value Date   LDLCALC 121 (H) 01/14/2021   HgbA1c:  Lab Results  Component Value Date   HGBA1C 5.5 01/14/2021   Urine Drug Screen:     Component Value Date/Time   LABOPIA NONE DETECTED May 04, 2023 0000   COCAINSCRNUR NONE DETECTED 2023/05/04 0000   LABBENZ NONE DETECTED 05/04/2023 0000   AMPHETMU NONE DETECTED 2023/05/04 0000   THCU NONE DETECTED 05/04/2023 0000   LABBARB NONE DETECTED 05/04/2023 0000    Alcohol Level     Component Value Date/Time   ETH <10 2023-05-04 0000   INR  Lab Results  Component Value Date   INR 1.1 May 04, 2023   APTT  Lab Results  Component Value Date   APTT 30 04-May-2023   AED levels: No results found for: "PHENYTOIN", "ZONISAMIDE", "LAMOTRIGINE", "LEVETIRACETA"   CT angio Head and Neck with contrast(Personally reviewed): No lvo  MRI Brain(Personally reviewed): Subcortical infarct on the right  ASSESSMENT   Brance Heidelberg is a 89 y.o. male  has a past medical history of Enlarged prostate, Hypertension, Renal  stones, and Stroke (HCC).  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  Will need to be admitted for secondary risk factor modification and therapy evaluations.  RECOMMENDATIONS  - HgbA1c, fasting lipid panel - Frequent neuro checks - Echocardiogram - Prophylactic therapy-Antiplatelet med: Aspirin - dose 81mg  and plavix 75mg  daily  after 300mg  load  - Risk factor modification - Telemetry monitoring - PT consult, OT consult, Speech consult - Stroke team to follow  ______________________________________________________________________    Signed, Ritta Slot, MD [facility]

## 2023-04-08 NOTE — Progress Notes (Signed)
  Echocardiogram 2D Echocardiogram has been performed.  Samuel Mata 04/08/2023, 3:42 PM

## 2023-04-08 NOTE — ED Notes (Signed)
Patient transported to MRI 

## 2023-04-08 NOTE — ED Notes (Signed)
ED TO INPATIENT HANDOFF REPORT  ED Nurse Name and Phone #: Angelica Chessman, [phone]  S Name/Age/Gender Gilmer Mor 89 y.o. male Room/Bed: 018C/018C  Code Status   Code Status: Full Code  Home/SNF/Other Home Patient oriented to: self, place, time, and situation Is this baseline? Yes   Triage Complete: Triage complete  Chief Complaint Acute CVA (cerebrovascular accident) [facility]) [I63.9]  Triage Note Patient BIB [facility] from home due to L leg numbness. Upon arrival to room patient states numbness has improved. Patient denies pain to such leg. Hx of stroke with no deficits. Patient not on thinners. Patient is A&Ox4.   Allergies Allergies  Allergen Reactions   Codeine     Unknown reaction    Level of Care/Admitting Diagnosis ED Disposition     ED Disposition  Admit   Condition  --   Comment  Hospital Area: [facility] [[id number]]  Level of Care: Telemetry Medical [104]  May admit patient to [facility] or [facility] if equivalent level of care is available:: No  Covid Evaluation: Confirmed COVID Negative  Diagnosis: Acute CVA (cerebrovascular accident) [facility]) [[id number]]  Admitting Physician: Azucena Fallen [[id number]]  Attending Physician: Azucena Fallen [[id number]]  Certification:: I certify this patient will need inpatient services for at least 2 midnights  Expected Medical Readiness: 04/10/2023          B Medical/Surgery History Past Medical History:  Diagnosis Date   Enlarged prostate    Hypertension    Renal stones    Stroke [facility])    Past Surgical History:  Procedure Laterality Date   APPENDECTOMY     CHOLECYSTECTOMY     LOOP RECORDER INSERTION N/A 01/15/2021   Procedure: LOOP RECORDER INSERTION;  Surgeon: Hillis Range, MD;  Location: [facility] INVASIVE CV LAB;  Service: Cardiovascular;  Laterality: N/A;   NOSE SURGERY     SPINE SURGERY     compressoin fx T12, kyphoplasty   SPLENECTOMY, TOTAL       A IV Location/Drains/Wounds Patient  Lines/Drains/Airways Status     Active Line/Drains/Airways     Name Placement date Placement time Site Days   Peripheral IV 04/07/23 20 G Anterior;Left;Proximal Forearm 04/07/23  2339  Forearm  1            Intake/Output Last 24 hours No intake or output data in the 24 hours ending 04/08/23 1322  Labs/Imaging Results for orders placed or performed during the hospital encounter of 04/07/23 (from the past 48 hour(s))  I-stat chem 8, ED     Status: None   Collection Time: 04/08/23 12:00 AM  Result Value Ref Range   Sodium 138 135 - 145 mmol/L   Potassium 4.3 3.5 - 5.1 mmol/L   Chloride 102 98 - 111 mmol/L   BUN 21 8 - 23 mg/dL   Creatinine, Ser 1.30 0.61 - 1.24 mg/dL   Glucose, Bld 98 70 - 99 mg/dL    Comment: Glucose reference range applies only to samples taken after fasting for at least 8 hours.   Calcium, Ion 1.17 1.15 - 1.40 mmol/L   TCO2 27 22 - 32 mmol/L   Hemoglobin 15.0 13.0 - 17.0 g/dL   HCT 86.5 78.4 - 69.6 %  Ethanol     Status: None   Collection Time: 04/08/23 12:00 AM  Result Value Ref Range   Alcohol, Ethyl (B) <10 <10 mg/dL    Comment: (NOTE) Lowest detectable limit for serum alcohol is 10 mg/dL.  For medical purposes only. Performed at  [facility], [address] [place], [place] [address]   Protime-INR     Status: None   Collection Time: 04/08/23 12:00 AM  Result Value Ref Range   Prothrombin Time 14.8 11.4 - 15.2 seconds   INR 1.1 0.8 - 1.2    Comment: (NOTE) INR goal varies based on device and disease states. Performed at [facility], [address], [place], [place] [address]   APTT     Status: None   Collection Time: 04/08/23 12:00 AM  Result Value Ref Range   aPTT 30 24 - 36 seconds    Comment: Performed at [facility], [address], [place], [place] [address]  CBC     Status: None   Collection Time: 04/08/23 12:00 AM  Result Value Ref Range   WBC 9.0 4.0 - 10.5 K/uL   RBC 4.70 4.22 - 5.81 MIL/uL   Hemoglobin  14.5 13.0 - 17.0 g/dL   HCT 27.2 53.6 - 64.4 %   MCV 93.2 80.0 - 100.0 fL   MCH 30.9 26.0 - 34.0 pg   MCHC 33.1 30.0 - 36.0 g/dL   RDW 03.4 74.2 - 59.5 %   Platelets 361 150 - 400 K/uL   nRBC 0.0 0.0 - 0.2 %    Comment: Performed at [facility], [address] [place], [place] [address]  Differential     Status: Abnormal   Collection Time: 04/08/23 12:00 AM  Result Value Ref Range   Neutrophils Relative % 61 %   Neutro Abs 5.6 1.7 - 7.7 K/uL   Lymphocytes Relative 17 %   Lymphs Abs 1.5 0.7 - 4.0 K/uL   Monocytes Relative 17 %   Monocytes Absolute 1.5 (H) 0.1 - 1.0 K/uL   Eosinophils Relative 4 %   Eosinophils Absolute 0.3 0.0 - 0.5 K/uL   Basophils Relative 1 %   Basophils Absolute 0.1 0.0 - 0.1 K/uL   Immature Granulocytes 0 %   Abs Immature Granulocytes 0.02 0.00 - 0.07 K/uL    Comment: Performed at [facility], [address] [place], [place] [address]  Comprehensive metabolic panel     Status: Abnormal   Collection Time: 04/08/23 12:00 AM  Result Value Ref Range   Sodium 137 135 - 145 mmol/L   Potassium 4.2 3.5 - 5.1 mmol/L   Chloride 104 98 - 111 mmol/L   CO2 23 22 - 32 mmol/L   Glucose, Bld 99 70 - 99 mg/dL    Comment: Glucose reference range applies only to samples taken after fasting for at least 8 hours.   BUN 18 8 - 23 mg/dL   Creatinine, Ser 9.51 0.61 - 1.24 mg/dL   Calcium 9.0 8.9 - 88.4 mg/dL   Total Protein 6.7 6.5 - 8.1 g/dL   Albumin 3.0 (L) 3.5 - 5.0 g/dL   AST 21 15 - 41 U/L   ALT 11 0 - 44 U/L   Alkaline Phosphatase 60 38 - 126 U/L   Total Bilirubin 0.6 <1.2 mg/dL   GFR, Estimated >16 >60 mL/min    Comment: (NOTE) Calculated using the CKD-EPI Creatinine Equation (2021)    Anion gap 10 5 - 15    Comment: Performed at [facility], [address] [place], [place] [address]  Urine rapid drug screen (hosp performed)     Status: None   Collection Time: 04/08/23 12:00 AM  Result Value Ref Range   Opiates NONE  DETECTED NONE  DETECTED   Cocaine NONE DETECTED NONE DETECTED   Benzodiazepines NONE DETECTED NONE DETECTED   Amphetamines NONE DETECTED NONE DETECTED   Tetrahydrocannabinol NONE DETECTED NONE DETECTED   Barbiturates NONE DETECTED NONE DETECTED    Comment: (NOTE) DRUG SCREEN FOR MEDICAL PURPOSES ONLY.  IF CONFIRMATION IS NEEDED FOR ANY PURPOSE, NOTIFY LAB WITHIN 5 DAYS.  LOWEST DETECTABLE LIMITS FOR URINE DRUG SCREEN Drug Class                     Cutoff (ng/mL) Amphetamine and metabolites    1000 Barbiturate and metabolites    200 Benzodiazepine                 200 Opiates and metabolites        300 Cocaine and metabolites        300 THC                            50 Performed at [facility], [address], [place], [place] [address]   Urinalysis, Routine w reflex microscopic -Urine, Clean Catch     Status: None   Collection Time: 04/08/23 12:00 AM  Result Value Ref Range   Color, Urine YELLOW YELLOW   APPearance CLEAR CLEAR   Specific Gravity, Urine 1.010 1.005 - 1.030   pH 7.0 5.0 - 8.0   Glucose, UA NEGATIVE NEGATIVE mg/dL   Hgb urine dipstick NEGATIVE NEGATIVE   Bilirubin Urine NEGATIVE NEGATIVE   Ketones, ur NEGATIVE NEGATIVE mg/dL   Protein, ur NEGATIVE NEGATIVE mg/dL   Nitrite NEGATIVE NEGATIVE   Leukocytes,Ua NEGATIVE NEGATIVE    Comment: Performed at [facility], [address] [place], [place] [address]  Hemoglobin A1c     Status: None   Collection Time: 04/08/23  9:24 AM  Result Value Ref Range   Hgb A1c MFr Bld 5.4 4.8 - 5.6 %    Comment: (NOTE) Pre diabetes:          5.7%-6.4%  Diabetes:              >6.4%  Glycemic control for   <7.0% adults with diabetes    Mean Plasma Glucose 108.28 mg/dL    Comment: Performed at [facility], [address] [place], [place] [address]  Lipid panel     Status: None   Collection Time: 04/08/23  9:24 AM  Result Value Ref Range   Cholesterol 153 0 - 200 mg/dL   Triglycerides 31 <270 mg/dL   HDL 49 >35  mg/dL   Total CHOL/HDL Ratio 3.1 RATIO   VLDL 6 0 - 40 mg/dL   LDL Cholesterol 98 0 - 99 mg/dL    Comment:        Total Cholesterol/HDL:CHD Risk Coronary Heart Disease Risk Table                     Men   Women  1/2 Average Risk   3.4   3.3  Average Risk       5.0   4.4  2 X Average Risk   9.6   7.1  3 X Average Risk  23.4   11.0        Use the calculated Patient Ratio above and the CHD Risk Table to determine the patient's CHD Risk.        ATP III CLASSIFICATION (LDL):  <100  mg/dL   Optimal  161-096  mg/dL   Near or Above                    Optimal  130-159  mg/dL   Borderline  045-409  mg/dL   High  >811     mg/dL   Very High Performed at [facility], [address] [place], [place] [address]    MR BRAIN WO CONTRAST  Result Date: 04/08/2023 CLINICAL DATA:  Initial evaluation for acute neuro deficit, stroke. EXAM: MRI HEAD WITHOUT CONTRAST TECHNIQUE: Multiplanar, multiecho pulse sequences of the brain and surrounding structures were obtained without intravenous contrast. COMPARISON:  Prior CTs from earlier the same day. FINDINGS: Brain: Generalized age-related cerebral atrophy. Patchy T2/FLAIR hyperintensity involving the periventricular and deep white matter both cerebral hemispheres, consistent with chronic small vessel ischemic disease, moderately advanced in nature. Mild patchy involvement of the pons noted. Few small remote bilateral cerebellar infarcts noted. 9 mm focus of restricted diffusion seen involving the right thalamocapsular region, consistent with a small acute ischemic infarct. No associated hemorrhage or mass effect. No other acute or subacute ischemia. Gray-white matter differentiation otherwise maintained. No acute intracranial hemorrhage. Single chronic microhemorrhage noted at the left parietal lobe. No mass lesion, midline shift or mass effect. No hydrocephalus or extra-axial fluid collection. Pituitary gland and suprasellar region within normal  limits. Vascular: Major intracranial vascular flow voids are maintained. Skull and upper cervical spine: Craniocervical junction within normal limits. Bone marrow signal intensity normal. No scalp soft tissue abnormality. Sinuses/Orbits: Globes and orbital soft tissues within normal limits. Paranasal sinuses are largely clear. No significant mastoid effusion. Other: None. IMPRESSION: 1. 9 mm acute ischemic nonhemorrhagic right thalamocapsular infarct. 2. Underlying age-related cerebral atrophy with moderate chronic microvascular ischemic disease, with a few small remote bilateral cerebellar infarcts. Electronically Signed   By: Rise Mu M.D.   On: 04/08/2023 03:53   DG Chest Port 1 View  Result Date: 04/08/2023 CLINICAL DATA:  Weakness EXAM: PORTABLE CHEST 1 VIEW COMPARISON:  07/22/2020 FINDINGS: Cardiac shadow is within normal limits. Loop recorder is now seen. Lungs are well aerated bilaterally. Diffuse fibrotic changes are noted bilaterally. A somewhat nodular density is noted in the right upper lobe incompletely evaluated on this exam. No sizable effusion is seen. No other focal abnormality is noted. IMPRESSION: Nodular density in the right upper lobe incompletely evaluated on this exam. CT of the chest would be helpful for further evaluation. Chronic fibrotic changes. Electronically Signed   By: Alcide Clever M.D.   On: 04/08/2023 02:10   CT HEAD WO CONTRAST  Result Date: 04/08/2023 CLINICAL DATA:  Initial evaluation for neuro deficit, stroke suspected, left-sided weakness. EXAM: CT ANGIOGRAPHY HEAD AND NECK TECHNIQUE: Multidetector CT imaging of the head and neck was performed using the standard protocol during bolus administration of intravenous contrast. Multiplanar CT image reconstructions and MIPs were obtained to evaluate the vascular anatomy. Carotid stenosis measurements (when applicable) are obtained utilizing NASCET criteria, using the distal internal carotid diameter as the  denominator. RADIATION DOSE REDUCTION: This exam was performed according to the departmental dose-optimization program which includes automated exposure control, adjustment of the mA and/or kV according to patient size and/or use of iterative reconstruction technique. CONTRAST:  75mL OMNIPAQUE IOHEXOL 350 MG/ML SOLN COMPARISON:  Prior study from 01/13/2021. FINDINGS: CT HEAD FINDINGS Brain: Cerebral volume within normal limits. Mild chronic microvascular ischemic disease. Small remote left cerebellar infarct. No acute intracranial hemorrhage. No acute large vessel  territory infarct. No mass lesion or midline shift. No hydrocephalus or extra-axial fluid collection. Vascular: No abnormal hyperdense vessel. Calcified atherosclerosis present at skull base. Skull: Scalp soft tissues within normal limits.  Calvarium intact. Sinuses/Orbits: Globes and orbital soft tissues within normal limits. Paranasal sinuses are largely clear. No mastoid effusion. Other: None. Review of the MIP images confirms the above findings CTA NECK FINDINGS Aortic arch: Visualized aortic arch within normal limits for caliber with standard branch pattern. Aortic atherosclerosis. No significant stenosis about the origin the great vessels. Right carotid system: Right common and internal carotid arteries are patent without dissection or stenosis. Left carotid system: Left common and internal carotid arteries are patent without dissection or stenosis. Vertebral arteries: Both vertebral arteries arise from the subclavian arteries. Atheromatous change at the origin of the right vertebral artery with moderate stenosis. Additional focal moderate stenosis involving the contralateral left V1 segment noted. Vertebral arteries otherwise patent without dissection. Skeleton: No discrete or worrisome osseous lesions. Moderate spondylosis present at C5-6 and C6-7. Other neck: No other acute finding. Upper chest: Multifocal patchy and nodular densities within the  visualized lungs, nonspecific, and could be either infectious or inflammatory in nature. Review of the MIP images confirms the above findings CTA HEAD FINDINGS Anterior circulation: Mild atheromatous change about the carotid siphons without hemodynamically significant stenosis. A1 segments patent bilaterally. Normal anterior communicating artery complex. Anterior cerebral arteries patent without stenosis. No M1 stenosis or occlusion. Distal MCA branches perfused and symmetric. Posterior circulation: Both V4 segments widely patent. Left vertebral artery slightly dominant. Both PICA patent. Basilar patent without stenosis. Superior cerebellar and posterior cerebral arteries patent bilaterally. Venous sinuses: Grossly patent allowing for timing the contrast bolus. Anatomic variants: None significant.  No aneurysm. Review of the MIP images confirms the above findings IMPRESSION: CT HEAD: 1. No acute intracranial abnormality. 2. 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 CTA HEAD AND NECK: 1. Negative CTA for large vessel occlusion or other emergent finding. 2. Moderate bilateral V1 stenoses. 3. Mild atheromatous change about the carotid bifurcations and carotid siphons without hemodynamically significant stenosis. 4. Multifocal patchy and nodular densities within the visualized lungs, nonspecific, and could be either infectious or inflammatory in nature. Findings are incompletely assessed on this exam, and correlation with dedicated chest CT recommended. 5.  Aortic Atherosclerosis (ICD10-I70.0). Electronically Signed   By: Rise Mu M.D.   On: 04/08/2023 01:49   CT ANGIO HEAD NECK W WO CM  Result Date: 04/08/2023 CLINICAL DATA:  Initial evaluation for neuro deficit, stroke suspected, left-sided weakness. EXAM: CT ANGIOGRAPHY HEAD AND NECK TECHNIQUE: Multidetector CT imaging of the head and neck was performed using the standard protocol during bolus  administration of intravenous contrast. Multiplanar CT image reconstructions and MIPs were obtained to evaluate the vascular anatomy. Carotid stenosis measurements (when applicable) are obtained utilizing NASCET criteria, using the distal internal carotid diameter as the denominator. RADIATION DOSE REDUCTION: This exam was performed according to the departmental dose-optimization program which includes automated exposure control, adjustment of the mA and/or kV according to patient size and/or use of iterative reconstruction technique. CONTRAST:  75mL OMNIPAQUE IOHEXOL 350 MG/ML SOLN COMPARISON:  Prior study from 01/13/2021. FINDINGS: CT HEAD FINDINGS Brain: Cerebral volume within normal limits. Mild chronic microvascular ischemic disease. Small remote left cerebellar infarct. No acute intracranial hemorrhage. No acute large vessel territory infarct. No mass lesion or midline shift. No hydrocephalus or extra-axial fluid collection. Vascular: No abnormal hyperdense vessel. Calcified atherosclerosis present at skull  base. Skull: Scalp soft tissues within normal limits.  Calvarium intact. Sinuses/Orbits: Globes and orbital soft tissues within normal limits. Paranasal sinuses are largely clear. No mastoid effusion. Other: None. Review of the MIP images confirms the above findings CTA NECK FINDINGS Aortic arch: Visualized aortic arch within normal limits for caliber with standard branch pattern. Aortic atherosclerosis. No significant stenosis about the origin the great vessels. Right carotid system: Right common and internal carotid arteries are patent without dissection or stenosis. Left carotid system: Left common and internal carotid arteries are patent without dissection or stenosis. Vertebral arteries: Both vertebral arteries arise from the subclavian arteries. Atheromatous change at the origin of the right vertebral artery with moderate stenosis. Additional focal moderate stenosis involving the contralateral left V1  segment noted. Vertebral arteries otherwise patent without dissection. Skeleton: No discrete or worrisome osseous lesions. Moderate spondylosis present at C5-6 and C6-7. Other neck: No other acute finding. Upper chest: Multifocal patchy and nodular densities within the visualized lungs, nonspecific, and could be either infectious or inflammatory in nature. Review of the MIP images confirms the above findings CTA HEAD FINDINGS Anterior circulation: Mild atheromatous change about the carotid siphons without hemodynamically significant stenosis. A1 segments patent bilaterally. Normal anterior communicating artery complex. Anterior cerebral arteries patent without stenosis. No M1 stenosis or occlusion. Distal MCA branches perfused and symmetric. Posterior circulation: Both V4 segments widely patent. Left vertebral artery slightly dominant. Both PICA patent. Basilar patent without stenosis. Superior cerebellar and posterior cerebral arteries patent bilaterally. Venous sinuses: Grossly patent allowing for timing the contrast bolus. Anatomic variants: None significant.  No aneurysm. Review of the MIP images confirms the above findings IMPRESSION: CT HEAD: 1. No acute intracranial abnormality. 2. 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 CTA HEAD AND NECK: 1. Negative CTA for large vessel occlusion or other emergent finding. 2. Moderate bilateral V1 stenoses. 3. Mild atheromatous change about the carotid bifurcations and carotid siphons without hemodynamically significant stenosis. 4. Multifocal patchy and nodular densities within the visualized lungs, nonspecific, and could be either infectious or inflammatory in nature. Findings are incompletely assessed on this exam, and correlation with dedicated chest CT recommended. 5.  Aortic Atherosclerosis (ICD10-I70.0). Electronically Signed   By: Rise Mu M.D.   On: 04/08/2023 01:49    Pending Labs Unresulted Labs (From  admission, onward)    None       Vitals/Pain Today's Vitals   04/08/23 0820 04/08/23 1000 04/08/23 1100 04/08/23 1237  BP:  (!) 142/83 (!) 185/92   Pulse:  (!) 58 60   Resp:  18 (!) 22   Temp: 97.6 F (36.4 C)   97.6 F (36.4 C)  TempSrc: Oral   Oral  SpO2:  96% 96%   Weight:      Height:      PainSc:        Isolation Precautions No active isolations  Medications Medications  aspirin EC tablet 81 mg (81 mg Oral Given 04/08/23 0925)  clopidogrel (PLAVIX) tablet 75 mg (has no administration in time range)  heparin injection 5,000 Units (5,000 Units Subcutaneous Given 04/08/23 0925)  iohexol (OMNIPAQUE) 350 MG/ML injection 75 mL (75 mLs Intravenous Contrast Given 04/08/23 0043)  clopidogrel (PLAVIX) tablet 300 mg (300 mg Oral Given 04/08/23 0646)    Mobility walks with device     Focused Assessments Neuro Assessment Handoff:  Swallow screen pass? Yes  Cardiac Rhythm: Sinus bradycardia NIH Stroke Scale  Dizziness Present: No Headache Present: No Level of  Consciousness (1a.)   : Alert, keenly responsive LOC Questions (1b. )   : Answers both questions correctly LOC Commands (1c. )   : Performs both tasks correctly Best Gaze (2. )  : Normal Visual (3. )  : No visual loss Facial Palsy (4. )    : Normal symmetrical movements Motor Arm, Left (5a. )   : No drift Motor Arm, Right (5b. ) : No drift Motor Leg, Left (6a. )  : No drift Motor Leg, Right (6b. ) : No drift Limb Ataxia (7. ): Absent Sensory (8. )  : Normal, no sensory loss Best Language (9. )  : No aphasia Dysarthria (10. ): Normal Extinction/Inattention (11.)   : No Abnormality Complete NIHSS TOTAL: 0     Neuro Assessment: Within Defined Limits Neuro Checks:      Has TPA been given? No If patient is a Neuro Trauma and patient is going to OR before floor call report to 4N Charge nurse: [phone] or [phone]   R Recommendations: See Admitting Provider Note  Report given to:   Additional  Notes: was able to verbalize needs, family at bedside, ate breakfast, numbness has resolved

## 2023-04-08 NOTE — Progress Notes (Addendum)
STROKE TEAM PROGRESS NOTE   BRIEF HPI  Samuel Mata is a 89 y.o. male, PMHx of HTN, BPH, Nephrolithiasis, and Stroke who presented with left leg weakness for a couple of days.   At that time, he denied visual change, nausea, vomiting, dysarthria, or other symptoms.   LKW: Unclear IV Thrombolysis: Unclear time of onset  EVT: No LVO ICH  NIHSS 2(left facial weakness and left leg drift)  SIGNIFICANT HOSPITAL EVENTS MRI Acute ischemic right thalamocapsular infarct   INTERIM HISTORY/SUBJECTIVE Patient experienced left leg weakness that started Wednesday. Symptoms worsened Thursday at 6 PM and wife noticed. Denies confusion, word finding difficulties or dysarthria. Denies any issues currently. Daughter bedside and feels mental status is gradually worsening at home, not as sharp. Prior stroke was similar and he was discharged with [facility] therapy.   S/P Plavix load > DAPT then Plavix alone  Increased Lipitor to 80 mg  ECHO pending  MRI scan shows small right thalamic capsular lacunar infarct. CT angiogram shows moderate bilateral V1 stenosis. OBJECTIVE  CBC    Component Value Date/Time   WBC 9.0 04/08/2023 0000   RBC 4.70 04/08/2023 0000   HGB 15.0 04/08/2023 0000   HGB 14.5 04/08/2023 0000   HCT 44.0 04/08/2023 0000   HCT 43.8 04/08/2023 0000   PLT 361 04/08/2023 0000   MCV 93.2 04/08/2023 0000   MCH 30.9 04/08/2023 0000   MCHC 33.1 04/08/2023 0000   RDW 12.0 04/08/2023 0000   LYMPHSABS 1.5 04/08/2023 0000   MONOABS 1.5 (H) 04/08/2023 0000   EOSABS 0.3 04/08/2023 0000   BASOSABS 0.1 04/08/2023 0000    BMET    Component Value Date/Time   NA 138 04/08/2023 0000   NA 137 04/08/2023 0000   K 4.3 04/08/2023 0000   K 4.2 04/08/2023 0000   CL 102 04/08/2023 0000   CL 104 04/08/2023 0000   CO2 23 04/08/2023 0000   GLUCOSE 98 04/08/2023 0000   GLUCOSE 99 04/08/2023 0000   BUN 21 04/08/2023 0000   BUN 18 04/08/2023 0000   CREATININE 0.90 04/08/2023 0000   CREATININE 1.04  04/08/2023 0000   CREATININE 1.03 08/22/2020 1443   CALCIUM 9.0 04/08/2023 0000   GFRNONAA >60 04/08/2023 0000    IMAGING past 24 hours MR BRAIN WO CONTRAST  Result Date: 04/08/2023 CLINICAL DATA:  Initial evaluation for acute neuro deficit, stroke. EXAM: MRI HEAD WITHOUT CONTRAST TECHNIQUE: Multiplanar, multiecho pulse sequences of the brain and surrounding structures were obtained without intravenous contrast. COMPARISON:  Prior CTs from earlier the same day. FINDINGS: Brain: Generalized age-related cerebral atrophy. Patchy T2/FLAIR hyperintensity involving the periventricular and deep white matter both cerebral hemispheres, consistent with chronic small vessel ischemic disease, moderately advanced in nature. Mild patchy involvement of the pons noted. Few small remote bilateral cerebellar infarcts noted. 9 mm focus of restricted diffusion seen involving the right thalamocapsular region, consistent with a small acute ischemic infarct. No associated hemorrhage or mass effect. No other acute or subacute ischemia. Gray-white matter differentiation otherwise maintained. No acute intracranial hemorrhage. Single chronic microhemorrhage noted at the left parietal lobe. No mass lesion, midline shift or mass effect. No hydrocephalus or extra-axial fluid collection. Pituitary gland and suprasellar region within normal limits. Vascular: Major intracranial vascular flow voids are maintained. Skull and upper cervical spine: Craniocervical junction within normal limits. Bone marrow signal intensity normal. No scalp soft tissue abnormality. Sinuses/Orbits: Globes and orbital soft tissues within normal limits. Paranasal sinuses are largely clear. No significant mastoid effusion. Other:  None. IMPRESSION: 1. 9 mm acute ischemic nonhemorrhagic right thalamocapsular infarct. 2. Underlying age-related cerebral atrophy with moderate chronic microvascular ischemic disease, with a few small remote bilateral cerebellar infarcts.  Electronically Signed   By: Rise Mu M.D.   On: 04/08/2023 03:53   DG Chest Port 1 View  Result Date: 04/08/2023 CLINICAL DATA:  Weakness EXAM: PORTABLE CHEST 1 VIEW COMPARISON:  07/22/2020 FINDINGS: Cardiac shadow is within normal limits. Loop recorder is now seen. Lungs are well aerated bilaterally. Diffuse fibrotic changes are noted bilaterally. A somewhat nodular density is noted in the right upper lobe incompletely evaluated on this exam. No sizable effusion is seen. No other focal abnormality is noted. IMPRESSION: Nodular density in the right upper lobe incompletely evaluated on this exam. CT of the chest would be helpful for further evaluation. Chronic fibrotic changes. Electronically Signed   By: Alcide Clever M.D.   On: 04/08/2023 02:10   CT HEAD WO CONTRAST  Result Date: 04/08/2023 CLINICAL DATA:  Initial evaluation for neuro deficit, stroke suspected, left-sided weakness. EXAM: CT ANGIOGRAPHY HEAD AND NECK TECHNIQUE: Multidetector CT imaging of the head and neck was performed using the standard protocol during bolus administration of intravenous contrast. Multiplanar CT image reconstructions and MIPs were obtained to evaluate the vascular anatomy. Carotid stenosis measurements (when applicable) are obtained utilizing NASCET criteria, using the distal internal carotid diameter as the denominator. RADIATION DOSE REDUCTION: This exam was performed according to the departmental dose-optimization program which includes automated exposure control, adjustment of the mA and/or kV according to patient size and/or use of iterative reconstruction technique. CONTRAST:  75mL OMNIPAQUE IOHEXOL 350 MG/ML SOLN COMPARISON:  Prior study from 01/13/2021. FINDINGS: CT HEAD FINDINGS Brain: Cerebral volume within normal limits. Mild chronic microvascular ischemic disease. Small remote left cerebellar infarct. No acute intracranial hemorrhage. No acute large vessel territory infarct. No mass lesion or  midline shift. No hydrocephalus or extra-axial fluid collection. Vascular: No abnormal hyperdense vessel. Calcified atherosclerosis present at skull base. Skull: Scalp soft tissues within normal limits.  Calvarium intact. Sinuses/Orbits: Globes and orbital soft tissues within normal limits. Paranasal sinuses are largely clear. No mastoid effusion. Other: None. Review of the MIP images confirms the above findings CTA NECK FINDINGS Aortic arch: Visualized aortic arch within normal limits for caliber with standard branch pattern. Aortic atherosclerosis. No significant stenosis about the origin the great vessels. Right carotid system: Right common and internal carotid arteries are patent without dissection or stenosis. Left carotid system: Left common and internal carotid arteries are patent without dissection or stenosis. Vertebral arteries: Both vertebral arteries arise from the subclavian arteries. Atheromatous change at the origin of the right vertebral artery with moderate stenosis. Additional focal moderate stenosis involving the contralateral left V1 segment noted. Vertebral arteries otherwise patent without dissection. Skeleton: No discrete or worrisome osseous lesions. Moderate spondylosis present at C5-6 and C6-7. Other neck: No other acute finding. Upper chest: Multifocal patchy and nodular densities within the visualized lungs, nonspecific, and could be either infectious or inflammatory in nature. Review of the MIP images confirms the above findings CTA HEAD FINDINGS Anterior circulation: Mild atheromatous change about the carotid siphons without hemodynamically significant stenosis. A1 segments patent bilaterally. Normal anterior communicating artery complex. Anterior cerebral arteries patent without stenosis. No M1 stenosis or occlusion. Distal MCA branches perfused and symmetric. Posterior circulation: Both V4 segments widely patent. Left vertebral artery slightly dominant. Both PICA patent. Basilar  patent without stenosis. Superior cerebellar and posterior cerebral arteries patent bilaterally. Venous sinuses: Grossly  patent allowing for timing the contrast bolus. Anatomic variants: None significant.  No aneurysm. Review of the MIP images confirms the above findings IMPRESSION: CT HEAD: 1. No acute intracranial abnormality. 2. 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 CTA HEAD AND NECK: 1. Negative CTA for large vessel occlusion or other emergent finding. 2. Moderate bilateral V1 stenoses. 3. Mild atheromatous change about the carotid bifurcations and carotid siphons without hemodynamically significant stenosis. 4. Multifocal patchy and nodular densities within the visualized lungs, nonspecific, and could be either infectious or inflammatory in nature. Findings are incompletely assessed on this exam, and correlation with dedicated chest CT recommended. 5.  Aortic Atherosclerosis (ICD10-I70.0). Electronically Signed   By: Rise Mu M.D.   On: 04/08/2023 01:49   CT ANGIO HEAD NECK W WO CM  Result Date: 04/08/2023 CLINICAL DATA:  Initial evaluation for neuro deficit, stroke suspected, left-sided weakness. EXAM: CT ANGIOGRAPHY HEAD AND NECK TECHNIQUE: Multidetector CT imaging of the head and neck was performed using the standard protocol during bolus administration of intravenous contrast. Multiplanar CT image reconstructions and MIPs were obtained to evaluate the vascular anatomy. Carotid stenosis measurements (when applicable) are obtained utilizing NASCET criteria, using the distal internal carotid diameter as the denominator. RADIATION DOSE REDUCTION: This exam was performed according to the departmental dose-optimization program which includes automated exposure control, adjustment of the mA and/or kV according to patient size and/or use of iterative reconstruction technique. CONTRAST:  75mL OMNIPAQUE IOHEXOL 350 MG/ML SOLN COMPARISON:  Prior study  from 01/13/2021. FINDINGS: CT HEAD FINDINGS Brain: Cerebral volume within normal limits. Mild chronic microvascular ischemic disease. Small remote left cerebellar infarct. No acute intracranial hemorrhage. No acute large vessel territory infarct. No mass lesion or midline shift. No hydrocephalus or extra-axial fluid collection. Vascular: No abnormal hyperdense vessel. Calcified atherosclerosis present at skull base. Skull: Scalp soft tissues within normal limits.  Calvarium intact. Sinuses/Orbits: Globes and orbital soft tissues within normal limits. Paranasal sinuses are largely clear. No mastoid effusion. Other: None. Review of the MIP images confirms the above findings CTA NECK FINDINGS Aortic arch: Visualized aortic arch within normal limits for caliber with standard branch pattern. Aortic atherosclerosis. No significant stenosis about the origin the great vessels. Right carotid system: Right common and internal carotid arteries are patent without dissection or stenosis. Left carotid system: Left common and internal carotid arteries are patent without dissection or stenosis. Vertebral arteries: Both vertebral arteries arise from the subclavian arteries. Atheromatous change at the origin of the right vertebral artery with moderate stenosis. Additional focal moderate stenosis involving the contralateral left V1 segment noted. Vertebral arteries otherwise patent without dissection. Skeleton: No discrete or worrisome osseous lesions. Moderate spondylosis present at C5-6 and C6-7. Other neck: No other acute finding. Upper chest: Multifocal patchy and nodular densities within the visualized lungs, nonspecific, and could be either infectious or inflammatory in nature. Review of the MIP images confirms the above findings CTA HEAD FINDINGS Anterior circulation: Mild atheromatous change about the carotid siphons without hemodynamically significant stenosis. A1 segments patent bilaterally. Normal anterior communicating  artery complex. Anterior cerebral arteries patent without stenosis. No M1 stenosis or occlusion. Distal MCA branches perfused and symmetric. Posterior circulation: Both V4 segments widely patent. Left vertebral artery slightly dominant. Both PICA patent. Basilar patent without stenosis. Superior cerebellar and posterior cerebral arteries patent bilaterally. Venous sinuses: Grossly patent allowing for timing the contrast bolus. Anatomic variants: None significant.  No aneurysm. Review of the MIP images confirms the above findings IMPRESSION:  CT HEAD: 1. No acute intracranial abnormality. 2. 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 CTA HEAD AND NECK: 1. Negative CTA for large vessel occlusion or other emergent finding. 2. Moderate bilateral V1 stenoses. 3. Mild atheromatous change about the carotid bifurcations and carotid siphons without hemodynamically significant stenosis. 4. Multifocal patchy and nodular densities within the visualized lungs, nonspecific, and could be either infectious or inflammatory in nature. Findings are incompletely assessed on this exam, and correlation with dedicated chest CT recommended. 5.  Aortic Atherosclerosis (ICD10-I70.0). Electronically Signed   By: Rise Mu M.D.   On: 04/08/2023 01:49    Vitals:   04/08/23 0820 04/08/23 1000 04/08/23 1100 04/08/23 1237  BP:  (!) 142/83 (!) 185/92   Pulse:  (!) 58 60   Resp:  18 (!) 22   Temp: 97.6 F (36.4 C)   97.6 F (36.4 C)  TempSrc: Oral   Oral  SpO2:  96% 96%   Weight:      Height:        PHYSICAL EXAM General:  Alert, atient is elderly, thin, caucasian male able to give, patient in no acute distress Psych:  Mood and affect appropriate for situation CV: Regular rate and rhythm on monitor Respiratory:  Regular, unlabored respirations on room air GI: Abdomen soft and nontender   NEURO:  Mental Status: AA&Ox3, patient is elderly, thin, caucasian male able to give  clear and coherent history. Daughter bedside.  Speech/Language: speech is without dysarthria or aphasia. Comprehension intact.  Cranial Nerves:  II: PERRL. Visual fields full.  III, IV, VI: EOMI. Eyelids elevate symmetrically.  V: Sensation is intact to light touch and symmetrical to face.  VII: Face is symmetrical resting and smiling VIII: hearing intact to voice and mildly impacted without hearing aid. IX, X: Palate elevates symmetrically. Phonation is normal.  XII: tongue is midline without fasciculations. Motor: 4/5 strength in LLE extremity with drift diminished fine finger movements on the left.  Orbits right over left upper extremity. Tone: is normal and bulk is normal Sensation- Intact to light touch bilaterally. Extinction absent to light touch to DSS.   Coordination: FTN intact bilaterally. LLE drift +1.   Gait- deferred  Most Recent NIH 2    ASSESSMENT/PLAN  Acute ischemic right thalamocapsular infarct   Etiology: microvascular ischemic disease 2/2 to chronic risk factors of  HTN, HLD and  tobacco use  Code Stroke. No acute intracranial abnormality. Mild chronic microvascular ischemic disease with small remote cerebellar infarct.    CTA head & neck No LVO. Moderate bilateral V1 stenosis.  MRI 9 mm acute ischemic nonhemorrhagic right thalamocapsular infarct, age related cerebral atrophy w/ moderate chronic microvascular ischemic disease 2D Echo pending  LDL 98 HgbA1c 5.4 UDS negative  VTE prophylaxis - Heparin aspirin 81 mg daily prior to admission, now on aspirin 81 mg daily and clopidogrel 75 mg daily for 3 weeks and then Plavix alone. Therapy recommendations:  Pending Disposition:  pending  Hx of Stroke/TIA Acute punctate infarct at left frontoparietal perirolandic cortex s/p TNK in 12/2020 Loop recorder in place, negative for afib during prior interrogations  Hypertension Home meds:  Metoprolol succinate 25 mg daily  Continue holding home meds  BP within goal,  allow permissive HTN for 24 hours  Blood Pressure Goal: BP less than 220/110   Hyperlipidemia Home meds:  Lipitor 40 mg daily  LDL 98, goal < 70 Increased to 80 mg daily  Continue statin at discharge  Diabetes type  II Controlled Home meds: None  HgbA1c 5.4, goal < 7.0 Recommend close follow-up with PCP for better DM control  Former Tobacco Abuse Patient quit smoking in 1986, 30 year pack history   Dysphagia Patient has post-stroke dysphagia, SLP consulted    Diet   Diet Heart Room service appropriate? Yes; Fluid consistency: Thin   Advance diet as tolerated  Other Stroke Risk Factors ETOH use, alcohol level <10, advised to drink no more than 2 drink(s) a day Obesity, Body mass index is 20.36 kg/m., BMI >/= 30 associated with increased stroke risk, recommend weight loss, diet and exercise as appropriate   Other Active Problems BPH holding home flomax  Chest pain, evaluated by cards outpatient on 11/15, home meds: metoprolol and nitroglycerin prn,   Hospital day # 0  I have personally obtained history,examined this patient, reviewed notes, independently viewed imaging studies, participated in medical decision making and plan of care.ROS completed by me personally and pertinent positives fully documented  I have made any additions or clarifications directly to the above note. Agree with note above.  Patient presented with left leg weakness for a few days and MRI shows right thalamic capsular lacunar infarct from small vessel disease.  Recommend aspirin Plavix for 3 weeks followed by Plavix alone and aggressive risk factor modification.  Physical Occupational Therapy consults.  Check echocardiogram results.  Greater than 50% time during this 50-minute visit was spent in counseling and coordination of care about his lacunar stroke discussion about evaluation and treatment and answering questions.  Delia Heady, MD Medical Director [facility] Pager:  [phone] 04/08/2023 4:31 PM   To contact Stroke Continuity provider, please refer to [email or web address]. After hours, contact General Neurology

## 2023-04-08 NOTE — ED Notes (Signed)
ED TO INPATIENT HANDOFF REPORT  ED Nurse Name and Phone #: Trinna Post, RN [phone]  S Name/Age/Gender Samuel Mata 89 y.o. male Room/Bed: 018C/018C  Code Status   Code Status: Prior  Home/SNF/Other Home Patient oriented to: self, place, and situation Is this baseline? Yes   Triage Complete: Triage complete  Chief Complaint numbness  Triage Note Patient BIB [facility] from home due to L leg numbness. Upon arrival to room patient states numbness has improved. Patient denies pain to such leg. Hx of stroke with no deficits. Patient not on thinners. Patient is A&Ox4.   Allergies Allergies  Allergen Reactions   Codeine     unknown    Level of Care/Admitting Diagnosis ED Disposition     ED Disposition  Admit   Condition  --   Comment  The patient appears reasonably stabilized for admission considering the current resources, flow, and capabilities available in the ED at this time, and I doubt any other [facility] requiring further screening and/or treatment in the ED prior to admission is  present.          B Medical/Surgery History Past Medical History:  Diagnosis Date   Enlarged prostate    Hypertension    Renal stones    Stroke [facility])    Past Surgical History:  Procedure Laterality Date   APPENDECTOMY     CHOLECYSTECTOMY     LOOP RECORDER INSERTION N/A 01/15/2021   Procedure: LOOP RECORDER INSERTION;  Surgeon: Hillis Range, MD;  Location: [facility] INVASIVE [facility] LAB;  Service: Cardiovascular;  Laterality: N/A;   NOSE SURGERY     SPINE SURGERY     compressoin fx T12, kyphoplasty   SPLENECTOMY, TOTAL       A IV Location/Drains/Wounds Patient Lines/Drains/Airways Status     Active Line/Drains/Airways     Name Placement date Placement time Site Days   Peripheral IV 04/07/23 20 G Anterior;Left;Proximal Forearm 04/07/23  2339  Forearm  1            Intake/Output Last 24 hours No intake or output data in the 24 hours ending 04/08/23 0404  Labs/Imaging Results for orders  placed or performed during the hospital encounter of 04/07/23 (from the past 48 hour(s))  I-stat chem 8, ED     Status: None   Collection Time: 04/08/23 12:00 AM  Result Value Ref Range   Sodium 138 135 - 145 mmol/L   Potassium 4.3 3.5 - 5.1 mmol/L   Chloride 102 98 - 111 mmol/L   BUN 21 8 - 23 mg/dL   Creatinine, Ser 6.96 0.61 - 1.24 mg/dL   Glucose, Bld 98 70 - 99 mg/dL    Comment: Glucose reference range applies only to samples taken after fasting for at least 8 hours.   Calcium, Ion 1.17 1.15 - 1.40 mmol/L   TCO2 27 22 - 32 mmol/L   Hemoglobin 15.0 13.0 - 17.0 g/dL   HCT 29.5 28.4 - 13.2 %  Ethanol     Status: None   Collection Time: 04/08/23 12:00 AM  Result Value Ref Range   Alcohol, Ethyl (B) <10 <10 mg/dL    Comment: (NOTE) Lowest detectable limit for serum alcohol is 10 mg/dL.  For medical purposes only. Performed at [facility], [address], [place], [place] [address]   Protime-INR     Status: None   Collection Time: 04/08/23 12:00 AM  Result Value Ref Range   Prothrombin Time 14.8 11.4 - 15.2 seconds   INR 1.1 0.8 -  1.2    Comment: (NOTE) INR goal varies based on device and disease states. Performed at [facility], [address], [place], [place] [address]   APTT     Status: None   Collection Time: 04/08/23 12:00 AM  Result Value Ref Range   aPTT 30 24 - 36 seconds    Comment: Performed at [facility], [address], [place], [place] [address]  CBC     Status: None   Collection Time: 04/08/23 12:00 AM  Result Value Ref Range   WBC 9.0 4.0 - 10.5 K/uL   RBC 4.70 4.22 - 5.81 MIL/uL   Hemoglobin 14.5 13.0 - 17.0 g/dL   HCT 09.8 11.9 - 14.7 %   MCV 93.2 80.0 - 100.0 fL   MCH 30.9 26.0 - 34.0 pg   MCHC 33.1 30.0 - 36.0 g/dL   RDW 82.9 56.2 - 13.0 %   Platelets 361 150 - 400 K/uL   nRBC 0.0 0.0 - 0.2 %    Comment: Performed at [facility], [address], [place], [place] [address]  Differential     Status: Abnormal    Collection Time: 04/08/23 12:00 AM  Result Value Ref Range   Neutrophils Relative % 61 %   Neutro Abs 5.6 1.7 - 7.7 K/uL   Lymphocytes Relative 17 %   Lymphs Abs 1.5 0.7 - 4.0 K/uL   Monocytes Relative 17 %   Monocytes Absolute 1.5 (H) 0.1 - 1.0 K/uL   Eosinophils Relative 4 %   Eosinophils Absolute 0.3 0.0 - 0.5 K/uL   Basophils Relative 1 %   Basophils Absolute 0.1 0.0 - 0.1 K/uL   Immature Granulocytes 0 %   Abs Immature Granulocytes 0.02 0.00 - 0.07 K/uL    Comment: Performed at [facility], [address], [place], [place] [address]  Comprehensive metabolic panel     Status: Abnormal   Collection Time: 04/08/23 12:00 AM  Result Value Ref Range   Sodium 137 135 - 145 mmol/L   Potassium 4.2 3.5 - 5.1 mmol/L   Chloride 104 98 - 111 mmol/L   CO2 23 22 - 32 mmol/L   Glucose, Bld 99 70 - 99 mg/dL    Comment: Glucose reference range applies only to samples taken after fasting for at least 8 hours.   BUN 18 8 - 23 mg/dL   Creatinine, Ser 9.52 0.61 - 1.24 mg/dL   Calcium 9.0 8.9 - 84.1 mg/dL   Total Protein 6.7 6.5 - 8.1 g/dL   Albumin 3.0 (L) 3.5 - 5.0 g/dL   AST 21 15 - 41 U/L   ALT 11 0 - 44 U/L   Alkaline Phosphatase 60 38 - 126 U/L   Total Bilirubin 0.6 <1.2 mg/dL   GFR, Estimated >32 >44 mL/min    Comment: (NOTE) Calculated using the CKD-EPI Creatinine Equation (2021)    Anion gap 10 5 - 15    Comment: Performed at [facility], [address], [place], [place] [address]  Urine rapid drug screen (hosp performed)     Status: None   Collection Time: 04/08/23 12:00 AM  Result Value Ref Range   Opiates NONE DETECTED NONE DETECTED   Cocaine NONE DETECTED NONE DETECTED   Benzodiazepines NONE DETECTED NONE DETECTED   Amphetamines NONE DETECTED NONE DETECTED   Tetrahydrocannabinol NONE DETECTED NONE DETECTED   Barbiturates NONE DETECTED NONE DETECTED    Comment: (NOTE) DRUG SCREEN FOR MEDICAL  PURPOSES ONLY.  IF CONFIRMATION IS NEEDED FOR ANY PURPOSE, NOTIFY  LAB WITHIN 5 DAYS.  LOWEST DETECTABLE LIMITS FOR URINE DRUG SCREEN Drug Class                     Cutoff (ng/mL) Amphetamine and metabolites    1000 Barbiturate and metabolites    200 Benzodiazepine                 200 Opiates and metabolites        300 Cocaine and metabolites        300 THC                            50 Performed at [facility], [address] [place], [place] [address]   Urinalysis, Routine w reflex microscopic -Urine, Clean Catch     Status: None   Collection Time: 04/08/23 12:00 AM  Result Value Ref Range   Color, Urine YELLOW YELLOW   APPearance CLEAR CLEAR   Specific Gravity, Urine 1.010 1.005 - 1.030   pH 7.0 5.0 - 8.0   Glucose, UA NEGATIVE NEGATIVE mg/dL   Hgb urine dipstick NEGATIVE NEGATIVE   Bilirubin Urine NEGATIVE NEGATIVE   Ketones, ur NEGATIVE NEGATIVE mg/dL   Protein, ur NEGATIVE NEGATIVE mg/dL   Nitrite NEGATIVE NEGATIVE   Leukocytes,Ua NEGATIVE NEGATIVE    Comment: Performed at [facility], [address], [place], [place] [address]   MR BRAIN WO CONTRAST  Result Date: 04/08/2023 CLINICAL DATA:  Initial evaluation for acute neuro deficit, stroke. EXAM: MRI HEAD WITHOUT CONTRAST TECHNIQUE: Multiplanar, multiecho pulse sequences of the brain and surrounding structures were obtained without intravenous contrast. COMPARISON:  Prior CTs from earlier the same day. FINDINGS: Brain: Generalized age-related cerebral atrophy. Patchy T2/FLAIR hyperintensity involving the periventricular and deep white matter both cerebral hemispheres, consistent with chronic small vessel ischemic disease, moderately advanced in nature. Mild patchy involvement of the pons noted. Few small remote bilateral cerebellar infarcts noted. 9 mm focus of restricted diffusion seen involving the right thalamocapsular region, consistent with a small acute ischemic infarct. No associated hemorrhage or mass effect. No other acute or subacute ischemia. Gray-white matter  differentiation otherwise maintained. No acute intracranial hemorrhage. Single chronic microhemorrhage noted at the left parietal lobe. No mass lesion, midline shift or mass effect. No hydrocephalus or extra-axial fluid collection. Pituitary gland and suprasellar region within normal limits. Vascular: Major intracranial vascular flow voids are maintained. Skull and upper cervical spine: Craniocervical junction within normal limits. Bone marrow signal intensity normal. No scalp soft tissue abnormality. Sinuses/Orbits: Globes and orbital soft tissues within normal limits. Paranasal sinuses are largely clear. No significant mastoid effusion. Other: None. IMPRESSION: 1. 9 mm acute ischemic nonhemorrhagic right thalamocapsular infarct. 2. Underlying age-related cerebral atrophy with moderate chronic microvascular ischemic disease, with a few small remote bilateral cerebellar infarcts. Electronically Signed   By: Rise Mu M.D.   On: 04/08/2023 03:53   DG Chest Port 1 View  Result Date: 04/08/2023 CLINICAL DATA:  Weakness EXAM: PORTABLE CHEST 1 VIEW COMPARISON:  07/22/2020 FINDINGS: Cardiac shadow is within normal limits. Loop recorder is now seen. Lungs are well aerated bilaterally. Diffuse fibrotic changes are noted bilaterally. A somewhat nodular density is noted in the right upper lobe incompletely evaluated on this exam. No sizable effusion is seen. No other focal abnormality is noted. IMPRESSION: Nodular density in the right upper lobe incompletely evaluated  on this exam. CT of the chest would be helpful for further evaluation. Chronic fibrotic changes. Electronically Signed   By: Alcide Clever M.D.   On: 04/08/2023 02:10   CT HEAD WO CONTRAST  Result Date: 04/08/2023 CLINICAL DATA:  Initial evaluation for neuro deficit, stroke suspected, left-sided weakness. EXAM: CT ANGIOGRAPHY HEAD AND NECK TECHNIQUE: Multidetector CT imaging of the head and neck was performed using the standard protocol  during bolus administration of intravenous contrast. Multiplanar CT image reconstructions and MIPs were obtained to evaluate the vascular anatomy. Carotid stenosis measurements (when applicable) are obtained utilizing NASCET criteria, using the distal internal carotid diameter as the denominator. RADIATION DOSE REDUCTION: This exam was performed according to the departmental dose-optimization program which includes automated exposure control, adjustment of the mA and/or kV according to patient size and/or use of iterative reconstruction technique. CONTRAST:  75mL OMNIPAQUE IOHEXOL 350 MG/ML SOLN COMPARISON:  Prior study from 01/13/2021. FINDINGS: CT HEAD FINDINGS Brain: Cerebral volume within normal limits. Mild chronic microvascular ischemic disease. Small remote left cerebellar infarct. No acute intracranial hemorrhage. No acute large vessel territory infarct. No mass lesion or midline shift. No hydrocephalus or extra-axial fluid collection. Vascular: No abnormal hyperdense vessel. Calcified atherosclerosis present at skull base. Skull: Scalp soft tissues within normal limits.  Calvarium intact. Sinuses/Orbits: Globes and orbital soft tissues within normal limits. Paranasal sinuses are largely clear. No mastoid effusion. Other: None. Review of the MIP images confirms the above findings CTA NECK FINDINGS Aortic arch: Visualized aortic arch within normal limits for caliber with standard branch pattern. Aortic atherosclerosis. No significant stenosis about the origin the great vessels. Right carotid system: Right common and internal carotid arteries are patent without dissection or stenosis. Left carotid system: Left common and internal carotid arteries are patent without dissection or stenosis. Vertebral arteries: Both vertebral arteries arise from the subclavian arteries. Atheromatous change at the origin of the right vertebral artery with moderate stenosis. Additional focal moderate stenosis involving the  contralateral left V1 segment noted. Vertebral arteries otherwise patent without dissection. Skeleton: No discrete or worrisome osseous lesions. Moderate spondylosis present at C5-6 and C6-7. Other neck: No other acute finding. Upper chest: Multifocal patchy and nodular densities within the visualized lungs, nonspecific, and could be either infectious or inflammatory in nature. Review of the MIP images confirms the above findings CTA HEAD FINDINGS Anterior circulation: Mild atheromatous change about the carotid siphons without hemodynamically significant stenosis. A1 segments patent bilaterally. Normal anterior communicating artery complex. Anterior cerebral arteries patent without stenosis. No M1 stenosis or occlusion. Distal MCA branches perfused and symmetric. Posterior circulation: Both V4 segments widely patent. Left vertebral artery slightly dominant. Both PICA patent. Basilar patent without stenosis. Superior cerebellar and posterior cerebral arteries patent bilaterally. Venous sinuses: Grossly patent allowing for timing the contrast bolus. Anatomic variants: None significant.  No aneurysm. Review of the MIP images confirms the above findings IMPRESSION: CT HEAD: 1. No acute intracranial abnormality. 2. 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 CTA HEAD AND NECK: 1. Negative CTA for large vessel occlusion or other emergent finding. 2. Moderate bilateral V1 stenoses. 3. Mild atheromatous change about the carotid bifurcations and carotid siphons without hemodynamically significant stenosis. 4. Multifocal patchy and nodular densities within the visualized lungs, nonspecific, and could be either infectious or inflammatory in nature. Findings are incompletely assessed on this exam, and correlation with dedicated chest CT recommended. 5.  Aortic Atherosclerosis (ICD10-I70.0). Electronically Signed   By: Janell Quiet.D.  On: 04/08/2023 01:49   CT ANGIO HEAD  NECK W WO CM  Result Date: 04/08/2023 CLINICAL DATA:  Initial evaluation for neuro deficit, stroke suspected, left-sided weakness. EXAM: CT ANGIOGRAPHY HEAD AND NECK TECHNIQUE: Multidetector CT imaging of the head and neck was performed using the standard protocol during bolus administration of intravenous contrast. Multiplanar CT image reconstructions and MIPs were obtained to evaluate the vascular anatomy. Carotid stenosis measurements (when applicable) are obtained utilizing NASCET criteria, using the distal internal carotid diameter as the denominator. RADIATION DOSE REDUCTION: This exam was performed according to the departmental dose-optimization program which includes automated exposure control, adjustment of the mA and/or kV according to patient size and/or use of iterative reconstruction technique. CONTRAST:  75mL OMNIPAQUE IOHEXOL 350 MG/ML SOLN COMPARISON:  Prior study from 01/13/2021. FINDINGS: CT HEAD FINDINGS Brain: Cerebral volume within normal limits. Mild chronic microvascular ischemic disease. Small remote left cerebellar infarct. No acute intracranial hemorrhage. No acute large vessel territory infarct. No mass lesion or midline shift. No hydrocephalus or extra-axial fluid collection. Vascular: No abnormal hyperdense vessel. Calcified atherosclerosis present at skull base. Skull: Scalp soft tissues within normal limits.  Calvarium intact. Sinuses/Orbits: Globes and orbital soft tissues within normal limits. Paranasal sinuses are largely clear. No mastoid effusion. Other: None. Review of the MIP images confirms the above findings CTA NECK FINDINGS Aortic arch: Visualized aortic arch within normal limits for caliber with standard branch pattern. Aortic atherosclerosis. No significant stenosis about the origin the great vessels. Right carotid system: Right common and internal carotid arteries are patent without dissection or stenosis. Left carotid system: Left common and internal carotid arteries  are patent without dissection or stenosis. Vertebral arteries: Both vertebral arteries arise from the subclavian arteries. Atheromatous change at the origin of the right vertebral artery with moderate stenosis. Additional focal moderate stenosis involving the contralateral left V1 segment noted. Vertebral arteries otherwise patent without dissection. Skeleton: No discrete or worrisome osseous lesions. Moderate spondylosis present at C5-6 and C6-7. Other neck: No other acute finding. Upper chest: Multifocal patchy and nodular densities within the visualized lungs, nonspecific, and could be either infectious or inflammatory in nature. Review of the MIP images confirms the above findings CTA HEAD FINDINGS Anterior circulation: Mild atheromatous change about the carotid siphons without hemodynamically significant stenosis. A1 segments patent bilaterally. Normal anterior communicating artery complex. Anterior cerebral arteries patent without stenosis. No M1 stenosis or occlusion. Distal MCA branches perfused and symmetric. Posterior circulation: Both V4 segments widely patent. Left vertebral artery slightly dominant. Both PICA patent. Basilar patent without stenosis. Superior cerebellar and posterior cerebral arteries patent bilaterally. Venous sinuses: Grossly patent allowing for timing the contrast bolus. Anatomic variants: None significant.  No aneurysm. Review of the MIP images confirms the above findings IMPRESSION: CT HEAD: 1. No acute intracranial abnormality. 2. 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 CTA HEAD AND NECK: 1. Negative CTA for large vessel occlusion or other emergent finding. 2. Moderate bilateral V1 stenoses. 3. Mild atheromatous change about the carotid bifurcations and carotid siphons without hemodynamically significant stenosis. 4. Multifocal patchy and nodular densities within the visualized lungs, nonspecific, and could be either infectious or  inflammatory in nature. Findings are incompletely assessed on this exam, and correlation with dedicated chest CT recommended. 5.  Aortic Atherosclerosis (ICD10-I70.0). Electronically Signed   By: Rise Mu M.D.   On: 04/08/2023 01:49    Pending Labs Unresulted Labs (From admission, onward)    None  Vitals/Pain Today's Vitals   04/07/23 2329 04/08/23 0100 04/08/23 0200 04/08/23 0350  BP: (!) 180/94 (!) 164/86 (!) 173/92   Pulse: 68 62 65   Resp: (!) 23 18 17    Temp: 98.2 F (36.8 C)   98.5 F (36.9 C)  TempSrc: Oral   Oral  SpO2: 94% 94% 93%   Weight: 59 kg     Height: 5\' 7"  (1.702 m)     PainSc: 0-No pain       Isolation Precautions No active isolations  Medications Medications  iohexol (OMNIPAQUE) 350 MG/ML injection 75 mL (75 mLs Intravenous Contrast Given 04/08/23 0043)    Mobility walks with device     Focused Assessments Neuro-Confused at times    R Recommendations: See Admitting Provider Note  Report given to:   Additional Notes:

## 2023-04-08 NOTE — Progress Notes (Signed)
PT Cancellation Note  Patient Details Name: Samuel Mata MRN: [id number] DOB: 05-14-1931   Cancelled Treatment:    Reason Eval/Treat Not Completed: Other (comment). Pt working with OT upon PT arrival. PT will follow up as time allows.   Arlyss Gandy 04/08/2023, 5:30 PM

## 2023-04-08 NOTE — Evaluation (Signed)
Occupational Therapy Evaluation Patient Details Name: Samuel Mata MRN: [id number] DOB: [birth year] Today's Date: 04/08/2023   History of Present Illness 89 y/o male presented to ED on 04/07/23 with left leg weakness,word finding difficulties, and dysarthria. MRI confirmed acute CVA, right thalamocapsular infarct. PMH: HTN, remote cerebellar stroke, renal stones, kyphoplasty   Clinical Impression   Pt admitted for above, his vision was intact overall with some mild dysmetria of LUE but he does wear glasses at baseline (which his daughter brought at end of session). Pt LUE a bit weak compared to R but functional, his LLE was ataxic once standing, noticed to require far more effort to move it and pt leaning to the L with his LLE sliding inward causing him to continuously lose his BOS when standing requiring support from OT to maintain standing balance. Pt would benefit from continued acute skilled OT services to address deficits and help transition to next level of care. Patient has the potential to reach Mod I and demos the ability to tolerate 3 hours of therapy. Pt would benefit from an intensive rehab program to help maximize functional independence.       If plan is discharge home, recommend the following: A lot of help with walking and/or transfers;A little help with bathing/dressing/bathroom;Assistance with cooking/housework    Functional Status Assessment  Patient has had a recent decline in their functional status and demonstrates the ability to make significant improvements in function in a reasonable and predictable amount of time.  Equipment Recommendations  Other (comment) (RW)    Recommendations for Other Services Rehab consult     Precautions / Restrictions Precautions Precautions: Fall Restrictions Weight Bearing Restrictions: No      Mobility Bed Mobility               General bed mobility comments: up in recliner on arrival    Transfers Overall transfer  level: Needs assistance Equipment used: Rolling walker (2 wheels) Transfers: Sit to/from Stand Sit to Stand: Mod assist           General transfer comment: cues for hand placement, took steps forward and back at bedside. Mod A with L lean needing cues to reposition to midline and increased effort needed to coordinate LLE.      Balance Overall balance assessment: Needs assistance Sitting-balance support: No upper extremity supported, Feet supported Sitting balance-Leahy Scale: Fair Sitting balance - Comments: up in recliner   Standing balance support: Reliant on assistive device for balance, Bilateral upper extremity supported Standing balance-Leahy Scale: Poor Standing balance comment: RW + Mod A                           ADL either performed or assessed with clinical judgement   ADL Overall ADL's : Needs assistance/impaired Eating/Feeding: Independent;Sitting   Grooming: Set up;Sitting;Wash/dry face   Upper Body Bathing: Sitting;Set up   Lower Body Bathing: Sitting/lateral leans;Contact guard assist   Upper Body Dressing : Sitting;Set up   Lower Body Dressing: Sitting/lateral leans;Supervision/safety;Set up Lower Body Dressing Details (indicate cue type and reason): donned/doffed socks, can achieve figure four but needed light assist to position LLE into figure four to don socks Toilet Transfer: Moderate assistance;Stand-pivot Toilet Transfer Details (indicate cue type and reason): potential need for Max A with fatigue Toileting- Clothing Manipulation and Hygiene: Maximal assistance;Sit to/from stand               Vision Patient Visual Report: No change from baseline  Vision Assessment?: Yes Eye Alignment: Within Functional Limits Ocular Range of Motion: Within Functional Limits Alignment/Gaze Preference: Within Defined Limits Tracking/Visual Pursuits: Able to track stimulus in all quads without difficulty Convergence: Within functional limits Visual  Fields: No apparent deficits Additional Comments: some dysmetria with LUE and R eye closed     Perception         Praxis         Pertinent Vitals/Pain Pain Assessment Pain Assessment: No/denies pain     Extremity/Trunk Assessment Upper Extremity Assessment Upper Extremity Assessment: Overall WFL for tasks assessed (L weaker than R, L shoulder 3+/5MMT while elbow flexors 4/5)   Lower Extremity Assessment Lower Extremity Assessment: Generalized weakness   Cervical / Trunk Assessment Cervical / Trunk Assessment: Kyphotic   Communication Communication Communication: Hearing impairment Cueing Techniques: Verbal cues;Gestural cues;Tactile cues   Cognition Arousal: Alert Behavior During Therapy: WFL for tasks assessed/performed Overall Cognitive Status: Within Functional Limits for tasks assessed                                 General Comments: Overall WFL, HOH so uncertain if truly slow processing     General Comments  VSS per montior.    Exercises General Exercises - Lower Extremity Hip Flexion/Marching: AROM, Standing, 5 reps, Both   Shoulder Instructions      Home Living Family/patient expects to be discharged to:: Private residence Living Arrangements: Spouse/significant other Available Help at Discharge: Family;Available 24 hours/day (not able to physically help) Type of Home: House Home Access: Stairs to enter [employer] of Steps: 2 Entrance Stairs-Rails: None Home Layout: One level     Bathroom Shower/Tub: Walk-in shower (half step)   Firefighter: Standard     Home Equipment: Production assistant, radio - single point;BSC/3in1;Grab bars - tub/shower (BSC in storage)   Additional Comments: wife walks with a RW, he reports his wife is using his RW. pt does more sponge baths than a shower      Prior Functioning/Environment Prior Level of Function : Independent/Modified Independent             Mobility Comments: ind with spc  since last CVA years ago ADLs Comments: Ind in ADLs, wife completes iADls        OT Problem List: Impaired balance (sitting and/or standing);Decreased strength;Decreased coordination      OT Treatment/Interventions: Self-care/ADL training;Balance training;Therapeutic exercise;Therapeutic activities;Patient/family education    OT Goals(Current goals can be found in the care plan section) Acute Rehab OT Goals Patient Stated Goal: To get back to being independent OT Goal Formulation: With family Time For Goal Achievement: 04/22/23 Potential to Achieve Goals: Good ADL Goals Pt Will Perform Grooming: with modified independence;standing Pt Will Perform Lower Body Bathing: with modified independence;sitting/lateral leans Pt Will Perform Lower Body Dressing: with modified independence;sit to/from stand Pt Will Transfer to Toilet: with modified independence;ambulating Pt Will Perform Toileting - Clothing Manipulation and hygiene: with modified independence;sit to/from stand  OT Frequency: Min 1X/week    Co-evaluation              AM-PAC OT "6 Clicks" Daily Activity     Outcome Measure Help from another person eating meals?: None Help from another person taking care of personal grooming?: A Little Help from another person toileting, which includes using toliet, bedpan, or urinal?: A Lot Help from another person bathing (including washing, rinsing, drying)?: A Little Help from another person to put on and  taking off regular upper body clothing?: A Little Help from another person to put on and taking off regular lower body clothing?: A Little 6 Click Score: 18   End of Session Equipment Utilized During Treatment: Gait belt;Rolling walker (2 wheels) Nurse Communication: Mobility status (mod A pivots)  Activity Tolerance: Patient tolerated treatment well Patient left: in chair;with call bell/phone within reach;with chair alarm set  OT Visit Diagnosis: Unsteadiness on feet  (R26.81);Other abnormalities of gait and mobility (R26.89);Ataxia, unspecified (R27.0)                Time: 6578-4696 OT Time Calculation (min): 27 min Charges:  OT General Charges $OT Visit: 1 Visit OT Evaluation $OT Eval Moderate Complexity: 1 Mod OT Treatments $Therapeutic Activity: 8-22 mins  04/08/2023  AB, OTR/L  Acute Rehabilitation Services  Office: [phone]   Tristan Schroeder 04/08/2023, 6:09 PM

## 2023-04-09 DIAGNOSIS — I639 Cerebral infarction, unspecified: Secondary | ICD-10-CM | POA: Diagnosis not present

## 2023-04-09 MED ORDER — TAMSULOSIN HCL 0.4 MG PO CAPS
0.4000 mg | ORAL_CAPSULE | Freq: Every day | ORAL | Status: DC
  Administered 2023-04-09 – 2023-04-12 (×4): 0.4 mg via ORAL
  Filled 2023-04-09 (×4): qty 1

## 2023-04-09 MED ORDER — FLUTICASONE PROPIONATE 50 MCG/ACT NA SUSP
2.0000 | Freq: Every day | NASAL | Status: DC
  Administered 2023-04-09 – 2023-04-12 (×4): 2 via NASAL
  Filled 2023-04-09: qty 16

## 2023-04-09 MED ORDER — NITROGLYCERIN 0.4 MG SL SUBL: 0.4000 mg | SUBLINGUAL_TABLET | SUBLINGUAL | Status: DC | PRN

## 2023-04-09 MED ORDER — ROSUVASTATIN CALCIUM 20 MG PO TABS
20.0000 mg | ORAL_TABLET | Freq: Every day | ORAL | Status: DC
  Administered 2023-04-09 – 2023-04-12 (×4): 20 mg via ORAL
  Filled 2023-04-09 (×5): qty 1

## 2023-04-09 MED ORDER — FAMOTIDINE 20 MG PO TABS
20.0000 mg | ORAL_TABLET | Freq: Every day | ORAL | Status: DC
  Administered 2023-04-09 – 2023-04-12 (×4): 20 mg via ORAL
  Filled 2023-04-09 (×4): qty 1

## 2023-04-09 NOTE — Progress Notes (Signed)
Inpatient Rehab Admissions Coordinator:  ? ?Per therapy recommendations,  patient was screened for CIR candidacy by Devaney Segers, MS, CCC-SLP. At this time, Pt. Appears to be a a potential candidate for CIR. I will place   order for rehab consult per protocol for full assessment. Please contact me any with questions. ? ?Trine Fread, MS, CCC-SLP ?Rehab Admissions Coordinator  ?[phone] (celll) ?[phone] (office) ? ?

## 2023-04-09 NOTE — Progress Notes (Signed)
Inpatient Rehab Admissions Coordinator:    I spoke with Pt. And wife regarding potential CIR admit. Wife can provide 24/7 supervision but little to no physical assist. I do think that supervision goals are reasonable for Pt.'s following CIR. I will open a case with insurance and pursue for admit.   Megan Salon, MS, CCC-SLP Rehab Admissions Coordinator  [phone] (celll) [phone] (office)

## 2023-04-09 NOTE — Progress Notes (Addendum)
STROKE TEAM PROGRESS NOTE   BRIEF HPI  Samuel Mata is a 89 y.o. male, PMHx of HTN, BPH, Nephrolithiasis, and Stroke who presented with left leg weakness for a couple of days.  Patient experienced left leg weakness that started Wednesday. Symptoms worsened Thursday at 6 PM and wife noticed. Denies confusion, word finding difficulties or dysarthria. Denies any issues currently. Daughter bedside and feels mental status is gradually worsening at home, not as sharp. Prior stroke was similar and he was discharged with [facility] therapy.    At that time, he denied visual change, nausea, vomiting, dysarthria, or other symptoms.   LKW: Unclear IV Thrombolysis: Unclear time of onset  EVT: No LVO  NIHSS 2(left facial weakness and left leg drift)  SIGNIFICANT HOSPITAL EVENTS 11/22 MRI Acute ischemic right thalamocapsular infarct   INTERIM HISTORY/SUBJECTIVE Patient has been hemodynamically stable overnight, and his neurological exam is stable.  His last loop recorder interrogation on 11/17 demonstrated no atrial fibrillation. OBJECTIVE  CBC    Component Value Date/Time   WBC 9.0 04/08/2023 0000   RBC 4.70 04/08/2023 0000   HGB 15.0 04/08/2023 0000   HGB 14.5 04/08/2023 0000   HCT 44.0 04/08/2023 0000   HCT 43.8 04/08/2023 0000   PLT 361 04/08/2023 0000   MCV 93.2 04/08/2023 0000   MCH 30.9 04/08/2023 0000   MCHC 33.1 04/08/2023 0000   RDW 12.0 04/08/2023 0000   LYMPHSABS 1.5 04/08/2023 0000   MONOABS 1.5 (H) 04/08/2023 0000   EOSABS 0.3 04/08/2023 0000   BASOSABS 0.1 04/08/2023 0000    BMET    Component Value Date/Time   NA 138 04/08/2023 0000   NA 137 04/08/2023 0000   K 4.3 04/08/2023 0000   K 4.2 04/08/2023 0000   CL 102 04/08/2023 0000   CL 104 04/08/2023 0000   CO2 23 04/08/2023 0000   GLUCOSE 98 04/08/2023 0000   GLUCOSE 99 04/08/2023 0000   BUN 21 04/08/2023 0000   BUN 18 04/08/2023 0000   CREATININE 0.90 04/08/2023 0000   CREATININE 1.04 04/08/2023 0000   CREATININE  1.03 08/22/2020 1443   CALCIUM 9.0 04/08/2023 0000   GFRNONAA >60 04/08/2023 0000    IMAGING past 24 hours No results found.  Vitals:   04/09/23 0000 04/09/23 0400 04/09/23 0852 04/09/23 1259  BP: (!) 149/85 123/71 (!) 162/90 (!) 157/90  Pulse: 69 (!) 46 69 64  Resp: (!) 23 18 (!) 24 17  Temp:   98.7 F (37.1 C) 98.3 F (36.8 C)  TempSrc:   Oral Oral  SpO2: 94% 93% 94% 95%  Weight:      Height:        PHYSICAL EXAM General:  Alert, atient is elderly, thin, caucasian male, patient in no acute distress Psych:  Mood and affect appropriate for situation CV: Regular rate and rhythm on monitor Respiratory:  Regular, unlabored respirations on room air GI: Abdomen soft and nontender   NEURO:  Mental Status: AA&Ox3, patient is elderly, thin, caucasian male able to give clear and coherent history. Speech/Language: speech is without dysarthria or aphasia. Comprehension and fluency intact.  Cranial Nerves:  II: PERRL. Visual fields full.  III, IV, VI: EOMI. Eyelids elevate symmetrically.  V: Sensation is intact to light touch and symmetrical to face.  VII: Face is symmetrical resting and smiling VIII: hearing intact to voice but hard of hearing IX, X: Palate elevates symmetrically. Phonation is normal.  XII: tongue is midline without fasciculations. Motor: 4/5 strength in LLE extremity,  5 out of 5 strength in bilateral upper extremities and right lower extremity Tone: is normal and bulk is normal Sensation- Intact to light touch bilaterally. Extinction absent to light touch to DSS.   Coordination: FTN intact bilaterally.   Gait- deferred  Most Recent NIH 2    ASSESSMENT/PLAN  Acute ischemic right thalamocapsular infarct, etiology small vessel disease CT No acute intracranial abnormality. Small remote cerebellar infarct.    CTA head & neck No LVO. Moderate bilateral V1 stenosis.  MRI 9 mm acute ischemic nonhemorrhagic right thalamocapsular infarct 2D Echo EF 55 to 60%,  normal left atrial size, no interatrial shunt Loop recorder in place, negative for A-fib on last interrogation 04/03/2023 LDL 98 HgbA1c 5.4 UDS negative  VTE prophylaxis - Heparin subq aspirin 81 mg daily prior to admission, now on aspirin 81 mg daily and clopidogrel 75 mg daily for 3 weeks and then Plavix alone. Therapy recommendations:  SNF Disposition:  pending  Hx of Stroke/TIA Remote of cerebellar stroke 01/2021 admitted for aphasia, CT no acute abnormality.  Status post TNK.  CTA head and neck showed no LVO, right CCA/ICA dissection.  MRI showed left perirolandic punctate infarct.  Status post loop recorder.  Discharged on DAPT for 3 months. Loop recorder in place, negative for afib on last interrogation 11/17  Hypertension Home meds:  Metoprolol succinate 25 mg daily  BP stable For BP goal normotensive  Hyperlipidemia Home meds:  Lipitor 40 mg daily  LDL 98, goal < 70 Increased to 80 mg daily  Continue statin at discharge  Other Stroke Risk Factors ETOH use, alcohol level <10, advised to drink no more than 2 drink(s) a day Former smoker  Other Active Problems BPH  Chest pain, evaluated by cards outpatient on 11/15, home meds: metoprolol and nitroglycerin prn   Hospital day # 1  Patient seen by NP and then by MD, MD to edit note as needed.    Cortney E Ernestina Columbia , MSN, AGACNP-BC [facility] See Amion for schedule and pager information 04/09/2023 4:03 PM  ATTENDING NOTE: I reviewed above note and agree with the assessment and plan. Pt was seen and examined.   No family at bedside.  Patient sitting in chair, no acute event overnight.  No acute complains.  Awake alert, orientated x 3, no aphasia, follows simple commands.  No aphasia, no gaze palsy, no significant facial droop.  Moving all extremities symmetrically.  Reported left leg weakness but on exam no significant weakness comparing to the right.  Sensation symmetrical, bilateral finger-nose grossly  intact.  Patient stroke likely due to small vessel disease.  Loop recorder so far no A-fib.  Continue DAPT for 3 weeks and then Plavix alone.  Increase Lipitor from 40 to 80.  PT and OT recommend SNF.  For detailed assessment and plan, please refer to above/below as I have made changes wherever appropriate.   Neurology will sign off. Please call with questions. Pt will follow up with stroke clinic NP at [facility] in about 4 weeks. Thanks for the consult.   Marvel Plan, MD PhD Stroke Neurology 04/09/2023 4:57 PM      To contact Stroke Continuity provider, please refer to [email or web address]. After hours, contact General Neurology

## 2023-04-09 NOTE — Progress Notes (Signed)
PROGRESS NOTE                                                                                                                                                                                                             Patient Demographics:    Samuel Mata, is a 89 y.o. male, DOB - [birth year], ION:[id number]  Outpatient Primary MD for the patient is [facility]    LOS - 1  Admit date - 04/07/2023    Chief Complaint  Patient presents with   Leg Numbness       Brief Narrative (HPI from H&P)   89 y.o. male with medical history significant of COPD, hypertension, GERD, hereditary spherocytosis, sleep apnea who presents with left leg weakness.  No other acute complaints or recent issues, imaging in the ED confirmed acute CVA, neurology consulted and hospitalist called for admission.    Subjective:    Samuel Mata today has, No headache, No chest pain, No abdominal pain - No Nausea, No new weakness tingling or numbness, no SOB   Assessment  & Plan :   Acute CVA - 9 mm acute ischemic nonhemorrhagic right thalamocapsular infarct -Seen by stroke team, full stroke workup underway, echo stable, LDL above goal hence switched to Crestor, A1c stable, CTA with no emergent large vessel disease, have moderate bilateral V1 stenosis, waiting PT evaluation, wife requesting SNF placement if possible, continue DAPT for now per neurology after 3 weeks Plavix only.   Hypertension -Permissive hypertension ongoing, defer to neurology for reinitiation timing of home meds   Sleep apnea -not use CPAP at home, outpatient sleep study is needed.   COPD, without acute exacerbation -On room air, continue to follow   GERD -Continue to monitor   Hereditary spherocytosis - stable  Dyslipidemia.  Statin switched for better LDL control.      Condition - Extremely Guarded  Family Communication  :  wife [phone]   in detail 04/09/23  Code Status :  Full  Consults  :  Neuro  PUD Prophylaxis : Pepcid   Procedures  :     TTE -  1. Left ventricular ejection fraction, by estimation, is 55 to 60%. The left ventricle has normal function. The left ventricle has no regional wall motion abnormalities. Left ventricular diastolic parameters were normal.  2. Right ventricular  systolic function is normal. The right ventricular size is normal. There is normal pulmonary artery systolic pressure.  3. The mitral valve is abnormal. Mild mitral valve regurgitation. No evidence of mitral stenosis. There is mild holosystolic prolapse of multiple scallops of the posterior leaflet of the mitral valve.  4. The aortic valve is tricuspid. Aortic valve regurgitation is not visualized. No aortic stenosis is present.  5. The inferior vena cava is normal in size with greater than 50% respiratory variability, suggesting right atrial pressure of 3 mmHg.  6. Agitated saline contrast bubble study was negative, with no evidence of any interatrial shunt.  MRI - 1. 9 mm acute ischemic nonhemorrhagic right thalamocapsular infarct. 2. Underlying age-related cerebral atrophy with moderate chronic microvascular ischemic disease, with a few small remote bilateral cerebellar infarcts.  CTA Head and Neck - 1. Negative CTA for large vessel occlusion or other emergent finding. 2. Moderate bilateral V1 stenoses. 3. Mild atheromatous change about the carotid bifurcations and carotid siphons without hemodynamically significant stenosis. 4. Multifocal patchy and nodular densities within the visualized lungs, nonspecific, and could be either infectious or inflammatory in nature. Findings are incompletely assessed on this exam, and correlation with dedicated chest CT recommended. 5.  Aortic Atherosclerosis      Disposition Plan  :    Status is: Inpatient   DVT Prophylaxis  :    heparin injection 5,000 Units Start: 04/08/23 0815 SCDs Start: 04/08/23  8413    Lab Results  Component Value Date   PLT 361 04/08/2023    Diet :  Diet Order             Diet Heart Room service appropriate? Yes; Fluid consistency: Thin  Diet effective now                    Inpatient Medications  Scheduled Meds:  aspirin EC  81 mg Oral Daily   clopidogrel  75 mg Oral Daily   fluticasone  2 spray Each Nare Daily   heparin  5,000 Units Subcutaneous Q8H   rosuvastatin  20 mg Oral Daily   tamsulosin  0.4 mg Oral Daily   Continuous Infusions: PRN Meds:.nitroGLYCERIN  Antibiotics  :    Anti-infectives (From admission, onward)    None         Objective:   Vitals:   04/08/23 2349 04/09/23 0000 04/09/23 0400 04/09/23 0852  BP: (!) 153/81 (!) 149/85 123/71 (!) 162/90  Pulse: 67 69 (!) 46 69  Resp: (!) 22 (!) 23 18 (!) 24  Temp: 98 F (36.7 C)   98.7 F (37.1 C)  TempSrc: Oral   Oral  SpO2: 93% 94% 93% 94%  Weight:      Height:        Wt Readings from Last 3 Encounters:  04/07/23 59 kg  04/01/23 59.1 kg  12/17/21 57.6 kg     Intake/Output Summary (Last 24 hours) at 04/09/2023 0937 Last data filed at 04/09/2023 0100 Gross per 24 hour  Intake --  Output 450 ml  Net -450 ml     Physical Exam  Awake ,, mildly confused, no new F.N deficits,  [place].AT,PERRAL Supple Neck, No JVD,   Symmetrical Chest wall movement, Good air movement bilaterally, CTAB RRR,No Gallops,Rubs or new Murmurs,  +ve B.Sounds, Abd Soft, No tenderness,   No Cyanosis, Clubbing or edema        Data Review:    Recent Labs  Lab 04/08/23 0000  WBC 9.0  HGB  14.5  15.0  HCT 43.8  44.0  PLT 361  MCV 93.2  MCH 30.9  MCHC 33.1  RDW 12.0  LYMPHSABS 1.5  MONOABS 1.5*  EOSABS 0.3  BASOSABS 0.1    Recent Labs  Lab 04/08/23 0000 04/08/23 0924  NA 137  138  --   K 4.2  4.3  --   CL 104  102  --   CO2 23  --   ANIONGAP 10  --   GLUCOSE 99  98  --   BUN 18  21  --   CREATININE 1.04  0.90  --   AST 21  --   ALT 11  --   ALKPHOS  60  --   BILITOT 0.6  --   ALBUMIN 3.0*  --   INR 1.1  --   HGBA1C  --  5.4  CALCIUM 9.0  --       Recent Labs  Lab 04/08/23 0000 04/08/23 0924  INR 1.1  --   HGBA1C  --  5.4  CALCIUM 9.0  --     --------------------------------------------------------------------------------------------------------------- Lab Results  Component Value Date   CHOL 153 04/08/2023   HDL 49 04/08/2023   LDLCALC 98 04/08/2023   TRIG 31 04/08/2023   CHOLHDL 3.1 04/08/2023    Lab Results  Component Value Date   HGBA1C 5.4 04/08/2023   No results for input(s): "TSH", "T4TOTAL", "FREET4", "T3FREE", "THYROIDAB" in the last 72 hours. No results for input(s): "VITAMINB12", "FOLATE", "FERRITIN", "TIBC", "IRON", "RETICCTPCT" in the last 72 hours. ------------------------------------------------------------------------------------------------------------------ Cardiac Enzymes No results for input(s): "CKMB", "TROPONINI", "MYOGLOBIN" in the last 168 hours.  Invalid input(s): "CK"  Micro Results No results found for this or any previous visit (from the past 240 hour(s)).  Radiology Reports ECHOCARDIOGRAM COMPLETE  Result Date: 04/08/2023    ECHOCARDIOGRAM REPORT   Patient Name:   ALVIS LUBA Date of Exam: 04/08/2023 Medical Rec #:  [id number]     Height:       67.0 in Accession #:    [id number]    Weight:       130.0 lb Date of Birth:  06/23/1930    BSA:          1.684 m Patient Age:    89 years      BP:           126/81 mmHg Patient Gender: M             HR:           57 bpm. Exam Location:  Inpatient Procedure: 2D Echo, Cardiac Doppler, Color Doppler and Saline Contrast Bubble            Study Indications:    Stroke  History:        Patient has prior history of Echocardiogram examinations, most                 recent 01/14/2021. Stroke; Risk Factors:Hypertension.  Sonographer:    Milda Smart Referring Phys: [id number] CORTNEY E DE LA TORRE  Sonographer Comments: Image acquisition challenging due to  patient body habitus. IMPRESSIONS  1. Left ventricular ejection fraction, by estimation, is 55 to 60%. The left ventricle has normal function. The left ventricle has no regional wall motion abnormalities. Left ventricular diastolic parameters were normal.  2. Right ventricular systolic function is normal. The right ventricular size is normal. There is normal pulmonary artery systolic pressure.  3. The mitral valve is abnormal. Mild mitral valve regurgitation.  No evidence of mitral stenosis. There is mild holosystolic prolapse of multiple scallops of the posterior leaflet of the mitral valve.  4. The aortic valve is tricuspid. Aortic valve regurgitation is not visualized. No aortic stenosis is present.  5. The inferior vena cava is normal in size with greater than 50% respiratory variability, suggesting right atrial pressure of 3 mmHg.  6. Agitated saline contrast bubble study was negative, with no evidence of any interatrial shunt. Comparison(s): No significant change from prior study. Conclusion(s)/Recommendation(s): Otherwise normal echocardiogram, with minor abnormalities described in the report. No intracardiac source of embolism detected on this transthoracic study. Consider a transesophageal echocardiogram to exclude cardiac source of embolism if clinically indicated. FINDINGS  Left Ventricle: Left ventricular ejection fraction, by estimation, is 55 to 60%. The left ventricle has normal function. The left ventricle has no regional wall motion abnormalities. The left ventricular internal cavity size was normal in size. There is  no left ventricular hypertrophy. Left ventricular diastolic parameters were normal. Right Ventricle: The right ventricular size is normal. No increase in right ventricular wall thickness. Right ventricular systolic function is normal. There is normal pulmonary artery systolic pressure. The tricuspid regurgitant velocity is 1.90 m/s, and  with an assumed right atrial pressure of 3 mmHg,  the estimated right ventricular systolic pressure is 17.4 mmHg. Left Atrium: Left atrial size was normal in size. Right Atrium: Right atrial size was normal in size. Pericardium: There is no evidence of pericardial effusion. Mitral Valve: The mitral valve is abnormal. There is mild holosystolic prolapse of multiple scallops of the posterior leaflet of the mitral valve. Mild mitral valve regurgitation. No evidence of mitral valve stenosis. MV peak gradient, 5.1 mmHg. The mean  mitral valve gradient is 1.0 mmHg. Tricuspid Valve: The tricuspid valve is normal in structure. Tricuspid valve regurgitation is trivial. No evidence of tricuspid stenosis. Aortic Valve: The aortic valve is tricuspid. Aortic valve regurgitation is not visualized. No aortic stenosis is present. Pulmonic Valve: The pulmonic valve was grossly normal. Pulmonic valve regurgitation is trivial. No evidence of pulmonic stenosis. Aorta: The aortic root, ascending aorta and aortic arch are all structurally normal, with no evidence of dilitation or obstruction. Venous: The inferior vena cava is normal in size with greater than 50% respiratory variability, suggesting right atrial pressure of 3 mmHg. IAS/Shunts: No atrial level shunt detected by color flow Doppler. Agitated saline contrast was given intravenously to evaluate for intracardiac shunting. Agitated saline contrast bubble study was negative, with no evidence of any interatrial shunt.  LEFT VENTRICLE PLAX 2D LVIDd:         4.80 cm     Diastology LVIDs:         3.00 cm     LV e' medial:    4.79 cm/s LV PW:         1.00 cm     LV E/e' medial:  13.8 LV IVS:        1.00 cm     LV e' lateral:   5.77 cm/s LVOT diam:     2.10 cm     LV E/e' lateral: 11.5 LV SV:         84 LV SV Index:   50 LVOT Area:     3.46 cm  LV Volumes (MOD) LV vol d, MOD A2C: 97.3 ml LV vol d, MOD A4C: 73.5 ml LV vol s, MOD A2C: 37.6 ml LV vol s, MOD A4C: 31.2 ml LV SV MOD A2C:  59.7 ml LV SV MOD A4C:     73.5 ml LV SV MOD BP:       49.4 ml RIGHT VENTRICLE             IVC RV Basal diam:  3.90 cm     IVC diam: 1.80 cm RV S prime:     13.10 cm/s TAPSE (M-mode): 2.8 cm LEFT ATRIUM             Index        RIGHT ATRIUM           Index LA diam:        2.50 cm 1.48 cm/m   RA Area:     12.50 cm LA Vol (A2C):   26.9 ml 15.97 ml/m  RA Volume:   28.60 ml  16.98 ml/m LA Vol (A4C):   29.0 ml 17.22 ml/m LA Biplane Vol: 28.3 ml 16.81 ml/m  AORTIC VALVE LVOT Vmax:   116.00 cm/s LVOT Vmean:  79.000 cm/s LVOT VTI:    0.243 m  AORTA Ao Root diam: 3.50 cm Ao Asc diam:  3.70 cm MITRAL VALVE               TRICUSPID VALVE MV Area (PHT): 1.98 cm    TR Peak grad:   14.4 mmHg MV Area VTI:   2.77 cm    TR Vmax:        190.00 cm/s MV Peak grad:  5.1 mmHg MV Mean grad:  1.0 mmHg    SHUNTS MV Vmax:       1.13 m/s    Systemic VTI:  0.24 m MV Vmean:      56.4 cm/s   Systemic Diam: 2.10 cm MV Decel Time: 383 msec MR Peak grad: 100.8 mmHg MR Mean grad: 74.0 mmHg MR Vmax:      502.00 cm/s MR Vmean:     415.0 cm/s MV E velocity: 66.20 cm/s MV A velocity: 83.10 cm/s MV E/A ratio:  0.80 Jodelle Red MD Electronically signed by Jodelle Red MD Signature Date/Time: 04/08/2023/7:43:07 PM    Final    MR BRAIN WO CONTRAST  Result Date: 04/08/2023 CLINICAL DATA:  Initial evaluation for acute neuro deficit, stroke. EXAM: MRI HEAD WITHOUT CONTRAST TECHNIQUE: Multiplanar, multiecho pulse sequences of the brain and surrounding structures were obtained without intravenous contrast. COMPARISON:  Prior CTs from earlier the same day. FINDINGS: Brain: Generalized age-related cerebral atrophy. Patchy T2/FLAIR hyperintensity involving the periventricular and deep white matter both cerebral hemispheres, consistent with chronic small vessel ischemic disease, moderately advanced in nature. Mild patchy involvement of the pons noted. Few small remote bilateral cerebellar infarcts noted. 9 mm focus of restricted diffusion seen involving the right thalamocapsular  region, consistent with a small acute ischemic infarct. No associated hemorrhage or mass effect. No other acute or subacute ischemia. Gray-white matter differentiation otherwise maintained. No acute intracranial hemorrhage. Single chronic microhemorrhage noted at the left parietal lobe. No mass lesion, midline shift or mass effect. No hydrocephalus or extra-axial fluid collection. Pituitary gland and suprasellar region within normal limits. Vascular: Major intracranial vascular flow voids are maintained. Skull and upper cervical spine: Craniocervical junction within normal limits. Bone marrow signal intensity normal. No scalp soft tissue abnormality. Sinuses/Orbits: Globes and orbital soft tissues within normal limits. Paranasal sinuses are largely clear. No significant mastoid effusion. Other: None. IMPRESSION: 1. 9 mm acute ischemic nonhemorrhagic right thalamocapsular infarct. 2. Underlying age-related cerebral atrophy with moderate chronic microvascular ischemic disease, with a few small  remote bilateral cerebellar infarcts. Electronically Signed   By: Rise Mu M.D.   On: 04/08/2023 03:53   DG Chest Port 1 View  Result Date: 04/08/2023 CLINICAL DATA:  Weakness EXAM: PORTABLE CHEST 1 VIEW COMPARISON:  07/22/2020 FINDINGS: Cardiac shadow is within normal limits. Loop recorder is now seen. Lungs are well aerated bilaterally. Diffuse fibrotic changes are noted bilaterally. A somewhat nodular density is noted in the right upper lobe incompletely evaluated on this exam. No sizable effusion is seen. No other focal abnormality is noted. IMPRESSION: Nodular density in the right upper lobe incompletely evaluated on this exam. CT of the chest would be helpful for further evaluation. Chronic fibrotic changes. Electronically Signed   By: Alcide Clever M.D.   On: 04/08/2023 02:10   CT HEAD WO CONTRAST  Result Date: 04/08/2023 CLINICAL DATA:  Initial evaluation for neuro deficit, stroke suspected,  left-sided weakness. EXAM: CT ANGIOGRAPHY HEAD AND NECK TECHNIQUE: Multidetector CT imaging of the head and neck was performed using the standard protocol during bolus administration of intravenous contrast. Multiplanar CT image reconstructions and MIPs were obtained to evaluate the vascular anatomy. Carotid stenosis measurements (when applicable) are obtained utilizing NASCET criteria, using the distal internal carotid diameter as the denominator. RADIATION DOSE REDUCTION: This exam was performed according to the departmental dose-optimization program which includes automated exposure control, adjustment of the mA and/or kV according to patient size and/or use of iterative reconstruction technique. CONTRAST:  75mL OMNIPAQUE IOHEXOL 350 MG/ML SOLN COMPARISON:  Prior study from 01/13/2021. FINDINGS: CT HEAD FINDINGS Brain: Cerebral volume within normal limits. Mild chronic microvascular ischemic disease. Small remote left cerebellar infarct. No acute intracranial hemorrhage. No acute large vessel territory infarct. No mass lesion or midline shift. No hydrocephalus or extra-axial fluid collection. Vascular: No abnormal hyperdense vessel. Calcified atherosclerosis present at skull base. Skull: Scalp soft tissues within normal limits.  Calvarium intact. Sinuses/Orbits: Globes and orbital soft tissues within normal limits. Paranasal sinuses are largely clear. No mastoid effusion. Other: None. Review of the MIP images confirms the above findings CTA NECK FINDINGS Aortic arch: Visualized aortic arch within normal limits for caliber with standard branch pattern. Aortic atherosclerosis. No significant stenosis about the origin the great vessels. Right carotid system: Right common and internal carotid arteries are patent without dissection or stenosis. Left carotid system: Left common and internal carotid arteries are patent without dissection or stenosis. Vertebral arteries: Both vertebral arteries arise from the subclavian  arteries. Atheromatous change at the origin of the right vertebral artery with moderate stenosis. Additional focal moderate stenosis involving the contralateral left V1 segment noted. Vertebral arteries otherwise patent without dissection. Skeleton: No discrete or worrisome osseous lesions. Moderate spondylosis present at C5-6 and C6-7. Other neck: No other acute finding. Upper chest: Multifocal patchy and nodular densities within the visualized lungs, nonspecific, and could be either infectious or inflammatory in nature. Review of the MIP images confirms the above findings CTA HEAD FINDINGS Anterior circulation: Mild atheromatous change about the carotid siphons without hemodynamically significant stenosis. A1 segments patent bilaterally. Normal anterior communicating artery complex. Anterior cerebral arteries patent without stenosis. No M1 stenosis or occlusion. Distal MCA branches perfused and symmetric. Posterior circulation: Both V4 segments widely patent. Left vertebral artery slightly dominant. Both PICA patent. Basilar patent without stenosis. Superior cerebellar and posterior cerebral arteries patent bilaterally. Venous sinuses: Grossly patent allowing for timing the contrast bolus. Anatomic variants: None significant.  No aneurysm. Review of the MIP images confirms the above findings IMPRESSION: CT HEAD: 1.  No acute intracranial abnormality. 2. 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 CTA HEAD AND NECK: 1. Negative CTA for large vessel occlusion or other emergent finding. 2. Moderate bilateral V1 stenoses. 3. Mild atheromatous change about the carotid bifurcations and carotid siphons without hemodynamically significant stenosis. 4. Multifocal patchy and nodular densities within the visualized lungs, nonspecific, and could be either infectious or inflammatory in nature. Findings are incompletely assessed on this exam, and correlation with dedicated chest CT  recommended. 5.  Aortic Atherosclerosis (ICD10-I70.0). Electronically Signed   By: Rise Mu M.D.   On: 04/08/2023 01:49   CT ANGIO HEAD NECK W WO CM  Result Date: 04/08/2023 CLINICAL DATA:  Initial evaluation for neuro deficit, stroke suspected, left-sided weakness. EXAM: CT ANGIOGRAPHY HEAD AND NECK TECHNIQUE: Multidetector CT imaging of the head and neck was performed using the standard protocol during bolus administration of intravenous contrast. Multiplanar CT image reconstructions and MIPs were obtained to evaluate the vascular anatomy. Carotid stenosis measurements (when applicable) are obtained utilizing NASCET criteria, using the distal internal carotid diameter as the denominator. RADIATION DOSE REDUCTION: This exam was performed according to the departmental dose-optimization program which includes automated exposure control, adjustment of the mA and/or kV according to patient size and/or use of iterative reconstruction technique. CONTRAST:  75mL OMNIPAQUE IOHEXOL 350 MG/ML SOLN COMPARISON:  Prior study from 01/13/2021. FINDINGS: CT HEAD FINDINGS Brain: Cerebral volume within normal limits. Mild chronic microvascular ischemic disease. Small remote left cerebellar infarct. No acute intracranial hemorrhage. No acute large vessel territory infarct. No mass lesion or midline shift. No hydrocephalus or extra-axial fluid collection. Vascular: No abnormal hyperdense vessel. Calcified atherosclerosis present at skull base. Skull: Scalp soft tissues within normal limits.  Calvarium intact. Sinuses/Orbits: Globes and orbital soft tissues within normal limits. Paranasal sinuses are largely clear. No mastoid effusion. Other: None. Review of the MIP images confirms the above findings CTA NECK FINDINGS Aortic arch: Visualized aortic arch within normal limits for caliber with standard branch pattern. Aortic atherosclerosis. No significant stenosis about the origin the great vessels. Right carotid system:  Right common and internal carotid arteries are patent without dissection or stenosis. Left carotid system: Left common and internal carotid arteries are patent without dissection or stenosis. Vertebral arteries: Both vertebral arteries arise from the subclavian arteries. Atheromatous change at the origin of the right vertebral artery with moderate stenosis. Additional focal moderate stenosis involving the contralateral left V1 segment noted. Vertebral arteries otherwise patent without dissection. Skeleton: No discrete or worrisome osseous lesions. Moderate spondylosis present at C5-6 and C6-7. Other neck: No other acute finding. Upper chest: Multifocal patchy and nodular densities within the visualized lungs, nonspecific, and could be either infectious or inflammatory in nature. Review of the MIP images confirms the above findings CTA HEAD FINDINGS Anterior circulation: Mild atheromatous change about the carotid siphons without hemodynamically significant stenosis. A1 segments patent bilaterally. Normal anterior communicating artery complex. Anterior cerebral arteries patent without stenosis. No M1 stenosis or occlusion. Distal MCA branches perfused and symmetric. Posterior circulation: Both V4 segments widely patent. Left vertebral artery slightly dominant. Both PICA patent. Basilar patent without stenosis. Superior cerebellar and posterior cerebral arteries patent bilaterally. Venous sinuses: Grossly patent allowing for timing the contrast bolus. Anatomic variants: None significant.  No aneurysm. Review of the MIP images confirms the above findings IMPRESSION: CT HEAD: 1. No acute intracranial abnormality. 2. 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 CTA HEAD AND NECK: 1. Negative CTA for  large vessel occlusion or other emergent finding. 2. Moderate bilateral V1 stenoses. 3. Mild atheromatous change about the carotid bifurcations and carotid siphons without  hemodynamically significant stenosis. 4. Multifocal patchy and nodular densities within the visualized lungs, nonspecific, and could be either infectious or inflammatory in nature. Findings are incompletely assessed on this exam, and correlation with dedicated chest CT recommended. 5.  Aortic Atherosclerosis (ICD10-I70.0). Electronically Signed   By: Rise Mu M.D.   On: 04/08/2023 01:49      Signature  -   Susa Raring M.D on 04/09/2023 at 9:37 AM   -  To page go to [email or web address]

## 2023-04-09 NOTE — Evaluation (Signed)
Physical Therapy Evaluation Patient Details Name: Samuel Mata MRN: [id number] DOB: 03-25-1931 Today's Date: 04/09/2023  History of Present Illness  89 y/o male presented to ED on 04/07/23 with left leg weakness,word finding difficulties, and dysarthria. MRI confirmed acute CVA, right thalamocapsular infarct. PMH: HTN, remote cerebellar stroke, renal stones, kyphoplasty  Clinical Impression  Pt is presenting below baseline level of functioning. Currently pt requires supervision for bed mobility and Min a for sit to stand and gait with RW due to impaired balance and strength with what appears to be mild L side neglect. Pt lives with elderly spouse and does not have physical assistance at home. Due to pt current functional status, home set up and available assistance at home recommending skilled physical therapy services focus on strengthening, balance and functional mobility < 3 hours/day in order to decrease risk for falls, injury and re-hospitalization. Pt tolerated treatment session well.       If plan is discharge home, recommend the following: A little help with walking and/or transfers;Assist for transportation;Assistance with cooking/housework;Help with stairs or ramp for entrance   Can travel by private vehicle   No    Equipment Recommendations Other (comment) (defer to post acute)     Functional Status Assessment Patient has had a recent decline in their functional status and demonstrates the ability to make significant improvements in function in a reasonable and predictable amount of time.     Precautions / Restrictions Precautions Precautions: Fall Restrictions Weight Bearing Restrictions: No      Mobility  Bed Mobility Overal bed mobility: Needs Assistance Bed Mobility: Supine to Sit, Sit to Supine     Supine to sit: Supervision Sit to supine: Supervision   General bed mobility comments: supervision for safety.    Transfers Overall transfer level: Needs  assistance Equipment used: Rolling walker (2 wheels) Transfers: Sit to/from Stand Sit to Stand: Min assist           General transfer comment: Min A for hand placement and stabilization on standing.    Ambulation/Gait Ambulation/Gait assistance: Min assist Gait Distance (Feet): 50 Feet Assistive device: Rolling walker (2 wheels) Gait Pattern/deviations: Step-to pattern, Decreased step length - left, Drifts right/left   Gait velocity interpretation: <1.8 ft/sec, indicate of risk for recurrent falls   General Gait Details: Pt tends to drift L due to L neglect with verbal cues for large steps with L foot, improved slightly with time. Min A for balance and staying upright with short distance gait.   Modified Rankin (Stroke Patients Only) Modified Rankin (Stroke Patients Only) Pre-Morbid Rankin Score: No significant disability Modified Rankin: Moderate disability     Balance Overall balance assessment: Needs assistance Sitting-balance support: No upper extremity supported, Feet supported Sitting balance-Leahy Scale: Fair     Standing balance support: Reliant on assistive device for balance, Bilateral upper extremity supported Standing balance-Leahy Scale: Poor Standing balance comment: Min A for balance in standing.           Pertinent Vitals/Pain Pain Assessment Pain Assessment: No/denies pain    Home Living Family/patient expects to be discharged to:: Private residence Living Arrangements: Spouse/significant other Available Help at Discharge: Family;Available 24 hours/day (not able to physically help) Type of Home: House Home Access: Stairs to enter Entrance Stairs-Rails: None Entrance Stairs-Number of Steps: 2   Home Layout: One level Home Equipment: Shower seat;Cane - single point;BSC/3in1;Grab bars - tub/shower (BSC in storage) Additional Comments: wife walks with a RW, he reports his wife is using his RW. pt  does more sponge baths than a shower    Prior  Function Prior Level of Function : Independent/Modified Independent             Mobility Comments: ind with spc since last CVA years ago ADLs Comments: Ind in ADLs, wife completes iADls     Extremity/Trunk Assessment   Upper Extremity Assessment Upper Extremity Assessment: Defer to OT evaluation    Lower Extremity Assessment Lower Extremity Assessment: Generalized weakness    Cervical / Trunk Assessment Cervical / Trunk Assessment: Kyphotic  Communication   Communication Communication: Hearing impairment Cueing Techniques: Verbal cues;Tactile cues  Cognition Arousal: Alert Behavior During Therapy: WFL for tasks assessed/performed Overall Cognitive Status: Within Functional Limits for tasks assessed     General Comments: Overall WFL, HOH so uncertain if truly slow processing        General Comments General comments (skin integrity, edema, etc.): Hr/O2 sats remained WNL during session.        Assessment/Plan    PT Assessment Patient needs continued PT services  PT Problem List Decreased mobility;Decreased strength;Decreased balance;Decreased safety awareness       PT Treatment Interventions DME instruction;Therapeutic exercise;Gait training;Balance training;Stair training;Functional mobility training;Therapeutic activities;Patient/family education;Neuromuscular re-education    PT Goals (Current goals can be found in the Care Plan section)  Acute Rehab PT Goals Patient Stated Goal: to get better and go home. PT Goal Formulation: With patient Time For Goal Achievement: 04/23/23 Potential to Achieve Goals: Fair    Frequency Min 1X/week        AM-PAC PT "6 Clicks" Mobility  Outcome Measure Help needed turning from your back to your side while in a flat bed without using bedrails?: A Little Help needed moving from lying on your back to sitting on the side of a flat bed without using bedrails?: A Little Help needed moving to and from a bed to a chair  (including a wheelchair)?: A Little Help needed standing up from a chair using your arms (e.g., wheelchair or bedside chair)?: A Little Help needed to walk in hospital room?: A Little Help needed climbing 3-5 steps with a railing? : A Little 6 Click Score: 18    End of Session Equipment Utilized During Treatment: Gait belt Activity Tolerance: Patient tolerated treatment well Patient left: in chair;with call bell/phone within reach;with chair alarm set Nurse Communication: Mobility status PT Visit Diagnosis: Unsteadiness on feet (R26.81);Other abnormalities of gait and mobility (R26.89)    Time: 1206-1229 PT Time Calculation (min) (ACUTE ONLY): 23 min   Charges:   PT Evaluation $PT Eval Low Complexity: 1 Low PT Treatments $Gait Training: 8-22 mins PT General Charges $$ ACUTE PT VISIT: 1 Visit         Harrel Carina, DPT, CLT  Acute Rehabilitation Services Office: [phone] (Secure chat preferred)   Claudia Desanctis 04/09/2023, 2:00 PM

## 2023-04-10 DIAGNOSIS — I639 Cerebral infarction, unspecified: Secondary | ICD-10-CM | POA: Diagnosis not present

## 2023-04-10 MED ORDER — CARMEX CLASSIC LIP BALM EX OINT: TOPICAL_OINTMENT | CUTANEOUS | Status: DC | PRN

## 2023-04-10 NOTE — Progress Notes (Signed)
PROGRESS NOTE                                                                                                                                                                                                             Patient Demographics:    Samuel Mata, is a 89 y.o. male, DOB - 1930/08/28, ZOX:[id number]  Outpatient Primary MD for the patient is [facility]    LOS - 2  Admit date - 04/07/2023    Chief Complaint  Patient presents with   Leg Numbness       Brief Narrative (HPI from H&P)   89 y.o. male with medical history significant of COPD, hypertension, GERD, hereditary spherocytosis, sleep apnea who presents with left leg weakness.  No other acute complaints or recent issues, imaging in the ED confirmed acute CVA, neurology consulted and hospitalist called for admission.    Subjective:    Patient in bed, appears comfortable, denies any headache, no fever, no chest pain or pressure, no shortness of breath , no abdominal pain. No new focal weakness.   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   Hypertension -Permissive hypertension ongoing, defer to neurology for reinitiation timing of home meds   Sleep apnea -not use CPAP at home, outpatient sleep study is needed.   COPD, without acute exacerbation -On room air, continue to follow   GERD -Continue to monitor   Hereditary spherocytosis - stable  Dyslipidemia.  Statin switched for better LDL control.      Condition - Extremely Guarded  Family Communication  :  wife  [phone]  in detail 04/09/23  Code Status :  Full  Consults  :  Neuro  PUD Prophylaxis : Pepcid   Procedures  :     TTE -  1. Left ventricular ejection fraction, by estimation, is 55 to 60%. The left ventricle has normal function. The left ventricle has no regional wall motion abnormalities. Left ventricular diastolic parameters were normal.  2. Right  ventricular systolic function is normal. The right ventricular size is normal. There is normal pulmonary artery systolic pressure.  3. The mitral valve is abnormal. Mild mitral valve regurgitation. No evidence of mitral stenosis. There is mild holosystolic prolapse of multiple scallops of the posterior leaflet of the mitral valve.  4. The aortic valve is tricuspid. Aortic valve regurgitation is not visualized. No aortic stenosis is present.  5. The inferior vena cava is normal in size with greater than 50% respiratory variability, suggesting right atrial pressure of 3 mmHg.  6. Agitated saline contrast bubble study was negative, with no evidence of any interatrial shunt.  MRI - 1. 9 mm acute ischemic nonhemorrhagic right thalamocapsular infarct. 2. Underlying age-related cerebral atrophy with moderate chronic microvascular ischemic disease, with a few small remote bilateral cerebellar infarcts.  CTA Head and Neck - 1. Negative CTA for large vessel occlusion or other emergent finding. 2. Moderate bilateral V1 stenoses. 3. Mild atheromatous change about the carotid bifurcations and carotid siphons without hemodynamically significant stenosis. 4. Multifocal patchy and nodular densities within the visualized lungs, nonspecific, and could be either infectious or inflammatory in nature. Findings are incompletely assessed on this exam, and correlation with dedicated chest CT recommended. 5.  Aortic Atherosclerosis      Disposition Plan  :    Status is: Inpatient   DVT Prophylaxis  :    heparin injection 5,000 Units Start: 04/08/23 0815 SCDs  Start: 04/08/23 1610    Lab Results  Component Value Date   PLT 361 04/08/2023    Diet :  Diet Order             Diet Heart Room service appropriate? Yes; Fluid consistency: Thin  Diet effective now                    Inpatient Medications  Scheduled Meds:  aspirin EC  81 mg Oral Daily   clopidogrel  75 mg Oral Daily   famotidine  20 mg Oral Daily   fluticasone  2 spray Each Nare Daily   heparin  5,000 Units Subcutaneous Q8H   rosuvastatin  20 mg Oral Daily   tamsulosin  0.4 mg Oral Daily   Continuous Infusions: PRN Meds:.nitroGLYCERIN  Antibiotics  :    Anti-infectives (From admission, onward)    None         Objective:   Vitals:   04/09/23 1942 04/10/23 0009 04/10/23 0410 04/10/23 0411  BP: 121/70 138/71 (!) 167/80 (!) 159/75  Pulse: 72 64  66  Resp: 16 18  17   Temp: 98.1 F (36.7 C) (!) 97.3 F (36.3 C)  98.7 F (37.1 C)  TempSrc: Oral Oral  Oral  SpO2: 97% 98%  97%  Weight:      Height:        Wt Readings from Last 3 Encounters:  04/07/23 59 kg  04/01/23 59.1 kg  12/17/21 57.6 kg     Intake/Output Summary (Last 24 hours) at 04/10/2023 0853 Last data filed at 04/09/2023 2150 Gross per 24 hour  Intake --  Output 600 ml  Net -600 ml     Physical Exam  Awake, mildly confused, no new F.N deficits,  [place].AT,PERRAL Supple Neck, No JVD,   Symmetrical Chest wall movement, Good air movement bilaterally, CTAB RRR,No Gallops,Rubs or new Murmurs,  +ve B.Sounds, Abd Soft, No tenderness,   No Cyanosis, Clubbing or edema        Data Review:    Recent Labs  Lab  04/08/23 0000  WBC 9.0  HGB 14.5  15.0  HCT 43.8  44.0  PLT 361  MCV 93.2  MCH 30.9  MCHC 33.1  RDW 12.0  LYMPHSABS 1.5  MONOABS 1.5*  EOSABS 0.3  BASOSABS 0.1    Recent Labs  Lab 04/08/23 0000 04/08/23 0924  NA 137  138  --   K 4.2  4.3  --   CL 104  102  --   CO2 23  --   ANIONGAP 10  --   GLUCOSE 99  98  --   BUN 18  21  --   CREATININE 1.04   0.90  --   AST 21  --   ALT 11  --   ALKPHOS 60  --   BILITOT 0.6  --   ALBUMIN 3.0*  --   INR 1.1  --   HGBA1C  --  5.4  CALCIUM 9.0  --       Recent Labs  Lab 04/08/23 0000 04/08/23 0924  INR 1.1  --   HGBA1C  --  5.4  CALCIUM 9.0  --     --------------------------------------------------------------------------------------------------------------- Lab Results  Component Value Date   CHOL 153 04/08/2023   HDL 49 04/08/2023   LDLCALC 98 04/08/2023   TRIG 31 04/08/2023   CHOLHDL 3.1 04/08/2023    Lab Results  Component Value Date   HGBA1C 5.4 04/08/2023   No results for input(s): "TSH", "T4TOTAL", "FREET4", "T3FREE", "THYROIDAB" in the last 72 hours. No results for input(s): "VITAMINB12", "FOLATE", "FERRITIN", "TIBC", "IRON", "RETICCTPCT" in the last 72 hours. ------------------------------------------------------------------------------------------------------------------ Cardiac Enzymes No results for input(s): "CKMB", "TROPONINI", "MYOGLOBIN" in the last 168 hours.  Invalid input(s): "CK"  Micro Results No results found for this or any previous visit (from the past 240 hour(s)).  Radiology Reports ECHOCARDIOGRAM COMPLETE  Result Date: 04/08/2023    ECHOCARDIOGRAM REPORT   Patient Name:   Samuel Mata Date of Exam: 04/08/2023 Medical Rec #:  [id number]     Height:       67.0 in Accession #:    [id number]    Weight:       130.0 lb Date of Birth:  1930/11/30    BSA:          1.684 m Patient Age:    89 years      BP:           126/81 mmHg Patient Gender: M             HR:           57 bpm. Exam Location:  Inpatient Procedure: 2D Echo, Cardiac Doppler, Color Doppler and Saline Contrast Bubble            Study Indications:    Stroke  History:        Patient has prior history of Echocardiogram examinations, most                 recent 01/14/2021. Stroke; Risk Factors:Hypertension.  Sonographer:    Milda Smart Referring Phys: [id number] CORTNEY E DE LA TORRE  Sonographer  Comments: Image acquisition challenging due to patient body habitus. IMPRESSIONS  1. Left ventricular ejection fraction, by estimation, is 55 to 60%. The left ventricle has normal function. The left ventricle has no regional wall motion abnormalities. Left ventricular diastolic parameters were normal.  2. Right ventricular systolic function is normal. The right ventricular size is normal. There is normal pulmonary artery systolic pressure.  3. The mitral  valve is abnormal. Mild mitral valve regurgitation. No evidence of mitral stenosis. There is mild holosystolic prolapse of multiple scallops of the posterior leaflet of the mitral valve.  4. The aortic valve is tricuspid. Aortic valve regurgitation is not visualized. No aortic stenosis is present.  5. The inferior vena cava is normal in size with greater than 50% respiratory variability, suggesting right atrial pressure of 3 mmHg.  6. Agitated saline contrast bubble study was negative, with no evidence of any interatrial shunt. Comparison(s): No significant change from prior study. Conclusion(s)/Recommendation(s): Otherwise normal echocardiogram, with minor abnormalities described in the report. No intracardiac source of embolism detected on this transthoracic study. Consider a transesophageal echocardiogram to exclude cardiac source of embolism if clinically indicated. FINDINGS  Left Ventricle: Left ventricular ejection fraction, by estimation, is 55 to 60%. The left ventricle has normal function. The left ventricle has no regional wall motion abnormalities. The left ventricular internal cavity size was normal in size. There is  no left ventricular hypertrophy. Left ventricular diastolic parameters were normal. Right Ventricle: The right ventricular size is normal. No increase in right ventricular wall thickness. Right ventricular systolic function is normal. There is normal pulmonary artery systolic pressure. The tricuspid regurgitant velocity is 1.90 m/s, and   with an assumed right atrial pressure of 3 mmHg, the estimated right ventricular systolic pressure is 17.4 mmHg. Left Atrium: Left atrial size was normal in size. Right Atrium: Right atrial size was normal in size. Pericardium: There is no evidence of pericardial effusion. Mitral Valve: The mitral valve is abnormal. There is mild holosystolic prolapse of multiple scallops of the posterior leaflet of the mitral valve. Mild mitral valve regurgitation. No evidence of mitral valve stenosis. MV peak gradient, 5.1 mmHg. The mean  mitral valve gradient is 1.0 mmHg. Tricuspid Valve: The tricuspid valve is normal in structure. Tricuspid valve regurgitation is trivial. No evidence of tricuspid stenosis. Aortic Valve: The aortic valve is tricuspid. Aortic valve regurgitation is not visualized. No aortic stenosis is present. Pulmonic Valve: The pulmonic valve was grossly normal. Pulmonic valve regurgitation is trivial. No evidence of pulmonic stenosis. Aorta: The aortic root, ascending aorta and aortic arch are all structurally normal, with no evidence of dilitation or obstruction. Venous: The inferior vena cava is normal in size with greater than 50% respiratory variability, suggesting right atrial pressure of 3 mmHg. IAS/Shunts: No atrial level shunt detected by color flow Doppler. Agitated saline contrast was given intravenously to evaluate for intracardiac shunting. Agitated saline contrast bubble study was negative, with no evidence of any interatrial shunt.  LEFT VENTRICLE PLAX 2D LVIDd:         4.80 cm     Diastology LVIDs:         3.00 cm     LV e' medial:    4.79 cm/s LV PW:         1.00 cm     LV E/e' medial:  13.8 LV IVS:        1.00 cm     LV e' lateral:   5.77 cm/s LVOT diam:     2.10 cm     LV E/e' lateral: 11.5 LV SV:         84 LV SV Index:   50 LVOT Area:     3.46 cm  LV Volumes (MOD) LV vol d, MOD A2C: 97.3 ml LV vol d, MOD A4C: 73.5 ml LV vol s, MOD A2C: 37.6 ml LV vol s, MOD A4C: 31.2 ml LV  SV MOD A2C:      59.7 ml LV SV MOD A4C:     73.5 ml LV SV MOD BP:      49.4 ml RIGHT VENTRICLE             IVC RV Basal diam:  3.90 cm     IVC diam: 1.80 cm RV S prime:     13.10 cm/s TAPSE (M-mode): 2.8 cm LEFT ATRIUM             Index        RIGHT ATRIUM           Index LA diam:        2.50 cm 1.48 cm/m   RA Area:     12.50 cm LA Vol (A2C):   26.9 ml 15.97 ml/m  RA Volume:   28.60 ml  16.98 ml/m LA Vol (A4C):   29.0 ml 17.22 ml/m LA Biplane Vol: 28.3 ml 16.81 ml/m  AORTIC VALVE LVOT Vmax:   116.00 cm/s LVOT Vmean:  79.000 cm/s LVOT VTI:    0.243 m  AORTA Ao Root diam: 3.50 cm Ao Asc diam:  3.70 cm MITRAL VALVE               TRICUSPID VALVE MV Area (PHT): 1.98 cm    TR Peak grad:   14.4 mmHg MV Area VTI:   2.77 cm    TR Vmax:        190.00 cm/s MV Peak grad:  5.1 mmHg MV Mean grad:  1.0 mmHg    SHUNTS MV Vmax:       1.13 m/s    Systemic VTI:  0.24 m MV Vmean:      56.4 cm/s   Systemic Diam: 2.10 cm MV Decel Time: 383 msec MR Peak grad: 100.8 mmHg MR Mean grad: 74.0 mmHg MR Vmax:      502.00 cm/s MR Vmean:     415.0 cm/s MV E velocity: 66.20 cm/s MV A velocity: 83.10 cm/s MV E/A ratio:  0.80 Jodelle Red MD Electronically signed by Jodelle Red MD Signature Date/Time: 04/08/2023/7:43:07 PM    Final    MR BRAIN WO CONTRAST  Result Date: 04/08/2023 CLINICAL DATA:  Initial evaluation for acute neuro deficit, stroke. EXAM: MRI HEAD WITHOUT CONTRAST TECHNIQUE: Multiplanar, multiecho pulse sequences of the brain and surrounding structures were obtained without intravenous contrast. COMPARISON:  Prior CTs from earlier the same day. FINDINGS: Brain: Generalized age-related cerebral atrophy. Patchy T2/FLAIR hyperintensity involving the periventricular and deep white matter both cerebral hemispheres, consistent with chronic small vessel ischemic disease, moderately advanced in nature. Mild patchy involvement of the pons noted. Few small remote bilateral cerebellar infarcts noted. 9 mm focus of restricted  diffusion seen involving the right thalamocapsular region, consistent with a small acute ischemic infarct. No associated hemorrhage or mass effect. No other acute or subacute ischemia. Gray-white matter differentiation otherwise maintained. No acute intracranial hemorrhage. Single chronic microhemorrhage noted at the left parietal lobe. No mass lesion, midline shift or mass effect. No hydrocephalus or extra-axial fluid collection. Pituitary gland and suprasellar region within normal limits. Vascular: Major intracranial vascular flow voids are maintained. Skull and upper cervical spine: Craniocervical junction within normal limits. Bone marrow signal intensity normal. No scalp soft tissue abnormality. Sinuses/Orbits: Globes and orbital soft tissues within normal limits. Paranasal sinuses are largely clear. No significant mastoid effusion. Other: None. IMPRESSION: 1. 9 mm acute ischemic nonhemorrhagic right thalamocapsular infarct. 2. Underlying age-related cerebral atrophy with moderate chronic  microvascular ischemic disease, with a few small remote bilateral cerebellar infarcts. Electronically Signed   By: Rise Mu M.D.   On: 04/08/2023 03:53   DG Chest Port 1 View  Result Date: 04/08/2023 CLINICAL DATA:  Weakness EXAM: PORTABLE CHEST 1 VIEW COMPARISON:  07/22/2020 FINDINGS: Cardiac shadow is within normal limits. Loop recorder is now seen. Lungs are well aerated bilaterally. Diffuse fibrotic changes are noted bilaterally. A somewhat nodular density is noted in the right upper lobe incompletely evaluated on this exam. No sizable effusion is seen. No other focal abnormality is noted. IMPRESSION: Nodular density in the right upper lobe incompletely evaluated on this exam. CT of the chest would be helpful for further evaluation. Chronic fibrotic changes. Electronically Signed   By: Alcide Clever M.D.   On: 04/08/2023 02:10   CT HEAD WO CONTRAST  Result Date: 04/08/2023 CLINICAL DATA:  Initial  evaluation for neuro deficit, stroke suspected, left-sided weakness. EXAM: CT ANGIOGRAPHY HEAD AND NECK TECHNIQUE: Multidetector CT imaging of the head and neck was performed using the standard protocol during bolus administration of intravenous contrast. Multiplanar CT image reconstructions and MIPs were obtained to evaluate the vascular anatomy. Carotid stenosis measurements (when applicable) are obtained utilizing NASCET criteria, using the distal internal carotid diameter as the denominator. RADIATION DOSE REDUCTION: This exam was performed according to the departmental dose-optimization program which includes automated exposure control, adjustment of the mA and/or kV according to patient size and/or use of iterative reconstruction technique. CONTRAST:  75mL OMNIPAQUE IOHEXOL 350 MG/ML SOLN COMPARISON:  Prior study from 01/13/2021. FINDINGS: CT HEAD FINDINGS Brain: Cerebral volume within normal limits. Mild chronic microvascular ischemic disease. Small remote left cerebellar infarct. No acute intracranial hemorrhage. No acute large vessel territory infarct. No mass lesion or midline shift. No hydrocephalus or extra-axial fluid collection. Vascular: No abnormal hyperdense vessel. Calcified atherosclerosis present at skull base. Skull: Scalp soft tissues within normal limits.  Calvarium intact. Sinuses/Orbits: Globes and orbital soft tissues within normal limits. Paranasal sinuses are largely clear. No mastoid effusion. Other: None. Review of the MIP images confirms the above findings CTA NECK FINDINGS Aortic arch: Visualized aortic arch within normal limits for caliber with standard branch pattern. Aortic atherosclerosis. No significant stenosis about the origin the great vessels. Right carotid system: Right common and internal carotid arteries are patent without dissection or stenosis. Left carotid system: Left common and internal carotid arteries are patent without dissection or stenosis. Vertebral arteries:  Both vertebral arteries arise from the subclavian arteries. Atheromatous change at the origin of the right vertebral artery with moderate stenosis. Additional focal moderate stenosis involving the contralateral left V1 segment noted. Vertebral arteries otherwise patent without dissection. Skeleton: No discrete or worrisome osseous lesions. Moderate spondylosis present at C5-6 and C6-7. Other neck: No other acute finding. Upper chest: Multifocal patchy and nodular densities within the visualized lungs, nonspecific, and could be either infectious or inflammatory in nature. Review of the MIP images confirms the above findings CTA HEAD FINDINGS Anterior circulation: Mild atheromatous change about the carotid siphons without hemodynamically significant stenosis. A1 segments patent bilaterally. Normal anterior communicating artery complex. Anterior cerebral arteries patent without stenosis. No M1 stenosis or occlusion. Distal MCA branches perfused and symmetric. Posterior circulation: Both V4 segments widely patent. Left vertebral artery slightly dominant. Both PICA patent. Basilar patent without stenosis. Superior cerebellar and posterior cerebral arteries patent bilaterally. Venous sinuses: Grossly patent allowing for timing the contrast bolus. Anatomic variants: None significant.  No aneurysm. Review of the MIP images confirms  the above findings IMPRESSION: CT HEAD: 1. No acute intracranial abnormality. 2. 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 CTA HEAD AND NECK: 1. Negative CTA for large vessel occlusion or other emergent finding. 2. Moderate bilateral V1 stenoses. 3. Mild atheromatous change about the carotid bifurcations and carotid siphons without hemodynamically significant stenosis. 4. Multifocal patchy and nodular densities within the visualized lungs, nonspecific, and could be either infectious or inflammatory in nature. Findings are incompletely assessed on this  exam, and correlation with dedicated chest CT recommended. 5.  Aortic Atherosclerosis (ICD10-I70.0). Electronically Signed   By: Rise Mu M.D.   On: 04/08/2023 01:49   CT ANGIO HEAD NECK W WO CM  Result Date: 04/08/2023 CLINICAL DATA:  Initial evaluation for neuro deficit, stroke suspected, left-sided weakness. EXAM: CT ANGIOGRAPHY HEAD AND NECK TECHNIQUE: Multidetector CT imaging of the head and neck was performed using the standard protocol during bolus administration of intravenous contrast. Multiplanar CT image reconstructions and MIPs were obtained to evaluate the vascular anatomy. Carotid stenosis measurements (when applicable) are obtained utilizing NASCET criteria, using the distal internal carotid diameter as the denominator. RADIATION DOSE REDUCTION: This exam was performed according to the departmental dose-optimization program which includes automated exposure control, adjustment of the mA and/or kV according to patient size and/or use of iterative reconstruction technique. CONTRAST:  75mL OMNIPAQUE IOHEXOL 350 MG/ML SOLN COMPARISON:  Prior study from 01/13/2021. FINDINGS: CT HEAD FINDINGS Brain: Cerebral volume within normal limits. Mild chronic microvascular ischemic disease. Small remote left cerebellar infarct. No acute intracranial hemorrhage. No acute large vessel territory infarct. No mass lesion or midline shift. No hydrocephalus or extra-axial fluid collection. Vascular: No abnormal hyperdense vessel. Calcified atherosclerosis present at skull base. Skull: Scalp soft tissues within normal limits.  Calvarium intact. Sinuses/Orbits: Globes and orbital soft tissues within normal limits. Paranasal sinuses are largely clear. No mastoid effusion. Other: None. Review of the MIP images confirms the above findings CTA NECK FINDINGS Aortic arch: Visualized aortic arch within normal limits for caliber with standard branch pattern. Aortic atherosclerosis. No significant stenosis about the  origin the great vessels. Right carotid system: Right common and internal carotid arteries are patent without dissection or stenosis. Left carotid system: Left common and internal carotid arteries are patent without dissection or stenosis. Vertebral arteries: Both vertebral arteries arise from the subclavian arteries. Atheromatous change at the origin of the right vertebral artery with moderate stenosis. Additional focal moderate stenosis involving the contralateral left V1 segment noted. Vertebral arteries otherwise patent without dissection. Skeleton: No discrete or worrisome osseous lesions. Moderate spondylosis present at C5-6 and C6-7. Other neck: No other acute finding. Upper chest: Multifocal patchy and nodular densities within the visualized lungs, nonspecific, and could be either infectious or inflammatory in nature. Review of the MIP images confirms the above findings CTA HEAD FINDINGS Anterior circulation: Mild atheromatous change about the carotid siphons without hemodynamically significant stenosis. A1 segments patent bilaterally. Normal anterior communicating artery complex. Anterior cerebral arteries patent without stenosis. No M1 stenosis or occlusion. Distal MCA branches perfused and symmetric. Posterior circulation: Both V4 segments widely patent. Left vertebral artery slightly dominant. Both PICA patent. Basilar patent without stenosis. Superior cerebellar and posterior cerebral arteries patent bilaterally. Venous sinuses: Grossly patent allowing for timing the contrast bolus. Anatomic variants: None significant.  No aneurysm. Review of the MIP images confirms the above findings IMPRESSION: CT HEAD: 1. No acute intracranial abnormality. 2. 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 CTA  HEAD AND NECK: 1. Negative CTA for large vessel occlusion or other emergent finding. 2. Moderate bilateral V1 stenoses. 3. Mild atheromatous change about the carotid  bifurcations and carotid siphons without hemodynamically significant stenosis. 4. Multifocal patchy and nodular densities within the visualized lungs, nonspecific, and could be either infectious or inflammatory in nature. Findings are incompletely assessed on this exam, and correlation with dedicated chest CT recommended. 5.  Aortic Atherosclerosis (ICD10-I70.0). Electronically Signed   By: Rise Mu M.D.   On: 04/08/2023 01:49      Signature  -   Susa Raring M.D on 04/10/2023 at 8:53 AM   -  To page go to [email or web address]

## 2023-04-10 NOTE — Progress Notes (Signed)
Mobility Specialist Progress Note    04/10/23 1416  Mobility  Activity Ambulated with assistance in hallway  Level of Assistance Moderate assist, patient does 50-74%  Assistive Device Front wheel walker  Distance Ambulated (ft) 70 ft  Activity Response Tolerated well  Mobility Referral Yes  $Mobility charge 1 Mobility  Mobility Specialist Start Time (ACUTE ONLY) 1404  Mobility Specialist Stop Time (ACUTE ONLY) 1415  Mobility Specialist Time Calculation (min) (ACUTE ONLY) 11 min   Pt received in chair and agreeable. No complaints. Pt with L lateral and posterior lean from neglect. Encouraged bigger strides with left leg. Returned to bathroom to attempt BM. Encouraged to pull string when ready to get up. Daughter present in room.   [place] Mobility Specialist  Please Neurosurgeon or Rehab Office at [phone]

## 2023-04-11 ENCOUNTER — Inpatient Hospital Stay (HOSPITAL_COMMUNITY): Payer: Medicare HMO

## 2023-04-11 ENCOUNTER — Encounter (HOSPITAL_COMMUNITY): Payer: Self-pay | Admitting: Internal Medicine

## 2023-04-11 DIAGNOSIS — I639 Cerebral infarction, unspecified: Secondary | ICD-10-CM | POA: Diagnosis not present

## 2023-04-11 LAB — CBC WITH DIFFERENTIAL/PLATELET
Abs Immature Granulocytes: 0.03 10*3/uL (ref 0.00–0.07)
Basophils Absolute: 0 10*3/uL (ref 0.0–0.1)
Basophils Relative: 1 %
Eosinophils Absolute: 0.2 10*3/uL (ref 0.0–0.5)
Eosinophils Relative: 2 %
HCT: 40.5 % (ref 39.0–52.0)
Hemoglobin: 13.7 g/dL (ref 13.0–17.0)
Immature Granulocytes: 0 %
Lymphocytes Relative: 17 %
Lymphs Abs: 1.4 10*3/uL (ref 0.7–4.0)
MCH: 31 pg (ref 26.0–34.0)
MCHC: 33.8 g/dL (ref 30.0–36.0)
MCV: 91.6 fL (ref 80.0–100.0)
Monocytes Absolute: 1.6 10*3/uL — ABNORMAL HIGH (ref 0.1–1.0)
Monocytes Relative: 20 %
Neutro Abs: 4.8 10*3/uL (ref 1.7–7.7)
Neutrophils Relative %: 60 %
Platelets: 345 10*3/uL (ref 150–400)
RBC: 4.42 MIL/uL (ref 4.22–5.81)
RDW: 12.2 % (ref 11.5–15.5)
WBC: 8 10*3/uL (ref 4.0–10.5)
nRBC: 0 % (ref 0.0–0.2)

## 2023-04-11 LAB — MAGNESIUM: Magnesium: 1.9 mg/dL (ref 1.7–2.4)

## 2023-04-11 LAB — BASIC METABOLIC PANEL
Anion gap: 4 — ABNORMAL LOW (ref 5–15)
BUN: 15 mg/dL (ref 8–23)
CO2: 27 mmol/L (ref 22–32)
Calcium: 8.6 mg/dL — ABNORMAL LOW (ref 8.9–10.3)
Chloride: 100 mmol/L (ref 98–111)
Creatinine, Ser: 0.95 mg/dL (ref 0.61–1.24)
GFR, Estimated: >60 mL/min (ref >60)
Glucose, Bld: 94 mg/dL (ref 70–99)
Potassium: 3.8 mmol/L (ref 3.5–5.1)
Sodium: 131 mmol/L — ABNORMAL LOW (ref 135–145)

## 2023-04-11 NOTE — PMR Pre-admission (Signed)
PMR Admission Coordinator Pre-Admission Assessment  Patient: Samuel Mata is an 89 y.o., male MRN: [id number] DOB: [birth year] Height: 5\' 7"  (170.2 cm) Weight: 59 kg  Insurance Information HMO: yes    PPO:      PCP:      IPA:      80/20:      OTHER:  PRIMARY: [employer] Medicare      Policy#: ***      Subscriber: Pt CM Name: ***      Phone#: ***     Fax#: *** Pre-Cert#: ***      Employer: *** Benefits:  Phone #: ***     Name: *** Eff. Date: ***     Deduct: ***      Out of Pocket Max: ***      Life Max: *** CIR: ***      SNF: *** Outpatient: ***     Co-Pay: *** Home Health: ***      Co-Pay: *** DME: ***     Co-Pay: *** Providers: *** SECONDARY:       Policy#:      Phone#:   Financial Counselor:       Phone#:   The "Data Collection Information Summary" for patients in Inpatient Rehabilitation Facilities with attached "Privacy Act Statement-Health Care Records" was provided and verbally reviewed with: Patient  Emergency Contact Information Contact Information     Name Relation Home Work Mobile   [facility] Spouse [phone]  [phone]   Karas, Machnik Daughter   [phone]      Other Contacts   None on File     Current Medical History  Patient Admitting Diagnosis: CVA History of Present Illness: Samuel Mata is a 89 y.o. male with medical history significant of COPD, hypertension, GERD, hereditary spherocytosis, sleep apnea who presented to the [facility] Emergency Department on 04/07/22  with left leg weakness.  CT of the head showed no acute intracranial abnormality. Small remote cerebellar infarct.    CTA head & neck showed no LVO and Moderate bilateral V1 stenosis.  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 2D Echo with EF 55 to 60%, normal left atrial size, no interatrial shunt. Loop recorder in place, negative for A-fib on last interrogation 04/03/2023. LDL 98, HgbA1c 5.4, UDS negative.  For VTE  prophylaxis, Pt. on Heparin subcutaneous. Pt. Was on aspirin 81 mg daily prior to admission, now on aspirin 81 mg daily and clopidogrel 75 mg daily for 3 weeks and then Plavix alone. Pt. Seen by PT/OT/SLP and they recommend CIR to assist return to PLOF. Complete NIHSS TOTAL: 0  Patient's medical record from [facility] has been reviewed by the rehabilitation admission coordinator and physician.  Past Medical History  Past Medical History:  Diagnosis Date   Bronchiectasis without complication (HCC)    Enlarged prostate    Hearing loss    Hypertension    Pulmonary nocardiosis (HCC) 2021   treated with 6 month antibiotic course   Renal stones    Stroke (HCC) 2022   left frontoparietal   TIA (transient ischemic attack) 2019    Has the patient had major surgery during 100 days prior to admission? No  Family History   family history is not on file.  Current Medications  Current Facility-Administered Medications:    aspirin EC tablet 81 mg, 81 mg, Oral, Daily, Rejeana Brock, MD, 81 mg at 04/11/23 0854   clopidogrel (PLAVIX) tablet 75 mg, 75 mg,  Oral, Daily, Rejeana Brock, MD, 75 mg at 04/11/23 0854   famotidine (PEPCID) tablet 20 mg, 20 mg, Oral, Daily, Susa Raring K, MD, 20 mg at 04/11/23 0854   fluticasone (FLONASE) 50 MCG/ACT nasal spray 2 spray, 2 spray, Each Nare, Daily, Leroy Sea, MD, 2 spray at 04/11/23 0855   heparin injection 5,000 Units, 5,000 Units, Subcutaneous, Q8H, Azucena Fallen, MD, 5,000 Units at 04/11/23 0650   nitroGLYCERIN (NITROSTAT) SL tablet 0.4 mg, 0.4 mg, Sublingual, Q5 min PRN, Leroy Sea, MD   rosuvastatin (CRESTOR) tablet 20 mg, 20 mg, Oral, Daily, Leroy Sea, MD, 20 mg at 04/11/23 0854   tamsulosin (FLOMAX) capsule 0.4 mg, 0.4 mg, Oral, Daily, Leroy Sea, MD, 0.4 mg at 04/11/23 2130  Patients Current Diet:  Diet Order             Diet Heart Room service appropriate? Yes; Fluid  consistency: Thin  Diet effective now                   Precautions / Restrictions Precautions Precautions: Fall Restrictions Weight Bearing Restrictions: No   Has the patient had 2 or more falls or a fall with injury in the past year? No  Prior Activity Level Community (5-7x/wk): Pt. active in the community PTA  Prior Functional Level Self Care: Did the patient need help bathing, dressing, using the toilet or eating? Independent  Indoor Mobility: Did the patient need assistance with walking from room to room (with or without device)? Independent  Stairs: Did the patient need assistance with internal or external stairs (with or without device)? Independent  Functional Cognition: Did the patient need help planning regular tasks such as shopping or remembering to take medications? Independent  Patient Information Are you of Hispanic, Latino/a,or Spanish origin?: A. No, not of Hispanic, Latino/a, or Spanish origin What is your race?: A. White Do you need or want an interpreter to communicate with a doctor or health care staff?: 0. No  Patient's Response To:  Health Literacy and Transportation Is the patient able to respond to health literacy and transportation needs?: Yes Health Literacy - How often do you need to have someone help you when you read instructions, pamphlets, or other written material from your doctor or pharmacy?: Never In the past 12 months, has lack of transportation kept you from medical appointments or from getting medications?: No In the past 12 months, has lack of transportation kept you from meetings, work, or from getting things needed for daily living?: No  Journalist, newspaper / Equipment Home Equipment: Shower seat, Medical laboratory scientific officer - single point, BSC/3in1, Grab bars - tub/shower (BSC in storage)  Prior Device Use: Indicate devices/aids used by the patient prior to current illness, exacerbation or injury? None of the above  Current Functional  Level Cognition  Overall Cognitive Status: Within Functional Limits for tasks assessed Orientation Level: Oriented X4 General Comments: Overall WFL, HOH so uncertain if truly slow processing    Extremity Assessment (includes Sensation/Coordination)  Upper Extremity Assessment: Defer to OT evaluation  Lower Extremity Assessment: Generalized weakness    ADLs  Overall ADL's : Needs assistance/impaired Eating/Feeding: Independent, Sitting Grooming: Set up, Sitting, Wash/dry face Upper Body Bathing: Sitting, Set up Lower Body Bathing: Sitting/lateral leans, Contact guard assist Upper Body Dressing : Sitting, Set up Lower Body Dressing: Sitting/lateral leans, Supervision/safety, Set up Lower Body Dressing Details (indicate cue type and reason): donned/doffed socks, can achieve figure four but needed light assist to position  LLE into figure four to don socks Toilet Transfer: Moderate assistance, Tax adviser Details (indicate cue type and reason): potential need for Max A with fatigue Toileting- Clothing Manipulation and Hygiene: Maximal assistance, Sit to/from stand    Mobility  Overal bed mobility: Needs Assistance Bed Mobility: Supine to Sit, Sit to Supine Supine to sit: Supervision Sit to supine: Supervision General bed mobility comments: supervision for safety.    Transfers  Overall transfer level: Needs assistance Equipment used: Rolling walker (2 wheels) Transfers: Sit to/from Stand Sit to Stand: Min assist General transfer comment: Min A for hand placement and stabilization on standing.    Ambulation / Gait / Stairs / Wheelchair Mobility  Ambulation/Gait Ambulation/Gait assistance: Editor, commissioning (Feet): 50 Feet Assistive device: Rolling walker (2 wheels) Gait Pattern/deviations: Step-to pattern, Decreased step length - left, Drifts right/left General Gait Details: Pt tends to drift L due to L neglect with verbal cues for large steps with L foot,  improved slightly with time. Min A for balance and staying upright with short distance gait. Gait velocity interpretation: <1.8 ft/sec, indicate of risk for recurrent falls    Posture / Balance Dynamic Sitting Balance Sitting balance - Comments: up in recliner Balance Overall balance assessment: Needs assistance Sitting-balance support: No upper extremity supported, Feet supported Sitting balance-Leahy Scale: Fair Sitting balance - Comments: up in recliner Postural control: Left lateral lean (in standing) Standing balance support: Reliant on assistive device for balance, Bilateral upper extremity supported Standing balance-Leahy Scale: Poor Standing balance comment: Min A for balance in standing.    Special needs/care consideration Skin ***   Previous Home Environment (from acute therapy documentation) Living Arrangements: Spouse/significant other Available Help at Discharge: Family, Available 24 hours/day (not able to physically help) Type of Home: House Home Layout: One level Home Access: Stairs to enter Entrance Stairs-Rails: None Entrance Stairs-Number of Steps: 2 Bathroom Shower/Tub: Walk-in shower (half step) Firefighter: Standard Bathroom Accessibility: Yes How Accessible: Accessible via walker Home Care Services: No Additional Comments: wife walks with a RW, he reports his wife is using his RW. pt does more sponge baths than a shower  Discharge Living Setting Plans for Discharge Living Setting: Patient's home Type of Home at Discharge: House Discharge Home Layout: One level Discharge Home Access: Stairs to enter Entrance Stairs-Rails: None Entrance Stairs-Number of Steps: 2 Discharge Bathroom Shower/Tub: Walk-in shower Discharge Bathroom Toilet: Standard Discharge Bathroom Accessibility: No Does the patient have any problems obtaining your medications?: No  Social/Family/Support Systems Patient Roles: Spouse Contact Information: [phone] Anticipated  Caregiver: Natasha Mead Anticipated Caregiver's Contact Information: 24/7 Ability/Limitations of Caregiver: Supervision only Caregiver Availability: 24/7 Discharge Plan Discussed with Primary Caregiver: Yes Is Caregiver In Agreement with Plan?: Yes Does Caregiver/Family have Issues with Lodging/Transportation while Pt is in Rehab?: No  Goals Patient/Family Goal for Rehab: PT/OT/SLP Supervision level Expected length of stay: 7-10 days Pt/Family Agrees to Admission and willing to participate: Yes Program Orientation Provided & Reviewed with Pt/Caregiver Including Roles  & Responsibilities: Yes  Decrease burden of Care through IP rehab admission: not anticipated  Possible need for SNF placement upon discharge: not anticipated  Patient Condition: I have reviewed medical records from [facility], spoken with CM, and spouse and daughter. I met with patient at the bedside for inpatient rehabilitation assessment.  Patient will benefit from ongoing PT, OT, and SLP, can actively participate in 3 hours of therapy a day 5 days of the week, and can make measurable gains during the  admission.  Patient will also benefit from the coordinated team approach during an Inpatient Acute Rehabilitation admission.  The patient will receive intensive therapy as well as Rehabilitation physician, nursing, social worker, and care management interventions.  Due to safety, skin/wound care, disease management, medication administration, pain management, and patient education the patient requires 24 hour a day rehabilitation nursing.  The patient is currently *** with mobility and basic ADLs.  Discharge setting and therapy post discharge at home with home health is anticipated.  Patient has agreed to participate in the Acute Inpatient Rehabilitation Program and will admit {Time; today/tomorrow:10263}.  Preadmission Screen Completed By:  Jeronimo Greaves, 04/11/2023 2:05  PM ______________________________________________________________________   Discussed status with Dr. Marland Kitchen on *** at *** and received approval for admission today.  Admission Coordinator:  Jeronimo Greaves, CCC-SLP, time Marland KitchenDorna Bloom ***   Assessment/Plan: Diagnosis: Does the need for close, 24 hr/day Medical supervision in concert with the patient's rehab needs make it unreasonable for this patient to be served in a less intensive setting? {yes_no_potentially:3041433} Co-Morbidities requiring supervision/potential complications: *** Due to {due WU:9811914}, does the patient require 24 hr/day rehab nursing? {yes_no_potentially:3041433} Does the patient require coordinated care of a physician, rehab nurse, PT, OT, and SLP to address physical and functional deficits in the context of the above medical diagnosis(es)? {yes_no_potentially:3041433} Addressing deficits in the following areas: {deficits:3041436} Can the patient actively participate in an intensive therapy program of at least 3 hrs of therapy 5 days a week? {yes_no_potentially:3041433} The potential for patient to make measurable gains while on inpatient rehab is {potential:3041437} Anticipated functional outcomes upon discharge from inpatient rehab: {functional outcomes:304600100} PT, {functional outcomes:304600100} OT, {functional outcomes:304600100} SLP Estimated rehab length of stay to reach the above functional goals is: *** Anticipated discharge destination: {anticipated dc setting:21604} 10. Overall Rehab/Functional Prognosis: {potential:3041437}   MD Signature: ***

## 2023-04-11 NOTE — Progress Notes (Signed)
PROGRESS NOTE                                                                                                                                                                                                             Patient Demographics:    Samuel Mata, is a 89 y.o. male, DOB - Dec 01, 1930, XLK:[id number]  Outpatient Primary MD for the patient is [facility]    LOS - 3  Admit date - 04/07/2023    Chief Complaint  Patient presents with   Leg Numbness       Brief Narrative (HPI from H&P)   89 y.o. male with medical history significant of COPD, hypertension, GERD, hereditary spherocytosis, sleep apnea who presents with left leg weakness.  No other acute complaints or recent issues, imaging in the ED confirmed acute CVA, neurology consulted and hospitalist called for admission.    Subjective:   Patient in bed, appears comfortable, denies any headache, no fever, no chest pain or pressure, no shortness of breath , no abdominal pain. No focal weakness.   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   Hypertension -Permissive hypertension ongoing, will gradually reintroduce blood pressure medications starting 04/12/2023   Sleep apnea -not use CPAP at home, outpatient sleep study is needed.   COPD, without acute exacerbation -On room air, continue to follow   GERD -Continue to monitor   Hereditary spherocytosis - stable  Dyslipidemia.  Statin switched for better LDL control.      Condition - Extremely Guarded  Family  Communication  :  wife [phone]  in detail 04/09/23  Code Status :  Full  Consults  :  Neuro  PUD Prophylaxis : Pepcid   Procedures  :     TTE -  1. Left ventricular ejection fraction, by estimation, is 55 to 60%. The left ventricle has normal function. The left ventricle has no regional wall motion abnormalities. Left ventricular diastolic parameters were normal.  2. Right ventricular  systolic function is normal. The right ventricular size is normal. There is normal pulmonary artery systolic pressure.  3. The mitral valve is abnormal. Mild mitral valve regurgitation. No evidence of mitral stenosis. There is mild holosystolic prolapse of multiple scallops of the posterior leaflet of the mitral valve.  4. The aortic valve is tricuspid. Aortic valve regurgitation is not visualized. No aortic stenosis is present.  5. The inferior vena cava is normal in size with greater than 50% respiratory variability, suggesting right atrial pressure of 3 mmHg.  6. Agitated saline contrast bubble study was negative, with no evidence of any interatrial shunt.  MRI - 1. 9 mm acute ischemic nonhemorrhagic right thalamocapsular infarct. 2. Underlying age-related cerebral atrophy with moderate chronic microvascular ischemic disease, with a few small remote bilateral cerebellar infarcts.  CTA Head and Neck - 1. Negative CTA for large vessel occlusion or other emergent finding. 2. Moderate bilateral V1 stenoses. 3. Mild atheromatous change about the carotid bifurcations and carotid siphons without hemodynamically significant stenosis. 4. Multifocal patchy and nodular densities within the visualized lungs, nonspecific, and could be either infectious or inflammatory in nature. Findings are incompletely assessed on this exam, and correlation with dedicated chest CT recommended. 5.  Aortic Atherosclerosis      Disposition Plan  :    Status is: Inpatient   DVT Prophylaxis  :    heparin injection 5,000 Units Start:  04/08/23 0815 SCDs Start: 04/08/23 1191    Lab Results  Component Value Date   PLT 345 04/11/2023    Diet :  Diet Order             Diet Heart Room service appropriate? Yes; Fluid consistency: Thin  Diet effective now                    Inpatient Medications  Scheduled Meds:  aspirin EC  81 mg Oral Daily   clopidogrel  75 mg Oral Daily   famotidine  20 mg Oral Daily   fluticasone  2 spray Each Nare Daily   heparin  5,000 Units Subcutaneous Q8H   rosuvastatin  20 mg Oral Daily   tamsulosin  0.4 mg Oral Daily   Continuous Infusions: PRN Meds:.nitroGLYCERIN  Antibiotics  :    Anti-infectives (From admission, onward)    None         Objective:   Vitals:   04/10/23 2020 04/10/23 2314 04/10/23 2315 04/11/23 0410  BP: 115/70 120/66  (!) 154/73  Pulse:  76  69  Resp:  18 18 18   Temp:  97.9 F (36.6 C)  98.4 F (36.9 C)  TempSrc:  Oral Oral   SpO2:  93%  91%  Weight:      Height:        Wt Readings from Last 3 Encounters:  04/07/23 59 kg  04/01/23 59.1 kg  12/17/21 57.6 kg     Intake/Output Summary (Last 24 hours) at 04/11/2023 0901 Last data filed at 04/10/2023 2000 Gross per 24 hour  Intake 150 ml  Output 1000 ml  Net -850 ml     Physical Exam  Awake, mildly confused, no new F.N deficits,  [place].AT,PERRAL Supple Neck, No JVD,   Symmetrical Chest wall movement, Good air movement bilaterally, CTAB RRR,No Gallops,Rubs or new Murmurs,  +ve B.Sounds, Abd Soft, No tenderness,   No Cyanosis, Clubbing or edema        Data Review:    Recent Labs  Lab 04/08/23 0000 04/11/23 0427  WBC 9.0 8.0  HGB 14.5  15.0 13.7  HCT 43.8  44.0 40.5  PLT 361 345  MCV 93.2 91.6  MCH 30.9 31.0  MCHC 33.1 33.8  RDW 12.0 12.2  LYMPHSABS 1.5 1.4  MONOABS 1.5* 1.6*  EOSABS 0.3 0.2  BASOSABS 0.1 0.0    Recent Labs  Lab 04/08/23 0000 04/08/23 0924 04/11/23 0427  NA 137  138  --  131*  K 4.2  4.3  --  3.8  CL 104  102  --  100  CO2 23  --   27  ANIONGAP 10  --  4*  GLUCOSE 99  98  --  94  BUN 18  21  --  15  CREATININE 1.04  0.90  --  0.95  AST 21  --   --   ALT 11  --   --   ALKPHOS 60  --   --   BILITOT 0.6  --   --   ALBUMIN 3.0*  --   --   INR 1.1  --   --   HGBA1C  --  5.4  --   MG  --   --  1.9  CALCIUM 9.0  --  8.6*      Recent Labs  Lab 04/08/23 0000 04/08/23 0924 04/11/23 0427  INR 1.1  --   --   HGBA1C  --  5.4  --   MG  --   --  1.9  CALCIUM 9.0  --  8.6*    --------------------------------------------------------------------------------------------------------------- Lab Results  Component Value Date   CHOL 153 04/08/2023   HDL 49 04/08/2023   LDLCALC 98 04/08/2023   TRIG 31 04/08/2023   CHOLHDL 3.1 04/08/2023    Lab Results  Component Value Date   HGBA1C 5.4 04/08/2023   No results for input(s): "TSH", "T4TOTAL", "FREET4", "T3FREE", "THYROIDAB" in the last 72 hours. No results for input(s): "VITAMINB12", "FOLATE", "FERRITIN", "TIBC", "IRON", "RETICCTPCT" in the last 72 hours. ------------------------------------------------------------------------------------------------------------------ Cardiac Enzymes No results for input(s): "CKMB", "TROPONINI", "MYOGLOBIN" in the last 168 hours.  Invalid input(s): "CK"  Micro Results No results found for this or any previous visit (from the past 240 hour(s)).  Radiology Reports ECHOCARDIOGRAM COMPLETE  Result Date: 04/08/2023    ECHOCARDIOGRAM REPORT   Patient Name:   Samuel Mata Date of Exam: 04/08/2023 Medical Rec #:  [id number]     Height:       67.0 in Accession #:    [id number]    Weight:       130.0 lb Date of Birth:  Oct 05, 1930    BSA:          1.684 m Patient Age:    89 years      BP:           126/81 mmHg Patient Gender: M             HR:           57 bpm. Exam Location:  Inpatient Procedure: 2D Echo, Cardiac Doppler, Color Doppler and Saline Contrast Bubble            Study Indications:    Stroke  History:        Patient has prior  history of Echocardiogram examinations, most                 recent 01/14/2021. Stroke; Risk Factors:Hypertension.  Sonographer:    Milda Smart Referring Phys: [id number] Lennox Solders DE LA TORRE  Sonographer  Comments: Image acquisition challenging due to patient body habitus. IMPRESSIONS  1. Left ventricular ejection fraction, by estimation, is 55 to 60%. The left ventricle has normal function. The left ventricle has no regional wall motion abnormalities. Left ventricular diastolic parameters were normal.  2. Right ventricular systolic function is normal. The right ventricular size is normal. There is normal pulmonary artery systolic pressure.  3. The mitral valve is abnormal. Mild mitral valve regurgitation. No evidence of mitral stenosis. There is mild holosystolic prolapse of multiple scallops of the posterior leaflet of the mitral valve.  4. The aortic valve is tricuspid. Aortic valve regurgitation is not visualized. No aortic stenosis is present.  5. The inferior vena cava is normal in size with greater than 50% respiratory variability, suggesting right atrial pressure of 3 mmHg.  6. Agitated saline contrast bubble study was negative, with no evidence of any interatrial shunt. Comparison(s): No significant change from prior study. Conclusion(s)/Recommendation(s): Otherwise normal echocardiogram, with minor abnormalities described in the report. No intracardiac source of embolism detected on this transthoracic study. Consider a transesophageal echocardiogram to exclude cardiac source of embolism if clinically indicated. FINDINGS  Left Ventricle: Left ventricular ejection fraction, by estimation, is 55 to 60%. The left ventricle has normal function. The left ventricle has no regional wall motion abnormalities. The left ventricular internal cavity size was normal in size. There is  no left ventricular hypertrophy. Left ventricular diastolic parameters were normal. Right Ventricle: The right ventricular size is  normal. No increase in right ventricular wall thickness. Right ventricular systolic function is normal. There is normal pulmonary artery systolic pressure. The tricuspid regurgitant velocity is 1.90 m/s, and  with an assumed right atrial pressure of 3 mmHg, the estimated right ventricular systolic pressure is 17.4 mmHg. Left Atrium: Left atrial size was normal in size. Right Atrium: Right atrial size was normal in size. Pericardium: There is no evidence of pericardial effusion. Mitral Valve: The mitral valve is abnormal. There is mild holosystolic prolapse of multiple scallops of the posterior leaflet of the mitral valve. Mild mitral valve regurgitation. No evidence of mitral valve stenosis. MV peak gradient, 5.1 mmHg. The mean  mitral valve gradient is 1.0 mmHg. Tricuspid Valve: The tricuspid valve is normal in structure. Tricuspid valve regurgitation is trivial. No evidence of tricuspid stenosis. Aortic Valve: The aortic valve is tricuspid. Aortic valve regurgitation is not visualized. No aortic stenosis is present. Pulmonic Valve: The pulmonic valve was grossly normal. Pulmonic valve regurgitation is trivial. No evidence of pulmonic stenosis. Aorta: The aortic root, ascending aorta and aortic arch are all structurally normal, with no evidence of dilitation or obstruction. Venous: The inferior vena cava is normal in size with greater than 50% respiratory variability, suggesting right atrial pressure of 3 mmHg. IAS/Shunts: No atrial level shunt detected by color flow Doppler. Agitated saline contrast was given intravenously to evaluate for intracardiac shunting. Agitated saline contrast bubble study was negative, with no evidence of any interatrial shunt.  LEFT VENTRICLE PLAX 2D LVIDd:         4.80 cm     Diastology LVIDs:         3.00 cm     LV e' medial:    4.79 cm/s LV PW:         1.00 cm     LV E/e' medial:  13.8 LV IVS:        1.00 cm     LV e' lateral:   5.77 cm/s LVOT diam:  2.10 cm     LV E/e' lateral:  11.5 LV SV:         84 LV SV Index:   50 LVOT Area:     3.46 cm  LV Volumes (MOD) LV vol d, MOD A2C: 97.3 ml LV vol d, MOD A4C: 73.5 ml LV vol s, MOD A2C: 37.6 ml LV vol s, MOD A4C: 31.2 ml LV SV MOD A2C:     59.7 ml LV SV MOD A4C:     73.5 ml LV SV MOD BP:      49.4 ml RIGHT VENTRICLE             IVC RV Basal diam:  3.90 cm     IVC diam: 1.80 cm RV S prime:     13.10 cm/s TAPSE (M-mode): 2.8 cm LEFT ATRIUM             Index        RIGHT ATRIUM           Index LA diam:        2.50 cm 1.48 cm/m   RA Area:     12.50 cm LA Vol (A2C):   26.9 ml 15.97 ml/m  RA Volume:   28.60 ml  16.98 ml/m LA Vol (A4C):   29.0 ml 17.22 ml/m LA Biplane Vol: 28.3 ml 16.81 ml/m  AORTIC VALVE LVOT Vmax:   116.00 cm/s LVOT Vmean:  79.000 cm/s LVOT VTI:    0.243 m  AORTA Ao Root diam: 3.50 cm Ao Asc diam:  3.70 cm MITRAL VALVE               TRICUSPID VALVE MV Area (PHT): 1.98 cm    TR Peak grad:   14.4 mmHg MV Area VTI:   2.77 cm    TR Vmax:        190.00 cm/s MV Peak grad:  5.1 mmHg MV Mean grad:  1.0 mmHg    SHUNTS MV Vmax:       1.13 m/s    Systemic VTI:  0.24 m MV Vmean:      56.4 cm/s   Systemic Diam: 2.10 cm MV Decel Time: 383 msec MR Peak grad: 100.8 mmHg MR Mean grad: 74.0 mmHg MR Vmax:      502.00 cm/s MR Vmean:     415.0 cm/s MV E velocity: 66.20 cm/s MV A velocity: 83.10 cm/s MV E/A ratio:  0.80 Jodelle Red MD Electronically signed by Jodelle Red MD Signature Date/Time: 04/08/2023/7:43:07 PM    Final    MR BRAIN WO CONTRAST  Result Date: 04/08/2023 CLINICAL DATA:  Initial evaluation for acute neuro deficit, stroke. EXAM: MRI HEAD WITHOUT CONTRAST TECHNIQUE: Multiplanar, multiecho pulse sequences of the brain and surrounding structures were obtained without intravenous contrast. COMPARISON:  Prior CTs from earlier the same day. FINDINGS: Brain: Generalized age-related cerebral atrophy. Patchy T2/FLAIR hyperintensity involving the periventricular and deep white matter both cerebral hemispheres,  consistent with chronic small vessel ischemic disease, moderately advanced in nature. Mild patchy involvement of the pons noted. Few small remote bilateral cerebellar infarcts noted. 9 mm focus of restricted diffusion seen involving the right thalamocapsular region, consistent with a small acute ischemic infarct. No associated hemorrhage or mass effect. No other acute or subacute ischemia. Gray-white matter differentiation otherwise maintained. No acute intracranial hemorrhage. Single chronic microhemorrhage noted at the left parietal lobe. No mass lesion, midline shift or mass effect. No hydrocephalus or extra-axial fluid collection. Pituitary gland and suprasellar region within  normal limits. Vascular: Major intracranial vascular flow voids are maintained. Skull and upper cervical spine: Craniocervical junction within normal limits. Bone marrow signal intensity normal. No scalp soft tissue abnormality. Sinuses/Orbits: Globes and orbital soft tissues within normal limits. Paranasal sinuses are largely clear. No significant mastoid effusion. Other: None. IMPRESSION: 1. 9 mm acute ischemic nonhemorrhagic right thalamocapsular infarct. 2. Underlying age-related cerebral atrophy with moderate chronic microvascular ischemic disease, with a few small remote bilateral cerebellar infarcts. Electronically Signed   By: Rise Mu M.D.   On: 04/08/2023 03:53   DG Chest Port 1 View  Result Date: 04/08/2023 CLINICAL DATA:  Weakness EXAM: PORTABLE CHEST 1 VIEW COMPARISON:  07/22/2020 FINDINGS: Cardiac shadow is within normal limits. Loop recorder is now seen. Lungs are well aerated bilaterally. Diffuse fibrotic changes are noted bilaterally. A somewhat nodular density is noted in the right upper lobe incompletely evaluated on this exam. No sizable effusion is seen. No other focal abnormality is noted. IMPRESSION: Nodular density in the right upper lobe incompletely evaluated on this exam. CT of the chest would be  helpful for further evaluation. Chronic fibrotic changes. Electronically Signed   By: Alcide Clever M.D.   On: 04/08/2023 02:10   CT HEAD WO CONTRAST  Result Date: 04/08/2023 CLINICAL DATA:  Initial evaluation for neuro deficit, stroke suspected, left-sided weakness. EXAM: CT ANGIOGRAPHY HEAD AND NECK TECHNIQUE: Multidetector CT imaging of the head and neck was performed using the standard protocol during bolus administration of intravenous contrast. Multiplanar CT image reconstructions and MIPs were obtained to evaluate the vascular anatomy. Carotid stenosis measurements (when applicable) are obtained utilizing NASCET criteria, using the distal internal carotid diameter as the denominator. RADIATION DOSE REDUCTION: This exam was performed according to the departmental dose-optimization program which includes automated exposure control, adjustment of the mA and/or kV according to patient size and/or use of iterative reconstruction technique. CONTRAST:  75mL OMNIPAQUE IOHEXOL 350 MG/ML SOLN COMPARISON:  Prior study from 01/13/2021. FINDINGS: CT HEAD FINDINGS Brain: Cerebral volume within normal limits. Mild chronic microvascular ischemic disease. Small remote left cerebellar infarct. No acute intracranial hemorrhage. No acute large vessel territory infarct. No mass lesion or midline shift. No hydrocephalus or extra-axial fluid collection. Vascular: No abnormal hyperdense vessel. Calcified atherosclerosis present at skull base. Skull: Scalp soft tissues within normal limits.  Calvarium intact. Sinuses/Orbits: Globes and orbital soft tissues within normal limits. Paranasal sinuses are largely clear. No mastoid effusion. Other: None. Review of the MIP images confirms the above findings CTA NECK FINDINGS Aortic arch: Visualized aortic arch within normal limits for caliber with standard branch pattern. Aortic atherosclerosis. No significant stenosis about the origin the great vessels. Right carotid system: Right  common and internal carotid arteries are patent without dissection or stenosis. Left carotid system: Left common and internal carotid arteries are patent without dissection or stenosis. Vertebral arteries: Both vertebral arteries arise from the subclavian arteries. Atheromatous change at the origin of the right vertebral artery with moderate stenosis. Additional focal moderate stenosis involving the contralateral left V1 segment noted. Vertebral arteries otherwise patent without dissection. Skeleton: No discrete or worrisome osseous lesions. Moderate spondylosis present at C5-6 and C6-7. Other neck: No other acute finding. Upper chest: Multifocal patchy and nodular densities within the visualized lungs, nonspecific, and could be either infectious or inflammatory in nature. Review of the MIP images confirms the above findings CTA HEAD FINDINGS Anterior circulation: Mild atheromatous change about the carotid siphons without hemodynamically significant stenosis. A1 segments patent bilaterally. Normal anterior communicating artery  complex. Anterior cerebral arteries patent without stenosis. No M1 stenosis or occlusion. Distal MCA branches perfused and symmetric. Posterior circulation: Both V4 segments widely patent. Left vertebral artery slightly dominant. Both PICA patent. Basilar patent without stenosis. Superior cerebellar and posterior cerebral arteries patent bilaterally. Venous sinuses: Grossly patent allowing for timing the contrast bolus. Anatomic variants: None significant.  No aneurysm. Review of the MIP images confirms the above findings IMPRESSION: CT HEAD: 1. No acute intracranial abnormality. 2. 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 CTA HEAD AND NECK: 1. Negative CTA for large vessel occlusion or other emergent finding. 2. Moderate bilateral V1 stenoses. 3. Mild atheromatous change about the carotid bifurcations and carotid siphons without hemodynamically  significant stenosis. 4. Multifocal patchy and nodular densities within the visualized lungs, nonspecific, and could be either infectious or inflammatory in nature. Findings are incompletely assessed on this exam, and correlation with dedicated chest CT recommended. 5.  Aortic Atherosclerosis (ICD10-I70.0). Electronically Signed   By: Rise Mu M.D.   On: 04/08/2023 01:49   CT ANGIO HEAD NECK W WO CM  Result Date: 04/08/2023 CLINICAL DATA:  Initial evaluation for neuro deficit, stroke suspected, left-sided weakness. EXAM: CT ANGIOGRAPHY HEAD AND NECK TECHNIQUE: Multidetector CT imaging of the head and neck was performed using the standard protocol during bolus administration of intravenous contrast. Multiplanar CT image reconstructions and MIPs were obtained to evaluate the vascular anatomy. Carotid stenosis measurements (when applicable) are obtained utilizing NASCET criteria, using the distal internal carotid diameter as the denominator. RADIATION DOSE REDUCTION: This exam was performed according to the departmental dose-optimization program which includes automated exposure control, adjustment of the mA and/or kV according to patient size and/or use of iterative reconstruction technique. CONTRAST:  75mL OMNIPAQUE IOHEXOL 350 MG/ML SOLN COMPARISON:  Prior study from 01/13/2021. FINDINGS: CT HEAD FINDINGS Brain: Cerebral volume within normal limits. Mild chronic microvascular ischemic disease. Small remote left cerebellar infarct. No acute intracranial hemorrhage. No acute large vessel territory infarct. No mass lesion or midline shift. No hydrocephalus or extra-axial fluid collection. Vascular: No abnormal hyperdense vessel. Calcified atherosclerosis present at skull base. Skull: Scalp soft tissues within normal limits.  Calvarium intact. Sinuses/Orbits: Globes and orbital soft tissues within normal limits. Paranasal sinuses are largely clear. No mastoid effusion. Other: None. Review of the MIP  images confirms the above findings CTA NECK FINDINGS Aortic arch: Visualized aortic arch within normal limits for caliber with standard branch pattern. Aortic atherosclerosis. No significant stenosis about the origin the great vessels. Right carotid system: Right common and internal carotid arteries are patent without dissection or stenosis. Left carotid system: Left common and internal carotid arteries are patent without dissection or stenosis. Vertebral arteries: Both vertebral arteries arise from the subclavian arteries. Atheromatous change at the origin of the right vertebral artery with moderate stenosis. Additional focal moderate stenosis involving the contralateral left V1 segment noted. Vertebral arteries otherwise patent without dissection. Skeleton: No discrete or worrisome osseous lesions. Moderate spondylosis present at C5-6 and C6-7. Other neck: No other acute finding. Upper chest: Multifocal patchy and nodular densities within the visualized lungs, nonspecific, and could be either infectious or inflammatory in nature. Review of the MIP images confirms the above findings CTA HEAD FINDINGS Anterior circulation: Mild atheromatous change about the carotid siphons without hemodynamically significant stenosis. A1 segments patent bilaterally. Normal anterior communicating artery complex. Anterior cerebral arteries patent without stenosis. No M1 stenosis or occlusion. Distal MCA branches perfused and symmetric. Posterior circulation: Both V4 segments widely  patent. Left vertebral artery slightly dominant. Both PICA patent. Basilar patent without stenosis. Superior cerebellar and posterior cerebral arteries patent bilaterally. Venous sinuses: Grossly patent allowing for timing the contrast bolus. Anatomic variants: None significant.  No aneurysm. Review of the MIP images confirms the above findings IMPRESSION: CT HEAD: 1. No acute intracranial abnormality. 2. Mild chronic microvascular ischemic disease with  small remote left cerebellar infarct. CTA HEAD AND NECK: 1. Negative CTA for large vessel occlusion or other emergent finding. 2. Moderate bilateral V1 stenoses. 3. Mild atheromatous change about the carotid bifurcations and carotid siphons without hemodynamically significant stenosis. 4. Multifocal patchy and nodular densities within the visualized lungs, nonspecific, and could be either infectious or inflammatory in nature. Findings are incompletely assessed on this exam, and correlation with dedicated chest CT recommended. 5.  Aortic Atherosclerosis (ICD10-I70.0). Electronically Signed   By: Rise Mu M.D.   On: 04/08/2023 01:49      Signature  -   Susa Raring M.D on 04/11/2023 at 9:01 AM   -  To page go to [email or web address]

## 2023-04-11 NOTE — Progress Notes (Signed)
RN reporting change in neurologic exam.  Reporting patient having left arm weakness and pronator drift which was not noted previously.  Discussed with neurology and stat CT head ordered.

## 2023-04-11 NOTE — Progress Notes (Signed)
Mobility Specialist Progress Note;   04/11/23 1400  Mobility  Activity Ambulated with assistance in hallway  Level of Assistance Moderate assist, patient does 50-74%  Assistive Device Front wheel walker  Distance Ambulated (ft) 70 ft  Activity Response Tolerated well  Mobility Referral Yes  $Mobility charge 1 Mobility  Mobility Specialist Start Time (ACUTE ONLY) 1400  Mobility Specialist Stop Time (ACUTE ONLY) 1420  Mobility Specialist Time Calculation (min) (ACUTE ONLY) 20 min   Pt agreeable to mobility. Required ModA for STS and during ambulation d/t L side neglect. No c/o during session. Took 2x standing rest break d/t fatigue. Pt returned back to chair with all needs met, alarm on. Daughter in room.   Caesar Bookman Mobility Specialist Please contact via SecureChat or Rehab Office [phone]

## 2023-04-11 NOTE — H&P (Signed)
Physical Medicine and Rehabilitation Admission H&P    Chief Complaint  Patient presents with   Stroke with functional deficits    HPI:  Samuel Mata is an 89 year old male with history of HTN, COPD, Left frontoparietal stroke '22 s/p loop recorder who was admitted on 04/07/23 with reports progressive LLE weakness X 24 hours as well as gradual cognitive decline. CTA negative for LVO, moderate bilateral V1 stenosis and multifocal patchy nodular density in visualized lungs. MRI brain done revealing 9 mm acute ischemic right thalamocapsular infarct with moderate chronic microvascular ischemic disease. 2D echo showed EF 55-60% with mild MVR, negative bubble study and no wall abnormality.    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   Enlarged prostate    Hearing loss    Hypertension    Pulmonary nocardiosis (HCC) 2021   treated with 6 month antibiotic course   Renal stones    Stroke (HCC) 2022   left frontoparietal   TIA (transient ischemic attack) 2019    Past Surgical History:  Procedure Laterality Date   APPENDECTOMY     CHOLECYSTECTOMY     LOOP RECORDER INSERTION N/A 01/15/2021   Procedure: LOOP RECORDER INSERTION;  Surgeon: Hillis Range, MD;  Location: [facility];  Service: Cardiovascular;  Laterality: N/A;   NOSE SURGERY     SPINE SURGERY     compressoin fx T12, kyphoplasty   SPLENECTOMY, TOTAL      Family History  Problem Relation Age of Onset   Lung disease Neg Hx     Social History:  Retired from [employer]. Per reports that he quit smoking about 38 years ago. His smoking use included cigarettes. He started smoking about 68 years ago. He has a 30 pack-year smoking history. He has never used smokeless tobacco. He reports that he does not drink alcohol and does not use drugs.    Allergies  Allergen Reactions   Codeine     Unknown  reaction    Medications Prior to Admission  Medication Sig Dispense Refill   acetaminophen (TYLENOL) 325 MG tablet Take 650 mg by mouth as needed for headache.     aspirin EC 81 MG EC tablet Take 1 tablet (81 mg total) by mouth daily. Swallow whole. 30 tablet 11   atorvastatin (LIPITOR) 40 MG tablet Take 1 tablet (40 mg total) by mouth daily. 90 tablet 3   Calcium Carbonate Antacid (TUMS PO) Take 1 tablet by mouth daily as needed (for heartburn).     fluticasone (FLONASE) 50 MCG/ACT nasal spray Place 2 sprays into both nostrils daily.     metoprolol succinate (TOPROL XL) 25 MG 24 hr tablet Take 1 tablet (25 mg total) by mouth daily. 90 tablet 2   Naphazoline HCl (CLEAR EYES OP) Place 1 drop into both eyes at bedtime.     nitroGLYCERIN (NITROSTAT) 0.4 MG SL tablet Place 1 tablet (0.4 mg total) under the tongue every 5 (five) minutes as needed for chest pain. 25 tablet 4   sodium chloride (OCEAN) 0.65 % nasal spray Place 1 spray into the nose as needed for congestion.     tamsulosin (FLOMAX) 0.4 MG CAPS capsule Take 0.4 mg by mouth daily.      Home: Home Living Family/patient expects to be discharged to:: Private residence Living Arrangements: Spouse/significant other Available Help at Discharge: Family,  Available 24 hours/day (not able to physically help) Type of Home: House Home Access: Stairs to enter [employer] of Steps: 2 Entrance Stairs-Rails: None Home Layout: One level Bathroom Shower/Tub: Walk-in shower (half step) Firefighter: Standard Home Equipment: Information systems manager, Medical laboratory scientific officer - single point, BSC/3in1, Grab bars - tub/shower (BSC in storage) Additional Comments: wife walks with a RW, he reports his wife is using his RW. pt does more sponge baths than a shower   Functional History: Prior Function Prior Level of Function : Independent/Modified Independent Mobility Comments: ind with spc since last CVA years ago ADLs Comments: Ind in ADLs, wife completes  iADls  Functional Status:  Mobility: Bed Mobility Overal bed mobility: Needs Assistance Bed Mobility: Supine to Sit, Sit to Supine Supine to sit: Supervision Sit to supine: Supervision General bed mobility comments: supervision for safety. Transfers Overall transfer level: Needs assistance Equipment used: Rolling walker (2 wheels) Transfers: Sit to/from Stand Sit to Stand: Min assist General transfer comment: Min A for hand placement and stabilization on standing. Ambulation/Gait Ambulation/Gait assistance: Min assist Gait Distance (Feet): 50 Feet Assistive device: Rolling walker (2 wheels) Gait Pattern/deviations: Step-to pattern, Decreased step length - left, Drifts right/left General Gait Details: Pt tends to drift L due to L neglect with verbal cues for large steps with L foot, improved slightly with time. Min A for balance and staying upright with short distance gait. Gait velocity interpretation: <1.8 ft/sec, indicate of risk for recurrent falls    ADL: ADL Overall ADL's : Needs assistance/impaired Eating/Feeding: Independent, Sitting Grooming: Set up, Sitting, Wash/dry face Upper Body Bathing: Sitting, Set up Lower Body Bathing: Sitting/lateral leans, Contact guard assist Upper Body Dressing : Sitting, Set up Lower Body Dressing: Sitting/lateral leans, Supervision/safety, Set up Lower Body Dressing Details (indicate cue type and reason): donned/doffed socks, can achieve figure four but needed light assist to position LLE into figure four to don socks Toilet Transfer: Moderate assistance, Tax adviser Details (indicate cue type and reason): potential need for Max A with fatigue Toileting- Clothing Manipulation and Hygiene: Maximal assistance, Sit to/from stand  Cognition: Cognition Overall Cognitive Status: Within Functional Limits for tasks assessed Orientation Level: Oriented X4 Cognition Arousal: Alert Behavior During Therapy: WFL for tasks  assessed/performed Overall Cognitive Status: Within Functional Limits for tasks assessed General Comments: Overall WFL, HOH so uncertain if truly slow processing   Blood pressure (!) 154/73, pulse 69, temperature 98.4 F (36.9 C), resp. rate 18, height 5\' 7"  (1.702 m), weight 59 kg, SpO2 91%. Physical Exam  Results for orders placed or performed during the hospital encounter of 04/07/23 (from the past 48 hour(s))  Magnesium     Status: None   Collection Time: 04/11/23  4:27 AM  Result Value Ref Range   Magnesium 1.9 1.7 - 2.4 mg/dL    Comment: Performed at [facility], [address], [place], [place] [address]  Basic metabolic panel     Status: Abnormal   Collection Time: 04/11/23  4:27 AM  Result Value Ref Range   Sodium 131 (L) 135 - 145 mmol/L   Potassium 3.8 3.5 - 5.1 mmol/L   Chloride 100 98 - 111 mmol/L   CO2 27 22 - 32 mmol/L   Glucose, Bld 94 70 - 99 mg/dL    Comment: Glucose reference range applies only to samples taken after fasting for at least 8 hours.   BUN 15 8 - 23 mg/dL   Creatinine, Ser 6.04 0.61 - 1.24 mg/dL   Calcium  8.6 (L) 8.9 - 10.3 mg/dL   GFR, Estimated >40 >98 mL/min    Comment: (NOTE) Calculated using the CKD-EPI Creatinine Equation (2021)    Anion gap 4 (L) 5 - 15    Comment: Performed at [facility], [address], [place], [place] [address]  CBC with Differential/Platelet     Status: Abnormal   Collection Time: 04/11/23  4:27 AM  Result Value Ref Range   WBC 8.0 4.0 - 10.5 K/uL   RBC 4.42 4.22 - 5.81 MIL/uL   Hemoglobin 13.7 13.0 - 17.0 g/dL   HCT 78.2 95.6 - 21.3 %   MCV 91.6 80.0 - 100.0 fL   MCH 31.0 26.0 - 34.0 pg   MCHC 33.8 30.0 - 36.0 g/dL   RDW 08.6 57.8 - 46.9 %   Platelets 345 150 - 400 K/uL   nRBC 0.0 0.0 - 0.2 %   Neutrophils Relative % 60 %   Neutro Abs 4.8 1.7 - 7.7 K/uL   Lymphocytes Relative 17 %   Lymphs Abs 1.4 0.7 - 4.0 K/uL   Monocytes Relative 20 %   Monocytes Absolute 1.6 (H) 0.1 - 1.0 K/uL    Eosinophils Relative 2 %   Eosinophils Absolute 0.2 0.0 - 0.5 K/uL   Basophils Relative 1 %   Basophils Absolute 0.0 0.0 - 0.1 K/uL   Immature Granulocytes 0 %   Abs Immature Granulocytes 0.03 0.00 - 0.07 K/uL    Comment: Performed at [facility], [address] [place], [place] [address]   No results found.    Blood pressure (!) 154/73, pulse 69, temperature 98.4 F (36.9 C), resp. rate 18, height 5\' 7"  (1.702 m), weight 59 kg, SpO2 91%.  Medical Problem List and Plan: 1. Functional deficits secondary to ***  -patient may *** shower  -ELOS/Goals: *** 2.  Antithrombotics: -DVT/anticoagulation:  Pharmaceutical: Lovenox  -antiplatelet therapy: DAPT x 3 weeks followed by Plavix alone.  3. Pain Management: tylenol prn.  4. Mood/Behavior/Sleep: LCSW to follow for evaluation and support.   -antipsychotic agents: N/A 5. Neuropsych/cognition: This patient *** capable of making decisions on *** own behalf. 6. Skin/Wound Care: Routine pressure relief measures.  7. Fluids/Electrolytes/Nutrition: Monitor I/O. Check CMET in am   HTN: Monitor BP TID--continue OSA: Hyponatremia: COPD/Bronchiectasis/Hx of pulmonary Nocardiosis:  Dyslipidemia: LDL-98-->now on Lipitor 80 mg daily Non cardiac chest pain: Was started on metoprolol with prn NTG 11/15 by Dr. Melburn Popper.   GERD: Hereditary spherocytosis: COPD:   ***  Jacquelynn Cree, PA-C 04/11/2023

## 2023-04-11 NOTE — TOC CM/SW Note (Signed)
Transition of Care [facility]) - Inpatient Brief Assessment   Patient Details  Name: Samuel Mata MRN: [id number] Date of Birth: 1930/08/08  Transition of Care [facility]) CM/SW Contact:    Mearl Latin, LCSW Phone Number: 04/11/2023, 8:47 AM   Clinical Narrative: Patient admitted from home with spouse. CIR awaiting insurance determination. Will continue to follow.    Transition of Care Asessment: Insurance and Status: Insurance coverage has been reviewed Patient has primary care physician: Yes Home environment has been reviewed: From home Prior level of function:: Independent Prior/Current Home Services: No current home services Social Determinants of Health Reivew: SDOH reviewed no interventions necessary Readmission risk has been reviewed: Yes Transition of care needs: transition of care needs identified, TOC will continue to follow

## 2023-04-11 NOTE — Progress Notes (Signed)
Inpatient Rehab Admissions Coordinator:    CIR following. I received insurance auth but do not yet have a bed for him. I anticipate a bed for him tomorrow.  Megan Salon, MS, CCC-SLP Rehab Admissions Coordinator  [phone] (celll) [phone] (office)

## 2023-04-11 NOTE — Care Management Important Message (Signed)
Important Message  Patient Details  Name: Samuel Mata MRN: [id number] Date of Birth: 1930-11-06   Important Message Given:  Yes - Medicare IM     Dorena Bodo 04/11/2023, 3:40 PM

## 2023-04-11 NOTE — Progress Notes (Signed)
Occupational Therapy Treatment Patient Details Name: Samuel Mata MRN: [id number] DOB: 1931-01-30 Today's Date: 04/11/2023   History of present illness 89 y/o male presented to ED on 04/07/23 with left leg weakness,word finding difficulties, and dysarthria. MRI confirmed acute CVA, right thalamocapsular infarct. PMH: HTN, remote cerebellar stroke, renal stones, kyphoplasty   OT comments  Pt showered on 3 in 1. Assisted back, feet and buttocks. Set up to don shirt, total assist for donning and doffing socks. Pt with L lean with ambulation requiring moderate assistance and cues to take bigger step with L LE. Pt completed grooming with set up. Pt noted to have L flank pain with touch, RN notified. Patient will benefit from intensive inpatient follow up therapy, >3 hours/day.      If plan is discharge home, recommend the following:  A lot of help with walking and/or transfers;Assistance with cooking/housework;A lot of help with bathing/dressing/bathroom;Direct supervision/assist for medications management;Direct supervision/assist for financial management;Assist for transportation;Help with stairs or ramp for entrance   Equipment Recommendations  Other (comment) (defer)    Recommendations for Other Services      Precautions / Restrictions Precautions Precautions: Fall Restrictions Weight Bearing Restrictions: No       Mobility Bed Mobility               General bed mobility comments: in chair    Transfers Overall transfer level: Needs assistance Equipment used: Rolling walker (2 wheels) Transfers: Sit to/from Stand Sit to Stand: Min assist           General transfer comment: cues for hand placement, heavy min assist to rise and steady     Balance Overall balance assessment: Needs assistance   Sitting balance-Leahy Scale: Fair       Standing balance-Leahy Scale: Poor Standing balance comment: min to mod assist, leans L                            ADL either performed or assessed with clinical judgement   ADL Overall ADL's : Needs assistance/impaired     Grooming: Wash/dry face;Wash/dry hands;Sitting;Supervision/safety   Upper Body Bathing: Minimal assistance;Sitting   Lower Body Bathing: Minimal assistance;Sitting/lateral leans   Upper Body Dressing : Set up;Sitting   Lower Body Dressing: Total assistance;Sitting/lateral leans               Functional mobility during ADLs: Moderate assistance;Rolling walker (2 wheels) General ADL Comments: showered on [facility]    Extremity/Trunk Assessment              Vision       Perception     Praxis      Cognition Arousal: Alert Behavior During Therapy: WFL for tasks assessed/performed Overall Cognitive Status: Impaired/Different from baseline Area of Impairment: Following commands, Safety/judgement, Problem solving, Memory                     Memory: Decreased short-term memory Following Commands: Follows one step commands with increased time Safety/Judgement: Decreased awareness of safety, Decreased awareness of deficits   Problem Solving: Slow processing, Decreased initiation, Difficulty sequencing, Requires verbal cues, Requires tactile cues          Exercises      Shoulder Instructions       General Comments      Pertinent Vitals/ Pain       Pain Assessment Pain Assessment: Faces Faces Pain Scale: Hurts even more Pain Location: L flank Pain Descriptors /  Indicators: Grimacing, Guarding, Moaning Pain Intervention(s): Monitored during session, Repositioned  Home Living                                          Prior Functioning/Environment              Frequency  Min 1X/week        Progress Toward Goals  OT Goals(current goals can now be found in the care plan section)  Progress towards OT goals: Progressing toward goals  Acute Rehab OT Goals OT Goal Formulation: With family Time For Goal Achievement:  04/22/23 Potential to Achieve Goals: Good  Plan      Co-evaluation                 AM-PAC OT "6 Clicks" Daily Activity     Outcome Measure   Help from another person eating meals?: None Help from another person taking care of personal grooming?: A Little Help from another person toileting, which includes using toliet, bedpan, or urinal?: A Lot Help from another person bathing (including washing, rinsing, drying)?: A Little Help from another person to put on and taking off regular upper body clothing?: A Little Help from another person to put on and taking off regular lower body clothing?: Total 6 Click Score: 16    End of Session Equipment Utilized During Treatment: Rolling walker (2 wheels)  OT Visit Diagnosis: Unsteadiness on feet (R26.81);Other abnormalities of gait and mobility (R26.89);Hemiplegia and hemiparesis;Pain;History of falling (Z91.81) Hemiplegia - caused by: Cerebral infarction   Activity Tolerance Patient tolerated treatment well   Patient Left in chair;with call bell/phone within reach;with chair alarm set   Nurse Communication          Time: 1430-1530 OT Time Calculation (min): 60 min  Charges: OT General Charges $OT Visit: 1 Visit OT Treatments $Self Care/Home Management : 53-67 mins  Berna Spare, OTR/L Acute Rehabilitation Services Office: [phone]   Evern Bio 04/11/2023, 3:58 PM

## 2023-04-12 ENCOUNTER — Inpatient Hospital Stay (HOSPITAL_COMMUNITY)
Admission: AD | Admit: 2023-04-12 | Discharge: 2023-04-29 | DRG: 057 | Disposition: A | Discharge status: Home Health | Payer: Medicare HMO | Source: Intra-hospital | Attending: Physical Medicine & Rehabilitation | Admitting: Physical Medicine & Rehabilitation

## 2023-04-12 ENCOUNTER — Other Ambulatory Visit: Payer: Self-pay

## 2023-04-12 ENCOUNTER — Encounter (HOSPITAL_COMMUNITY): Payer: Self-pay | Admitting: Physical Medicine & Rehabilitation

## 2023-04-12 DIAGNOSIS — K219 Gastro-esophageal reflux disease without esophagitis: Secondary | ICD-10-CM | POA: Diagnosis present

## 2023-04-12 DIAGNOSIS — Z87891 Personal history of nicotine dependence: Secondary | ICD-10-CM

## 2023-04-12 DIAGNOSIS — N400 Enlarged prostate without lower urinary tract symptoms: Secondary | ICD-10-CM | POA: Diagnosis present

## 2023-04-12 DIAGNOSIS — D580 Hereditary spherocytosis: Secondary | ICD-10-CM | POA: Diagnosis present

## 2023-04-12 DIAGNOSIS — Z9081 Acquired absence of spleen: Secondary | ICD-10-CM | POA: Diagnosis not present

## 2023-04-12 DIAGNOSIS — J479 Bronchiectasis, uncomplicated: Secondary | ICD-10-CM

## 2023-04-12 DIAGNOSIS — E871 Hypo-osmolality and hyponatremia: Secondary | ICD-10-CM | POA: Diagnosis present

## 2023-04-12 DIAGNOSIS — J449 Chronic obstructive pulmonary disease, unspecified: Secondary | ICD-10-CM | POA: Diagnosis present

## 2023-04-12 DIAGNOSIS — D75839 Thrombocytosis, unspecified: Secondary | ICD-10-CM | POA: Diagnosis present

## 2023-04-12 DIAGNOSIS — I69392 Facial weakness following cerebral infarction: Principal | ICD-10-CM

## 2023-04-12 DIAGNOSIS — E785 Hyperlipidemia, unspecified: Secondary | ICD-10-CM | POA: Diagnosis present

## 2023-04-12 DIAGNOSIS — I10 Essential (primary) hypertension: Secondary | ICD-10-CM | POA: Diagnosis present

## 2023-04-12 DIAGNOSIS — G4733 Obstructive sleep apnea (adult) (pediatric): Secondary | ICD-10-CM | POA: Diagnosis present

## 2023-04-12 DIAGNOSIS — F05 Delirium due to known physiological condition: Secondary | ICD-10-CM | POA: Diagnosis not present

## 2023-04-12 DIAGNOSIS — R414 Neurologic neglect syndrome: Secondary | ICD-10-CM | POA: Diagnosis present

## 2023-04-12 DIAGNOSIS — Z79899 Other long term (current) drug therapy: Secondary | ICD-10-CM

## 2023-04-12 DIAGNOSIS — R32 Unspecified urinary incontinence: Secondary | ICD-10-CM | POA: Diagnosis present

## 2023-04-12 DIAGNOSIS — I6381 Other cerebral infarction due to occlusion or stenosis of small artery: Principal | ICD-10-CM | POA: Diagnosis present

## 2023-04-12 DIAGNOSIS — K5900 Constipation, unspecified: Secondary | ICD-10-CM | POA: Diagnosis present

## 2023-04-12 DIAGNOSIS — H9190 Unspecified hearing loss, unspecified ear: Secondary | ICD-10-CM | POA: Diagnosis present

## 2023-04-12 DIAGNOSIS — Z7902 Long term (current) use of antithrombotics/antiplatelets: Secondary | ICD-10-CM

## 2023-04-12 DIAGNOSIS — R001 Bradycardia, unspecified: Secondary | ICD-10-CM | POA: Diagnosis not present

## 2023-04-12 DIAGNOSIS — H1031 Unspecified acute conjunctivitis, right eye: Secondary | ICD-10-CM | POA: Diagnosis not present

## 2023-04-12 DIAGNOSIS — H109 Unspecified conjunctivitis: Secondary | ICD-10-CM | POA: Diagnosis present

## 2023-04-12 DIAGNOSIS — I639 Cerebral infarction, unspecified: Secondary | ICD-10-CM | POA: Diagnosis not present

## 2023-04-12 MED ORDER — METOPROLOL TARTRATE 25 MG PO TABS
25.0000 mg | ORAL_TABLET | Freq: Two times a day (BID) | ORAL | Status: DC
  Administered 2023-04-12 – 2023-04-16 (×8): 25 mg via ORAL
  Filled 2023-04-12 (×8): qty 1

## 2023-04-12 MED ORDER — METOPROLOL TARTRATE 25 MG PO TABS
25.0000 mg | ORAL_TABLET | Freq: Two times a day (BID) | ORAL | Status: DC
  Administered 2023-04-12: 25 mg via ORAL
  Filled 2023-04-12: qty 1

## 2023-04-12 MED ORDER — PROCHLORPERAZINE 25 MG RE SUPP: 12.5000 mg | Freq: Four times a day (QID) | RECTAL | Status: DC | PRN

## 2023-04-12 MED ORDER — FLUTICASONE PROPIONATE 50 MCG/ACT NA SUSP
2.0000 | Freq: Every day | NASAL | Status: DC
  Administered 2023-04-14 – 2023-04-28 (×12): 2 via NASAL
  Filled 2023-04-12: qty 16

## 2023-04-12 MED ORDER — CLOPIDOGREL BISULFATE 75 MG PO TABS
75.0000 mg | ORAL_TABLET | Freq: Every day | ORAL | Status: DC
  Administered 2023-04-13 – 2023-04-29 (×17): 75 mg via ORAL
  Filled 2023-04-12 (×17): qty 1

## 2023-04-12 MED ORDER — ROSUVASTATIN CALCIUM 20 MG PO TABS: 20.0000 mg | ORAL_TABLET | Freq: Every day | ORAL | Status: DC

## 2023-04-12 MED ORDER — GUAIFENESIN-DM 100-10 MG/5ML PO SYRP: 5.0000 mL | ORAL_SOLUTION | Freq: Four times a day (QID) | ORAL | Status: DC | PRN

## 2023-04-12 MED ORDER — ENOXAPARIN SODIUM 40 MG/0.4ML IJ SOSY
40.0000 mg | PREFILLED_SYRINGE | INTRAMUSCULAR | Status: DC
  Administered 2023-04-12 – 2023-04-28 (×17): 40 mg via SUBCUTANEOUS
  Filled 2023-04-12 (×17): qty 0.4

## 2023-04-12 MED ORDER — METOPROLOL TARTRATE 25 MG PO TABS: 25.0000 mg | ORAL_TABLET | Freq: Two times a day (BID) | ORAL | Status: DC

## 2023-04-12 MED ORDER — TAMSULOSIN HCL 0.4 MG PO CAPS
0.4000 mg | ORAL_CAPSULE | Freq: Every day | ORAL | Status: DC
  Administered 2023-04-13 – 2023-04-28 (×16): 0.4 mg via ORAL
  Filled 2023-04-12 (×16): qty 1

## 2023-04-12 MED ORDER — PROCHLORPERAZINE MALEATE 5 MG PO TABS
5.0000 mg | ORAL_TABLET | Freq: Four times a day (QID) | ORAL | Status: DC | PRN
  Administered 2023-04-15: 10 mg via ORAL
  Filled 2023-04-12: qty 2

## 2023-04-12 MED ORDER — MELATONIN 5 MG PO TABS
5.0000 mg | ORAL_TABLET | Freq: Every evening | ORAL | Status: DC | PRN
  Administered 2023-04-14 – 2023-04-17 (×3): 5 mg via ORAL
  Filled 2023-04-12 (×3): qty 1

## 2023-04-12 MED ORDER — ROSUVASTATIN CALCIUM 20 MG PO TABS
20.0000 mg | ORAL_TABLET | Freq: Every day | ORAL | Status: DC
  Administered 2023-04-13 – 2023-04-28 (×16): 20 mg via ORAL
  Filled 2023-04-12 (×2): qty 1
  Filled 2023-04-12: qty 4
  Filled 2023-04-12 (×13): qty 1

## 2023-04-12 MED ORDER — ACETAMINOPHEN 325 MG PO TABS: 325.0000 mg | ORAL_TABLET | ORAL | Status: DC | PRN

## 2023-04-12 MED ORDER — PROCHLORPERAZINE EDISYLATE 10 MG/2ML IJ SOLN: 5.0000 mg | Freq: Four times a day (QID) | INTRAMUSCULAR | Status: DC | PRN

## 2023-04-12 MED ORDER — DIPHENHYDRAMINE HCL 25 MG PO CAPS: 25.0000 mg | ORAL_CAPSULE | Freq: Four times a day (QID) | ORAL | Status: DC | PRN

## 2023-04-12 MED ORDER — NITROGLYCERIN 0.4 MG SL SUBL: 0.4000 mg | SUBLINGUAL_TABLET | SUBLINGUAL | Status: DC | PRN

## 2023-04-12 MED ORDER — BISACODYL 10 MG RE SUPP: 10.0000 mg | Freq: Every day | RECTAL | Status: DC | PRN

## 2023-04-12 MED ORDER — FAMOTIDINE 20 MG PO TABS: 20.0000 mg | ORAL_TABLET | Freq: Every day | ORAL | Status: DC

## 2023-04-12 MED ORDER — FAMOTIDINE 20 MG PO TABS
20.0000 mg | ORAL_TABLET | Freq: Every day | ORAL | Status: DC
  Administered 2023-04-13 – 2023-04-29 (×17): 20 mg via ORAL
  Filled 2023-04-12 (×17): qty 1

## 2023-04-12 MED ORDER — FLEET ENEMA RE ENEM: 1.0000 | ENEMA | Freq: Once | RECTAL | Status: DC | PRN

## 2023-04-12 MED ORDER — CLOPIDOGREL BISULFATE 75 MG PO TABS: 75.0000 mg | ORAL_TABLET | Freq: Every day | ORAL | Status: DC

## 2023-04-12 MED ORDER — ALUM & MAG HYDROXIDE-SIMETH 200-200-20 MG/5ML PO SUSP
30.0000 mL | ORAL | Status: DC | PRN
  Filled 2023-04-12: qty 30

## 2023-04-12 MED ORDER — ASPIRIN 81 MG PO TBEC
81.0000 mg | DELAYED_RELEASE_TABLET | Freq: Every day | ORAL | Status: AC
  Administered 2023-04-13 – 2023-04-28 (×16): 81 mg via ORAL
  Filled 2023-04-12 (×16): qty 1

## 2023-04-12 NOTE — H&P (Signed)
Physical Medicine and Rehabilitation Admission H&P        Chief Complaint  Patient presents with   Stroke with functional deficits      HPI:  Samuel Mata is an 89 year old R handed male with history of HTN, COPD, Left frontoparietal stroke '22 s/p loop recorder who was admitted on 04/07/23 with reports progressive LLE weakness X 24 hours as well as gradual cognitive decline. CTA negative for LVO, moderate bilateral V1 stenosis and multifocal patchy nodular density in visualized lungs. MRI brain done revealing 9 mm acute ischemic right thalamocapsular infarct with moderate chronic microvascular ischemic disease. 2D echo showed EF 55-60% with mild MVR, negative bubble study and no wall abnormality. Loop recorder without A fib.  Dr. Pearlean Brownie felt that stroke was due to small vessel disease and recommended DAPT X 3 weeks followed by Plavix alone.  Yesterday evening, patient with LUE weakness/pronator drift and CT head repeated showing evolution of stroke and negative for bleed.  Therapy has been working with patient who is limited by mild left sided neglect, left lean, left flank pain, balance deficits and flexed posture. He has been requiring min assist overall and today was requiring mod assist with mobility specialist. CIR recommended due to functional decline.    Pt reports had a rash 1 month ago on arms- but better- daughter said was due to dry skin, not  a rash. Was burning initially per pt. Urinating with condom catheter.  LBM 2 days ago- not eating much.  Denies pain.    Review of Systems  Constitutional:  Negative for chills and fever.  Eyes:  Negative for blurred vision and double vision.  Respiratory:  Positive for cough and shortness of breath (with exertion).   Cardiovascular:  Negative for chest pain and palpitations.  Gastrointestinal:  Negative for constipation.  Genitourinary:  Negative for dysuria.  Musculoskeletal:  Negative for back pain, joint pain and myalgias.   Neurological:  Positive for speech change and focal weakness.  Psychiatric/Behavioral:  The patient does not have insomnia.   All other systems reviewed and are negative.           Past Medical History:  Diagnosis Date   Bronchiectasis without complication (HCC)     Enlarged prostate     Hearing loss     Hypertension     Pulmonary nocardiosis (HCC) 2021    treated with 6 month antibiotic course   Renal stones     Stroke (HCC) 2022    left frontoparietal   TIA (transient ischemic attack) 2019               Past Surgical History:  Procedure Laterality Date   APPENDECTOMY       CHOLECYSTECTOMY       LOOP RECORDER INSERTION N/A 01/15/2021    Procedure: LOOP RECORDER INSERTION;  Surgeon: Hillis Range, MD;  Location: [facility];  Service: Cardiovascular;  Laterality: N/A;   NOSE SURGERY       SPINE SURGERY        compressoin fx T12, kyphoplasty   SPLENECTOMY, TOTAL                   Family History  Problem Relation Age of Onset   Lung disease Neg Hx            Social History:  Married. Retired from [employer]. Per reports that he quit smoking about 38 years ago. His smoking use  included cigarettes. He started smoking about 68 years ago. He has a 30 pack-year smoking history. He has never used smokeless tobacco. He reports that he does not drink alcohol and does not use drugs.     Allergies       Allergies  Allergen Reactions   Codeine        Unknown reaction              Medications Prior to Admission  Medication Sig Dispense Refill   acetaminophen (TYLENOL) 325 MG tablet Take 650 mg by mouth as needed for headache.       aspirin EC 81 MG EC tablet Take 1 tablet (81 mg total) by mouth daily. Swallow whole. 30 tablet 11   atorvastatin (LIPITOR) 40 MG tablet Take 1 tablet (40 mg total) by mouth daily. 90 tablet 3   Calcium Carbonate Antacid (TUMS PO) Take 1 tablet by mouth daily as needed (for heartburn).       fluticasone (FLONASE) 50  MCG/ACT nasal spray Place 2 sprays into both nostrils daily.       metoprolol succinate (TOPROL XL) 25 MG 24 hr tablet Take 1 tablet (25 mg total) by mouth daily. 90 tablet 2   Naphazoline HCl (CLEAR EYES OP) Place 1 drop into both eyes at bedtime.       nitroGLYCERIN (NITROSTAT) 0.4 MG SL tablet Place 1 tablet (0.4 mg total) under the tongue every 5 (five) minutes as needed for chest pain. 25 tablet 4   sodium chloride (OCEAN) 0.65 % nasal spray Place 1 spray into the nose as needed for congestion.       tamsulosin (FLOMAX) 0.4 MG CAPS capsule Take 0.4 mg by mouth daily.              Home: Home Living Family/patient expects to be discharged to:: Private residence Living Arrangements: Spouse/significant other Available Help at Discharge: Family, Available 24 hours/day (not able to physically help) Type of Home: House Home Access: Stairs to enter [employer] of Steps: 2 Entrance Stairs-Rails: None Home Layout: One level Bathroom Shower/Tub: Walk-in shower (half step) Firefighter: Pharmacist, community: Yes Home Equipment: Information systems manager, Medical laboratory scientific officer - single point, BSC/3in1, Grab bars - tub/shower (BSC in storage) Additional Comments: wife walks with a RW, he reports his wife is using his RW. pt does more sponge baths than a shower   Functional History: Prior Function Prior Level of Function : Independent/Modified Independent Mobility Comments: ind with spc since last CVA years ago ADLs Comments: Ind in ADLs, wife completes iADls   Functional Status:  Mobility: Bed Mobility Overal bed mobility: Needs Assistance Bed Mobility: Supine to Sit, Sit to Supine Supine to sit: Supervision Sit to supine: Supervision General bed mobility comments: in chair Transfers Overall transfer level: Needs assistance Equipment used: Rolling walker (2 wheels) Transfers: Sit to/from Stand Sit to Stand: Min assist General transfer comment: cues for hand placement, heavy min assist  to rise and steady Ambulation/Gait Ambulation/Gait assistance: Min assist Gait Distance (Feet): 50 Feet Assistive device: Rolling walker (2 wheels) Gait Pattern/deviations: Step-to pattern, Decreased step length - left, Drifts right/left General Gait Details: Pt tends to drift L due to L neglect with verbal cues for large steps with L foot, improved slightly with time. Min A for balance and staying upright with short distance gait. Gait velocity interpretation: <1.8 ft/sec, indicate of risk for recurrent falls   ADL: ADL Overall ADL's : Needs assistance/impaired Eating/Feeding: Independent, Sitting Grooming: Wash/dry face, Wash/dry hands,  Sitting, Supervision/safety Upper Body Bathing: Minimal assistance, Sitting Lower Body Bathing: Minimal assistance, Sitting/lateral leans Upper Body Dressing : Set up, Sitting Lower Body Dressing: Total assistance, Sitting/lateral leans Lower Body Dressing Details (indicate cue type and reason): donned/doffed socks, can achieve figure four but needed light assist to position LLE into figure four to don socks Toilet Transfer: Moderate assistance, Tax adviser Details (indicate cue type and reason): potential need for Max A with fatigue Toileting- Clothing Manipulation and Hygiene: Maximal assistance, Sit to/from stand Functional mobility during ADLs: Moderate assistance, Rolling walker (2 wheels) General ADL Comments: showered on BSC   Cognition: Cognition Overall Cognitive Status: Impaired/Different from baseline Orientation Level: Oriented X4 Cognition Arousal: Alert Behavior During Therapy: WFL for tasks assessed/performed Overall Cognitive Status: Impaired/Different from baseline Area of Impairment: Following commands, Safety/judgement, Problem solving, Memory Memory: Decreased short-term memory Following Commands: Follows one step commands with increased time Safety/Judgement: Decreased awareness of safety, Decreased awareness  of deficits Problem Solving: Slow processing, Decreased initiation, Difficulty sequencing, Requires verbal cues, Requires tactile cues General Comments: Overall WFL, HOH so uncertain if truly slow processing     Blood pressure (!) 147/88, pulse 67, temperature 98.6 F (37 C), temperature source Oral, resp. rate 16, height 5\' 7"  (1.702 m), weight 59 kg, SpO2 92%. Physical Exam Vitals and nursing note reviewed. Exam conducted with a chaperone present.  Constitutional:      Comments: Thin, frail appearing elderly male; sitting up in bed; daughter at bedside; awake, alert, appropriate, NAD  HENT:     Head: Normocephalic and atraumatic.     Comments: Tongue midline Mild L  facial droop with smile equal when smiles Facial sensation intact to light touch      Right Ear: External ear normal.     Left Ear: External ear normal.     Nose: Nose normal. No congestion.     Mouth/Throat:     Mouth: Mucous membranes are dry.     Pharynx: Oropharynx is clear. No oropharyngeal exudate.  Eyes:     General:        Right eye: No discharge.        Left eye: No discharge.     Extraocular Movements: Extraocular movements intact.     Comments: Wearing eyeglasses EOMI B/L Mild dysconjugate gaze at rest- looks left at rest as well- L gaze preference  Cardiovascular:     Rate and Rhythm: Normal rate and regular rhythm.     Heart sounds: Normal heart sounds. No murmur heard.    No gallop.  Pulmonary:     Effort: Pulmonary effort is normal. No respiratory distress.     Breath sounds: Normal breath sounds. No wheezing, rhonchi or rales.  Abdominal:     General: Bowel sounds are normal. There is no distension.     Palpations: Abdomen is soft.     Tenderness: There is no abdominal tenderness.  Genitourinary:    Comments: Condom catheter in place with medium amber urine Musculoskeletal:     Cervical back: Neck supple. No tenderness.     Comments: RUE 5/5 in biceps, triceps, WE< grip and FA LUE-  proximally 4/5 and distally WE, grip and FA 4+/5 RLE- 5/5 in HF, KE, KF, DF and PF LLE- HF 4/5 otherwise 4+/5  Skin:    General: Skin is warm and dry.     Comments: Very dry flaky skin on arms Some ecchymoses on arms as well No IV seen   Neurological:     Mental  Status: He is alert and oriented to person, place, and time.     Comments: Decreased hearing and needed occasional redirection. Left facial weakness with exophthalmus and moderate dysarthria. Able to recall date and follow simple one step commands. Ataxia with LUE.  Following commands- mild L inattention Decreased to light touch in LUE and LLE No hoffman's or Clonus or increased tone on L side VERY HOH- not wearing hearing aids  Psychiatric:        Mood and Affect: Mood normal.        Behavior: Behavior normal.        Lab Results Last 48 Hours        Results for orders placed or performed during the hospital encounter of 04/07/23 (from the past 48 hour(s))  Magnesium     Status: None    Collection Time: 04/11/23  4:27 AM  Result Value Ref Range    Magnesium 1.9 1.7 - 2.4 mg/dL      Comment: Performed at [facility], [address] [place], [place] [address]  Basic metabolic panel     Status: Abnormal    Collection Time: 04/11/23  4:27 AM  Result Value Ref Range    Sodium 131 (L) 135 - 145 mmol/L    Potassium 3.8 3.5 - 5.1 mmol/L    Chloride 100 98 - 111 mmol/L    CO2 27 22 - 32 mmol/L    Glucose, Bld 94 70 - 99 mg/dL      Comment: Glucose reference range applies only to samples taken after fasting for at least 8 hours.    BUN 15 8 - 23 mg/dL    Creatinine, Ser 1.47 0.61 - 1.24 mg/dL    Calcium 8.6 (L) 8.9 - 10.3 mg/dL    GFR, Estimated >82 >95 mL/min      Comment: (NOTE) Calculated using the CKD-EPI Creatinine Equation (2021)      Anion gap 4 (L) 5 - 15      Comment: Performed at [facility], [address] [place], [place] [address]  CBC with Differential/Platelet     Status: Abnormal     Collection Time: 04/11/23  4:27 AM  Result Value Ref Range    WBC 8.0 4.0 - 10.5 K/uL    RBC 4.42 4.22 - 5.81 MIL/uL    Hemoglobin 13.7 13.0 - 17.0 g/dL    HCT 86.5 78.4 - 69.6 %    MCV 91.6 80.0 - 100.0 fL    MCH 31.0 26.0 - 34.0 pg    MCHC 33.8 30.0 - 36.0 g/dL    RDW 29.5 28.4 - 13.2 %    Platelets 345 150 - 400 K/uL    nRBC 0.0 0.0 - 0.2 %    Neutrophils Relative % 60 %    Neutro Abs 4.8 1.7 - 7.7 K/uL    Lymphocytes Relative 17 %    Lymphs Abs 1.4 0.7 - 4.0 K/uL    Monocytes Relative 20 %    Monocytes Absolute 1.6 (H) 0.1 - 1.0 K/uL    Eosinophils Relative 2 %    Eosinophils Absolute 0.2 0.0 - 0.5 K/uL    Basophils Relative 1 %    Basophils Absolute 0.0 0.0 - 0.1 K/uL    Immature Granulocytes 0 %    Abs Immature Granulocytes 0.03 0.00 - 0.07 K/uL      Comment: Performed at [facility], [address] [place], [place] [address]  Imaging Results (Last 48 hours)  CT HEAD WO CONTRAST ( )   Result Date: 04/12/2023 CLINICAL DATA:  Follow-up examination for stroke. EXAM: CT HEAD WITHOUT CONTRAST TECHNIQUE: Contiguous axial images were obtained from the base of the skull through the vertex without intravenous contrast. RADIATION DOSE REDUCTION: This exam was performed according to the departmental dose-optimization program which includes automated exposure control, adjustment of the mA and/or kV according to patient size and/or use of iterative reconstruction technique. COMPARISON:  Prior MRI and CT from 04/08/2023. FINDINGS: Brain: Generalized age-related cerebral atrophy with chronic small vessel ischemic disease. Small remote left cerebellar infarct noted. Previously identified right thalamic infarct again noted, relatively stable in size and distribution from prior. No significant regional mass effect. No hemorrhagic transformation. No other acute cortically based infarct. No acute intracranial hemorrhage. No mass lesion or midline shift. No hydrocephalus or  extra-axial fluid collection. Vascular: No abnormal hyperdense vessel. Calcified atherosclerosis present at the skull base. Focal calcified plaque at the right M1 segment noted, stable. Skull: Scalp soft tissues and calvarium demonstrate no new finding. Sinuses/Orbits: Globes and orbital soft tissues within normal limits. Paranasal sinuses and mastoid air cells are largely clear. Other: None. IMPRESSION: 1. Normal expected interval evolution of previously identified right thalamic infarct. No hemorrhagic transformation or significant regional mass effect. 2. No other new acute intracranial abnormality. 3. Underlying age-related cerebral atrophy with chronic small vessel ischemic disease. Small remote left cerebellar infarct. Electronically Signed   By: Rise Mu M.D.   On: 04/12/2023 02:09    DG Ribs Bilateral   Result Date: 04/11/2023 CLINICAL DATA:  Pain after fall EXAM: BILATERAL RIBS - 7 VIEW COMPARISON:  Chest x-ray 04/07/2023. FINDINGS: Osteopenia. No displaced rib fracture is clearly identified. As seen on the prior x-ray there is hyperinflation with diffuse interstitial changes and slightly confluence nodular opacity overlying the right upper lung. Loop recorder. Degenerative changes of the spine. Compression deformity along the lower thoracic region with augmentation cement IMPRESSION: Osteopenia. No displaced rib fracture. No pneumothorax or effusion on these rib x-rays. Please correlate with the prior chest x-ray Electronically Signed   By: Karen Kays M.D.   On: 04/11/2023 17:03           Blood pressure (!) 147/88, pulse 67, temperature 98.6 F (37 C), temperature source Oral, resp. rate 16, height 5\' 7"  (1.702 m), weight 59 kg, SpO2 92%.   Medical Problem List and Plan: 1. Functional deficits secondary to R thalamic/capsular infarct             -patient may  shower             -ELOS/Goals: 7-10 days- supervision             Admit to CIR 2.   Antithrombotics: -DVT/anticoagulation:  Pharmaceutical: Lovenox             -antiplatelet therapy: DAPT x 3 weeks followed by Plavix alone.  3. Pain Management: tylenol prn.  4. Mood/Behavior/Sleep: LCSW to follow for evaluation and support.              -antipsychotic agents: N/A 5. Neuropsych/cognition: This patient is capable of making decisions on his own behalf, however is very HOH 6. Skin/Wound Care: Routine pressure relief measures.  7. Fluids/Electrolytes/Nutrition: Monitor I/O. Check CMET in am  8. HTN: Monitor BP TID--metoprolol resumed on 11/26.   9. 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 Dyslipidemia: LDL-98-->now on Lipitor 80 mg daily  12. Non cardiac chest pain: Was started on metoprolol with prn NTG 11/15 by Dr. Melburn Popper.   13. GERD: Continue Pepcid. 14. 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  15. OSA?: Sleep study recommended at discharge.         Jacquelynn Cree, PA-C 04/12/2023 I have personally performed a face to face diagnostic evaluation of this patient and formulated the key components of the plan.  Additionally, I have personally reviewed laboratory data, imaging studies, as well as relevant notes and concur with the physician assistant's documentation above.   The patient's status has not changed from the original H&P.  Any changes in documentation from the acute care chart have been noted above.

## 2023-04-12 NOTE — Progress Notes (Signed)
PMR Admission Coordinator Pre-Admission Assessment   Patient: Samuel Mata is an 89 y.o., male MRN: [id number] DOB: 01-08-1931 Height: 5\' 7"  (170.2 cm) Weight: 59 kg   Insurance Information HMO: PPO: yes      PCP:      IPA:      80/20:      OTHER:  PRIMARY: [employer]      Policy#: [id number], group: [id number] [place]      Subscriber: Pt CM Name: Samuel Mata      Phone#: [phone]     Fax#: [phone] Pre-Cert#: [id number]      Employer: Samuel Mata with [employer] called with authorization on 11/25 for admit 11/25-12/1  Benefits:  Phone #:      Name:  Dolores Hoose Date: 05/17/2022- still active Deductible: does not have OOP Max: $4,500 ($375.67 met) CIR: $295/day co-pay with a max co-pay of $1,770/admission (6 days) SNF: $0/day co-pay for days 1-20, $203/day co-pay for days 21-100; limited to 100 days/cal yr. Outpatient:  $20 copay/visit Home Health:  100% coverage DME: 80% coverage; 20% co-insurance Providers: in network    SECONDARY:       Policy#:      Phone#:      Artist:       Phone#:    The Data processing manager" for patients in Inpatient Rehabilitation Facilities with attached "Privacy Act Statement-Health Care Records" was provided and verbally reviewed with: Patient   Emergency Contact Information Contact Information       Name Relation Home Work Mobile    [facility] The Spouse [phone]   [phone]    Samuel Mata Daughter     [phone]         Other Contacts   None on File        Current Medical History  Patient Admitting Diagnosis: CVA History of Present Illness: Samuel Mata is a 89 y.o. male with medical history significant of COPD, hypertension, GERD, hereditary spherocytosis, sleep apnea who presented to the [facility] Emergency Department on 04/07/22  with left leg weakness.  CT of the head showed no acute intracranial abnormality. Small remote cerebellar infarct.    CTA head & neck showed no LVO and Moderate  bilateral V1 stenosis.  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 2D Echo with EF 55 to 60%, normal left atrial size, no interatrial shunt. Loop recorder in place, negative for A-fib on last interrogation 04/03/2023. LDL 98, HgbA1c 5.4, UDS negative.  For VTE prophylaxis, Pt. on Heparin subcutaneous. Pt. Was on aspirin 81 mg daily prior to admission, now on aspirin 81 mg daily and clopidogrel 75 mg daily for 3 weeks and then Plavix alone. Pt. Seen by PT/OT/SLP and they recommend CIR to assist return to PLOF. Complete NIHSS TOTAL: 0   Patient's medical record from [facility] has been reviewed by the rehabilitation admission coordinator and physician.   Past Medical History      Past Medical History:  Diagnosis Date   Bronchiectasis without complication (HCC)     Enlarged prostate     Hearing loss     Hypertension     Pulmonary nocardiosis (HCC) 2021    treated with 6 month antibiotic course   Renal stones     Stroke (HCC) 2022    left frontoparietal   TIA (transient ischemic attack) 2019          Has the patient had major surgery during 100 days prior to admission? No  Family History   family history is not on file.   Current Medications  Current Medications    Current Facility-Administered Medications:    aspirin EC tablet 81 mg, 81 mg, Oral, Daily, Samuel Brock, MD, 81 mg at 04/11/23 0854   clopidogrel (PLAVIX) tablet 75 mg, 75 mg, Oral, Daily, Samuel Brock, MD, 75 mg at 04/11/23 0854   famotidine (PEPCID) tablet 20 mg, 20 mg, Oral, Daily, Samuel Raring K, MD, 20 mg at 04/11/23 0854   fluticasone (FLONASE) 50 MCG/ACT nasal spray 2 spray, 2 spray, Each Nare, Daily, Samuel Sea, MD, 2 spray at 04/11/23 0855   heparin injection 5,000 Units, 5,000 Units, Subcutaneous, Q8H, Samuel Fallen, MD, 5,000 Units at 04/11/23 0650   nitroGLYCERIN (NITROSTAT) SL tablet 0.4 mg, 0.4 mg,  Sublingual, Q5 min PRN, Samuel Sea, MD   rosuvastatin (CRESTOR) tablet 20 mg, 20 mg, Oral, Daily, Samuel Sea, MD, 20 mg at 04/11/23 0854   tamsulosin (FLOMAX) capsule 0.4 mg, 0.4 mg, Oral, Daily, Samuel Sea, MD, 0.4 mg at 04/11/23 0102     Patients Current Diet:  Diet Order                  Diet Heart Room service appropriate? Yes; Fluid consistency: Thin  Diet effective now                         Precautions / Restrictions Precautions Precautions: Fall Restrictions Weight Bearing Restrictions: No    Has the patient had 2 or more falls or a fall with injury in the past year? No   Prior Activity Level Community (5-7x/wk): Pt. active in the community PTA   Prior Functional Level Self Care: Did the patient need help bathing, dressing, using the toilet or eating? Independent   Indoor Mobility: Did the patient need assistance with walking from room to room (with or without device)? Independent   Stairs: Did the patient need assistance with internal or external stairs (with or without device)? Independent   Functional Cognition: Did the patient need help planning regular tasks such as shopping or remembering to take medications? Independent   Patient Information Are you of Hispanic, Latino/a,or Spanish origin?: A. No, not of Hispanic, Latino/a, or Spanish origin What is your race?: A. White Do you need or want an interpreter to communicate with a doctor or health care staff?: 0. No   Patient's Response To:  Health Literacy and Transportation Is the patient able to respond to health literacy and transportation needs?: Yes Health Literacy - How often do you need to have someone help you when you read instructions, pamphlets, or other written material from your doctor or pharmacy?: Never In the past 12 months, has lack of transportation kept you from medical appointments or from getting medications?: No In the past 12 months, has lack of transportation  kept you from meetings, work, or from getting things needed for daily living?: No   Journalist, newspaper / Equipment Home Equipment: Shower seat, Medical laboratory scientific officer - single point, BSC/3in1, Grab bars - tub/shower (BSC in storage)   Prior Device Use: Indicate devices/aids used by the patient prior to current illness, exacerbation or injury? None of the above   Current Functional Level Cognition   Overall Cognitive Status: Within Functional Limits for tasks assessed Orientation Level: Oriented X4 General Comments: Overall WFL, HOH so uncertain if truly slow processing    Extremity Assessment (includes Sensation/Coordination)   Upper Extremity  Assessment: Defer to OT evaluation  Lower Extremity Assessment: Generalized weakness     ADLs   Overall ADL's : Needs assistance/impaired Eating/Feeding: Independent, Sitting Grooming: Set up, Sitting, Wash/dry face Upper Body Bathing: Sitting, Set up Lower Body Bathing: Sitting/lateral leans, Contact guard assist Upper Body Dressing : Sitting, Set up Lower Body Dressing: Sitting/lateral leans, Supervision/safety, Set up Lower Body Dressing Details (indicate cue type and reason): donned/doffed socks, can achieve figure four but needed light assist to position LLE into figure four to don socks Toilet Transfer: Moderate assistance, Tax adviser Details (indicate cue type and reason): potential need for Max A with fatigue Toileting- Clothing Manipulation and Hygiene: Maximal assistance, Sit to/from stand     Mobility   Overal bed mobility: Needs Assistance Bed Mobility: Supine to Sit, Sit to Supine Supine to sit: Supervision Sit to supine: Supervision General bed mobility comments: supervision for safety.     Transfers   Overall transfer level: Needs assistance Equipment used: Rolling walker (2 wheels) Transfers: Sit to/from Stand Sit to Stand: Min assist General transfer comment: Min A for hand placement and stabilization on standing.      Ambulation / Gait / Stairs / Wheelchair Mobility   Ambulation/Gait Ambulation/Gait assistance: Editor, commissioning (Feet): 50 Feet Assistive device: Rolling walker (2 wheels) Gait Pattern/deviations: Step-to pattern, Decreased step length - left, Drifts right/left General Gait Details: Pt tends to drift L due to L neglect with verbal cues for large steps with L foot, improved slightly with time. Min A for balance and staying upright with short distance gait. Gait velocity interpretation: <1.8 ft/sec, indicate of risk for recurrent falls     Posture / Balance Dynamic Sitting Balance Sitting balance - Comments: up in recliner Balance Overall balance assessment: Needs assistance Sitting-balance support: No upper extremity supported, Feet supported Sitting balance-Leahy Scale: Fair Sitting balance - Comments: up in recliner Postural control: Left lateral lean (in standing) Standing balance support: Reliant on assistive device for balance, Bilateral upper extremity supported Standing balance-Leahy Scale: Poor Standing balance comment: Min A for balance in standing.     Special needs/care consideration Skin intact    Previous Home Environment (from acute therapy documentation) Living Arrangements: Spouse/significant other Available Help at Discharge: Family, Available 24 hours/day (not able to physically help) Type of Home: House Home Layout: One level Home Access: Stairs to enter Entrance Stairs-Rails: None Entrance Stairs-Number of Steps: 2 Bathroom Shower/Tub: Walk-in shower (half step) Firefighter: Standard Bathroom Accessibility: Yes How Accessible: Accessible via walker Home Care Services: No Additional Comments: wife walks with a RW, he reports his wife is using his RW. pt does more sponge baths than a shower   Discharge Living Setting Plans for Discharge Living Setting: Patient's home Type of Home at Discharge: House Discharge Home Layout: One  level Discharge Home Access: Stairs to enter Entrance Stairs-Rails: None Entrance Stairs-Number of Steps: 2 Discharge Bathroom Shower/Tub: Walk-in shower Discharge Bathroom Toilet: Standard Discharge Bathroom Accessibility: No Does the patient have any problems obtaining your medications?: No   Social/Family/Support Systems Patient Roles: Spouse Contact Information: [phone] Anticipated Caregiver: Natasha Mead Anticipated Caregiver's Contact Information: 24/7 Ability/Limitations of Caregiver: Supervision only Caregiver Availability: 24/7 Discharge Plan Discussed with Primary Caregiver: Yes Is Caregiver In Agreement with Plan?: Yes Does Caregiver/Family have Issues with Lodging/Transportation while Pt is in Rehab?: No   Goals Patient/Family Goal for Rehab: PT/OT/SLP Supervision level Expected length of stay: 7-10 days Pt/Family Agrees to Admission and willing to participate: Yes Program Orientation Provided &  Reviewed with Pt/Caregiver Including Roles  & Responsibilities: Yes   Decrease burden of Care through IP rehab admission: not anticipated   Possible need for SNF placement upon discharge: not anticipated   Patient Condition: I have reviewed medical records from [facility], spoken with CM, and spouse and daughter. I met with patient at the bedside for inpatient rehabilitation assessment.  Patient will benefit from ongoing PT, OT, and SLP, can actively participate in 3 hours of therapy a day 5 days of the week, and can make measurable gains during the admission.  Patient will also benefit from the coordinated team approach during an Inpatient Acute Rehabilitation admission.  The patient will receive intensive therapy as well as Rehabilitation physician, nursing, social worker, and care management interventions.  Due to safety, skin/wound care, disease management, medication administration, pain management, and patient education the patient requires 24 hour a  day rehabilitation nursing.  The patient is currently Min A with mobility and basic ADLs.  Discharge setting and therapy post discharge at home with home health is anticipated.  Patient has agreed to participate in the Acute Inpatient Rehabilitation Program and will admit today.   Preadmission Screen Completed By:  Jeronimo Greaves, 04/11/2023 2:05 PM ______________________________________________________________________   Discussed status with Dr. Berline Chough  on 04/12/23 at 930 and received approval for admission today.   Admission Coordinator:  Jeronimo Greaves, CCC-SLP, time 1015/Date 04/12/23    Assessment/Plan: Diagnosis: R thalamocapusular infract Does the need for close, 24 hr/day Medical supervision in concert with the patient's rehab needs make it unreasonable for this patient to be served in a less intensive setting? Yes Co-Morbidities requiring supervision/potential complications: HTN; COPD; OSA; remote cerebellar stroke; kyphoplasty; GERD Due to bladder management, bowel management, safety, skin/wound care, disease management, medication administration, pain management, and patient education, does the patient require 24 hr/day rehab nursing? Yes Does the patient require coordinated care of a physician, rehab nurse, PT, OT, and SLP to address physical and functional deficits in the context of the above medical diagnosis(es)? Yes Addressing deficits in the following areas: balance, endurance, locomotion, strength, transferring, bowel/bladder control, bathing, dressing, feeding, grooming, toileting, cognition, speech, and language Can the patient actively participate in an intensive therapy program of at least 3 hrs of therapy 5 days a week? Yes The potential for patient to make measurable gains while on inpatient rehab is good Anticipated functional outcomes upon discharge from inpatient rehab: supervision PT, supervision OT, supervision SLP Estimated rehab length of stay to reach the above  functional goals is: 7-10 days Anticipated discharge destination: Home 10. Overall Rehab/Functional Prognosis: good     MD Signature:

## 2023-04-12 NOTE — Progress Notes (Signed)
Mobility Specialist Progress Note;   04/12/23 1000  Mobility  Activity Ambulated with assistance in hallway  Level of Assistance Moderate assist, patient does 50-74%  Assistive Device Front wheel walker  Distance Ambulated (ft) 80 ft  Activity Response Tolerated well  Mobility Referral Yes  $Mobility charge 1 Mobility  Mobility Specialist Start Time (ACUTE ONLY) 1000  Mobility Specialist Stop Time (ACUTE ONLY) 1020  Mobility Specialist Time Calculation (min) (ACUTE ONLY) 20 min   Pt received in bed, eager for mobility. Required MinA for bed mobility and STS, ModA during ambulation. Mod verbal cues required for larger L leg steps and RW proximity. L side lean seemed better while ambulating this session. No c/o during session. Pt left in chair with all needs met. Daughter in room.   Samuel Mata Mobility Specialist Please contact via SecureChat or Rehab Office [phone]

## 2023-04-12 NOTE — Discharge Summary (Signed)
Samuel Mata ZOX:[id number] DOB: [birth year] DOA: 04/07/2023  PCP: [facility]  Admit date: 04/07/2023  Discharge date: 04/12/2023  Admitted From: Home   Disposition:  CIR   Recommendations for Outpatient Follow-up:   Follow up with PCP in 1-2 weeks  PCP Please obtain BMP/CBC, 2 view CXR in 1week,  (see Discharge instructions)   PCP Please follow up on the following pending results:     Home Health: None   Equipment/Devices: None  Consultations: Neuro Discharge Condition: Stable    CODE STATUS: Full    Diet Recommendation: Heart Healthy     Chief Complaint  Patient presents with   Leg Numbness     Brief history of present illness from the day of admission and additional interim summary    89 y.o. male with medical history significant of COPD, hypertension, GERD, hereditary spherocytosis, sleep apnea who presents with left leg weakness.  No other acute complaints or recent issues, imaging in the ED confirmed acute CVA, neurology consulted and hospitalist called for admission.                                                                  Hospital Course   Acute CVA - 9 mm acute ischemic nonhemorrhagic right thalamocapsular infarct -Seen by stroke team, full stroke workup underway, echo stable, LDL above goal hence switched to Crestor, A1c stable, CTA with no emergent large vessel disease, have moderate bilateral V1 stenosis, waiting PT evaluation, will need SNF/CIR await bed, continue DAPT for now per neurology after 3 weeks Plavix only.   Hypertension  -Permissive hypertension ongoing, will gradually reintroduce blood pressure medications starting 04/12/2023   Sleep apnea -not use CPAP at home, outpatient sleep study is needed.   COPD, without acute exacerbation -On  room air, continue to follow   GERD -Continue to monitor   Hereditary spherocytosis - stable   Dyslipidemia.  Statin switched for better LDL control.    Discharge diagnosis     Principal Problem:   Acute CVA (cerebrovascular accident) [facility]) Active Problems:   Chronic obstructive pulmonary disease (HCC)   Essential hypertension   Gastroesophageal reflux disease without esophagitis   Hereditary spherocytosis (HCC)   History of tobacco use   OSA (obstructive sleep apnea)   Pulmonary infiltrates    Discharge instructions    Discharge Instructions     Ambulatory referral to Neurology   Complete by: As directed    Follow up with Jessica at [facility] in 4-6 weeks.  Patient is Jessica's patient. Thanks.   Diet - low sodium heart healthy   Complete by: As directed    Discharge instructions   Complete by: As directed    [facility] in 7 days   Get  CBC, CMP, 2 view Chest X ray -  checked next visit with your primary MD or SNF MD    Activity: As tolerated with Full fall precautions use walker/cane & assistance as needed  Disposition CIR/SNF  Diet: Heart Healthy    Special Instructions: If you have smoked or chewed Tobacco  in the last 2 yrs please stop smoking, stop any regular Alcohol  and or any Recreational drug use.  On your next visit with your primary care physician please Get Medicines reviewed and adjusted.  Please request your Prim.MD to go over all Hospital Tests and Procedure/Radiological results at the follow up, please get all Hospital records sent to your Prim MD by signing hospital release before you go home.  If you experience worsening of your admission symptoms, develop shortness of breath, life threatening emergency, suicidal or homicidal thoughts you must seek medical attention immediately by calling 911 or calling your MD immediately  if symptoms less severe.  You Must read complete  instructions/literature along with all the possible adverse reactions/side effects for all the Medicines you take and that have been prescribed to you. Take any new Medicines after you have completely understood and accpet all the possible adverse reactions/side effects.   Do not drive when taking Pain medications.  Do not take more than prescribed Pain, Sleep and Anxiety Medications   Increase activity slowly   Complete by: As directed        Discharge Medications   Allergies as of 04/12/2023       Reactions   Codeine    Unknown reaction        Medication List     STOP taking these medications    atorvastatin 40 MG tablet Commonly known as: LIPITOR   metoprolol succinate 25 MG 24 hr tablet Commonly known as: Toprol XL       TAKE these medications    acetaminophen 325 MG tablet Commonly known as: TYLENOL Take 650 mg by mouth as needed for headache.   aspirin EC 81 MG tablet Take 1 tablet (81 mg total) by mouth daily. Swallow whole.   CLEAR EYES OP Place 1 drop into both eyes at bedtime.   clopidogrel 75 MG tablet Commonly known as: PLAVIX Take 1 tablet (75 mg total) by mouth daily.   famotidine 20 MG tablet Commonly known as: PEPCID Take 1 tablet (20 mg total) by mouth daily.   fluticasone 50 MCG/ACT nasal spray Commonly known as: FLONASE Place 2 sprays into both nostrils daily.   metoprolol tartrate 25 MG tablet Commonly known as: LOPRESSOR Take 1 tablet (25 mg total) by mouth 2 (two) times daily.   nitroGLYCERIN 0.4 MG SL tablet Commonly known as: NITROSTAT Place 1 tablet (0.4 mg total) under the tongue every 5 (five) minutes as needed for chest pain.   rosuvastatin 20 MG tablet Commonly known as: CRESTOR Take 1 tablet (20 mg total) by mouth daily.   sodium chloride 0.65 % nasal spray Commonly known as: OCEAN Place 1 spray into the nose as needed for congestion.   tamsulosin 0.4 MG Caps capsule Commonly known as: FLOMAX Take 0.4 mg by  mouth daily.   TUMS PO Take 1 tablet by mouth daily as needed (for heartburn).         Follow-up Information     Ihor Austin, NP. Schedule an appointment as soon as possible for a visit in 1 month(s).   Specialty: Neurology Why: stroke clinic Contact information: [address] [place]  [address] [phone]         [facility]. Schedule an appointment as soon as possible for a visit in 1 week(s).   Specialty: Family Medicine Contact information: [address] [place] [address] [phone]                 Major procedures and Radiology Reports - PLEASE review detailed and final reports thoroughly  -      CT HEAD WO CONTRAST ( )  Result Date: 04/12/2023 CLINICAL DATA:  Follow-up examination for stroke. EXAM: CT HEAD WITHOUT CONTRAST TECHNIQUE: Contiguous axial images were obtained from the base of the skull through the vertex without intravenous contrast. RADIATION DOSE REDUCTION: This exam was performed according to the departmental dose-optimization program which includes automated exposure control, adjustment of the mA and/or kV according to patient size and/or use of iterative reconstruction technique. COMPARISON:  Prior MRI and CT from 04/08/2023. FINDINGS: Brain: Generalized age-related cerebral atrophy with chronic small vessel ischemic disease. Small remote left cerebellar infarct noted. Previously identified right thalamic infarct again noted, relatively stable in size and distribution from prior. No significant regional mass effect. No hemorrhagic transformation. No other acute cortically based infarct. No acute intracranial hemorrhage. No mass lesion or midline shift. No hydrocephalus or extra-axial fluid collection. Vascular: No abnormal hyperdense vessel. Calcified atherosclerosis present at the skull base. Focal calcified plaque at the right M1 segment noted, stable. Skull: Scalp soft tissues and  calvarium demonstrate no new finding. Sinuses/Orbits: Globes and orbital soft tissues within normal limits. Paranasal sinuses and mastoid air cells are largely clear. Other: None. IMPRESSION: 1. Normal expected interval evolution of previously identified right thalamic infarct. No hemorrhagic transformation or significant regional mass effect. 2. No other new acute intracranial abnormality. 3. Underlying age-related cerebral atrophy with chronic small vessel ischemic disease. Small remote left cerebellar infarct. Electronically Signed   By: Rise Mu M.D.   On: 04/12/2023 02:09   DG Ribs Bilateral  Result Date: 04/11/2023 CLINICAL DATA:  Pain after fall EXAM: BILATERAL RIBS - 7 VIEW COMPARISON:  Chest x-ray 04/07/2023. FINDINGS: Osteopenia. No displaced rib fracture is clearly identified. As seen on the prior x-ray there is hyperinflation with diffuse interstitial changes and slightly confluence nodular opacity overlying the right upper lung. Loop recorder. Degenerative changes of the spine. Compression deformity along the lower thoracic region with augmentation cement IMPRESSION: Osteopenia. No displaced rib fracture. No pneumothorax or effusion on these rib x-rays. Please correlate with the prior chest x-ray Electronically Signed   By: Karen Kays M.D.   On: 04/11/2023 17:03   ECHOCARDIOGRAM COMPLETE  Result Date: 04/08/2023    ECHOCARDIOGRAM REPORT   Patient Name:   RONDELL JOHNKE Date of Exam: 04/08/2023 Medical Rec #:  [id number]     Height:       67.0 in Accession #:    [id number]    Weight:       130.0 lb Date of Birth:  12-02-1930    BSA:          1.684 m Patient Age:    89 years      BP:           126/81 mmHg Patient Gender: M             HR:           57 bpm. Exam Location:  Inpatient Procedure: 2D Echo, Cardiac Doppler, Color Doppler and Saline Contrast Bubble  Study Indications:    Stroke  History:        Patient has prior history of Echocardiogram examinations, most                  recent 01/14/2021. Stroke; Risk Factors:Hypertension.  Sonographer:    Milda Smart Referring Phys: [id number] CORTNEY E DE LA TORRE  Sonographer Comments: Image acquisition challenging due to patient body habitus. IMPRESSIONS  1. Left ventricular ejection fraction, by estimation, is 55 to 60%. The left ventricle has normal function. The left ventricle has no regional wall motion abnormalities. Left ventricular diastolic parameters were normal.  2. Right ventricular systolic function is normal. The right ventricular size is normal. There is normal pulmonary artery systolic pressure.  3. The mitral valve is abnormal. Mild mitral valve regurgitation. No evidence of mitral stenosis. There is mild holosystolic prolapse of multiple scallops of the posterior leaflet of the mitral valve.  4. The aortic valve is tricuspid. Aortic valve regurgitation is not visualized. No aortic stenosis is present.  5. The inferior vena cava is normal in size with greater than 50% respiratory variability, suggesting right atrial pressure of 3 mmHg.  6. Agitated saline contrast bubble study was negative, with no evidence of any interatrial shunt. Comparison(s): No significant change from prior study. Conclusion(s)/Recommendation(s): Otherwise normal echocardiogram, with minor abnormalities described in the report. No intracardiac source of embolism detected on this transthoracic study. Consider a transesophageal echocardiogram to exclude cardiac source of embolism if clinically indicated. FINDINGS  Left Ventricle: Left ventricular ejection fraction, by estimation, is 55 to 60%. The left ventricle has normal function. The left ventricle has no regional wall motion abnormalities. The left ventricular internal cavity size was normal in size. There is  no left ventricular hypertrophy. Left ventricular diastolic parameters were normal. Right Ventricle: The right ventricular size is normal. No increase in right ventricular wall thickness.  Right ventricular systolic function is normal. There is normal pulmonary artery systolic pressure. The tricuspid regurgitant velocity is 1.90 m/s, and  with an assumed right atrial pressure of 3 mmHg, the estimated right ventricular systolic pressure is 17.4 mmHg. Left Atrium: Left atrial size was normal in size. Right Atrium: Right atrial size was normal in size. Pericardium: There is no evidence of pericardial effusion. Mitral Valve: The mitral valve is abnormal. There is mild holosystolic prolapse of multiple scallops of the posterior leaflet of the mitral valve. Mild mitral valve regurgitation. No evidence of mitral valve stenosis. MV peak gradient, 5.1 mmHg. The mean  mitral valve gradient is 1.0 mmHg. Tricuspid Valve: The tricuspid valve is normal in structure. Tricuspid valve regurgitation is trivial. No evidence of tricuspid stenosis. Aortic Valve: The aortic valve is tricuspid. Aortic valve regurgitation is not visualized. No aortic stenosis is present. Pulmonic Valve: The pulmonic valve was grossly normal. Pulmonic valve regurgitation is trivial. No evidence of pulmonic stenosis. Aorta: The aortic root, ascending aorta and aortic arch are all structurally normal, with no evidence of dilitation or obstruction. Venous: The inferior vena cava is normal in size with greater than 50% respiratory variability, suggesting right atrial pressure of 3 mmHg. IAS/Shunts: No atrial level shunt detected by color flow Doppler. Agitated saline contrast was given intravenously to evaluate for intracardiac shunting. Agitated saline contrast bubble study was negative, with no evidence of any interatrial shunt.  LEFT VENTRICLE PLAX 2D LVIDd:         4.80 cm     Diastology LVIDs:         3.00  cm     LV e' medial:    4.79 cm/s LV PW:         1.00 cm     LV E/e' medial:  13.8 LV IVS:        1.00 cm     LV e' lateral:   5.77 cm/s LVOT diam:     2.10 cm     LV E/e' lateral: 11.5 LV SV:         84 LV SV Index:   50 LVOT Area:      3.46 cm  LV Volumes (MOD) LV vol d, MOD A2C: 97.3 ml LV vol d, MOD A4C: 73.5 ml LV vol s, MOD A2C: 37.6 ml LV vol s, MOD A4C: 31.2 ml LV SV MOD A2C:     59.7 ml LV SV MOD A4C:     73.5 ml LV SV MOD BP:      49.4 ml RIGHT VENTRICLE             IVC RV Basal diam:  3.90 cm     IVC diam: 1.80 cm RV S prime:     13.10 cm/s TAPSE (M-mode): 2.8 cm LEFT ATRIUM             Index        RIGHT ATRIUM           Index LA diam:        2.50 cm 1.48 cm/m   RA Area:     12.50 cm LA Vol (A2C):   26.9 ml 15.97 ml/m  RA Volume:   28.60 ml  16.98 ml/m LA Vol (A4C):   29.0 ml 17.22 ml/m LA Biplane Vol: 28.3 ml 16.81 ml/m  AORTIC VALVE LVOT Vmax:   116.00 cm/s LVOT Vmean:  79.000 cm/s LVOT VTI:    0.243 m  AORTA Ao Root diam: 3.50 cm Ao Asc diam:  3.70 cm MITRAL VALVE               TRICUSPID VALVE MV Area (PHT): 1.98 cm    TR Peak grad:   14.4 mmHg MV Area VTI:   2.77 cm    TR Vmax:        190.00 cm/s MV Peak grad:  5.1 mmHg MV Mean grad:  1.0 mmHg    SHUNTS MV Vmax:       1.13 m/s    Systemic VTI:  0.24 m MV Vmean:      56.4 cm/s   Systemic Diam: 2.10 cm MV Decel Time: 383 msec MR Peak grad: 100.8 mmHg MR Mean grad: 74.0 mmHg MR Vmax:      502.00 cm/s MR Vmean:     415.0 cm/s MV E velocity: 66.20 cm/s MV A velocity: 83.10 cm/s MV E/A ratio:  0.80 Jodelle Red MD Electronically signed by Jodelle Red MD Signature Date/Time: 04/08/2023/7:43:07 PM    Final    MR BRAIN WO CONTRAST  Result Date: 04/08/2023 CLINICAL DATA:  Initial evaluation for acute neuro deficit, stroke. EXAM: MRI HEAD WITHOUT CONTRAST TECHNIQUE: Multiplanar, multiecho pulse sequences of the brain and surrounding structures were obtained without intravenous contrast. COMPARISON:  Prior CTs from earlier the same day. FINDINGS: Brain: Generalized age-related cerebral atrophy. Patchy T2/FLAIR hyperintensity involving the periventricular and deep white matter both cerebral hemispheres, consistent with chronic small vessel ischemic disease,  moderately advanced in nature. Mild patchy involvement of the pons noted. Few small remote bilateral cerebellar infarcts noted. 9 mm focus of restricted diffusion seen  involving the right thalamocapsular region, consistent with a small acute ischemic infarct. No associated hemorrhage or mass effect. No other acute or subacute ischemia. Gray-white matter differentiation otherwise maintained. No acute intracranial hemorrhage. Single chronic microhemorrhage noted at the left parietal lobe. No mass lesion, midline shift or mass effect. No hydrocephalus or extra-axial fluid collection. Pituitary gland and suprasellar region within normal limits. Vascular: Major intracranial vascular flow voids are maintained. Skull and upper cervical spine: Craniocervical junction within normal limits. Bone marrow signal intensity normal. No scalp soft tissue abnormality. Sinuses/Orbits: Globes and orbital soft tissues within normal limits. Paranasal sinuses are largely clear. No significant mastoid effusion. Other: None. IMPRESSION: 1. 9 mm acute ischemic nonhemorrhagic right thalamocapsular infarct. 2. Underlying age-related cerebral atrophy with moderate chronic microvascular ischemic disease, with a few small remote bilateral cerebellar infarcts. Electronically Signed   By: Rise Mu M.D.   On: 04/08/2023 03:53   DG Chest Port 1 View  Result Date: 04/08/2023 CLINICAL DATA:  Weakness EXAM: PORTABLE CHEST 1 VIEW COMPARISON:  07/22/2020 FINDINGS: Cardiac shadow is within normal limits. Loop recorder is now seen. Lungs are well aerated bilaterally. Diffuse fibrotic changes are noted bilaterally. A somewhat nodular density is noted in the right upper lobe incompletely evaluated on this exam. No sizable effusion is seen. No other focal abnormality is noted. IMPRESSION: Nodular density in the right upper lobe incompletely evaluated on this exam. CT of the chest would be helpful for further evaluation. Chronic fibrotic  changes. Electronically Signed   By: Alcide Clever M.D.   On: 04/08/2023 02:10   CT HEAD WO CONTRAST  Result Date: 04/08/2023 CLINICAL DATA:  Initial evaluation for neuro deficit, stroke suspected, left-sided weakness. EXAM: CT ANGIOGRAPHY HEAD AND NECK TECHNIQUE: Multidetector CT imaging of the head and neck was performed using the standard protocol during bolus administration of intravenous contrast. Multiplanar CT image reconstructions and MIPs were obtained to evaluate the vascular anatomy. Carotid stenosis measurements (when applicable) are obtained utilizing NASCET criteria, using the distal internal carotid diameter as the denominator. RADIATION DOSE REDUCTION: This exam was performed according to the departmental dose-optimization program which includes automated exposure control, adjustment of the mA and/or kV according to patient size and/or use of iterative reconstruction technique. CONTRAST:  75mL OMNIPAQUE IOHEXOL 350 MG/ML SOLN COMPARISON:  Prior study from 01/13/2021. FINDINGS: CT HEAD FINDINGS Brain: Cerebral volume within normal limits. Mild chronic microvascular ischemic disease. Small remote left cerebellar infarct. No acute intracranial hemorrhage. No acute large vessel territory infarct. No mass lesion or midline shift. No hydrocephalus or extra-axial fluid collection. Vascular: No abnormal hyperdense vessel. Calcified atherosclerosis present at skull base. Skull: Scalp soft tissues within normal limits.  Calvarium intact. Sinuses/Orbits: Globes and orbital soft tissues within normal limits. Paranasal sinuses are largely clear. No mastoid effusion. Other: None. Review of the MIP images confirms the above findings CTA NECK FINDINGS Aortic arch: Visualized aortic arch within normal limits for caliber with standard branch pattern. Aortic atherosclerosis. No significant stenosis about the origin the great vessels. Right carotid system: Right common and internal carotid arteries are patent  without dissection or stenosis. Left carotid system: Left common and internal carotid arteries are patent without dissection or stenosis. Vertebral arteries: Both vertebral arteries arise from the subclavian arteries. Atheromatous change at the origin of the right vertebral artery with moderate stenosis. Additional focal moderate stenosis involving the contralateral left V1 segment noted. Vertebral arteries otherwise patent without dissection. Skeleton: No discrete or worrisome osseous lesions. Moderate spondylosis present at C5-6  and C6-7. Other neck: No other acute finding. Upper chest: Multifocal patchy and nodular densities within the visualized lungs, nonspecific, and could be either infectious or inflammatory in nature. Review of the MIP images confirms the above findings CTA HEAD FINDINGS Anterior circulation: Mild atheromatous change about the carotid siphons without hemodynamically significant stenosis. A1 segments patent bilaterally. Normal anterior communicating artery complex. Anterior cerebral arteries patent without stenosis. No M1 stenosis or occlusion. Distal MCA branches perfused and symmetric. Posterior circulation: Both V4 segments widely patent. Left vertebral artery slightly dominant. Both PICA patent. Basilar patent without stenosis. Superior cerebellar and posterior cerebral arteries patent bilaterally. Venous sinuses: Grossly patent allowing for timing the contrast bolus. Anatomic variants: None significant.  No aneurysm. Review of the MIP images confirms the above findings IMPRESSION: CT HEAD: 1. No acute intracranial abnormality. 2. 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 CTA HEAD AND NECK: 1. Negative CTA for large vessel occlusion or other emergent finding. 2. Moderate bilateral V1 stenoses. 3. Mild atheromatous change about the carotid bifurcations and carotid siphons without hemodynamically significant stenosis. 4. Multifocal patchy and  nodular densities within the visualized lungs, nonspecific, and could be either infectious or inflammatory in nature. Findings are incompletely assessed on this exam, and correlation with dedicated chest CT recommended. 5.  Aortic Atherosclerosis (ICD10-I70.0). Electronically Signed   By: Rise Mu M.D.   On: 04/08/2023 01:49   CT ANGIO HEAD NECK W WO CM  Result Date: 04/08/2023 CLINICAL DATA:  Initial evaluation for neuro deficit, stroke suspected, left-sided weakness. EXAM: CT ANGIOGRAPHY HEAD AND NECK TECHNIQUE: Multidetector CT imaging of the head and neck was performed using the standard protocol during bolus administration of intravenous contrast. Multiplanar CT image reconstructions and MIPs were obtained to evaluate the vascular anatomy. Carotid stenosis measurements (when applicable) are obtained utilizing NASCET criteria, using the distal internal carotid diameter as the denominator. RADIATION DOSE REDUCTION: This exam was performed according to the departmental dose-optimization program which includes automated exposure control, adjustment of the mA and/or kV according to patient size and/or use of iterative reconstruction technique. CONTRAST:  75mL OMNIPAQUE IOHEXOL 350 MG/ML SOLN COMPARISON:  Prior study from 01/13/2021. FINDINGS: CT HEAD FINDINGS Brain: Cerebral volume within normal limits. Mild chronic microvascular ischemic disease. Small remote left cerebellar infarct. No acute intracranial hemorrhage. No acute large vessel territory infarct. No mass lesion or midline shift. No hydrocephalus or extra-axial fluid collection. Vascular: No abnormal hyperdense vessel. Calcified atherosclerosis present at skull base. Skull: Scalp soft tissues within normal limits.  Calvarium intact. Sinuses/Orbits: Globes and orbital soft tissues within normal limits. Paranasal sinuses are largely clear. No mastoid effusion. Other: None. Review of the MIP images confirms the above findings CTA NECK  FINDINGS Aortic arch: Visualized aortic arch within normal limits for caliber with standard branch pattern. Aortic atherosclerosis. No significant stenosis about the origin the great vessels. Right carotid system: Right common and internal carotid arteries are patent without dissection or stenosis. Left carotid system: Left common and internal carotid arteries are patent without dissection or stenosis. Vertebral arteries: Both vertebral arteries arise from the subclavian arteries. Atheromatous change at the origin of the right vertebral artery with moderate stenosis. Additional focal moderate stenosis involving the contralateral left V1 segment noted. Vertebral arteries otherwise patent without dissection. Skeleton: No discrete or worrisome osseous lesions. Moderate spondylosis present at C5-6 and C6-7. Other neck: No other acute finding. Upper chest: Multifocal patchy and nodular densities within the visualized lungs, nonspecific, and could be either  infectious or inflammatory in nature. Review of the MIP images confirms the above findings CTA HEAD FINDINGS Anterior circulation: Mild atheromatous change about the carotid siphons without hemodynamically significant stenosis. A1 segments patent bilaterally. Normal anterior communicating artery complex. Anterior cerebral arteries patent without stenosis. No M1 stenosis or occlusion. Distal MCA branches perfused and symmetric. Posterior circulation: Both V4 segments widely patent. Left vertebral artery slightly dominant. Both PICA patent. Basilar patent without stenosis. Superior cerebellar and posterior cerebral arteries patent bilaterally. Venous sinuses: Grossly patent allowing for timing the contrast bolus. Anatomic variants: None significant.  No aneurysm. Review of the MIP images confirms the above findings IMPRESSION: CT HEAD: 1. No acute intracranial abnormality. 2. 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 CTA HEAD  AND NECK: 1. Negative CTA for large vessel occlusion or other emergent finding. 2. Moderate bilateral V1 stenoses. 3. Mild atheromatous change about the carotid bifurcations and carotid siphons without hemodynamically significant stenosis. 4. Multifocal patchy and nodular densities within the visualized lungs, nonspecific, and could be either infectious or inflammatory in nature. Findings are incompletely assessed on this exam, and correlation with dedicated chest CT recommended. 5.  Aortic Atherosclerosis (ICD10-I70.0). Electronically Signed   By: Rise Mu M.D.   On: 04/08/2023 01:49   CUP PACEART REMOTE DEVICE CHECK  Result Date: 04/04/2023 ILR summary report received. Battery status OK. Normal device function. No new symptom, tachy, brady, or pause episodes. No new AF episodes. AF burden is 0% of the time.  Monthly summary reports and ROV/PRN ML, CVRS   Micro Results    No results found for this or any previous visit (from the past 240 hour(s)).  Today   Subjective    Samuel Mata today has no headache,no chest abdominal pain,no new weakness tingling or numbness, feels much better wants to go home today.    Objective   Blood pressure (!) 147/88, pulse 67, temperature 98.6 F (37 C), temperature source Oral, resp. rate 16, height 5\' 7"  (1.702 m), weight 59 kg, SpO2 92%.   Intake/Output Summary (Last 24 hours) at 04/12/2023 0819 Last data filed at 04/11/2023 1700 Gross per 24 hour  Intake 240 ml  Output --  Net 240 ml    Exam  Awake Alert, No new F.N deficits,    [place].AT,PERRAL Supple Neck,   Symmetrical Chest wall movement, Good air movement bilaterally, CTAB RRR,No Gallops,   +ve B.Sounds, Abd Soft, Non tender,  No Cyanosis, Clubbing or edema    Data Review   Recent Labs  Lab 04/08/23 0000 04/11/23 0427  WBC 9.0 8.0  HGB 14.5  15.0 13.7  HCT 43.8  44.0 40.5  PLT 361 345  MCV 93.2 91.6  MCH 30.9 31.0  MCHC 33.1 33.8  RDW 12.0 12.2  LYMPHSABS 1.5  1.4  MONOABS 1.5* 1.6*  EOSABS 0.3 0.2  BASOSABS 0.1 0.0    Recent Labs  Lab 04/08/23 0000 04/08/23 0924 04/11/23 0427  NA 137  138  --  131*  K 4.2  4.3  --  3.8  CL 104  102  --  100  CO2 23  --  27  ANIONGAP 10  --  4*  GLUCOSE 99  98  --  94  BUN 18  21  --  15  CREATININE 1.04  0.90  --  0.95  AST 21  --   --   ALT 11  --   --   ALKPHOS 60  --   --  BILITOT 0.6  --   --   ALBUMIN 3.0*  --   --   INR 1.1  --   --   HGBA1C  --  5.4  --   MG  --   --  1.9  CALCIUM 9.0  --  8.6*    Total Time in preparing paper work, data evaluation and todays exam - 35 minutes  Signature  -    Susa Raring M.D on 04/12/2023 at 8:19 AM   -  To page go to [email or web address]

## 2023-04-12 NOTE — Progress Notes (Signed)
INPATIENT REHABILITATION ADMISSION NOTE   Arrival Method:bed     Mental Orientation:alert   Assessment:done   Skin:done   IV'S:none   Pain:none   Tubes and Drains:none   Safety Measures: done  Vital Signs:done   Height and Weight:done   Rehab Orientation:done;    Family:daughter and wife in room

## 2023-04-12 NOTE — Progress Notes (Signed)
Inpatient Rehab Admissions Coordinator:    Pt. Will admit to CIR today. RN may call report to [phone].    Pt. To admit to CIR for intensive rehab for an estimated 10-12 days with the goal of reaching supervision level and returning home with 24/7 supervision from his wife.   Megan Salon, MS, CCC-SLP Rehab Admissions Coordinator  [phone] (celll) [phone] (office)

## 2023-04-12 NOTE — Plan of Care (Signed)
  Problem: RH BLADDER ELIMINATION Goal: RH STG MANAGE BLADDER WITH ASSISTANCE Description: STG Manage Bladder With mod I Assistance Outcome: Not Progressing; incontinence; condom cath    Problem: RH KNOWLEDGE DEFICIT Goal: RH STG INCREASE KNOWLEDGE OF HYPERTENSION Description: Patient and wife will be able to manage HTN with medications and dietary modification using educational resources independently Outcome: Not Progressing; patient is hard of hearing

## 2023-04-12 NOTE — TOC Transition Note (Signed)
Transition of Care [facility]) - CM/SW Discharge Note   Patient Details  Name: Samuel Mata MRN: [id number] Date of Birth: 07-25-1930  [facility] CM/SW Contact:  Mearl Latin, LCSW Phone Number: 04/12/2023, 10:10 AM   Clinical Narrative:    Patient discharging to CIR today. No further TOC needs identified.    Final next level of care: IP Rehab Facility Barriers to Discharge: Barriers Resolved   Patient Goals and CMS Choice   Choice offered to / list presented to : Patient  Discharge Placement                         Discharge Plan and Services Additional resources added to the After Visit Summary for                                       Social Determinants of Health (SDOH) Interventions SDOH Screenings   Food Insecurity: No Food Insecurity (04/08/2023)  Housing: Low Risk  (04/08/2023)  Transportation Needs: No Transportation Needs (04/08/2023)  Utilities: Not At Risk (04/08/2023)  Depression (PHQ2-9): Low Risk  (03/12/2021)  Financial Resource Strain: Low Risk  (11/23/2022)   Received from [facility]  Physical Activity: Insufficiently Active (11/23/2022)   Received from [facility]  Social Connections: Socially Integrated (11/23/2022)   Received from [facility]  Stress: No Stress Concern Present (11/23/2022)   Received from [facility]  Tobacco Use: Medium Risk (04/08/2023)     Readmission Risk Interventions     No data to display

## 2023-04-12 NOTE — Plan of Care (Signed)
  Problem: Education: Goal: Knowledge of General Education information will improve Description: Including pain rating scale, medication(s)/side effects and non-pharmacologic comfort measures Outcome: Progressing   Problem: Health Behavior/Discharge Planning: Goal: Ability to manage health-related needs will improve Outcome: Progressing   Problem: Clinical Measurements: Goal: Ability to maintain clinical measurements within normal limits will improve Outcome: Progressing Goal: Will remain free from infection Outcome: Progressing Goal: Diagnostic test results will improve Outcome: Progressing Goal: Respiratory complications will improve Outcome: Progressing Goal: Cardiovascular complication will be avoided Outcome: Progressing   Problem: Nutrition: Goal: Adequate nutrition will be maintained Outcome: Progressing   Problem: Coping: Goal: Level of anxiety will decrease Outcome: Progressing   Problem: Pain Management: Goal: General experience of comfort will improve Outcome: Progressing   Problem: Safety: Goal: Ability to remain free from injury will improve Outcome: Progressing   Problem: Skin Integrity: Goal: Risk for impaired skin integrity will decrease Outcome: Progressing

## 2023-04-12 NOTE — Progress Notes (Signed)
Pt has been transported to 4W via wheelchair by RN. Pt has taken all belongings with him.

## 2023-04-12 NOTE — Discharge Instructions (Signed)
  Follow with [facility] in 7 days   Get CBC, CMP, 2 view Chest X ray -  checked next visit with your primary MD or SNF MD    Activity: As tolerated with Full fall precautions use walker/cane & assistance as needed  Disposition CIR/SNF  Diet: Heart Healthy    Special Instructions: If you have smoked or chewed Tobacco  in the last 2 yrs please stop smoking, stop any regular Alcohol  and or any Recreational drug use.  On your next visit with your primary care physician please Get Medicines reviewed and adjusted.  Please request your Prim.MD to go over all Hospital Tests and Procedure/Radiological results at the follow up, please get all Hospital records sent to your Prim MD by signing hospital release before you go home.  If you experience worsening of your admission symptoms, develop shortness of breath, life threatening emergency, suicidal or homicidal thoughts you must seek medical attention immediately by calling 911 or calling your MD immediately  if symptoms less severe.  You Must read complete instructions/literature along with all the possible adverse reactions/side effects for all the Medicines you take and that have been prescribed to you. Take any new Medicines after you have completely understood and accpet all the possible adverse reactions/side effects.   Do not drive when taking Pain medications.  Do not take more than prescribed Pain, Sleep and Anxiety Medications

## 2023-04-13 DIAGNOSIS — F05 Delirium due to known physiological condition: Secondary | ICD-10-CM | POA: Diagnosis not present

## 2023-04-13 DIAGNOSIS — I10 Essential (primary) hypertension: Secondary | ICD-10-CM | POA: Diagnosis not present

## 2023-04-13 DIAGNOSIS — I6381 Other cerebral infarction due to occlusion or stenosis of small artery: Secondary | ICD-10-CM | POA: Diagnosis not present

## 2023-04-13 LAB — CBC WITH DIFFERENTIAL/PLATELET
Abs Immature Granulocytes: 0.02 10*3/uL (ref 0.00–0.07)
Basophils Absolute: 0.1 10*3/uL (ref 0.0–0.1)
Basophils Relative: 1 %
Eosinophils Absolute: 0.2 10*3/uL (ref 0.0–0.5)
Eosinophils Relative: 3 %
HCT: 39.5 % (ref 39.0–52.0)
Hemoglobin: 13.6 g/dL (ref 13.0–17.0)
Immature Granulocytes: 0 %
Lymphocytes Relative: 13 %
Lymphs Abs: 1.1 10*3/uL (ref 0.7–4.0)
MCH: 31.5 pg (ref 26.0–34.0)
MCHC: 34.4 g/dL (ref 30.0–36.0)
MCV: 91.4 fL (ref 80.0–100.0)
Monocytes Absolute: 1.4 10*3/uL — ABNORMAL HIGH (ref 0.1–1.0)
Monocytes Relative: 16 %
Neutro Abs: 5.7 10*3/uL (ref 1.7–7.7)
Neutrophils Relative %: 67 %
Platelets: 358 10*3/uL (ref 150–400)
RBC: 4.32 MIL/uL (ref 4.22–5.81)
RDW: 12 % (ref 11.5–15.5)
WBC: 8.5 10*3/uL (ref 4.0–10.5)
nRBC: 0 % (ref 0.0–0.2)

## 2023-04-13 LAB — COMPREHENSIVE METABOLIC PANEL
ALT: 13 U/L (ref 0–44)
AST: 19 U/L (ref 15–41)
Albumin: 2.5 g/dL — ABNORMAL LOW (ref 3.5–5.0)
Alkaline Phosphatase: 57 U/L (ref 38–126)
Anion gap: 6 (ref 5–15)
BUN: 12 mg/dL (ref 8–23)
CO2: 25 mmol/L (ref 22–32)
Calcium: 9 mg/dL (ref 8.9–10.3)
Chloride: 103 mmol/L (ref 98–111)
Creatinine, Ser: 0.79 mg/dL (ref 0.61–1.24)
GFR, Estimated: >60 mL/min (ref >60)
Glucose, Bld: 98 mg/dL (ref 70–99)
Potassium: 3.9 mmol/L (ref 3.5–5.1)
Sodium: 134 mmol/L — ABNORMAL LOW (ref 135–145)
Total Bilirubin: 0.6 mg/dL (ref <1.2)
Total Protein: 6.2 g/dL — ABNORMAL LOW (ref 6.5–8.1)

## 2023-04-13 NOTE — Plan of Care (Signed)
  Problem: RH Memory Goal: LTG Patient will use memory compensatory aids to (SLP) Description: LTG:  Patient will use memory compensatory aids to recall biographical/new, daily complex information with cues (SLP) Flowsheets (Taken 04/13/2023 1218) LTG: Patient will use memory compensatory aids to (SLP): Supervision   Problem: RH Pre-functional/Other (Specify) Goal: RH LTG SLP (Specify) 1 Description: RH LTG SLP (Specify) 1 Flowsheets (Taken 04/13/2023 1218) LTG: Other SLP (Specify) 1: Patient will increase processing speed during functional tasks given supervision multimodal A

## 2023-04-13 NOTE — Evaluation (Signed)
Physical Therapy Assessment and Plan  Patient Details  Name: Samuel Mata MRN: [id number] Date of Birth: [birth year]  PT Diagnosis: Abnormal posture, Abnormality of gait, Cognitive deficits, Difficulty walking, Hemiparesis non-dominant, Impaired cognition, and Muscle weakness Rehab Potential: Good ELOS: 2-2.5 weeks   Today's Date: 04/13/2023 PT Individual Time: 8295-6213 PT Individual Time Calculation (min): 72 min    Hospital Problem: Principal Problem:   Right thalamic stroke [facility])   Past Medical History:  Past Medical History:  Diagnosis Date   Bronchiectasis without complication (HCC)    Enlarged prostate    Hearing loss    Hypertension    Pulmonary nocardiosis (HCC) 2021   treated with 6 month antibiotic course   Renal stones    Stroke (HCC) 2022   left frontoparietal   TIA (transient ischemic attack) 2019   Past Surgical History:  Past Surgical History:  Procedure Laterality Date   APPENDECTOMY     CHOLECYSTECTOMY     LOOP RECORDER INSERTION N/A 01/15/2021   Procedure: LOOP RECORDER INSERTION;  Surgeon: Hillis Range, MD;  Location: [facility] INVASIVE CV [facility];  Service: Cardiovascular;  Laterality: N/A;   NOSE SURGERY     SPINE SURGERY     compressoin fx T12, kyphoplasty   SPLENECTOMY, TOTAL      Assessment & Plan Clinical Impression: Patient is a 89 y.o. R handed male with history of HTN, COPD, Left frontoparietal stroke '22 s/p loop recorder who was admitted on 04/07/23 with reports progressive LLE weakness X 24 hours as well as gradual cognitive decline. CTA negative for LVO, moderate bilateral V1 stenosis and multifocal patchy nodular density in visualized lungs. MRI brain done revealing 9 mm acute ischemic right thalamocapsular infarct with moderate chronic microvascular ischemic disease. 2D echo showed EF 55-60% with mild MVR, negative bubble study and no wall abnormality. Loop recorder without A fib. Dr. Pearlean Brownie felt that stroke was due to small vessel disease and  recommended DAPT X 3 weeks followed by Plavix alone. Yesterday evening, patient with LUE weakness/pronator drift and CT head repeated showing evolution of stroke and negative for bleed. Therapy has been working with patient who is limited by mild left sided neglect, left lean, left flank pain, balance deficits and flexed posture. He has been requiring min assist overall and today was requiring mod assist with mobility specialist. CIR recommended due to functional decline. Patient transferred to CIR on 04/12/2023 .   Patient currently requires mod with mobility secondary to muscle weakness, decreased cardiorespiratoy endurance, impaired timing and sequencing, unbalanced muscle activation, and decreased motor planning, decreased midline orientation and decreased attention to left, decreased initiation, decreased attention, decreased awareness, decreased safety awareness, decreased memory, and delayed processing, and decreased sitting balance, decreased standing balance, decreased postural control, hemiplegia, and decreased balance strategies.  Prior to hospitalization, patient was modified independent  with mobility and lived with Spouse (wife is 90y.o. and cannot provide assistance but cognitively intact to provide supervision; 1 DTR lives in [place] in a House home.  Home access is 1 + 1Stairs to enter.  Patient will benefit from skilled PT intervention to maximize safe functional mobility, minimize fall risk, and decrease caregiver burden for planned discharge home with 24 hour assist.  Anticipate patient will benefit from follow up [facility] at discharge.  PT - End of Session Activity Tolerance: Tolerates 30+ min activity with multiple rests Endurance Deficit: Yes Endurance Deficit Description: requires multiple seated rest breaks PT Assessment Rehab Potential (ACUTE/IP ONLY): Good PT Barriers to Discharge: Decreased caregiver support;Lack  of/limited family support;Other (comments) PT Barriers  to Discharge Comments: pt's impaired awareness PT Patient demonstrates impairments in the following area(s): Balance;Pain;Perception;Behavior;Safety;Sensory;Endurance;Motor;Skin Integrity;Nutrition PT Transfers Functional Problem(s): Bed Mobility;Bed to Chair;Car;Furniture PT Locomotion Functional Problem(s): Ambulation;Stairs PT Plan PT Intensity: Minimum of 1-2 x/day ,45 to 90 minutes PT Frequency: 5 out of 7 days PT Duration Estimated Length of Stay: 2-2.5 weeks PT Treatment/Interventions: Ambulation/gait training;Cognitive remediation/compensation;Discharge planning;DME/adaptive equipment instruction;Functional mobility training;Pain management;Psychosocial support;Therapeutic Activities;UE/LE Strength taining/ROM;Visual/perceptual remediation/compensation;Balance/vestibular training;Community reintegration;Disease management/prevention;Neuromuscular re-education;Functional electrical stimulation;Patient/family education;Skin care/wound Engineer, manufacturing systems;Therapeutic Exercise;UE/LE Coordination activities PT Transfers Anticipated Outcome(s): CGA using LRAD PT Locomotion Anticipated Outcome(s): CGA using LRAD PT Recommendation Recommendations for Other Services: Therapeutic Recreation consult Therapeutic Recreation Interventions: Stress management Follow Up Recommendations: Home health PT;24 hour supervision/assistance Patient destination: Home Equipment Recommended: To be determined   PT Evaluation Precautions/Restrictions Precautions Precautions: Fall;Other (comment) Precaution Comments: HOH (hears better in R ear), sundowning, L inattention, L vs posterior lean Restrictions Weight Bearing Restrictions: No Pain Pain Assessment Pain Scale: 0-10 Pain Score: 0-No pain Pain Interference Pain Interference Pain Effect on Sleep: 1. Rarely or not at all Pain Interference with Therapy Activities: 1. Rarely or not at all Pain Interference with Day-to-Day Activities: 1. Rarely or  not at all Home Living/Prior Functioning Home Living Living Arrangements: Spouse/significant other Available Help at Discharge: Family;Available 24 hours/day (wife can only provide supervision; family might hire assistance) Type of Home: House Home Access: Stairs to enter [employer] of Steps: 1 + 1 Entrance Stairs-Rails: None Home Layout: One level Bathroom Shower/Tub: Health visitor: Standard Bathroom Accessibility: Yes Additional Comments: walked around neighborhood every day  Lives With: Spouse (wife is 90y.o. and cannot provide assistance but cognitively intact to provide supervision; 1 DTR lives in [place] Prior Function Level of Independence: Requires assistive device for independence;Independent with basic ADLs;Independent with homemaking with ambulation;Independent with gait (has used a cane since prior CVA in 2022)  Able to Take Stairs?: Yes Driving: Yes (short distances) Vocation: Retired Vision/Perception  Vision - History Ability to See in Adequate Light: 1 Impaired Vision - Assessment Eye Alignment: Within Functional Limits Ocular Range of Motion: Within Functional Limits Alignment/Gaze Preference: Within Defined Limits Tracking/Visual Pursuits: Other (comment) (eyes a little jumpy, less smooth, throughout all movements) Perception Perception: Impaired Preception Impairment Details: Inattention/Neglect;Spatial orientation (impaired midline orientation with posterior lean bias) Perception-Other Comments: L inattention Praxis Praxis: Impaired Praxis Impairment Details: Motor planning  Cognition Overall Cognitive Status: Impaired/Different from baseline Arousal/Alertness: Awake/alert Orientation Level: Oriented X4 Year: 2024 Month: November Day of Week: Correct Attention: Focused;Sustained;Selective Focused Attention: Appears intact Sustained Attention: Impaired (very easily internally and externally  distracted) Selective Attention: Impaired Memory: Impaired Awareness: Impaired Problem Solving: Impaired Safety/Judgment: Impaired Comments: mild impairment d/t problem solving and awareness deficit Sensation Sensation Light Touch: Appears Intact Hot/Cold: Not tested Proprioception: Impaired by gross assessment (impaired L hemibody location awareness during mobility tasks) Stereognosis: Not tested Coordination Gross Motor Movements are Fluid and Coordinated: No Fine Motor Movements are Fluid and Coordinated: No Coordination and Movement Description: impaired due to L hemiparesis, impaired midline orientation, and impaired balance Motor  Motor Motor: Other (comment);Abnormal postural alignment and control Motor - Skilled Clinical Observations: mild L hemiparesis, decreased activity tolerance,  impaired midline orientation   Trunk/Postural Assessment  Cervical Assessment Cervical Assessment: Exceptions to [facility] (head tilt towards R, mild forward head) Thoracic Assessment Thoracic Assessment: Exceptions to [facility] (rounded shoulders) Lumbar Assessment Lumbar Assessment: Exceptions to [facility] (posterior pelvic tilt in sitting) Postural Control Postural Control: Deficits  on evaluation Trunk Control: impaired with persistent posterior lean LOB sitting EOB Righting Reactions: delayed and insufficient  Balance Balance Balance Assessed: Yes Static Sitting Balance Static Sitting - Balance Support: Feet supported;Bilateral upper extremity supported Static Sitting - Level of Assistance: 4: Min assist;3: Mod assist Dynamic Sitting Balance Dynamic Sitting - Balance Support: Feet supported;Bilateral upper extremity supported Dynamic Sitting - Level of Assistance: 3: Mod assist;2: Max assist Static Standing Balance Static Standing - Balance Support: During functional activity;Bilateral upper extremity supported Static Standing - Level of Assistance: 3: Mod assist Dynamic Standing Balance Dynamic  Standing - Balance Support: During functional activity;Bilateral upper extremity supported Dynamic Standing - Level of Assistance: 3: Mod assist;2: Max assist Extremity Assessment  RLE Assessment RLE Assessment: Exceptions to [facility] Active Range of Motion (AROM) Comments: WFL/WNL General Strength Comments: assessed sitting in w/c RLE Strength Right Hip Flexion: 4+/5 Right Knee Flexion: 5/5 Right Knee Extension: 5/5 Right Ankle Dorsiflexion: 5/5 Right Ankle Plantar Flexion: 5/5 LLE Assessment LLE Assessment: Exceptions to [facility] Active Range of Motion (AROM) Comments: WFL General Strength Comments: assessed sitting in w/c LLE Strength Left Hip Flexion: 3-/5 Left Knee Flexion: 3+/5 Left Knee Extension: 3+/5 Left Ankle Dorsiflexion: 4-/5 Left Ankle Plantar Flexion: 4-/5  Care Tool Care Tool Bed Mobility Roll left and right activity   Roll left and right assist level: Minimal Assistance - Patient > 75%    Sit to lying activity   Sit to lying assist level: Moderate Assistance - Patient 50 - 74%    Lying to sitting on side of bed activity   Lying to sitting on side of bed assist level: the ability to move from lying on the back to sitting on the side of the bed with no back support.: Moderate Assistance - Patient 50 - 74%     Care Tool Transfers Sit to stand transfer   Sit to stand assist level: Moderate Assistance - Patient 50 - 74%    Chair/bed transfer   Chair/bed transfer assist level: Moderate Assistance - Patient 50 - 74%    Car transfer Car transfer activity did not occur: Safety/medical concerns        Care Tool Locomotion Ambulation   Assist level: Moderate Assistance - Patient 50 - 74% Assistive device: Walker-rolling Max distance: 15ft  Walk 10 feet activity   Assist level: Moderate Assistance - Patient - 50 - 74% Assistive device: Walker-rolling   Walk 50 feet with 2 turns activity   Assist level: Moderate Assistance - Patient - 50 - 74% Assistive device:  Walker-rolling  Walk 150 feet activity Walk 150 feet activity did not occur: Safety/medical concerns      Walk 10 feet on uneven surfaces activity Walk 10 feet on uneven surfaces activity did not occur: Safety/medical concerns      Stairs Stair activity did not occur: Safety/medical concerns (unsafe to attempt without skilled assistance)        Walk up/down 1 step activity Walk up/down 1 step or curb (drop down) activity did not occur: Safety/medical concerns      Walk up/down 4 steps activity Walk up/down 4 steps activity did not occur: Safety/medical concerns      Walk up/down 12 steps activity Walk up/down 12 steps activity did not occur: Safety/medical concerns      Pick up small objects from floor   Pick up small object from the floor assist level: Dependent - Patient 0% Pick up small object from the floor assistive device: using RW  Wheelchair Is  the patient using a wheelchair?: Yes Type of Wheelchair: Manual   Wheelchair assist level: Dependent - Patient 0%    Wheel 50 feet with 2 turns activity   Assist Level: Dependent - Patient 0%  Wheel 150 feet activity   Assist Level: Dependent - Patient 0%    Refer to Care Plan for Long Term Goals  SHORT TERM GOAL WEEK 1 PT Short Term Goal 1 (Week 1): Pt will perform supine<>sit with min A PT Short Term Goal 2 (Week 1): Pt will perform sit<>stands using LRAD with min A PT Short Term Goal 3 (Week 1): Pt will perform bed<>chair transfer using LRAD with min A PT Short Term Goal 4 (Week 1): Pt will ambulate at least 68ft using LRAD with min A  Recommendations for other services: Therapeutic Recreation  Stress management  Skilled Therapeutic Intervention Pt received, awake, supine in bed with his daughter present and pt agreeable to therapy session. Evaluation completed (see details above) with patient education regarding purpose of PT evaluation, PT POC and goals, therapy schedule, weekly team meetings, and other CIR information  including safety plan and fall risk safety. Pt performed the below functional mobility tasks with the specified levels of skilled cuing and assistance. Pt demonstrates poor sitting balance with persistent posterior lean/LOB while sitting EOB with delayed and insufficient balance recovery strategies. During standing/ambulatory training pt demos L lateral lean with poor R weight shift onto R stance limb to allow L LE swing advancement. Pt also demonstrates significant distractibility (internally and externally) requiring increased cuing to attend to the task. Pt significantly fatigued by end of session and assisted with returning to bed to rest. Pt left supine in bed with needs in reach, bed alarm on, and his DTR present.  Mobility Bed Mobility Bed Mobility: Supine to Sit;Sit to Supine Supine to Sit: Minimal Assistance - Patient > 75%;Moderate Assistance - Patient 50-74% Sit to Supine: Moderate Assistance - Patient 50-74%;Minimal Assistance - Patient > 75% Transfers Transfers: Sit to Stand;Stand to Sit;Stand Pivot Transfers Sit to Stand: Moderate Assistance - Patient 50-74% Stand to Sit: Moderate Assistance - Patient 50-74% Stand Pivot Transfers: Moderate Assistance - Patient 50 - 74% Stand Pivot Transfer Details: Verbal cues for precautions/safety;Verbal cues for sequencing;Verbal cues for technique;Verbal cues for gait pattern;Manual facilitation for weight shifting;Manual facilitation for placement;Tactile cues for sequencing;Tactile cues for weight shifting Transfer (Assistive device): 1 person hand held assist Locomotion  Gait Ambulation: Yes Gait Assistance: Moderate Assistance - Patient 50-74% Gait Distance (Feet): 73 Feet (+ 30ft using L UE support around therapist's shoulder and R HHA across his body (increased L knee instability without RW but allowed therapist improved ability to weight shift)) Assistive device: Rolling walker Gait Assistance Details: Tactile cues for sequencing;Tactile  cues for placement;Tactile cues for weight shifting;Manual facilitation for weight shifting;Manual facilitation for placement;Verbal cues for safe use of DME/AE;Verbal cues for precautions/safety;Verbal cues for sequencing;Verbal cues for technique;Verbal cues for gait pattern;Visual cues/gestures for sequencing;Tactile cues for initiation Gait Gait: Yes Gait Pattern: Impaired Gait Pattern: Poor foot clearance - left;Poor foot clearance - right;Decreased stance time - right;Decreased step length - left;Decreased stride length;Decreased hip/knee flexion - left (lacks R weight shift onto R stance limb, poor L LE foot clearance, excessive thoracic kyphosis) Stairs / Additional Locomotion Stairs: Yes Stairs Assistance: Maximal Assistance - Patient 25 - 49% (requires manual faciltiation to step L LE and block L knee during descent - worsening L lean with crouched posture towards end with fatigue) Stair Management Technique: Two  rails;Step to pattern Number of Stairs: 4 Height of Stairs: 6 Wheelchair Mobility Wheelchair Mobility: No   Discharge Criteria: Patient will be discharged from PT if patient refuses treatment 3 consecutive times without medical reason, if treatment goals not met, if there is a change in medical status, if patient makes no progress towards goals or if patient is discharged from hospital.  The above assessment, treatment plan, treatment alternatives and goals were discussed and mutually agreed upon: by patient and by family  Ginny Forth , PT, DPT, NCS, CSRS 04/13/2023, 2:51 PM

## 2023-04-13 NOTE — Evaluation (Addendum)
Occupational Therapy Assessment and Plan  Patient Details  Name: Samuel Mata MRN: [id number] Date of Birth: Sep 20, 1930  OT Diagnosis: ataxia, cognitive deficits, hemiplegia affecting non-dominant side, and muscle weakness (generalized) Rehab Potential: Rehab Potential (ACUTE ONLY): Good ELOS: 2-2.5 weeks   Today's Date: 04/13/2023 OT Individual Time: 4098-1191 OT Individual Time Calculation (min): 73 min     Hospital Problem: Principal Problem:   Right thalamic stroke [facility])   Past Medical History:  Past Medical History:  Diagnosis Date   Bronchiectasis without complication (HCC)    Enlarged prostate    Hearing loss    Hypertension    Pulmonary nocardiosis (HCC) 2021   treated with 6 month antibiotic course   Renal stones    Stroke (HCC) 2022   left frontoparietal   TIA (transient ischemic attack) 2019   Past Surgical History:  Past Surgical History:  Procedure Laterality Date   APPENDECTOMY     CHOLECYSTECTOMY     LOOP RECORDER INSERTION N/A 01/15/2021   Procedure: LOOP RECORDER INSERTION;  Surgeon: Hillis Range, MD;  Location: [facility] INV[facility];  Service: Cardiovascular;  Laterality: N/A;   NOSE SURGERY     SPINE SURGERY     compressoin fx T12, kyphoplasty   SPLENECTOMY, TOTAL      Assessment & Plan Clinical Impression: Samuel Mata is an 89 year old R handed male with history of HTN, COPD, Left frontoparietal stroke '22 s/p loop recorder who was admitted on 04/07/23 with reports progressive LLE weakness X 24 hours as well as gradual cognitive decline. CTA negative for LVO, moderate bilateral V1 stenosis and multifocal patchy nodular density in visualized lungs. MRI brain done revealing 9 mm acute ischemic right thalamocapsular infarct with moderate chronic microvascular ischemic disease. 2D echo showed EF 55-60% with mild MVR, negative bubble study and no wall abnormality. Loop recorder without A fib. Dr. Pearlean Brownie felt that stroke was due to small vessel disease and  recommended DAPT X 3 weeks followed by Plavix alone. Yesterday evening, patient with LUE weakness/pronator drift and CT head repeated showing evolution of stroke and negative for bleed. Therapy has been working with patient who is limited by mild left sided neglect, left lean, left flank pain, balance deficits and flexed posture  Patient transferred to CIR on 04/12/2023 .    Patient currently requires mod with basic self-care skills secondary to muscle weakness, decreased cardiorespiratoy endurance, impaired timing and sequencing, unbalanced muscle activation, ataxia, decreased coordination, and decreased motor planning, decreased attention to left and decreased motor planning, decreased initiation, decreased attention, decreased awareness, decreased problem solving, and decreased safety awareness, central origin, and decreased sitting balance, decreased standing balance, decreased postural control, hemiplegia, and decreased balance strategies.  Prior to hospitalization, patient could complete BADLs with modified independent .  Patient will benefit from skilled intervention to decrease level of assist with basic self-care skills and increase independence with basic self-care skills prior to discharge home with care partner.  Anticipate patient will require 24 hour supervision and follow up home health.  OT - End of Session Activity Tolerance: Decreased this session Endurance Deficit: Yes OT Assessment Rehab Potential (ACUTE ONLY): Good OT Patient demonstrates impairments in the following area(s): Balance;Perception;Safety;Behavior;Cognition;Skin Integrity;Endurance;Motor OT Basic ADL's Functional Problem(s): Toileting;Dressing;Bathing;Grooming OT Transfers Functional Problem(s): Tub/Shower;Toilet OT Additional Impairment(s): Fuctional Use of Upper Extremity OT Plan OT Intensity: Minimum of 1-2 x/day, 45 to 90 minutes OT Frequency: 5 out of 7 days OT Duration/Estimated Length of Stay: 2-2.5  weeks OT Treatment/Interventions: Balance/vestibular training;Discharge planning;Functional electrical  stimulation;Self Care/advanced ADL retraining;Pain management;Therapeutic Activities;UE/LE Coordination activities;Cognitive remediation/compensation;Disease mangement/prevention;Functional mobility training;Patient/family education;Skin care/wound managment;Therapeutic Exercise;Visual/perceptual remediation/compensation;Community reintegration;Neuromuscular re-education;DME/adaptive equipment instruction;Psychosocial support;Splinting/orthotics;Wheelchair propulsion/positioning;UE/LE Strength taining/ROM OT Self Feeding Anticipated Outcome(s): Set-up OT Basic Self-Care Anticipated Outcome(s): Supervision-CGA OT Toileting Anticipated Outcome(s): Supervision OT Bathroom Transfers Anticipated Outcome(s): Supervision-CGA OT Recommendation Recommendations for Other Services: Therapeutic Recreation consult Therapeutic Recreation Interventions: Pet therapy Patient destination: Home Follow Up Recommendations: Home health OT Equipment Recommended: To be determined Equipment Details: Pt owns A TTB, SPC, and BSC   OT Evaluation Precautions/Restrictions  Precautions Precautions: Fall Precaution Comments: HOH, sundowning Restrictions Weight Bearing Restrictions: No General Chart Reviewed: Yes Additional Pertinent History: COPD, CVA 2022 Family/Caregiver Present: Yes (DTR, Verlon Au) Vital Signs   Pain Pain Assessment Pain Scale: 0-10 Pain Score: 0-No pain Home Living/Prior Functioning Home Living Family/patient expects to be discharged to:: Private residence Living Arrangements: Spouse/significant other Available Help at Discharge: Family, Available 24 hours/day (wife is only able to provide supervision) Type of Home: House Home Access: Stairs to enter [employer] of Steps: 2 Entrance Stairs-Rails: None Home Layout: One level Bathroom Shower/Tub: Therapist, art: Standard Bathroom Accessibility: Yes  Lives With: Spouse (lives with wife, DTR lives close by) IADL History Homemaking Responsibilities: Yes Meal Prep Responsibility: Secondary Laundry Responsibility: Secondary Cleaning Responsibility: Secondary Bill Paying/Finance Responsibility: Secondary Shopping Responsibility: No Child Care Responsibility: No Current License: Yes (License expires in December and he is not planning on renewing it) Occupation: Retired Prior Function Level of Independence: Requires assistive device for independence, Independent with basic ADLs, Independent with homemaking with ambulation (use SPC)  Able to Take Stairs?: Yes Driving: Yes (short distancing, he is not driving at d/c) Vocation: Retired Administrator, sports Baseline Vision/History: 1 Wears glasses Ability to See in Adequate Light: 1 Impaired Patient Visual Report: No change from baseline Vision Assessment?: Yes Eye Alignment: Within Functional Limits Ocular Range of Motion: Within Functional Limits Alignment/Gaze Preference: Within Defined Limits Tracking/Visual Pursuits: Able to track stimulus in all quads without difficulty Saccades: Within functional limits Convergence: Within functional limits Visual Fields: No apparent deficits Depth Perception: Undershoots Additional Comments: dysmetria with L UE Perception  Perception: Impaired Perception-Other Comments: L side attention Praxis Praxis: Impaired Praxis Impairment Details: Motor planning Cognition Cognition Overall Cognitive Status: Impaired/Different from baseline Arousal/Alertness: Awake/alert Orientation Level: Person;Situation Memory: Impaired Memory Impairment: Storage deficit;Decreased short term memory;Retrieval deficit Decreased Short Term Memory: Verbal basic;Functional basic Attention: Focused;Sustained;Selective Focused Attention: Appears intact Sustained Attention: Impaired (very easily internally and externally  distracted) Selective Attention: Impaired Selective Attention Impairment: Functional basic Awareness: Impaired Awareness Impairment: Emergent impairment Problem Solving: Impaired Safety/Judgment: Impaired Comments: mild impairment d/t problem solving and awareness deficit Brief Interview for Mental Status (BIMS) Repetition of Three Words (First Attempt): 3 Temporal Orientation: Year: Correct Temporal Orientation: Month: Accurate within 5 days Temporal Orientation: Day: Correct Recall: "Sock": Yes, no cue required Recall: "Blue": Yes, after cueing ("a color") Recall: "Bed": Yes, no cue required BIMS Summary Score: 14 Sensation Sensation Light Touch: Appears Intact Hot/Cold: Appears Intact Proprioception: Impaired by gross assessment Stereognosis: Not tested Coordination Gross Motor Movements are Fluid and Coordinated: No Fine Motor Movements are Fluid and Coordinated: No Finger Nose Finger Test: decreased speed of motor movements with dysmetria with L UE Motor  Motor Motor: Hemiplegia;Ataxia Motor - Skilled Clinical Observations: Mild L-side strength and mild ataxia in L UE  Trunk/Postural Assessment  Cervical Assessment Cervical Assessment: Exceptions to WFL (midl forward head) Thoracic Assessment Thoracic Assessment: Exceptions to [facility] (rounded shoudlers) Lumbar Assessment Lumbar Assessment: Exceptions  to [facility] (posterior pelvic tilt) Postural Control Postural Control: Deficits on evaluation Trunk Control: decreased Righting Reactions: delayed  Balance Balance Balance Assessed: Yes Static Sitting Balance Static Sitting - Balance Support: Feet supported Static Sitting - Level of Assistance: 4: Min assist (CGA-min A) Dynamic Sitting Balance Dynamic Sitting - Balance Support: Feet supported Dynamic Sitting - Level of Assistance: 4: Min assist;3: Mod assist Static Standing Balance Static Standing - Balance Support: During functional activity;Bilateral upper extremity  supported Static Standing - Level of Assistance: 4: Min assist Dynamic Standing Balance Dynamic Standing - Balance Support: During functional activity Dynamic Standing - Level of Assistance: 3: Mod assist Extremity/Trunk Assessment RUE Assessment RUE Assessment: Within Functional Limits LUE Assessment LUE Assessment: Exceptions to [facility] General Strength Comments: 4-/5 overall  Care Tool Care Tool Self Care Eating   Eating Assist Level: Supervision/Verbal cueing    Oral Care    Oral Care Assist Level: Supervision/Verbal cueing    Bathing   Body parts bathed by patient: Right arm;Left arm;Chest;Face;Front perineal area;Abdomen;Right upper leg;Left upper leg Body parts bathed by helper: Front perineal area;Buttocks;Left lower leg;Right lower leg;Left upper leg;Right upper leg   Assist Level: Moderate Assistance - Patient 50 - 74%    Upper Body Dressing(including orthotics)   What is the patient wearing?: Pull over shirt   Assist Level: Moderate Assistance - Patient 50 - 74%    Lower Body Dressing (excluding footwear)   What is the patient wearing?: Pants Assist for lower body dressing: Moderate Assistance - Patient 50 - 74%    Putting on/Taking off footwear   What is the patient wearing?: Socks;Shoes Assist for footwear: Minimal Assistance - Patient > 75%       Care Tool Toileting Toileting activity   Assist for toileting: Moderate Assistance - Patient 50 - 74%     Care Tool Bed Mobility Roll left and right activity   Roll left and right assist level: Minimal Assistance - Patient > 75%    Sit to lying activity   Sit to lying assist level: Moderate Assistance - Patient 50 - 74%    Lying to sitting on side of bed activity   Lying to sitting on side of bed assist level: the ability to move from lying on the back to sitting on the side of the bed with no back support.: Moderate Assistance - Patient 50 - 74%     Care Tool Transfers Sit to stand transfer   Sit to stand  assist level: Moderate Assistance - Patient 50 - 74%    Chair/bed transfer         Toilet transfer   Assist Level: Moderate Assistance - Patient 50 - 74%     Care Tool Cognition  Expression of Ideas and Wants Expression of Ideas and Wants: 4. Without difficulty (complex and basic) - expresses complex messages without difficulty and with speech that is clear and easy to understand  Understanding Verbal and Non-Verbal Content Understanding Verbal and Non-Verbal Content: 3. Usually understands - understands most conversations, but misses some part/intent of message. Requires cues at times to understand   Memory/Recall Ability Memory/Recall Ability : That he or she is in a hospital/hospital unit;Current season   Refer to Care Plan for Long Term Goals  SHORT TERM GOAL WEEK 1 OT Short Term Goal 1 (Week 1): Pt will perform sit<>stands using LRAD min A consistently OT Short Term Goal 2 (Week 1): Pt will maintain standing balance during BADLs min A OT Short Term Goal 3 (  Week 1): Pt will maintain sitting balance during BADLs CGA to decrease overall bourdon of care OT Short Term Goal 4 (Week 1): Pt will complete LB dressing min A using LRAD  Recommendations for other services: Therapeutic Recreation  Pet therapy   Skilled Therapeutic Intervention Skilled Therapeutic Interventions/Progress Updates:  1:1 OT evaluation and intervention initiated with skilled education provided on OT role, goals, and POC. Pt received sitting up in bed presenting to be in good spirits receptive to skilled OT session reporting 0/10 pain- OT offering intermittent rest breaks, repositioning, and therapeutic support to optimize participation in therapy session. Pt completed BADLs at levels completed below this session. Pt presenting with heavy posterior and L lean during functional transfers and BADLs, motor planning challenges, and decreased L side attention limiting his awareness in BADLs. Pt was left resting in bed with  call bell in reach, bed alarm on, and all needs met.   ADL ADL Eating: Supervision/safety;Set up Where Assessed-Eating: Bed level Grooming: Minimal assistance Where Assessed-Grooming: Bed level Upper Body Bathing: Moderate assistance Where Assessed-Upper Body Bathing: Edge of bed Lower Body Bathing: Moderate assistance Where Assessed-Lower Body Bathing: Edge of bed Upper Body Dressing: Moderate assistance Where Assessed-Upper Body Dressing: Edge of bed Lower Body Dressing: Moderate assistance Where Assessed-Lower Body Dressing: Edge of bed Toileting: Moderate assistance Where Assessed-Toileting: Toilet;Bedside Commode (BSC over toilet) Toilet Transfer: Moderate assistance Toilet Transfer Method: Proofreader: Grab bars;Bedside commode Tub/Shower Transfer: Not assessed Walk-In Shower Transfer: Not assessed Mobility  Bed Mobility Bed Mobility: Supine to Sit;Sit to Supine Supine to Sit: Minimal Assistance - Patient > 75%;Moderate Assistance - Patient 50-74% Sit to Supine: Moderate Assistance - Patient 50-74%;Minimal Assistance - Patient > 75% Transfers Sit to Stand: Moderate Assistance - Patient 50-74% Stand to Sit: Moderate Assistance - Patient 50-74%   Discharge Criteria: Patient will be discharged from OT if patient refuses treatment 3 consecutive times without medical reason, if treatment goals not met, if there is a change in medical status, if patient makes no progress towards goals or if patient is discharged from hospital.  The above assessment, treatment plan, treatment alternatives and goals were discussed and mutually agreed upon: by patient and by family  [name] 04/13/2023, 12:48 PM

## 2023-04-13 NOTE — Plan of Care (Signed)
Had a fairly quite night. Confused most times after 1am in the morning. Had to stay in the room to keep him occupied and to re-orient. Remains continent of B/B. Took of condom cath (Present with admission). Family informed staff yesterday of patient been confused overnight. Denies pain. Assisted with BR request. Safety maintained.  Problem: Consults Goal: RH STROKE PATIENT EDUCATION Description: See Patient Education module for education specifics  Outcome: Progressing   Problem: RH BOWEL ELIMINATION Goal: RH STG MANAGE BOWEL WITH ASSISTANCE Description: STG Manage Bowel with mod I Assistance. Outcome: Progressing Goal: RH STG MANAGE BOWEL W/MEDICATION W/ASSISTANCE Description: STG Manage Bowel with Medication with mod I Assistance. Outcome: Progressing   Problem: RH BLADDER ELIMINATION Goal: RH STG MANAGE BLADDER WITH ASSISTANCE Description: STG Manage Bladder With mod I Assistance Outcome: Progressing Goal: RH STG MANAGE BLADDER WITH MEDICATION WITH ASSISTANCE Description: STG Manage Bladder With Medication With mod I Assistance. Outcome: Progressing   Problem: RH SAFETY Goal: RH STG ADHERE TO SAFETY PRECAUTIONS W/ASSISTANCE/DEVICE Description: STG Adhere to Safety Precautions With cues Assistance/Device. Outcome: Progressing   Problem: RH PAIN MANAGEMENT Goal: RH STG PAIN MANAGED AT OR BELOW [name] PAIN GOAL Description: < 4 with prns Outcome: Progressing   Problem: RH KNOWLEDGE DEFICIT Goal: RH STG INCREASE KNOWLEDGE OF HYPERTENSION Description: Patient and wife will be able to manage HTN with medications and dietary modification using educational resources independently Outcome: Progressing Goal: RH STG INCREASE KNOWLEGDE OF HYPERLIPIDEMIA Description: Patient and wife will be able to manage HLD with medications and dietary modification using educational resources independently Outcome: Progressing Goal: RH STG INCREASE KNOWLEDGE OF STROKE PROPHYLAXIS Description: Patient  and wife will be able to manage secondary risks with medications and dietary modification using educational resources independently Outcome: Progressing

## 2023-04-13 NOTE — Plan of Care (Signed)
  Problem: RH Balance Goal: LTG Patient will maintain dynamic sitting balance (PT) Description: LTG:  Patient will maintain dynamic sitting balance with assistance during mobility activities (PT) Flowsheets (Taken 04/13/2023 1529) LTG: Pt will maintain dynamic sitting balance during mobility activities with:: Supervision/Verbal cueing Goal: LTG Patient will maintain dynamic standing balance (PT) Description: LTG:  Patient will maintain dynamic standing balance with assistance during mobility activities (PT) Flowsheets (Taken 04/13/2023 1529) LTG: Pt will maintain dynamic standing balance during mobility activities with:: Contact Guard/Touching assist   Problem: Sit to Stand Goal: LTG:  Patient will perform sit to stand with assistance level (PT) Description: LTG:  Patient will perform sit to stand with assistance level (PT) Flowsheets (Taken 04/13/2023 1529) LTG: PT will perform sit to stand in preparation for functional mobility with assistance level: Contact Guard/Touching assist   Problem: RH Bed Mobility Goal: LTG Patient will perform bed mobility with assist (PT) Description: LTG: Patient will perform bed mobility with assistance, with/without cues (PT). Flowsheets (Taken 04/13/2023 1529) LTG: Pt will perform bed mobility with assistance level of: Supervision/Verbal cueing   Problem: RH Bed to Chair Transfers Goal: LTG Patient will perform bed/chair transfers w/assist (PT) Description: LTG: Patient will perform bed to chair transfers with assistance (PT). Flowsheets (Taken 04/13/2023 1529) LTG: Pt will perform Bed to Chair Transfers with assistance level: Contact Guard/Touching assist   Problem: RH Car Transfers Goal: LTG Patient will perform car transfers with assist (PT) Description: LTG: Patient will perform car transfers with assistance (PT). Flowsheets (Taken 04/13/2023 1529) LTG: Pt will perform car transfers with assist:: Contact Guard/Touching assist   Problem: RH  Ambulation Goal: LTG Patient will ambulate in controlled environment (PT) Description: LTG: Patient will ambulate in a controlled environment, # of feet with assistance (PT). Flowsheets (Taken 04/13/2023 1531) LTG: Pt will ambulate in controlled environ  assist needed:: Contact Guard/Touching assist LTG: Ambulation distance in controlled environment: 14ft using LRAD   Problem: RH Stairs Goal: LTG Patient will ambulate up and down stairs w/assist (PT) Description: LTG: Patient will ambulate up and down # of stairs with assistance (PT) Flowsheets (Taken 04/13/2023 1531) LTG: Pt will ambulate up/down stairs assist needed:: Contact Guard/Touching assist LTG: Pt will  ambulate up and down number of stairs: 1 + 1 step-up using support per home set-up   Problem: RH Ambulation Goal: LTG Patient will ambulate in home environment (PT) Description: LTG: Patient will ambulate in home environment, # of feet with assistance (PT). Flowsheets (Taken 04/13/2023 1531) LTG: Pt will ambulate in home environ  assist needed:: Contact Guard/Touching assist LTG: Ambulation distance in home environment: 3ft using LRAD

## 2023-04-13 NOTE — Progress Notes (Signed)
Inpatient Rehabilitation Care Coordinator Assessment and Plan Patient Details  Name: Samuel Mata MRN: [id number] Date of Birth: September 08, 1930  Today's Date: 04/13/2023  Hospital Problems: Principal Problem:   Right thalamic stroke [facility])  Past Medical History:  Past Medical History:  Diagnosis Date   Bronchiectasis without complication (HCC)    Enlarged prostate    Hearing loss    Hypertension    Pulmonary nocardiosis (HCC) 2021   treated with 6 month antibiotic course   Renal stones    Stroke (HCC) 2022   left frontoparietal   TIA (transient ischemic attack) 2019   Past Surgical History:  Past Surgical History:  Procedure Laterality Date   APPENDECTOMY     CHOLECYSTECTOMY     LOOP RECORDER INSERTION N/A 01/15/2021   Procedure: LOOP RECORDER INSERTION;  Surgeon: Hillis Range, MD;  [facility]: [facility];  Service: Cardiovascular;  Laterality: N/A;   NOSE SURGERY     SPINE SURGERY     compressoin fx T12, kyphoplasty   SPLENECTOMY, TOTAL     Social History:  reports that he quit smoking about 38 years ago. His smoking use included cigarettes. He started smoking about 68 years ago. He has a 30 pack-year smoking history. He has never used smokeless tobacco. He reports that he does not drink alcohol and does not use drugs.  Family / Support Systems Marital Status: Married How Long?: n/a Patient Roles: Spouse Spouse/Significant Other: Royanne Foots, spouse Children: Scientist, water quality, Daughter Other Supports: daughter (lives in [place]) Anticipated Caregiver: Network engineer, spouse (supervision only) Ability/Limitations of Caregiver: SUPERVISON ONLY Caregiver Availability: 24/7 Family Dynamics: support from spouse and daughters (1 lives in [place] and 1 location dtr travels often)  Social History Preferred language: English Religion: [employer] Cultural Background: n/a Education: hs Health Literacy - How often do you need to have someone help you when you read instructions,  pamphlets, or other written material from your doctor or pharmacy?: Never Employment Status: Retired Marine scientist Issues: n/a Guardian/Conservator: n/a   Abuse/Neglect Abuse/Neglect Assessment Can Be Completed: Yes Physical Abuse: Denies Verbal Abuse: Denies Sexual Abuse: Denies Exploitation of patient/patient's resources: Denies Self-Neglect: Denies  Patient response to: Social Isolation - How often do you feel lonely or isolated from those around you?: Never  Emotional Status [name] affect, behavior and adjustment status: pleasant Recent Psychosocial Issues: coping Psychiatric History: n/a Substance Abuse History: n/a  Patient / Family Perceptions, Expectations & Goals Pt/Family understanding of illness & functional limitations: yes, spoke with pt and daughter at bedside. Premorbid pt/family roles/activities: Independent overall prior. Sponge baths primarily. Anticipated changes in roles/activities/participation: Spouse able to provide supervision only as spouse uses RW. 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 Agencies: None Premorbid Home Care/DME Agencies: Other (Comment) Animal nutritionist, SPC,BSC) Transportation available at discharge: spouse Is the patient able to respond to transportation needs?: Yes In the past 12 months, has lack of transportation kept you from medical appointments or from getting medications?: No In the past 12 months, has lack of transportation kept you from meetings, work, or from getting things needed for daily living?: No Resource referrals recommended: Neuropsychology  Discharge Planning Living Arrangements: Spouse/significant other Support Systems: Spouse/significant other Type of Residence: Private residence (1 level home, 2 steps) Insurance Resources: Media planner (specify) Radiographer, therapeutic) Financial Resources: Restaurant manager, fast food Screen Referred: No Living Expenses: Own Money  Management: Patient, Spouse Does the patient have any problems obtaining your medications?: No Home Management: Independent Patient/Family Preliminary Plans: Plans to conintue Care Coordinator Barriers to Discharge:  Lack of/limited family support, Decreased caregiver support Care Coordinator Anticipated Follow Up Needs: HH/OP Expected length of stay: 7-10 Days  Clinical Impression SW met with patient and daughter in the room, introduced self and explained role. Patient discharge home with spouse able to provide SUPERVISION ONLY as spouse uses the RW. Daughter that is currently present is here from [place] and plans to return home tomorrow. [name] local daughter will be present on Monday. Daughter who is local travels often. SW will meet local daughter on  Monday. SW provided contact information to daughter here from [place]. Patient retuning to a 1 level home, 2 steps to enter. Pt currently has a shower seat, SPC and Bsc. No additional questions or concerns.  [name] 04/13/2023, 2:30 PM

## 2023-04-13 NOTE — Progress Notes (Signed)
Inpatient Rehabilitation Admission Medication Review by a Pharmacist  A complete drug regimen review was completed for this patient to identify any potential clinically significant medication issues.  High Risk Drug Classes Is patient taking? Indication by Medication  Antipsychotic Yes Prochlorperazine - nausea  Anticoagulant Yes Enoxaparin - VTE prophylaxis   Antibiotic No   Opioid No   Antiplatelet Yes Aspirin, clopidogrel - stroke prophylaxis  Hypoglycemics/insulin No   Vasoactive Medication Yes Metoprolol - HTN  Chemotherapy No   Other Yes Tamsulosin - BPH Rosuvastatin - HLD Famotidine - GERD Fluticasone nasal - prn rhinitis  Nitroglycerin - prn chest pain     Type of Medication Issue Identified Description of Issue Recommendation(s)  Drug Interaction(s) (clinically significant)     Duplicate Therapy     Allergy     No Medication Administration End Date  Aspirin + clopidogrel for 3 weeks then clopidogrel alone Last dose aspirin is 12/12  Incorrect Dose     Additional Drug Therapy Needed     Significant med changes from prior encounter (inform family/care partners about these prior to discharge). PTA medication not resumed: atorvastatin (changed to rosuvastatin), metoprolol XL (changed to metoprolol IR)   Other       Clinically significant medication issues were identified that warrant physician communication and completion of prescribed/recommended actions by midnight of the next day:  No  Name of provider notified for urgent issues identified:   Provider Method of Notification:     Pharmacist comments:   Time spent performing this drug regimen review (minutes):  20

## 2023-04-13 NOTE — Evaluation (Addendum)
Speech Language Pathology Assessment and Plan  Patient Details  Name: Samuel Mata MRN: [id number] Date of Birth: 11/29/1930  SLP Diagnosis: Cognitive Impairments  Rehab Potential: Excellent ELOS: 14-16 days    Today's Date: 04/13/2023 SLP Individual Time: 1100-1200 SLP Individual Time Calculation (min): 60 min   Hospital Problem: Principal Problem:   Right thalamic stroke [facility])  Past Medical History:  Past Medical History:  Diagnosis Date   Bronchiectasis without complication (HCC)    Enlarged prostate    Hearing loss    Hypertension    Pulmonary nocardiosis (HCC) 2021   treated with 6 month antibiotic course   Renal stones    Stroke (HCC) 2022   left frontoparietal   TIA (transient ischemic attack) 2019   Past Surgical History:  Past Surgical History:  Procedure Laterality Date   APPENDECTOMY     CHOLECYSTECTOMY     LOOP RECORDER INSERTION N/A 01/15/2021   Procedure: LOOP RECORDER INSERTION;  Surgeon: Hillis Range, MD;  [facility]: [facility] LAB;  Service: Cardiovascular;  Laterality: N/A;   NOSE SURGERY     SPINE SURGERY     compressoin fx T12, kyphoplasty   SPLENECTOMY, TOTAL      Assessment / Plan / Recommendation Clinical Impression HPI: Samuel Mata is a 89 y.o. male with medical history significant of COPD, hypertension, GERD, hereditary spherocytosis, sleep apnea who presented to [facility] Emergency Department on 04/07/22  with left leg weakness.  CT of the head showed no acute intracranial abnormality. Small remote cerebellar infarct.    CTA head & neck showed no LVO and Moderate bilateral V1 stenosis.  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   Clinical Impression:  Patient was evaluated via the Cognistat to assess cognitive-linguistic functioning. Patient scored WFL on all subtests with the exception of mild deficits in memory and visual spatial construction. Visual tasks score  impacted due to increased time needed to complete task, likely due to processing speed. Patient with ability to sustain attention during ST evaluation and able to recall cognitive and physical changes indicative of functional intellectual awareness skills. Recommend targeting STM and processing speed during inpatient stay. Patient may benefit from further evaluation regarding problem solving due to high level of cognitive load prior to admission.  Pt would benefit from skilled ST services to maximize cognition in order to maximize functional independence at d/c. Anticipate patient will require 24 hour supervision at d/c and no f/u SLP at this time services.    Skilled Therapeutic Interventions          Patient evaluated using a non-standardized cognitive linguistic assessment and bedside swallow evaluation to assess current cognitive, communicative and swallowing function. See above for details.    SLP Assessment  Patient will need skilled Speech Lanaguage Pathology Services during CIR admission    Recommendations  Patient destination: Home Follow up Recommendations: None Equipment Recommended: None recommended by SLP    SLP Frequency 1 to 3 out of 7 days   SLP Duration  SLP Intensity  SLP Treatment/Interventions 14-16 days  Minumum of 1-2 x/day, 30 to 90 minutes  Cognitive remediation/compensation;Internal/external aids;Therapeutic Activities;Functional tasks;Patient/family education;Therapeutic Exercise    Pain None reported   SLP Evaluation Cognition Overall Cognitive Status: Impaired/Different from baseline Arousal/Alertness: Awake/alert Orientation Level: Oriented X4 Year: 2024 Month: November Day of Week: Correct Memory: Impaired Memory Impairment: Storage deficit;Decreased short term memory;Retrieval deficit Decreased Short Term Memory: Verbal basic;Functional basic Awareness: Appears intact Problem Solving:  Appears intact  Comprehension Auditory Comprehension Overall  Auditory Comprehension: Appears within functional limits for tasks assessed Expression Expression Primary Mode of Expression: Verbal Verbal Expression Overall Verbal Expression: Appears within functional limits for tasks assessed Written Expression Dominant Hand: Right Oral Motor Oral Motor/Sensory Function Overall Oral Motor/Sensory Function: Within functional limits Motor Speech Overall Motor Speech: Appears within functional limits for tasks assessed  Care Tool Care Tool Cognition Ability to hear (with hearing aid or hearing appliances if normally used Ability to hear (with hearing aid or hearing appliances if normally used): 2. Moderate difficulty - speaker has to increase volume and speak distinctly   Expression of Ideas and Wants Expression of Ideas and Wants: 4. Without difficulty (complex and basic) - expresses complex messages without difficulty and with speech that is clear and easy to understand   Understanding Verbal and Non-Verbal Content Understanding Verbal and Non-Verbal Content: 3. Usually understands - understands most conversations, but misses some part/intent of message. Requires cues at times to understand  Memory/Recall Ability Memory/Recall Ability : That he or she is in a hospital/hospital unit;Current season  Short Term Goals: Week 1: SLP Short Term Goal 1 (Week 1): Patient will recall and utilize memory compensatory strategies given min multimodal A SLP Short Term Goal 2 (Week 1): Patient will increase processing speed during functional tasks given min multimodal A  Refer to Care Plan for Long Term Goals  Recommendations for other services: None   Discharge Criteria: Patient will be discharged from SLP if patient refuses treatment 3 consecutive times without medical reason, if treatment goals not met, if there is a change in medical status, if patient makes no progress towards goals or if patient is discharged from hospital.  The above assessment, treatment  plan, treatment alternatives and goals were discussed and mutually agreed upon: by patient  [name] M.A., CF-SLP 04/13/2023, 12:20 PM

## 2023-04-13 NOTE — Progress Notes (Signed)
Inpatient Rehabilitation  Patient information reviewed and entered into eRehab system by Oyuki Hogan M. Eri Mcevers, M.A., CCC/SLP, PPS Coordinator.  Information including medical coding, functional ability and quality indicators will be reviewed and updated through discharge.    

## 2023-04-13 NOTE — Patient Care Conference (Signed)
Inpatient RehabilitationTeam Conference and Plan of Care Update Date: 04/13/2023   Time: 10:44 AM    Patient Name: Samuel Mata      Medical Record Number: [id number]  Date of Birth: 1931/04/12 Sex: Male         Room/Bed: 4W11C/4W11C-01 Payor Info: Payor: [employer] MEDICARE / Plan: Monia Pouch MEDICARE HMO/PPO / Product Type: *No Product type* /    Admit Date/Time:  04/12/2023  6:27 PM  Primary Diagnosis:  Right thalamic stroke [facility])  Hospital Problems: Principal Problem:   Right thalamic stroke [facility])    Expected Discharge Date: Expected Discharge Date:  (evals pending)  Team Members Present: Physician leading conference: Dr. Claudette Laws Social Worker Present: Lavera Guise, BSW Nurse Present: Chana Bode, RN PT Present: Casimiro Needle, PT OT Present: Bonnell Public, OT SLP Present: Everardo Pacific, SLP PPS Coordinator present : Fae Pippin, SLP     Current Status/Progress Goal Weekly Team Focus  Bowel/Bladder   continent of B/B. LBM 04/10/2023   Maintain Continence   Assist with BR needs    Swallow/Nutrition/ Hydration   eval pending           ADL's   Eval pending   Eval pending   Eval pending    Mobility   eval pending           Communication   eval pending            Safety/Cognition/ Behavioral Observations  eval pending            Pain   Denies pain   Remain pain free   Assess Q4 and prn    Skin   Grossly intact   Maintain integrity  Assess QS and prn.      Discharge Planning:    Home with wife who can provide supervision only; dtr to assist.  Team Discussion: Patient post right thalamic CVA with in-coordination left side and left inattention with posterior leaning and sundowning.  Patient on target to meet rehab goals: Evals pending. Needed mod assist for ADLs.  *See Care Plan and progress notes for long and short-term goals.   Revisions to Treatment Plan:  N/a   Teaching Needs: Safety, medications, dietary  modification, transfers, toileting, etc.   Current Barriers to Discharge: Decreased caregiver support and Home enviroment access/layout  Possible Resolutions to Barriers: Family education HH follow up services     Medical Summary Current Status: Sundowning last night , reduced sitting balance adn coordination LUE  Barriers to Discharge: Uncontrolled Hypertension   Possible Resolutions to [employer] Focus: may need to start additional antihypertensive med   Continued Need for Acute Rehabilitation Level of Care: The patient requires daily medical management by a physician with specialized training in physical medicine and rehabilitation for the following reasons: Direction of a multidisciplinary physical rehabilitation program to maximize functional independence : Yes Medical management of patient stability for increased activity during participation in an intensive rehabilitation regime.: Yes Analysis of laboratory values and/or radiology reports with any subsequent need for medication adjustment and/or medical intervention. : Yes   I attest that I was present, lead the team conference, and concur with the assessment and plan of the team.   Chana Bode B 04/13/2023, 1:51 PM

## 2023-04-13 NOTE — Plan of Care (Signed)
Problem: RH Balance Goal: LTG: Patient will maintain dynamic sitting balance (OT) Description: LTG:  Patient will maintain dynamic sitting balance with assistance during activities of daily living (OT) Flowsheets (Taken 04/13/2023 1521) LTG: Pt will maintain dynamic sitting balance during ADLs with: Supervision/Verbal cueing Goal: LTG Patient will maintain dynamic standing with ADLs (OT) Description: LTG:  Patient will maintain dynamic standing balance with assist during activities of daily living (OT)  Flowsheets (Taken 04/13/2023 1521) LTG: Pt will maintain dynamic standing balance during ADLs with: Contact Guard/Touching assist   Problem: Sit to Stand Goal: LTG:  Patient will perform sit to stand in prep for activites of daily living with assistance level (OT) Description: LTG:  Patient will perform sit to stand in prep for activites of daily living with assistance level (OT) Flowsheets (Taken 04/13/2023 1521) LTG: PT will perform sit to stand in prep for activites of daily living with assistance level: Contact Guard/Touching assist   Problem: RH Grooming Goal: LTG Patient will perform grooming w/assist,cues/equip (OT) Description: LTG: Patient will perform grooming with assist, with/without cues using equipment (OT) Flowsheets (Taken 04/13/2023 1521) LTG: Pt will perform grooming with assistance level of: Supervision/Verbal cueing   Problem: RH Bathing Goal: LTG Patient will bathe all body parts with assist levels (OT) Description: LTG: Patient will bathe all body parts with assist levels (OT) Flowsheets (Taken 04/13/2023 1521) LTG: Pt will perform bathing with assistance level/cueing: Contact Guard/Touching assist   Problem: RH Dressing Goal: LTG Patient will perform upper body dressing (OT) Description: LTG Patient will perform upper body dressing with assist, with/without cues (OT). Flowsheets (Taken 04/13/2023 1521) LTG: Pt will perform upper body dressing with assistance  level of: Supervision/Verbal cueing Goal: LTG Patient will perform lower body dressing w/assist (OT) Description: LTG: Patient will perform lower body dressing with assist, with/without cues in positioning using equipment (OT) Flowsheets (Taken 04/13/2023 1521) LTG: Pt will perform lower body dressing with assistance level of: Contact Guard/Touching assist   Problem: RH Toileting Goal: LTG Patient will perform toileting task (3/3 steps) with assistance level (OT) Description: LTG: Patient will perform toileting task (3/3 steps) with assistance level (OT)  Flowsheets (Taken 04/13/2023 1521) LTG: Pt will perform toileting task (3/3 steps) with assistance level: Contact Guard/Touching assist   Problem: RH Functional Use of Upper Extremity Goal: LTG Patient will use RT/LT upper extremity as a (OT) Description: LTG: Patient will use right/left upper extremity as a stabilizer/gross assist/diminished/nondominant/dominant level with assist, with/without cues during functional activity (OT) Flowsheets (Taken 04/13/2023 1521) LTG: Use of upper extremity in functional activities: LUE as nondominant level LTG: Pt will use upper extremity in functional activity with assistance level of: Supervision/Verbal cueing   Problem: RH Toilet Transfers Goal: LTG Patient will perform toilet transfers w/assist (OT) Description: LTG: Patient will perform toilet transfers with assist, with/without cues using equipment (OT) Flowsheets (Taken 04/13/2023 1521) LTG: Pt will perform toilet transfers with assistance level of: Contact Guard/Touching assist   Problem: RH Tub/Shower Transfers Goal: LTG Patient will perform tub/shower transfers w/assist (OT) Description: LTG: Patient will perform tub/shower transfers with assist, with/without cues using equipment (OT) Flowsheets (Taken 04/13/2023 1521) LTG: Pt will perform tub/shower stall transfers with assistance level of: Contact Guard/Touching assist   Problem: RH  Attention Goal: LTG Patient will demonstrate this level of attention during functional activites (OT) Description: LTG:  Patient will demonstrate this level of attention during functional activites  (OT) Flowsheets (Taken 04/13/2023 1521) Patient will demonstrate this level of attention during functional activites: Selective LTG:  Patient will demonstrate this level of attention during functional activites (OT): Supervision   Problem: RH Awareness Goal: LTG: Patient will demonstrate awareness during functional activites type of (OT) Description: LTG: Patient will demonstrate awareness during functional activites type of (OT) Flowsheets (Taken 04/13/2023 1521) Patient will demonstrate awareness during functional activites type of: Emergent LTG: Patient will demonstrate awareness during functional activites type of (OT): Supervision

## 2023-04-13 NOTE — Progress Notes (Signed)
PROGRESS NOTE   Subjective/Complaints:  Daughter from [place] at bedside, also has local daughter  Pt slept poorly , sounds like pt had some confusion last night per his report and per RN notes    ROS- neg CP, SOB, N/V/D Objective:   CT HEAD WO CONTRAST ( )  Result Date: 04/12/2023 CLINICAL DATA:  Follow-up examination for stroke. EXAM: CT HEAD WITHOUT CONTRAST TECHNIQUE: Contiguous axial images were obtained from the base of the skull through the vertex without intravenous contrast. RADIATION DOSE REDUCTION: This exam was performed according to the departmental dose-optimization program which includes automated exposure control, adjustment of the mA and/or kV according to patient size and/or use of iterative reconstruction technique. COMPARISON:  Prior MRI and CT from 04/08/2023. FINDINGS: Brain: Generalized age-related cerebral atrophy with chronic small vessel ischemic disease. Small remote left cerebellar infarct noted. Previously identified right thalamic infarct again noted, relatively stable in size and distribution from prior. No significant regional mass effect. No hemorrhagic transformation. No other acute cortically based infarct. No acute intracranial hemorrhage. No mass lesion or midline shift. No hydrocephalus or extra-axial fluid collection. Vascular: No abnormal hyperdense vessel. Calcified atherosclerosis present at the skull base. Focal calcified plaque at the right M1 segment noted, stable. Skull: Scalp soft tissues and calvarium demonstrate no new finding. Sinuses/Orbits: Globes and orbital soft tissues within normal limits. Paranasal sinuses and mastoid air cells are largely clear. Other: None. IMPRESSION: 1. Normal expected interval evolution of previously identified right thalamic infarct. No hemorrhagic transformation or significant regional mass effect. 2. No other new acute intracranial abnormality. 3. Underlying  age-related cerebral atrophy with chronic small vessel ischemic disease. Small remote left cerebellar infarct. Electronically Signed   By: Rise Mu M.D.   On: 04/12/2023 02:09   DG Ribs Bilateral  Result Date: 04/11/2023 CLINICAL DATA:  Pain after fall EXAM: BILATERAL RIBS - 7 VIEW COMPARISON:  Chest x-ray 04/07/2023. FINDINGS: Osteopenia. No displaced rib fracture is clearly identified. As seen on the prior x-ray there is hyperinflation with diffuse interstitial changes and slightly confluence nodular opacity overlying the right upper lung. Loop recorder. Degenerative changes of the spine. Compression deformity along the lower thoracic region with augmentation cement IMPRESSION: Osteopenia. No displaced rib fracture. No pneumothorax or effusion on these rib x-rays. Please correlate with the prior chest x-ray Electronically Signed   By: Karen Kays M.D.   On: 04/11/2023 17:03   Recent Labs    04/11/23 0427 04/13/23 0514  WBC 8.0 8.5  HGB 13.7 13.6  HCT 40.5 39.5  PLT 345 358   Recent Labs    04/11/23 0427 04/13/23 0514  NA 131* 134*  K 3.8 3.9  CL 100 103  CO2 27 25  GLUCOSE 94 98  BUN 15 12  CREATININE 0.95 0.79  CALCIUM 8.6* 9.0    Intake/Output Summary (Last 24 hours) at 04/13/2023 0946 Last data filed at 04/13/2023 0809 Gross per 24 hour  Intake 118 ml  Output 700 ml  Net -582 ml        Physical Exam: Vital Signs Blood pressure (!) 159/74, pulse 70, temperature 98.1 F (36.7 C), resp. rate 18, height 5\' 7"  (1.702 m), SpO2 90%.  General: No acute distress Mood and affect are appropriate Heart: Regular rate and rhythm no rubs murmurs or extra sounds Lungs: Clear to auscultation, breathing unlabored, no rales or wheezes Abdomen: Positive bowel sounds, soft nontender to palpation, nondistended Extremities: No clubbing, cyanosis, or edema Skin: No evidence of breakdown, no evidence of rash Neurologic: Cranial nerves II through XII intact, motor  strength is 5/5 in bilateral deltoid, bicep, tricep, grip, hip flexor, knee extensors, ankle dorsiflexor and plantar flexor Sensory exam normal sensation to light touch  in bilateral upper and lower extremities Cerebellar exam normal finger to nose to finger on RIght , mild dysmetria left  Musculoskeletal: Full range of motion in all 4 extremities. No joint swelling   Assessment/Plan: 1. Functional deficits which require 3+ hours per day of interdisciplinary therapy in a comprehensive inpatient rehab setting. Physiatrist is providing close team supervision and 24 hour management of active medical problems listed below. Physiatrist and rehab team continue to assess barriers to discharge/monitor patient progress toward functional and medical goals  Care Tool:  Bathing              Bathing assist       Upper Body Dressing/Undressing Upper body dressing        Upper body assist      Lower Body Dressing/Undressing Lower body dressing            Lower body assist       Toileting Toileting    Toileting assist       Transfers Chair/bed transfer  Transfers assist           Locomotion Ambulation   Ambulation assist              Walk 10 feet activity   Assist           Walk 50 feet activity   Assist           Walk 150 feet activity   Assist           Walk 10 feet on uneven surface  activity   Assist           Wheelchair     Assist               Wheelchair 50 feet with 2 turns activity    Assist            Wheelchair 150 feet activity     Assist          Blood pressure (!) 159/74, pulse 70, temperature 98.1 F (36.7 C), resp. rate 18, height 5\' 7"  (1.702 m), SpO2 90%.  Medical Problem List and Plan: 1. 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   Antithrombotics: -DVT/anticoagulation:  Pharmaceutical: Lovenox             -antiplatelet therapy: DAPT x 3 weeks followed by Plavix alone.  3. Pain Management: tylenol prn.  4. Mood/Behavior/Sleep: LCSW to follow for evaluation and support.              -antipsychotic agents: N/A 5. Neuropsych/cognition: This patient is capable of making decisions on his own behalf, however is very HOH 6. Skin/Wound Care: Routine pressure relief measures.  7. Fluids/Electrolytes/Nutrition: Monitor I/O. BMET looks normal     Latest Ref Rng & Units 04/13/2023    5:14 AM 04/11/2023    4:27 AM 04/08/2023   12:00 AM  BMP  Glucose 70 - 99 mg/dL 98  94  98    99   BUN 8 - 23 mg/dL 12  15  21    18    Creatinine 0.61 - 1.24 mg/dL 7.82  9.56  2.13    0.86   Sodium 135 - 145 mmol/L 134  131  138    137   Potassium 3.5 - 5.1 mmol/L 3.9  3.8  4.3    4.2   Chloride 98 - 111 mmol/L 103  100  102    104   CO2 22 - 32 mmol/L 25  27  23    Calcium 8.9 - 10.3 mg/dL 9.0  8.6  9.0     8. HTN: Monitor BP TID--metoprolol resumed on 11/26.   Vitals:   04/12/23 2003 04/13/23 0435  BP: (!) 154/68 (!) 159/74  Pulse: 65 70  Resp: 18 18  Temp: 98 F (36.7 C) 98.1 F (36.7 C)  SpO2: 93% 90%  May need to ad additional BP meds such as amlodipine if sys BP remains elevated   9. 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 Dyslipidemia: LDL-98-->now on Lipitor 80 mg daily 12. Non cardiac chest pain: Was started on metoprolol with prn NTG 11/15 by Dr. Melburn Popper.   13. GERD: Continue Pepcid. 14. 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  15. OSA?: Sleep study recommended at discharge.    LOS: 1 days A FACE TO FACE EVALUATION WAS PERFORMED  Erick Colace 04/13/2023, 9:46 AM

## 2023-04-13 NOTE — Progress Notes (Signed)
Patient ID: Samuel Mata, male   DOB: 1930-08-06, 89 y.o.   MRN: [id number] Met with the patient to review current medical situation, rehab schedule, team conference and plan of care. Discussed secondary risk management including medication and dietary modification for HTN, HLD on Crestor and OSA (no CPAP).  DAPT x 3 weeks then Plavix solo.  Continue to follow along to address educational needs to facilitate preparation for discharge . Pamelia Hoit

## 2023-04-14 DIAGNOSIS — I6381 Other cerebral infarction due to occlusion or stenosis of small artery: Secondary | ICD-10-CM | POA: Diagnosis not present

## 2023-04-14 LAB — BASIC METABOLIC PANEL
Anion gap: 4 — ABNORMAL LOW (ref 5–15)
BUN: 13 mg/dL (ref 8–23)
CO2: 27 mmol/L (ref 22–32)
Calcium: 8.7 mg/dL — ABNORMAL LOW (ref 8.9–10.3)
Chloride: 101 mmol/L (ref 98–111)
Creatinine, Ser: 0.88 mg/dL (ref 0.61–1.24)
GFR, Estimated: >60 mL/min (ref >60)
Glucose, Bld: 95 mg/dL (ref 70–99)
Potassium: 3.8 mmol/L (ref 3.5–5.1)
Sodium: 132 mmol/L — ABNORMAL LOW (ref 135–145)

## 2023-04-14 LAB — CBC
HCT: 39.4 % (ref 39.0–52.0)
Hemoglobin: 13.4 g/dL (ref 13.0–17.0)
MCH: 31 pg (ref 26.0–34.0)
MCHC: 34 g/dL (ref 30.0–36.0)
MCV: 91.2 fL (ref 80.0–100.0)
Platelets: 370 10*3/uL (ref 150–400)
RBC: 4.32 MIL/uL (ref 4.22–5.81)
RDW: 12 % (ref 11.5–15.5)
WBC: 7.9 10*3/uL (ref 4.0–10.5)
nRBC: 0 % (ref 0.0–0.2)

## 2023-04-14 MED ORDER — AMLODIPINE BESYLATE 5 MG PO TABS
5.0000 mg | ORAL_TABLET | Freq: Every day | ORAL | Status: DC
  Administered 2023-04-14 – 2023-04-29 (×16): 5 mg via ORAL
  Filled 2023-04-14 (×16): qty 1

## 2023-04-14 NOTE — Progress Notes (Signed)
Restless night. A & O to self and place. 2 attempts to get OOB without assistance. Bedalarm and headstart belt in place to remind patient to call and alert staff. Timed toileting, continent & incontinent of urine. LBM-11/27. Denies pain. Samuel Mata A

## 2023-04-14 NOTE — Progress Notes (Signed)
PROGRESS NOTE   Subjective/Complaints:  Pt reports no issues- having BM's daily-last yesterday-  Denies pain and slept well per pt, however per d/w nursing, pt having restlessness and trying to get OOB- somewhat confused/sundowning? Doing timed toileting, but sometimes incontinent of urine   ROS-  Pt denies SOB, abd pain, CP, N/V/C/D, and vision changes  Objective:   No results found. Recent Labs    04/13/23 0514 04/14/23 0500  WBC 8.5 7.9  HGB 13.6 13.4  HCT 39.5 39.4  PLT 358 370   Recent Labs    04/13/23 0514 04/14/23 0500  NA 134* 132*  K 3.9 3.8  CL 103 101  CO2 25 27  GLUCOSE 98 95  BUN 12 13  CREATININE 0.79 0.88  CALCIUM 9.0 8.7*    Intake/Output Summary (Last 24 hours) at 04/14/2023 0915 Last data filed at 04/14/2023 0824 Gross per 24 hour  Intake 360 ml  Output 475 ml  Net -115 ml        Physical Exam: Vital Signs Blood pressure (!) 155/74, pulse (!) 53, temperature 98.1 F (36.7 C), resp. rate 16, height 5\' 7"  (1.702 m), weight 58.8 kg, SpO2 92%.   General: awake, alert, appropriate, sitting up in bed- ate 50% of tray so far; NAD HENT: conjugate gaze; oropharynx moist CV: regular rhythm, bradycardic  rate; no JVD Pulmonary: CTA B/L; no W/R/R- good air movement GI: soft, NT, ND, (+)BS Psychiatric: appropriate Neurological: alert Skin: No evidence of breakdown, no evidence of rash Neurologic: Cranial nerves II through XII intact, motor strength is 5/5 in bilateral deltoid, bicep, tricep, grip, hip flexor, knee extensors, ankle dorsiflexor and plantar flexor Sensory exam normal sensation to light touch  in bilateral upper and lower extremities Cerebellar exam normal finger to nose to finger on RIght , mild dysmetria left  Musculoskeletal: Full range of motion in all 4 extremities. No joint swelling   Assessment/Plan: 1. Functional deficits which require 3+ hours per day of  interdisciplinary therapy in a comprehensive inpatient rehab setting. Physiatrist is providing close team supervision and 24 hour management of active medical problems listed below. Physiatrist and rehab team continue to assess barriers to discharge/monitor patient progress toward functional and medical goals  Care Tool:  Bathing    Body parts bathed by patient: Right arm, Left arm, Chest, Face, Front perineal area, Abdomen, Right upper leg, Left upper leg   Body parts bathed by helper: Front perineal area, Buttocks, Left lower leg, Right lower leg, Left upper leg, Right upper leg     Bathing assist Assist Level: Moderate Assistance - Patient 50 - 74%     Upper Body Dressing/Undressing Upper body dressing   What is the patient wearing?: Pull over shirt    Upper body assist Assist Level: Moderate Assistance - Patient 50 - 74%    Lower Body Dressing/Undressing Lower body dressing      What is the patient wearing?: Pants     Lower body assist Assist for lower body dressing: Moderate Assistance - Patient 50 - 74%     Toileting Toileting    Toileting assist Assist for toileting: Moderate Assistance - Patient 50 - 74%  Transfers Chair/bed transfer  Transfers assist     Chair/bed transfer assist level: Moderate Assistance - Patient 50 - 74%     Locomotion Ambulation   Ambulation assist      Assist level: Moderate Assistance - Patient 50 - 74% Assistive device: Walker-rolling Max distance: 47ft   Walk 10 feet activity   Assist     Assist level: Moderate Assistance - Patient - 50 - 74% Assistive device: Walker-rolling   Walk 50 feet activity   Assist    Assist level: Moderate Assistance - Patient - 50 - 74% Assistive device: Walker-rolling    Walk 150 feet activity   Assist Walk 150 feet activity did not occur: Safety/medical concerns         Walk 10 feet on uneven surface  activity   Assist Walk 10 feet on uneven surfaces activity  did not occur: Safety/medical concerns         Wheelchair     Assist Is the patient using a wheelchair?: Yes Type of Wheelchair: Manual    Wheelchair assist level: Dependent - Patient 0%      Wheelchair 50 feet with 2 turns activity    Assist        Assist Level: Dependent - Patient 0%   Wheelchair 150 feet activity     Assist      Assist Level: Dependent - Patient 0%   Blood pressure (!) 155/74, pulse (!) 53, temperature 98.1 F (36.7 C), resp. rate 16, height 5\' 7"  (1.702 m), weight 58.8 kg, SpO2 92%.  Medical Problem List and Plan: 1. Functional deficits secondary to R thalamic/capsular infarct on 04/07/23             -patient may  shower             -ELOS/Goals: 7-10 days- supervision             Con't CIR PT, OT and SLP  No therapy today due to holiday 2.  Antithrombotics: -DVT/anticoagulation:  Pharmaceutical: Lovenox             -antiplatelet therapy: DAPT x 3 weeks followed by Plavix alone.  3. Pain Management: tylenol prn.  4. Mood/Behavior/Sleep: LCSW to follow for evaluation and support.              -antipsychotic agents: N/A 5. Neuropsych/cognition: This patient is? capable of making decisions on his own behalf, however is very HOH and maybe sundowning 6. Skin/Wound Care: Routine pressure relief measures.  7. Fluids/Electrolytes/Nutrition: Monitor I/O. BMET looks normal     Latest Ref Rng & Units 04/14/2023    5:00 AM 04/13/2023    5:14 AM 04/11/2023    4:27 AM  BMP  Glucose 70 - 99 mg/dL 95  98  94   BUN 8 - 23 mg/dL 13  12  15    Creatinine 0.61 - 1.24 mg/dL 2.95  6.21  3.08   Sodium 135 - 145 mmol/L 132  134  131   Potassium 3.5 - 5.1 mmol/L 3.8  3.9  3.8   Chloride 98 - 111 mmol/L 101  103  100   CO2 22 - 32 mmol/L 27  25  27    Calcium 8.9 - 10.3 mg/dL 8.7  9.0  8.6     8. HTN: Monitor BP TID--metoprolol resumed on 11/26.    Vitals:   04/14/23 0505 04/14/23 0825  BP: (!) 155/74 (!) 155/74  Pulse: (!) 53 (!) 53  Resp: 16    Temp:  98.1 F (36.7 C)   SpO2: 92%   May need to ad additional BP meds such as amlodipine if sys BP remains elevated   11/28- Will add Norvasc to start  5 mg daily- for elevated BP- will monitor trend  9. 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 Dyslipidemia: LDL-98-->now on Lipitor 80 mg daily 12. Non cardiac chest pain: Was started on metoprolol with prn NTG 11/15 by Dr. Melburn Popper.   13. GERD: Continue Pepcid. 14. 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  15. OSA?: Sleep study recommended at discharge.    I spent a total of  37   minutes on total care today- >50% coordination of care- due to  D/w nursing and review of notes- review of labs, vitals and CBGs- and medical decisions/changes made  LOS: 2 days A FACE TO FACE EVALUATION WAS PERFORMED  Cheyrl Buley 04/14/2023, 9:15 AM

## 2023-04-14 NOTE — Plan of Care (Signed)
  Problem: Consults Goal: RH STROKE PATIENT EDUCATION Description: See Patient Education module for education specifics  Outcome: Progressing   Problem: RH BOWEL ELIMINATION Goal: RH STG MANAGE BOWEL WITH ASSISTANCE Description: STG Manage Bowel with mod I Assistance. Outcome: Progressing Goal: RH STG MANAGE BOWEL W/MEDICATION W/ASSISTANCE Description: STG Manage Bowel with Medication with mod I Assistance. Outcome: Progressing   Problem: RH BLADDER ELIMINATION Goal: RH STG MANAGE BLADDER WITH ASSISTANCE Description: STG Manage Bladder With mod I Assistance Outcome: Progressing Goal: RH STG MANAGE BLADDER WITH MEDICATION WITH ASSISTANCE Description: STG Manage Bladder With Medication With mod I Assistance. Outcome: Progressing   Problem: RH SAFETY Goal: RH STG ADHERE TO SAFETY PRECAUTIONS W/ASSISTANCE/DEVICE Description: STG Adhere to Safety Precautions With cues Assistance/Device. Outcome: Progressing   Problem: RH PAIN MANAGEMENT Goal: RH STG PAIN MANAGED AT OR BELOW [name] PAIN GOAL Description: < 4 with prns Outcome: Progressing   Problem: RH KNOWLEDGE DEFICIT Goal: RH STG INCREASE KNOWLEDGE OF HYPERTENSION Description: Patient and wife will be able to manage HTN with medications and dietary modification using educational resources independently Outcome: Progressing Goal: RH STG INCREASE KNOWLEGDE OF HYPERLIPIDEMIA Description: Patient and wife will be able to manage HLD with medications and dietary modification using educational resources independently Outcome: Progressing Goal: RH STG INCREASE KNOWLEDGE OF STROKE PROPHYLAXIS Description: Patient and wife will be able to manage secondary risks with medications and dietary modification using educational resources independently Outcome: Progressing

## 2023-04-14 NOTE — Progress Notes (Signed)
Patient ID: Samuel Mata, male   DOB: 08-21-1930, 89 y.o.   MRN: [id number]  Patient home photos sent to primary PT/OT.

## 2023-04-15 DIAGNOSIS — I6381 Other cerebral infarction due to occlusion or stenosis of small artery: Secondary | ICD-10-CM | POA: Diagnosis not present

## 2023-04-15 MED ORDER — QUETIAPINE FUMARATE 25 MG PO TABS
12.5000 mg | ORAL_TABLET | Freq: Every evening | ORAL | Status: DC | PRN
  Administered 2023-04-16: 12.5 mg via ORAL
  Filled 2023-04-15: qty 1

## 2023-04-15 MED ORDER — QUETIAPINE FUMARATE 25 MG PO TABS: 12.5000 mg | ORAL_TABLET | Freq: Every day | ORAL | Status: DC

## 2023-04-15 NOTE — Progress Notes (Signed)
Physical Therapy Session Note  Patient Details  Name: Samuel Mata MRN: [id number] Date of Birth: [birth year]  Today's Date: 04/15/2023 PT Individual Time: 0951-1100 PT Individual Time Calculation (min): 69 min   Short Term Goals: Week 1:  PT Short Term Goal 1 (Week 1): Pt will perform supine<>sit with min A PT Short Term Goal 2 (Week 1): Pt will perform sit<>stands using LRAD with min A PT Short Term Goal 3 (Week 1): Pt will perform bed<>chair transfer using LRAD with min A PT Short Term Goal 4 (Week 1): Pt will ambulate at least 55ft using LRAD with min A  Skilled Therapeutic Interventions/Progress Updates:    Pt received asleep, sitting in w/c and upon awakening agreeable to therapy session. Pt reports feeling really lethargic this morning but energy levels increased during session. Sitting in w/c threaded on LB clothing and donned socks, shoes total A. Sit>stand w/c>RW with heavy min A for balance due to L posterior lean, cuing to push up from seat rather than pulling up on RW - standing with min A for balance while pulling pants up over hips dependent A.  Transported to/from gym in w/c for time management and energy conservation.  Sit>stands throughout session with still heavy min A due to strong L posterior lean with poor awareness of midline - benefits from cuing to scoot forward, bring feet back underneath BOS, and lean anteriorly while rising and pushing up with B UEs from armrests.  Gait training 19ft using RW with heavy min assist for balance and facilitating weight shift. Pt demonstrating the following gait deviations with therapist providing the described cuing and facilitation for improvement:  - reciprocal stepping pattern but shuffled steps and slightly wide BOS - poor R weight shift onto R stance phase to allow improved L swing advancement, poor L foot clearance - poor AD management, pushing it too far forward  - significantly decreased gait speed (requires ~40min to walk  this distance) - continues to be easily distracted internally and externally with poor awareness of his deficits throughout  *At end of each gait cycle, pt requires increased balance assist due to not stepping towards seat completely with L foot and not turning hips fully prior to initiating sit - manual facilitation to safely place hips in w/c.   Gait training ~127ft using +2 B HHA to allow increased upright posture while providing sense of security, +2 min assist for balance. Pt demonstrating the following gait deviations with therapist providing the described cuing and facilitation for improvement:  - has min L knee instability during stance with slight knee flexion but no buckling, benefits from cuing to extend it, but inconsistent at carrying over this improvement - increased gait speed compared to with RW - larger step lengths - pt becomes aware of poor L foot clearance and improves it with decreased cuing  - continues to have slightly wider BOS  - increased lateral instability compared to with RW  Gait training ~182ft using L UE support around therapist's shoulders and then intermittent R UE support on hallway railing with heavy min/mod assist for balance. Pt demonstrating the following gait deviations with therapist providing the described cuing and facilitation for improvement:  - demos onset of potential pusher syndrome with stronger L lean tendency and pushing with R LE when therapist manually facilitating trunk towards midline - continues to have wide BOS with R LE abducted  - short step lengths, L more impaired than R  Short distance ~46ft forward/backwards ambulation with mirror feedback focusing  on improved midline orientation with pt requiring mod A for balance and up to total A to maintain upright due to poor awareness of L posterior lean and inability to correct it. Pt is able to utilize mirror to notice his error, but struggles to motor plan how to fix it.   Transported back to  room in w/c.   Stand pivot w/c<>BSC over toilet using B UE support on grab bar with min/mod A for balance due to strong L posterior lean - manual facilitation for weight shifting and pivoting hips fully when turning to sit. Standing with heavy min A and UE support on grab bar/armrest during dependent LB clothing management. Continent of bladder. Seated hand hygiene at sink.   L stand pivot w/c>EOB using bedrail with min A and max cuing for sequencing and proper setup. Sit>supine with CGA. Pt left supine in bed with needs in reach, bed alarm belt on, and bed alarm on.   Therapy Documentation Precautions:  Precautions Precautions: Fall, Other (comment) Precaution Comments: HOH (hears better in R ear), sundowning, L inattention, L vs posterior lean Restrictions Weight Bearing Restrictions: No   Pain:  Denies pain during session.    Therapy/Group: Individual Therapy  Ginny Forth , PT, DPT, NCS, CSRS 04/15/2023, 7:53 AM

## 2023-04-15 NOTE — Progress Notes (Signed)
Occupational Therapy Session Note  Patient Details  Name: Samuel Mata MRN: [id number] Date of Birth: 1930/11/06  {CHL IP REHAB OT TIME CALCULATIONS:[id number]}   Short Term Goals: Week 1:  OT Short Term Goal 1 (Week 1): Pt will perform sit<>stands using LRAD min A consistently OT Short Term Goal 2 (Week 1): Pt will maintain standing balance during BADLs min A OT Short Term Goal 3 (Week 1): Pt will maintain sitting balance during BADLs CGA to decrease overall bourdon of care OT Short Term Goal 4 (Week 1): Pt will complete LB dressing min A using LRAD  Skilled Therapeutic Interventions/Progress Updates:  Pt greeted *** for skilled OT session with focus on ***.   Pain: Pt reported ***/10 pain, stating "***" in reference to ***. OT offering intermediate rest breaks and positioning suggestions throughout session to address pain/fatigue and maximize participation/safety in session.   Functional Transfers:  Self Care Tasks:  Therapeutic Activities:  Therapeutic Exercise:   Education:  Pt remained *** with 4Ps assessed and immediate needs met. Pt continues to be appropriate for skilled OT intervention to promote further functional independence in ADLs/IADLs.    Therapy Documentation Precautions:  Precautions Precautions: Fall, Other (comment) Precaution Comments: HOH (hears better in R ear), sundowning, L inattention, L vs posterior lean Restrictions Weight Bearing Restrictions: No   Therapy/Group: Individual Therapy  Lou Cal, OTR/L, MSOT  04/15/2023, 11:22 PM

## 2023-04-15 NOTE — Progress Notes (Signed)
Slept better tonight. Slept from 2045-2230. Unable to go back to sleep, several attempts OOB. Headstart belt and bed alarm in place. PRN Melatonin given at 2318, effective. 2 incontinent voids during night. Leans posteriorly when transferring. Alfredo Martinez A

## 2023-04-15 NOTE — Progress Notes (Signed)
Speech Language Pathology Daily Session Note  Patient Details  Name: Pranith Veeder MRN: [id number] Date of Birth: Jul 26, 1930  Today's Date: 04/15/2023 SLP Individual Time: 0800-0900 SLP Individual Time Calculation (min): 60 min  Short Term Goals: Week 1: SLP Short Term Goal 1 (Week 1): Patient will recall and utilize memory compensatory strategies given min multimodal A SLP Short Term Goal 2 (Week 1): Patient will increase processing speed during functional tasks given min multimodal A  Skilled Therapeutic Interventions: Skilled therapy session focused on cognitive goals. SLP facilitated session by providing minA during completion of card categorization task. Patient was prompted to categorize cards according to shape, number and color. Patient with sustained attention throughout task. SLP continued to addressed cognition through education regarding memory strategies. Patient utilized memory strategies and provided examples of each given minA. At the end of the session, patient began to fatigue stating he needed a "nap." SLP provided minA to engage patient in remainder of session. Patient left in chair with alarm set and call bell in reach. Continue POC.   Pain Denies  Therapy/Group: Individual Therapy  Jullian Clayson M.A., CF-SLP 04/15/2023, 7:40 AM

## 2023-04-15 NOTE — Progress Notes (Signed)
Occupational Therapy Session Note  Patient Details  Name: Samuel Mata MRN: [id number] Date of Birth: 03-27-1931  Today's Date: 04/15/2023 OT Individual Time: 4098-1191 OT Individual Time Calculation (min): 70 min    Short Term Goals: Week 1:  OT Short Term Goal 1 (Week 1): Pt will perform sit<>stands using LRAD min A consistently OT Short Term Goal 2 (Week 1): Pt will maintain standing balance during BADLs min A OT Short Term Goal 3 (Week 1): Pt will maintain sitting balance during BADLs CGA to decrease overall bourdon of care OT Short Term Goal 4 (Week 1): Pt will complete LB dressing min A using LRAD  Skilled Therapeutic Interventions/Progress Updates:     Pt received sitting up in bed finishing his lunch upon OT arrival. Pt presenting to be calm, however concerned and confused d/t Pt reporting "my telephone keeps ringing, but I cannot figure out how to answer it and alarms keep going off, I don't know what going on". Provided therapeutic support, encouragement, education on hospital alarms, and gentle re-orientation with improvement in moral noted following. Pt receptive to skilled OT session reporting 0/10 pain- OT offering intermittent rest breaks, repositioning, and therapeutic support to optimize participation in therapy session. Pt dressed for the day upon OT arrival, requesting to brush teeth and shave during session. Pt transitioned to EOB using bed features with min A to lift trunk and bring B LEs to EOB. Donned shoes with set-up assist. Engaged Pt in completing short distance functional mobility in room EOB>wc using RW with Pt presenting with posterior L lean and decreased L side awareness requiring min A for balance and RW management with  tactile and verbal cues provided for weight shifting, midline orientation, problem solving, and L foot placement. Pt responded best to external visual cues to kick L foot towards front wheel of RW. Positioned Pt in front of sink for  grooming/hygiene tasks with emphasis on L side attention, functional cognition, and attention. Positioned toiletry items on L side of sink to facilitate increased attention to L side with Pt able to locate 5/5 items with min verbal cues. Pt able to brush teeth, wash his face, shave face, and groom hair during session with supervision +significantly increased amount of time. Pt easily distracted by internal and external stimuli during session requiring mod verbal cues to attention to task. Engaged Pt in completing functional mobility from wc>recliner using RW ~15 ft for endurance and functional mobility training with Pt requiring heavy min A for balance, mod verbal cues for problem solving using RW, and tactile cues for midline orientation and weight shifting. Pt attempting to sit prior to having back of his legs positioned against recliner with max verbal cues required for safety during stand>sit. Pt requesting to learn how to use room phone. Engaged Pt in functional cognition activity to work on attention and problem solving skills with Pt instructed to utilize room phone to call his wife with verbal and written instructions provided prior to task. Pt able to demonstrate teach back as evidence of learning, calling his wife with min questioning cues required. Pt was left resting in recliner with call bell in reach, seatbelt alarm on, and all needs met.    Therapy Documentation Precautions:  Precautions Precautions: Fall, Other (comment) Precaution Comments: HOH (hears better in R ear), sundowning, L inattention, L vs posterior lean Restrictions Weight Bearing Restrictions: No   Therapy/Group: Individual Therapy  [name] 04/15/2023, 1:38 PM

## 2023-04-15 NOTE — Progress Notes (Signed)
PROGRESS NOTE   Subjective/Complaints:  Pt reports "don't like ot talk to people when I first wake up" and "I need to go to bathroom NOW".   Denies pain  Per nursing note, didn't sleep well- up/restless most of night- needed belt-    ROS-  Limited by his decision to not answer questions  Objective:   No results found. Recent Labs    04/13/23 0514 04/14/23 0500  WBC 8.5 7.9  HGB 13.6 13.4  HCT 39.5 39.4  PLT 358 370   Recent Labs    04/13/23 0514 04/14/23 0500  NA 134* 132*  K 3.9 3.8  CL 103 101  CO2 25 27  GLUCOSE 98 95  BUN 12 13  CREATININE 0.79 0.88  CALCIUM 9.0 8.7*    Intake/Output Summary (Last 24 hours) at 04/15/2023 0906 Last data filed at 04/15/2023 5643 Gross per 24 hour  Intake 836 ml  Output 500 ml  Net 336 ml        Physical Exam: Vital Signs Blood pressure 127/81, pulse 74, temperature 97.8 F (36.6 C), temperature source Oral, resp. rate 16, height 5\' 7"  (1.702 m), weight 58.8 kg, SpO2 94%.    General: awake, alert, sitting EOB; wants bathroom; hadn't called for nursing; NAD HENT: conjugate gaze; oropharynx moist CV: regular rate; no JVD Pulmonary: CTA B/L; no W/R/R- good air movement GI: soft, NT, ND, (+)BS Psychiatric: somewhat irritable Neurological: alert-   Skin: No evidence of breakdown, no evidence of rash Neurologic: Cranial nerves II through XII intact, motor strength is 5/5 in bilateral deltoid, bicep, tricep, grip, hip flexor, knee extensors, ankle dorsiflexor and plantar flexor Sensory exam normal sensation to light touch  in bilateral upper and lower extremities Cerebellar exam normal finger to nose to finger on RIght , mild dysmetria left  Musculoskeletal: Full range of motion in all 4 extremities. No joint swelling   Assessment/Plan: 1. Functional deficits which require 3+ hours per day of interdisciplinary therapy in a comprehensive inpatient rehab  setting. Physiatrist is providing close team supervision and 24 hour management of active medical problems listed below. Physiatrist and rehab team continue to assess barriers to discharge/monitor patient progress toward functional and medical goals  Care Tool:  Bathing    Body parts bathed by patient: Right arm, Left arm, Chest, Face, Front perineal area, Abdomen, Right upper leg, Left upper leg   Body parts bathed by helper: Front perineal area, Buttocks, Left lower leg, Right lower leg, Left upper leg, Right upper leg     Bathing assist Assist Level: Moderate Assistance - Patient 50 - 74%     Upper Body Dressing/Undressing Upper body dressing   What is the patient wearing?: Pull over shirt    Upper body assist Assist Level: Moderate Assistance - Patient 50 - 74%    Lower Body Dressing/Undressing Lower body dressing      What is the patient wearing?: Pants     Lower body assist Assist for lower body dressing: Moderate Assistance - Patient 50 - 74%     Toileting Toileting    Toileting assist Assist for toileting: Moderate Assistance - Patient 50 - 74%     Transfers  Chair/bed transfer  Transfers assist     Chair/bed transfer assist level: Moderate Assistance - Patient 50 - 74%     Locomotion Ambulation   Ambulation assist      Assist level: Moderate Assistance - Patient 50 - 74% Assistive device: Walker-rolling Max distance: 52ft   Walk 10 feet activity   Assist     Assist level: Moderate Assistance - Patient - 50 - 74% Assistive device: Walker-rolling   Walk 50 feet activity   Assist    Assist level: Moderate Assistance - Patient - 50 - 74% Assistive device: Walker-rolling    Walk 150 feet activity   Assist Walk 150 feet activity did not occur: Safety/medical concerns         Walk 10 feet on uneven surface  activity   Assist Walk 10 feet on uneven surfaces activity did not occur: Safety/medical concerns          Wheelchair     Assist Is the patient using a wheelchair?: Yes Type of Wheelchair: Manual    Wheelchair assist level: Dependent - Patient 0%      Wheelchair 50 feet with 2 turns activity    Assist        Assist Level: Dependent - Patient 0%   Wheelchair 150 feet activity     Assist      Assist Level: Dependent - Patient 0%   Blood pressure 127/81, pulse 74, temperature 97.8 F (36.6 C), temperature source Oral, resp. rate 16, height 5\' 7"  (1.702 m), weight 58.8 kg, SpO2 94%.  Medical Problem List and Plan: 1. 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  Antithrombotics: -DVT/anticoagulation:  Pharmaceutical: Lovenox             -antiplatelet therapy: DAPT x 3 weeks followed by Plavix alone.  3. Pain Management: tylenol prn.  4. Mood/Behavior/Sleep: LCSW to follow for evaluation and support.              -antipsychotic agents: N/A 5. Neuropsych/cognition: This patient is? capable of making decisions on his own behalf, however is very HOH and maybe sundowning 6. Skin/Wound Care: Routine pressure relief measures.  7. Fluids/Electrolytes/Nutrition: Monitor I/O. BMET looks normal     Latest Ref Rng & Units 04/14/2023    5:00 AM 04/13/2023    5:14 AM 04/11/2023    4:27 AM  BMP  Glucose 70 - 99 mg/dL 95  98  94   BUN 8 - 23 mg/dL 13  12  15    Creatinine 0.61 - 1.24 mg/dL 2.59  5.63  8.75   Sodium 135 - 145 mmol/L 132  134  131   Potassium 3.5 - 5.1 mmol/L 3.8  3.9  3.8   Chloride 98 - 111 mmol/L 101  103  100   CO2 22 - 32 mmol/L 27  25  27    Calcium 8.9 - 10.3 mg/dL 8.7  9.0  8.6     8. HTN: Monitor BP TID--metoprolol resumed on 11/26.    Vitals:   04/15/23 0416 04/15/23 0741  BP: (!) 140/69 127/81  Pulse: (!) 56 74  Resp: 16   Temp: 97.8 F (36.6 C)   SpO2: 94%  May need to ad additional BP  meds such as amlodipine if sys BP remains elevated   11/28- Will add Norvasc to start  5 mg daily- for elevated BP- will monitor trend  11/29- BP before AM meds, 140/69- otherwise, has been better controlled 9. 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 Dyslipidemia: LDL-98-->now on Lipitor 80 mg daily 12. Non cardiac chest pain: Was started on metoprolol with prn NTG 11/15 by Dr. Melburn Popper.   13. GERD: Continue Pepcid. 14. 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  15. OSA?: Sleep study recommended at discharge.  16. Sundowning?  11/29- will try Seroquel 12.5 mg at bedtime prn for agitation/restlessness/lack of sleep    LOS: 3 days A FACE TO FACE EVALUATION WAS PERFORMED  Domonick Sittner 04/15/2023, 9:06 AM

## 2023-04-15 NOTE — Plan of Care (Signed)
  Problem: Consults Goal: RH STROKE PATIENT EDUCATION Description: See Patient Education module for education specifics  Outcome: Progressing   Problem: RH BOWEL ELIMINATION Goal: RH STG MANAGE BOWEL WITH ASSISTANCE Description: STG Manage Bowel with mod I Assistance. Outcome: Progressing Goal: RH STG MANAGE BOWEL W/MEDICATION W/ASSISTANCE Description: STG Manage Bowel with Medication with mod I Assistance. Outcome: Progressing   Problem: RH BLADDER ELIMINATION Goal: RH STG MANAGE BLADDER WITH ASSISTANCE Description: STG Manage Bladder With mod I Assistance Outcome: Progressing Goal: RH STG MANAGE BLADDER WITH MEDICATION WITH ASSISTANCE Description: STG Manage Bladder With Medication With mod I Assistance. Outcome: Progressing   Problem: RH SAFETY Goal: RH STG ADHERE TO SAFETY PRECAUTIONS W/ASSISTANCE/DEVICE Description: STG Adhere to Safety Precautions With cues Assistance/Device. Outcome: Progressing   Problem: RH PAIN MANAGEMENT Goal: RH STG PAIN MANAGED AT OR BELOW [name] PAIN GOAL Description: < 4 with prns Outcome: Progressing   Problem: RH KNOWLEDGE DEFICIT Goal: RH STG INCREASE KNOWLEDGE OF HYPERTENSION Description: Patient and wife will be able to manage HTN with medications and dietary modification using educational resources independently Outcome: Progressing Goal: RH STG INCREASE KNOWLEGDE OF HYPERLIPIDEMIA Description: Patient and wife will be able to manage HLD with medications and dietary modification using educational resources independently Outcome: Progressing Goal: RH STG INCREASE KNOWLEDGE OF STROKE PROPHYLAXIS Description: Patient and wife will be able to manage secondary risks with medications and dietary modification using educational resources independently Outcome: Progressing

## 2023-04-15 NOTE — IPOC Note (Signed)
Overall Plan of Care [facility]) Patient Details Name: Samuel Mata MRN: [id number] DOB: [birth year]  Admitting Diagnosis: Right thalamic stroke [facility])  Hospital Problems: Principal Problem:   Right thalamic stroke [facility])     Functional Problem List: Nursing Bladder, Bowel, Pain, Safety, Endurance, Medication Management  PT Balance, Pain, Perception, Behavior, Safety, Sensory, Endurance, Motor, Skin Integrity, Nutrition  OT Balance, Perception, Safety, Behavior, Cognition, Skin Integrity, Endurance, Motor  SLP Cognition  TR         Basic ADL's: OT Toileting, Dressing, Bathing, Grooming     Advanced  ADL's: OT       Transfers: PT Bed Mobility, Bed to Chair, Car, Lobbyist, Technical brewer: PT Ambulation, Stairs     Additional Impairments: OT Fuctional Use of Upper Extremity  SLP Social Cognition   Memory (processing speed)  TR      Anticipated Outcomes Item Anticipated Outcome  Self Feeding Set-up  Swallowing      Basic self-care  Supervision-CGA  Toileting  Supervision   Bathroom Transfers Supervision-CGA  Bowel/Bladder  manage bowel w toileting and bladder w mod I assist  Transfers  CGA using LRAD  Locomotion  CGA using LRAD  Communication     Cognition  supervision A  Pain  < 4 with prns  Safety/Judgment  manage w cues   Therapy Plan: PT Intensity: Minimum of 1-2 x/day ,45 to 90 minutes PT Frequency: 5 out of 7 days PT Duration Estimated Length of Stay: 2-2.5 weeks OT Intensity: Minimum of 1-2 x/day, 45 to 90 minutes OT Frequency: 5 out of 7 days OT Duration/Estimated Length of Stay: 2-2.5 weeks SLP Intensity: Minumum of 1-2 x/day, 30 to 90 minutes SLP Frequency: 1 to 3 out of 7 days SLP Duration/Estimated Length of Stay: 14-16 days   Team Interventions: Nursing Interventions Patient/Family Education, Disease Management/Prevention, Discharge Planning, Pain Management, Bladder Management, Bowel Management, Medication  Management  PT interventions Ambulation/gait training, Cognitive remediation/compensation, Discharge planning, DME/adaptive equipment instruction, Functional mobility training, Pain management, Psychosocial support, Therapeutic Activities, UE/LE Strength taining/ROM, Visual/perceptual remediation/compensation, Warden/ranger, Community reintegration, Disease management/prevention, Neuromuscular re-education, Functional electrical stimulation, Patient/family education, Skin care/wound management, Stair training, Therapeutic Exercise, UE/LE Coordination activities  OT Interventions Balance/vestibular training, Discharge planning, Functional electrical stimulation, Self Care/advanced ADL retraining, Pain management, Therapeutic Activities, UE/LE Coordination activities, Cognitive remediation/compensation, Disease mangement/prevention, Functional mobility training, Patient/family education, Skin care/wound managment, Therapeutic Exercise, Visual/perceptual remediation/compensation, Community reintegration, Neuromuscular re-education, DME/adaptive equipment instruction, Psychosocial support, Splinting/orthotics, Wheelchair propulsion/positioning, UE/LE Strength taining/ROM  SLP Interventions Cognitive remediation/compensation, Internal/external aids, Therapeutic Activities, Functional tasks, Patient/family education, Therapeutic Exercise  TR Interventions    SW/CM Interventions Discharge Planning, Psychosocial Support, Patient/Family Education, Disease Management/Prevention   Barriers to Discharge MD  Medical stability, Home enviroment access/loayout, Incontinence, Weight, Medication compliance, and Behavior  Nursing Decreased caregiver support, Home environment access/layout 1 level 2 st no rails w spouse  PT Decreased caregiver support, Lack of/limited family support, Other (comments) pt's impaired awareness  OT      SLP      SW Lack of/limited family support, Decreased caregiver support      Team Discharge Planning: Destination: PT-Home ,OT- Home , SLP-Home Projected Follow-up: PT-Home health PT, 24 hour supervision/assistance, OT-  Home health OT, SLP-None Projected Equipment Needs: PT-To be determined, OT- To be determined, SLP-None recommended by SLP Equipment Details: PT- , OT-Pt owns A TTB, SPC, and BSC Patient/family involved in discharge planning: PT- Patient,  OT-Patient, Family member/caregiver, SLP-Patient  MD ELOS:  2-2.5 weeks Medical Rehab Prognosis:  Good Assessment: The patient has been admitted for CIR therapies with the diagnosis of R thalamic stroke. The team will be addressing functional mobility, strength, stamina, balance, safety, adaptive techniques and equipment, self-care, bowel and bladder mgt, patient and caregiver education, sundowning. Goals have been set at supervision to CGA. Anticipated discharge destination is home with family.        See Team Conference Notes for weekly updates to the plan of care

## 2023-04-16 DIAGNOSIS — I10 Essential (primary) hypertension: Secondary | ICD-10-CM | POA: Diagnosis not present

## 2023-04-16 DIAGNOSIS — I6381 Other cerebral infarction due to occlusion or stenosis of small artery: Secondary | ICD-10-CM | POA: Diagnosis not present

## 2023-04-16 DIAGNOSIS — R001 Bradycardia, unspecified: Secondary | ICD-10-CM

## 2023-04-16 DIAGNOSIS — H1031 Unspecified acute conjunctivitis, right eye: Secondary | ICD-10-CM | POA: Diagnosis not present

## 2023-04-16 MED ORDER — NAPHAZOLINE-GLYCERIN 0.012-0.25 % OP SOLN: 1.0000 [drp] | OPHTHALMIC | Status: DC | PRN

## 2023-04-16 MED ORDER — METOPROLOL TARTRATE 12.5 MG HALF TABLET
12.5000 mg | ORAL_TABLET | Freq: Two times a day (BID) | ORAL | Status: DC
  Administered 2023-04-16 – 2023-04-27 (×21): 12.5 mg via ORAL
  Filled 2023-04-16 (×22): qty 1

## 2023-04-16 MED ORDER — POLYVINYL ALCOHOL 1.4 % OP SOLN: 1.0000 [drp] | OPHTHALMIC | Status: DC | PRN

## 2023-04-16 NOTE — Progress Notes (Signed)
PROGRESS NOTE   Subjective/Complaints:  No acute events overnight.  Patient reports his right eye was a little irritated this morning, not particularly painful, this has improved this afternoon.  Reports he is feeling "better every day ".  LBM 11/28  ROS-denies fever, chills, chest pain, abdominal pain, nausea, vomiting  Objective:   No results found. Recent Labs    04/14/23 0500  WBC 7.9  HGB 13.4  HCT 39.4  PLT 370   Recent Labs    04/14/23 0500  NA 132*  K 3.8  CL 101  CO2 27  GLUCOSE 95  BUN 13  CREATININE 0.88  CALCIUM 8.7*    Intake/Output Summary (Last 24 hours) at 04/16/2023 1411 Last data filed at 04/16/2023 1252 Gross per 24 hour  Intake 718 ml  Output 475 ml  Net 243 ml        Physical Exam: Vital Signs Blood pressure 111/65, pulse (!) 58, temperature (!) 97.5 F (36.4 C), temperature source Oral, resp. rate 16, height 5\' 7"  (1.702 m), weight 58.8 kg, SpO2 98%.    General: awake, alert, sitting in bed HENT: conjugate gaze; oropharynx moist.  Minimal conjunctival injection right eye CV: regular rate; no JVD Pulmonary: CTA B/L; no W/R/R- good air movement GI: soft, NT, ND, (+)BS Psychiatric: Appropriate, cooperative Neurological: alert  Skin: No evidence of breakdown, no evidence of rash Neurologic: Cranial nerves II through XII intact-hard of hearing, motor strength is 5/5 in bilateral deltoid, bicep, tricep, grip, hip flexor, knee extensors, ankle dorsiflexor and plantar flexor Sensory exam normal sensation to light touch  in bilateral upper and lower extremities Cerebellar exam normal finger to nose to finger on RIght , mild dysmetria left  Musculoskeletal: Full range of motion in all 4 extremities. No joint swelling   Assessment/Plan: 1. Functional deficits which require 3+ hours per day of interdisciplinary therapy in a comprehensive inpatient rehab setting. Physiatrist is  providing close team supervision and 24 hour management of active medical problems listed below. Physiatrist and rehab team continue to assess barriers to discharge/monitor patient progress toward functional and medical goals  Care Tool:  Bathing    Body parts bathed by patient: Right arm, Left arm, Chest, Face, Front perineal area, Abdomen, Right upper leg, Left upper leg, Right lower leg, Left lower leg   Body parts bathed by helper: Buttocks     Bathing assist Assist Level: Minimal Assistance - Patient > 75%     Upper Body Dressing/Undressing Upper body dressing   What is the patient wearing?: Pull over shirt    Upper body assist Assist Level: Minimal Assistance - Patient > 75%    Lower Body Dressing/Undressing Lower body dressing      What is the patient wearing?: Pants, Underwear/pull up     Lower body assist Assist for lower body dressing: Moderate Assistance - Patient 50 - 74%     Toileting Toileting    Toileting assist Assist for toileting: Minimal Assistance - Patient > 75%     Transfers Chair/bed transfer  Transfers assist     Chair/bed transfer assist level: Minimal Assistance - Patient > 75% Chair/bed transfer assistive device: Armrests, Environmental health practitioner  Ambulation   Ambulation assist      Assist level: Moderate Assistance - Patient 50 - 74% Assistive device: Walker-rolling Max distance: 147ft   Walk 10 feet activity   Assist     Assist level: Minimal Assistance - Patient > 75% Assistive device: Walker-rolling   Walk 50 feet activity   Assist    Assist level: Moderate Assistance - Patient - 50 - 74% Assistive device: Walker-rolling    Walk 150 feet activity   Assist Walk 150 feet activity did not occur: Safety/medical concerns         Walk 10 feet on uneven surface  activity   Assist Walk 10 feet on uneven surfaces activity did not occur: Safety/medical concerns         Wheelchair     Assist Is the  patient using a wheelchair?: Yes Type of Wheelchair: Manual    Wheelchair assist level: Dependent - Patient 0%      Wheelchair 50 feet with 2 turns activity    Assist        Assist Level: Dependent - Patient 0%   Wheelchair 150 feet activity     Assist      Assist Level: Dependent - Patient 0%   Blood pressure 111/65, pulse (!) 58, temperature (!) 97.5 F (36.4 C), temperature source Oral, resp. rate 16, height 5\' 7"  (1.702 m), weight 58.8 kg, SpO2 98%.  Medical Problem List and Plan: 1. 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  Antithrombotics: -DVT/anticoagulation:  Pharmaceutical: Lovenox             -antiplatelet therapy: DAPT x 3 weeks followed by Plavix alone.  3. Pain Management: tylenol prn.  4. Mood/Behavior/Sleep: LCSW to follow for evaluation and support.              -antipsychotic agents: N/A 5. Neuropsych/cognition: This patient is? capable of making decisions on his own behalf, however is very HOH and maybe sundowning 6. Skin/Wound Care: Routine pressure relief measures.  7. Fluids/Electrolytes/Nutrition: Monitor I/O. BMET looks normal     Latest Ref Rng & Units 04/14/2023    5:00 AM 04/13/2023    5:14 AM 04/11/2023    4:27 AM  BMP  Glucose 70 - 99 mg/dL 95  98  94   BUN 8 - 23 mg/dL 13  12  15    Creatinine 0.61 - 1.24 mg/dL 9.56  2.13  0.86   Sodium 135 - 145 mmol/L 132  134  131   Potassium 3.5 - 5.1 mmol/L 3.8  3.9  3.8   Chloride 98 - 111 mmol/L 101  103  100   CO2 22 - 32 mmol/L 27  25  27    Calcium 8.9 - 10.3 mg/dL 8.7  9.0  8.6     8. HTN: Monitor BP TID--metoprolol resumed on 11/26.      Vitals:   04/16/23 0443 04/16/23 1311  BP: 132/74 111/65  Pulse: (!) 54 (!) 58  Resp: 16 16  Temp: 98 F (36.7 C) (!) 97.5 F (36.4 C)   SpO2: 94% 98%  May need to ad additional BP  meds such as amlodipine if sys BP remains elevated   11/28- Will add Norvasc to start  5 mg daily- for elevated BP- will monitor trend  11/29- BP before AM meds, 140/69- otherwise, has been better controlled  11/30 BP well-controlled however patient is intermittently bradycardic.  Will decrease metoprolol to 12.5 mg twice daily 9. 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 Dyslipidemia: LDL-98-->now on Lipitor 80 mg daily 12. Non cardiac chest pain: Was started on metoprolol with prn NTG 11/15 by Dr. Melburn Popper.   13. GERD: Continue Pepcid. 14. 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  15. OSA?: Sleep study recommended at discharge.  16. Sundowning?  11/29- will try Seroquel 12.5 mg at bedtime prn for agitation/restlessness/lack of sleep  11/30 does not appear he got Seroquel last night, continue monitor 17. R dry eye/mild conjunctivitis  -Lubricating eyedrops started    LOS: 4 days A FACE TO FACE EVALUATION WAS PERFORMED  Fanny Dance 04/16/2023, 2:11 PM

## 2023-04-16 NOTE — Progress Notes (Signed)
Occupational Therapy Session Note  Patient Details  Name: Samuel Mata MRN: [id number] Date of Birth: 1931/01/12  {CHL IP REHAB OT TIME CALCULATIONS:304400400}  {CHL IP REHAB OT TIME CALCULATIONS:304400400}  Short Term Goals: Week 1:  OT Short Term Goal 1 (Week 1): Pt will perform sit<>stands using LRAD min A consistently OT Short Term Goal 2 (Week 1): Pt will maintain standing balance during BADLs min A OT Short Term Goal 3 (Week 1): Pt will maintain sitting balance during BADLs CGA to decrease overall bourdon of care OT Short Term Goal 4 (Week 1): Pt will complete LB dressing min A using LRAD  Skilled Therapeutic Interventions/Progress Updates:   Session 1: Pt greeted *** for skilled OT session with focus on ***.   Pain: Pt reported ***/10 pain, stating "***" in reference to ***. OT offering intermediate rest breaks and positioning suggestions throughout session to address pain/fatigue and maximize participation/safety in session.   Functional Transfers:  Self Care Tasks:  Therapeutic Activities:  Therapeutic Exercise:   Education:  Pt remained *** with 4Ps assessed and immediate needs met. Pt continues to be appropriate for skilled OT intervention to promote further functional independence in ADLs/IADLs.   Session 2: Pt greeted *** for skilled OT session with focus on ***.   Pain: Pt reported ***/10 pain, stating "***" in reference to ***. OT offering intermediate rest breaks and positioning suggestions throughout session to address pain/fatigue and maximize participation/safety in session.   Functional Transfers:  Self Care Tasks:  Therapeutic Activities:  Therapeutic Exercise:   Education:  Pt remained *** with 4Ps assessed and immediate needs met. Pt continues to be appropriate for skilled OT intervention to promote further functional independence in ADLs/IADLs.   Therapy Documentation Precautions:  Precautions Precautions: Fall, Other  (comment) Precaution Comments: HOH (hears better in R ear), sundowning, L inattention, L vs posterior lean Restrictions Weight Bearing Restrictions: No  Therapy/Group: Individual Therapy  Lou Cal, OTR/L, MSOT  04/16/2023, 12:44 PM

## 2023-04-16 NOTE — Progress Notes (Signed)
Speech Language Pathology Daily Session Note  Patient Details  Name: Samuel Mata MRN: [id number] Date of Birth: [birth year]  Today's Date: 04/16/2023 SLP Individual Time: 1345-1445 SLP Individual Time Calculation (min): 60 min  Short Term Goals: Week 1: SLP Short Term Goal 1 (Week 1): Patient will recall and utilize memory compensatory strategies given min multimodal A SLP Short Term Goal 2 (Week 1): Patient will increase processing speed during functional tasks given min multimodal A  Skilled Therapeutic Interventions: Skilled therapy session focused on cognitive goals. SLP faciliated session by providing supervision A during money management activity. SLP verbalized amount of money for patient to provide and patient completed task with ease given extra time. SLP challenged patient by encouraging him to complete simple mathematical calculations including addition/subtraction utilizing coins/bills. Patient able to self correct independently when mistakes were made. SLP addressed memory through review of memory strategies. Patient unable to recall memory strategies and SLP re-educated. SLP initiated use of memory book and provided min-modA for patient to write activities completed in each therapy session this date. Patient left in bed with alarm set and call bell in reach. Continue POC.   Pain None reported  Therapy/Group: Individual Therapy  Danay Mckellar M.A., CF-SLP 04/16/2023, 12:44 PM

## 2023-04-16 NOTE — Progress Notes (Signed)
Physical Therapy Session Note  Patient Details  Name: Samuel Mata MRN: [id number] Date of Birth: Sep 23, 1930  Today's Date: 04/16/2023 PT Individual Time: 0805-0919 PT Individual Time Calculation (min): 74 min   Short Term Goals: Week 1:  PT Short Term Goal 1 (Week 1): Pt will perform supine<>sit with min A PT Short Term Goal 2 (Week 1): Pt will perform sit<>stands using LRAD with min A PT Short Term Goal 3 (Week 1): Pt will perform bed<>chair transfer using LRAD with min A PT Short Term Goal 4 (Week 1): Pt will ambulate at least 66ft using LRAD with min A  Skilled Therapeutic Interventions/Progress Updates:    Pt received supported upright in bed with his daughter, Samuel Mata, present and pt agreeable to therapy session. Supine>sitting L EOB, HOB maximally elevated and using bedrail, with CGA. Once sitting EOB, pt continues to have L posterior lean bias requiring SBA to min A to maintain upright, during total A LB clothing and shoe donning. Nurse present for medication administration.   Sit>stands EOB>RW with pt requiring total step-by-step cuing to 1. Scoot forward 2. Bring feet back underneath him 3. Lean trunk forward towards front of RW while powering up. Continues to requires up to heavy min A for balance due to strong L posterior lean while rising but it is improved with the stated cuing. Min A for balance while pullings pants up over hips dependently.  Pt requesting to brush teeth. Gait ~98ft to sink using RW with heavy min A for balance due to continued L posterior lean resulting in poor L step length. Standing at sink with CGA-min A for balance with hips resting on counter during oral care - frequent cuing to activate L quads due to gradual crouch posture on that side causing worsening L lean.   Gait training ~22ft x2 in/out bathroom using RW with heavy min A for balance and AD management - continues to have strong L posterior lean with poor L step length. Standing min A with B UE  support on AD during dependent LB clothing management - continent of bladder, peri-care without assist.    Transported to/from gym in w/c for time management and energy conservation.  Gait training 129ft x2 (seated break) using +2 B HHA with +2 min assist for balance. Pt demonstrating the following gait deviations with therapist providing the described cuing and facilitation for improvement:  - continues to have L posterior lean bias - continues to have decreased L foot clearance during swing - cuing throughout for improved gait speed and for reciprocal stepping pattern  - added 4lb weight to L LE during 2nd gait trail to promote increased attention to that side and for error augmentation - therapist continuing to facilitate R weight shift onto R stance phase to improve L LE foot clearance  *Continues to try and sit prematurely when going towards the seat requiring +2 mod A for balance and facilitating turning fully due to strong L posterior lean and crouched posture!*  Stair navigation training ascending/descending 4 steps (6" height) using B HRs with skilled mod assist of 1 and +2 for safety - cuing for reciprocal pattern on ascent but pt unable to understand directions, instead performing step-to with varying lead LE - step-to pattern on descent leading with L LE and therapist mod/max A manually facilitating L foot onto next step while cuing for pt to bring R hip towards HR due to strong L posterior lean.   Gait training ~174ft back towards room using RW with heavy  min assist for balance and AD management with +2 w/c follow to allow increased distance. Pt demonstrating the following gait deviations with therapist providing the described cuing and facilitation for improvement:  - continues to try and push AD too far forward - has worsening trunk flexion with AD - therapist continuing to facilitate R weight shift onto R stance limb to improve L swing advancement - therapist assisting with RW  management throughout and cuing for pt to stay inside of AD and have better upright posture  Transported back to room remainder of distance, pt requesting to take a nap. L stand pivot w/c>EOB using bedrail and armrest with min A. Sit>supine with CGA. Pt left supine in bed with needs in reach, bed alarm on, and in the care of his daughter.   Therapy Documentation Precautions:  Precautions Precautions: Fall, Other (comment) Precaution Comments: HOH (hears better in R ear), sundowning, L inattention, L vs posterior lean Restrictions Weight Bearing Restrictions: No   Pain:  No reports of pain throughout session.    Therapy/Group: Individual Therapy  Ginny Forth , PT, DPT, NCS, CSRS 04/16/2023, 7:44 AM

## 2023-04-17 DIAGNOSIS — I10 Essential (primary) hypertension: Secondary | ICD-10-CM | POA: Diagnosis not present

## 2023-04-17 DIAGNOSIS — I6381 Other cerebral infarction due to occlusion or stenosis of small artery: Secondary | ICD-10-CM | POA: Diagnosis not present

## 2023-04-17 DIAGNOSIS — H1031 Unspecified acute conjunctivitis, right eye: Secondary | ICD-10-CM | POA: Diagnosis not present

## 2023-04-17 DIAGNOSIS — E871 Hypo-osmolality and hyponatremia: Secondary | ICD-10-CM

## 2023-04-17 DIAGNOSIS — R001 Bradycardia, unspecified: Secondary | ICD-10-CM | POA: Diagnosis not present

## 2023-04-17 MED ORDER — POLYVINYL ALCOHOL 1.4 % OP SOLN
2.0000 [drp] | Freq: Four times a day (QID) | OPHTHALMIC | Status: DC
Start: 2023-04-17 — End: 2023-04-29
  Administered 2023-04-17 – 2023-04-29 (×36): 2 [drp] via OPHTHALMIC
  Filled 2023-04-17: qty 15

## 2023-04-17 NOTE — Progress Notes (Signed)
PROGRESS NOTE   Subjective/Complaints:  No acute events overnight.  Patient reports he continues to have some dryness/irritation of the right eye.  Right eye not painful.  Does not appear he was given any as needed eyedrops recently, discussed that we will schedule.  LBM 11/28  ROS-denies fever, chills, chest pain, shortness of breath, abdominal pain, nausea, vomiting  Objective:   No results found. No results for input(s): "WBC", "HGB", "HCT", "PLT" in the last 72 hours.  No results for input(s): "NA", "K", "CL", "CO2", "GLUCOSE", "BUN", "CREATININE", "CALCIUM" in the last 72 hours.   Intake/Output Summary (Last 24 hours) at 04/17/2023 1405 Last data filed at 04/17/2023 1217 Gross per 24 hour  Intake 774 ml  Output 1160 ml  Net -386 ml        Physical Exam: Vital Signs Blood pressure 121/66, pulse 88, temperature (!) 97.5 F (36.4 C), temperature source Oral, resp. rate 16, height 5\' 7"  (1.702 m), weight 58.8 kg, SpO2 95%.    General: awake, alert, sitting in bedside chair HENT: conjugate gaze; oropharynx moist.  Mild conjunctival injection right eye.  No significant drainage noted CV: regular rate; no JVD Pulmonary: CTA B/L; no W/R/R- good air movement GI: soft, NT, ND, (+)BS Psychiatric: Appropriate, cooperative Neurological: alert  Skin: No evidence of breakdown, no evidence of rash Neurologic: Cranial nerves II through XII intact-hard of hearing, motor strength is 5/5 in bilateral deltoid, bicep, tricep, grip, hip flexor, knee extensors, ankle dorsiflexor and plantar flexor Sensory exam normal sensation to light touch  in bilateral upper and lower extremities Cerebellar exam normal finger to nose to finger on RIght , mild dysmetria left  Musculoskeletal: Full range of motion in all 4 extremities. No joint swelling   Assessment/Plan: 1. Functional deficits which require 3+ hours per day of interdisciplinary  therapy in a comprehensive inpatient rehab setting. Physiatrist is providing close team supervision and 24 hour management of active medical problems listed below. Physiatrist and rehab team continue to assess barriers to discharge/monitor patient progress toward functional and medical goals  Care Tool:  Bathing    Body parts bathed by patient: Right arm, Left arm, Chest, Face, Front perineal area, Abdomen, Right upper leg, Left upper leg, Right lower leg, Left lower leg   Body parts bathed by helper: Buttocks     Bathing assist Assist Level: Minimal Assistance - Patient > 75%     Upper Body Dressing/Undressing Upper body dressing   What is the patient wearing?: Pull over shirt    Upper body assist Assist Level: Minimal Assistance - Patient > 75%    Lower Body Dressing/Undressing Lower body dressing      What is the patient wearing?: Pants, Underwear/pull up     Lower body assist Assist for lower body dressing: Moderate Assistance - Patient 50 - 74%     Toileting Toileting    Toileting assist Assist for toileting: Minimal Assistance - Patient > 75%     Transfers Chair/bed transfer  Transfers assist     Chair/bed transfer assist level: Minimal Assistance - Patient > 75% Chair/bed transfer assistive device: Armrests, Geologist, engineering   Ambulation assist  Assist level: Moderate Assistance - Patient 50 - 74% Assistive device: Walker-rolling Max distance: 1102ft   Walk 10 feet activity   Assist     Assist level: Minimal Assistance - Patient > 75% Assistive device: Walker-rolling   Walk 50 feet activity   Assist    Assist level: Moderate Assistance - Patient - 50 - 74% Assistive device: Walker-rolling    Walk 150 feet activity   Assist Walk 150 feet activity did not occur: Safety/medical concerns         Walk 10 feet on uneven surface  activity   Assist Walk 10 feet on uneven surfaces activity did not occur:  Safety/medical concerns         Wheelchair     Assist Is the patient using a wheelchair?: Yes Type of Wheelchair: Manual    Wheelchair assist level: Dependent - Patient 0%      Wheelchair 50 feet with 2 turns activity    Assist        Assist Level: Dependent - Patient 0%   Wheelchair 150 feet activity     Assist      Assist Level: Dependent - Patient 0%   Blood pressure 121/66, pulse 88, temperature (!) 97.5 F (36.4 C), temperature source Oral, resp. rate 16, height 5\' 7"  (1.702 m), weight 58.8 kg, SpO2 95%.  Medical Problem List and Plan: 1. 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  Antithrombotics: -DVT/anticoagulation:  Pharmaceutical: Lovenox             -antiplatelet therapy: DAPT x 3 weeks followed by Plavix alone.  3. Pain Management: tylenol prn.  4. Mood/Behavior/Sleep: LCSW to follow for evaluation and support.              -antipsychotic agents: N/A 5. Neuropsych/cognition: This patient is? capable of making decisions on his own behalf, however is very HOH and maybe sundowning 6. Skin/Wound Care: Routine pressure relief measures.  7. Fluids/Electrolytes/Nutrition: Monitor I/O. BMET looks normal     Latest Ref Rng & Units 04/14/2023    5:00 AM 04/13/2023    5:14 AM 04/11/2023    4:27 AM  BMP  Glucose 70 - 99 mg/dL 95  98  94   BUN 8 - 23 mg/dL 13  12  15    Creatinine 0.61 - 1.24 mg/dL 1.61  0.96  0.45   Sodium 135 - 145 mmol/L 132  134  131   Potassium 3.5 - 5.1 mmol/L 3.8  3.9  3.8   Chloride 98 - 111 mmol/L 101  103  100   CO2 22 - 32 mmol/L 27  25  27    Calcium 8.9 - 10.3 mg/dL 8.7  9.0  8.6     8. HTN: Monitor BP TID--metoprolol resumed on 11/26.      Vitals:   04/17/23 0310 04/17/23 1356  BP: (!) 153/70 121/66  Pulse:  69 88  Resp: 16 16  Temp: 98.4 F (36.9 C) (!) 97.5 F (36.4 C)  SpO2: 93% 95%  May need to ad additional BP meds such as amlodipine if sys BP remains elevated   11/28-  Will add Norvasc to start  5 mg daily- for elevated BP- will monitor trend  11/29- BP before AM meds, 140/69- otherwise, has been better controlled  11/30 BP well-controlled however patient is intermittently bradycardic.  Will decrease metoprolol to 12.5 mg twice daily  12/1 patient had one elevated BP reading of 153/70 however overall appears under control, bradycardia appears to be improved today 9. 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 Dyslipidemia: LDL-98-->now on Lipitor 80 mg daily 12. Non cardiac chest pain: Was started on metoprolol with prn NTG 11/15 by Dr. Melburn Popper.   13. GERD: Continue Pepcid. 14. 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  15. OSA?: Sleep study recommended at discharge.  16. Sundowning?  11/29- will try Seroquel 12.5 mg at bedtime prn for agitation/restlessness/lack of sleep  12/1 patient received Seroquel last night.   Do not see any notes from nursing regarding behavior 17. R dry eye/mild conjunctivitis  -Lubricating eyedrops started-medication was scheduled    LOS: 5 days A FACE TO FACE EVALUATION WAS PERFORMED  Fanny Dance 04/17/2023, 2:05 PM

## 2023-04-17 NOTE — Progress Notes (Signed)
Physical Therapy Session Note  Patient Details  Name: Samuel Mata MRN: [id number] Date of Birth: 01-05-1931  Today's Date: 04/17/2023 PT Individual Time: 1345-1441 PT Individual Time Calculation (min): 56 min   Short Term Goals: Week 1:  PT Short Term Goal 1 (Week 1): Pt will perform supine<>sit with min A PT Short Term Goal 2 (Week 1): Pt will perform sit<>stands using LRAD with min A PT Short Term Goal 3 (Week 1): Pt will perform bed<>chair transfer using LRAD with min A PT Short Term Goal 4 (Week 1): Pt will ambulate at least 57ft using LRAD with min A  Skilled Therapeutic Interventions/Progress Updates:      Therapy Documentation Precautions:  Precautions Precautions: Fall, Other (comment) Precaution Comments: HOH (hears better in R ear), sundowning, L inattention, L vs posterior lean Restrictions Weight Bearing Restrictions: No   PT session with emphasis on dynamic standing balance and NMR to improve postural control. Pt without verbal reports of pain and mod A with stand pivot no AD to w/c. Pt dependently transported for time management.   Pt requires min A with sit<>stand on descending wedge to facilitate increased weight bearing on forefoot to correct posterior bias. In position pt retrieve squigs positioned on mirror involving contralateral and ipsilateral reaching above and below waist.   Pt min/mod A with above set up while holding a cone with verbal and tactile cue to push cone outward from chest to increase forward trunk flexion with transfer x 3.   Pt returned to room and mod A with squat pivot to w/c>recliner. Pt left with all needs in reach, alarm on and daughter present.    Therapy/Group: Individual Therapy  Truitt Leep Truitt Leep PT, DPT  04/17/2023, 8:00 AM

## 2023-04-18 DIAGNOSIS — I6381 Other cerebral infarction due to occlusion or stenosis of small artery: Secondary | ICD-10-CM | POA: Diagnosis not present

## 2023-04-18 DIAGNOSIS — F05 Delirium due to known physiological condition: Secondary | ICD-10-CM | POA: Diagnosis not present

## 2023-04-18 DIAGNOSIS — I10 Essential (primary) hypertension: Secondary | ICD-10-CM | POA: Diagnosis not present

## 2023-04-18 LAB — BASIC METABOLIC PANEL
Anion gap: 7 (ref 5–15)
BUN: 18 mg/dL (ref 8–23)
CO2: 29 mmol/L (ref 22–32)
Calcium: 9.6 mg/dL (ref 8.9–10.3)
Chloride: 100 mmol/L (ref 98–111)
Creatinine, Ser: 0.87 mg/dL (ref 0.61–1.24)
GFR, Estimated: >60 mL/min (ref >60)
Glucose, Bld: 96 mg/dL (ref 70–99)
Potassium: 4.4 mmol/L (ref 3.5–5.1)
Sodium: 136 mmol/L (ref 135–145)

## 2023-04-18 LAB — CBC
HCT: 44.8 % (ref 39.0–52.0)
Hemoglobin: 14.8 g/dL (ref 13.0–17.0)
MCH: 30.5 pg (ref 26.0–34.0)
MCHC: 33 g/dL (ref 30.0–36.0)
MCV: 92.4 fL (ref 80.0–100.0)
Platelets: 505 10*3/uL — ABNORMAL HIGH (ref 150–400)
RBC: 4.85 MIL/uL (ref 4.22–5.81)
RDW: 12.1 % (ref 11.5–15.5)
WBC: 9.3 10*3/uL (ref 4.0–10.5)
nRBC: 0 % (ref 0.0–0.2)

## 2023-04-18 MED ORDER — MAGNESIUM HYDROXIDE 400 MG/5ML PO SUSP
30.0000 mL | Freq: Once | ORAL | Status: AC
  Administered 2023-04-18: 30 mL via ORAL
  Filled 2023-04-18: qty 30

## 2023-04-18 MED ORDER — POLYETHYLENE GLYCOL 3350 17 G PO PACK
17.0000 g | PACK | Freq: Every day | ORAL | Status: DC
  Administered 2023-04-19 – 2023-04-29 (×11): 17 g via ORAL
  Filled 2023-04-18 (×11): qty 1

## 2023-04-18 MED ORDER — MAGNESIUM HYDROXIDE 400 MG/5ML PO SUSP: 30.0000 mL | Freq: Every day | ORAL | Status: DC | PRN

## 2023-04-18 MED ORDER — SENNOSIDES-DOCUSATE SODIUM 8.6-50 MG PO TABS: 2.0000 | ORAL_TABLET | Freq: Every day | ORAL | Status: DC | PRN

## 2023-04-18 MED ORDER — MELATONIN 3 MG PO TABS
3.0000 mg | ORAL_TABLET | Freq: Every day | ORAL | Status: DC
  Administered 2023-04-18 – 2023-04-28 (×11): 3 mg via ORAL
  Filled 2023-04-18 (×11): qty 1

## 2023-04-18 NOTE — Plan of Care (Signed)
  Problem: RH Swallowing Goal: LTG Patient will consume least restrictive diet using compensatory strategies with assistance (SLP) Description: LTG:  Patient will consume least restrictive diet using compensatory strategies with assistance (SLP) Flowsheets (Taken 04/18/2023 0829) LTG: Pt Patient will consume least restrictive diet using compensatory strategies with assistance of (SLP): Supervision

## 2023-04-18 NOTE — Progress Notes (Signed)
Speech Language Pathology Daily Session Note  Patient Details  Name: Samuel Mata MRN: [id number] Date of Birth: 1931-04-18  Today's Date: 04/18/2023 SLP Individual Time: 0726-0825 SLP Individual Time Calculation (min): 59 min  Short Term Goals: Week 1: SLP Short Term Goal 1 (Week 1): Patient will recall and utilize memory compensatory strategies given min multimodal A SLP Short Term Goal 2 (Week 1): Patient will increase processing speed during functional tasks given min multimodal A SLP Short Term Goal 3 (Week 1): Pt will demosntrate awareness of any pocketing of solids in L cheek pocket and clear residue with min cues as needed.  Skilled Therapeutic Interventions:   Pt seen for skilled SLP evaluation to complete clinical swallow assessment and cognitive exercises.   Swallowing: per daughter, pt has demonstrated some instances of L buccal pocketing and resultant coughing due to pocketing. They have not noticed any anterior loss with liquids or concerns for aspiration with liquids. SLP observed pt consume breakfast tray containing liquids, puree solid, and soft solid (D 3 consistency).  Throughout meal, no significant oral residue was observed or pocketing; however, pt may be able to easily manage softer items. Observed occasional delayed cough or throat clear following solids, though daughter reported this is pt's baseline. We discussed continuing a regular diet at this time and monitoring for oral pocketing or concerns for aspiration over the next few sessions. Pt and daughter also educated on s/s of esophageal dysphagia, though pt does not have a hx of esophageal dilation. He does take Pepcid for general indigestion. Goals added to PoC for swallowing at this time.   Cognition: Pt complete visual safety awareness tasks x2 with 100% accuracy independently. Per daughter, pt is very vigilant about home safety including double checking to ensure all appliances are turned off and all doors are  locked. 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 We discussed various safety situations within pt's own home and he provided logical responses to all questions. Pt would benefit from medication management task given he manages this independently at home.   Pt left sitting upright in recliner with chair alarm in place and daughter at bedside. Continue SLP PoC.   Pain Pain Assessment Pain Scale: 0-10 Pain Score: 0-No pain  Therapy/Group: Individual Therapy  Ellery Plunk 04/18/2023, 8:28 AM

## 2023-04-18 NOTE — Progress Notes (Signed)
Physical Therapy Session Note  Patient Details  Name: Samuel Mata MRN: [id number] Date of Birth: 1930-09-15  Today's Date: 04/18/2023 PT Individual Time: 0831-0857 PT Individual Time Calculation (min): 26 min   Short Term Goals: Week 1:  PT Short Term Goal 1 (Week 1): Pt will perform supine<>sit with min A PT Short Term Goal 2 (Week 1): Pt will perform sit<>stands using LRAD with min A PT Short Term Goal 3 (Week 1): Pt will perform bed<>chair transfer using LRAD with min A PT Short Term Goal 4 (Week 1): Pt will ambulate at least 11ft using LRAD with min A  Skilled Therapeutic Interventions/Progress Updates:   Received pt sitting in recliner with daughter at bedside. Pt agreeable to PT treatment and denied any pain during session. Session with emphasis on functional mobility/transfers, generalized strengthening and endurance, and dynamic standing balance/coordination. Stood from Medical illustrator with RW and min A x 3 trials - cues to scoot to edge of chair, get feet underneath him, lean forward to stand, and for hand placement on RW. Pt with posterior and L lateral lean and able to correct with increased time, mod/max multimodal cues for posture/positioning, and min A. Pt performed the following exercises with emphasis on LE strength and midline orientation: -marches x10 -mini squats x9 to fatigue -hip abduction x8 bilaterally Pt requested to return to bed and daughter requesting pt attempt to toilet prior to returning to bed. NT notified due to time restrictions and pt left sitting in recliner, needs within reach, and daughter at bedside awaiting NT.   Therapy Documentation Precautions:  Precautions Precautions: Fall, Other (comment) Precaution Comments: HOH (hears better in R ear), sundowning, L inattention, L vs posterior lean Restrictions Weight Bearing Restrictions: No  Therapy/Group: Individual Therapy Marlana Salvage Zaunegger Blima Rich PT, DPT 04/18/2023, 7:11 AM

## 2023-04-18 NOTE — Progress Notes (Signed)
PROGRESS NOTE   Subjective/Complaints:  TIred, states he did not sleep well last noc , no nursing notes   ROS-denies fever, chills, chest pain, shortness of breath, abdominal pain, nausea, vomiting  Objective:   No results found. Recent Labs    04/18/23 0623  WBC 9.3  HGB 14.8  HCT 44.8  PLT 505*    Recent Labs    04/18/23 0623  NA 136  K 4.4  CL 100  CO2 29  GLUCOSE 96  BUN 18  CREATININE 0.87  CALCIUM 9.6     Intake/Output Summary (Last 24 hours) at 04/18/2023 0930 Last data filed at 04/18/2023 0902 Gross per 24 hour  Intake 718 ml  Output 100 ml  Net 618 ml        Physical Exam: Vital Signs Blood pressure (!) 151/85, pulse 66, temperature 97.8 F (36.6 C), resp. rate 17, height 5\' 7"  (1.702 m), weight 58.8 kg, SpO2 95%.   General: No acute distress Mood and affect are appropriate Heart: Regular rate and rhythm no rubs murmurs or extra sounds Lungs: Clear to auscultation, breathing unlabored, no rales or wheezes Abdomen: Positive bowel sounds, soft nontender to palpation, nondistended Extremities: No clubbing, cyanosis, or edema   Somnolent but following commands , has had 2 therapies this am an dis napping  Skin: No evidence of breakdown, no evidence of rash Neurologic: Cranial nerves II through XII intact-hard of hearing, motor strength is 5/5 in bilateral deltoid, bicep, tricep, grip, hip flexor, knee extensors, ankle dorsiflexor and plantar flexor  Musculoskeletal: Full range of motion in all 4 extremities. No joint swelling   Assessment/Plan: 1. Functional deficits which require 3+ hours per day of interdisciplinary therapy in a comprehensive inpatient rehab setting. Physiatrist is providing close team supervision and 24 hour management of active medical problems listed below. Physiatrist and rehab team continue to assess barriers to discharge/monitor patient progress toward functional  and medical goals  Care Tool:  Bathing    Body parts bathed by patient: Right arm, Left arm, Chest, Face, Front perineal area, Abdomen, Right upper leg, Left upper leg, Right lower leg, Left lower leg   Body parts bathed by helper: Buttocks     Bathing assist Assist Level: Minimal Assistance - Patient > 75%     Upper Body Dressing/Undressing Upper body dressing   What is the patient wearing?: Pull over shirt    Upper body assist Assist Level: Minimal Assistance - Patient > 75%    Lower Body Dressing/Undressing Lower body dressing      What is the patient wearing?: Pants, Underwear/pull up     Lower body assist Assist for lower body dressing: Moderate Assistance - Patient 50 - 74%     Toileting Toileting    Toileting assist Assist for toileting: Minimal Assistance - Patient > 75%     Transfers Chair/bed transfer  Transfers assist     Chair/bed transfer assist level: Minimal Assistance - Patient > 75% Chair/bed transfer assistive device: Armrests, Geologist, engineering   Ambulation assist      Assist level: Moderate Assistance - Patient 50 - 74% Assistive device: Walker-rolling Max distance: 195ft   Walk 10  feet activity   Assist     Assist level: Minimal Assistance - Patient > 75% Assistive device: Walker-rolling   Walk 50 feet activity   Assist    Assist level: Moderate Assistance - Patient - 50 - 74% Assistive device: Walker-rolling    Walk 150 feet activity   Assist Walk 150 feet activity did not occur: Safety/medical concerns         Walk 10 feet on uneven surface  activity   Assist Walk 10 feet on uneven surfaces activity did not occur: Safety/medical concerns         Wheelchair     Assist Is the patient using a wheelchair?: Yes Type of Wheelchair: Manual    Wheelchair assist level: Dependent - Patient 0%      Wheelchair 50 feet with 2 turns activity    Assist        Assist Level:  Dependent - Patient 0%   Wheelchair 150 feet activity     Assist      Assist Level: Dependent - Patient 0%   Blood pressure (!) 151/85, pulse 66, temperature 97.8 F (36.6 C), resp. rate 17, height 5\' 7"  (1.702 m), weight 58.8 kg, SpO2 95%.  Medical Problem List and Plan: 1. 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 If pt is tolerating full CIR schedule may need 15/7 2.  Antithrombotics: -DVT/anticoagulation:  Pharmaceutical: Lovenox             -antiplatelet therapy: DAPT x 3 weeks followed by Plavix alone.  3. Pain Management: tylenol prn.  4. Mood/Behavior/Sleep: LCSW to follow for evaluation and support.              -antipsychotic agents: N/A Somnolent states he did not sleep well check sleep graph , add melatonin, no sedating meds noted,  5. Neuropsych/cognition: This patient is? capable of making decisions on his own behalf, however is very HOH and maybe sundowning 6. Skin/Wound Care: Routine pressure relief measures.  7. Fluids/Electrolytes/Nutrition: Monitor I/O. BMET looks normal     Latest Ref Rng & Units 04/18/2023    6:23 AM 04/14/2023    5:00 AM 04/13/2023    5:14 AM  BMP  Glucose 70 - 99 mg/dL 96  95  98   BUN 8 - 23 mg/dL 18  13  12    Creatinine 0.61 - 1.24 mg/dL 5.62  1.30  8.65   Sodium 135 - 145 mmol/L 136  132  134   Potassium 3.5 - 5.1 mmol/L 4.4  3.8  3.9   Chloride 98 - 111 mmol/L 100  101  103   CO2 22 - 32 mmol/L 29  27  25    Calcium 8.9 - 10.3 mg/dL 9.6  8.7  9.0     8. HTN: Monitor BP TID--metoprolol resumed on 11/26.      Vitals:   04/17/23 1951 04/18/23 0439  BP: 121/62 (!) 151/85  Pulse: 78 66  Resp: 18 17  Temp: 98.5 F (36.9 C) 97.8 F (36.6 C)  SpO2: 94% 95%   Some lability , on lower dose metoprolol 12.5 mg BID and amlodipine 5mg  daily 9. 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 Dyslipidemia: LDL-98-->now on Lipitor 80 mg daily 12. Non cardiac chest pain: Was started on metoprolol with prn NTG 11/15 by Dr. Melburn Popper.   13. GERD: Continue Pepcid. 14. 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  15. OSA?: Sleep study recommended at discharge.  16. Sundowning?  11/29- will try Seroquel 12.5 mg at bedtime prn for agitation/restlessness/lack of sleep  12/1 patient received Seroquel last night.   Add melatonin 12/2 17. R dry eye/mild conjunctivitis  -Lubricating eyedrops started-medication was scheduled    LOS: 6 days A FACE TO FACE EVALUATION WAS PERFORMED  Erick Colace 04/18/2023, 9:30 AM

## 2023-04-18 NOTE — Progress Notes (Signed)
Occupational Therapy Session Note  Patient Details  Name: Samuel Mata MRN: [id number] Date of Birth: [birth year]  Today's Date: 04/18/2023 OT Individual Time: 2440-1027 OT Individual Time Calculation (min): 57 min  And OT Individual Time: 1451-1540 OT Individual Time Calculation (min): 49 min   Short Term Goals: Week 1:  OT Short Term Goal 1 (Week 1): Pt will perform sit<>stands using LRAD min A consistently OT Short Term Goal 2 (Week 1): Pt will maintain standing balance during BADLs min A OT Short Term Goal 3 (Week 1): Pt will maintain sitting balance during BADLs CGA to decrease overall bourdon of care OT Short Term Goal 4 (Week 1): Pt will complete LB dressing min A using LRAD  Skilled Therapeutic Interventions/Progress Updates:     AM Session:  Pt received deeply sleeping in bed with both DTRs present in room upon OT arrival. [name] DTR's reporting Pt did not sleep well last night. Pt requiring increased time to wake and initiate participation in therapy session this AM, however once awake Pt presenting to be in good spirits receptive to skilled OT session reporting 0/10 pain- OT offering intermittent rest breaks, repositioning, and therapeutic support to optimize participation in therapy session. Focus this session BADL retraining, functional cognition, dynamic balance, and midline orientation.   Pt requesting to change clothes and brush teeth during session declining need to use BR. Pt transitioned to EOB using bed features with light min A and max verbal cues to anteriorly translate hips towards EOB by completing lateral leans to increase [name] stability and balance sitting EOB. Worked on lateral weight shifting and dynamic sitting balance EOB with Pt requiring max verbal and tactile cues to weight shift towards the R and anteriorly to compensate for [name] L heavy and posterior lean. Pt unaware of posterior L lean despite maximal cues during session. Pt doffed shirt sitting EOB with mod A  provided for sitting balance. Donned clean shirt with assistance required for sitting balance d/t L lean. Pt easily distracted by internal and external stimuli during task. Pt attempting to weave B LEs into pants by reaching forwards towards ground to weave feet, however Pt unsuccessful and reporting fear of falling d/t sitting balance deficits. Education provided on modified technique of crossing B LEs into figure-four position with improvement noted, however assistance required to weave L LE d/t tight fit of jogger pants. Pt stood with min A using RW with max verbal cues required for correct hand placement and max tactile cues to facilitate anterior weight shifting. In standing, Pt presenting with heavy posterior and L lean with max verbal and tactile cues required to correct. Worked on alternating releasing R/L UE from RW to bring pants to waist with heavy mod A required for standing balance. Seated rest break provided following.   Engaged Pt in completing functional mobility to wc using RW for functional mobility training to increase safety and independence at d/c. Pt required mod A with max verbal cues for L LE advancement, RW proximity, problem solving through turning with RW, and max tactile cues for lateral weight shifting to R to compensate for posterior L lateral lean.   Positioned Pt at sink in wc for grooming/hygiene tasks. Pt able to locate toothbrush, hair brush, and tooth paste located on L side of sink with min questioning cues. Pt completed oral care, groomed hair, and washed his face with supervision +increased time for problem solving in quiet environment.   Remainder of session completed in [name] room with door closed d/t Pt  becoming easily distracted in hospital. Engaged Pt in dynamic standing balance activity using RW with external feedback provided by mirror to increase [name] midline awareness and support improved alignment. Pt able to tolerate standing ~2 minutes x2 trials with max verbal  and tactile cues required to facilitate anterior and R weighted shifting. While in standing, worked on releasing single UE support alternating between R/L UE to simulate skills required for BADLs, however this extremely challenging for Pt with heavy mod A required for balance and max cues provided midline orientation, weight shifting, and postural alignment.   Pt handed off directly to PT entering room at end of session fro upcoming PT session with all needs met.    PM Session:  Pt received deeply sleeping in bed with DTR, [name], present in room upon OT arrival. Pt requiring increased time to wake and initiate participation in session with warm wash cloth applied to [name] face to increase arousal. Once awake, Pt presenting to be in good spirits receptive to skilled OT session reporting 0/10 pain- OT offering intermittent rest breaks, repositioning, and therapeutic support to optimize participation in therapy session. Focus this session d/c planning, dynamic balance, and L side attention.   Spent time during session discussing potential d/c plans with [name] DTR and allowing [name] DTR to observe session to gain better understanding of [name] functional levels. Following session, [name] DTR reporting that at this time, the family does not have 24/7 supervision arranged by individual's that can provide physical assistance. D/t [name] functional status 2/2 his CVA, he will likely need CGA-min A for functional mobility and BADLs at time of d/c. [name] DTR is continuing to look into family supports for d/c home 24/7 physical and cognitive support, however she is receptive to looking into alternative d/c locations if 24/7 is not available at time of d/c.   Pt completed bed mobility during session with supervision using bed rails with mod verbal cues provided fro technique. Pt completed multiple stand pivot transfer during session using RW with heavy min A to mod A required for balance and RW management. Pt presents with  heavy posterior and L lateral lean with decreased L side awareness and poor problem solving skills to notice when he is losing his balance or for RW management. Decreased step length and step height noted during transfer with improvement noted when given max verbal, tactile, and visual cues.   Sitting EOM during session, engaged Pt in ball toss activity to work on postural alignment, trunk control, B UE reciprocal use, and midline orientation. Pt able to tolerate completing 2 minutes x2 trials of seated ball toss with consistent verbal cues required for trunk alignment and midline orientation to prevent lateral, posterior lean. Pt then completed dynamic sitting balance activity sitting EOM with inferior reaching incorporated into task to simulate skills required for BADLs. Pt instructed to reach inferiorly towards ground to retrieve squigz from vertical mirror and then return trunk to midline passing squigz across midline to therapist. Pt completed task with CGA and max verbal and tactile cues for trunk alignment. Graded up task to incorporate standing in combination with anterior functional reaching for increased balance challenge. Pt able to complete task with min A for dynamic standing balance using RW, however consistent verbal and tactile cues required for midline orientation, weight shifting, and L side attention.   Pt was left resting in recliner with call bell in reach, seatbelt alarm on, and all needs met.    Therapy Documentation Precautions:  Precautions Precautions: Fall,  Other (comment) Precaution Comments: HOH (hears better in R ear), sundowning, L inattention, L vs posterior lean Restrictions Weight Bearing Restrictions: No  Therapy/Group: Individual Therapy  [name] 04/18/2023, 7:54 AM

## 2023-04-18 NOTE — Progress Notes (Signed)
Physical Therapy Session Note  Patient Details  Name: Samuel Mata MRN: [id number] Date of Birth: 1930-08-21  Today's Date: 04/18/2023 PT Individual Time: 1117-1157 PT Individual Time Calculation (min): 40 min   Short Term Goals: Week 1:  PT Short Term Goal 1 (Week 1): Pt will perform supine<>sit with min A PT Short Term Goal 2 (Week 1): Pt will perform sit<>stands using LRAD with min A PT Short Term Goal 3 (Week 1): Pt will perform bed<>chair transfer using LRAD with min A PT Short Term Goal 4 (Week 1): Pt will ambulate at least 2ft using LRAD with min A  Skilled Therapeutic Interventions/Progress Updates:    Chart reviewed and pt agreeable to therapy. Pt received seated in WC with no c/o pain. Also of note, pt received at end of OT session for direct hand off. Session focused on standing and sitting balance to decreased posterior and L lateral lean and promote safe balance for mobility around home. Pt initiated session with standing balance practice. Pt cued to used vertical lines along walls and doors as VC for alignment. Pt able to correct standing balance posture with this cue. Pt completed dynamic balance tasks of R/l lateral leans, marching, and reaches. Pt noted to require CGA + B handrail diminishing to modA with fatigue. Pt able to explain back how to use vertical lines to assist balance. Pt then transferred to recliner using minA + RW. Pt then completed continued standing balance practice with narrow stance, and staggered stance with min-modA + RW. Pt then completed seated balance exercises of reaching and lateral leans with CGA for all directions. Pt able to use vertical lines to assist realignment when necessary. At end of session, pt was left seated in recliner with alarm engaged, nurse call bell and all needs in reach.     Therapy Documentation Precautions:  Precautions Precautions: Fall, Other (comment) Precaution Comments: HOH (hears better in R ear), sundowning, L  inattention, L vs posterior lean Restrictions Weight Bearing Restrictions: No     Therapy/Group: Individual Therapy  Dionne Milo, PT, DPT 04/18/2023, 12:05 PM

## 2023-04-19 DIAGNOSIS — I6381 Other cerebral infarction due to occlusion or stenosis of small artery: Secondary | ICD-10-CM | POA: Diagnosis not present

## 2023-04-19 DIAGNOSIS — I10 Essential (primary) hypertension: Secondary | ICD-10-CM | POA: Diagnosis not present

## 2023-04-19 DIAGNOSIS — F05 Delirium due to known physiological condition: Secondary | ICD-10-CM | POA: Diagnosis not present

## 2023-04-19 NOTE — Plan of Care (Signed)
  Problem: RH Problem Solving Goal: LTG Patient will demonstrate problem solving for (SLP) Description: LTG:  Patient will demonstrate problem solving for basic/complex daily situations with cues  (SLP) Flowsheets (Taken 04/19/2023 1158) LTG: Patient will demonstrate problem solving for (SLP): (mildly complex) Other (comment) LTG Patient will demonstrate problem solving for: Minimal Assistance - Patient > 75%

## 2023-04-19 NOTE — Progress Notes (Signed)
Occupational Therapy Session Note  Patient Details  Name: Chrys Piehler MRN: [id number] Date of Birth: 23-Dec-1930  Today's Date: 04/19/2023 OT Individual Time: 4098-1191 OT Individual Time Calculation (min): 40 min    Short Term Goals: Week 1:  OT Short Term Goal 1 (Week 1): Pt will perform sit<>stands using LRAD min A consistently OT Short Term Goal 2 (Week 1): Pt will maintain standing balance during BADLs min A OT Short Term Goal 3 (Week 1): Pt will maintain sitting balance during BADLs CGA to decrease overall bourdon of care OT Short Term Goal 4 (Week 1): Pt will complete LB dressing min A using LRAD  Skilled Therapeutic Interventions/Progress Updates:     Pt received sitting up in recliner upon OT arrival, dressed for the day with DTR, [name], present in room. [name] DTR leaving during session to allow [name] to focus attention functional tasks with minimal distractions. Pt presenting to be in good spirits receptive to skilled OT session reporting 0/10 pain, however fatigued stating "I didn't sleep well last night"- OT offering intermittent rest breaks, repositioning, and therapeutic support to optimize participation in therapy session. Focus this session functional mobility training, functional cognition, and BADL retraining.   Pt completed functional mobility in room using RW ~20 ft recliner>wc for endurance and functional mobility training. Min A required to power up during sit>stand with heavy posterior lean noted once in standing position requiring mod A to maintain balance. Worked on anterior weight shifting and midline awareness with Pt progressing to min A for standing balance with max verbal and tactile cues. During functional mobility, utilize external visual cues, verbal, and tactile cues to facilitate increased step length with L LE, RW management, problem solving, and to decreased posterior/L lean with heavy min A required overall.   Pt requesting to shave and wash face during  session. Positioned Pt at sink in wc for energy conservation and to increase [name] safety during grooming/hygiene tasks. Pt able to shave, wash face, and groom hair with supervision with min verbal cues required for awareness. Pt maintained sustained attention in quiet environment for >8 minutes during BADLs with min verbal cues.   Engaged Pt in dynamic standing balance and functional reaching dual tasking activity with blocked practice of sit<>stands incorporated into task to increase [name] safety and independence in BADLs. Pt positioned in front of mirror in his room for improved external visual feedback to support increased body awareness. Focused activity on hand placement during sit<>stands, anterior weight shifting to decrease posterior push and L lateral push, and midline orientation. Pt completed 4 sit<>stands with min A with mod verbal and tactile cues for technique. Pt with improved anterior weight shifting during seconds two trials. In standing, Pt instructed to alternate R/L UE anterior reaching towards therapists hand for increased balance challenge with min A provided overall.   Pt was left resting in wc with call bell in reach, seatbelt alarm on, and all needs met.    Therapy Documentation Precautions:  Precautions Precautions: Fall, Other (comment) Precaution Comments: HOH (hears better in R ear), sundowning, L inattention, L vs posterior lean Restrictions Weight Bearing Restrictions: No   Therapy/Group: Individual Therapy  [name] 04/19/2023, 7:56 AM

## 2023-04-19 NOTE — Progress Notes (Signed)
Speech Language Pathology Daily Session Note  Patient Details  Name: Samuel Mata MRN: [id number] Date of Birth: 1930-12-07  Today's Date: 04/19/2023 SLP Individual Time: 4098-1191 SLP Individual Time Calculation (min): 30 min  Short Term Goals: Week 1: SLP Short Term Goal 1 (Week 1): Patient will recall and utilize memory compensatory strategies given min multimodal A SLP Short Term Goal 2 (Week 1): Patient will increase processing speed during functional tasks given min multimodal A SLP Short Term Goal 3 (Week 1): Pt will demosntrate awareness of any pocketing of solids in L cheek pocket and clear residue with min cues as needed.  Skilled Therapeutic Interventions: Skilled therapy session focused on cognitive goals. SLP facilitated session by providing min-modA during identification of medication mistakes in BID pillbox. Initially patient required minA however as complexity increased cuing increased to modA. Patient reported difficulty due to similar color complexity of various pills. SLP to re-administer similar activity next session with clear color distinctions. Problem solving goal added to POC due to patients prior level of cognitive independence. SLP addressed memory goals through completion of memory book and review of therapy activities completed this far today. Patient with difficulty recalling in detail activities, however able to provide descriptions. Patient left in chair with alarm set and call bell in reach. Continue POC.  Pain Pain Assessment Pain Scale: 0-10 Pain Score: 0-No pain  Therapy/Group: Individual Therapy  Liel Rudden M.A., CF-SLP 04/19/2023, 11:52 AM

## 2023-04-19 NOTE — Progress Notes (Signed)
Occupational Therapy Note  Patient Details  Name: Samuel Mata MRN: [id number] Date of Birth: [birth year]  [name] schedule changed to 15/7 following team discussion to optimize therapeutic outcomes and participation in therapy sessions.   Tollie Pizza Woodson 04/19/2023, 7:55 AM

## 2023-04-19 NOTE — Progress Notes (Signed)
PROGRESS NOTE   Subjective/Complaints:  Spoke with daughter about team conf, family has some ability to provide 24/7 sup   ROS-denies fever, chills, chest pain, shortness of breath, abdominal pain, nausea, vomiting  Objective:   No results found. Recent Labs    04/18/23 0623  WBC 9.3  HGB 14.8  HCT 44.8  PLT 505*    Recent Labs    04/18/23 0623  NA 136  K 4.4  CL 100  CO2 29  GLUCOSE 96  BUN 18  CREATININE 0.87  CALCIUM 9.6     Intake/Output Summary (Last 24 hours) at 04/19/2023 0914 Last data filed at 04/19/2023 0756 Gross per 24 hour  Intake 498 ml  Output 325 ml  Net 173 ml        Physical Exam: Vital Signs Blood pressure 132/75, pulse (!) 59, temperature 98 F (36.7 C), temperature source Oral, resp. rate 18, height 5\' 7"  (1.702 m), weight 58.8 kg, SpO2 99%.   General: No acute distress Mood and affect are appropriate Heart: Regular rate and rhythm no rubs murmurs or extra sounds Lungs: Clear to auscultation, breathing unlabored, no rales or wheezes Abdomen: Positive bowel sounds, soft nontender to palpation, nondistended Extremities: No clubbing, cyanosis, or edema  Neurologic: Cranial nerves II through XII intact-hard of hearing, motor strength is 5/5 in bilateral deltoid, bicep, tricep, grip, hip flexor, knee extensors, ankle dorsiflexor and plantar flexor + dysdiadochokinesis with sup/pron LUE  LT sensation intact BUE  Musculoskeletal: Full range of motion in all 4 extremities. No joint swelling   Assessment/Plan: 1. Functional deficits which require 3+ hours per day of interdisciplinary therapy in a comprehensive inpatient rehab setting. Physiatrist is providing close team supervision and 24 hour management of active medical problems listed below. Physiatrist and rehab team continue to assess barriers to discharge/monitor patient progress toward functional and medical goals  Care  Tool:  Bathing    Body parts bathed by patient: Right arm, Left arm, Chest, Face, Front perineal area, Abdomen, Right upper leg, Left upper leg, Right lower leg, Left lower leg   Body parts bathed by helper: Buttocks     Bathing assist Assist Level: Minimal Assistance - Patient > 75%     Upper Body Dressing/Undressing Upper body dressing   What is the patient wearing?: Pull over shirt    Upper body assist Assist Level: Minimal Assistance - Patient > 75%    Lower Body Dressing/Undressing Lower body dressing      What is the patient wearing?: Pants, Underwear/pull up     Lower body assist Assist for lower body dressing: Moderate Assistance - Patient 50 - 74%     Toileting Toileting    Toileting assist Assist for toileting: Minimal Assistance - Patient > 75%     Transfers Chair/bed transfer  Transfers assist     Chair/bed transfer assist level: Minimal Assistance - Patient > 75% Chair/bed transfer assistive device: Armrests, Geologist, engineering   Ambulation assist      Assist level: Moderate Assistance - Patient 50 - 74% Assistive device: Walker-rolling Max distance: 167ft   Walk 10 feet activity   Assist     Assist level:  Minimal Assistance - Patient > 75% Assistive device: Walker-rolling   Walk 50 feet activity   Assist    Assist level: Moderate Assistance - Patient - 50 - 74% Assistive device: Walker-rolling    Walk 150 feet activity   Assist Walk 150 feet activity did not occur: Safety/medical concerns         Walk 10 feet on uneven surface  activity   Assist Walk 10 feet on uneven surfaces activity did not occur: Safety/medical concerns         Wheelchair     Assist Is the patient using a wheelchair?: Yes Type of Wheelchair: Manual    Wheelchair assist level: Dependent - Patient 0%      Wheelchair 50 feet with 2 turns activity    Assist        Assist Level: Dependent - Patient 0%    Wheelchair 150 feet activity     Assist      Assist Level: Dependent - Patient 0%   Blood pressure 132/75, pulse (!) 59, temperature 98 F (36.7 C), temperature source Oral, resp. rate 18, height 5\' 7"  (1.702 m), weight 58.8 kg, SpO2 99%.  Medical Problem List and Plan: 1. 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 If pt is tolerating full CIR schedule may need 15/7 2.  Antithrombotics: -DVT/anticoagulation:  Pharmaceutical: Lovenox             -antiplatelet therapy: DAPT x 3 weeks followed by Plavix alone.  3. Pain Management: tylenol prn.  4. Mood/Behavior/Sleep: LCSW to follow for evaluation and support.              -antipsychotic agents: N/A Somnolent states he did not sleep well check sleep graph , add melatonin, no sedating meds noted,  5. Neuropsych/cognition: This patient is? capable of making decisions on his own behalf, however is very HOH and maybe sundowning 6. Skin/Wound Care: Routine pressure relief measures.  7. Fluids/Electrolytes/Nutrition: Monitor I/O. BMET looks normal     Latest Ref Rng & Units 04/18/2023    6:23 AM 04/14/2023    5:00 AM 04/13/2023    5:14 AM  BMP  Glucose 70 - 99 mg/dL 96  95  98   BUN 8 - 23 mg/dL 18  13  12    Creatinine 0.61 - 1.24 mg/dL 1.61  0.96  0.45   Sodium 135 - 145 mmol/L 136  132  134   Potassium 3.5 - 5.1 mmol/L 4.4  3.8  3.9   Chloride 98 - 111 mmol/L 100  101  103   CO2 22 - 32 mmol/L 29  27  25    Calcium 8.9 - 10.3 mg/dL 9.6  8.7  9.0     8. HTN: Monitor BP TID--metoprolol resumed on 11/26.      Vitals:   04/18/23 1921 04/19/23 0336  BP: 127/79 132/75  Pulse: 75 (!) 59  Resp: 18 18  Temp: 97.6 F (36.4 C) 98 F (36.7 C)  SpO2: 98% 99%   Some lability , on lower dose metoprolol 12.5 mg BID and amlodipine 5mg  daily 9. 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 Dyslipidemia: LDL-98-->now on Lipitor 80 mg daily 12. Non cardiac chest pain: Was started on metoprolol with prn NTG 11/15 by Dr. Melburn Popper.   13. GERD: Continue Pepcid. 14. 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  15. OSA?: Sleep study recommended at discharge.  16. Sundowning?  11/29- will try Seroquel 12.5 mg at bedtime prn for agitation/restlessness/lack of sleep  12/1 patient received Seroquel last night.   Add melatonin 12/2 17. R dry eye/mild conjunctivitis  -Lubricating eyedrops started-medication was scheduled    LOS: 7 days A FACE TO FACE EVALUATION WAS PERFORMED  Erick Colace 04/19/2023, 9:14 AM

## 2023-04-19 NOTE — Progress Notes (Signed)
Physical Therapy Session Note  Patient Details  Name: Samuel Mata MRN: [id number] Date of Birth: [birth year]  Today's Date: 04/19/2023 PT Individual Time: [phone] and [id number] PT Individual Time Calculation (min): 57 min and 49 min  Short Term Goals: Week 1:  PT Short Term Goal 1 (Week 1): Pt will perform supine<>sit with min A PT Short Term Goal 2 (Week 1): Pt will perform sit<>stands using LRAD with min A PT Short Term Goal 3 (Week 1): Pt will perform bed<>chair transfer using LRAD with min A PT Short Term Goal 4 (Week 1): Pt will ambulate at least 73ft using LRAD with min A  Skilled Therapeutic Interventions/Progress Updates:    Session 1: Pt received sitting in recliner with nurse present and pt agreeable to therapy session. Sit>stand recliner>RW requiring x2 attempts with reinforcement of previous cuing to scoot forward, place feet back underneath BOS, and lean forward - upon initial attempt to stand pt with posterior lean and pushing backs of legs into seat - light min A for balance. Gait training ~76ft to sink using RW with CGA/light min A for balance and pt continuing to have L lean with L knee flexed during gait and poor L LE foot clearance. Standing at sink with CGA/light min A for steadying/balance due to L lean while performing oral care - pt with increasing awareness of L lean and starting to self-cue to activate L quads to return to upright. When turning and stepping back to w/c using RW pt requires mod A for balance due to stronger L posterior lean.    Transported to/from gym in w/c for time management and energy conservation.  Standing balance focused on midline orientation and L LE NMR task of repeated L foot taps to 4" step using mirror feedback - requires varying min to mod A for balance due to L posterior LOB - had to slow down and focus on reorienting to midline between each foot taps. Improved slightly on 2nd set.  Gait training ~173ft using RW with progressively  heavier min assist for balance due to L lean. Pt demonstrating the following gait deviations with therapist providing the described cuing and facilitation for improvement:  - cuing to stay inside of RW, especially when turning (pt's L foot tends to get outside of the RW)  - cuing to stand up tall due to crouching down on L LE causing worsening L lean - increased L step length  - increased gait speed  - L attention to make all L turns through doorways   Continues to require heavy min/light mod A for pivoting hips fully when going to sit due to sitting prematurely and not lining up with seat correctly.  Transported back to room in w/c. Short distance gait ~49ft from w/c>recliner using RW with light min A for balance, heavier min A for balance when turning to sit. Pt left seated in recliner with needs in reach, seat belt alarm on, and daughter Samuel Mata) present.    Session 2: Pt received supine in bed having just awoken from a nap with his daughter, Samuel Mata, present and pt agreeable to therapy session. When initially awakening pt seemed to be having difficulty fully orienting himself to location and situation, being slightly irritable towards therapist, but this improved and dissipated throughout session as pt awoke more fully and realized his familiarity with the situation and therapist. Supine>sitting L EOB, HOB slightly elevated, with CGA for steadying - donned shoes total A.   Sit>stand EOB>RW with continued cuing to scoot  forward, bring feet back underneath his BOS, and push up from seat with CGA for steadying.   Gait training ~51ft x2 in/out bathroom using RW with CGA/light min A for steadying with pt demoing improving midline orientation, improved L LE foot clearance and step length - cuing to maintain inside of RW especially when turning. Standing with CGA for steadying balance during min A LB clothing management - pt wanting privacy with toileting so therapist cracked door - continent of bowel  and bladder, performed seated peri-care without assist.   Standing at sink during hand hygiene pt continues to have gradual worsening of L lateral lean with L knee crouching into flexion with fatigue - repeated cuing to activate L quads with pt having decreased correction of this compared to this AM, likely due to pt still waking up fully at this time. When stepping back to sit in w/c pt continues to require heavy min A for balance due to stronger L lean when stepping back and decreased midline orientation with seat.   Transported to/from gym in w/c for time management and energy conservation.  Gait training ~168ft using RW with CGA/light min assist for balance with improving midline orientation this afternoon and improving L LE foot clearance and step length - continues to require cuing to maintain proximity to RW when turning and occasionally will kick back L leg of RW due to his alignment with RW being slightly off-set.   Gait training 230ft using +2 B HHA providing light min A with focus on increased intensity of walking for increased gait speed, increased speed of L LE advancement, improved L LE foot clearance, and improved weight shifting - with fatigue towards end, pt demos decreased L LE foot clearance and decreased R weight shift during R stance but overall improving.   Therapist educated pt's DTR on his CLOF, impaired midline orientation with L posterior lean bias, LTGs, and plan for team conference tomorrow to discuss his ELOS. She stated that pt will only have supervision support at home with his wife as she cannot provide any physical assistance.  Gait training ~246ft back to his room using RW with CGA/light min A progressed to consistent min A with fatigue - continues to demo above gait deviations. Pt left seated in recliner with needs in reach, seat belt alarm on, and his daughter present.   Therapy Documentation Precautions:  Precautions Precautions: Fall, Other  (comment) Precaution Comments: HOH (hears better in R ear), sundowning, L inattention, L vs posterior lean Restrictions Weight Bearing Restrictions: No   Pain:  Session 1: No reports of pain throughout session.  Session 2: No reports of pain throughout session.   Therapy/Group: Individual Therapy  Ginny Forth , PT, DPT, NCS, CSRS 04/19/2023, 7:51 AM

## 2023-04-20 DIAGNOSIS — I10 Essential (primary) hypertension: Secondary | ICD-10-CM | POA: Diagnosis not present

## 2023-04-20 DIAGNOSIS — F05 Delirium due to known physiological condition: Secondary | ICD-10-CM | POA: Diagnosis not present

## 2023-04-20 DIAGNOSIS — I6381 Other cerebral infarction due to occlusion or stenosis of small artery: Secondary | ICD-10-CM | POA: Diagnosis not present

## 2023-04-20 NOTE — Discharge Instructions (Addendum)
Inpatient Rehab Discharge Instructions  Samuel Mata Discharge date and time:  04/29/2023  Activities/Precautions/ Functional Status: Activity: no lifting, driving, or strenuous exercise till cleared by MD Diet: low fat, low cholesterol diet Wound Care: keep wound clean and dry  Functional status:  ___ No restrictions     ___ Walk up steps independently _x__ 24/7 supervision/assistance   ___ Walk up steps with assistance ___ Intermittent supervision/assistance  ___ Bathe/dress independently ___ Walk with walker     ___ Bathe/dress with assistance ___ Walk Independently    ___ Shower independently ___ Walk with assistance    _x__ Shower with assistance __x_ No alcohol     ___ Return to work/school ________  Special Instructions: No driving, alcohol consumption or tobacco use.No driving, alcohol consumption or tobacco use.  Recommend daily BP and pulse (heart rate) measurement in same arm and record time of day.  Hold if systolic blood pressure (top number) is less than 110 or heart rate is less than 65. Bring this information with you to follow-up appointment with PCP.  COMMUNITY REFERRALS UPON DISCHARGE:    Home Health:   PT     OT       SNA                 Agency: [facility]  [phone]   Medical Equipment/Items Ordered: Scientist, water quality                                                 Agency/Supplier: [employer] [phone]   STROKE/TIA DISCHARGE INSTRUCTIONS SMOKING Cigarette smoking nearly doubles your risk of having a stroke & is the single most alterable risk factor  If you smoke or have smoked in the last 12 months, you are advised to quit smoking for your health. Most of the excess cardiovascular risk related to smoking disappears within a year of stopping. Ask you doctor about anti-smoking medications [facility] Quit Line: 1-800-QUIT NOW Free Smoking Cessation Classes [phone]  CHOLESTEROL Know your levels; limit fat & cholesterol in your diet   Lipid Panel     Component Value Date/Time   CHOL 153 04/08/2023 0924   TRIG 31 04/08/2023 0924   HDL 49 04/08/2023 0924   CHOLHDL 3.1 04/08/2023 0924   VLDL 6 04/08/2023 0924   LDLCALC 98 04/08/2023 0924     Many patients benefit from treatment even if their cholesterol is at goal. Goal: Total Cholesterol (CHOL) less than 160 Goal:  Triglycerides (TRIG) less than 150 Goal:  HDL greater than 40 Goal:  LDL (LDLCALC) less than 100   BLOOD PRESSURE American Stroke Association blood pressure target is less that 120/80 mm/Hg  Your discharge blood pressure is:  BP: 125/71 Monitor your blood pressure Limit your salt and alcohol intake Many individuals will require more than one medication for high blood pressure  DIABETES (A1c is a blood sugar average for last 3 months) Goal HGBA1c is under 7% (HBGA1c is blood sugar average for last 3 months)  Diabetes: No known diagnosis of diabetes    Lab Results  Component Value Date   HGBA1C 5.4 04/08/2023    Your HGBA1c can be lowered with medications, healthy diet, and exercise. Check your blood sugar as directed by your physician Call your physician if you experience unexplained or low blood sugars.  PHYSICAL ACTIVITY/REHABILITATION Goal is 30  minutes at least 4 days per week  Activity: No driving, Therapies: see above Return to work: N/a Activity decreases your risk of heart attack and stroke and makes your heart stronger.  It helps control your weight and blood pressure; helps you relax and can improve your mood. Participate in a regular exercise program. Talk with your doctor about the best form of exercise for you (dancing, walking, swimming, cycling).  DIET/WEIGHT Goal is to maintain a healthy weight  Your discharge diet is:  Diet Order             Diet Heart Room service appropriate? Yes; Fluid consistency: Thin  Diet effective now                   liquids Your height is:  Height: 5\' 7"  (170.2 cm) Your current weight is:  126 lbs Your Body Mass Index (BMI) is:  BMI (Calculated): 19.3 Following the type of diet specifically designed for you will help prevent another stroke. You are at goal weight     Your goal Body Mass Index (BMI) is 19-24. Healthy food habits can help reduce 3 risk factors for stroke:  High cholesterol, hypertension, and excess weight.  RESOURCES Stroke/Support Group:  Call [phone]   STROKE EDUCATION PROVIDED/REVIEWED AND GIVEN TO PATIENT Stroke warning signs and symptoms How to activate emergency medical system (call 911). Medications prescribed at discharge. Need for follow-up after discharge. Personal risk factors for stroke. Pneumonia vaccine given:  Flu vaccine given:  My questions have been answered, the writing is legible, and I understand these instructions.  I will adhere to these goals & educational materials that have been provided to me after my discharge from the hospital.     My questions have been answered and I understand these instructions. I will adhere to these goals and the provided educational materials after my discharge from the hospital.  Patient/Caregiver Signature _______________________________ Date __________  Clinician Signature _______________________________________ Date __________  Please bring this form and your medication list with you to all your follow-up doctor's appointments.

## 2023-04-20 NOTE — Progress Notes (Signed)
Patient ID: Samuel Mata, male   DOB: 08-Jun-1930, 89 y.o.   MRN: [id number]  Team Conference Report to Patient/Family  Team Conference discussion was reviewed with the patient and caregiver, including goals, any changes in plan of care and target discharge date.  Patient and caregiver express understanding and are in agreement.  The patient has a target discharge date of 04/29/23.  Sw met with patient, daughter and daughter via telephone conference to discuss team conference updates. Daughter requesting for patient to have his ears cleaned, SW will inform pt's nurse.   SW will provide pt's daughter with [facility] resources to support pt and spouse at home.  Daughter would like clarification from speech therapist if they prefer patient to use straws vs. Not. Daughter also would like to ensure patient is not pocketing food.   Patient and daughters prefer [facility] for follow up. No additional questions or concerns currently.   [name] 04/20/2023, 2:41 PM

## 2023-04-20 NOTE — Progress Notes (Signed)
PROGRESS NOTE   Subjective/Complaints:  No issues overnite , pt states he slept well   ROS-denies fever, chills, chest pain, shortness of breath, abdominal pain, nausea, vomiting  Objective:   No results found. Recent Labs    04/18/23 0623  WBC 9.3  HGB 14.8  HCT 44.8  PLT 505*    Recent Labs    04/18/23 0623  NA 136  K 4.4  CL 100  CO2 29  GLUCOSE 96  BUN 18  CREATININE 0.87  CALCIUM 9.6     Intake/Output Summary (Last 24 hours) at 04/20/2023 0847 Last data filed at 04/19/2023 1814 Gross per 24 hour  Intake 480 ml  Output --  Net 480 ml        Physical Exam: Vital Signs Blood pressure 118/69, pulse 66, temperature 97.9 F (36.6 C), resp. rate 17, height 5\' 7"  (1.702 m), weight 57.3 kg, SpO2 94%.   General: No acute distress Mood and affect are appropriate Heart: Regular rate and rhythm no rubs murmurs or extra sounds Lungs: Clear to auscultation, breathing unlabored, no rales or wheezes Abdomen: Positive bowel sounds, soft nontender to palpation, nondistended Extremities: No clubbing, cyanosis, or edema  Neurologic: Cranial nerves II through XII intact-hard of hearing, motor strength is 5/5 in bilateral deltoid, bicep, tricep, grip, hip flexor, knee extensors, ankle dorsiflexor and plantar flexor + dysdiadochokinesis with sup/pron LUE  LT sensation intact BUE  Visual fields intact to confrontation testing Musculoskeletal: Full range of motion in all 4 extremities. No joint swelling   Assessment/Plan: 1. Functional deficits which require 3+ hours per day of interdisciplinary therapy in a comprehensive inpatient rehab setting. Physiatrist is providing close team supervision and 24 hour management of active medical problems listed below. Physiatrist and rehab team continue to assess barriers to discharge/monitor patient progress toward functional and medical goals  Care Tool:  Bathing    Body  parts bathed by patient: Right arm, Left arm, Chest, Face, Front perineal area, Abdomen, Right upper leg, Left upper leg, Right lower leg, Left lower leg   Body parts bathed by helper: Buttocks     Bathing assist Assist Level: Minimal Assistance - Patient > 75%     Upper Body Dressing/Undressing Upper body dressing   What is the patient wearing?: Pull over shirt    Upper body assist Assist Level: Minimal Assistance - Patient > 75%    Lower Body Dressing/Undressing Lower body dressing      What is the patient wearing?: Pants, Underwear/pull up     Lower body assist Assist for lower body dressing: Moderate Assistance - Patient 50 - 74%     Toileting Toileting    Toileting assist Assist for toileting: Minimal Assistance - Patient > 75%     Transfers Chair/bed transfer  Transfers assist     Chair/bed transfer assist level: Minimal Assistance - Patient > 75% Chair/bed transfer assistive device: Armrests, Geologist, engineering   Ambulation assist      Assist level: Minimal Assistance - Patient > 75% Assistive device: Walker-rolling Max distance: 234ft   Walk 10 feet activity   Assist     Assist level: Minimal Assistance - Patient >  75% Assistive device: Walker-rolling   Walk 50 feet activity   Assist    Assist level: Minimal Assistance - Patient > 75% Assistive device: Walker-rolling    Walk 150 feet activity   Assist Walk 150 feet activity did not occur: Safety/medical concerns  Assist level: Minimal Assistance - Patient > 75% Assistive device: Walker-rolling    Walk 10 feet on uneven surface  activity   Assist Walk 10 feet on uneven surfaces activity did not occur: Safety/medical concerns         Wheelchair     Assist Is the patient using a wheelchair?: Yes Type of Wheelchair: Manual    Wheelchair assist level: Dependent - Patient 0%      Wheelchair 50 feet with 2 turns activity    Assist        Assist  Level: Dependent - Patient 0%   Wheelchair 150 feet activity     Assist      Assist Level: Dependent - Patient 0%   Blood pressure 118/69, pulse 66, temperature 97.9 F (36.6 C), resp. rate 17, height 5\' 7"  (1.702 m), weight 57.3 kg, SpO2 94%.  Medical Problem List and Plan: 1. 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 Formulized individual treatment plan based on medical history, underlying problem and comorbidities.              Con't CIR PT, OT and SLP  15/7 schedule will discuss with team whether this cn be advance to regular schedule 2.  Antithrombotics: -DVT/anticoagulation:  Pharmaceutical: Lovenox             -antiplatelet therapy: DAPT x 3 weeks followed by Plavix alone.  3. Pain Management: tylenol prn.  4. Mood/Behavior/Sleep: LCSW to follow for evaluation and support.              -antipsychotic agents: N/A Somnolent states he did not sleep well check sleep graph , add melatonin, no sedating meds noted,  5. Neuropsych/cognition: This patient is? capable of making decisions on his own behalf, however is very HOH and maybe sundowning 6. Skin/Wound Care: Routine pressure relief measures.  7. Fluids/Electrolytes/Nutrition: Monitor I/O. BMET looks normal     Latest Ref Rng & Units 04/18/2023    6:23 AM 04/14/2023    5:00 AM 04/13/2023    5:14 AM  BMP  Glucose 70 - 99 mg/dL 96  95  98   BUN 8 - 23 mg/dL 18  13  12    Creatinine 0.61 - 1.24 mg/dL 9.52  8.41  3.24   Sodium 135 - 145 mmol/L 136  132  134   Potassium 3.5 - 5.1 mmol/L 4.4  3.8  3.9   Chloride 98 - 111 mmol/L 100  101  103   CO2 22 - 32 mmol/L 29  27  25    Calcium 8.9 - 10.3 mg/dL 9.6  8.7  9.0     8. HTN: Monitor BP TID--metoprolol resumed on 11/26.       Vitals:   04/19/23 1928 04/20/23 0354  BP: 124/64 118/69  Pulse: 80 66  Resp: 17 17  Temp: 98 F (36.7 C) 97.9 F (36.6 C)  SpO2: 95% 94%  12/4 controlled  on lower dose metoprolol 12.5 mg BID and amlodipine 5mg  daily 9. 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 Dyslipidemia: LDL-98-->now on Lipitor 80 mg daily 12. Non cardiac chest pain: Was started on metoprolol with prn NTG 11/15 by Dr. Melburn Popper.   13. GERD: Continue Pepcid. 14. 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  15. OSA?: Sleep study recommended at discharge.  16. Sundowning?  11/29- will try Seroquel 12.5 mg at bedtime prn for agitation/restlessness/lack of sleep  12/1 patient received Seroquel last night.   Add melatonin 12/2- sundowning improving, no Seroquel has been needed 17. R dry eye/mild conjunctivitis  -Lubricating eyedrops started-medication was scheduled    LOS: 8 days A FACE TO FACE EVALUATION WAS PERFORMED  Erick Colace 04/20/2023, 8:47 AM

## 2023-04-20 NOTE — Progress Notes (Signed)
Physical Therapy Weekly Progress Note  Patient Details  Name: Samuel Mata MRN: [id number] Date of Birth: 1930/06/22  Beginning of progress report period: April 13, 2023 End of progress report period: April 20, 2023  Today's Date: 04/20/2023 PT Individual Time: 6962-9528 and 4132-4401 PT Individual Time Calculation (min): 45 min and 46 min  Patient has met 4 of 4 short term goals. Samuel Mata is progressing well with therapy demonstrating increasing independence with functional mobility. Samuel Mata greatest deficits are impaired midline orientation with L posterior lean, impaired balance recovery strategies, L hemiparesis, and impaired cognition with decreased recall of learning. He is performing supine<>sit with CGA, sit<>stands and stand pivot transfers ranging from CGA to mod A with fatigue causing worsening L posterior lean, and ambulating up to ~184ft using RW with min A. He will benefit from continued CIR level skilled physical therapy prior to D/Cing home with 24hr support from family. He may need to reach supervision level prior to D/C home based on family's ability to provide support.  Patient continues to demonstrate the following deficits muscle weakness, decreased cardiorespiratoy endurance, impaired timing and sequencing, abnormal tone, unbalanced muscle activation, and decreased motor planning, decreased midline orientation and decreased attention to left, decreased attention, decreased awareness, decreased problem solving, decreased safety awareness, decreased memory, and delayed processing, and decreased sitting balance, decreased standing balance, decreased postural control, hemiplegia, and decreased balance strategies and therefore will continue to benefit from skilled PT intervention to increase functional independence with mobility.  Patient progressing toward long term goals..  Continue plan of care.  PT Short Term Goals Week 1:  PT Short Term Goal 1 (Week 1): Pt will  perform supine<>sit with min A PT Short Term Goal 1 - Progress (Week 1): Met PT Short Term Goal 2 (Week 1): Pt will perform sit<>stands using LRAD with min A PT Short Term Goal 2 - Progress (Week 1): Met PT Short Term Goal 3 (Week 1): Pt will perform bed<>chair transfer using LRAD with min A PT Short Term Goal 3 - Progress (Week 1): Met PT Short Term Goal 4 (Week 1): Pt will ambulate at least 65ft using LRAD with min A PT Short Term Goal 4 - Progress (Week 1): Met Week 2:  PT Short Term Goal 1 (Week 2): = to LTGs based on ELOS  Skilled Therapeutic Interventions/Progress Updates:  Ambulation/gait training;Cognitive remediation/compensation;Discharge planning;DME/adaptive equipment instruction;Functional mobility training;Pain management;Psychosocial support;Therapeutic Activities;UE/LE Strength taining/ROM;Visual/perceptual remediation/compensation;Balance/vestibular training;Community reintegration;Disease management/prevention;Neuromuscular re-education;Functional electrical stimulation;Patient/family education;Skin care/wound Engineer, manufacturing systems;Therapeutic Exercise;UE/LE Coordination activities   Session 1: Pt received supported in bed with nurse present for morning medication. Nurse and pt reporting he woke up with a "crick" in his neck on the R side - nurse has already supplied pt with heat pack for pain management. With encouragement and increased time, pt agreeable to transition to EOB despite neck pain. Supine>sitting L EOB, HOB partially elevated and using bedrail, with CGA. Noticed pt had not yet eaten his breakfast. Sitting EOB with distant supervision for sitting balance (which is significantly improved), pt able to utilize B UEs (while maintaining sitting balance) to participate in eating breakfast with set-up assist to open fruit cup. Pt reports he is eager to hear update on team conference today.   Sit>stands to RW vs on AD with repeated cuing for scooting hips forward, bringing  feet back underneath his BOS, and pushing up with both hands from seat while leaning forward when rising - CGA with occasional light min A for steadying upon initially standing  until gaining balance.  Gait training ~276ft using RW with CGA progressed to light min assist for balance. Pt demonstrating the following gait deviations with therapist providing the described cuing and facilitation for improvement:  - improving upright, midline posture - improving R weight shift onto R stance phase allowing increased L LE foot clearance and step length for reciprocal pattern - increasing gait speed - continues to have excessive forward trunk flexed posture and tendency to keep AD too far forward, requiring increased cuing especially when turning for safety, but improving  Gait training ~69ft using L HHA back to room with min A for balance and cuing for upright posture and pt demos significant improvement in this compared to at initial evaluation - continues to achieve reciprocal stepping pattern with decreased L posterior lean.   Standing oral care at sink, no AD, but relying heavily on hip support against counter with CGA for safety and min cuing to improve midline orientation due to continued L lean bias with prolonged standing.   Gait training to bathroom using L HHA with pt wanting to reach for more support on objects when navigating in this environment with turns requiring heavier min A for balance. Pt left in care of NT.  Session 2: Pt received sitting in w/c, awake and agreeable to therapy session. Therapist reinforced education with pt on recommendation to use RW at home, but participating in gait training without it for balance challenge - pt verbalized understanding. Sit>stand w/c> L HHA with light min A - pt demos good recall to scoot forward and push up from seat, but cuing to bring feet back underneath BOS and lean forward while rising. Gait training ~2ft x2 on/out bathroom using L HHA and  furniture walking with min A for balance and cuing for upright posture, increased L LE foot clearance and step length. Standing with CGA/light min A during mod A LB clothing management. Continent of bladder. Standing at sink without support on counter completed hand hygiene with CGA for steadying balance and min cuing to maintain upright posture due to gradual forward trunk flexion and L knee crouching.  Transported to/from gym in w/c for time management and energy conservation.   Dynamic standing balance, no UE support, and L LE NMR task using mirror feedback for improved midline orientation:  - L LE foot taps to 4" step x10reps - pt with significant improvement in ability to weight shift to tap L foot up, but has greatest difficulty keeping weight shifted R to bring his L foot all the way back so repeatedly has L posterior LOB requiring min A to recover - improving some with continued cuing and repetition - L LE forward/backwards stepping over walking stick x8 reps, still has greatest deficit with attempt to step backwards due to L posterior LOB from lack of adequate step length and sustained R weight shift, but slowly improving with repetition - min A for balance  Gait training ~275ft using L HHA with min assist primarily through [facility] for balance - continued cuing throughout to maintain upright posture, increased gait speed, increased L LE foot clearance and step length, all with the goal of increasing intensity of the exercise to promote motor learning.  Transported back to room. Sit>stand w/c>RW with pt continuing to require cuing to push up with both hands from armrest and lean forward, continues to use backs of legs against seat for stability. Gait training ~63ft in room w/c>recliner using RW with CGA for safety. Pt left seated in recliner with  needs in reach and seat belt alarm on.  Therapy Documentation Precautions:  Precautions Precautions: Fall, Other (comment) Precaution Comments: HOH (hears  better in R ear), sundowning, L inattention, L vs posterior lean Restrictions Weight Bearing Restrictions: No   Pain:  Session 1: Pain in R neck upon waking this morning - reports it is a "crick" in his neck - reports it feels better throughout session.  Session 2: No reports of pain throughout session.    Therapy/Group: Individual Therapy  Ginny Forth , PT, DPT, NCS, CSRS 04/20/2023, 7:45 AM

## 2023-04-20 NOTE — Progress Notes (Signed)
Speech Language Pathology Weekly Progress and Session Note  Patient Details  Name: Samuel Mata MRN: [id number] Date of Birth: [birth year]  Beginning of progress report period: April 13, 2023 End of progress report period: April 20, 2023  Today's Date: 04/20/2023 SLP Individual Time: 1345-1415 SLP Individual Time Calculation (min): 30 min  Short Term Goals: Week 1: SLP Short Term Goal 1 (Week 1): Patient will recall and utilize memory compensatory strategies given min multimodal A SLP Short Term Goal 1 - Progress (Week 1): Progressing toward goal SLP Short Term Goal 2 (Week 1): Patient will increase processing speed during functional tasks given min multimodal A SLP Short Term Goal 2 - Progress (Week 1): Met SLP Short Term Goal 3 (Week 1): Pt will demosntrate awareness of any pocketing of solids in L cheek pocket and clear residue with min cues as needed. SLP Short Term Goal 3 - Progress (Week 1): Met SLP Short Term Goal 4 (Week 1): Patient will demonstrate problem solving in mildly complex situations given min multimodal A SLP Short Term Goal 4 - Progress (Week 1): Progressing toward goal    New Short Term Goals: Week 2: SLP Short Term Goal 1 (Week 2): STG = LTG due to ELOS  Weekly Progress Updates: Pt has made good gains and has met 2 of 4 STGs this reporting period due to improved dysphagia and cognition. Currently, patient continues to require at least mod A for use/recall of memory strategies and minA for use of dysphagia strategies, completing basic problem solving tasks and increasing processing speed.  POC updated to reflect current progress. Pt/family eduction ongoing. Pt would benefit from continued ST intervention to maximize dysphagia and cognition in order to maximize functional independence at d/c.   Intensity: Minumum of 1-2 x/day, 30 to 90 minutes Frequency: 1 to 3 out of 7 days Duration/Length of Stay: 12/13 Treatment/Interventions: Cognitive  remediation/compensation;Internal/external aids;Therapeutic Activities;Functional tasks;Patient/family education;Therapeutic Exercise;Dysphagia/aspiration precaution training   Daily Session  Skilled Therapeutic Interventions:  Skilled therapy session focused on cognitive goals. SLP facilitated session by providing minA during identification of medication mistakes activity. Patient initially fatigued, though once aroused, required minA to complete tasks. Patient benefited from color variation of pills this date, though continues to ask questions regarding use of pill box. Plan to complete medication management activity with real pill box next session. Patient left in [facility] with alarm set and call bell in reach/ Continue POC.      Pain:  None reported   Therapy/Group: Individual Therapy  Jalani Rominger M.A., CF-SLP 04/20/2023, 2:25 PM

## 2023-04-20 NOTE — Progress Notes (Signed)
Occupational Therapy Weekly Progress Note  Patient Details  Name: Samuel Mata MRN: [id number] Date of Birth: 09-20-1930  Beginning of progress report period: April 13, 2023 End of progress report period: April 20, 2023  Today's Date: 04/20/2023 OT Individual Time: 1103-1200 OT Individual Time Calculation (min): 57 min    Patient has met 4 of 4 short term goals. Samuel Mata is steadily progressing towards reaching his LTGs. He is completing U/LB bathing with min A when seated on TTB, UB dressing CGA, LB dressing min A, toileting min A, and grooming/hygiene tasks supervision. He is completing ambulatory transfers to toile using RW with min A. He is supported by his wife and DTRs who visit and support him throughout his POC.   Patient continues to demonstrate the following deficits: muscle weakness, decreased cardiorespiratoy endurance, impaired timing and sequencing, unbalanced muscle activation, decreased coordination, and decreased motor planning, decreased attention to left, decreased initiation, decreased attention, decreased awareness, decreased problem solving, decreased safety awareness, decreased memory, and delayed processing, central origin, and decreased sitting balance, decreased standing balance, decreased postural control, and decreased balance strategies and therefore will continue to benefit from skilled OT intervention to enhance overall performance with BADL and Reduce care partner burden.  Patient progressing toward long term goals..  Continue plan of care.  OT Short Term Goals Week 1:  OT Short Term Goal 1 (Week 1): Pt will perform sit<>stands using LRAD min A consistently OT Short Term Goal 1 - Progress (Week 1): Met OT Short Term Goal 2 (Week 1): Pt will maintain standing balance during BADLs min A OT Short Term Goal 2 - Progress (Week 1): Met OT Short Term Goal 3 (Week 1): Pt will maintain sitting balance during BADLs CGA to decrease overall bourdon of care OT  Short Term Goal 3 - Progress (Week 1): Met OT Short Term Goal 4 (Week 1): Pt will complete LB dressing min A using LRAD OT Short Term Goal 4 - Progress (Week 1): Met Week 2:  OT Short Term Goal 1 (Week 2): STG=LTG d/t ELOS  Skilled Therapeutic Interventions/Progress Updates:     Pt received sitting up in recliner at beginning of session. Pt presenting to be in good spirits receptive to skilled OT session reporting mild pain in neck d/t sleeping in odd position last night- OT offering intermittent rest breaks, repositioning, and therapeutic support to optimize participation in therapy session. Focus this session functional mobility training, dynamic sitting/standing balance, L side attention, and activity tolerance.   Engaged Pt in completing functional mobility ~20 ft from recliner to wc using RW for functional mobility and endurance training. Pt with improved recall of technique of sit<>stands and hand placement with Pt able to correctly position hips at edge of recliner and position B UEs on recliner arm rests with min question cues. Pt required min A overall for balance and RW management with improved problem solving and safety awareness noted during this trial.   Transported Pt total A to therapy gym in wc for energy conservation and time management.   Pt completed short distance functional mobility ~15 ft from wc > EOM using RW with light min A required for balance and RW management and min verbal cues for L foot placement. Pt able to recall hand placement prior to sit>stand without verbal cues!!  Engaged Pt in dynamic sitting balance activities with inferior reaching and posterior reaching incorporated into tasks to simulate skills required for BADLs. Pt instructed to reach posteriorly to retrieve squigz from behind his  back using L UE and then place on the bottom of large vertical mirror with Pt requiring CGA for sitting balance and min verbal cues for safety to ensure hips and feet maintain in  safe position. Pt then completed seated step overs over cone to work on lifting B LEs for increased independence when weaving B LEs into pants during LB dressing. Pt completed 2x10 reps of step overs R/L with mod verbal and tactile cues for foot clearance height on L LE.   Pt completed dynamic standing balance step taps to cone with vertical mirror placed anterior to Pt for improved visual feedback to increase midline orientation and body awareness. Pt able to complete 1x10 reps on R/L LE and then 1x10 reps alternating R/L while standing with B UEs supported on RW. Pt with increased L lean when standing on R LE > when standing on L LE with mod verbal and tactile cues required for midline awareness and to weight shift to R.   Engaged Pt in dynamic standing balance functional reaching activity using RW for address [name] balance deficits. Pt utilized L UE to retrieve, place, and remove squigz from large vertical mirror with CGA to light min A provided for balance.   Stand pivot EOM>wc using RW light min A for balance and RW management. Transported Pt back to room total A in wc. Pt was left resting in wc with call bell in reach, seatbelt alarm on, and all needs met.    Therapy Documentation Precautions:  Precautions Precautions: Fall, Other (comment) Precaution Comments: HOH (hears better in R ear), sundowning, L inattention, L vs posterior lean Restrictions Weight Bearing Restrictions: No  Therapy/Group: Individual Therapy  [name] 04/20/2023, 11:34 AM

## 2023-04-20 NOTE — Plan of Care (Signed)
  POC updated to reflect current progress Problem: RH Memory Goal: LTG Patient will use memory compensatory aids to (SLP) Description: LTG:  Patient will use memory compensatory aids to recall biographical/new, daily complex information with cues (SLP) Flowsheets (Taken 04/20/2023 1431) LTG: Patient will use memory compensatory aids to (SLP): Minimal Assistance - Patient > 75%

## 2023-04-20 NOTE — Patient Care Conference (Signed)
Inpatient RehabilitationTeam Conference and Plan of Care Update Date: 04/20/2023   Time: 10:41 AM    Patient Name: Samuel Mata      Medical Record Number: [id number]  Date of Birth: [birth year] Sex: Male         Room/Bed: 4W11C/4W11C-01 Payor Info: Payor: [employer] MEDICARE / Plan: Monia [employer] MEDICARE HMO/PPO / Product Type: *No Product type* /    Admit Date/Time:  04/12/2023  6:27 PM  Primary Diagnosis:  Right thalamic stroke [facility])  Hospital Problems: Principal Problem:   Right thalamic stroke [facility])    Expected Discharge Date: Expected Discharge Date: 04/29/23  Team Members Present: Physician leading conference: Dr. Claudette Laws Social Worker Present: Lavera Guise, BSW Nurse Present: Chana Bode, RN PT Present: Casimiro Needle, PT OT Present: Mariann Barter, OT SLP Present: Everardo Pacific, SLP PPS Coordinator present : Fae Pippin, SLP     Current Status/Progress Goal Weekly Team Focus  Bowel/Bladder   Pt is continent/incontinent of bowel/bladder   Pt to gain continence with q4 toileting   Will assess qshift and PRN    Swallow/Nutrition/ Hydration   reg/thin   supervision  utilization of swallowing compensatory strateiges    ADL's   UB ADLs CGA, LB ADL mod A, grooming/hygeine superivsion when seated, min to mod A stand pivot transfers to toilet and TTB in walk-in shower; barriers: decreased insight into deficts, impaired midline orientation and L side attention, L posterior lean in standing, poor awareness   supervision to CGA   began family education, BADL retraining, L side attention,    Mobility   CGA/min A supine<>sit, min/mod A sit<>stand and stand pivot transfers using RW, min/mod A gait up to 164ft using RW due to L lean and poor L LE foot clearance - continues to have impaired midline orientation with L posterior lean and poor awareness and balance recovery strategies to maintain upright   CGA overall at ambulatory level  midline orientation, standing  balance, L hemibody NMR, gait training, transfer training, pt/family education    Communication                Safety/Cognition/ Behavioral Observations  modA   supervision-minA   completion of medication management tasks, utilization of memory strategies, increasing processing time    Pain   Pt complains of right elbow pain   [name] pain controlled with medication   Will assess qshift and PRN    Skin      N/a           Discharge Planning:  Discharging home with spouse able to provide supervision only. Spouse uses a RW.  1 local daughter, 1 daughter here from [place].  1 level home, 2 steps. Pt has: shower seat. SPC and BSC.   Team Discussion: Patient post right thalamic CVA with sundowning early in admission now resolved.  Continue to note memory deficits slow initiation and problem solving deficits with left posterior leaning, left foot clearance issues and decreased insight into deficits with impaired mid line orientation.  Patient on target to meet rehab goals: yes, currently needs CGA for upper body care with mod assist for lower body care and min - mod assist for transfers.  Needs min - mod assist for sit - stand and stand pivot transfers. Able to ambulate up to 18' using a Rw with min - mod assist.  Goals for discharge set for supervision - CGA overall.  *See Care Plan and progress notes for long and short-term goals.   Revisions to Treatment Plan:  N/a   Teaching Needs: Safety, medications, diet modification recommendations, balance strategies, cognitive cues for complex cognitive tasks, transfers, etc.   Current Barriers to Discharge: Decreased caregiver support and home environment issues  Possible Resolutions to Barriers: Family education HH follow up services DME: RW     Medical Summary Current Status: sundowning improved , fatigues easily  Barriers to Discharge: Other (comments)   Possible Resolutions to [employer] Focus: have adjusted schedule  to 15/7, work on midline orientation and balance   Continued Need for Acute Rehabilitation Level of Care: The patient requires daily medical management by a physician with specialized training in physical medicine and rehabilitation for the following reasons: Direction of a multidisciplinary physical rehabilitation program to maximize functional independence : Yes Medical management of patient stability for increased activity during participation in an intensive rehabilitation regime.: Yes Analysis of laboratory values and/or radiology reports with any subsequent need for medication adjustment and/or medical intervention. : Yes   I attest that I was present, lead the team conference, and concur with the assessment and plan of the team.   Chana Bode B 04/20/2023, 2:23 PM

## 2023-04-21 DIAGNOSIS — F05 Delirium due to known physiological condition: Secondary | ICD-10-CM | POA: Diagnosis not present

## 2023-04-21 DIAGNOSIS — I10 Essential (primary) hypertension: Secondary | ICD-10-CM | POA: Diagnosis not present

## 2023-04-21 DIAGNOSIS — I6381 Other cerebral infarction due to occlusion or stenosis of small artery: Secondary | ICD-10-CM | POA: Diagnosis not present

## 2023-04-21 LAB — CBC
HCT: 39.4 % (ref 39.0–52.0)
Hemoglobin: 13.7 g/dL (ref 13.0–17.0)
MCH: 31.6 pg (ref 26.0–34.0)
MCHC: 34.8 g/dL (ref 30.0–36.0)
MCV: 90.8 fL (ref 80.0–100.0)
Platelets: 475 10*3/uL — ABNORMAL HIGH (ref 150–400)
RBC: 4.34 MIL/uL (ref 4.22–5.81)
RDW: 11.9 % (ref 11.5–15.5)
WBC: 8.5 10*3/uL (ref 4.0–10.5)
nRBC: 0 % (ref 0.0–0.2)

## 2023-04-21 LAB — BASIC METABOLIC PANEL
Anion gap: 8 (ref 5–15)
BUN: 21 mg/dL (ref 8–23)
CO2: 26 mmol/L (ref 22–32)
Calcium: 8.9 mg/dL (ref 8.9–10.3)
Chloride: 99 mmol/L (ref 98–111)
Creatinine, Ser: 0.87 mg/dL (ref 0.61–1.24)
GFR, Estimated: >60 mL/min (ref >60)
Glucose, Bld: 92 mg/dL (ref 70–99)
Potassium: 4.1 mmol/L (ref 3.5–5.1)
Sodium: 133 mmol/L — ABNORMAL LOW (ref 135–145)

## 2023-04-21 MED ORDER — CARBAMIDE PEROXIDE 6.5 % OT SOLN
5.0000 [drp] | Freq: Two times a day (BID) | OTIC | Status: AC
  Administered 2023-04-21 – 2023-04-23 (×6): 5 [drp] via OTIC
  Filled 2023-04-21: qty 15

## 2023-04-21 NOTE — Progress Notes (Signed)
Occupational Therapy Session Note  Patient Details  Name: Samuel Mata MRN: [id number] Date of Birth: April 02, 1931  Today's Date: 04/21/2023 OT Individual Time: 1119-1200 OT Individual Time Calculation (min): 41 min    Short Term Goals: Week 2:  OT Short Term Goal 1 (Week 2): STG=LTG d/t ELOS  Skilled Therapeutic Interventions/Progress Updates:     Pt received sitting up in bed sleeping upon OT arrival with increased time required to wake for OT session. Pt presenting to be in good spirits receptive to skilled OT session reporting 0/10 pain- OT offering intermittent rest breaks, repositioning, and therapeutic support to optimize participation in therapy session. Skilled therapy session with focus on BADL retraining, functional mobility training, and functional cognition. Pt requesting to shave and complete oral care this am. Engaged Pt in completing his morning routine to support improved participation and independence upon d/c home. Pt transitioned to EOB using bed features supervision +increased time. Pt able to recall correct hand placement and technique for sit > stand with min questioning cues and increased time for problem solving. Pt able to complete sit>stand using RW with light CGA provided- improved anterior weight shifting and decreased posterior lean noted this session. Pt completed functional mobility to wc using RW ~15 ft for functional mobility and endurance training with light min A balance and RW management and min verbal cues for L foot placement, RW proximity, and safety when arriving at wc. Positioned Pt at sink seated in wc for grooming/hygiene task for energy conservation. Toiletry items placed on L side of sink to facilitate visual scanning to L and increased L side attention with Pt able to locate 5/5 items with increased time. Pt able to wash face, shave face, and complete oral care with supervision min verbal cues required for cleanliness on L side during shaving +increased  time. Pt able to maintain attention to task without verbal cues this session demonstrating improved sustained attention during functional tasks. Pt was left resting in wc with call bell in reach, seatbelt alarm on, and all needs met.    Therapy Documentation Precautions:  Precautions Precautions: Fall, Other (comment) Precaution Comments: HOH (hears better in R ear), sundowning, L inattention, L vs posterior lean Restrictions Weight Bearing Restrictions: No   Therapy/Group: Individual Therapy  [name] 04/21/2023, 11:44 AM

## 2023-04-21 NOTE — Progress Notes (Signed)
Speech Language Pathology Daily Session Note  Patient Details  Name: Samuel Mata MRN: [id number] Date of Birth: [birth year]  Today's Date: 04/21/2023 SLP Individual Time: 0800-0900 SLP Individual Time Calculation (min): 60 min  Short Term Goals: Week 2: SLP Short Term Goal 1 (Week 2): STG = LTG due to ELOS  Skilled Therapeutic Interventions: Skilled therapy session focused on dysphagia and cognitive goals. SLP faciliated session by reviewing swallowing strategies with patient including small bites/sips, slow rate and lingual sweep to clear oral residuals. Patient with consumption of regular/thin textures with mild diffuse oral residuals cleared though subsequent swallows and liquid wash. Patient with occasional immediate cough and delayed throat clear throughout meal, though according to patient/daughter this is b/l. Recommend continuation of current diet due to patients age, CXR clear from s/sx of aspiration PNA, WBC WFL and patient has tolerated regular diet throughout inpatient stay. Recommend intermittent supervision to cue for compensatory strategies. SLP addressed cognition through review of memory strategies and use of memory book. Patient independently recalled today was his daughters birthday (Dec 5th) and independently utilized notebook to write his family a note night before. SLP provided supervisionA for patient to write journal entry today regarding ST session as well as upcoming PT/OT times and therapist names. Patient left in bed with alarm set and call bell in reach. Continue POC.  Pain None   Therapy/Group: Individual Therapy  Kaenan Jake M.A., CF-SLP 04/21/2023, 8:58 AM

## 2023-04-21 NOTE — Progress Notes (Signed)
PROGRESS NOTE   Subjective/Complaints:  Pt is aware of d/c date, no c/o, working with   ROS-denies fever, chills, chest pain, shortness of breath, abdominal pain, nausea, vomiting  Objective:   No results found. Recent Labs    04/21/23 0624  WBC 8.5  HGB 13.7  HCT 39.4  PLT 475*    Recent Labs    04/21/23 0624  NA 133*  K 4.1  CL 99  CO2 26  GLUCOSE 92  BUN 21  CREATININE 0.87  CALCIUM 8.9     Intake/Output Summary (Last 24 hours) at 04/21/2023 0845 Last data filed at 04/21/2023 0630 Gross per 24 hour  Intake 360 ml  Output 525 ml  Net -165 ml        Physical Exam: Vital Signs Blood pressure 134/70, pulse 66, temperature 97.8 F (36.6 C), temperature source Oral, resp. rate 20, height 5\' 7"  (1.702 m), weight 57.3 kg, SpO2 93%.   General: No acute distress Mood and affect are appropriate Heart: Regular rate and rhythm no rubs murmurs or extra sounds Lungs: Clear to auscultation, breathing unlabored, no rales or wheezes Abdomen: Positive bowel sounds, soft nontender to palpation, nondistended Extremities: No clubbing, cyanosis, or edema  Neurologic: Cranial nerves II through XII intact-hard of hearing, motor strength is 5/5 in bilateral deltoid, bicep, tricep, grip, hip flexor, knee extensors, ankle dorsiflexor and plantar flexor + dysdiadochokinesis with sup/pron LUE  LT sensation intact BUE  Visual fields intact to confrontation testing Musculoskeletal: Full range of motion in all 4 extremities. No joint swelling   Assessment/Plan: 1. Functional deficits which require 3+ hours per day of interdisciplinary therapy in a comprehensive inpatient rehab setting. Physiatrist is providing close team supervision and 24 hour management of active medical problems listed below. Physiatrist and rehab team continue to assess barriers to discharge/monitor patient progress toward functional and medical  goals  Care Tool:  Bathing    Body parts bathed by patient: Right arm, Left arm, Chest, Face, Front perineal area, Abdomen, Right upper leg, Left upper leg, Right lower leg, Left lower leg, Buttocks   Body parts bathed by helper: Buttocks     Bathing assist Assist Level: Minimal Assistance - Patient > 75%     Upper Body Dressing/Undressing Upper body dressing   What is the patient wearing?: Pull over shirt    Upper body assist Assist Level: Contact Guard/Touching assist    Lower Body Dressing/Undressing Lower body dressing      What is the patient wearing?: Pants, Underwear/pull up     Lower body assist Assist for lower body dressing: Minimal Assistance - Patient > 75%     Toileting Toileting    Toileting assist Assist for toileting: Minimal Assistance - Patient > 75%     Transfers Chair/bed transfer  Transfers assist     Chair/bed transfer assist level: Minimal Assistance - Patient > 75% Chair/bed transfer assistive device: Armrests, Geologist, engineering   Ambulation assist      Assist level: Minimal Assistance - Patient > 75% Assistive device: Walker-rolling Max distance: 276ft   Walk 10 feet activity   Assist     Assist level: Minimal Assistance -  Patient > 75% Assistive device: Walker-rolling   Walk 50 feet activity   Assist    Assist level: Minimal Assistance - Patient > 75% Assistive device: Walker-rolling    Walk 150 feet activity   Assist Walk 150 feet activity did not occur: Safety/medical concerns  Assist level: Minimal Assistance - Patient > 75% Assistive device: Walker-rolling    Walk 10 feet on uneven surface  activity   Assist Walk 10 feet on uneven surfaces activity did not occur: Safety/medical concerns         Wheelchair     Assist Is the patient using a wheelchair?: Yes Type of Wheelchair: Manual    Wheelchair assist level: Dependent - Patient 0%      Wheelchair 50 feet with 2 turns  activity    Assist        Assist Level: Dependent - Patient 0%   Wheelchair 150 feet activity     Assist      Assist Level: Dependent - Patient 0%   Blood pressure 134/70, pulse 66, temperature 97.8 F (36.6 C), temperature source Oral, resp. rate 20, height 5\' 7"  (1.702 m), weight 57.3 kg, SpO2 93%.  Medical Problem List and Plan: 1. Functional deficits secondary to R thalamic/capsular infarct on 04/07/23             -patient may  shower             -ELOS/Goals: 12/13 supervision-              Con't CIR PT, OT and SLP  15/7 schedule will discuss with team whether this cn be advance to regular schedule 2.  Antithrombotics: -DVT/anticoagulation:  Pharmaceutical: Lovenox             -antiplatelet therapy: DAPT x 3 weeks followed by Plavix alone.  3. Pain Management: tylenol prn.  4. Mood/Behavior/Sleep: LCSW to follow for evaluation and support.              -antipsychotic agents: N/A Somnolent states he did not sleep well check sleep graph , add melatonin, no sedating meds noted,  5. Neuropsych/cognition: This patient is? capable of making decisions on his own behalf, however is very HOH and maybe sundowning 6. Skin/Wound Care: Routine pressure relief measures.  7. Fluids/Electrolytes/Nutrition: Monitor I/O. BMET looks normal     Latest Ref Rng & Units 04/21/2023    6:24 AM 04/18/2023    6:23 AM 04/14/2023    5:00 AM  BMP  Glucose 70 - 99 mg/dL 92  96  95   BUN 8 - 23 mg/dL 21  18  13    Creatinine 0.61 - 1.24 mg/dL 6.38  7.56  4.33   Sodium 135 - 145 mmol/L 133  136  132   Potassium 3.5 - 5.1 mmol/L 4.1  4.4  3.8   Chloride 98 - 111 mmol/L 99  100  101   CO2 22 - 32 mmol/L 26  29  27    Calcium 8.9 - 10.3 mg/dL 8.9  9.6  8.7     8. HTN: Monitor BP TID--metoprolol resumed on 11/26.      Vitals:   04/20/23 2107 04/21/23 0323  BP: 117/78 134/70  Pulse: 76 66  Resp:  20  Temp:  97.8 F (36.6 C)  SpO2:  93%   12/5 controlled  on lower dose metoprolol 12.5  mg BID and amlodipine 5mg  daily 9. 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 Dyslipidemia: LDL-98-->now on Lipitor 80 mg daily 12. Non cardiac chest pain: Was started on metoprolol with prn NTG 11/15 by Dr. Melburn Popper.   13. GERD: Continue Pepcid. 14. 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  15. OSA?: Sleep study recommended at discharge.  16. Sundowning resolved  17. R dry eye/mild conjunctivitis  -Lubricating eyedrops started-medication was scheduled    LOS: 9 days A FACE TO FACE EVALUATION WAS PERFORMED  Erick Colace 04/21/2023, 8:45 AM

## 2023-04-21 NOTE — Progress Notes (Signed)
Physical Therapy Session Note  Patient Details  Name: Samuel Mata MRN: [id number] Date of Birth: [birth year]  Today's Date: 04/21/2023 PT Individual Time: 1440-1535 PT Individual Time Calculation (min): 55 min   Short Term Goals: Week 2:  PT Short Term Goal 1 (Week 2): = to LTGs based on ELOS  Skilled Therapeutic Interventions/Progress Updates:    Pt received supine in bed awake and agreeable to therapy session. Pt reports need to use bathroom. [name] daughter, [name], and wife, [name], arrived to observe therapy session today. Supine>sitting L EOB, HOB flat but using bedrail, with close SBA for safety and requiring increased time. Sit>stand EOB>RW with pt requiring 3x attempts prior to achieving upright due to repeated posterior LOB resulting in him sitting back down on bed immediately after his hips lifted off due to lack of anterior weight shift - therapist reinforcing cues to scoot forward, place feet back underneath BOS, push up from seat, and bring head forward towards front bar of RW for anterior weight shift - stood with light min A. Gait training ~61ft x2 in/out of bathroom using RW with CGA/light min A for steadying and cuing to turn fully and back up to seat completely before initiating sitting, this is improving some but inconsistent. Pt demos good AD management at sink, and stands to perform hand hygiene with CGA for safety and only 1x cuing to improve upright posture due to gradual slight L lateral lean with L knee crouched.     Transported to/from gym in w/c for time management and energy conservation.  Participated in group dance class to start therapy session with focus on dynamic balance while in a distracting environment - participated in dynamic standing balance ~69minutes while performing B UE reciprocal arm movements and then B LE alternating foot taps to 3" step - requires consistent min A and occasional mod A for balance due to continued strong L posterior lean with pt having  increased difficulty dual-tasking to maintain upright and recover balance in this environment.   Transported to other therapy gym for quieter environment to target balance for improved attention.   Gait training 274ft (186ft with L HHA progressed to 18ft without UE support) with light min assist for balance. Pt demonstrating the following gait deviations with therapist providing the described cuing and facilitation for improvement:  - has slightly wider BOS with B LEs slightly excessively externally rotated  - decreased foot clearance bilaterally (L>R) - improving weight shifting onto R stance phase to allow reciprocal stepping pattern on L LE  - improving midline orientation and symmetry of gait  Dynamic gait training of forward/backwards walking ~67ft x4 reps with no UE support with light min A during forward ambulation and min/mod A for balance during backwards gait to maintain upright, otherwise pt would have complete L posterior LOB. Cuing to increase L LE posterior step length to improve his balance and pt transitioned to step-to pattern backwards leading with L LE with slight improvement in balance, but then unable to transition back towards a reciprocal pattern.   Dynamic balance and L LE stepping balance recovery retraining via forward/backwards stepping over 6" hurdle with L HHA x6 reps progressed to 1x without UE support, but pt unable to step backwards without HHA - pt continues to have greatest challenge of stepping L LE backwards to regain upright, midline balance when L posterior LOB occurs.   Therapist reinforced education with both pt and family regarding recommendation for him to utilize RW at D/C and all reported understanding  and in agreement.   Transported back to room and pt remaining up in w/c to visit with his family, left with needs in reach and seat belt alarm on.   Therapy Documentation Precautions:  Precautions Precautions: Fall, Other (comment) Precaution Comments:  HOH (hears better in R ear), sundowning, L inattention, L vs posterior lean Restrictions Weight Bearing Restrictions: No   Pain:  No reports of pain throughout session.    Therapy/Group: Individual Therapy  Ginny Forth , PT, DPT, NCS, CSRS 04/21/2023, 12:41 PM

## 2023-04-22 DIAGNOSIS — I6381 Other cerebral infarction due to occlusion or stenosis of small artery: Secondary | ICD-10-CM | POA: Diagnosis not present

## 2023-04-22 DIAGNOSIS — I10 Essential (primary) hypertension: Secondary | ICD-10-CM | POA: Diagnosis not present

## 2023-04-22 DIAGNOSIS — F05 Delirium due to known physiological condition: Secondary | ICD-10-CM | POA: Diagnosis not present

## 2023-04-22 NOTE — Progress Notes (Signed)
PROGRESS NOTE   Subjective/Complaints:  Working with OT, in good spirits   ROS-denies fever, chills, chest pain, shortness of breath, abdominal pain, nausea, vomiting  Objective:   No results found. Recent Labs    04/21/23 0624  WBC 8.5  HGB 13.7  HCT 39.4  PLT 475*    Recent Labs    04/21/23 0624  NA 133*  K 4.1  CL 99  CO2 26  GLUCOSE 92  BUN 21  CREATININE 0.87  CALCIUM 8.9     Intake/Output Summary (Last 24 hours) at 04/22/2023 0826 Last data filed at 04/22/2023 0802 Gross per 24 hour  Intake 974 ml  Output 700 ml  Net 274 ml        Physical Exam: Vital Signs Blood pressure (!) 141/76, pulse 60, temperature (!) 97.5 F (36.4 C), resp. rate 18, height 5\' 7"  (1.702 m), weight 57.3 kg, SpO2 96%.   General: No acute distress Mood and affect are appropriate Heart: Regular rate and rhythm no rubs murmurs or extra sounds Lungs: Clear to auscultation, breathing unlabored, no rales or wheezes Abdomen: Positive bowel sounds, soft nontender to palpation, nondistended Extremities: No clubbing, cyanosis, or edema  Neurologic: Cranial nerves II through XII intact-hard of hearing, motor strength is 5/5 in bilateral deltoid, bicep, tricep, grip, hip flexor, knee extensors, ankle dorsiflexor and plantar flexor + dysdiadochokinesis with sup/pron LUE  LT sensation intact BUE   Musculoskeletal: Full range of motion in all 4 extremities. No joint swelling   Assessment/Plan: 1. Functional deficits which require 3+ hours per day of interdisciplinary therapy in a comprehensive inpatient rehab setting. Physiatrist is providing close team supervision and 24 hour management of active medical problems listed below. Physiatrist and rehab team continue to assess barriers to discharge/monitor patient progress toward functional and medical goals  Care Tool:  Bathing    Body parts bathed by patient: Right arm, Left  arm, Chest, Face, Front perineal area, Abdomen, Right upper leg, Left upper leg, Right lower leg, Left lower leg, Buttocks   Body parts bathed by helper: Buttocks     Bathing assist Assist Level: Minimal Assistance - Patient > 75%     Upper Body Dressing/Undressing Upper body dressing   What is the patient wearing?: Pull over shirt    Upper body assist Assist Level: Contact Guard/Touching assist    Lower Body Dressing/Undressing Lower body dressing      What is the patient wearing?: Pants, Underwear/pull up     Lower body assist Assist for lower body dressing: Minimal Assistance - Patient > 75%     Toileting Toileting    Toileting assist Assist for toileting: Minimal Assistance - Patient > 75%     Transfers Chair/bed transfer  Transfers assist     Chair/bed transfer assist level: Minimal Assistance - Patient > 75% Chair/bed transfer assistive device: Armrests, Geologist, engineering   Ambulation assist      Assist level: Minimal Assistance - Patient > 75% Assistive device: Walker-rolling Max distance: 232ft   Walk 10 feet activity   Assist     Assist level: Minimal Assistance - Patient > 75% Assistive device: Walker-rolling   Walk 50  feet activity   Assist    Assist level: Minimal Assistance - Patient > 75% Assistive device: Walker-rolling    Walk 150 feet activity   Assist Walk 150 feet activity did not occur: Safety/medical concerns  Assist level: Minimal Assistance - Patient > 75% Assistive device: Walker-rolling    Walk 10 feet on uneven surface  activity   Assist Walk 10 feet on uneven surfaces activity did not occur: Safety/medical concerns         Wheelchair     Assist Is the patient using a wheelchair?: Yes Type of Wheelchair: Manual    Wheelchair assist level: Dependent - Patient 0%      Wheelchair 50 feet with 2 turns activity    Assist        Assist Level: Dependent - Patient 0%    Wheelchair 150 feet activity     Assist      Assist Level: Dependent - Patient 0%   Blood pressure (!) 141/76, pulse 60, temperature (!) 97.5 F (36.4 C), resp. rate 18, height 5\' 7"  (1.702 m), weight 57.3 kg, SpO2 96%.  Medical Problem List and Plan: 1. 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  Antithrombotics: -DVT/anticoagulation:  Pharmaceutical: Lovenox             -antiplatelet therapy: DAPT x 3 weeks followed by Plavix alone.  3. Pain Management: tylenol prn.  4. Mood/Behavior/Sleep: LCSW to follow for evaluation and support.              -antipsychotic agents: N/A Somnolent states he did not sleep well check sleep graph , add melatonin, no sedating meds noted,  5. Neuropsych/cognition: This patient is? capable of making decisions on his own behalf, however is very HOH and maybe sundowning 6. Skin/Wound Care: Routine pressure relief measures.  7. Fluids/Electrolytes/Nutrition: Monitor I/O. BMET looks normal     Latest Ref Rng & Units 04/21/2023    6:24 AM 04/18/2023    6:23 AM 04/14/2023    5:00 AM  BMP  Glucose 70 - 99 mg/dL 92  96  95   BUN 8 - 23 mg/dL 21  18  13    Creatinine 0.61 - 1.24 mg/dL 1.02  7.25  3.66   Sodium 135 - 145 mmol/L 133  136  132   Potassium 3.5 - 5.1 mmol/L 4.1  4.4  3.8   Chloride 98 - 111 mmol/L 99  100  101   CO2 22 - 32 mmol/L 26  29  27    Calcium 8.9 - 10.3 mg/dL 8.9  9.6  8.7     8. HTN: Monitor BP TID--metoprolol resumed on 11/26.      Vitals:   04/21/23 2059 04/22/23 0310  BP: 123/60 (!) 141/76  Pulse: 77 60  Resp:  18  Temp:  (!) 97.5 F (36.4 C)  SpO2:  96%   12/6 controlled  on lower dose metoprolol 12.5 mg BID and amlodipine 5mg  daily 9. 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 Dyslipidemia: LDL-98-->now on Lipitor 80 mg daily 12. Non cardiac chest pain: Was started on metoprolol with prn NTG 11/15 by Dr. Melburn Popper.   13. GERD: Continue Pepcid. 14. 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  15. OSA?: Sleep study recommended at discharge.  16. Sundowning resolved  17. R dry eye/mild conjunctivitis  -Lubricating eyedrops started-medication was scheduled    LOS: 10 days A FACE TO FACE EVALUATION WAS PERFORMED  Erick Colace 04/22/2023, 8:26 AM

## 2023-04-22 NOTE — Progress Notes (Signed)
Occupational Therapy Session Note  Patient Details  Name: Samuel Mata MRN: [id number] Date of Birth: Jul 29, 1930  Today's Date: 04/22/2023 OT Individual Time: 0102-7253 OT Individual Time Calculation (min): 56 min    Short Term Goals: Week 2:  OT Short Term Goal 1 (Week 2): STG=LTG d/t ELOS  Skilled Therapeutic Interventions/Progress Updates:     Pt received sitting up in bed finishing breakfast with nursing staff present in room. Pt handed off to OT for skilled therapy session with focus on BADL retraining, dynamic standing balance, and RW management. Pt presenting to be in good spirits receptive to skilled OT session reporting 0/10 pain- OT offering intermittent rest breaks, repositioning, and therapeutic support to optimize participation in therapy session.   Pt transitioned to EOB using bed features with supervision +increased time. Pt noted to be lightly soiled in urine d/t small incontinent void or potentially spilling small amount of urine during previous void- Pt receptive to changing clothes during session. Pt doffed underwear in sitting, leaning side to side to bring pants off hips- Pt leaning posteriorly during task requiring max verbal cues required to correct postural alignment and for awareness to posterior lean. Pt then requesting to use urinal with education provided on positioning to increase [name] independence in urinal use upon d/c. Pt with continent void in urinal documented in flowsheet- Pt completed task with supervision with verbal cues required for safety and to prevent spillage. Pt donned pants sitting EOB leaning forwards to weave feet and stood with light min A to bring pants to waist for standing balance d/t posterior lean. Pt required 3x attempts to rise into standing position d/t decreased anterior lean with re-educated on technique for sit <>stands provided.  Engaged Pt in functional mobility from EOB>wc using RW with Pt requiring CGA/light min A for balance and RW  management and min verbal cues for L foot placement. Improved attention and RW management noted this trial.   MD in/out for morning rounding .   Pt completed grooming/hygieine standing at sink for increased balance and endurance challenge. Educated Pt on completing grooming/hygiene tasks in seated position at home to increase safety and for energy conservation with Pt receptive to education- tasks completed in standing during session for therapeutic intervention only. Pt able to maintain standing balance during oral care and while grooming his hair with CGA  for >4 minutes with seated rest break provided following. Pt wash face in sitting position to increase safety and conserve energy.   RN in/out to provide morning medications.   Engaged Pt in functional mobility training with emphasis on RW management when changing directions and turning to sit in a chair as Pt demonstrated greatest challenge with problem solving, awareness, and attention during this skill. Pt completed ~10 ft of functional mobility from wc <> EOB using RW x3 trials with CGA required and mod verbal cues (fading to min during third trial) for RW management and L foot placement.   Pt was left resting in wc with call bell in reach, seatbelt alarm on, and all needs met.    Therapy Documentation Precautions:  Precautions Precautions: Fall, Other (comment) Precaution Comments: HOH (hears better in R ear), sundowning, L inattention, L vs posterior lean Restrictions Weight Bearing Restrictions: No   Therapy/Group: Individual Therapy  Samuel Mata 04/22/2023, 7:56 AM

## 2023-04-22 NOTE — Progress Notes (Signed)
Occupational Therapy Session Note  Patient Details  Name: Samuel Mata MRN: [id number] Date of Birth: [birth year]  {CHL IP REHAB OT TIME CALCULATIONS:[id number]}   Short Term Goals: Week 2:  OT Short Term Goal 1 (Week 2): STG=LTG d/t ELOS  Skilled Therapeutic Interventions/Progress Updates:  Pt greeted *** for skilled OT session with focus on ***.   Pain: Pt reported ***/10 pain, stating "***" in reference to ***. OT offering intermediate rest breaks and positioning suggestions throughout session to address pain/fatigue and maximize participation/safety in session.   Functional Transfers:  Self Care Tasks:  Therapeutic Activities:  Therapeutic Exercise:   Education:  Pt remained *** with 4Ps assessed and immediate needs met. Pt continues to be appropriate for skilled OT intervention to promote further functional independence in ADLs/IADLs.   Therapy Documentation Precautions:  Precautions Precautions: Fall, Other (comment) Precaution Comments: HOH (hears better in R ear), sundowning, L inattention, L vs posterior lean Restrictions Weight Bearing Restrictions: No   Therapy/Group: Individual Therapy  Lou Cal, OTR/L, MSOT  04/22/2023, 7:30 PM

## 2023-04-22 NOTE — Progress Notes (Signed)
Physical Therapy Session Note  Patient Details  Name: Samuel Mata MRN: [id number] Date of Birth: February 14, 1931  Today's Date: 04/22/2023 PT Individual Time: 1001-1100 PT Individual Time Calculation (min): 59 min   Short Term Goals: Week 2:  PT Short Term Goal 1 (Week 2): = to LTGs based on ELOS  Skilled Therapeutic Interventions/Progress Updates: Pt presents sitting in w/c w/ dtr present and agreeable to therapy.  Pt transfers sit to stand w/ min A/CGA although from various surfaces may need more cueing for sequencing, especially w/ forward scoot and lean.  Pt tends to rock back on heels.  Pt amb > 150' throughout session w/ min A w/o AD and cues for posture and step length.  Pt amb w/ "gunfighter" walk - ER hips and wide UES.  Pt performed block sit to stands w/ min A and cues for forward lean, toe taps to 6" platform w/ increased sway and LOB to side, then switched to 3 3/4" w/ improved performance.  Pt then performed sit to stand w/ RLE on 3 3/4" platform to increase NMR to L LE.  Pt performed all standing activities w/ mirror for visual input.  Pt amb to room and remained sitting in w/c w/ chair alarm on and all needs in reach.      Therapy Documentation Precautions:  Precautions Precautions: Fall, Other (comment) Precaution Comments: HOH (hears better in R ear), sundowning, L inattention, L vs posterior lean Restrictions Weight Bearing Restrictions: No General:   Vital Signs:   Pain: 0/10      Therapy/Group: Individual Therapy  [name] 04/22/2023, 11:02 AM

## 2023-04-22 NOTE — Progress Notes (Signed)
Speech Language Pathology Daily Session Note  Patient Details  Name: Samuel Mata MRN: [id number] Date of Birth: March 06, 1931  Today's Date: 04/22/2023 SLP Individual Time: 1346-1431 SLP Individual Time Calculation (min): 45 min  Short Term Goals: Week 2: SLP Short Term Goal 1 (Week 2): STG = LTG due to ELOS  Skilled Therapeutic Interventions: Skilled therapy session focused on cognitive goals. SLP faciliated session by providing supervision A during medication management task. SLP verbalized and wrote instructions for patient to accurately fill out BID pillbox. Patient utilized problem solving and organization skills to accurately place pills and self correct necessary errors. Patient left in chair with alarm set and call bell in reach. Continue POC.   Pain  None reported   Therapy/Group: Individual Therapy  Skiler Tye M.A., CF-SLP 04/22/2023, 2:10 PM

## 2023-04-22 NOTE — Progress Notes (Signed)
Physical Therapy Session Note  Patient Details  Name: [name] MRN: [id number] Date of Birth: 1930-07-20  Today's Date: 04/22/2023 PT Individual Time: 1430-1530 PT Individual Time Calculation (min): 60 min   Short Term Goals: Week 2:  PT Short Term Goal 1 (Week 2): = to LTGs based on ELOS  Skilled Therapeutic Interventions/Progress Updates:      Pt sitting upright in wheelchair - agreeable to therapy treatment. No complaints of pain.   Transported to day room rehab gym for time management. Sit<>stand with R HHA and no AD with minA - cues for forward weight shifting and pushing with LUE from arm rest. He ambulates 12ft with R HHA and minA - cues for upright and forward gaze, increasing L step length, and stability while turning. Ambulated another 175ft after a brief rest break with no UE support to challenge gait and balance, demonstrates BLE slightly externally rotated with decreased foot clearance on L > R. Improved gait speed compared to when providing HHA.   Continued to challenge gait training without UE support to facilitate balance recovery and dynamic balance - ambulating while tossing ball to rehab tech with min/modA for balance, including turns and straight path ambulating. Then worked on bouncing ball to himself while ambulating with min/modA for balance. He also enjoyed shooting the basketball into the goal. Frequent LOB with delayed and insufficient stepping strategies. MinA for picking up the ball from the ground. Distances variable depending on fatigue.   Pt assisted on Kinetron with minA for balance. Seat belt used for safety. Completed x8 minutes at L50cm/sec resistance and encouraged AROM in BLE for terminal extension in both legs.   Returned to his room and patient requesting to complete oral care at the sink. Pt confused on time of day and unsure if he just finished lunch vs dinner - reoriented and oral care completed while seated.   Pt ended session seated in w/c  with safety belt alarm on, all needs within reach.    Therapy Documentation Precautions:  Precautions Precautions: Fall, Other (comment) Precaution Comments: HOH (hears better in R ear), sundowning, L inattention, L vs posterior lean Restrictions Weight Bearing Restrictions: No General:     Therapy/Group: Individual Therapy  Orrin Brigham 04/22/2023, 2:48 PM

## 2023-04-23 DIAGNOSIS — I6381 Other cerebral infarction due to occlusion or stenosis of small artery: Secondary | ICD-10-CM | POA: Diagnosis not present

## 2023-04-23 NOTE — Progress Notes (Signed)
Physical Therapy Session Note  Patient Details  Name: Samuel Mata MRN: [id number] Date of Birth: 17-Aug-1930  Today's Date: 04/23/2023 PT Individual Time: 1123-1206 PT Individual Time Calculation (min): 43 min   Short Term Goals: Week 2:  PT Short Term Goal 1 (Week 2): = to LTGs based on ELOS  Skilled Therapeutic Interventions/Progress Updates:    Pt received supine in bed awake, reporting he is currently using the urinal. Pt agreeable to therapy session. Supine>sitting L EOB, HOB slightly elevated and using bedrail, with supervision. Sit<>stands with pt having improving recall of scooting forward, bring feet back underneath his BOS, and lean trunk forward - continues to have slight support of backs of legs on seat while rising - CGA for safety throughout  Gait training ~181ft using RW to the main gym with CGA for safety/steadying. Pt demonstrating the following gait deviations with therapist providing the described cuing and facilitation for improvement:  - continues to have slightly wider BOS with B LEs minimally externally rotated - decreased R weight shift onto R stance phase causing worse L LE foot clearance during swing  - continues to keep RW slightly too far forward  - cuing throughout for R weight shift and increased L LE foot clearance  Dynamic gait training using agility ladder including the following:  - forward step-to with goal of progressing to reciprocal stepping pattern (only able to perform ~4 in a row before LOB), no UE support, down/back x4 reps with min A for balance due to continued difficulty sustaining his R weight shift causing poor L foot clearance and L LOB requiring heavy min A to maintain upright   Pt reporting increased fatigue today and requiring frequent seated rest breaks.   Gait training ~172ft back to room using RW with continued CGA for safety - pt demos worsening of his L lean and poorer L LE foot clearance compared to at start of session, likely due to  fatigue.  Pt agreeable to remain sitting upright for lunch - left sitting in w/c with needs in reach and seat belt alarm on.  Therapy Documentation Precautions:  Precautions Precautions: Fall, Other (comment) Precaution Comments: HOH (hears better in R ear), sundowning, L inattention, L vs posterior lean Restrictions Weight Bearing Restrictions: No   Pain:  No reports of pain throughout session.   Therapy/Group: Individual Therapy  Ginny Forth , PT, DPT, NCS, CSRS 04/23/2023, 8:04 AM

## 2023-04-23 NOTE — Progress Notes (Signed)
Speech Language Pathology Daily Session Note  Patient Details  Name: Samuel Mata MRN: [id number] Date of Birth: 01/21/1931  Today's Date: 04/23/2023 SLP Individual Time: 0800-0845 SLP Individual Time Calculation (min): 45 min  Short Term Goals: Week 2: SLP Short Term Goal 1 (Week 2): STG = LTG due to ELOS  Skilled Therapeutic Interventions: Skilled therapy session focused on cognitive goals. SLP facilated session by providing supervision A during temporal orientation task using a calendar. Furthermore, patient able to identify holidays and important days this month given supervision A. SLP continued to address cognitine skills through creation of calendar task. SLP verbalized instructions for patient to write events on calendar. Patient wrote events accurately given supervisionA with strategy to utilize line during multi-day events. Upon completion of calendar, patient answered comprehension questions given minA. At the end of the session, patient required minA to complete memory book for todays session. Patient left in bed with alarm set and call bell in reach. Continue POC.   Pain Denies  Therapy/Group: Individual Therapy Jaelle Campanile M.A., CF-SLP  04/23/2023, 7:37 AM

## 2023-04-23 NOTE — Progress Notes (Signed)
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 Recent Labs    04/21/23 0624  WBC 8.5  HGB 13.7  HCT 39.4  PLT 475*    Recent Labs    04/21/23 0624  NA 133*  K 4.1  CL 99  CO2 26  GLUCOSE 92  BUN 21  CREATININE 0.87  CALCIUM 8.9     Intake/Output Summary (Last 24 hours) at 04/23/2023 1619 Last data filed at 04/23/2023 1123 Gross per 24 hour  Intake 477 ml  Output 590 ml  Net -113 ml        Physical Exam: Vital Signs Blood pressure 108/65, pulse 76, temperature 98 F (36.7 C), temperature source Oral, resp. rate 17, height 5\' 7"  (1.702 m), weight 57.3 kg, SpO2 96%.   General: No acute distress ENT: hard of hearing Mood and affect are appropriate Heart: Regular rate and rhythm no rubs murmurs or extra sounds Lungs: Clear to auscultation, breathing unlabored, no rales or wheezes Abdomen: Positive bowel sounds, soft nontender to palpation, nondistended Extremities: No clubbing, cyanosis, or edema  Neurologic: Cranial nerves II through XII intact-hard of hearing, motor strength is 5/5 in bilateral deltoid, bicep, tricep, grip, hip flexor, knee extensors, ankle dorsiflexor and plantar flexor + dysdiadochokinesis with sup/pron LUE  LT sensation intact BUE   Musculoskeletal: Full range of motion in all 4 extremities. No joint swelling   Assessment/Plan: 1. Functional deficits which require 3+ hours per day of interdisciplinary therapy in a comprehensive inpatient rehab setting. Physiatrist is providing close team supervision and 24 hour management of active medical problems listed below. Physiatrist and rehab team continue to assess barriers to discharge/monitor patient progress  toward functional and medical goals  Care Tool:  Bathing    Body parts bathed by patient: Right arm, Left arm, Chest, Face, Front perineal area, Abdomen, Right upper leg, Left upper leg, Right lower leg, Left lower leg, Buttocks   Body parts bathed by helper: Buttocks     Bathing assist Assist Level: Minimal Assistance - Patient > 75%     Upper Body Dressing/Undressing Upper body dressing   What is the patient wearing?: Pull over shirt    Upper body assist Assist Level: Contact Guard/Touching assist    Lower Body Dressing/Undressing Lower body dressing      What is the patient wearing?: Pants, Underwear/pull up     Lower body assist Assist for lower body dressing: Minimal Assistance - Patient > 75%     Toileting Toileting    Toileting assist Assist for toileting: Minimal Assistance - Patient > 75%     Transfers Chair/bed transfer  Transfers assist     Chair/bed transfer assist level: Minimal Assistance - Patient > 75% Chair/bed transfer assistive device: Armrests, Geologist, engineering   Ambulation assist      Assist level: Minimal Assistance - Patient > 75% Assistive device: No Device Max distance: 150+   Walk 10 feet activity   Assist  Assist level: Minimal Assistance - Patient > 75% Assistive device: No Device   Walk 50 feet activity   Assist    Assist level: Minimal Assistance - Patient > 75% Assistive device: No Device    Walk 150 feet activity   Assist Walk 150 feet activity did not occur: Safety/medical concerns  Assist level: Minimal Assistance - Patient > 75% Assistive device: No Device    Walk 10 feet on uneven surface  activity   Assist Walk 10 feet on uneven surfaces activity did not occur: Safety/medical concerns         Wheelchair     Assist Is the patient using a wheelchair?: Yes Type of Wheelchair: Manual    Wheelchair assist level: Dependent - Patient 0%      Wheelchair 50 feet with  2 turns activity    Assist        Assist Level: Dependent - Patient 0%   Wheelchair 150 feet activity     Assist      Assist Level: Dependent - Patient 0%   Blood pressure 108/65, pulse 76, temperature 98 F (36.7 C), temperature source Oral, resp. rate 17, height 5\' 7"  (1.702 m), weight 57.3 kg, SpO2 96%.  Medical Problem List and Plan: 1. 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  Antithrombotics: -DVT/anticoagulation:  Pharmaceutical: Lovenox             -antiplatelet therapy: DAPT x 3 weeks followed by Plavix alone.  3. Pain Management: tylenol prn.  4. Mood/Behavior/Sleep: LCSW to follow for evaluation and support.              -antipsychotic agents: N/A Somnolent states he did not sleep well check sleep graph , add melatonin, no sedating meds noted,  5. Neuropsych/cognition: This patient is? capable of making decisions on his own behalf, however is very HOH and maybe sundowning 6. Skin/Wound Care: Routine pressure relief measures.  7. Fluids/Electrolytes/Nutrition: Monitor I/O. BMET looks normal     Latest Ref Rng & Units 04/21/2023    6:24 AM 04/18/2023    6:23 AM 04/14/2023    5:00 AM  BMP  Glucose 70 - 99 mg/dL 92  96  95   BUN 8 - 23 mg/dL 21  18  13    Creatinine 0.61 - 1.24 mg/dL 0.98  1.19  1.47   Sodium 135 - 145 mmol/L 133  136  132   Potassium 3.5 - 5.1 mmol/L 4.1  4.4  3.8   Chloride 98 - 111 mmol/L 99  100  101   CO2 22 - 32 mmol/L 26  29  27    Calcium 8.9 - 10.3 mg/dL 8.9  9.6  8.7     8. HTN: Monitor BP TID--metoprolol resumed on 11/26, continue      Vitals:   04/23/23 0640 04/23/23 1305  BP: 134/69 108/65  Pulse: 63 76  Resp: 18 17  Temp: 97.8 F (36.6 C) 98 F (36.7 C)  SpO2: 97% 96%   12/6 controlled  on lower dose metoprolol 12.5 mg BID and amlodipine 5mg   daily 9. 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 Dyslipidemia: LDL-98-->continue Lipitor 80 mg daily 12. Non cardiac chest pain: Was started on metoprolol with prn NTG 11/15 by Dr. Melburn Popper.   13. GERD: continue Pepcid. 14. 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  15. OSA?: Sleep study recommended at discharge.  16. Sundowning resolved  17. R dry eye/mild conjunctivitis  -Lubricating eyedrops started-medication was scheduled    LOS: 11 days A FACE TO FACE EVALUATION WAS PERFORMED  Drema Pry Shanard Treto 04/23/2023, 4:19 PM

## 2023-04-24 DIAGNOSIS — I6381 Other cerebral infarction due to occlusion or stenosis of small artery: Secondary | ICD-10-CM | POA: Diagnosis not present

## 2023-04-24 NOTE — Progress Notes (Addendum)
Occupational Therapy Session Note  Patient Details  Name: Samuel Mata MRN: [id number] Date of Birth: January 24, 1931  Today's Date: 04/24/2023 OT Individual Time: 1009-1050+1303-1318 ( 30 mins missed; pt eating lunch)  OT Individual Time Calculation (min): 41 min    Short Term Goals: Week 2:  OT Short Term Goal 1 (Week 2): STG=LTG d/t ELOS  Skilled Therapeutic Interventions/Progress Updates:  Session 1: Pt greeted supine in bed, pt agreeable to OT intervention.      Transfers/bed mobility: pt completed supine>sit with supervision. Pt completed all functional mobility with Rw and cGA.   ADLs:  Grooming: pt completed oral care seated at sink with supervision  UB dressing:pt donned OH shirt with set- up assist LB dressing: pt donned pants with CGA for balance in standing when pulling pants to waist line Footwear: donned socks with total A for time mgmt  Bathing: pt completed bathing seated/standing with CGA. Emphasis on on bimanual tasks during bathing as well as safety awareness.  Toileting: pt completed 3/3 toileting tasks with CGA, continent urine void.   Ended session with pt seated at sink with NT present.                  Session 2: Pt greeted supine in bed stating that he was trying to take a nap but unaware that lunch had already been delivered and requested to eat lunch.   Transfers/bed mobility: pt completed supine>sit with supervision. Pt sat EOB for self feeding  ADLs:   Toileting: pt with continent urine void using urinal d/t urgency, supervision for 3/3 toileting tasks from EOB.  Self feeding: provided set- up assist for lunch tray I.e opening items but pt able to self feed with only supervision.   Ended session with pt seated EOB eating lunch with NT present as pt is full sup.                Therapy Documentation Precautions:  Precautions Precautions: Fall, Other (comment) Precaution Comments: HOH (hears better in R ear), sundowning, L inattention, L vs  posterior lean Restrictions Weight Bearing Restrictions: No  Pain: No pain reported during either session    Therapy/Group: Individual Therapy  [facility] [place] 04/24/2023, 12:26 PM

## 2023-04-24 NOTE — Progress Notes (Signed)
Physical Therapy Session Note  Patient Details  Name: Sheik Bogacki MRN: [id number] Date of Birth: [birth year]  Today's Date: 04/24/2023 PT Individual Time: 4401-0272 PT Individual Time Calculation (min): 28 min   Short Term Goals: Week 2:  PT Short Term Goal 1 (Week 2): = to LTGs based on ELOS  Skilled Therapeutic Interventions/Progress Updates:      Pt sleeping in bed- wakens to voice and in agreement to therapy treatment. No reports of pain.  Supine<>sitting EOB with supervision using hospital bed features. Donned slip-on shoes with assist for time. Sit<>stand to RW with CGA with cues for forward weight shifting. He ambulated from his room to main rehab gym next to the stairs,~272ft, with CGA and RW - cues for keeping body within walker frame, staying in the middle of walker frame (tends to favor his L side and would sometimes kick the walker frame as he stepped with his L foot). Brief seated rest break and then instructed in stair training. He navigated up/down x12, 6" stairs using 2 hand rails. CGA for ascent and light minA for descent. Light minA needed for managing his posterior bias. He had a step-to pattern for both directions. Ambulated back to his room with similar assist and cues as above - able to recall direction and room #. Assist for lying in bed, posey belt donned, bed alarm on.   Therapy Documentation Precautions:  Precautions Precautions: Fall, Other (comment) Precaution Comments: HOH (hears better in R ear), sundowning, L inattention, L vs posterior lean Restrictions Weight Bearing Restrictions: No General:     Therapy/Group: Individual Therapy  Orrin Brigham 04/24/2023, 7:36 AM

## 2023-04-24 NOTE — Progress Notes (Signed)
PROGRESS NOTE   Subjective/Complaints: No new complaints this morning Reading the newspaper Discussed grounds pass, his family is visiting this afternoon  ROS-denies fever, chills, chest pain, shortness of breath, abdominal pain, nausea, vomiting, +hard of hearing  Objective:   No results found. No results for input(s): "WBC", "HGB", "HCT", "PLT" in the last 72 hours.   No results for input(s): "NA", "K", "CL", "CO2", "GLUCOSE", "BUN", "CREATININE", "CALCIUM" in the last 72 hours.    Intake/Output Summary (Last 24 hours) at 04/24/2023 1517 Last data filed at 04/24/2023 1331 Gross per 24 hour  Intake 700 ml  Output 975 ml  Net -275 ml        Physical Exam: Vital Signs Blood pressure 127/79, pulse 72, temperature 97.9 F (36.6 C), temperature source Oral, resp. rate 18, height 5\' 7"  (1.702 m), weight 57.3 kg, SpO2 98%.   General: No acute distress ENT: hard of hearing Mood and affect are appropriate Heart: Regular rate and rhythm no rubs murmurs or extra sounds Lungs: Clear to auscultation, breathing unlabored, no rales or wheezes Abdomen: Positive bowel sounds, soft nontender to palpation, nondistended Extremities: No clubbing, cyanosis, or edema  Neurologic: Cranial nerves II through XII intact-hard of hearing, motor strength is 5/5 in bilateral deltoid, bicep, tricep, grip, hip flexor, knee extensors, ankle dorsiflexor and plantar flexor + dysdiadochokinesis with sup/pron LUE  LT sensation intact BUE, stable 12/8  Musculoskeletal: Full range of motion in all 4 extremities. No joint swelling   Assessment/Plan: 1. Functional deficits which require 3+ hours per day of interdisciplinary therapy in a comprehensive inpatient rehab setting. Physiatrist is providing close team supervision and 24 hour management of active medical problems listed below. Physiatrist and rehab team continue to assess barriers to  discharge/monitor patient progress toward functional and medical goals  Care Tool:  Bathing    Body parts bathed by patient: Right arm, Left arm, Chest, Face, Front perineal area, Abdomen, Right upper leg, Left upper leg, Right lower leg, Left lower leg, Buttocks   Body parts bathed by helper: Buttocks     Bathing assist Assist Level: Contact Guard/Touching assist     Upper Body Dressing/Undressing Upper body dressing   What is the patient wearing?: Pull over shirt    Upper body assist Assist Level: Set up assist    Lower Body Dressing/Undressing Lower body dressing      What is the patient wearing?: Underwear/pull up, Pants     Lower body assist Assist for lower body dressing: Contact Guard/Touching assist     Toileting Toileting    Toileting assist Assist for toileting: Contact Guard/Touching assist     Transfers Chair/bed transfer  Transfers assist     Chair/bed transfer assist level: Contact Guard/Touching assist Chair/bed transfer assistive device: Armrests, Geologist, engineering   Ambulation assist      Assist level: Minimal Assistance - Patient > 75% Assistive device: No Device Max distance: 150+   Walk 10 feet activity   Assist     Assist level: Minimal Assistance - Patient > 75% Assistive device: No Device   Walk 50 feet activity   Assist    Assist level: Minimal Assistance - Patient >  75% Assistive device: No Device    Walk 150 feet activity   Assist Walk 150 feet activity did not occur: Safety/medical concerns  Assist level: Minimal Assistance - Patient > 75% Assistive device: No Device    Walk 10 feet on uneven surface  activity   Assist Walk 10 feet on uneven surfaces activity did not occur: Safety/medical concerns         Wheelchair     Assist Is the patient using a wheelchair?: Yes Type of Wheelchair: Manual    Wheelchair assist level: Dependent - Patient 0%      Wheelchair 50 feet with  2 turns activity    Assist        Assist Level: Dependent - Patient 0%   Wheelchair 150 feet activity     Assist      Assist Level: Dependent - Patient 0%   Blood pressure 127/79, pulse 72, temperature 97.9 F (36.6 C), temperature source Oral, resp. rate 18, height 5\' 7"  (1.702 m), weight 57.3 kg, SpO2 98%.  Medical Problem List and Plan: 1. Functional deficits secondary to R thalamic/capsular infarct on 04/07/23             -patient may  shower             -ELOS/Goals: 12/13 supervision-              Continue CIR PT, OT and SLP  15/7 schedule   Grounds pass ordered 2.  Antithrombotics: -DVT/anticoagulation:  Pharmaceutical: Lovenox             -antiplatelet therapy: DAPT x 3 weeks followed by Plavix alone.  3. Pain Management: tylenol prn.  4. Mood/Behavior/Sleep: LCSW to follow for evaluation and support.              -antipsychotic agents: N/A Somnolent states he did not sleep well check sleep graph , add melatonin, no sedating meds noted,  5. Neuropsych/cognition: This patient is? capable of making decisions on his own behalf, however is very HOH and maybe sundowning 6. Skin/Wound Care: Routine pressure relief measures.  7. Fluids/Electrolytes/Nutrition: Monitor I/O. BMET looks normal     Latest Ref Rng & Units 04/21/2023    6:24 AM 04/18/2023    6:23 AM 04/14/2023    5:00 AM  BMP  Glucose 70 - 99 mg/dL 92  96  95   BUN 8 - 23 mg/dL 21  18  13    Creatinine 0.61 - 1.24 mg/dL 1.61  0.96  0.45   Sodium 135 - 145 mmol/L 133  136  132   Potassium 3.5 - 5.1 mmol/L 4.1  4.4  3.8   Chloride 98 - 111 mmol/L 99  100  101   CO2 22 - 32 mmol/L 26  29  27    Calcium 8.9 - 10.3 mg/dL 8.9  9.6  8.7     8. HTN: Monitor BP TID--metoprolol resumed on 11/26, continue      Vitals:   04/23/23 2035 04/24/23 1339  BP: 122/61 127/79  Pulse: 75 72  Resp:  18  Temp:  97.9 F (36.6 C)  SpO2:  98%   12/6 controlled  on lower dose metoprolol 12.5 mg BID and amlodipine 5mg   daily 9. 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 Dyslipidemia: LDL-98-->continue Lipitor 80 mg daily 12. Non cardiac chest pain: Was started on metoprolol with prn NTG 11/15 by Dr. Melburn Popper.   13. GERD: continue Pepcid. 14. 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  15. OSA?: Sleep study recommended at discharge.  16. Sundowning resolved  17. R dry eye/mild conjunctivitis  -Lubricating eyedrops started-medication was scheduled    LOS: 12 days A FACE TO FACE EVALUATION WAS PERFORMED  Clint Bolder P Ivin Rosenbloom 04/24/2023, 3:17 PM

## 2023-04-24 NOTE — Plan of Care (Signed)
  Problem: RH BOWEL ELIMINATION Goal: RH STG MANAGE BOWEL WITH ASSISTANCE Description: STG Manage Bowel with mod I Assistance. Outcome: Progressing   Problem: RH BLADDER ELIMINATION Goal: RH STG MANAGE BLADDER WITH ASSISTANCE Description: STG Manage Bladder With mod I Assistance Outcome: Progressing   Problem: RH SAFETY Goal: RH STG ADHERE TO SAFETY PRECAUTIONS W/ASSISTANCE/DEVICE Description: STG Adhere to Safety Precautions With cues Assistance/Device. Outcome: Progressing   Problem: RH KNOWLEDGE DEFICIT Goal: RH STG INCREASE KNOWLEDGE OF HYPERTENSION Description: Patient and wife will be able to manage HTN with medications and dietary modification using educational resources independently Outcome: Progressing

## 2023-04-25 DIAGNOSIS — I6381 Other cerebral infarction due to occlusion or stenosis of small artery: Secondary | ICD-10-CM | POA: Diagnosis not present

## 2023-04-25 DIAGNOSIS — E871 Hypo-osmolality and hyponatremia: Secondary | ICD-10-CM | POA: Insufficient documentation

## 2023-04-25 DIAGNOSIS — I10 Essential (primary) hypertension: Secondary | ICD-10-CM | POA: Diagnosis not present

## 2023-04-25 DIAGNOSIS — F05 Delirium due to known physiological condition: Secondary | ICD-10-CM | POA: Diagnosis not present

## 2023-04-25 LAB — CBC
HCT: 41.7 % (ref 39.0–52.0)
Hemoglobin: 14 g/dL (ref 13.0–17.0)
MCH: 30.9 pg (ref 26.0–34.0)
MCHC: 33.6 g/dL (ref 30.0–36.0)
MCV: 92.1 fL (ref 80.0–100.0)
Platelets: 505 10*3/uL — ABNORMAL HIGH (ref 150–400)
RBC: 4.53 MIL/uL (ref 4.22–5.81)
RDW: 11.8 % (ref 11.5–15.5)
WBC: 7.8 10*3/uL (ref 4.0–10.5)
nRBC: 0 % (ref 0.0–0.2)

## 2023-04-25 LAB — BASIC METABOLIC PANEL
Anion gap: 5 (ref 5–15)
BUN: 14 mg/dL (ref 8–23)
CO2: 28 mmol/L (ref 22–32)
Calcium: 9 mg/dL (ref 8.9–10.3)
Chloride: 99 mmol/L (ref 98–111)
Creatinine, Ser: 0.92 mg/dL (ref 0.61–1.24)
GFR, Estimated: >60 mL/min (ref >60)
Glucose, Bld: 89 mg/dL (ref 70–99)
Potassium: 3.9 mmol/L (ref 3.5–5.1)
Sodium: 132 mmol/L — ABNORMAL LOW (ref 135–145)

## 2023-04-25 LAB — GLUCOSE, CAPILLARY: Glucose-Capillary: 96 mg/dL (ref 70–99)

## 2023-04-25 MED ORDER — SENNOSIDES-DOCUSATE SODIUM 8.6-50 MG PO TABS
2.0000 | ORAL_TABLET | Freq: Two times a day (BID) | ORAL | Status: DC
  Administered 2023-04-25 – 2023-04-29 (×9): 2 via ORAL
  Filled 2023-04-25 (×9): qty 2

## 2023-04-25 NOTE — Progress Notes (Signed)
Physical Therapy Session Note  Patient Details  Name: Samuel Mata MRN: [id number] Date of Birth: [birth year]  Today's Date: 04/25/2023 PT Individual Time: 1032-1119 PT Individual Time Calculation (min): 47 min   Short Term Goals: Week 1:  PT Short Term Goal 1 (Week 1): Pt will perform supine<>sit with min A PT Short Term Goal 1 - Progress (Week 1): Met PT Short Term Goal 2 (Week 1): Pt will perform sit<>stands using LRAD with min A PT Short Term Goal 2 - Progress (Week 1): Met PT Short Term Goal 3 (Week 1): Pt will perform bed<>chair transfer using LRAD with min A PT Short Term Goal 3 - Progress (Week 1): Met PT Short Term Goal 4 (Week 1): Pt will ambulate at least 15ft using LRAD with min A PT Short Term Goal 4 - Progress (Week 1): Met Week 2:  PT Short Term Goal 1 (Week 2): = to LTGs based on ELOS  Skilled Therapeutic Interventions/Progress Updates:  Patient supine in bed on entrance to room. Patient alert and agreeable to PT session. Session not on schedule but pt remains agreeable.   Patient with no pain complaint at start of session.  Therapeutic Activity: Bed Mobility: Pt performed supine <> sit with ***. VC/ tc required for ***. Transfers: Pt performed sit<>stand and stand pivot transfers throughout session with ***. Provided verbal cues for***.  Gait Training:  Pt ambulated *** ft using *** with ***. Demonstrated ***. Provided vc/ tc for ***.  Neuromuscular Re-ed: NMR facilitated during session with focus on***. Pt guided in ***. NMR performed for improvements in motor control and coordination, balance, sequencing, judgement, and self confidence/ efficacy in performing all aspects of mobility at highest level of independence.   Patient *** at end of session with brakes locked, *** alarm set, and all needs within reach.   Therapy Documentation Precautions:  Precautions Precautions: Fall, Other (comment) Precaution Comments: HOH (hears better in R ear), sundowning, L  inattention, L vs posterior lean Restrictions Weight Bearing Restrictions: No  Pain:  No pain related this session.   Therapy/Group: Individual Therapy  Loel Dubonnet PT, DPT, [facility] 04/25/2023, 6:24 PM

## 2023-04-25 NOTE — Progress Notes (Signed)
PROGRESS NOTE   Subjective/Complaints  Pt oriented x 3 aware of d/c date  + constipation no abd pain   ROS-denies fever, chills, chest pain, shortness of breath, abdominal pain, nausea, vomiting, +hard of hearing  Objective:   No results found. Recent Labs    04/25/23 0550  WBC 7.8  HGB 14.0  HCT 41.7  PLT 505*     Recent Labs    04/25/23 0550  NA 132*  K 3.9  CL 99  CO2 28  GLUCOSE 89  BUN 14  CREATININE 0.92  CALCIUM 9.0      Intake/Output Summary (Last 24 hours) at 04/25/2023 0838 Last data filed at 04/24/2023 2026 Gross per 24 hour  Intake 360 ml  Output 300 ml  Net 60 ml        Physical Exam: Vital Signs Blood pressure (!) 150/70, pulse 62, temperature 97.8 F (36.6 C), temperature source Oral, resp. rate 18, height 5\' 7"  (1.702 m), weight 57.3 kg, SpO2 94%.   General: No acute distress ENT: hard of hearing Mood and affect are appropriate Heart: Regular rate and rhythm no rubs murmurs or extra sounds Lungs: Clear to auscultation, breathing unlabored, no rales or wheezes Abdomen: Positive bowel sounds, soft nontender to palpation, nondistended Extremities: No clubbing, cyanosis, or edema  Neurologic: Cranial nerves II through XII intact-hard of hearing, motor strength is 5/5 in bilateral deltoid, bicep, tricep, grip, hip flexor, knee extensors, ankle dorsiflexor and plantar flexor + dysdiadochokinesis with sup/pron LUE  Cerebellar intact FNF  Musculoskeletal: Full range of motion in all 4 extremities. No joint swelling   Assessment/Plan: 1. Functional deficits which require 3+ hours per day of interdisciplinary therapy in a comprehensive inpatient rehab setting. Physiatrist is providing close team supervision and 24 hour management of active medical problems listed below. Physiatrist and rehab team continue to assess barriers to discharge/monitor patient progress toward functional and  medical goals  Care Tool:  Bathing    Body parts bathed by patient: Right arm, Left arm, Chest, Face, Front perineal area, Abdomen, Right upper leg, Left upper leg, Right lower leg, Left lower leg, Buttocks   Body parts bathed by helper: Buttocks     Bathing assist Assist Level: Contact Guard/Touching assist     Upper Body Dressing/Undressing Upper body dressing   What is the patient wearing?: Pull over shirt    Upper body assist Assist Level: Set up assist    Lower Body Dressing/Undressing Lower body dressing      What is the patient wearing?: Underwear/pull up, Pants     Lower body assist Assist for lower body dressing: Contact Guard/Touching assist     Toileting Toileting    Toileting assist Assist for toileting: Contact Guard/Touching assist     Transfers Chair/bed transfer  Transfers assist     Chair/bed transfer assist level: Contact Guard/Touching assist Chair/bed transfer assistive device: Armrests, Walker   Locomotion Ambulation   Ambulation assist      Assist level: Minimal Assistance - Patient > 75% Assistive device: No Device Max distance: 150+   Walk 10 feet activity   Assist     Assist level: Minimal Assistance - Patient > 75% Assistive  device: No Device   Walk 50 feet activity   Assist    Assist level: Minimal Assistance - Patient > 75% Assistive device: No Device    Walk 150 feet activity   Assist Walk 150 feet activity did not occur: Safety/medical concerns  Assist level: Minimal Assistance - Patient > 75% Assistive device: No Device    Walk 10 feet on uneven surface  activity   Assist Walk 10 feet on uneven surfaces activity did not occur: Safety/medical concerns         Wheelchair     Assist Is the patient using a wheelchair?: Yes Type of Wheelchair: Manual    Wheelchair assist level: Dependent - Patient 0%      Wheelchair 50 feet with 2 turns activity    Assist        Assist Level:  Dependent - Patient 0%   Wheelchair 150 feet activity     Assist      Assist Level: Dependent - Patient 0%   Blood pressure (!) 150/70, pulse 62, temperature 97.8 F (36.6 C), temperature source Oral, resp. rate 18, height 5\' 7"  (1.702 m), weight 57.3 kg, SpO2 94%.  Medical Problem List and Plan: 1. 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  Antithrombotics: -DVT/anticoagulation:  Pharmaceutical: Lovenox             -antiplatelet therapy: DAPT x 3 weeks followed by Plavix alone.  3. Pain Management: tylenol prn.  4. Mood/Behavior/Sleep: LCSW to follow for evaluation and support.              -antipsychotic agents: N/A Somnolent states he did not sleep well check sleep graph , add melatonin, no sedating meds noted,  5. Neuropsych/cognition: This patient is? capable of making decisions on his own behalf, however is very HOH and maybe sundowning 6. Skin/Wound Care: Routine pressure relief measures.  7. Fluids/Electrolytes/Nutrition: Monitor I/O. BMET looks normal     Latest Ref Rng & Units 04/25/2023    5:50 AM 04/21/2023    6:24 AM 04/18/2023    6:23 AM  BMP  Glucose 70 - 99 mg/dL 89  92  96   BUN 8 - 23 mg/dL 14  21  18    Creatinine 0.61 - 1.24 mg/dL 1.61  0.96  0.45   Sodium 135 - 145 mmol/L 132  133  136   Potassium 3.5 - 5.1 mmol/L 3.9  4.1  4.4   Chloride 98 - 111 mmol/L 99  99  100   CO2 22 - 32 mmol/L 28  26  29    Calcium 8.9 - 10.3 mg/dL 9.0  8.9  9.6     8. HTN: Monitor BP TID--metoprolol resumed on 11/26, continue      Vitals:   04/24/23 2024 04/25/23 0440  BP:  (!) 150/70  Pulse: 68 62  Resp: 18 18  Temp: 97.7 F (36.5 C) 97.8 F (36.6 C)  SpO2: 93% 94%   12/9 controlled  on lower dose metoprolol 12.5 mg BID and amlodipine 5mg  daily 9.  Hyponatremia:improved , monitor weekly     Latest Ref Rng & Units  04/25/2023    5:50 AM 04/21/2023    6:24 AM 04/18/2023    6:23 AM  BMP  Glucose 70 - 99 mg/dL 89  92  96   BUN 8 - 23 mg/dL 14  21  18    Creatinine 0.61 - 1.24 mg/dL 1.61  0.96  0.45   Sodium 135 - 145 mmol/L 132  133  136   Potassium 3.5 - 5.1 mmol/L 3.9  4.1  4.4   Chloride 98 - 111 mmol/L 99  99  100   CO2 22 - 32 mmol/L 28  26  29    Calcium 8.9 - 10.3 mg/dL 9.0  8.9  9.6     10 COPD/Bronchiectasis/Hx of pulmonary Nocardiosis: Stable without meds, incentive spirometer ordered 11. Dyslipidemia: LDL-98-->continue Lipitor 80 mg daily 12. Non cardiac chest pain: Was started on metoprolol with prn NTG 11/15 by Dr. Melburn Popper.   13. GERD: continue Pepcid. 14. 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  15. OSA?: Sleep study recommended at discharge.  16. Sundowning resolved  17. R dry eye/mild conjunctivitis  -Lubricating eyedrops started-medication was scheduled  18.  Constipation schedule senna S   LOS: 13 days A FACE TO FACE EVALUATION WAS PERFORMED  Erick Colace 04/25/2023, 8:38 AM

## 2023-04-25 NOTE — Progress Notes (Signed)
Occupational Therapy Session Note  Patient Details  Name: Samuel Mata MRN: [id number] Date of Birth: 1930/11/29  Today's Date: 04/25/2023 OT Individual Time: 1119-1201 OT Individual Time Calculation (min): 42 min    Short Term Goals: Week 2:  OT Short Term Goal 1 (Week 2): STG=LTG d/t ELOS  Skilled Therapeutic Interventions/Progress Updates:     Pt handed off directly from PT to OT in therapy gym. Pt presenting to be in good spirits receptive to skilled OT session reporting 0/10 pain- OT offering intermittent rest breaks, repositioning, and therapeutic support to optimize participation in therapy session. [name] DTR, [name], present throughout session for informal family education session with emphasis on d/c planning, DME education, fall prevention education, and functional transfer education. Spent time at beginning of session reviewing photos of home set-up to identify options for DME to optimize [name] safety. Pt noted to have access to both a walk-in shower and a tub shower with single grabs bars positioned on wall in both showers. From chart review of OT session from this weekend, Pt practiced both a TTB transfer and walk-in shower transfer and presented to be most safe when using a TTB positioned in tub shower- Pt confirmed this as well during session stating he preferred to use the TTB. Engaged Pt in completing functional mobility to tub room using RW  for endurance training and to allow for practice during today's session with CGA provided for balance. Provided demonstration of TTB transfer technique, education on set-up/shower curtain management, education on grab bar placement, education on [facility], and overall BR safety with Pt and [name] DTR receptive to education. Engaged Pt in completing TTB transfer with CGA required for balance and mod verbal cues for technique d/t new learning experience. Pt receptive to education and motivated to utilize TTB at d/c- would benefit from additional education  and practice on completing transfer. During session Pt reporting need to use restroom. Pt transferred to standard toilet using RW and completed 3/3 toileting tasks following continent void CGA. Pt then stood at sink to wash hands with CGA provided for balance. Pt completed functional mobility back to his room >150 ft using RW with CGA provided for balance and mod verbal and tactile cues required for L foot placement and RW management as Pt began to drag his L foot or kick his RW with increased fatigue during functional mobility. Pt was left resting in recliner with call bell in reach, chair alarm on, and all needs met.    Therapy Documentation Precautions:  Precautions Precautions: Fall, Other (comment) Precaution Comments: HOH (hears better in R ear), sundowning, L inattention, L vs posterior lean Restrictions Weight Bearing Restrictions: No  Therapy/Group: Individual Therapy  [name] 04/25/2023, 7:59 AM

## 2023-04-25 NOTE — Discharge Summary (Signed)
Physician Discharge Summary  Patient ID: Samuel Mata MRN: [id number] DOB/AGE: 1930/10/07 89 y.o.  Admit date: 04/12/2023 Discharge date: 04/29/2023  Discharge Diagnoses:  Principal Problem:   Right thalamic stroke [facility]) Active Problems:   Bronchiectasis without complication (HCC)   Essential hypertension   Gastroesophageal reflux disease without esophagitis   OSA (obstructive sleep apnea)   Hyponatremia Thrombocytosis Hard of hearing COPD Dyslipidemia Constipation  Discharged Condition: stable  Significant Diagnostic Studies: CT HEAD WO CONTRAST ( )  Result Date: 04/12/2023 CLINICAL DATA:  Follow-up examination for stroke. EXAM: CT HEAD WITHOUT CONTRAST TECHNIQUE: Contiguous axial images were obtained from the base of the skull through the vertex without intravenous contrast. RADIATION DOSE REDUCTION: This exam was performed according to the departmental dose-optimization program which includes automated exposure control, adjustment of the mA and/or kV according to patient size and/or use of iterative reconstruction technique. COMPARISON:  Prior MRI and CT from 04/08/2023. FINDINGS: Brain: Generalized age-related cerebral atrophy with chronic small vessel ischemic disease. Small remote left cerebellar infarct noted. Previously identified right thalamic infarct again noted, relatively stable in size and distribution from prior. No significant regional mass effect. No hemorrhagic transformation. No other acute cortically based infarct. No acute intracranial hemorrhage. No mass lesion or midline shift. No hydrocephalus or extra-axial fluid collection. Vascular: No abnormal hyperdense vessel. Calcified atherosclerosis present at the skull base. Focal calcified plaque at the right M1 segment noted, stable. Skull: Scalp soft tissues and calvarium demonstrate no new finding. Sinuses/Orbits: Globes and orbital soft tissues within normal limits. Paranasal sinuses and mastoid air cells are  largely clear. Other: None. IMPRESSION: 1. Normal expected interval evolution of previously identified right thalamic infarct. No hemorrhagic transformation or significant regional mass effect. 2. No other new acute intracranial abnormality. 3. Underlying age-related cerebral atrophy with chronic small vessel ischemic disease. Small remote left cerebellar infarct. Electronically Signed   By: Rise Mu M.D.   On: 04/12/2023 02:09    Labs:  Basic Metabolic Panel:    Latest Ref Rng & Units 04/28/2023    6:44 AM 04/25/2023    5:50 AM 04/21/2023    6:24 AM  BMP  Glucose 70 - 99 mg/dL 409  89  92   BUN 8 - 23 mg/dL 21  14  21    Creatinine 0.61 - 1.24 mg/dL 8.11  9.14  7.82   Sodium 135 - 145 mmol/L 134  132  133   Potassium 3.5 - 5.1 mmol/L 3.9  3.9  4.1   Chloride 98 - 111 mmol/L 102  99  99   CO2 22 - 32 mmol/L 26  28  26    Calcium 8.9 - 10.3 mg/dL 8.9  9.0  8.9      CBC:    Latest Ref Rng & Units 04/28/2023    6:44 AM 04/25/2023    5:50 AM 04/21/2023    6:24 AM  CBC  WBC 4.0 - 10.5 K/uL 8.0  7.8  8.5   Hemoglobin 13.0 - 17.0 g/dL 95.6  21.3  08.6   Hematocrit 39.0 - 52.0 % 40.4  41.7  39.4   Platelets 150 - 400 K/uL 432  505  475      CBG: Recent Labs  Lab 04/28/23 0736 04/28/23 1151 04/28/23 1632 04/28/23 2139 04/29/23 0638  GLUCAP 74 82 98 107* 75     Brief HPI:   Samuel Mata is a 89 y.o. right-handed male with history of HTN, COPD, left frontoparietal stroke who was admitted on 04/07/2023 with reports  of progressive LLE weakness x 24 hours as well as family reports of gradual cognitive decline.  CTA head was negative for LVO.  MRI brain done revealing 9 mm acute ischemic right thalamic capsular infarct with moderate chronic microvascular disease.  Loop recorder was interrogated and showed no evidence of A-fib.  Dr. Pearlean Brownie felt the stroke was due to small vessel disease and recommended DAPT x 3 weeks followed by Plavix alone.  He did have some weakness in LUE on  11/25 with repeat CT head showing evolution of stroke and was negative for bleed.  Therapy was working with patient who was limited by left-sided neglect with left lean, balance deficits and flexed posture.  He is requiring min to mod assist overall and was independent prior to admission.  CIR was recommended due to functional decline.   Hospital Course: Samuel Mata was admitted to rehab 04/12/2023 for inpatient therapies to consist of PT, ST and OT at least three hours five days a week. Past admission physiatrist, therapy team and rehab RN have worked together to provide customized collaborative inpatient rehab.  Blood pressures were monitored on TID basis and was noted to be labile therefore metoprolol was increased to 25 mg twice daily.  Instructed to check BP and HR daily and told hold dose if pulse < 65 or SBP < 110.  He continues on heart healthy diet with reasonable intake.   His electrolytes have been monitored with serial checks showing recurrence of hyponatremia to 132 on 12/09.  Follow-up labs on 12/12 with improvement to 134.  GERD symptoms have been managed on Pepcid.  He is tolerating DAPT without side effects with follow-up CBC showing H&H and platelets to be stable.  Thrombocytosis fluctuating down to 432,000 on 12/12. He was maintained on aspirin through 12/13 and is to continue Plavix indefinitely or as advised by neurology.   Melatonin was added to help with sleep-wake disruption.  Sundowning has resolved with improvement in sleep cycle.  Right dry eyes mild conjunctivitis noted and lubricating eyedrops was scheduled to help manage symptoms.  Bowel program has been augmented to help manage constipation.  Respiratory status has been stable and pulmonary hygiene with use of incentive spirometer was encouraged.    Rehab course: During the patient's stay in rehab weekly team conferences were held to monitor patient's progress, set goals and discuss barriers to discharge. At admission,  patient required mod assist with basic ADL task and with mobility. He exhibited delay in processing with mild deficits in memory and Social research officer, government. He has had improvement in activity tolerance, balance, postural control as well as ability to compensate for deficits. He has had improvement in functional use LUE  and LLE as well as improvement in awareness.     Discharge disposition: 06-Home-Health Care Svc     Diet: heart healthy  Special Instructions: No driving, alcohol consumption or tobacco use.  Sleep study as outpatient recommend to rule out OSA.  Discharge Instructions     Ambulatory referral to Neurology   Complete by: As directed    An appointment is requested in approximately: 4 weeks   Ambulatory referral to Physical Medicine Rehab   Complete by: As directed    Hospital follow up   Discharge patient   Complete by: As directed    Discharge disposition: 06-Home-Health Care Svc   Discharge patient date: 04/29/2023      Allergies as of 04/29/2023       Reactions   Codeine    Unknown  reaction        Medication List     STOP taking these medications    aspirin EC 81 MG tablet       TAKE these medications    acetaminophen 325 MG tablet Commonly known as: TYLENOL Take 650 mg by mouth as needed for headache.   amLODipine 5 MG tablet Commonly known as: NORVASC Take 1 tablet (5 mg total) by mouth daily.   CLEAR EYES OP Place 1 drop into both eyes at bedtime.   clopidogrel 75 MG tablet Commonly known as: PLAVIX Take 1 tablet (75 mg total) by mouth daily.   famotidine 20 MG tablet Commonly known as: PEPCID Take 1 tablet (20 mg total) by mouth daily.   fluticasone 50 MCG/ACT nasal spray Commonly known as: FLONASE Place 2 sprays into both nostrils daily.   melatonin 3 MG Tabs tablet Take 1 tablet (3 mg total) by mouth at bedtime.   metoprolol tartrate 25 MG tablet Commonly known as: LOPRESSOR Take 1 tablet (25 mg total) by mouth 2  (two) times daily.   nitroGLYCERIN 0.4 MG SL tablet Commonly known as: NITROSTAT Place 1 tablet (0.4 mg total) under the tongue every 5 (five) minutes as needed for chest pain.   polyethylene glycol 17 g packet Commonly known as: MIRALAX / GLYCOLAX Take 17 g by mouth daily.   polyvinyl alcohol 1.4 % ophthalmic solution Commonly known as: LIQUIFILM TEARS Place 2 drops into the right eye 4 (four) times daily.   rosuvastatin 20 MG tablet Commonly known as: CRESTOR Take 1 tablet (20 mg total) by mouth daily with supper. What changed: when to take this   senna-docusate 8.6-50 MG tablet Commonly known as: Senokot-S Take 2 tablets by mouth 2 (two) times daily.   sodium chloride 0.65 % nasal spray Commonly known as: OCEAN Place 1 spray into the nose as needed for congestion.   tamsulosin 0.4 MG Caps capsule Commonly known as: FLOMAX Take 1 capsule (0.4 mg total) by mouth daily after supper. What changed: when to take this   TUMS PO Take 1 tablet by mouth daily as needed (for heartburn).        Follow-up Information     [facility] Follow up.   Specialty: Family Medicine Why: Call in 1-2 days for post hospital follow up Contact information: [address] [place] [address] [phone]         Erick Colace, MD Follow up.   Specialty: Physical Medicine and Rehabilitation Why: office will call you with follow up appointment Contact information: [address] [place] [address] [phone]         [facility] Follow up.   Why: office will call you with follow up appointment Contact information: [address]     [address] [place] [address] [phone]        Nahser, Deloris Ping, MD. Call.   Specialty: Cardiology Why: As needed Contact information: [address] [place] [address] [phone]                  Signed: Milinda Antis 04/29/2023, 8:48 AM

## 2023-04-25 NOTE — Progress Notes (Signed)
Speech Language Pathology Daily Session Note  Patient Details  Name: Samuel Mata MRN: [id number] Date of Birth: 06/14/1930  Today's Date: 04/25/2023 SLP Individual Time: 5732-2025 SLP Individual Time Calculation (min): 43 min  Short Term Goals: Week 2: SLP Short Term Goal 1 (Week 2): STG = LTG due to ELOS  Skilled Therapeutic Interventions:   Pt seen for skilled SLP intervention to address cognitive goals. Pt completed moderate-level deductive reasoning puzzle with min-mod verbal cues to complete puzzle with 100% accuracy. He was able to correctly answer all direct clues independently; though was challenged with more abstract clues.  Pt completed visual memory task with 100% accuracy independently. Pt left sitting upright in bed with bed alarm activated and call bell in reach. Continue SLP PoC.   Pain Pain Assessment Pain Scale: 0-10 Pain Score: 0-No pain  Therapy/Group: Individual Therapy  Ellery Plunk 04/25/2023, 11:57 AM

## 2023-04-26 LAB — GLUCOSE, CAPILLARY
Glucose-Capillary: 79 mg/dL (ref 70–99)
Glucose-Capillary: 85 mg/dL (ref 70–99)
Glucose-Capillary: 91 mg/dL (ref 70–99)
Glucose-Capillary: 103 mg/dL — ABNORMAL HIGH (ref 70–99)

## 2023-04-26 NOTE — Progress Notes (Signed)
Speech Language Pathology Daily Session Note  Patient Details  Name: Samuel Mata MRN: [id number] Date of Birth: 1930/12/19  Today's Date: 04/26/2023 SLP Individual Time: 1400-1430 SLP Individual Time Calculation (min): 30 min  Short Term Goals: Week 2: SLP Short Term Goal 1 (Week 2): STG = LTG due to ELOS  Skilled Therapeutic Interventions: Skilled therapy session focused on cognitive goals. SLP provided supervision A during completion of abstract card game. Activity required use of problem solving and memory strategies. Patient able to recall and utilize instructions to participate in game given supervision A. At end of session, patient recalled activity completed with ST on Saturday demonstrating functional progress in memory. Patient left in chair with alarm set and call bell in reach. Continue POC.   Pain Pain Assessment Pain Scale: 0-10 Pain Score: 0-No pain  Therapy/Group: Individual Therapy   [name] M.A., CF-SLP 04/26/2023, 12:44 PM

## 2023-04-26 NOTE — Progress Notes (Signed)
Patient ID: Samuel Mata, male   DOB: Oct 11, 1930, 89 y.o.   MRN: [id number]  RW and TTB ordered through [employer].

## 2023-04-26 NOTE — Progress Notes (Signed)
Occupational Therapy Session Note  Patient Details  Name: Samuel Mata MRN: [id number] Date of Birth: 1931-03-02  Today's Date: 04/26/2023 OT Individual Time: 0901-1000 OT Individual Time Calculation (min): 59 min    Short Term Goals: Week 2:  OT Short Term Goal 1 (Week 2): STG=LTG d/t ELOS  Skilled Therapeutic Interventions/Progress Updates:     Pt received sitting up in bed presenting to be in good spirits receptive to skilled OT session reporting 0/10 pain- OT offering intermittent rest breaks, repositioning, and therapeutic support to optimize participation in therapy session. Pt requesting to take shower this AM. Focus this session BADL retraining in context of shower with emphasis on energy conservation, safety awareness, and functional cognition.   Pt requesting to use BR prior to shower. Pt transitioned to EOB with supervision HOB elevated. Sit > stand from EOB to RW CGA with Pt able to recall correct hand placement. Pt completed functional mobility to BR using RW and transferred to [facility] placed over toilet with CGA and min verbal cues required for L step length. Pt able to perform 3/3 toileting tasks with close supervision this session +increased time and mod verbal cues for safety. Pt completed ambulatory transfer to walk-in shower with light min A for balance and RW management. Doffed clothes in sitting position with supervision.   Once seated in shower, Pt able to complete UB bathing with close supervision utilizing his L UE at a non-dominant level and appropriately attending to L side of body during bathing tasks. Pt able to lift B LEs to wash feet and lower portion of legs and stood while holding grab bar to wash bottom CGA. Pt dried self in seated position supervision and stood to dry bottom CGA.   Functional mobility from BR > wc in bed room using RW CGA with min verbal cues for L foot placement. Pt able to donn clean shirt and sweat shirt with supervision while seated in wc. Pt  weaved B LEs into underwear/pants by lifting B LEs and stood with CGA to bring pants to waist while holding RW. Pt with mild posterior LOB when standing to bring pants to waist- able to correct with CGA. Recalled correct technique for sit <> stands during BADLs with min questioning cues.   Positioned Pt in wc at sink for grooming/hygiene tasks for energy conservation. Pt able to wash face, shave face, groom hair, and complete oral care with supervision manipulating small items and bottles without dropping and attending fully to L side of face with min verbal cues.   Pt was left resting in wc with call bell in reach, seatbelt alarm on, and all needs met.    Therapy Documentation Precautions:  Precautions Precautions: Fall, Other (comment) Precaution Comments: HOH (hears better in R ear), sundowning, L inattention, L vs posterior lean Restrictions Weight Bearing Restrictions: No  Therapy/Group: Individual Therapy  [name] 04/26/2023, 7:56 AM

## 2023-04-26 NOTE — Progress Notes (Signed)
PROGRESS NOTE   Subjective/Complaints  Had BM yesterday No pain c/os Pt feels like staff has been very helpful  ROS-denies fever, chills, chest pain, shortness of breath, abdominal pain, nausea, vomiting, +hard of hearing  Objective:   No results found. Recent Labs    04/25/23 0550  WBC 7.8  HGB 14.0  HCT 41.7  PLT 505*     Recent Labs    04/25/23 0550  NA 132*  K 3.9  CL 99  CO2 28  GLUCOSE 89  BUN 14  CREATININE 0.92  CALCIUM 9.0      Intake/Output Summary (Last 24 hours) at 04/26/2023 1136 Last data filed at 04/26/2023 1000 Gross per 24 hour  Intake 558 ml  Output 1100 ml  Net -542 ml        Physical Exam: Vital Signs Blood pressure (!) 151/74, pulse 67, temperature 97.8 F (36.6 C), resp. rate 18, height 5\' 7"  (1.702 m), weight 57.3 kg, SpO2 94%.   General: No acute distress ENT: hard of hearing Mood and affect are appropriate Heart: Regular rate and rhythm no rubs murmurs or extra sounds Lungs: Clear to auscultation, breathing unlabored, no rales or wheezes Abdomen: Positive bowel sounds, soft nontender to palpation, nondistended Extremities: No clubbing, cyanosis, or edema  Neurologic: Cranial nerves II through XII intact-hard of hearing, motor strength is 5/5 in bilateral deltoid, bicep, tricep, grip, hip flexor, knee extensors, ankle dorsiflexor and plantar flexor + dysdiadochokinesis with sup/pron LUE  Cerebellar intact FNF  Musculoskeletal: Full range of motion in all 4 extremities. No joint swelling   Assessment/Plan: 1. Functional deficits which require 3+ hours per day of interdisciplinary therapy in a comprehensive inpatient rehab setting. Physiatrist is providing close team supervision and 24 hour management of active medical problems listed below. Physiatrist and rehab team continue to assess barriers to discharge/monitor patient progress toward functional and medical  goals  Care Tool:  Bathing    Body parts bathed by patient: Right arm, Left arm, Chest, Face, Front perineal area, Abdomen, Right upper leg, Left upper leg, Right lower leg, Left lower leg, Buttocks   Body parts bathed by helper: Buttocks     Bathing assist Assist Level: Contact Guard/Touching assist     Upper Body Dressing/Undressing Upper body dressing   What is the patient wearing?: Pull over shirt    Upper body assist Assist Level: Set up assist    Lower Body Dressing/Undressing Lower body dressing      What is the patient wearing?: Underwear/pull up, Pants     Lower body assist Assist for lower body dressing: Contact Guard/Touching assist     Toileting Toileting    Toileting assist Assist for toileting: Contact Guard/Touching assist     Transfers Chair/bed transfer  Transfers assist     Chair/bed transfer assist level: Contact Guard/Touching assist Chair/bed transfer assistive device: Armrests, Walker   Locomotion Ambulation   Ambulation assist      Assist level: Minimal Assistance - Patient > 75% Assistive device: No Device Max distance: 150+   Walk 10 feet activity   Assist     Assist level: Minimal Assistance - Patient > 75% Assistive device: No Device  Walk 50 feet activity   Assist    Assist level: Minimal Assistance - Patient > 75% Assistive device: No Device    Walk 150 feet activity   Assist Walk 150 feet activity did not occur: Safety/medical concerns  Assist level: Minimal Assistance - Patient > 75% Assistive device: No Device    Walk 10 feet on uneven surface  activity   Assist Walk 10 feet on uneven surfaces activity did not occur: Safety/medical concerns         Wheelchair     Assist Is the patient using a wheelchair?: Yes Type of Wheelchair: Manual    Wheelchair assist level: Dependent - Patient 0%      Wheelchair 50 feet with 2 turns activity    Assist        Assist Level:  Dependent - Patient 0%   Wheelchair 150 feet activity     Assist      Assist Level: Dependent - Patient 0%   Blood pressure (!) 151/74, pulse 67, temperature 97.8 F (36.6 C), resp. rate 18, height 5\' 7"  (1.702 m), weight 57.3 kg, SpO2 94%.  Medical Problem List and Plan: 1. 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  Antithrombotics: -DVT/anticoagulation:  Pharmaceutical: Lovenox             -antiplatelet therapy: DAPT x 3 weeks followed by Plavix alone.  3. Pain Management: tylenol prn.  4. Mood/Behavior/Sleep: LCSW to follow for evaluation and support.              -antipsychotic agents: N/A Somnolent states he did not sleep well check sleep graph , add melatonin, no sedating meds noted,  5. Neuropsych/cognition: This patient is? capable of making decisions on his own behalf, however is very HOH and maybe sundowning 6. Skin/Wound Care: Routine pressure relief measures.  7. Fluids/Electrolytes/Nutrition: Monitor I/O. BMET looks normal     Latest Ref Rng & Units 04/25/2023    5:50 AM 04/21/2023    6:24 AM 04/18/2023    6:23 AM  BMP  Glucose 70 - 99 mg/dL 89  92  96   BUN 8 - 23 mg/dL 14  21  18    Creatinine 0.61 - 1.24 mg/dL 1.61  0.96  0.45   Sodium 135 - 145 mmol/L 132  133  136   Potassium 3.5 - 5.1 mmol/L 3.9  4.1  4.4   Chloride 98 - 111 mmol/L 99  99  100   CO2 22 - 32 mmol/L 28  26  29    Calcium 8.9 - 10.3 mg/dL 9.0  8.9  9.6     8. HTN: Monitor BP TID--metoprolol resumed on 11/26, continue      Vitals:   04/25/23 2003 04/26/23 0544  BP: 129/69 (!) 151/74  Pulse: 71 67  Resp: 18 18  Temp: 97.8 F (36.6 C) 97.8 F (36.6 C)  SpO2: 92% 94%   12/10 some lability  on lower dose metoprolol 12.5 mg BID and amlodipine 5mg  daily 9. 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 Dyslipidemia: LDL-98-->continue Lipitor 80 mg daily 12. Non cardiac chest pain: Was started on metoprolol with prn NTG 11/15 by Dr. Melburn Popper.   13. GERD: continue Pepcid. 14. 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  15. OSA?: Sleep study recommended at discharge.  16. Sundowning resolved  17. R dry eye/mild conjunctivitis- resolved  -Lubricating eyedrops started-medication was scheduled  18.  Constipation resolved schedule senna S   LOS: 14 days A FACE TO FACE EVALUATION WAS PERFORMED  Samuel Mata 04/26/2023, 11:36 AM

## 2023-04-26 NOTE — Progress Notes (Addendum)
Patient ID: Samuel Mata, male   DOB: 1930/05/25, 89 y.o.   MRN: [id number]  [facility] referral submitted to [facility].   Patient approved for PT/Ot /Aide.

## 2023-04-26 NOTE — Progress Notes (Signed)
Physical Therapy Session Note  Patient Details  Name: Samuel Mata MRN: [id number] Date of Birth: 31-Mar-1931  Today's Date: 04/26/2023 PT Individual Time: 1884-1660 and 6301-6010 PT Individual Time Calculation (min): 45 min and 42 min   Short Term Goals: Week 2:  PT Short Term Goal 1 (Week 2): = to LTGs based on ELOS  Skilled Therapeutic Interventions/Progress Updates:    Session 1: Pt received sitting in w/c reporting he is tired today, but despite this is agreeable to therapy session. Sit>stands from seats with armrests to RW with CGA/close SBA for safety - does require verbal cuing to remember to push up from seat, but otherwise remembers to scoot forward, bring feet back underneath BOS, and lean forward.  Gait training ~140ft to main therapy gym using RW with close SBA/CGA for safety - kicks back L leg of RW ~3-5x during this gait bout due to his body being misaligned with RW, but overall achieves adequate gait speed with reciprocal stepping pattern.   Reviewed his home entry layout with pictures provided by his wife. Pt open to recommendation to use RW to step up/down on/off the step for home entry - therapist provided visual demonstration of proper technique.  Performed stepped up/down on/off 5" curb step using RW x 4 reps with pt demonstrating excellent technique with close SBA for safety. Pt reports very confident in his ability to do this at home. Does best leading with L LE when stepping up and either LE when stepping down to improve his balance/midline orientation.  Throughout session pt continues to demo L lean when not using BUE support on RW requiring up to heavy min A to maintain balance.  Dynamic gait training, no AD, working on visual scanning, turning, and reaching to pick up 8 cones from various height surfaces - CGA for steadying and pt with a few instances of minor LOB but able to utilize balance strategies to maintain upright.   Dynamic gait training ~8ft while  carrying 5lb tidal tank to cause internal perturbations with CGA for steadying - continues to have some minor balance instability but utilizes balance recovery strategies without increased assist, but has to move at decreased speed with smaller step lengths to maintain balance.  Pt reports these dynamic gait activities have caused him to feel minor "dizziness" symptoms - pt with difficulty elaborating on them further but doesn't report worsening of symptoms with transition from sit>stand.  Dynamic gait training of stepping over 5x 6" height hurdles, requires L HHA providing min A for balance then can perform step-to pattern successfully ~90% of the time for 1 round.   Gait training back to room using RW as described above, but progressed to close SBA for safety.   Pt left seated in w/c with needs in reach and seat belt alarm on.   Session 2: Pt received sitting in w/c with his daughter, [name], present and pt agreeable to therapy session. [name] daughter reporting she has ordered all the necessary equipment and household items to promote pt's safety and increased independence with daily mobility and ADL tasks at home. She reports she has hired caregivers for pt during his high activity times at home and that her sister is planning to stay 3 days/nights with pt at D/C. [name] DTR has done an excellent job of preparing pt's home environment for his safety. Pt and family in agreement with recommendation for follow-up HHPT.   Sit>stand w/c>RW with pt forgetting to push-up from armrests and instead places hands on RW to  come to stand - pt unable to recognize his error without question cuing - completes with SBA for balance safety.   Gait training ~133ft to main therapy gym using RW with close SBA for safety - cuing for increased gait speed to target cardiovascular endurance and increased HR to promote motor learning - only ~1x instances of kicking L back leg of RW.  Dynamic balance activity focusing on  perturbation retraining of ball toss into rebounder- performed with wide based stance progressed to normal width stance and then slight stagger stance with each foot forward - pt maintains balance with CGA for safety/steadying throughout - continues to have greatest challenge with L foot forward and normal BOS due to L posterior lean bias.  Dynamic gait training using 5x 6" height hurdles including:  - forward step-to pattern leading with L LE down/back x1 with L HHA  - side stepping down/back x1 with L HHA Therapist providing heavy min A for balance via HHA for increased pt confidence and safety due to pt continuing to have difficulty sustaining R weight shift to allow him to lift L LE adequately to step over this height of an object.  Stair navigation training ascending/descending 4 steps (6" height) using B HRs with CGA for safety - ascending/descending with reciprocal stepping pattern and good control.   Gait training ~132ft back to his room, no UE support, with CGA for steadying - continues to have excessively wide BOS with B LEs externally rotated and arms abducted away from his body in a guarded position due to balance instability.   Therapist educated pt/family on local stroke support group and signed pt's DTR up to receive information about the group.  Pt left seated in w/c with needs in reach and seat belt alarm on.     Therapy Documentation Precautions:  Precautions Precautions: Fall, Other (comment) Precaution Comments: HOH (hears better in R ear), sundowning, L inattention, L vs posterior lean Restrictions Weight Bearing Restrictions: No   Pain:  Session 1: No reports of pain throughout session.  Session 2: No reports of pain throughout session.  Therapy/Group: Individual Therapy  Ginny Forth , PT, DPT, NCS, CSRS 04/26/2023, 8:03 AM

## 2023-04-26 NOTE — Plan of Care (Signed)
  Problem: Consults Goal: RH STROKE PATIENT EDUCATION Description: See Patient Education module for education specifics  Outcome: Progressing   Problem: RH BOWEL ELIMINATION Goal: RH STG MANAGE BOWEL WITH ASSISTANCE Description: STG Manage Bowel with mod I Assistance. Outcome: Progressing Goal: RH STG MANAGE BOWEL W/MEDICATION W/ASSISTANCE Description: STG Manage Bowel with Medication with mod I Assistance. Outcome: Progressing   Problem: RH BLADDER ELIMINATION Goal: RH STG MANAGE BLADDER WITH ASSISTANCE Description: STG Manage Bladder With mod I Assistance Outcome: Progressing Goal: RH STG MANAGE BLADDER WITH MEDICATION WITH ASSISTANCE Description: STG Manage Bladder With Medication With mod I Assistance. Outcome: Progressing   Problem: RH SAFETY Goal: RH STG ADHERE TO SAFETY PRECAUTIONS W/ASSISTANCE/DEVICE Description: STG Adhere to Safety Precautions With cues Assistance/Device. Outcome: Progressing   Problem: RH PAIN MANAGEMENT Goal: RH STG PAIN MANAGED AT OR BELOW [name] PAIN GOAL Description: < 4 with prns Outcome: Progressing   Problem: RH KNOWLEDGE DEFICIT Goal: RH STG INCREASE KNOWLEDGE OF HYPERTENSION Description: Patient and wife will be able to manage HTN with medications and dietary modification using educational resources independently Outcome: Progressing Goal: RH STG INCREASE KNOWLEGDE OF HYPERLIPIDEMIA Description: Patient and wife will be able to manage HLD with medications and dietary modification using educational resources independently Outcome: Progressing Goal: RH STG INCREASE KNOWLEDGE OF STROKE PROPHYLAXIS Description: Patient and wife will be able to manage secondary risks with medications and dietary modification using educational resources independently Outcome: Progressing

## 2023-04-27 LAB — GLUCOSE, CAPILLARY
Glucose-Capillary: 73 mg/dL (ref 70–99)
Glucose-Capillary: 93 mg/dL (ref 70–99)
Glucose-Capillary: 125 mg/dL — ABNORMAL HIGH (ref 70–99)
Glucose-Capillary: 95 mg/dL (ref 70–99)

## 2023-04-27 MED ORDER — METOPROLOL TARTRATE 25 MG PO TABS
25.0000 mg | ORAL_TABLET | Freq: Two times a day (BID) | ORAL | Status: DC
Start: 2023-04-27 — End: 2023-04-29
  Administered 2023-04-28 – 2023-04-29 (×2): 25 mg via ORAL
  Filled 2023-04-27 (×4): qty 1

## 2023-04-27 NOTE — Progress Notes (Signed)
Patient ID: Samuel Mata, male   DOB: [birth year], 89 y.o.   MRN: [id number]  Team Conference Report to Patient/Family  Team Conference discussion was reviewed with the patient and caregiver, including goals, any changes in plan of care and target discharge date.  Patient and caregiver express understanding and are in agreement.  The patient has a target discharge date of 04/29/23  SW met with patient and daughter in room to discuss team conference updates. Patient has received DME in room and daughter plans to take home today. Daughter has ordered additional items believed needed.  Pt established with [facility] for Aide/PT /OT. [facility] resources provided previously. No additional questions or concerns currently.   [name] 04/27/2023, 2:31 PM

## 2023-04-27 NOTE — Progress Notes (Signed)
Physical Therapy Session Note  Patient Details  Name: Samuel Mata MRN: [id number] Date of Birth: 20-Dec-1930  Today's Date: 04/27/2023 PT Individual Time: 0918-1030 PT Individual Time Calculation (min): 72 min   Short Term Goals: Week 2:  PT Short Term Goal 1 (Week 2): = to LTGs based on ELOS  Skilled Therapeutic Interventions/Progress Updates:    Pt received asleep, supine in bed but easily awakens and agreeable to therapy session. Supine>sitting L EOB, HOB flat but using bedrail per home set-up, with distant supervision progressing to mod-I. Vitals sitting EOB: BP 112/61 (MAP 78), HR 74bpm   Sitting EOB with supervision while donning socks and shoes set-up assist - pt with L posterior LOB frequently during donning socks, but able to use L UE support to remain upright without physical assist. Sit>stand EOB>RW with pt requiring 2nd attempt to come to standing and maintain balance due to posterior LOB on the 1st attempt resulting him in returning to sitting on EOB - therapist providing verbal cuing to remember to push up from seat and lean forward when rising.  Gait training ~145ft to main therapy gym using RW - pt with significantly slower gait speed this morning, rounded shoulders with slight forward management of AD, and continued slightly wider BOS with slight decreased L LE foot clearance compared to R - continues to be slightly off-set in RW causing him to occasionally kick back L leg of RW. Verbal and tactile cuing throughout for improved gait mechanics and AD management.   Patient participated in [facility] and demonstrates increased fall risk as noted by score of  20/56.  (<36= high risk for falls, close to 100%; 37-45 significant >80%; 46-51 moderate >50%; 52-55 lower >25%). Pt with significant increased fall risk, continues to have L posterior LOB, and continues to require wide BOS to maintain standing balance. During turning 360degrees pt with L posterior lean/LOB but 1x was  able to take small shuffled steps and regain balance without physical assistance (close guarding for pf safety).  Repeated the following Berg Balance Test items targeting pt's balance:  - Alternating foot taps changed to repeated L LE foot taps to 6" step x5 reps then x4 reps providing mirror feedback and improved to light min A!! Carried over for 2 reps without mirror support. When using the mirror pt improves upright posture and R weight shift  - 360 degree turn again towards opposite direction with pt continuing to require min A for balance due to L posterior lean  Pt reporting increased fatigue today and requires frequent seated rest breaks. Anticipate may be due to pt taking limited naps yesterday.   Gait training ~181ft back to room using RW as described above with therapist continuing to provide cuing for improved gait mechanics and AD management.  Pt left seated in recliner with needs in reach and chair alarm on.   Therapy Documentation Precautions:  Precautions Precautions: Fall, Other (comment) Precaution Comments: HOH (hears better in R ear), sundowning, L inattention, L vs posterior lean Restrictions Weight Bearing Restrictions: No   Pain:  No reports of pain throughout session.  Balance: Balance Balance Assessed: Yes Standardized Balance Assessment Standardized Balance Assessment: Berg Balance Test Berg Balance Test Sit to Stand: Able to stand  independently using hands (comes to stand with wide BOS) Standing Unsupported: Able to stand 2 minutes with supervision (stands with wide BOS) Sitting with Back Unsupported but Feet Supported on Floor or Stool: Able to sit safely and securely 2 minutes Stand to Sit:  Uses backs of legs against chair to control descent Transfers: Able to transfer with verbal cueing and /or supervision Standing Unsupported with Eyes Closed: Able to stand 10 seconds with supervision Standing Ubsupported with Feet Together: Needs help to attain  position and unable to hold for 15 seconds From Standing, Reach Forward with Outstretched Arm: Reaches forward but needs supervision From Standing Position, Pick up Object from Floor: Unable to pick up and needs supervision (when reaches down too far has posterior LOB and sits back down on mat, but can initiate it and return to standing with close supervision) From Standing Position, Turn to Look Behind Over each Shoulder: Needs supervision when turning Turn 360 Degrees: Needs assistance while turning Standing Unsupported, Alternately Place Feet on Step/Stool: Needs assistance to keep from falling or unable to try Standing Unsupported, One Foot in Front: Loses balance while stepping or standing Standing on One Leg: Unable to try or needs assist to prevent fall Total Score: 20    Therapy/Group: Individual Therapy  Ginny Forth , PT, DPT, NCS, CSRS 04/27/2023, 7:53 AM

## 2023-04-27 NOTE — Progress Notes (Signed)
Occupational Therapy Session Note  Patient Details  Name: Samuel Mata MRN: [id number] Date of Birth: 26-Jul-1930  Today's Date: 04/27/2023 OT Individual Time: 4098-1191 OT Individual Time Calculation (min): 73 min    Short Term Goals: Week 2:  OT Short Term Goal 1 (Week 2): STG=LTG d/t ELOS  Skilled Therapeutic Interventions/Progress Updates:     Pt received sitting up in recliner presenting to be in good spirits receptive to skilled OT session reporting 0/10 pain- OT offering intermittent rest breaks, repositioning, and therapeutic support to optimize participation in therapy session. Focus this session BADL retraining, functional transfer training, activity tolerance and safety awareness.   Pt reporting he would like to change clothes d/t spilling urinal on himself prior to session and requesting to to use BR at beginning of therapy session. Engaged Pt in completing functional mobility to BR using RW with close supervision required for safety. Pt transferred to [facility] placed over toilet and doffed pants from waist supervision. Increased time provided on toilet d/t need for BM- continent B&B documented in flowsheets. Pt able to complete 3/3 toileting tasks with close supervision this session no LOB noted- stood using RW for peri-care and  clothing management. Pt weaved B LEs into pants while seated on BSC and stood to bring pants to wast using RW for balance close supervision.   Pt completed functional mobility to sink close supervision and stood to wash hands and brush teeth for increased balance and activity tolerance challenge. Close supervision provided for safety with single UE support on RW during ADLs. Seated rest break provided following.   Transported Pt total A to tub room in [place] for energy conservation and time management. Re-educated Pt on TTB transfer technique to support errorless learning and improved carryover at home. Pt completed ambulatory transfer to TTB in tub shower using RW  with CGA provided for safety and min verbal cues for safety and awareness. Pt with improved RW management and recall of hand placement this trial compared to previous sessions.   Transported pt total A to therapy gym in [place] for time management and energy conservation. Engaged Pt in dynamic standing balance dual tasking Christmas tree decorating activity using RW for balance. Incorporated functional reaching and dynamic standing balance into activity to simulate skills require for BADLs. Pt able to tolerate standing ~8 minutes during activity with CGA to light min A required when reaching inferiorly. Pt noted to be fatiguing during activity with increased verbal cues required for safety to pause and regain balance prior to attempting to donn next ornament.   Transported Pt back to room total A in [place]. Short distance functional mobility to recliner using RW CGA. Pt was left resting in recliner with call bell in reach, chair alarm on, and all needs met.    Therapy Documentation Precautions:  Precautions Precautions: Fall, Other (comment) Precaution Comments: HOH (hears better in R ear), sundowning, L inattention, L vs posterior lean Restrictions Weight Bearing Restrictions: No   Therapy/Group: Individual Therapy  [name] 04/27/2023, 1:04 PM

## 2023-04-27 NOTE — Progress Notes (Signed)
PROGRESS NOTE   Subjective/Complaints  No issues overnite , daughter is visiting, family ed  ROS-denies fever, chills, chest pain, shortness of breath, abdominal pain, nausea, vomiting, +hard of hearing  Objective:   No results found. Recent Labs    04/25/23 0550  WBC 7.8  HGB 14.0  HCT 41.7  PLT 505*     Recent Labs    04/25/23 0550  NA 132*  K 3.9  CL 99  CO2 28  GLUCOSE 89  BUN 14  CREATININE 0.92  CALCIUM 9.0      Intake/Output Summary (Last 24 hours) at 04/27/2023 0944 Last data filed at 04/27/2023 0814 Gross per 24 hour  Intake 720 ml  Output 500 ml  Net 220 ml        Physical Exam: Vital Signs Blood pressure (!) 150/84, pulse 77, temperature (!) 97.4 F (36.3 C), temperature source Oral, resp. rate 17, height 5\' 7"  (1.702 m), weight 56 kg, SpO2 96%.   General: No acute distress ENT: hard of hearing Mood and affect are appropriate Heart: Regular rate and rhythm no rubs murmurs or extra sounds Lungs: Clear to auscultation, breathing unlabored, no rales or wheezes Abdomen: Positive bowel sounds, soft nontender to palpation, nondistended Extremities: No clubbing, cyanosis, or edema  Neurologic: Cranial nerves II through XII intact-hard of hearing, motor strength is 5/5 in bilateral deltoid, bicep, tricep, grip, hip flexor, knee extensors, ankle dorsiflexor and plantar flexor + dysdiadochokinesis with sup/pron LUE  Finger to thumb opposition sl slower on left   Musculoskeletal: Full range of motion in all 4 extremities. No joint swelling   Assessment/Plan: 1. Functional deficits which require 3+ hours per day of interdisciplinary therapy in a comprehensive inpatient rehab setting. Physiatrist is providing close team supervision and 24 hour management of active medical problems listed below. Physiatrist and rehab team continue to assess barriers to discharge/monitor patient progress toward  functional and medical goals  Care Tool:  Bathing    Body parts bathed by patient: Right arm, Left arm, Chest, Face, Front perineal area, Abdomen, Right upper leg, Left upper leg, Right lower leg, Left lower leg, Buttocks   Body parts bathed by helper: Buttocks     Bathing assist Assist Level: Contact Guard/Touching assist     Upper Body Dressing/Undressing Upper body dressing   What is the patient wearing?: Pull over shirt    Upper body assist Assist Level: Set up assist    Lower Body Dressing/Undressing Lower body dressing      What is the patient wearing?: Underwear/pull up, Pants     Lower body assist Assist for lower body dressing: Contact Guard/Touching assist     Toileting Toileting    Toileting assist Assist for toileting: Contact Guard/Touching assist     Transfers Chair/bed transfer  Transfers assist     Chair/bed transfer assist level: Contact Guard/Touching assist Chair/bed transfer assistive device: Armrests, Walker   Locomotion Ambulation   Ambulation assist      Assist level: Minimal Assistance - Patient > 75% Assistive device: No Device Max distance: 150+   Walk 10 feet activity   Assist     Assist level: Minimal Assistance - Patient > 75%  Assistive device: No Device   Walk 50 feet activity   Assist    Assist level: Minimal Assistance - Patient > 75% Assistive device: No Device    Walk 150 feet activity   Assist Walk 150 feet activity did not occur: Safety/medical concerns  Assist level: Minimal Assistance - Patient > 75% Assistive device: No Device    Walk 10 feet on uneven surface  activity   Assist Walk 10 feet on uneven surfaces activity did not occur: Safety/medical concerns         Wheelchair     Assist Is the patient using a wheelchair?: Yes Type of Wheelchair: Manual    Wheelchair assist level: Dependent - Patient 0%      Wheelchair 50 feet with 2 turns activity    Assist         Assist Level: Dependent - Patient 0%   Wheelchair 150 feet activity     Assist      Assist Level: Dependent - Patient 0%   Blood pressure (!) 150/84, pulse 77, temperature (!) 97.4 F (36.3 C), temperature source Oral, resp. rate 17, height 5\' 7"  (1.702 m), weight 56 kg, SpO2 96%.  Medical Problem List and Plan: 1. 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  Antithrombotics: -DVT/anticoagulation:  Pharmaceutical: Lovenox             -antiplatelet therapy: DAPT x 3 weeks followed by Plavix alone.  3. Pain Management: tylenol prn.  4. Mood/Behavior/Sleep: LCSW to follow for evaluation and support.              -antipsychotic agents: N/A Somnolent states he did not sleep well check sleep graph , add melatonin, no sedating meds noted,  5. Neuropsych/cognition: This patient is? capable of making decisions on his own behalf, however is very HOH and maybe sundowning 6. Skin/Wound Care: Routine pressure relief measures.  7. Fluids/Electrolytes/Nutrition: Monitor I/O. BMET looks normal     Latest Ref Rng & Units 04/25/2023    5:50 AM 04/21/2023    6:24 AM 04/18/2023    6:23 AM  BMP  Glucose 70 - 99 mg/dL 89  92  96   BUN 8 - 23 mg/dL 14  21  18    Creatinine 0.61 - 1.24 mg/dL 4.09  8.11  9.14   Sodium 135 - 145 mmol/L 132  133  136   Potassium 3.5 - 5.1 mmol/L 3.9  4.1  4.4   Chloride 98 - 111 mmol/L 99  99  100   CO2 22 - 32 mmol/L 28  26  29    Calcium 8.9 - 10.3 mg/dL 9.0  8.9  9.6     8. HTN: Monitor BP TID--metoprolol resumed on 11/26, continue      Vitals:   04/26/23 2009 04/27/23 0529  BP: 114/79 (!) 150/84  Pulse: 96 77  Resp: 17 17  Temp: (!) 97.5 F (36.4 C) (!) 97.4 F (36.3 C)  SpO2: 100% 96%   12/10 some lability  on lower dose metoprolol 12.5 mg  BID increase to 25mg  BID monitor effect  and amlodipine 5mg  daily 9. 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 Dyslipidemia: LDL-98-->continue Lipitor 80 mg daily 12. Non cardiac chest pain: Was started on metoprolol with prn NTG 11/15 by Dr. Melburn Popper.   13. GERD: continue Pepcid. 14. 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  15. OSA?: Sleep study recommended at discharge.  16. Sundowning resolved  17. R dry eye/mild conjunctivitis- resolved  -Lubricating eyedrops started-medication was scheduled  18.  Constipation resolved schedule senna S last BM 12/10  LOS: 15 days A FACE TO FACE EVALUATION WAS PERFORMED  Erick Colace 04/27/2023, 9:44 AM

## 2023-04-27 NOTE — Progress Notes (Signed)
Speech Language Pathology Daily Session Note  Patient Details  Name: Su Amano MRN: [id number] Date of Birth: 1930/06/11  Today's Date: 04/27/2023 SLP Individual Time: 4132-4401 SLP Individual Time Calculation (min): 30 min  Short Term Goals: Week 2: SLP Short Term Goal 1 (Week 2): STG = LTG due to ELOS  Skilled Therapeutic Interventions: Skilled therapy session focused on cognitive goals. SLP facilitated session by providing supervision A for patient to recall short and long term memories. Patient identified family names, occupations, education and plans for the holidays. Patient with significantly increased processing speed compared to evaluation with immediate verbalizations. Patient reports he "feels much better due to rehab stay."  Patient left in bed with alarm set and call bell in reach. Continue POC.   Pain None reported  Therapy/Group: Individual Therapy  Arslan Kier M.A., CF-SLP 04/27/2023, 8:46 AM

## 2023-04-27 NOTE — Plan of Care (Signed)
  Problem: Consults Goal: RH STROKE PATIENT EDUCATION Description: See Patient Education module for education specifics  Outcome: Progressing   Problem: RH BOWEL ELIMINATION Goal: RH STG MANAGE BOWEL WITH ASSISTANCE Description: STG Manage Bowel with mod I Assistance. Outcome: Progressing Goal: RH STG MANAGE BOWEL W/MEDICATION W/ASSISTANCE Description: STG Manage Bowel with Medication with mod I Assistance. Outcome: Progressing   Problem: RH BLADDER ELIMINATION Goal: RH STG MANAGE BLADDER WITH ASSISTANCE Description: STG Manage Bladder With mod I Assistance Outcome: Progressing Goal: RH STG MANAGE BLADDER WITH MEDICATION WITH ASSISTANCE Description: STG Manage Bladder With Medication With mod I Assistance. Outcome: Progressing   Problem: RH SAFETY Goal: RH STG ADHERE TO SAFETY PRECAUTIONS W/ASSISTANCE/DEVICE Description: STG Adhere to Safety Precautions With cues Assistance/Device. Outcome: Progressing   Problem: RH PAIN MANAGEMENT Goal: RH STG PAIN MANAGED AT OR BELOW [name] PAIN GOAL Description: < 4 with prns Outcome: Progressing   Problem: RH KNOWLEDGE DEFICIT Goal: RH STG INCREASE KNOWLEDGE OF HYPERTENSION Description: Patient and wife will be able to manage HTN with medications and dietary modification using educational resources independently Outcome: Progressing Goal: RH STG INCREASE KNOWLEGDE OF HYPERLIPIDEMIA Description: Patient and wife will be able to manage HLD with medications and dietary modification using educational resources independently Outcome: Progressing Goal: RH STG INCREASE KNOWLEDGE OF STROKE PROPHYLAXIS Description: Patient and wife will be able to manage secondary risks with medications and dietary modification using educational resources independently Outcome: Progressing

## 2023-04-27 NOTE — Progress Notes (Addendum)
Patient received DME today. Daughter took patient's DME home.    Tilden Dome, LPN

## 2023-04-27 NOTE — Progress Notes (Signed)
Recreational Therapy Session Note  Patient Details  Name: Jaquavis Smeby MRN: [id number] Date of Birth: [birth year] Today's Date: 04/27/2023 No c/o  Pt participated in animal assisted activity seated in recliner with supervision.  PT remembered previous visit and easily engaged with pet partner team.  Pt smiling talk to [facility] and expressed appreciation for this visit.Tuwanna Krausz 04/27/2023, 3:46 PM

## 2023-04-27 NOTE — Progress Notes (Signed)
Speech Language Pathology Discharge Summary  Patient Details  Name: Haoxuan Orso MRN: [id number] Date of Birth: 1931-03-28  Date of Discharge from SLP service:April 28, 2023  {chl ip rehab slp time calculations:[id number]}   Skilled Therapeutic Interventions:  ***    Patient has met   of   long term goals.  Patient to discharge at overall   level.  Reasons goals not met:     Clinical Impression/Discharge Summary:  Pt has made excellent gains and has met 4 of 4 LTG's this admission due to improved cognition and swallowing safety. Pt is currently an overall supervision-minA for cognitive tasks and requires supervision cues for utilization of swallowing compensatory strategies to minimize overt s/sx of aspiration with regular/thin diet. Pt/family education complete and pt will discharge home with 24 hour supervision from friends/family/etc. Pt would benefit from OP f/u ST services to maximize cognition in order to maximize functional independence.     Care Partner:        Recommendation:         Equipment:     Reasons for discharge:        Trulee Hamstra M.A., CF-SLP 04/27/2023, 9:19 PM

## 2023-04-27 NOTE — Patient Care Conference (Signed)
Inpatient RehabilitationTeam Conference and Plan of Care Update Date: 04/27/2023   Time: 10:39 AM    Patient Name: Samuel Mata      Medical Record Number: [id number]  Date of Birth: 02-06-1931 Sex: Male         Room/Bed: 4W11C/4W11C-01 Payor Info: Payor: [employer] MEDICARE / Plan: [employer] MEDICARE HMO/PPO / Product Type: *No Product type* /    Admit Date/Time:  04/12/2023  6:27 PM  Primary Diagnosis:  Right thalamic stroke [facility])  Hospital Problems: Principal Problem:   Right thalamic stroke (HCC) Active Problems:   Bronchiectasis without complication (HCC)   Essential hypertension   Gastroesophageal reflux disease without esophagitis   OSA (obstructive sleep apnea)   Hyponatremia    Expected Discharge Date: Expected Discharge Date: 04/29/23  Team Members Present: Physician leading conference: Dr. Claudette Laws Social Worker Present: Lavera Guise, BSW Nurse Present: Chana Bode, RN PT Present: Casimiro Needle, PT OT Present: Mariann Barter, OT SLP Present: Everardo Pacific, SLP PPS Coordinator present : Fae Pippin, SLP     Current Status/Progress Goal Weekly Team Focus  Bowel/Bladder   patient has some incontinence of bladder   regain complete continence   offer toileting q4hrs and PRN    Swallow/Nutrition/ Hydration   reg/thin   supervision  utilization of swallowing compensatory strategies    ADL's   UB ADLs supervision, LB ADLs CGA progressing towards supervision, toileting CGA using RW for balance, stand pivot transfer using RW CGA progressing towards CGA- Barriers: Pt fluctuates and has increased L inattention when tired with increased risk of falls, mild dual tasking and cognitive deficits   supervision to CGA   family education, BADL retraining, L side attention, activity tolerance, transfer trianing, DME education    Mobility   supervision bed mobility, SBA sit<>stand and stand pivot transfers using RW, CGA/SBA gait 150-248ft using RW, stepping  up/down curb step using RW with SBA for safety to practice for home entry -  continues to have minor midline orientation impairment with L posterior lean bias that is worsened with sudden movements or during dual-task especially when not using B UE support - 20/56 on Berg Balance Test demonstrating high fall risk   CGA overall at ambulatory level; however, truly needs to reach supervision level based on family support  pt/family education, midline orientation with higher level dynamic balance and gait tasks, stair navigation for home entry, dynamic gait with LRAD    Communication                Safety/Cognition/ Behavioral Observations  minA   supervision-minA   utilization of memory, mildly complex problem solving    Pain   no c/o pain   remain pain free   assess pain qshift and PRN    Skin   skin intact   maintain integrity  assess skin qshift and PRN encouraging proper nutrition      Discharge Planning:  Discharging home on Friday with daughter and spouse. HH approved Side/PT/OT   Team Discussion: Patient post right thalamic CVA with left inattention, left posterior lean, cognitive deficits; problem solving and slow processing speed.   Patient on target to meet rehab goals: yes, currently needs supervision for upper body care and CGA for lower body care and CGA for toileting using a RW.  Needs CGA for stand pivot with RW, sit - stand transfers due to left lean and cl;ose supervision for ambulation with the RW due to impaired left foot clearance.   *See Care Plan and progress  notes for long and short-term goals.   Revisions to Treatment Plan:  15/7 therapy schedule   Teaching Needs: Safety, medications, transfers, toileting, etc.   Current Barriers to Discharge: Decreased caregiver support and Home enviroment access/layout  Possible Resolutions to Barriers: Family education Hired caregivers HH follow up services DME: TTB, RW     Medical Summary Current  Status: fatigues easily , sundowning resolved, left sided inattention     Possible Resolutions to [employer] Focus: working on balance , fall reduction , 15/7   Continued Need for Acute Rehabilitation Level of Care: The patient requires daily medical management by a physician with specialized training in physical medicine and rehabilitation for the following reasons: Direction of a multidisciplinary physical rehabilitation program to maximize functional independence : Yes Medical management of patient stability for increased activity during participation in an intensive rehabilitation regime.: Yes Analysis of laboratory values and/or radiology reports with any subsequent need for medication adjustment and/or medical intervention. : Yes   I attest that I was present, lead the team conference, and concur with the assessment and plan of the team.   Chana Bode B 04/27/2023, 4:51 PM

## 2023-04-28 LAB — BASIC METABOLIC PANEL
Anion gap: 6 (ref 5–15)
BUN: 21 mg/dL (ref 8–23)
CO2: 26 mmol/L (ref 22–32)
Calcium: 8.9 mg/dL (ref 8.9–10.3)
Chloride: 102 mmol/L (ref 98–111)
Creatinine, Ser: 1.02 mg/dL (ref 0.61–1.24)
GFR, Estimated: >60 mL/min (ref >60)
Glucose, Bld: 114 mg/dL — ABNORMAL HIGH (ref 70–99)
Potassium: 3.9 mmol/L (ref 3.5–5.1)
Sodium: 134 mmol/L — ABNORMAL LOW (ref 135–145)

## 2023-04-28 LAB — CBC
HCT: 40.4 % (ref 39.0–52.0)
Hemoglobin: 13.8 g/dL (ref 13.0–17.0)
MCH: 31.2 pg (ref 26.0–34.0)
MCHC: 34.2 g/dL (ref 30.0–36.0)
MCV: 91.4 fL (ref 80.0–100.0)
Platelets: 432 10*3/uL — ABNORMAL HIGH (ref 150–400)
RBC: 4.42 MIL/uL (ref 4.22–5.81)
RDW: 11.9 % (ref 11.5–15.5)
WBC: 8 10*3/uL (ref 4.0–10.5)
nRBC: 0 % (ref 0.0–0.2)

## 2023-04-28 LAB — GLUCOSE, CAPILLARY
Glucose-Capillary: 67 mg/dL — ABNORMAL LOW (ref 70–99)
Glucose-Capillary: 74 mg/dL (ref 70–99)
Glucose-Capillary: 82 mg/dL (ref 70–99)
Glucose-Capillary: 98 mg/dL (ref 70–99)
Glucose-Capillary: 107 mg/dL — ABNORMAL HIGH (ref 70–99)

## 2023-04-28 NOTE — Progress Notes (Signed)
Occupational Therapy Discharge Summary  Patient Details  Name: Samuel Mata MRN: [id number] Date of Birth: 1931-05-02  Date of Discharge from OT service:April 28, 2023  Today's Date: 04/28/2023 OT Individual Time: 4098-1191 OT Individual Time Calculation (min): 24 min    Patient has met 13 of 13 long term goals due to improved activity tolerance, improved balance, postural control, ability to compensate for deficits, functional use of  LEFT upper and LEFT lower extremity, improved attention, improved awareness, and improved coordination.  Patient to discharge at overall Supervision-CGA level.  Patient's care partner is independent to provide the necessary physical and cognitive assistance at discharge.    Reasons goals not met: All goals met  Recommendation:  Patient will benefit from ongoing skilled OT services in home health setting to continue to advance functional skills in the area of BADL and Reduce care partner burden.  Equipment: TTB  Reasons for discharge: treatment goals met and discharge from hospital  Patient/family agrees with progress made and goals achieved: Yes  OT Discharge Skilled Therapeutic Interventions/Progress Updates:  Pt received sitting up in recliner presenting to be in good spirits receptive to skilled OT session reporting 0/10 pain- OT offering intermittent rest breaks, repositioning, and therapeutic support to optimize participation in therapy session. Focus this session B UE HEP education.   Provided Pt with handout of B UE exercises to increase overall strength and endurance for increased independence in BADLs. OT demonstrated each exercise and provided verbal cues for technique and muscle engagement. Pt completed the following exercises seated in his wc using yellow (light resistance) therband:  - Seated Shoulder Horizontal Abduction with Resistance - Palms Down  - 1 x daily - 7 x weekly - 2 sets - 10 reps - Seated Elbow Flexion with Self-Anchored  Resistance  - 1 x daily - 7 x weekly - 2 sets - 10 reps - Seated Shoulder Diagonal Pulls with Resistance  - 1 x daily - 7 x weekly - 2 sets - 10 reps - Seated Elbow Extension with Self-Anchored Resistance  - 1 x daily - 7 x weekly - 2 sets - 10 reps - Seated Shoulder Flexion with Self-Anchored Resistance  - 1 x daily - 7 x weekly - 2 sets - 10 reps - Seated Bilateral Shoulder External Rotation with Resistance  - 1 x daily - 7 x weekly - 2 sets - 10 reps  Pt requesting to return to bed at end of session. Stand pivot wc>EOB using RW supervision. EOB>supine supervision. Pt was left resting in bed with call bell in reach, bed alarm on, and all needs met.    Precautions/Restrictions  Precautions Precautions: Fall;Other (comment) Precaution Comments: HOH (hears better in R ear), mild L inattention, L posterior lean Restrictions Weight Bearing Restrictions Per Provider Order: No Pain Pain Assessment Pain Scale: 0-10 Pain Score: 0-No pain ADL ADL Eating: Set up Where Assessed-Eating: Wheelchair Grooming: Setup Where Assessed-Grooming: Wheelchair Upper Body Bathing: Supervision/safety Where Assessed-Upper Body Bathing: Shower Lower Body Bathing: Contact guard Where Assessed-Lower Body Bathing: Shower Upper Body Dressing: Setup Where Assessed-Upper Body Dressing: Edge of bed Lower Body Dressing: Supervision/safety Where Assessed-Lower Body Dressing: Edge of bed Toileting: Supervision/safety Where Assessed-Toileting: Teacher, adult education: Close supervision Toilet Transfer Method: Proofreader: Engineer, technical sales: Not assessed Film/video editor: Administrator, arts Method: Designer, industrial/product: Grab bars, Emergency planning/management officer Vision Baseline Vision/History: 1 Wears glasses Patient Visual Report: No change from baseline;Other (comment) Vision Assessment?: No apparent visual  deficits Eye Alignment: Within  Functional Limits Ocular Range of Motion: Within Functional Limits Alignment/Gaze Preference: Within Defined Limits Saccades: Within functional limits Convergence: Within functional limits Visual Fields: No apparent deficits Depth Perception: Undershoots Perception  Perception: Impaired Praxis Praxis: WFL Cognition Cognition Overall Cognitive Status: Impaired/Different from baseline Arousal/Alertness: Awake/alert Orientation Level: Person;Place;Situation Person: Oriented Place: Oriented Situation: Oriented Memory: Impaired Memory Impairment: Storage deficit;Decreased short term memory;Retrieval deficit Attention: Focused;Sustained;Selective Focused Attention: Appears intact Sustained Attention: Appears intact Selective Attention: Impaired Selective Attention Impairment: Functional basic Awareness: Impaired Awareness Impairment: Anticipatory impairment Problem Solving: Impaired Problem Solving Impairment: Verbal complex;Functional complex Safety/Judgment: Impaired Brief Interview for Mental Status (BIMS) Repetition of Three Words (First Attempt): 3 Temporal Orientation: Year: Correct Temporal Orientation: Month: Accurate within 5 days Temporal Orientation: Day: Correct Recall: "Sock": Yes, no cue required Recall: "Blue": Yes, no cue required Recall: "Bed": Yes, no cue required BIMS Summary Score: 15 Sensation Sensation Light Touch: Appears Intact Hot/Cold: Not tested Proprioception: Appears Intact Stereognosis: Not tested Coordination Gross Motor Movements are Fluid and Coordinated: No Fine Motor Movements are Fluid and Coordinated: No Finger Nose Finger Test: decreased speed of motor movements with dysmetria with L UE Motor  Motor Motor: Other (comment);Abnormal postural alignment and control;Hemiplegia Motor - Discharge Observations: continues with mild L hemiparesis and impaired midline orientation with L posterior lean bias; however, improved from initial  eval Mobility  Bed Mobility Bed Mobility: Sit to Supine;Supine to Sit Supine to Sit: Supervision/Verbal cueing Sit to Supine: Supervision/Verbal cueing Transfers Sit to Stand: Supervision/Verbal cueing Stand to Sit: Supervision/Verbal cueing  Trunk/Postural Assessment  Cervical Assessment Cervical Assessment: Within Functional Limits Thoracic Assessment Thoracic Assessment: Exceptions to [facility] Lumbar Assessment Lumbar Assessment: Exceptions to [facility] Postural Control Postural Control: Within Functional Limits (using RW)  Balance Balance Balance Assessed: Yes Standardized Balance Assessment Standardized Balance Assessment: Berg Balance Test Berg Balance Test Sit to Stand: Able to stand  independently using hands (comes to stand with wide BOS) Standing Unsupported: Able to stand 2 minutes with supervision (stands with wide BOS) Sitting with Back Unsupported but Feet Supported on Floor or Stool: Able to sit safely and securely 2 minutes Stand to Sit: Uses backs of legs against chair to control descent Transfers: Able to transfer with verbal cueing and /or supervision Standing Unsupported with Eyes Closed: Able to stand 10 seconds with supervision Standing Ubsupported with Feet Together: Needs help to attain position and unable to hold for 15 seconds From Standing, Reach Forward with Outstretched Arm: Reaches forward but needs supervision From Standing Position, Pick up Object from Floor: Unable to pick up and needs supervision (when reaches down too far has posterior LOB and sits back down on mat, but can initiate it and return to standing with close supervision) From Standing Position, Turn to Look Behind Over each Shoulder: Needs supervision when turning Turn 360 Degrees: Needs assistance while turning Standing Unsupported, Alternately Place Feet on Step/Stool: Needs assistance to keep from falling or unable to try Standing Unsupported, One Foot in Front: Loses balance while stepping or  standing Standing on One Leg: Unable to try or needs assist to prevent fall Total Score: 20 Static Sitting Balance Static Sitting - Balance Support: Feet supported Static Sitting - Level of Assistance: 6: Modified independent (Device/Increase time) Dynamic Sitting Balance Dynamic Sitting - Balance Support: Feet supported Dynamic Sitting - Level of Assistance: 5: Stand by assistance;6: Modified independent (Device/Increase time) Static Standing Balance Static Standing - Balance Support: During functional activity;Bilateral upper extremity supported Static Standing -  Level of Assistance: 5: Stand by assistance Dynamic Standing Balance Dynamic Standing - Balance Support: During functional activity;Bilateral upper extremity supported Dynamic Standing - Level of Assistance: 5: Stand by assistance Extremity/Trunk Assessment RUE Assessment RUE Assessment: Within Functional Limits LUE Assessment LUE Assessment: Exceptions to [facility] General Strength Comments: 4/5 overall, mild weakness and midl slowed motor movements   [name] 04/28/2023, 4:22 PM

## 2023-04-28 NOTE — Plan of Care (Signed)
  Problem: RH Memory Goal: LTG Patient will use memory compensatory aids to (SLP) Description: LTG:  Patient will use memory compensatory aids to recall biographical/new, daily complex information with cues (SLP) Outcome: Completed/Met   Problem: RH Pre-functional/Other (Specify) Goal: RH LTG SLP (Specify) 1 Description: RH LTG SLP (Specify) 1 Outcome: Completed/Met   Problem: RH Swallowing Goal: LTG Patient will consume least restrictive diet using compensatory strategies with assistance (SLP) Description: LTG:  Patient will consume least restrictive diet using compensatory strategies with assistance (SLP) Outcome: Completed/Met   Problem: RH Problem Solving Goal: LTG Patient will demonstrate problem solving for (SLP) Description: LTG:  Patient will demonstrate problem solving for basic/complex daily situations with cues  (SLP) Outcome: Completed/Met

## 2023-04-28 NOTE — Progress Notes (Signed)
Patient ID: Samuel Mata, male   DOB: [birth year], 89 y.o.   MRN: [id number]   Patient ready for d/c tomorrow. HH established through [facility]. Aide/PT/OT.  DME delivered to the room and daughter took items home on yesterday 04/27/23.

## 2023-04-28 NOTE — Progress Notes (Signed)
Inpatient Rehabilitation Care Coordinator Discharge Note   Patient Details  Name: Samuel Mata MRN: [id number] Date of Birth: 1930-06-05   Discharge location: Home  Length of Stay: 17 Days  Discharge activity level: Sup/Cga  Home/community participation: spouse and daughters  Patient response WU:[id number] Literacy - How often do you need to have someone help you when you read instructions, pamphlets, or other written material from your doctor or pharmacy?: Never  Patient response YN:WGNFAO Isolation - How often do you feel lonely or isolated from those around you?: Never  Services provided included: MD, RD, PT, OT, Neuropsych, SLP, RN, CM, TR, Pharmacy, SW  Financial Services:  Field seismologist Utilized: [employer]  Choices offered to/list presented to: Patient and daughters.  Follow-up services arranged:  Home Health, DME Home Health Agency: [facility] PT OT AIDE    DME : RW and TTB    Patient response to transportation need: Is the patient able to respond to transportation needs?: Yes In the past 12 months, has lack of transportation kept you from medical appointments or from getting medications?: No In the past 12 months, has lack of transportation kept you from meetings, work, or from getting things needed for daily living?: No   Patient/Family verbalized understanding of follow-up arrangements:  Yes  Individual responsible for coordination of the follow-up plan: daughters  Confirmed correct DME delivered: [name] 04/28/2023    Comments (or additional information):  Summary of Stay    Date/Time Discharge Planning CSW  04/26/23 1617 Discharging home on Friday with daughter and spouse. HH approved Side/PT/OT CJB  04/19/23 1429 Discharging home with spouse able to provide supervision only. Spouse uses a RW.  1 local daughter, 1 daughter here from [place].  1 level home, 2 steps. Pt has: shower seat. SPC and BSC. CJB        [name]

## 2023-04-28 NOTE — Progress Notes (Signed)
Physical Therapy Discharge Summary  Patient Details  Name: Samuel Mata MRN: [id number] Date of Birth: 1930/10/16  Date of Discharge from PT service:April 28, 2023  Today's Date: 04/28/2023 PT Individual Time: 1009-1108 PT Individual Time Calculation (min): 59 min    Patient has met 9 of 9 long term goals due to improved activity tolerance, improved balance, improved postural control, increased strength, ability to compensate for deficits, functional use of  left upper extremity and left lower extremity, improved attention, and improved awareness.  Patient to discharge at an ambulatory level Supervision for household level tasks using RW.   Patient's care partner is independent to provide the necessary physical and cognitive assistance at discharge.  All goals met.  Recommendation:  Patient will benefit from ongoing skilled PT services in home health setting to continue to advance safe functional mobility, address ongoing impairments in dynamic standing balance and high level balance recovery strategies, midline orientation (has L posterior lean bias), dynamic gait training with LRAD, and minimize fall risk.  Equipment: RW  Reasons for discharge: treatment goals met and discharge from hospital  Patient/family agrees with progress made and goals achieved: Yes  Skilled Therapeutic Interventions/Progress Updates:  Pt received sitting in recliner with his daughter, [name], present and pt agreeable to therapy session. Pt and DTR report no specific questions/concerns for this therapist. Pt completed all sit<>stand transfers using RW with supervision throughout session - demos proper recall of set-up and hand placement without cuing. Performed bed mobility supine<>sit from elevated bed to simulate pt's home set-up (set at ~26.5-27in height) using bedrail with supervision - discussed that a lower bed height may be more safe for patient to help with scooting on EOB and ensuring adequate foot  contact on ground for balance support. Gait training ~272ft to ortho gym using RW with close supervision for safety but no LOB - continues to have poor L LE foot clearance, kicking L back leg of RW, poor R weight shift, and excessive forward trunk flexion - intermittent cuing throughout to improve to determine if pt will initiate self correction. Simulated ambulatory car transfer (SUV height) using RW with close supervision for safety - discussed when pt performing L turns when he is fatigued he doesn't always take a large enough L lateral step causing worsening L lean, but is able to use RW to remain upright. Gait training up/down ramp using RW with close supervision for safety and cuing to keep both hands on AD to ensure proper control of it. Simulated step-up on/off 5" curb step to practice for home entry - completed x2 using RW with only close SBA from therapist for safety and cuing to step closer prior to placing RW down off the step and he demonstrates understanding on 2nd trial. Transported back to room and pt left seated in w/c with needs in reach and seat belt alarm on.  PT Discharge Precautions/Restrictions Precautions Precautions: Fall;Other (comment) Precaution Comments: HOH (hears better in R ear), mild L inattention, L posterior lean Restrictions Weight Bearing Restrictions Per Provider Order: No Pain Pain Assessment Pain Scale: 0-10 Pain Score: 0-No pain Pain Interference Pain Interference Pain Effect on Sleep: 0. Does not apply - I have not had any pain or hurting in the past 5 days Pain Interference with Therapy Activities: 1. Rarely or not at all Pain Interference with Day-to-Day Activities: 1. Rarely or not at all Vision/Perception  Vision - History Ability to See in Adequate Light: 1 Impaired Perception Perception: Impaired Preception Impairment Details: Inattention/Neglect;Spatial orientation Perception-Other  Comments: continues to have minor L  inattention Praxis Praxis: WFL  Cognition Overall Cognitive Status: Impaired/Different from baseline Arousal/Alertness: Awake/alert Orientation Level: Oriented X4 Year: 2024 Month: December Day of Week: Correct Attention: Focused;Sustained;Selective Focused Attention: Appears intact Sustained Attention: Appears intact Selective Attention: Impaired Memory: Impaired Memory Impairment: Storage deficit;Decreased short term memory;Retrieval deficit Awareness: Impaired Problem Solving: Impaired Safety/Judgment: Impaired Comments: mildly impaired d/t continued difficulty remembering and problem solving how his balance deficits impact him, occasionally will move quick in an unsafe way but very rarely Sensation Sensation Light Touch: Appears Intact Hot/Cold: Not tested Proprioception: Appears Intact (difficult to truly test) Stereognosis: Not tested Coordination Gross Motor Movements are Fluid and Coordinated: No Coordination and Movement Description: continues to have minor impairments in GM movements due to L hemparesis and impaired midline orientation, but significantly improved from initial eval Motor  Motor Motor: Other (comment);Abnormal postural alignment and control;Hemiplegia Motor - Discharge Observations: continues with mild L hemiparesis and impaired midline orientation with L posterior lean bias; however, improved from initial eval  Mobility Bed Mobility Bed Mobility: Sit to Supine;Supine to Sit (using bedrail per home set-up) Supine to Sit: Supervision/Verbal cueing Sit to Supine: Supervision/Verbal cueing Transfers Transfers: Sit to Stand;Stand to Sit;Stand Pivot Transfers Sit to Stand: Supervision/Verbal cueing Stand to Sit: Supervision/Verbal cueing Stand Pivot Transfers: Supervision/Verbal cueing Stand Pivot Transfer Details: Verbal cues for safe use of DME/AE;Verbal cues for technique Transfer (Assistive device): Rolling walker Locomotion  Gait Ambulation:  Yes Gait Assistance: Supervision/Verbal cueing Gait Distance (Feet): 250 Feet Assistive device: Rolling walker Gait Assistance Details: Verbal cues for precautions/safety;Verbal cues for technique;Verbal cues for safe use of DME/AE;Verbal cues for gait pattern Gait Gait: Yes Gait Pattern: Impaired Gait Pattern: Poor foot clearance - left;Decreased step length - left;Step-through pattern;Decreased stride length;Decreased hip/knee flexion - left;Lateral trunk lean to left;Trunk flexed (L LE execessively externally rotated) Gait velocity: decreased Stairs / Additional Locomotion Stairs: Yes Stairs Assistance: Supervision/Verbal cueing Stair Management Technique: Forwards;With walker Number of Stairs: 6 Height of Stairs: 4 Curb: Supervision/Verbal cueing Wheelchair Mobility Wheelchair Mobility: No  Trunk/Postural Assessment  Cervical Assessment Cervical Assessment: Within Functional Limits Thoracic Assessment Thoracic Assessment: Exceptions to [facility] (rounded shoulders) Lumbar Assessment Lumbar Assessment: Exceptions to [facility] (posterior pelvic tilt in sitting) Postural Control Postural Control: Within Functional Limits (using RW)  Balance Balance Balance Assessed: Yes Standardized Balance Assessment Standardized Balance Assessment: Berg Balance Test Berg Balance Test Sit to Stand: Able to stand  independently using hands (comes to stand with wide BOS) Standing Unsupported: Able to stand 2 minutes with supervision (stands with wide BOS) Sitting with Back Unsupported but Feet Supported on Floor or Stool: Able to sit safely and securely 2 minutes Stand to Sit: Uses backs of legs against chair to control descent Transfers: Able to transfer with verbal cueing and /or supervision Standing Unsupported with Eyes Closed: Able to stand 10 seconds with supervision Standing Ubsupported with Feet Together: Needs help to attain position and unable to hold for 15 seconds From Standing, Reach Forward  with Outstretched Arm: Reaches forward but needs supervision From Standing Position, Pick up Object from Floor: Unable to pick up and needs supervision (when reaches down too far has posterior LOB and sits back down on mat, but can initiate it and return to standing with close supervision) From Standing Position, Turn to Look Behind Over each Shoulder: Needs supervision when turning Turn 360 Degrees: Needs assistance while turning Standing Unsupported, Alternately Place Feet on Step/Stool: Needs assistance to keep from falling or unable  to try Standing Unsupported, One Foot in Front: Loses balance while stepping or standing Standing on One Leg: Unable to try or needs assist to prevent fall Total Score: 20 Static Sitting Balance Static Sitting - Balance Support: Feet supported Static Sitting - Level of Assistance: 6: Modified independent (Device/Increase time) Dynamic Sitting Balance Dynamic Sitting - Balance Support: Feet supported Dynamic Sitting - Level of Assistance: 5: Stand by assistance;6: Modified independent (Device/Increase time) Static Standing Balance Static Standing - Balance Support: During functional activity;Bilateral upper extremity supported Static Standing - Level of Assistance: 5: Stand by assistance Dynamic Standing Balance Dynamic Standing - Balance Support: During functional activity;Bilateral upper extremity supported Dynamic Standing - Level of Assistance: 5: Stand by assistance Extremity Assessment      RLE Assessment RLE Assessment: Within Functional Limits Active Range of Motion (AROM) Comments: WFL/WNL General Strength Comments: assessed sitting in recliner RLE Strength Right Hip Flexion: 4+/5 Right Knee Flexion: 5/5 Right Knee Extension: 5/5 Right Ankle Dorsiflexion: 5/5 Right Ankle Plantar Flexion: 5/5 LLE Assessment LLE Assessment: Exceptions to [facility] Active Range of Motion (AROM) Comments: WFL, tightness in ankle but [facility] General Strength Comments:  assessed sitting in recliner LLE Strength Left Hip Flexion: 4-/5 Left Knee Flexion: 4-/5 Left Knee Extension: 4/5 Left Ankle Dorsiflexion: 4-/5 Left Ankle Plantar Flexion: 4-/5   Lyndall Bellot M Kratos Ruscitti , PT, DPT, NCS, CSRS 04/28/2023, 10:24 AM

## 2023-04-28 NOTE — Plan of Care (Signed)
  Problem: RH Balance Goal: LTG: Patient will maintain dynamic sitting balance (OT) Description: LTG:  Patient will maintain dynamic sitting balance with assistance during activities of daily living (OT) Outcome: Completed/Met Goal: LTG Patient will maintain dynamic standing with ADLs (OT) Description: LTG:  Patient will maintain dynamic standing balance with assist during activities of daily living (OT)  Outcome: Completed/Met   Problem: Sit to Stand Goal: LTG:  Patient will perform sit to stand in prep for activites of daily living with assistance level (OT) Description: LTG:  Patient will perform sit to stand in prep for activites of daily living with assistance level (OT) Outcome: Completed/Met   Problem: RH Grooming Goal: LTG Patient will perform grooming w/assist,cues/equip (OT) Description: LTG: Patient will perform grooming with assist, with/without cues using equipment (OT) Outcome: Completed/Met   Problem: RH Bathing Goal: LTG Patient will bathe all body parts with assist levels (OT) Description: LTG: Patient will bathe all body parts with assist levels (OT) Outcome: Completed/Met   Problem: RH Dressing Goal: LTG Patient will perform upper body dressing (OT) Description: LTG Patient will perform upper body dressing with assist, with/without cues (OT). Outcome: Completed/Met Goal: LTG Patient will perform lower body dressing w/assist (OT) Description: LTG: Patient will perform lower body dressing with assist, with/without cues in positioning using equipment (OT) Outcome: Completed/Met   Problem: RH Toileting Goal: LTG Patient will perform toileting task (3/3 steps) with assistance level (OT) Description: LTG: Patient will perform toileting task (3/3 steps) with assistance level (OT)  Outcome: Completed/Met   Problem: RH Functional Use of Upper Extremity Goal: LTG Patient will use RT/LT upper extremity as a (OT) Description: LTG: Patient will use right/left upper  extremity as a stabilizer/gross assist/diminished/nondominant/dominant level with assist, with/without cues during functional activity (OT) Outcome: Completed/Met   Problem: RH Toilet Transfers Goal: LTG Patient will perform toilet transfers w/assist (OT) Description: LTG: Patient will perform toilet transfers with assist, with/without cues using equipment (OT) Outcome: Completed/Met   Problem: RH Tub/Shower Transfers Goal: LTG Patient will perform tub/shower transfers w/assist (OT) Description: LTG: Patient will perform tub/shower transfers with assist, with/without cues using equipment (OT) Outcome: Completed/Met   Problem: RH Attention Goal: LTG Patient will demonstrate this level of attention during functional activites (OT) Description: LTG:  Patient will demonstrate this level of attention during functional activites  (OT) Outcome: Completed/Met   Problem: RH Awareness Goal: LTG: Patient will demonstrate awareness during functional activites type of (OT) Description: LTG: Patient will demonstrate awareness during functional activites type of (OT) Outcome: Completed/Met

## 2023-04-28 NOTE — Progress Notes (Signed)
PROGRESS NOTE   Subjective/Complaints  Working with SLP no new issues , aware of discharge   ROS-denies fever, chills, chest pain, shortness of breath, abdominal pain, nausea, vomiting, +hard of hearing  Objective:   No results found. Recent Labs    04/28/23 0644  WBC 8.0  HGB 13.8  HCT 40.4  PLT 432*     Recent Labs    04/28/23 0644  NA 134*  K 3.9  CL 102  CO2 26  GLUCOSE 114*  BUN 21  CREATININE 1.02  CALCIUM 8.9      Intake/Output Summary (Last 24 hours) at 04/28/2023 0839 Last data filed at 04/28/2023 0806 Gross per 24 hour  Intake 720 ml  Output 175 ml  Net 545 ml        Physical Exam: Vital Signs Blood pressure 133/67, pulse 82, temperature 97.9 F (36.6 C), temperature source Oral, resp. rate 18, height 5\' 7"  (1.702 m), weight 56 kg, SpO2 96%.   General: No acute distress ENT: hard of hearing Mood and affect are appropriate Heart: Regular rate and rhythm no rubs murmurs or extra sounds Lungs: Clear to auscultation, breathing unlabored, no rales or wheezes Abdomen: Positive bowel sounds, soft nontender to palpation, nondistended Extremities: No clubbing, cyanosis, or edema  Neurologic: Cranial nerves II through XII intact-hard of hearing, motor strength is 5/5 in bilateral deltoid, bicep, tricep, grip, hip flexor, knee extensors, ankle dorsiflexor and plantar flexor + dysdiadochokinesis with sup/pron LUE  Finger to thumb opposition sl slower on left   Musculoskeletal: Full range of motion in all 4 extremities. No joint swelling   Assessment/Plan: 1. Functional deficits which require 3+ hours per day of interdisciplinary therapy in a comprehensive inpatient rehab setting. Physiatrist is providing close team supervision and 24 hour management of active medical problems listed below. Physiatrist and rehab team continue to assess barriers to discharge/monitor patient progress toward  functional and medical goals  Care Tool:  Bathing    Body parts bathed by patient: Right arm, Left arm, Chest, Face, Front perineal area, Abdomen, Right upper leg, Left upper leg, Right lower leg, Left lower leg, Buttocks   Body parts bathed by helper: Buttocks     Bathing assist Assist Level: Contact Guard/Touching assist     Upper Body Dressing/Undressing Upper body dressing   What is the patient wearing?: Pull over shirt    Upper body assist Assist Level: Set up assist    Lower Body Dressing/Undressing Lower body dressing      What is the patient wearing?: Underwear/pull up, Pants     Lower body assist Assist for lower body dressing: Contact Guard/Touching assist     Toileting Toileting    Toileting assist Assist for toileting: Contact Guard/Touching assist     Transfers Chair/bed transfer  Transfers assist     Chair/bed transfer assist level: Contact Guard/Touching assist Chair/bed transfer assistive device: Armrests, Walker   Locomotion Ambulation   Ambulation assist      Assist level: Minimal Assistance - Patient > 75% Assistive device: No Device Max distance: 150+   Walk 10 feet activity   Assist     Assist level: Minimal Assistance - Patient > 75%  Assistive device: No Device   Walk 50 feet activity   Assist    Assist level: Minimal Assistance - Patient > 75% Assistive device: No Device    Walk 150 feet activity   Assist Walk 150 feet activity did not occur: Safety/medical concerns  Assist level: Minimal Assistance - Patient > 75% Assistive device: No Device    Walk 10 feet on uneven surface  activity   Assist Walk 10 feet on uneven surfaces activity did not occur: Safety/medical concerns         Wheelchair     Assist Is the patient using a wheelchair?: Yes Type of Wheelchair: Manual    Wheelchair assist level: Dependent - Patient 0%      Wheelchair 50 feet with 2 turns activity    Assist         Assist Level: Dependent - Patient 0%   Wheelchair 150 feet activity     Assist      Assist Level: Dependent - Patient 0%   Blood pressure 133/67, pulse 82, temperature 97.9 F (36.6 C), temperature source Oral, resp. rate 18, height 5\' 7"  (1.702 m), weight 56 kg, SpO2 96%.  Medical Problem List and Plan: 1. 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  Antithrombotics: -DVT/anticoagulation:  Pharmaceutical: Lovenox             -antiplatelet therapy: DAPT x 3 weeks followed by Plavix alone.  3. Pain Management: tylenol prn.  4. Mood/Behavior/Sleep: LCSW to follow for evaluation and support.              -antipsychotic agents: N/A Somnolent states he did not sleep well check sleep graph , add melatonin, no sedating meds noted,  5. Neuropsych/cognition: This patient is? capable of making decisions on his own behalf, however is very HOH and maybe sundowning 6. Skin/Wound Care: Routine pressure relief measures.  7. Fluids/Electrolytes/Nutrition: Monitor I/O. BMET looks normal     Latest Ref Rng & Units 04/28/2023    6:44 AM 04/25/2023    5:50 AM 04/21/2023    6:24 AM  BMP  Glucose 70 - 99 mg/dL 332  89  92   BUN 8 - 23 mg/dL 21  14  21    Creatinine 0.61 - 1.24 mg/dL 9.51  8.84  1.66   Sodium 135 - 145 mmol/L 134  132  133   Potassium 3.5 - 5.1 mmol/L 3.9  3.9  4.1   Chloride 98 - 111 mmol/L 102  99  99   CO2 22 - 32 mmol/L 26  28  26    Calcium 8.9 - 10.3 mg/dL 8.9  9.0  8.9     8. HTN: Monitor BP TID--metoprolol resumed on 11/26, continue      Vitals:   04/27/23 1958 04/28/23 0413  BP: 109/65 133/67  Pulse: 82 82  Resp: 18 18  Temp: 97.9 F (36.6 C) 97.9 F (36.6 C)  SpO2: 97% 96%   12/10 some lability  on lower dose metoprolol 12.5 mg BID increase to 25mg   BID monitor effect  and amlodipine 5mg  daily 9. 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 Dyslipidemia: LDL-98-->continue Lipitor 80 mg daily 12. Non cardiac chest pain: Was started on metoprolol with prn NTG 11/15 by Dr. Melburn Popper.   13. GERD: continue Pepcid. 14. 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  15. OSA?: Sleep study recommended at discharge.  16. Sundowning resolved  17. R dry eye/mild conjunctivitis- resolved  -Lubricating eyedrops started-medication was scheduled  18.  Constipation resolved schedule senna S last BM 12/10  LOS: 16 days A FACE TO FACE EVALUATION WAS PERFORMED  Erick Colace 04/28/2023, 8:39 AM

## 2023-04-29 ENCOUNTER — Other Ambulatory Visit (HOSPITAL_COMMUNITY): Payer: Self-pay

## 2023-04-29 LAB — GLUCOSE, CAPILLARY: Glucose-Capillary: 75 mg/dL (ref 70–99)

## 2023-04-29 MED ORDER — FAMOTIDINE 20 MG PO TABS
20.0000 mg | ORAL_TABLET | Freq: Every day | ORAL | 0 refills | Status: DC
  Filled 2023-04-29: qty 30, 30d supply, fill #0

## 2023-04-29 MED ORDER — AMLODIPINE BESYLATE 5 MG PO TABS
5.0000 mg | ORAL_TABLET | Freq: Every day | ORAL | 0 refills | Status: DC
  Filled 2023-04-29: qty 30, 30d supply, fill #0

## 2023-04-29 MED ORDER — CLOPIDOGREL BISULFATE 75 MG PO TABS
75.0000 mg | ORAL_TABLET | Freq: Every day | ORAL | 0 refills | Status: DC
  Filled 2023-04-29: qty 30, 30d supply, fill #0

## 2023-04-29 MED ORDER — POLYVINYL ALCOHOL 1.4 % OP SOLN
2.0000 [drp] | Freq: Four times a day (QID) | OPHTHALMIC | 0 refills | Status: DC
  Filled 2023-04-29: qty 15, 38d supply, fill #0

## 2023-04-29 MED ORDER — MELATONIN 3 MG PO TABS
3.0000 mg | ORAL_TABLET | Freq: Every day | ORAL | 0 refills | Status: DC
  Filled 2023-04-29: qty 30, 30d supply, fill #0

## 2023-04-29 MED ORDER — METOPROLOL TARTRATE 25 MG PO TABS
25.0000 mg | ORAL_TABLET | Freq: Two times a day (BID) | ORAL | 0 refills | Status: DC
  Filled 2023-04-29 – 2023-05-27 (×3): qty 60, 30d supply, fill #0

## 2023-04-29 MED ORDER — ROSUVASTATIN CALCIUM 20 MG PO TABS
20.0000 mg | ORAL_TABLET | Freq: Every day | ORAL | 0 refills | Status: DC
  Filled 2023-04-29: qty 30, 30d supply, fill #0

## 2023-04-29 MED ORDER — POLYETHYLENE GLYCOL 3350 17 GM/SCOOP PO POWD
17.0000 g | Freq: Every day | ORAL | 0 refills | Status: DC
  Filled 2023-04-29: qty 476, 28d supply, fill #0

## 2023-04-29 MED ORDER — SENNOSIDES-DOCUSATE SODIUM 8.6-50 MG PO TABS
2.0000 | ORAL_TABLET | Freq: Two times a day (BID) | ORAL | 0 refills | Status: DC
  Filled 2023-04-29: qty 100, 25d supply, fill #0

## 2023-04-29 MED ORDER — TAMSULOSIN HCL 0.4 MG PO CAPS
0.4000 mg | ORAL_CAPSULE | Freq: Every day | ORAL | 0 refills | Status: DC
  Filled 2023-04-29: qty 30, 30d supply, fill #0

## 2023-04-29 NOTE — Progress Notes (Signed)
PROGRESS NOTE   Subjective/Complaints  Pleased about d/c, 12/12 labs reviewed , discussed no driving   ROS-denies fever, chills, chest pain, shortness of breath, abdominal pain, nausea, vomiting, +hard of hearing  Objective:   No results found. Recent Labs    04/28/23 0644  WBC 8.0  HGB 13.8  HCT 40.4  PLT 432*     Recent Labs    04/28/23 0644  NA 134*  K 3.9  CL 102  CO2 26  GLUCOSE 114*  BUN 21  CREATININE 1.02  CALCIUM 8.9      Intake/Output Summary (Last 24 hours) at 04/29/2023 0814 Last data filed at 04/29/2023 0740 Gross per 24 hour  Intake 936 ml  Output 400 ml  Net 536 ml        Physical Exam: Vital Signs Blood pressure 125/71, pulse 61, temperature 98 F (36.7 C), resp. rate 17, height 5\' 7"  (1.702 m), weight 55.9 kg, SpO2 97%.   General: No acute distress ENT: hard of hearing Mood and affect are appropriate Heart: Regular rate and rhythm no rubs murmurs or extra sounds Lungs: Clear to auscultation, breathing unlabored, no rales or wheezes Abdomen: Positive bowel sounds, soft nontender to palpation, nondistended Extremities: No clubbing, cyanosis, or edema  Neurologic: Cranial nerves II through XII intact-hard of hearing, motor strength is 5/5 in bilateral deltoid, bicep, tricep, grip, hip flexor, knee extensors, ankle dorsiflexor and plantar flexor + dysdiadochokinesis with sup/pron LUE  Finger to thumb opposition sl slower on left   Musculoskeletal: Full range of motion in all 4 extremities. No joint swelling   Assessment/Plan: 1. Functional deficits which require 3+ hours per day of interdisciplinary therapy in a comprehensive inpatient rehab setting. Physiatrist is providing close team supervision and 24 hour management of active medical problems listed below. Physiatrist and rehab team continue to assess barriers to discharge/monitor patient progress toward functional and  medical goals  Care Tool:  Bathing    Body parts bathed by patient: Right arm, Left arm, Chest, Face, Front perineal area, Abdomen, Right upper leg, Left upper leg, Right lower leg, Left lower leg, Buttocks   Body parts bathed by helper: Buttocks     Bathing assist Assist Level: Contact Guard/Touching assist     Upper Body Dressing/Undressing Upper body dressing   What is the patient wearing?: Pull over shirt    Upper body assist Assist Level: Set up assist    Lower Body Dressing/Undressing Lower body dressing      What is the patient wearing?: Underwear/pull up, Pants     Lower body assist Assist for lower body dressing: Supervision/Verbal cueing     Toileting Toileting    Toileting assist Assist for toileting: Supervision/Verbal cueing     Transfers Chair/bed transfer  Transfers assist     Chair/bed transfer assist level: Supervision/Verbal cueing Chair/bed transfer assistive device: Geologist, engineering   Ambulation assist      Assist level: Supervision/Verbal cueing Assistive device: Walker-rolling Max distance: 248ft   Walk 10 feet activity   Assist     Assist level: Minimal Assistance - Patient > 75% Assistive device: Walker-rolling   Walk 50 feet activity   Assist  Assist level: Supervision/Verbal cueing Assistive device: Walker-rolling    Walk 150 feet activity   Assist Walk 150 feet activity did not occur: Safety/medical concerns  Assist level: Supervision/Verbal cueing Assistive device: Walker-rolling    Walk 10 feet on uneven surface  activity   Assist Walk 10 feet on uneven surfaces activity did not occur: Safety/medical concerns   Assist level: Supervision/Verbal cueing Assistive device: Walker-rolling   Wheelchair     Assist Is the patient using a wheelchair?: No Type of Wheelchair: Manual    Wheelchair assist level: Dependent - Patient 0%      Wheelchair 50 feet with 2 turns  activity    Assist        Assist Level: Dependent - Patient 0%   Wheelchair 150 feet activity     Assist      Assist Level: Dependent - Patient 0%   Blood pressure 125/71, pulse 61, temperature 98 F (36.7 C), resp. rate 17, height 5\' 7"  (1.702 m), weight 55.9 kg, SpO2 97%.  Medical Problem List and Plan: 1. 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  Antithrombotics: -DVT/anticoagulation:  Pharmaceutical: Lovenox             -antiplatelet therapy: Plavix 75mg  qd 3. Pain Management: tylenol prn.  4. Mood/Behavior/Sleep: LCSW to follow for evaluation and support.              -antipsychotic agents: N/A Somnolent states he did not sleep well check sleep graph , add melatonin, no sedating meds noted,  5. Neuropsych/cognition: This patient is? capable of making decisions on his own behalf, however is very HOH and maybe sundowning 6. Skin/Wound Care: Routine pressure relief measures.  7. Fluids/Electrolytes/Nutrition: Monitor I/O. BMET looks normal     Latest Ref Rng & Units 04/28/2023    6:44 AM 04/25/2023    5:50 AM 04/21/2023    6:24 AM  BMP  Glucose 70 - 99 mg/dL 161  89  92   BUN 8 - 23 mg/dL 21  14  21    Creatinine 0.61 - 1.24 mg/dL 0.96  0.45  4.09   Sodium 135 - 145 mmol/L 134  132  133   Potassium 3.5 - 5.1 mmol/L 3.9  3.9  4.1   Chloride 98 - 111 mmol/L 102  99  99   CO2 22 - 32 mmol/L 26  28  26    Calcium 8.9 - 10.3 mg/dL 8.9  9.0  8.9     8. HTN:f/u PCP      Vitals:   04/28/23 1927 04/29/23 0429  BP: 111/86 125/71  Pulse: 89 61  Resp: 19 17  Temp: 98 F (36.7 C) 98 F (36.7 C)  SpO2: 96% 97%  Controlled Metoprolol 25mg  BID monitor effect  and amlodipine 5mg  daily 9. 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 Dyslipidemia: LDL-98-->continue Lipitor 80 mg daily 12. Non cardiac chest pain: Was started on metoprolol with prn NTG 11/15 by Dr. Melburn Popper.   13. GERD: continue Pepcid. 14. 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  15. OSA?: Sleep study recommended at discharge.  16. Sundowning resolved  17. R dry eye/mild conjunctivitis- resolved  -Lubricating eyedrops started-medication was scheduled  18.  Constipation resolved schedule senna S last BM 12/10  LOS: 17 days A FACE TO FACE EVALUATION WAS PERFORMED  Erick Colace 04/29/2023, 8:14 AM

## 2023-04-29 NOTE — Progress Notes (Signed)
Carelink Summary Report / Loop Recorder 

## 2023-04-29 NOTE — Progress Notes (Signed)
Inpatient Rehabilitation Discharge Medication Review by a Pharmacist  A complete drug regimen review was completed for this patient to identify any potential clinically significant medication issues.  High Risk Drug Classes Is patient taking? Indication by Medication  Antipsychotic No   Anticoagulant No   Antibiotic No   Opioid No   Antiplatelet Yes Clopidogrel - stroke prophylaxis  Hypoglycemics/insulin No   Vasoactive Medication Yes Amlodipine, metoprolol - hypertension  Chemotherapy No   Other Yes Famotidine - GERD Fluticasone - rhinitis Melatonin - sleep Rosuvastatin - hyperlipidemia Tamsulosin - BPH Senna-docusate, miralax - laxatives Artificial Tears - eye dryness Clear Eyes - eye redness  PRNs: Acetaminophen - mild pain Calcium carbonate - indigestion Nitroglycerin SL - chest pain Saline nasal spray - congestion     Type of Medication Issue Identified Description of Issue Recommendation(s)  Drug Interaction(s) (clinically significant)     Duplicate Therapy     Allergy     No Medication Administration End Date     Incorrect Dose     Additional Drug Therapy Needed     Significant med changes from prior encounter (inform family/care partners about these prior to discharge). Asprin discontinued after 04/28/23 dose. New Clopidogrel while inpatient.  Atorvastatin changed to rosuvastatin. Metoprolol XL changed to IR. New amlodipine, melatonin, laxatives. Clopidogrel to continue indefinitely.   Communicate changes with patient/family prior to discharge.  Other       Clinically significant medication issues were identified that warrant physician communication and completion of prescribed/recommended actions by midnight of the next day:  No  Pharmacist comments:  - Asprin and Clopidogrel x 21 days completed 12/12; to continue Clopidogrel.  Time spent performing this drug regimen review (minutes):  20   Dennie Fetters, [place] 04/29/2023 9:29 AM

## 2023-05-01 ENCOUNTER — Encounter (HOSPITAL_COMMUNITY): Payer: Self-pay

## 2023-05-01 ENCOUNTER — Emergency Department (HOSPITAL_COMMUNITY)
Admission: EM | Admit: 2023-05-01 | Discharge: 2023-05-01 | Disposition: A | Discharge status: Home / Self Care | Payer: Medicare HMO | Attending: Emergency Medicine | Admitting: Emergency Medicine

## 2023-05-01 ENCOUNTER — Other Ambulatory Visit: Payer: Self-pay

## 2023-05-01 ENCOUNTER — Emergency Department (HOSPITAL_COMMUNITY): Payer: Medicare HMO

## 2023-05-01 DIAGNOSIS — R0602 Shortness of breath: Secondary | ICD-10-CM | POA: Diagnosis present

## 2023-05-01 DIAGNOSIS — I10 Essential (primary) hypertension: Secondary | ICD-10-CM | POA: Insufficient documentation

## 2023-05-01 DIAGNOSIS — Z8673 Personal history of transient ischemic attack (TIA), and cerebral infarction without residual deficits: Secondary | ICD-10-CM | POA: Insufficient documentation

## 2023-05-01 DIAGNOSIS — D649 Anemia, unspecified: Secondary | ICD-10-CM | POA: Insufficient documentation

## 2023-05-01 DIAGNOSIS — R5383 Other fatigue: Secondary | ICD-10-CM

## 2023-05-01 DIAGNOSIS — Z7902 Long term (current) use of antithrombotics/antiplatelets: Secondary | ICD-10-CM | POA: Insufficient documentation

## 2023-05-01 DIAGNOSIS — U071 COVID-19: Secondary | ICD-10-CM | POA: Insufficient documentation

## 2023-05-01 DIAGNOSIS — Z79899 Other long term (current) drug therapy: Secondary | ICD-10-CM | POA: Insufficient documentation

## 2023-05-01 DIAGNOSIS — R531 Weakness: Secondary | ICD-10-CM

## 2023-05-01 LAB — RESP PANEL BY RT-PCR (RSV, FLU A&B, COVID)  RVPGX2
Influenza A by PCR: NEGATIVE
Influenza B by PCR: NEGATIVE
Resp Syncytial Virus by PCR: NEGATIVE
SARS Coronavirus 2 by RT PCR: POSITIVE — AB

## 2023-05-01 LAB — COMPREHENSIVE METABOLIC PANEL
ALT: 13 U/L (ref 0–44)
AST: 21 U/L (ref 15–41)
Albumin: 2.7 g/dL — ABNORMAL LOW (ref 3.5–5.0)
Alkaline Phosphatase: 50 U/L (ref 38–126)
Anion gap: 6 (ref 5–15)
BUN: 20 mg/dL (ref 8–23)
CO2: 27 mmol/L (ref 22–32)
Calcium: 8.7 mg/dL — ABNORMAL LOW (ref 8.9–10.3)
Chloride: 101 mmol/L (ref 98–111)
Creatinine, Ser: 0.89 mg/dL (ref 0.61–1.24)
GFR, Estimated: >60 mL/min (ref >60)
Glucose, Bld: 107 mg/dL — ABNORMAL HIGH (ref 70–99)
Potassium: 4.7 mmol/L (ref 3.5–5.1)
Sodium: 134 mmol/L — ABNORMAL LOW (ref 135–145)
Total Bilirubin: 0.6 mg/dL (ref <1.2)
Total Protein: 6.5 g/dL (ref 6.5–8.1)

## 2023-05-01 LAB — CBC
HCT: 39 % (ref 39.0–52.0)
Hemoglobin: 12.9 g/dL — ABNORMAL LOW (ref 13.0–17.0)
MCH: 30.9 pg (ref 26.0–34.0)
MCHC: 33.1 g/dL (ref 30.0–36.0)
MCV: 93.3 fL (ref 80.0–100.0)
Platelets: 322 10*3/uL (ref 150–400)
RBC: 4.18 MIL/uL — ABNORMAL LOW (ref 4.22–5.81)
RDW: 11.9 % (ref 11.5–15.5)
WBC: 6.8 10*3/uL (ref 4.0–10.5)
nRBC: 0 % (ref 0.0–0.2)

## 2023-05-01 LAB — MAGNESIUM: Magnesium: 2 mg/dL (ref 1.7–2.4)

## 2023-05-01 MED ORDER — BENZONATATE 100 MG PO CAPS: 100.0000 mg | ORAL_CAPSULE | Freq: Three times a day (TID) | ORAL | 0 refills | Status: DC

## 2023-05-01 NOTE — Care Management (Signed)
Patient just Dc from hospital on 12/13- at that time she was set up with [facility] PT OT and aide.. She received DME.  : [employer]

## 2023-05-01 NOTE — ED Triage Notes (Signed)
Pt tested positive for COVID today. Pt has been having SOB, fatigue, loss of appetite, head congestion, HA, productive cough started last night. Pt sent by PCP.

## 2023-05-01 NOTE — Discharge Instructions (Signed)
Your laboratory evaluation was overall reassuring.  Your chest x-ray revealed fibrotic lungs however you do not have an oxygen requirement.  Tessalon has been prescribed for your cough, recommend you continue to orally rehydrate at home, take Tylenol for muscle aches and fever.  Return for any severe worsening symptoms.

## 2023-05-01 NOTE — ED Notes (Signed)
PO Challenge with crackers and water. Family asked about cough medicine.  Message sent to provider.

## 2023-05-01 NOTE — ED Provider Notes (Addendum)
[place] EMERGENCY DEPARTMENT AT [facility] Provider Note   CSN: [id number] Arrival date & time: 05/01/23  1618     History  Chief Complaint  Patient presents with   Shortness of Breath    Samuel Mata is a 89 y.o. male.   Shortness of Breath Associated symptoms: cough   Associated symptoms: no chest pain      89 year old male with medical history significant for hypertension, prior left frontoparietal CVA, bronchiectasis and pulmonary fibrosis, presenting to the emergency department with symptoms of COVID.  The patient had been feeling unwell since his discharge from from the hospital on 12/13.  He developed a productive cough last night.  He tested positive for COVID-19 at home earlier today.  He has been feeling unwell and has not been tolerating oral intake.  Endorses worsening fatigue.  He denies any chest pain.  He continues to have baseline left lower extremity weakness in the setting of his prior stroke.  No new weakness noticed that is focal.  No vomiting, no abdominal pain.  Home Medications Prior to Admission medications   Medication Sig Start Date End Date Taking? Authorizing Provider  benzonatate (TESSALON) 100 MG capsule Take 1 capsule (100 mg total) by mouth every 8 (eight) hours. 05/01/23  Yes Ernie Avena, MD  acetaminophen (TYLENOL) 325 MG tablet Take 650 mg by mouth as needed for headache.    [provider]  amLODipine (NORVASC) 5 MG tablet Take 1 tablet (5 mg total) by mouth daily. 04/29/23   Setzer, Lynnell Jude, PA-C  Calcium Carbonate Antacid (TUMS PO) Take 1 tablet by mouth daily as needed (for heartburn).    [provider]  clopidogrel (PLAVIX) 75 MG tablet Take 1 tablet (75 mg total) by mouth daily. 04/29/23   Setzer, Lynnell Jude, PA-C  famotidine (PEPCID) 20 MG tablet Take 1 tablet (20 mg total) by mouth daily. 04/29/23   Setzer, Lynnell Jude, PA-C  fluticasone (FLONASE) 50 MCG/ACT nasal spray Place 2 sprays into both nostrils  daily. 03/15/23   [provider]  melatonin 3 MG TABS tablet Take 1 tablet (3 mg total) by mouth at bedtime. 04/29/23   Setzer, Lynnell Jude, PA-C  metoprolol tartrate (LOPRESSOR) 25 MG tablet Take 1 tablet (25 mg total) by mouth 2 (two) times daily. 04/29/23   Setzer, Lynnell Jude, PA-C  Naphazoline HCl (CLEAR EYES OP) Place 1 drop into both eyes at bedtime.    [provider]  nitroGLYCERIN (NITROSTAT) 0.4 MG SL tablet Place 1 tablet (0.4 mg total) under the tongue every 5 (five) minutes as needed for chest pain. 04/01/23   Nahser, Deloris Ping, MD  polyethylene glycol powder (GLYCOLAX/MIRALAX) 17 GM/SCOOP powder Take 17 g by mouth daily. 04/29/23   Setzer, Lynnell Jude, PA-C  polyvinyl alcohol (LIQUIFILM TEARS) 1.4 % ophthalmic solution Place 2 drops into the right eye 4 (four) times daily. 04/29/23   Setzer, Lynnell Jude, PA-C  rosuvastatin (CRESTOR) 20 MG tablet Take 1 tablet (20 mg total) by mouth daily with supper. 04/29/23   Setzer, Lynnell Jude, PA-C  senna-docusate (SENOKOT-S) 8.6-50 MG tablet Take 2 tablets by mouth 2 (two) times daily. 04/29/23   Setzer, Lynnell Jude, PA-C  sodium chloride (OCEAN) 0.65 % nasal spray Place 1 spray into the nose as needed for congestion.    [provider]  tamsulosin (FLOMAX) 0.4 MG CAPS capsule Take 1 capsule (0.4 mg total) by mouth daily after supper. 04/29/23   Setzer, Lynnell Jude, PA-C  Allergies    Codeine    Review of Systems   Review of Systems  Respiratory:  Positive for cough and shortness of breath.   Cardiovascular:  Negative for chest pain.  All other systems reviewed and are negative.   Physical Exam Updated Vital Signs BP 121/75   Pulse 62   Temp 99.7 F (37.6 C)   Resp (!) 26   Ht 5\' 7"  (1.702 m)   Wt 55.9 kg   SpO2 91%   BMI 19.30 kg/m  Physical Exam Vitals and nursing note reviewed.  Constitutional:      General: He is not in acute distress.    Appearance: He is well-developed.  HENT:     Head: Normocephalic and  atraumatic.  Eyes:     Conjunctiva/sclera: Conjunctivae normal.  Cardiovascular:     Rate and Rhythm: Normal rate and regular rhythm.     Heart sounds: No murmur heard. Pulmonary:     Effort: Pulmonary effort is normal. No respiratory distress.     Breath sounds: Normal breath sounds.  Abdominal:     Palpations: Abdomen is soft.     Tenderness: There is no abdominal tenderness.  Musculoskeletal:        General: No swelling.     Cervical back: Neck supple.  Skin:    General: Skin is warm and dry.     Capillary Refill: Capillary refill takes less than 2 seconds.  Neurological:     Mental Status: He is alert.  Psychiatric:        Mood and Affect: Mood normal.     ED Results / Procedures / Treatments   Labs (all labs ordered are listed, but only abnormal results are displayed) Labs Reviewed  RESP PANEL BY RT-PCR (RSV, FLU A&B, COVID)  [id number] - Abnormal; Notable for the following components:      Result Value   SARS Coronavirus 2 by RT PCR POSITIVE (*)    All other components within normal limits  COMPREHENSIVE METABOLIC PANEL - Abnormal; Notable for the following components:   Sodium 134 (*)    Glucose, Bld 107 (*)    Calcium 8.7 (*)    Albumin 2.7 (*)    All other components within normal limits  CBC - Abnormal; Notable for the following components:   RBC 4.18 (*)    Hemoglobin 12.9 (*)    All other components within normal limits  MAGNESIUM    EKG EKG Interpretation Date/Time:  Sunday May 01 2023 16:54:16 EST Ventricular Rate:  75 PR Interval:  144 QRS Duration:  98 QT Interval:  362 QTC Calculation: 404 R Axis:   -73  Text Interpretation: Normal sinus rhythm Left axis deviation Incomplete right bundle branch block Minimal voltage criteria for LVH, may be normal variant ( Cornell product ) Abnormal ECG When compared with ECG of 07-Apr-2023 23:55, PREVIOUS ECG IS PRESENT Confirmed by Ernie Avena ([id number]) on 05/01/2023 5:44:46 PM  Radiology DG Chest Port 1  View Result Date: 05/01/2023 CLINICAL DATA:  Shortness of breath EXAM: PORTABLE CHEST 1 VIEW COMPARISON:  04/08/2023 FINDINGS: Left chest wall loop recorder. Stable heart size. Aortic atherosclerosis. Hyperinflated lungs with chronically coarsened interstitial markings bilaterally. Multifocal areas of nodularity including within the bilateral upper lobes are similar to the prior study. No pleural effusion or pneumothorax. IMPRESSION: Chronic fibrotic-appearing changes of both lungs. Multifocal areas of nodularity including within the bilateral upper lobes are similar to the prior study and could be relating to scarring/fibrosis, infection, or pulmonary  nodules. Consider chest CT if further evaluation is clinically warranted. Electronically Signed   By: Duanne Guess D.O.   On: 05/01/2023 17:45    Procedures Procedures    Medications Ordered in ED Medications - No data to display  ED Course/ Medical Decision Making/ A&P Clinical Course as of 05/01/23 1943  Sun May 01, 2023  1820 SARS Coronavirus 2 by RT PCR(!): POSITIVE [JL]    Clinical Course User Index [JL] Ernie Avena, MD                                 Medical Decision Making Amount and/or Complexity of Data Reviewed Labs: ordered. Decision-making details documented in ED Course. Radiology: ordered.  Risk Prescription drug management.    89 year old male with medical history significant for hypertension, prior left frontoparietal CVA, bronchiectasis and pulmonary fibrosis, presenting to the emergency department with symptoms of COVID.  The patient had been feeling unwell since his discharge from from the hospital on 12/13.  He developed a productive cough last night.  He tested positive for COVID-19 at home earlier today.  He has been feeling unwell and has not been tolerating oral intake.  Endorses worsening fatigue.  He denies any chest pain.  He continues to have baseline left lower extremity weakness in the setting of his  prior stroke.  No new weakness noticed that is focal.  No vomiting, no abdominal pain.  On arrival, the patient Was vitally stable, afebrile, not tachycardic or tachypneic, saturating 98% on room air, BP 139/66.  Physical exam generally unremarkable.  Patient had his baseline from a neurologic standpoint.  Is not altered.  Has had decreased p.o. intake in the setting of COVID-19 infection.  Not hypoxic, no oxygen requirement.  COVID-19 PCR testing was collected and resulted positive.  Laboratory evaluation obtained revealed CBC with no leukocytosis, mild anemia to 12.9, close to the patient's baseline, CMP without significant electrolyte abnormality, magnesium normal.  Chest x-ray was performed which revealed fibrotic lungs which is known.  An EKG Given the patient's fatigue and weakness revealed normal sinus rhythm, ventricular rate 75, LVH present, no acute ischemic changes.  The patient was p.o. challenged and was successfully tolerating oral intake.  He does not have an oxygen requirement.  I considered admission for observation in the setting of the patient's fatigue and weakness versus discharge home and family would prefer to be discharged home at this time.  Return precautions were provided in the event of any severe worsening symptoms, stable for discharge.    Final Clinical Impression(s) / ED Diagnoses Final diagnoses:  COVID-19  Weakness  Fatigue, unspecified type    Rx / DC Orders ED Discharge Orders          Ordered    benzonatate (TESSALON) 100 MG capsule  Every 8 hours        05/01/23 1936              Ernie Avena, MD 05/01/23 Vinnie Langton    Ernie Avena, MD 05/01/23 1943

## 2023-05-06 NOTE — Progress Notes (Signed)
Patient ID: Samuel Mata, male   DOB: 1931-05-10, 89 y.o.   MRN: [id number]  Sw received notification that family is currently is the process of setting up patient a ALF. FL2 requested. Sw will follow up with CIR physician to obtain signature or direct family to PCP. Based on discussion with physician, SW will have family reach out to PCP to obtain FL2.

## 2023-05-06 NOTE — [place] FL2 (Signed)
[place] MEDICAID [id number] LEVEL OF CARE FORM     IDENTIFICATION  Patient Name: Samuel Mata Birthdate: 1931-04-30 Sex: male Admission Date (Current Location): 04/12/2023  [facility] and [place] Number:  [place] N/A Facility and Address:  The [place], [address], [place], [place] [address]      Provider Number:    Attending Physician Name and Address:  No att. 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   Medication monitoring encounter 03/07/2020   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   Bronchiectasis without complication (HCC) 11/22/2019   Hereditary spherocytosis (HCC) 08/29/2019   Seborrheic keratoses 08/29/2019   OSA (obstructive sleep apnea) 03/19/2019   History of TIA (transient ischemic attack) 01/23/2018   Hemoptysis 07/15/2017   Pulmonary infiltrates 07/15/2017   History of tobacco use 06/06/2017   Essential hypertension 09/22/2015   Gastroesophageal reflux disease without esophagitis 06/05/2014   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   Chronic obstructive pulmonary disease (HCC) 08/14/2013   Chronic rhinitis 08/14/2013   Hearing loss of both ears 08/14/2013    Orientation RESPIRATION BLADDER Height & Weight     Self, Situation, Time, Place  Normal Incontinent Weight: 123 lb 3.8 oz (55.9 kg) Height:  5\' 7"  (170.2 cm)  BEHAVIORAL SYMPTOMS/MOOD NEUROLOGICAL BOWEL  NUTRITION STATUS      Incontinent Diet  AMBULATORY STATUS COMMUNICATION OF NEEDS Skin   Supervision Verbally Normal                       Personal Care Assistance Level of Assistance  Bathing, Feeding, Dressing Bathing Assistance: Limited assistance Feeding assistance: Limited assistance Dressing Assistance: Limited assistance     Functional Limitations Info             SPECIAL CARE FACTORS FREQUENCY  PT (By licensed PT), OT (By licensed OT), Speech therapy     PT Frequency: 5x a week OT Frequency: 5x a week     Speech Therapy Frequency: 5x a week      Contractures Contractures Info: Not present    Additional Factors Info                  Current Medications (05/06/2023):  This is the current hospital active medication list No current facility-administered medications for this encounter.   Current Outpatient Medications  Medication Sig Dispense Refill   acetaminophen (TYLENOL) 325 MG tablet Take 650 mg by mouth as needed for headache.     amLODipine (NORVASC) 5 MG tablet Take 1 tablet (5 mg total) by mouth daily. 30 tablet 0   benzonatate (TESSALON) 100 MG capsule Take 1 capsule (100 mg total) by mouth every 8 (eight) hours. 21 capsule 0   Calcium Carbonate Antacid (TUMS PO) Take 1 tablet by mouth daily as needed (for heartburn).     clopidogrel (PLAVIX) 75 MG tablet Take 1 tablet (75 mg total) by mouth daily. 30 tablet 0  famotidine (PEPCID) 20 MG tablet Take 1 tablet (20 mg total) by mouth daily. 30 tablet 0   fluticasone (FLONASE) 50 MCG/ACT nasal spray Place 2 sprays into both nostrils daily.     melatonin 3 MG TABS tablet Take 1 tablet (3 mg total) by mouth at bedtime. 30 tablet 0   metoprolol tartrate (LOPRESSOR) 25 MG tablet Take 1 tablet (25 mg total) by mouth 2 (two) times daily. 60 tablet 0   Naphazoline HCl (CLEAR EYES OP) Place 1 drop into both eyes at bedtime.     nitroGLYCERIN (NITROSTAT) 0.4 MG SL tablet Place 1 tablet (0.4 mg total) under  the tongue every 5 (five) minutes as needed for chest pain. 25 tablet 4   polyethylene glycol powder (GLYCOLAX/MIRALAX) 17 GM/SCOOP powder Take 17 g by mouth daily. 476 g 0   polyvinyl alcohol (LIQUIFILM TEARS) 1.4 % ophthalmic solution Place 2 drops into the right eye 4 (four) times daily. 15 mL 0   rosuvastatin (CRESTOR) 20 MG tablet Take 1 tablet (20 mg total) by mouth daily with supper. 30 tablet 0   senna-docusate (SENOKOT-S) 8.6-50 MG tablet Take 2 tablets by mouth 2 (two) times daily. 120 tablet 0   sodium chloride (OCEAN) 0.65 % nasal spray Place 1 spray into the nose as needed for congestion.     tamsulosin (FLOMAX) 0.4 MG CAPS capsule Take 1 capsule (0.4 mg total) by mouth daily after supper. 30 capsule 0     Discharge Medications: Please see discharge summary for a list of discharge medications.  Relevant Imaging Results:  Relevant Lab Results:   Additional Information    [name]

## 2023-05-09 ENCOUNTER — Encounter: Payer: Medicare HMO | Admitting: Registered Nurse

## 2023-05-09 ENCOUNTER — Ambulatory Visit (INDEPENDENT_AMBULATORY_CARE_PROVIDER_SITE_OTHER): Payer: Medicare HMO

## 2023-05-09 DIAGNOSIS — I639 Cerebral infarction, unspecified: Secondary | ICD-10-CM | POA: Diagnosis not present

## 2023-05-10 LAB — CUP PACEART REMOTE DEVICE CHECK
Date Time Interrogation Session: 20241222231317
Implantable Pulse Generator Implant Date: 20220901

## 2023-05-17 NOTE — Progress Notes (Signed)
 Subjective:    Patient ID: Samuel Mata, male    DOB: 1930/07/07, 89 y.o.   MRN: [id number]  HPI: Samuel Mata is a 89 y.o. male who is here for HFU appointment for F/U of his Right Thalamic Stroke and Essential Hypertension.   He arrived to [facility] with complaints of left lower extremity weakness on 04/07/2023  Samuel Samuel H&P:  Mata Complaint: Leg weakness, left   HPI: Samuel Mata is a 89 y.o. male with medical history significant of COPD, hypertension, GERD, hereditary spherocytosis, sleep apnea who presents with left leg weakness.  No other acute complaints or recent issues, imaging in the Mata confirmed acute CVA, neurology consulted and hospitalist called for admission.  CT Head WO Contrast: CTA IMPRESSION: CT HEAD:   1. No acute intracranial abnormality. 2. 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   CTA HEAD AND NECK:   1. Negative CTA for large vessel occlusion or other emergent finding. 2. Moderate bilateral V1 stenoses. 3. Mild atheromatous change about the carotid bifurcations and carotid siphons without hemodynamically significant stenosis. 4. Multifocal patchy and nodular densities within the visualized lungs, nonspecific, and could be either infectious or inflammatory in nature. Findings are incompletely assessed on this exam, and correlation with dedicated chest CT recommended. 5.  Aortic Atherosclerosis (ICD10-I70.0).   MR Brain: WO Contrast:  MPRESSION: 1. 9 mm acute ischemic nonhemorrhagic right thalamocapsular infarct. 2. Underlying age-related cerebral atrophy with moderate chronic microvascular ischemic disease, with a few small remote bilateral cerebellar infarcts.  Samuel Mata: 04/09/2023 Acute ischemic right thalamocapsular infarct, etiology small vessel disease CT No acute intracranial abnormality. Small remote cerebellar infarct.    CTA head & neck No LVO. Moderate bilateral V1 stenosis.   MRI 9 mm acute ischemic nonhemorrhagic right thalamocapsular infarct 2D Echo EF 55 to 60%, normal left atrial size, no interatrial shunt Loop recorder in place, negative for A-fib on last interrogation 04/03/2023 LDL 98 HgbA1c 5.4 UDS negative  VTE prophylaxis - Heparin  subq aspirin  81 mg daily prior to admission, now on aspirin  81 mg daily and clopidogrel  75 mg daily for 3 weeks and then Plavix  alone. Therapy recommendations:  SNF Disposition:  pending   Hx of Stroke/TIA Remote of cerebellar stroke 01/2021 admitted for aphasia, CT no acute abnormality.  Status post TNK.  CTA head and neck showed no LVO, right CCA/ICA dissection.  MRI showed left perirolandic punctate infarct.  Status post loop recorder.  Discharged on DAPT for 3 months. Loop recorder in place, negative for afib on last interrogation 11/17   Hypertension Home meds:  Metoprolol  succinate 25 mg daily  BP stable For BP goal normotensive   Hyperlipidemia Home meds:  Lipitor  40 mg daily  LDL 98, goal < 70 Increased to 80 mg daily  Continue statin at discharge   Other Stroke Risk Factors ETOH use, alcohol  level <10, advised to drink no more than 2 drink(s) a day Former smoker   Other Active Problems BPH  Chest pain, evaluated by cards outpatient on 11/15, home meds: metoprolol  and nitroglycerin  prn   Samuel Mata was admitted to inpatient rehabilitation on 04/12/2023 and discharged home on 04/29/2023. He is receiving Home Health Therapy with [facility]. He denies any pain. He rates his pain 0.    Pain Inventory Average Pain 0 Pain Right Now 0 My pain is  No pain  LOCATION OF PAIN  no pain  BOWEL Number of stools per week: 5 Oral laxative use Yes  Type of laxatives Senokot  Enema or suppository use No  History of colostomy No  Incontinent No   BLADDER Normal  Frequent urination Yes     Mobility use a walker how many minutes can you walk? yes ability to climb steps?  yes do you drive?  no Do  you have any goals in this area?  yes  Function retired I need assistance with the following:  bathing, meal prep, household duties, and shopping Do you have any goals in this area?  yes  Neuro/Psych weakness trouble walking  Prior Studies Any changes since last visit?  no  Physicians involved in your care Any changes since last visit?  no   Family History  Problem Relation Age of Onset   Lung disease Neg Hx    Social History   Socioeconomic History   Marital status: Married    Spouse name: [name]   Number of children: Not on file   Years of education: Not on file   Highest education level: Not on file  Occupational History   Not on file  Tobacco Use   Smoking status: Former    Current packs/day: 0.00    Average packs/day: 1 pack/day for 30.0 years (30.0 ttl pk-yrs)    Types: Cigarettes    Start date: 10/08/1954    Quit date: 10/07/1984    Years since quitting: 38.6   Smokeless tobacco: Never  Substance and Sexual Activity   Alcohol  use: Never   Drug use: Never   Sexual activity: Not on file  Other Topics Concern   Not on file  Social History Narrative   Lives with wife   Social Drivers of Health   Financial Resource Strain: Low Risk  (11/23/2022)   Received from [facility]   Overall Financial Resource Strain (CARDIA)    Difficulty of Paying Living Expenses: Not hard at all  Food Insecurity: No Food Insecurity (04/08/2023)   Hunger Vital Sign    Worried About Running Out of Food in the Last Year: Never true    Ran Out of Food in the Last Year: Never true  Transportation Needs: No Transportation Needs (04/08/2023)   PRAPARE - Administrator, Civil Service (Medical): No    Lack of Transportation (Non-Medical): No  Physical Activity: Insufficiently Active (11/23/2022)   Received from [facility]   Exercise Vital Sign    Days of Exercise per Week: 2 days    Minutes of Exercise per Session: 20 min  Stress: No Stress Concern Present (11/23/2022)    Received from [facility] - Occupational Stress Questionnaire    Feeling of Stress : Not at all  Social Connections: Socially Integrated (11/23/2022)   Received from [facility]   Social Network    How would you rate your social network (family, work, friends)?: Good participation with social networks   Past Surgical History:  Procedure Laterality Date   APPENDECTOMY     CHOLECYSTECTOMY     LOOP RECORDER INSERTION N/A 01/15/2021   Procedure: LOOP RECORDER INSERTION;  Surgeon: Kelsie Agent, MD;  Location: [facility] INVASIVE CV LAB;  Service: Cardiovascular;  Laterality: N/A;   NOSE SURGERY     SPINE SURGERY     compressoin fx T12, kyphoplasty   SPLENECTOMY, TOTAL     Past Medical History:  Diagnosis Date   Bronchiectasis without complication (HCC)    Enlarged prostate    Hearing loss    Hypertension  Pulmonary nocardiosis (HCC) 2021   treated with 6 month antibiotic course   Renal stones    Stroke (HCC) 2022   left frontoparietal   TIA (transient ischemic attack) 2019   There were no vitals taken for this visit.  Opioid Risk Score:   Fall Risk Score:  `1  Depression screen [facility] 2/9     03/12/2021    5:50 PM 07/22/2020   10:08 AM 06/17/2020    3:07 PM 02/19/2020    1:59 PM  Depression screen PHQ 2/9  Decreased Interest 0 0 0 0  Down, Depressed, Hopeless 0 0 0 0  PHQ - 2 Score 0 0 0 0    Review of Systems  Respiratory:  Positive for cough and shortness of breath.   Musculoskeletal:  Positive for gait problem.  Neurological:  Positive for weakness.  All other systems reviewed and are negative.      Objective:   Physical Exam Vitals and nursing Mata reviewed.  Constitutional:      Appearance: Normal appearance.  Cardiovascular:     Rate and Rhythm: Normal rate and regular rhythm.     Pulses: Normal pulses.     Heart sounds: Normal heart sounds.  Pulmonary:     Effort: Pulmonary effort is normal.     Breath sounds:  Normal breath sounds.  Musculoskeletal:     Comments: Normal Muscle Bulk and Muscle Testing Reveals:  Upper Extremities: Full ROM and Muscle Strength 5/5 Lower Extremities : Full ROM and Muscle Strength 5/5 Arises from Table slowly using walker for support Narrow Based Gait     Skin:    General: Skin is warm and dry.  Neurological:     Mental Status: He is alert and oriented to person, place, and time.  Psychiatric:        Mood and Affect: Mood normal.        Behavior: Behavior normal.         Assessment & Plan:  Right Thalamic Stroke: He has a scheduled appointment with [facility] on 06/09/2023. Continue Home Health Therapy at [facility].  Essential Hypertension.: Continue Current medication regimen: PCP Following . Continue to monitor.   F/U with Samuel Carilyn in 4- 6 weeks     Daughter in room

## 2023-05-19 ENCOUNTER — Encounter: Payer: Medicare HMO | Attending: Registered Nurse | Admitting: Registered Nurse

## 2023-05-19 ENCOUNTER — Encounter: Payer: Self-pay | Admitting: Registered Nurse

## 2023-05-19 VITALS — BP 111/68 | HR 74 | Ht 67.0 in | Wt 132.0 lb

## 2023-05-19 DIAGNOSIS — I6381 Other cerebral infarction due to occlusion or stenosis of small artery: Secondary | ICD-10-CM

## 2023-05-19 DIAGNOSIS — I10 Essential (primary) hypertension: Secondary | ICD-10-CM

## 2023-05-27 ENCOUNTER — Other Ambulatory Visit (HOSPITAL_COMMUNITY): Payer: Self-pay

## 2023-05-27 ENCOUNTER — Other Ambulatory Visit (HOSPITAL_BASED_OUTPATIENT_CLINIC_OR_DEPARTMENT_OTHER): Payer: Self-pay

## 2023-05-27 MED ORDER — FAMOTIDINE 20 MG PO TABS
20.0000 mg | ORAL_TABLET | Freq: Every day | ORAL | 0 refills | Status: DC
  Filled 2023-05-27 (×2): qty 30, 30d supply, fill #0

## 2023-05-27 MED ORDER — AMLODIPINE BESYLATE 5 MG PO TABS
5.0000 mg | ORAL_TABLET | Freq: Every day | ORAL | 0 refills | Status: DC
  Filled 2023-05-27 (×2): qty 30, 30d supply, fill #0

## 2023-05-27 MED ORDER — CLOPIDOGREL BISULFATE 75 MG PO TABS
75.0000 mg | ORAL_TABLET | Freq: Every day | ORAL | 0 refills | Status: DC
  Filled 2023-05-27 (×2): qty 30, 30d supply, fill #0

## 2023-05-27 MED ORDER — TAMSULOSIN HCL 0.4 MG PO CAPS
0.4000 mg | ORAL_CAPSULE | Freq: Every day | ORAL | 0 refills | Status: DC
  Filled 2023-05-27 (×2): qty 30, 30d supply, fill #0

## 2023-05-27 MED ORDER — ROSUVASTATIN CALCIUM 20 MG PO TABS
20.0000 mg | ORAL_TABLET | Freq: Every day | ORAL | 0 refills | Status: DC
  Filled 2023-05-27 (×2): qty 30, 30d supply, fill #0

## 2023-05-30 ENCOUNTER — Other Ambulatory Visit (HOSPITAL_BASED_OUTPATIENT_CLINIC_OR_DEPARTMENT_OTHER): Payer: Self-pay

## 2023-06-02 ENCOUNTER — Other Ambulatory Visit (HOSPITAL_BASED_OUTPATIENT_CLINIC_OR_DEPARTMENT_OTHER): Payer: Self-pay

## 2023-06-06 ENCOUNTER — Other Ambulatory Visit (HOSPITAL_BASED_OUTPATIENT_CLINIC_OR_DEPARTMENT_OTHER): Payer: Self-pay

## 2023-06-07 ENCOUNTER — Telehealth: Payer: Self-pay | Admitting: Adult Health

## 2023-06-07 NOTE — Telephone Encounter (Signed)
[name] daughter, Jahseh Stille called  to verify pt's appointment

## 2023-06-07 NOTE — Telephone Encounter (Signed)
LVM and sent mychart msg informing pt of need to reschedule 06/09/23 appt - NP out

## 2023-06-09 ENCOUNTER — Inpatient Hospital Stay: Payer: Medicare HMO | Admitting: Adult Health

## 2023-06-09 NOTE — Telephone Encounter (Signed)
Reschedule appt on 07/12/23 at 10:15 am

## 2023-06-13 ENCOUNTER — Ambulatory Visit (INDEPENDENT_AMBULATORY_CARE_PROVIDER_SITE_OTHER): Payer: Medicare HMO

## 2023-06-13 DIAGNOSIS — I639 Cerebral infarction, unspecified: Secondary | ICD-10-CM | POA: Diagnosis not present

## 2023-06-13 LAB — CUP PACEART REMOTE DEVICE CHECK
Date Time Interrogation Session: 20250126231001
Implantable Pulse Generator Implant Date: 20220901

## 2023-06-17 ENCOUNTER — Encounter: Payer: Self-pay | Admitting: Cardiovascular Disease

## 2023-06-20 NOTE — Addendum Note (Signed)
Addended by: Geralyn Flash D on: 06/20/2023 11:45 AM   Modules accepted: Orders

## 2023-06-20 NOTE — Progress Notes (Signed)
 [facility] Summary Report / Loop Recorder

## 2023-06-23 ENCOUNTER — Encounter: Payer: Medicare HMO | Admitting: Physical Medicine & Rehabilitation

## 2023-06-23 ENCOUNTER — Encounter: Payer: Self-pay | Admitting: Cardiovascular Disease

## 2023-06-23 NOTE — Progress Notes (Signed)
  Cardiology Office Note:  .   Date:  06/24/2023  ID:  Samuel Mata, DOB 10/06/1930, MRN [id number] PCP: [facility] Providers Cardiologist:  None    History of Present Illness: .   Samuel Mata is a 89 y.o. male with hx of stroke , HLD ,HTN,  recent chest pain   Seen with daughter, Samuel Mata   Has been having some chest pain  Soreness  He thought he may have pulled some muscles  He was at his ENT appt and reported having some chest pain  Walks 20 minutes a day  Exercises in the morning when he first get up in the morning  Thinks he may have pulled something    [facility] by [facility] Has an implantable loop recorder   Is retired from audiological scientist estate business    Feb. 7, 2025 Samuel Mata is seen for follow up. Seen with daughter, Samuel Mata   He was on plavix  following his stroke .  He was having some nose bleeds .  He wants to stop the plavix  due to the nose bleeds.   Hx of significant dementia I saw him several months ago with dementia and CP  We added NTG PRN and metoporolol   Daughter , Samuel Mata thinks his previous CP was a MSK discomfort  He has had a small stroke since I last saw him Was at [facility] for a month Will refill NTG   He has 1 + leg edema  I hesitate to add an diuretics  He walks with a walker ,   He does not want to wear compression hose      ROS:   Studies Reviewed: SABRA   EKG Interpretation Date/Time:  Friday June 24 2023 10:48:31 EST Ventricular Rate:  58 PR Interval:  178 QRS Duration:  102 QT Interval:  424 QTC Calculation: 416 R Axis:   -50  Text Interpretation: Sinus bradycardia Left axis deviation Moderate voltage criteria for LVH, may be normal variant ( R in aVL , Cornell product ) Nonspecific ST abnormality When compared with ECG of 01-May-2023 16:54, T wave inversion more evident in Inferior leads Confirmed by Samuel Mata ([id number]) on 06/24/2023 11:09:49 AM     Risk  Assessment/Calculations:     Physical Exam:    Physical Exam: Blood pressure 134/74, pulse (!) 56, height 5' 7 (1.702 m), weight 136 lb 6.4 oz (61.9 kg), SpO2 96%.       GEN:  Well nourished, well developed in no acute distress HEENT: Normal NECK: No JVD; No carotid bruits LYMPHATICS: No lymphadenopathy CARDIAC: RRR , no murmurs, rubs, gallops RESPIRATORY:  Clear to auscultation without rales, wheezing or rhonchi  ABDOMEN: Soft, non-tender, non-distended MUSCULOSKELETAL:  No edema; No deformity  SKIN: Warm and dry NEUROLOGIC:  Alert and oriented x 3    ASSESSMENT AND PLAN: .   Chest discomfort:  His daughter Samuel Mata thinks that his episodes of chest pain are likely musculoskeletal issue.  He has not needed any nitroglycerin ..  Overall he seems to be doing well.  He will see us  on an as-needed basis.            Signed, Mata Alveta, MD

## 2023-06-24 ENCOUNTER — Ambulatory Visit: Payer: Medicare HMO | Attending: Cardiovascular Disease | Admitting: Cardiovascular Disease

## 2023-06-24 VITALS — BP 134/74 | HR 56 | Ht 67.0 in | Wt 136.4 lb

## 2023-06-24 DIAGNOSIS — I7771 Dissection of carotid artery: Secondary | ICD-10-CM

## 2023-06-24 NOTE — Patient Instructions (Signed)
 Follow-Up: At [facility], you and your health needs are our priority.  As part of our continuing mission to provide you with exceptional heart care, we have created designated Provider Care Teams.  These Care Teams include your primary Cardiologist (physician) and Advanced Practice Providers (APPs -  Physician Assistants and Nurse Practitioners) who all work together to provide you with the care you need, when you need it.  We recommend signing up for the patient portal called "MyChart".  Sign up information is provided on this After Visit Summary.  MyChart is used to connect with patients for Virtual Visits (Telemedicine).  Patients are able to view lab/test results, encounter notes, upcoming appointments, etc.  Non-urgent messages can be sent to your provider as well.   To learn more about what you can do with MyChart, go to [email or web address].    Your next appointment:   As Needed  Provider:   [name], MD   1st Floor: - Lobby - Registration  - Pharmacy  - Lab - Cafe  2nd Floor: - PV Lab - Diagnostic Testing (echo, CT, nuclear med)  3rd Floor: - Vacant  4th Floor: - TCTS (cardiothoracic surgery) - AFib Clinic - Structural Heart Clinic - Vascular Surgery  - Vascular Ultrasound  5th Floor: - HeartCare Cardiology (general and EP) - Clinical Pharmacy for coumadin, hypertension, lipid, weight-loss medications, and med management appointments    Valet parking services will be available as well.

## 2023-07-01 ENCOUNTER — Encounter: Payer: Medicare HMO | Attending: Physical Medicine & Rehabilitation | Admitting: Physical Medicine & Rehabilitation

## 2023-07-01 ENCOUNTER — Encounter: Payer: Self-pay | Admitting: Physical Medicine & Rehabilitation

## 2023-07-01 VITALS — BP 129/73 | HR 75 | Ht 67.0 in | Wt 142.0 lb

## 2023-07-01 DIAGNOSIS — I6381 Other cerebral infarction due to occlusion or stenosis of small artery: Secondary | ICD-10-CM | POA: Insufficient documentation

## 2023-07-01 DIAGNOSIS — R269 Unspecified abnormalities of gait and mobility: Secondary | ICD-10-CM | POA: Diagnosis not present

## 2023-07-01 DIAGNOSIS — I69398 Other sequelae of cerebral infarction: Secondary | ICD-10-CM | POA: Insufficient documentation

## 2023-07-01 NOTE — Progress Notes (Signed)
Subjective:    Patient ID: Samuel Mata, male    DOB: [birth year], 89 y.o.   MRN: [id number] He was maintained on aspirin through 12/13 and is to continue Plavix indefinitely or as advised by neurology.   89 y.o. right-handed male with history of HTN, COPD, left frontoparietal stroke who was admitted on 04/07/2023 with reports of progressive LLE weakness x 24 hours as well as family reports of gradual cognitive decline.  CTA head was negative for LVO.  MRI brain done revealing 9 mm acute ischemic right thalamic capsular infarct with moderate chronic microvascular disease.  Loop recorder was interrogated and showed no evidence of A-fib.  Dr. Pearlean Brownie felt the stroke was due to small vessel disease and recommended DAPT x 3 weeks followed by Plavix alone.  He did have some weakness in LUE on 11/25 with repeat CT head showing evolution of stroke and was negative for bleed.  Therapy was working with patient who was limited by left-sided neglect with left lean, balance deficits and flexed posture.  He is requiring min to mod assist overall and was independent prior to admission.  CIR was recommended due to functional decline.  Admit date: 04/12/2023 Discharge date: 04/29/2023 HPI  Decline from Covid after he came home from the hospital  [facility] 3 x per day for 1 month   Walked with a cane prior to stroke but his home health therapist stated that he will be walking with a walker from now 1  No falls   ModI dressing and bathing Pain Inventory Average Pain 0 Pain Right Now 0 My pain is  not in any pain today    Family History  Problem Relation Age of Onset   Lung disease Neg Hx    Social History   Socioeconomic History   Marital status: Married    Spouse name: [name]   Number of children: Not on file   Years of education: Not on file   Highest education level: Not on file  Occupational History   Not on file  Tobacco Use   Smoking status: Former    Current packs/day:  0.00    Average packs/day: 1 pack/day for 30.0 years (30.0 ttl pk-yrs)    Types: Cigarettes    Start date: 10/08/1954    Quit date: 10/07/1984    Years since quitting: 38.7   Smokeless tobacco: Never  Vaping Use   Vaping status: Not on file  Substance and Sexual Activity   Alcohol use: Never   Drug use: Never   Sexual activity: Not on file  Other Topics Concern   Not on file  Social History Narrative   Lives with wife   Social Drivers of Health   Financial Resource Strain: Low Risk  (11/23/2022)   Received from [facility]   Overall Financial Resource Strain (CARDIA)    Difficulty of Paying Living Expenses: Not hard at all  Food Insecurity: No Food Insecurity (04/08/2023)   Hunger Vital Sign    Worried About Running Out of Food in the Last Year: Never true    Ran Out of Food in the Last Year: Never true  Transportation Needs: No Transportation Needs (04/08/2023)   PRAPARE - Administrator, Civil Service (Medical): No    Lack of Transportation (Non-Medical): No  Physical Activity: Insufficiently Active (11/23/2022)   Received [facility]   Exercise Vital Sign    Days of Exercise per Week: 2 days    Minutes of Exercise  per Session: 20 min  Stress: No Stress Concern Present (11/23/2022)   Received from [facility] - Occupational Stress Questionnaire    Feeling of Stress : Not at all  Social Connections: Socially Integrated (11/23/2022)   Received from [facility]   Social Network    How would you rate your social network (family, work, friends)?: Good participation with social networks   Past Surgical History:  Procedure Laterality Date   APPENDECTOMY     CHOLECYSTECTOMY     LOOP RECORDER INSERTION N/A 01/15/2021   Procedure: LOOP RECORDER INSERTION;  Surgeon: Hillis Range, MD;  Location: [facility] INVASIVE CV LAB;  Service: Cardiovascular;  Laterality: N/A;   NOSE SURGERY     SPINE SURGERY     compressoin fx T12,  kyphoplasty   SPLENECTOMY, TOTAL     Past Surgical History:  Procedure Laterality Date   APPENDECTOMY     CHOLECYSTECTOMY     LOOP RECORDER INSERTION N/A 01/15/2021   Procedure: LOOP RECORDER INSERTION;  Surgeon: Hillis Range, MD;  Location: [facility] INVASIVE CV LAB;  Service: Cardiovascular;  Laterality: N/A;   NOSE SURGERY     SPINE SURGERY     compressoin fx T12, kyphoplasty   SPLENECTOMY, TOTAL     Past Medical History:  Diagnosis Date   Bronchiectasis without complication (HCC)    Enlarged prostate    Hearing loss    Hypertension    Pulmonary nocardiosis (HCC) 2021   treated with 6 month antibiotic course   Renal stones    Stroke (HCC) 2022   left frontoparietal   TIA (transient ischemic attack) 2019   BP 129/73   Pulse 75   Ht 5\' 7"  (1.702 m)   Wt 142 lb (64.4 kg)   SpO2 91%   BMI 22.24 kg/m   Opioid Risk Score:   Fall Risk Score:  `1  Depression screen [facility] 2/9     07/01/2023   12:58 PM 05/19/2023    2:27 PM 03/12/2021    5:50 PM 07/22/2020   10:08 AM 06/17/2020    3:07 PM 02/19/2020    1:59 PM  Depression screen PHQ 2/9  Decreased Interest 0 0 0 0 0 0  Down, Depressed, Hopeless 0 0 0 0 0 0  PHQ - 2 Score 0 0 0 0 0 0  Altered sleeping  0      Tired, decreased energy  0      Change in appetite  0      Trouble concentrating  1      Moving slowly or fidgety/restless  0      Suicidal thoughts  0      PHQ-9 Score  1          Review of Systems     Objective:   Physical Exam General No acute distress Mood and affect appropriate Motor strength is 5 -/5 bilateral deltoid, bicep, tricep, grip, hip flexion, knee extensor, ankle dorsiflexor and plantar flexor Negative straight leg raise bilaterally Finger-nose-finger testing is intact bilaterally Full range of motion bilateral shoulders Finger thumb opposition intact bilaterally Extremities 3+ edema left pretibial, 2+ edema right pretibial There is no evidence of respiratory distress or wheezing Ambulates with  a walker no evidence of toe drag or knee instability   Sensation is reported is equal bilateral upper and lower limbs    Assessment & Plan:   63.  89 year old male with recent right thalamic capsular infarct excellent recovery almost  back at baseline he has some chronic balance issues and will need to continue walking with walker rather than going back to his cane.  I do not see need for outpatient therapy at the current time given his recovery.  He will follow-up with neurology as well as primary care Physical medicine rehab follow-up on as-needed basis

## 2023-07-11 NOTE — Progress Notes (Unsigned)
 [facility] [address] [place]. [place] [address] [phone]       STROKE FOLLOW UP NOTE  Samuel Mata Date of Birth:  [birth year] Medical Record Number:  [id number]   Reason for Referral: stroke follow up    SUBJECTIVE:   CHIEF COMPLAINT:  No chief complaint on file.   HPI:   Update 08/09/2023 JM: Patient returns for follow-up visit for hospital follow-up.  He was previously seen almost 2 years ago for stroke follow-up and as he was doing well, he was advised to follow-up as needed.  Unfortunately, he presented to ED on 04/07/2023 with left leg weakness over the past couple of days and gradually worsened.  Stroke workup revealed acute right thalamocapsular infarct secondary to small vessel disease.  CTA head/neck negative LVO.  EF 55 to 60%.  Loop recorder negative for A-fib.  LDL 98.  A1c 5.4.  Recommended DAPT for 3 weeks then Plavix alone and increase atorvastatin from 40 mg to 80 mg daily.  He was discharged to [facility] for functional decline.       History provided for reference purposes only Update 09/17/2021 JM: Patient returns for 43-month stroke follow-up.  Overall stable without new or reoccurring stroke/TIA symptoms.  Continued use of cane for ambulation but unfortunately he had a fall on 4/10 after losing his balance (tried to step backwards and turned too quickly in kitchen and fell) with resultant T12 superior endplate compression fracture.  He is currently being followed by Dr. Noel Gerold orthopedics and underwent kyphoplasty 4/28 with excellent improvement of pain.  He has not yet been back to exercising but was cleared to do light walking per wife. He has f/u 5/18 with ortho and possible PT after.  He doesn't feel as steady as he used to be, denies vertigo/dizziness, visual changes, weakness, numbness/tingling or any other stroke/neurological symptoms.  He has not been taking atorvastatin or aspirin since around December as he did not think these were  still needed.  Blood pressure today 132/77.  Loop recorder has not shown atrial fibrillation thus far.  Has not had recent follow-up with PCP.  No new concerns at this time.  Initial visit 03/12/2021 JM: Being seen for initial hospital stroke follow-up accompanied by his wife. Overall doing well. Denies speech, swallowing, weakness or worsening baseline gait impairment since stroke. Recently completed home health therapies.  Continues to use a cane and denies any recent falls.  Denies new stroke/TIA symptoms. Loop recorder has not shown atrial fibrillation thus far.  Remains on aspirin Plavix as well as atorvastatin without side effects.  Blood pressure today 140/82.  Routinely monitors at home and typically stable.  No new concerns at this time.  Stroke admission 01/13/2021 [name] is a 89 y.o. male with history of hypertension, remote cerebellar stroke who presented by EMS on 01/13/2021 for acute onset of aphasia.  Personally reviewed hospitalization pertinent progress notes, lab work and imaging.  NIHSS = 3. CTH no hemorrhage. TNK administered.  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  MRI showed embolic appearing punctate stroke at left periolandic cortex.  Etiology cryptogenic therefore loop recorder placed.  EF 60 to 65%.  LDL 121.  A1c 5.5.  Recommended DAPT for 3 months given right carotid artery dissection and then aspirin alone as well as initiated atorvastatin 40 mg daily.       PERTINENT IMAGING  MR BRAIN WO CONTRAST  01/14/2021 IMPRESSION: 1. Punctate acute infarct in the high left frontoparietal perirolandic cortex. 2. Mild to moderate for age chronic microvascular ischemic disease, atrophy, and chronic left cerebellar infarct. 3. 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  CTA HEAD/NECK 01/13/2021 IMPRESSION: 1. Linear filling defect in the  distal right common carotid artery extending to the proximal internal carotid artery suspicious for acute dissection, with mixing artifact considered less likely though not entirely excluded. Short follow up may be considered to assess for persistence. There is less than 50% narrowing of the true lumen of the common and internal carotid arteries. This result was called by telephone at the time of interpretation on 01/13/2021 at 3:11 pm to provider [facility] , who verbally acknowledged these results. 2. Scattered atherosclerotic disease throughout the remainder of the vasculature of the head and neck with no other significant stenosis, occlusion, dissection, or aneurysm. 3. Scattered nodular opacities throughout both lung apices most suggestive of multifocal infectious/inflammatory etiology. Recommend dedicated imaging of the chest or follow-up CT in 1-3 months to assess for resolution.       ROS:   14 system review of systems performed and negative with exception of those listed in HPI  PMH:  Past Medical History:  Diagnosis Date   Bronchiectasis without complication (HCC)    Enlarged prostate    Hearing loss    Hypertension    Pulmonary nocardiosis (HCC) 2021   treated with 6 month antibiotic course   Renal stones    Stroke (HCC) 2022   left frontoparietal   TIA (transient ischemic attack) 2019    PSH:  Past Surgical History:  Procedure Laterality Date   APPENDECTOMY     CHOLECYSTECTOMY     LOOP RECORDER INSERTION N/A 01/15/2021   Procedure: LOOP RECORDER INSERTION;  Surgeon: Hillis Range, MD;  Location: [facility] INVASIVE CV LAB;  Service: Cardiovascular;  Laterality: N/A;   NOSE SURGERY     SPINE SURGERY     compressoin fx T12, kyphoplasty   SPLENECTOMY, TOTAL      Social History:  Social History   Socioeconomic History   Marital status: Married    Spouse name: [name]   Number of children: Not on file   Years of education: Not on file   Highest education  level: Not on file  Occupational History   Not on file  Tobacco Use   Smoking status: Former    Current packs/day: 0.00    Average packs/day: 1 pack/day for 30.0 years (30.0 ttl pk-yrs)    Types: Cigarettes    Start date: 10/08/1954    Quit date: 10/07/1984    Years since quitting: 38.7   Smokeless tobacco: Never  Vaping Use   Vaping status: Not on file  Substance and Sexual Activity   Alcohol use: Never   Drug use: Never   Sexual activity: Not on file  Other Topics Concern   Not on file  Social History Narrative   Lives with wife   Social Drivers of Health   Financial Resource Strain: Low Risk  (11/23/2022)   Received from [facility]   Overall Financial Resource Strain (CARDIA)    Difficulty of Paying Living Expenses: Not hard at all  Food Insecurity: No Food Insecurity (04/08/2023)   Hunger Vital Sign    Worried About Running Out of Food in the Last Year: Never true    Ran Out of Food in the Last Year: Never true  Transportation Needs:  No Transportation Needs (04/08/2023)   PRAPARE - Administrator, Civil Service (Medical): No    Lack of Transportation (Non-Medical): No  Physical Activity: Insufficiently Active (11/23/2022)   Received from [facility]   Exercise Vital Sign    Days of Exercise per Week: 2 days    Minutes of Exercise per Session: 20 min  Stress: No Stress Concern Present (11/23/2022)   Received from [facility] - Occupational Stress Questionnaire    Feeling of Stress : Not at all  Social Connections: Socially Integrated (11/23/2022)   Received from [facility]   Social Network    How would you rate your social network (family, work, friends)?: Good participation with social networks  Intimate Partner Violence: Not At Risk (04/08/2023)   Humiliation, Afraid, Rape, and Kick questionnaire    Fear of Current or Ex-Partner: No    Emotionally Abused: No    Physically Abused: No    Sexually Abused:  No    Family History:  Family History  Problem Relation Age of Onset   Lung disease Neg Hx     Medications:   Current Outpatient Medications on File Prior to Visit  Medication Sig Dispense Refill   acetaminophen (TYLENOL) 325 MG tablet Take 650 mg by mouth as needed for headache.     amLODipine (NORVASC) 5 MG tablet Take 1 tablet (5 mg total) by mouth daily. 30 tablet 0   Calcium Carbonate Antacid (TUMS PO) Take 1 tablet by mouth daily as needed (for heartburn).     clopidogrel (PLAVIX) 75 MG tablet Take 1 tablet (75 mg total) by mouth daily. 30 tablet 0   famotidine (PEPCID) 20 MG tablet Take 1 tablet (20 mg total) by mouth daily. 30 tablet 0   fluticasone (FLONASE) 50 MCG/ACT nasal spray Place 2 sprays into both nostrils daily.     melatonin 3 MG TABS tablet Take 1 tablet (3 mg total) by mouth at bedtime. 30 tablet 0   Naphazoline HCl (CLEAR EYES OP) Place 1 drop into both eyes at bedtime.     nitroGLYCERIN (NITROSTAT) 0.4 MG SL tablet Place 1 tablet (0.4 mg total) under the tongue every 5 (five) minutes as needed for chest pain. 25 tablet 4   polyethylene glycol powder (GLYCOLAX/MIRALAX) 17 GM/SCOOP powder Take 17 g by mouth daily. 476 g 0   polyvinyl alcohol (LIQUIFILM TEARS) 1.4 % ophthalmic solution Place 2 drops into the right eye 4 (four) times daily. 15 mL 0   rosuvastatin (CRESTOR) 20 MG tablet Take 1 tablet (20 mg total) by mouth daily with supper. 30 tablet 0   senna-docusate (SENOKOT-S) 8.6-50 MG tablet Take 2 tablets by mouth 2 (two) times daily. 120 tablet 0   sodium chloride (OCEAN) 0.65 % nasal spray Place 1 spray into the nose as needed for congestion.     tamsulosin (FLOMAX) 0.4 MG CAPS capsule Take 1 capsule (0.4 mg total) by mouth daily after supper. 30 capsule 0   No current facility-administered medications on file prior to visit.    Allergies:   Allergies  Allergen Reactions   Codeine     Unknown reaction      OBJECTIVE:  Physical Exam  There were  no vitals filed for this visit.   There is no height or weight on file to calculate BMI. No results found.  General: well developed, well nourished, very pleasant elderly Caucasian male, seated, in no evident distress Head: head normocephalic  and atraumatic.   Neck: supple with no carotid or supraclavicular bruits Cardiovascular: regular rate and rhythm, no murmurs Musculoskeletal: no deformity Skin:  no rash/petichiae Vascular:  Normal pulses all extremities   Neurologic Exam Mental Status: Awake and fully alert.  Fluent speech and language.  Oriented to place and time. Recent and remote memory intact. Attention span, concentration and fund of knowledge appropriate. Mood and affect appropriate.  Cranial Nerves: Pupils equal, briskly reactive to light. Extraocular movements full without nystagmus. Visual fields full to confrontation.  HOH bilaterally. Facial sensation intact. Face, tongue, palate moves normally and symmetrically.  Motor: Normal bulk and tone. Normal strength in all tested extremity muscles Sensory.: intact to touch , pinprick , position and vibratory sensation.  Coordination: Rapid alternating movements normal in all extremities. Finger-to-nose and heel-to-shin performed accurately bilaterally. Gait and Station: Arises from chair with mild difficulty. Stance is slightly hunched. Gait demonstrates slightly decreased appetite and step length with mild unsteadiness and use of cane.  Unable to complete tandem walk and heel toe.  Reflexes: 1+ and symmetric. Toes downgoing.        ASSESSMENT: Dontre Laduca is a 89 y.o. year old male with right thalamocapsular infarct in 03/2023 secondary to small vessel disease and hx of left frontoparietal cortex embolic stroke s/p TNK on 01/13/2021 of cryptogenic etiology s/p ILR. 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       PLAN:  Right thalamic stroke: Hx of Cryptogenic stroke:  Residual deficit:  Continue clopidogrel 75 mg daily  and atorvastatin 80 mg daily for secondary stroke prevention managed/prescribed by PCP Loop recorder has not shown atrial fibrillation thus far.   Discussed secondary stroke prevention measures and importance of close PCP follow up for aggressive stroke risk factor management including BP goal<130/90, and HLD with LDL goal<70   Stroke labs 44/0102: LDL 98, A1c 5.4 I have gone over the pathophysiology of stroke, warning signs and symptoms, risk factors and their management in some detail with instructions to go to the closest emergency room for symptoms of concern.  Gait impairment: Likely multifactorial. Neuro exam unremarkable. Some gait impairment noted although recovering from T12 compression fracture post fall.  Continue to follow with orthopedics.  Would recommend participation with PT once cleared by ortho.  Continued use of cane at all times.    Doing well from stroke standpoint and risk factors are managed by PCP. She may follow up PRN, as usual for our patients who are strictly being followed for stroke. If any new neurological issues should arise, request PCP place referral for evaluation by one of our neurologists. Thank you.     CC:  PCP: Shawnie Dapper, PA-C    I spent 34 minutes of face-to-face and non-face-to-face time with patient and wife.  This included previsit chart review, lab review, study review, order entry, electronic health record documentation, patient and wife education regarding prior stroke including etiology, indication for loop recorder and review, secondary stroke prevention measures and importance of managing stroke risk factors, gait impairment and answered all other questions to patient and wife's satisfaction   Ihor Austin, AGNP-BC  [facility] [address] [place], [place] [address]  Phone  [phone] Fax [phone] Note: This document was prepared with digital dictation and possible smart phrase technology. Any transcriptional errors that result from this process are unintentional.

## 2023-07-12 ENCOUNTER — Encounter: Payer: Self-pay | Admitting: Adult Health

## 2023-07-12 ENCOUNTER — Ambulatory Visit: Payer: Medicare HMO | Admitting: Cardiovascular Disease

## 2023-07-12 ENCOUNTER — Ambulatory Visit: Payer: Medicare HMO | Admitting: Adult Health

## 2023-07-12 VITALS — BP 105/62 | HR 80 | Ht 67.0 in | Wt 139.0 lb

## 2023-07-12 DIAGNOSIS — I639 Cerebral infarction, unspecified: Secondary | ICD-10-CM

## 2023-07-12 DIAGNOSIS — I6381 Other cerebral infarction due to occlusion or stenosis of small artery: Secondary | ICD-10-CM

## 2023-07-12 DIAGNOSIS — Z09 Encounter for follow-up examination after completed treatment for conditions other than malignant neoplasm: Secondary | ICD-10-CM | POA: Diagnosis not present

## 2023-07-12 MED ORDER — CLOPIDOGREL BISULFATE 75 MG PO TABS: 75.0000 mg | ORAL_TABLET | Freq: Every day | ORAL | 0 refills | Status: DC

## 2023-07-12 NOTE — Patient Instructions (Signed)
 Continue to do exercises at home, if you are interested in any additional therapy, please let me know  Loop recorder will continue to be monitored by cardiology  Restart clopidogrel 75 mg daily and continue atorvastatin 80mg  daily for secondary stroke prevention, request ongoing refills by your PCP   Continue to follow up with PCP regarding blood pressure and cholesterol management  Maintain strict control of hypertension with blood pressure goal below 130/90 and cholesterol with LDL cholesterol (bad cholesterol) goal below 70 mg/dL.   Signs of a Stroke? Follow the BEFAST method:  Balance Watch for a sudden loss of balance, trouble with coordination or vertigo Eyes Is there a sudden loss of vision in one or both eyes? Or double vision?  Face: Ask the person to smile. Does one side of the face droop or is it numb?  Arms: Ask the person to raise both arms. Does one arm drift downward? Is there weakness or numbness of a leg? Speech: Ask the person to repeat a simple phrase. Does the speech sound slurred/strange? Is the person confused ? Time: If you observe any of these signs, call [phone].       Thank you for coming to see [place] at [facility]. I hope we have been able to provide you high quality care today.  You may receive a patient satisfaction survey over the next few weeks. We would appreciate your feedback and comments so that we may continue to improve ourselves and the health of our patients.

## 2023-07-18 ENCOUNTER — Ambulatory Visit (INDEPENDENT_AMBULATORY_CARE_PROVIDER_SITE_OTHER): Payer: Medicare HMO

## 2023-07-18 DIAGNOSIS — I639 Cerebral infarction, unspecified: Secondary | ICD-10-CM | POA: Diagnosis not present

## 2023-07-19 LAB — CUP PACEART REMOTE DEVICE CHECK
Date Time Interrogation Session: 20250302230644
Implantable Pulse Generator Implant Date: 20220901

## 2023-07-20 ENCOUNTER — Encounter: Payer: Self-pay | Admitting: Cardiovascular Disease

## 2023-07-25 NOTE — Progress Notes (Signed)
 [facility] Summary Report / Loop Recorder

## 2023-08-01 ENCOUNTER — Other Ambulatory Visit (HOSPITAL_COMMUNITY): Payer: Self-pay

## 2023-08-22 ENCOUNTER — Ambulatory Visit (INDEPENDENT_AMBULATORY_CARE_PROVIDER_SITE_OTHER): Payer: Medicare HMO

## 2023-08-22 ENCOUNTER — Other Ambulatory Visit (HOSPITAL_BASED_OUTPATIENT_CLINIC_OR_DEPARTMENT_OTHER): Payer: Self-pay

## 2023-08-22 DIAGNOSIS — I639 Cerebral infarction, unspecified: Secondary | ICD-10-CM | POA: Diagnosis not present

## 2023-08-22 MED ORDER — TAMSULOSIN HCL 0.4 MG PO CAPS
0.4000 mg | ORAL_CAPSULE | Freq: Every day | ORAL | 1 refills | Status: DC
Start: 2023-08-22 — End: 2023-11-23
  Filled 2023-08-22: qty 90, 90d supply, fill #0

## 2023-08-22 NOTE — Progress Notes (Signed)
 [facility] Summary Report / Loop Recorder

## 2023-08-22 NOTE — Addendum Note (Signed)
 Addended by: Geralyn Flash D on: 08/22/2023 01:09 PM   Modules accepted: Orders

## 2023-08-23 LAB — CUP PACEART REMOTE DEVICE CHECK
Date Time Interrogation Session: 20250406230741
Implantable Pulse Generator Implant Date: 20220901

## 2023-08-24 ENCOUNTER — Other Ambulatory Visit (HOSPITAL_BASED_OUTPATIENT_CLINIC_OR_DEPARTMENT_OTHER): Payer: Self-pay

## 2023-08-28 ENCOUNTER — Encounter: Payer: Self-pay | Admitting: Cardiovascular Disease

## 2023-08-29 ENCOUNTER — Other Ambulatory Visit (HOSPITAL_BASED_OUTPATIENT_CLINIC_OR_DEPARTMENT_OTHER): Payer: Self-pay

## 2023-09-26 ENCOUNTER — Ambulatory Visit (INDEPENDENT_AMBULATORY_CARE_PROVIDER_SITE_OTHER): Payer: Medicare HMO

## 2023-09-26 DIAGNOSIS — I639 Cerebral infarction, unspecified: Secondary | ICD-10-CM | POA: Diagnosis not present

## 2023-09-27 LAB — CUP PACEART REMOTE DEVICE CHECK
Date Time Interrogation Session: 20250511233649
Implantable Pulse Generator Implant Date: 20220901

## 2023-09-30 ENCOUNTER — Ambulatory Visit: Payer: Self-pay | Admitting: Cardiovascular Disease

## 2023-10-12 NOTE — Addendum Note (Signed)
 Addended by: Edra Govern D on: 10/12/2023 03:12 PM   Modules accepted: Orders

## 2023-10-12 NOTE — Progress Notes (Signed)
 [facility] Summary Report / Loop Recorder

## 2023-10-27 ENCOUNTER — Ambulatory Visit (INDEPENDENT_AMBULATORY_CARE_PROVIDER_SITE_OTHER)

## 2023-10-27 DIAGNOSIS — I639 Cerebral infarction, unspecified: Secondary | ICD-10-CM | POA: Diagnosis not present

## 2023-10-27 LAB — CUP PACEART REMOTE DEVICE CHECK
Date Time Interrogation Session: 20250611231722
Implantable Pulse Generator Implant Date: 20220901

## 2023-10-29 ENCOUNTER — Ambulatory Visit: Payer: Self-pay | Admitting: Cardiovascular Disease

## 2023-10-31 ENCOUNTER — Encounter (HOSPITAL_COMMUNITY): Payer: Self-pay

## 2023-10-31 ENCOUNTER — Emergency Department (HOSPITAL_COMMUNITY): Admitting: Anesthesiology

## 2023-10-31 ENCOUNTER — Emergency Department (HOSPITAL_COMMUNITY)

## 2023-10-31 ENCOUNTER — Inpatient Hospital Stay (HOSPITAL_COMMUNITY)
Admission: EM | Admit: 2023-10-31 | Discharge: 2023-11-05 | DRG: 481 | Disposition: A | Discharge status: Rehabilitation Facility | Attending: Internal Medicine | Admitting: Internal Medicine

## 2023-10-31 ENCOUNTER — Other Ambulatory Visit: Payer: Self-pay

## 2023-10-31 DIAGNOSIS — E876 Hypokalemia: Secondary | ICD-10-CM | POA: Diagnosis present

## 2023-10-31 DIAGNOSIS — K219 Gastro-esophageal reflux disease without esophagitis: Secondary | ICD-10-CM | POA: Diagnosis present

## 2023-10-31 DIAGNOSIS — W19XXXA Unspecified fall, initial encounter: Secondary | ICD-10-CM

## 2023-10-31 DIAGNOSIS — E559 Vitamin D deficiency, unspecified: Secondary | ICD-10-CM | POA: Diagnosis present

## 2023-10-31 DIAGNOSIS — Z8673 Personal history of transient ischemic attack (TIA), and cerebral infarction without residual deficits: Secondary | ICD-10-CM

## 2023-10-31 DIAGNOSIS — E785 Hyperlipidemia, unspecified: Secondary | ICD-10-CM | POA: Diagnosis present

## 2023-10-31 DIAGNOSIS — Z9081 Acquired absence of spleen: Secondary | ICD-10-CM

## 2023-10-31 DIAGNOSIS — Z7902 Long term (current) use of antithrombotics/antiplatelets: Secondary | ICD-10-CM | POA: Diagnosis not present

## 2023-10-31 DIAGNOSIS — N400 Enlarged prostate without lower urinary tract symptoms: Secondary | ICD-10-CM | POA: Diagnosis present

## 2023-10-31 DIAGNOSIS — Y92009 Unspecified place in unspecified non-institutional (private) residence as the place of occurrence of the external cause: Secondary | ICD-10-CM

## 2023-10-31 DIAGNOSIS — R8290 Unspecified abnormal findings in urine: Secondary | ICD-10-CM | POA: Diagnosis not present

## 2023-10-31 DIAGNOSIS — E440 Moderate protein-calorie malnutrition: Secondary | ICD-10-CM | POA: Diagnosis present

## 2023-10-31 DIAGNOSIS — S72145S Nondisplaced intertrochanteric fracture of left femur, sequela: Secondary | ICD-10-CM | POA: Diagnosis not present

## 2023-10-31 DIAGNOSIS — F05 Delirium due to known physiological condition: Secondary | ICD-10-CM | POA: Diagnosis not present

## 2023-10-31 DIAGNOSIS — I10 Essential (primary) hypertension: Secondary | ICD-10-CM | POA: Diagnosis present

## 2023-10-31 DIAGNOSIS — F54 Psychological and behavioral factors associated with disorders or diseases classified elsewhere: Secondary | ICD-10-CM | POA: Diagnosis not present

## 2023-10-31 DIAGNOSIS — J449 Chronic obstructive pulmonary disease, unspecified: Secondary | ICD-10-CM | POA: Diagnosis present

## 2023-10-31 DIAGNOSIS — S72142A Displaced intertrochanteric fracture of left femur, initial encounter for closed fracture: Secondary | ICD-10-CM | POA: Diagnosis not present

## 2023-10-31 DIAGNOSIS — S72142D Displaced intertrochanteric fracture of left femur, subsequent encounter for closed fracture with routine healing: Secondary | ICD-10-CM | POA: Diagnosis not present

## 2023-10-31 DIAGNOSIS — D72829 Elevated white blood cell count, unspecified: Secondary | ICD-10-CM | POA: Diagnosis present

## 2023-10-31 DIAGNOSIS — S72002A Fracture of unspecified part of neck of left femur, initial encounter for closed fracture: Secondary | ICD-10-CM | POA: Diagnosis not present

## 2023-10-31 DIAGNOSIS — S72145A Nondisplaced intertrochanteric fracture of left femur, initial encounter for closed fracture: Secondary | ICD-10-CM | POA: Diagnosis not present

## 2023-10-31 DIAGNOSIS — K5900 Constipation, unspecified: Secondary | ICD-10-CM | POA: Diagnosis not present

## 2023-10-31 DIAGNOSIS — Z87891 Personal history of nicotine dependence: Secondary | ICD-10-CM

## 2023-10-31 DIAGNOSIS — Z79899 Other long term (current) drug therapy: Secondary | ICD-10-CM | POA: Diagnosis not present

## 2023-10-31 DIAGNOSIS — H9190 Unspecified hearing loss, unspecified ear: Secondary | ICD-10-CM | POA: Diagnosis present

## 2023-10-31 DIAGNOSIS — D649 Anemia, unspecified: Secondary | ICD-10-CM | POA: Diagnosis not present

## 2023-10-31 DIAGNOSIS — Z885 Allergy status to narcotic agent status: Secondary | ICD-10-CM

## 2023-10-31 DIAGNOSIS — N402 Nodular prostate without lower urinary tract symptoms: Secondary | ICD-10-CM | POA: Diagnosis not present

## 2023-10-31 DIAGNOSIS — Y92000 Kitchen of unspecified non-institutional (private) residence as  the place of occurrence of the external cause: Secondary | ICD-10-CM | POA: Diagnosis not present

## 2023-10-31 DIAGNOSIS — Z6822 Body mass index (BMI) 22.0-22.9, adult: Secondary | ICD-10-CM

## 2023-10-31 DIAGNOSIS — D62 Acute posthemorrhagic anemia: Secondary | ICD-10-CM | POA: Diagnosis not present

## 2023-10-31 DIAGNOSIS — M25552 Pain in left hip: Secondary | ICD-10-CM | POA: Diagnosis present

## 2023-10-31 DIAGNOSIS — M80052A Age-related osteoporosis with current pathological fracture, left femur, initial encounter for fracture: Secondary | ICD-10-CM | POA: Diagnosis present

## 2023-10-31 DIAGNOSIS — W010XXA Fall on same level from slipping, tripping and stumbling without subsequent striking against object, initial encounter: Secondary | ICD-10-CM | POA: Diagnosis present

## 2023-10-31 DIAGNOSIS — G4700 Insomnia, unspecified: Secondary | ICD-10-CM | POA: Diagnosis not present

## 2023-10-31 DIAGNOSIS — K5901 Slow transit constipation: Secondary | ICD-10-CM | POA: Diagnosis not present

## 2023-10-31 DIAGNOSIS — G8918 Other acute postprocedural pain: Secondary | ICD-10-CM | POA: Diagnosis not present

## 2023-10-31 DIAGNOSIS — R410 Disorientation, unspecified: Secondary | ICD-10-CM | POA: Diagnosis not present

## 2023-10-31 HISTORY — DX: Personal history of urinary calculi: Z87.442

## 2023-10-31 LAB — CBC
HCT: 39.8 % (ref 39.0–52.0)
Hemoglobin: 12.9 g/dL — ABNORMAL LOW (ref 13.0–17.0)
MCH: 31.1 pg (ref 26.0–34.0)
MCHC: 32.4 g/dL (ref 30.0–36.0)
MCV: 95.9 fL (ref 80.0–100.0)
Platelets: 322 10*3/uL (ref 150–400)
RBC: 4.15 MIL/uL — ABNORMAL LOW (ref 4.22–5.81)
RDW: 12.5 % (ref 11.5–15.5)
WBC: 8.7 10*3/uL (ref 4.0–10.5)
nRBC: 0 % (ref 0.0–0.2)

## 2023-10-31 LAB — URINALYSIS, ROUTINE W REFLEX MICROSCOPIC
Bacteria, UA: NONE SEEN
Bilirubin Urine: NEGATIVE
Glucose, UA: NEGATIVE mg/dL
Ketones, ur: NEGATIVE mg/dL
Nitrite: POSITIVE — AB
Protein, ur: 30 mg/dL — AB
Specific Gravity, Urine: 1.016 (ref 1.005–1.030)
pH: 6 (ref 5.0–8.0)

## 2023-10-31 LAB — I-STAT CG4 LACTIC ACID, ED: Lactic Acid, Venous: 1 mmol/L (ref 0.5–1.9)

## 2023-10-31 LAB — COMPREHENSIVE METABOLIC PANEL WITH GFR
ALT: 14 U/L (ref 0–44)
AST: 20 U/L (ref 15–41)
Albumin: 2.9 g/dL — ABNORMAL LOW (ref 3.5–5.0)
Alkaline Phosphatase: 46 U/L (ref 38–126)
Anion gap: 6 (ref 5–15)
BUN: 17 mg/dL (ref 8–23)
CO2: 26 mmol/L (ref 22–32)
Calcium: 8.9 mg/dL (ref 8.9–10.3)
Chloride: 108 mmol/L (ref 98–111)
Creatinine, Ser: 1.01 mg/dL (ref 0.61–1.24)
GFR, Estimated: >60 mL/min (ref >60)
Glucose, Bld: 109 mg/dL — ABNORMAL HIGH (ref 70–99)
Potassium: 4 mmol/L (ref 3.5–5.1)
Sodium: 140 mmol/L (ref 135–145)
Total Bilirubin: 0.6 mg/dL (ref 0.0–1.2)
Total Protein: 6.7 g/dL (ref 6.5–8.1)

## 2023-10-31 LAB — PROTIME-INR
INR: 1.2 (ref 0.8–1.2)
Prothrombin Time: 15.7 s — ABNORMAL HIGH (ref 11.4–15.2)

## 2023-10-31 MED ORDER — MORPHINE SULFATE (PF) 2 MG/ML IV SOLN
2.0000 mg | Freq: Once | INTRAVENOUS | Status: AC
  Filled 2023-10-31: qty 1

## 2023-10-31 MED ORDER — METHOCARBAMOL 500 MG PO TABS
500.0000 mg | ORAL_TABLET | Freq: Four times a day (QID) | ORAL | Status: DC | PRN
  Filled 2023-10-31 (×2): qty 1

## 2023-10-31 MED ORDER — LACTATED RINGERS IV SOLN: Freq: Once | INTRAVENOUS | Status: AC

## 2023-10-31 MED ORDER — BISACODYL 5 MG PO TBEC: 5.0000 mg | DELAYED_RELEASE_TABLET | Freq: Every day | ORAL | Status: DC | PRN

## 2023-10-31 MED ORDER — DEXAMETHASONE SODIUM PHOSPHATE 10 MG/ML IJ SOLN
INTRAMUSCULAR | Status: DC | PRN
  Administered 2023-10-31: 10 mg

## 2023-10-31 MED ORDER — HYDROCODONE-ACETAMINOPHEN 5-325 MG PO TABS
1.0000 | ORAL_TABLET | Freq: Four times a day (QID) | ORAL | Status: DC | PRN
  Administered 2023-11-02: 1 via ORAL
  Filled 2023-10-31 (×2): qty 1

## 2023-10-31 MED ORDER — FENTANYL CITRATE PF 50 MCG/ML IJ SOSY
25.0000 ug | PREFILLED_SYRINGE | Freq: Once | INTRAMUSCULAR | Status: AC
  Filled 2023-10-31: qty 1

## 2023-10-31 MED ORDER — DOCUSATE SODIUM 100 MG PO CAPS
100.0000 mg | ORAL_CAPSULE | Freq: Two times a day (BID) | ORAL | Status: DC
  Administered 2023-10-31 – 2023-11-04 (×7): 100 mg via ORAL
  Filled 2023-10-31 (×10): qty 1

## 2023-10-31 MED ORDER — ROPIVACAINE HCL 5 MG/ML IJ SOLN: INTRAMUSCULAR | Status: DC | PRN

## 2023-10-31 MED ORDER — HYDRALAZINE HCL 20 MG/ML IJ SOLN: 5.0000 mg | INTRAMUSCULAR | Status: DC | PRN

## 2023-10-31 MED ORDER — METHOCARBAMOL 1000 MG/10ML IJ SOLN: 500.0000 mg | Freq: Four times a day (QID) | INTRAMUSCULAR | Status: DC | PRN

## 2023-10-31 MED ORDER — POLYETHYLENE GLYCOL 3350 17 G PO PACK: 17.0000 g | PACK | Freq: Every day | ORAL | Status: DC | PRN

## 2023-10-31 NOTE — ED Notes (Addendum)
 Pt in bed, daughter at bedside, pt states that his pain is much better.  Daughter states that she took his belongings home, everything except his hearing aids

## 2023-10-31 NOTE — ED Notes (Signed)
 Pt in bed, pt requests more pain med, MD notified

## 2023-10-31 NOTE — ED Notes (Addendum)
 Trauma Response Nurse Documentation   Samuel Mata is a 89 y.o. male arriving to [place] ED via EMS  On clopidogrel  75 mg daily. Trauma was activated as a Level 2 by ED Charge RN based on the following trauma criteria Elderly patients > 65 with head trauma on anti-coagulation (excluding ASA). Possible hip injury Patient cleared for CT by Dr. Linder Revere. Pt transported to CT with trauma response nurse present to monitor. RN remained with the patient throughout their absence from the department for clinical observation.   GCS 15.  History   Past Medical History:  Diagnosis Date   Bronchiectasis without complication (HCC)    Enlarged prostate    Hearing loss    Hypertension    Pulmonary nocardiosis (HCC) 2021   treated with 6 month antibiotic course   Renal stones    Stroke (HCC) 2022   left frontoparietal   TIA (transient ischemic attack) 2019     Past Surgical History:  Procedure Laterality Date   APPENDECTOMY     CHOLECYSTECTOMY     LOOP RECORDER INSERTION N/A 01/15/2021   Procedure: LOOP RECORDER INSERTION;  Surgeon: Jolly Needle, MD;  Location: [facility] INVASIVE CV LAB;  Service: Cardiovascular;  Laterality: N/A;   NOSE SURGERY     SPINE SURGERY     compressoin fx T12, kyphoplasty   SPLENECTOMY, TOTAL       Initial Focused Assessment (If applicable, or please see trauma documentation): Airway: Intact, patent Breathing: Breath sounds clear, equal bilaterally. No SOB or CP. Circulation: No external signs of hemorrhage.  DP pulse distal to injury palpable (L).  SBP WDL.  22G PIV to L wrist Disability: EMS c-collar on.  PERRLA 3's.  MAE equally with exception to LLE due to pain.  Equal sensation throughout.  Pt c/o L hip pain. Pt also reports striking his head when he fell.  CT's Completed:   CT Head and CT C-Spine   Interventions:  CXR Pelvic XR L hip unilateral XR CT head and c-spine 50mcg fentanyl given SpO2 92% on RA - placed on 2L [place].  Labs drawn  Plan for  disposition:  Other Awaiting remainder of results - OR tentatively scheduled for tomorrow.   Consults completed:  Orthopaedic Surgeon Starr Eddy, PA consulted at 1524. At bedside at 1546.  Event Summary: Pt was walking in his kitchen when he lost his balance and fell. Pt struck his head and L hip. Pt had immediate L hip pain and was unable to get himself up. Pt was placed in a C-collar and brought into ED.  Pt was given 50mcg of fentanyl en route.    Bedside handoff with ED RN Sherolyn Dixon.    Juan Noel  Trauma Response RN  Please call TRN at [phone] for further assistance.

## 2023-10-31 NOTE — ED Notes (Signed)
 Pt in bed, daughter at bedside, daughter has pt's ring, wallet and necklace.  Pt continues to report 8/10 pain, pain med given.

## 2023-10-31 NOTE — ED Notes (Signed)
 Pt placed on 2L O2 via [place] secondary to decreased O2 sat

## 2023-10-31 NOTE — H&P (Signed)
 History and Physical    Patient: Samuel Mata ZOX:[id number] DOB: 1930/06/21 DOA: 10/31/2023 DOS: the patient was seen and examined on 10/31/2023 PCP: Maryln Sober, PA-C  Patient coming from: Home Chief complaint: Fall. HPI:  Samuel Mata is a 89 y.o. male with past medical history  of bronchiectasis essential hypertension, history of stroke, nephrolithiasis, history of nocardiosis pulmonary presenting with fall when he lost his balance in the kitchen and he fell on his left side.  Patient denies loss of consciousness or striking his head.  At baseline patient uses a walker.  Patient also has a history of tobacco abuse in the past and an allergy to codeine. Daughter camille at bedside states that he has poor balance and should use walker and does not.  ED Course: Pt in ed at bedside  is alert awake oriented afebrile. Vital signs in the ED were notable for the following:  Vitals:   10/31/23 1700 10/31/23 1715 10/31/23 1730 10/31/23 1825  BP: (!) 148/79 139/71 (!) 158/79   Pulse: 80 73 76 71  Temp:  98.9 F (37.2 C) 98.8 F (37.1 C) 98.3 F (36.8 C)  Resp: 20 20 (!) 26   Height:      Weight:      SpO2: 90% 91% 92%   TempSrc:    Oral  BMI (Calculated):      >>ED evaluation thus far shows: CMP today shows glucose of 109 essentially normal except for mildly low albumin. CBC shows a normal white count hemoglobin of 12.9 platelets of 322. Urinalysis is abnormal today showing small leukocytes positive nitrite 11-20 WBCs small hemoglobin. Head CT is negative for any acute intracranial abnormality CT spine is negative for any acute findings. Chest x-ray done today shows chronic lung disease without evidence of acute cardiopulmonary process. Pelvis x-ray shows left hip fracture. EKG shows sinus rhythm 66 with a PR of 175 QTc of 455 incomplete left bundle branch block. 2D Echo 03/2023:  1. Left ventricular ejection fraction, by estimation, is 55 to 60%. The  left ventricle has  normal function. The left ventricle has no regional  wall motion abnormalities. Left ventricular diastolic parameters were  normal.   2. Right ventricular systolic function is normal. The right ventricular  size is normal. There is normal pulmonary artery systolic pressure.   3. The mitral valve is abnormal. Mild mitral valve regurgitation. No  evidence of mitral stenosis. There is mild holosystolic prolapse of  multiple scallops of the posterior leaflet of the mitral valve.   4. The aortic valve is tricuspid. Aortic valve regurgitation is not  visualized. No aortic stenosis is present.   5. The inferior vena cava is normal in size with greater than 50%  respiratory variability, suggesting right atrial pressure of 3 mmHg.   6. Agitated saline contrast bubble study was negative, with no evidence  of any interatrial shunt.   >>While in the ED patient received the following: Medications  morphine (PF) 2 MG/ML injection 2 mg (has no administration in time range)  fentaNYL (SUBLIMAZE) injection 25 mcg (25 mcg Intravenous Given by Other 10/31/23 1522)   Review of Systems  Musculoskeletal:  Positive for falls and joint pain.  All other systems reviewed and are negative.  Past Medical History:  Diagnosis Date   Bronchiectasis without complication (HCC)    Enlarged prostate    Hearing loss    Hypertension    Pulmonary nocardiosis (HCC) 2021   treated with 6 month antibiotic course   Renal stones  Stroke [facility]) 2022   left frontoparietal   TIA (transient ischemic attack) 2019   Past Surgical History:  Procedure Laterality Date   APPENDECTOMY     CHOLECYSTECTOMY     LOOP RECORDER INSERTION N/A 01/15/2021   Procedure: LOOP RECORDER INSERTION;  Surgeon: Jolly Needle, MD;  Location: [facility] INVASIVE [facility];  Service: Cardiovascular;  Laterality: N/A;   NOSE SURGERY     SPINE SURGERY     compressoin fx T12, kyphoplasty   SPLENECTOMY, TOTAL      reports that he quit smoking about 39 years  ago. His smoking use included cigarettes. He started smoking about 69 years ago. He has a 30 pack-year smoking history. He has never used smokeless tobacco. He reports that he does not drink alcohol  and does not use drugs. Allergies  Allergen Reactions   Codeine     Unknown reaction   Family History  Problem Relation Age of Onset   Lung disease Neg Hx    Prior to Admission medications   Medication Sig Start Date End Date Taking? Authorizing Provider  acetaminophen  (TYLENOL ) 325 MG tablet Take 650 mg by mouth as needed for headache.    [provider]  amLODipine  (NORVASC ) 5 MG tablet Take 1 tablet (5 mg total) by mouth daily. 05/27/23     Calcium  Carbonate Antacid (TUMS PO) Take 1 tablet by mouth daily as needed (for heartburn).    [provider]  clopidogrel  (PLAVIX ) 75 MG tablet Take 1 tablet (75 mg total) by mouth daily. 07/12/23   Johny Nap, NP  famotidine  (PEPCID ) 20 MG tablet Take 1 tablet (20 mg total) by mouth daily. 05/27/23     fluticasone  (FLONASE ) 50 MCG/ACT nasal spray Place 2 sprays into both nostrils daily. 03/15/23   [provider]  Naphazoline HCl (CLEAR EYES OP) Place 1 drop into both eyes at bedtime.    [provider]  nitroGLYCERIN  (NITROSTAT ) 0.4 MG SL tablet Place 1 tablet (0.4 mg total) under the tongue every 5 (five) minutes as needed for chest pain. 04/01/23   Nahser, Lela Purple, MD  polyethylene glycol powder (GLYCOLAX /MIRALAX ) 17 GM/SCOOP powder Take 17 g by mouth daily. 04/29/23   Setzer, Sandra J, PA-C  polyvinyl alcohol  (LIQUIFILM TEARS) 1.4 % ophthalmic solution Place 2 drops into the right eye 4 (four) times daily. 04/29/23   Setzer, Sandra J, PA-C  rosuvastatin  (CRESTOR ) 20 MG tablet Take 1 tablet (20 mg total) by mouth daily with supper. 05/27/23     senna-docusate (SENOKOT-S) 8.6-50 MG tablet Take 2 tablets by mouth 2 (two) times daily. 04/29/23   Setzer, Sandra J, PA-C  sodium chloride  (OCEAN) 0.65 % nasal spray Place 1  spray into the nose as needed for congestion.    [provider]  tamsulosin  (FLOMAX ) 0.4 MG CAPS capsule Take 1 capsule (0.4 mg total) by mouth daily. 08/22/23                                                                                    Vitals:   10/31/23 1700 10/31/23 1715 10/31/23 1730 10/31/23 1825  BP: (!) 148/79 139/71 (!) 158/79   Pulse: 80 73 76 71  Resp: 20 20 (!) 26   Temp:  98.9 F (37.2 C) 98.8 F (37.1 C) 98.3 F (36.8 C)  TempSrc:    Oral  SpO2: 90% 91% 92%   Weight:      Height:       Physical Exam Vitals and nursing note reviewed.  Constitutional:      General: He is not in acute distress. HENT:     Head: Normocephalic and atraumatic.     Right Ear: Hearing normal.     Left Ear: Hearing normal.     Nose: No nasal deformity.     Mouth/Throat:     Lips: Pink.   Eyes:     General: Lids are normal.     Extraocular Movements: Extraocular movements intact.    Cardiovascular:     Rate and Rhythm: Normal rate and regular rhythm.     Heart sounds: Normal heart sounds.  Pulmonary:     Effort: Pulmonary effort is normal.     Breath sounds: Normal breath sounds.  Abdominal:     General: Bowel sounds are normal. There is no distension.     Palpations: Abdomen is soft. There is no mass.     Tenderness: There is no abdominal tenderness.   Musculoskeletal:        General: Tenderness and deformity present.     Right lower leg: No edema.     Left lower leg: No edema.   Skin:    General: Skin is warm.   Neurological:     General: No focal deficit present.     Mental Status: He is alert and oriented to person, place, and time.     Cranial Nerves: Cranial nerves 2-12 are intact.   Psychiatric:        Speech: Speech normal.     Labs on Admission: I have personally reviewed following labs and imaging studies CBC: Recent Labs  Lab 10/31/23 1520  WBC 8.7  HGB 12.9*  HCT 39.8  MCV 95.9  PLT 322   Basic Metabolic Panel: Recent Labs  Lab  10/31/23 1520  NA 140  K 4.0  CL 108  CO2 26  GLUCOSE 109*  BUN 17  CREATININE 1.01  CALCIUM  8.9   GFR: Estimated Creatinine Clearance: 44.3 mL/min (by C-G formula based on SCr of 1.01 mg/dL). Liver Function Tests: Recent Labs  Lab 10/31/23 1520  AST 20  ALT 14  ALKPHOS 46  BILITOT 0.6  PROT 6.7  ALBUMIN 2.9*   No results for input(s): LIPASE, AMYLASE in the last 168 hours. No results for input(s): AMMONIA in the last 168 hours. Coagulation Profile: Recent Labs  Lab 10/31/23 1520  INR 1.2   Cardiac Enzymes: No results for input(s): CKTOTAL, CKMB, CKMBINDEX, TROPONINI in the last 168 hours. BNP (last 3 results) No results for input(s): PROBNP in the last 8760 hours. HbA1C: No results for input(s): HGBA1C in the last 72 hours. CBG: No results for input(s): GLUCAP in the last 168 hours. Lipid Profile: No results for input(s): CHOL, HDL, LDLCALC, TRIG, CHOLHDL, LDLDIRECT in the last 72 hours. Thyroid Function Tests: No results for input(s): TSH, T4TOTAL, FREET4, T3FREE, THYROIDAB in the last 72 hours. Anemia Panel: No results for input(s): VITAMINB12, FOLATE, FERRITIN, TIBC, IRON, RETICCTPCT in the last 72 hours. Urine analysis:    Component Value Date/Time   COLORURINE YELLOW 10/31/2023 1620   APPEARANCEUR CLEAR 10/31/2023 1620   LABSPEC 1.016 10/31/2023 1620   PHURINE 6.0 10/31/2023 1620   GLUCOSEU  NEGATIVE 10/31/2023 1620   HGBUR SMALL (A) 10/31/2023 1620   BILIRUBINUR NEGATIVE 10/31/2023 1620   BILIRUBINUR negative 12/11/2022 1225   KETONESUR NEGATIVE 10/31/2023 1620   PROTEINUR 30 (A) 10/31/2023 1620   UROBILINOGEN 0.2 12/11/2022 1225   NITRITE POSITIVE (A) 10/31/2023 1620   LEUKOCYTESUR SMALL (A) 10/31/2023 1620   Radiological Exams on Admission: CT HEAD WO CONTRAST Result Date: 10/31/2023 CLINICAL DATA:  Head trauma EXAM: CT HEAD WITHOUT CONTRAST CT CERVICAL SPINE WITHOUT CONTRAST TECHNIQUE:  Multidetector CT imaging of the head and cervical spine was performed following the standard protocol without intravenous contrast. Multiplanar CT image reconstructions of the cervical spine were also generated. RADIATION DOSE REDUCTION: This exam was performed according to the departmental dose-optimization program which includes automated exposure control, adjustment of the mA and/or kV according to patient size and/or use of iterative reconstruction technique. COMPARISON:  CT brain 04/11/2023 FINDINGS: CT HEAD FINDINGS Brain: No acute territorial infarction, hemorrhage, or intracranial mass. Chronic cerebellar and right thalamic infarcts. Atrophy and mild chronic small vessel ischemic changes of the white matter. Stable ventricle size Vascular: No hyperdense vessels.  Carotid vascular calcification Skull: Normal. Negative for fracture or focal lesion. Sinuses/Orbits: No acute finding. Other: None CT CERVICAL SPINE FINDINGS Alignment: No subluxation.  Facet alignment is within normal limits. Skull base and vertebrae: No acute fracture. No primary bone lesion or focal pathologic process. Soft tissues and spinal canal: No prevertebral fluid or swelling. No visible canal hematoma. Disc levels: Advanced disc space narrowing and degenerative change C5-C6 and C6-C7 with moderate disc space narrowing at C7-T1. Multilevel facet degenerative changes. Upper chest: Apical scarring Other: None IMPRESSION: 1. No CT evidence for acute intracranial abnormality. Atrophy and chronic small vessel ischemic changes of the white matter. Chronic cerebellar and right thalamic infarcts. 2. Degenerative changes of the cervical spine. No acute osseous abnormality. Electronically Signed   By: Esmeralda Hedge M.D.   On: 10/31/2023 16:02   CT CERVICAL SPINE WO CONTRAST Result Date: 10/31/2023 CLINICAL DATA:  Head trauma EXAM: CT HEAD WITHOUT CONTRAST CT CERVICAL SPINE WITHOUT CONTRAST TECHNIQUE: Multidetector CT imaging of the head and  cervical spine was performed following the standard protocol without intravenous contrast. Multiplanar CT image reconstructions of the cervical spine were also generated. RADIATION DOSE REDUCTION: This exam was performed according to the departmental dose-optimization program which includes automated exposure control, adjustment of the mA and/or kV according to patient size and/or use of iterative reconstruction technique. COMPARISON:  CT brain 04/11/2023 FINDINGS: CT HEAD FINDINGS Brain: No acute territorial infarction, hemorrhage, or intracranial mass. Chronic cerebellar and right thalamic infarcts. Atrophy and mild chronic small vessel ischemic changes of the white matter. Stable ventricle size Vascular: No hyperdense vessels.  Carotid vascular calcification Skull: Normal. Negative for fracture or focal lesion. Sinuses/Orbits: No acute finding. Other: None CT CERVICAL SPINE FINDINGS Alignment: No subluxation.  Facet alignment is within normal limits. Skull base and vertebrae: No acute fracture. No primary bone lesion or focal pathologic process. Soft tissues and spinal canal: No prevertebral fluid or swelling. No visible canal hematoma. Disc levels: Advanced disc space narrowing and degenerative change C5-C6 and C6-C7 with moderate disc space narrowing at C7-T1. Multilevel facet degenerative changes. Upper chest: Apical scarring Other: None IMPRESSION: 1. No CT evidence for acute intracranial abnormality. Atrophy and chronic small vessel ischemic changes of the white matter. Chronic cerebellar and right thalamic infarcts. 2. Degenerative changes of the cervical spine. No acute osseous abnormality. Electronically Signed   By: Esmeralda Hedge  M.D.   On: 10/31/2023 16:02   DG Chest Port 1 View Result Date: 10/31/2023 CLINICAL DATA:  Trauma patient.  Left hip pain after falling. EXAM: PORTABLE CHEST 1 VIEW COMPARISON:  Radiographs 05/01/2023 and 04/08/2023.  CT 09/17/2019. FINDINGS: 1513 hours. The heart size and  mediastinal contours are stable with mild cardiomegaly and aortic atherosclerosis. There is chronic lung disease with architectural distortion, subpleural reticulation and scattered scarring, similar to prior imaging. No superimposed airspace disease, pleural effusion or pneumothorax. Loop recorder overlies the lower left chest. Patient is status post lower thoracic spinal augmentation. IMPRESSION: Grossly stable chronic lung disease without evidence of acute cardiopulmonary process. Electronically Signed   By: Elmon Hagedorn M.D.   On: 10/31/2023 15:35   DG Hip Port Unilat W or Wo Pelvis 1 View Left Result Date: 10/31/2023 CLINICAL DATA:  Post fall and trauma EXAM: DG HIP (WITH OR WITHOUT PELVIS) 1V PORT LEFT COMPARISON:  None Available. FINDINGS: Left hip intertrochanteric fracture with butterfly fragment of the lesser trochanter IMPRESSION: Left hip fracture Electronically Signed   By: Fredrich Jefferson M.D.   On: 10/31/2023 15:32   DG Pelvis Portable Result Date: 10/31/2023 CLINICAL DATA:  Status post fall EXAM: PORTABLE PELVIS 1-2 VIEWS COMPARISON:  None Available. FINDINGS: Left intertrochanteric fracture with avulsion fracture of the lesser trochanter with minimal varus deformity IMPRESSION: Left hip fracture Electronically Signed   By: Fredrich Jefferson M.D.   On: 10/31/2023 15:32   Data Reviewed: Relevant notes from primary care and specialist visits, past discharge summaries as available in EHR, including Care Everywhere . Prior diagnostic testing as pertinent to current admission diagnoses, Updated medications and problem lists for reconciliation .ED course, including vitals, labs, imaging, treatment and response to treatment,Triage notes, nursing and pharmacy notes and ED provider's notes.Notable results as noted in HPI.Discussed case with EDMD/ ED APP/ or Specialty MD on call and as needed.  Assessment & Plan  >>Fall: Fall precaution. PT eval post op and for discharge.   >>H/O stroke: Pt is on  plavix  and we will hold.   >> Left intertrochanteric fracture: Per ortho note Plan IMN tomorrow , NPO after MN. Pain control and MIVF.   >>COPD: Prn albuterol.   >>Essential Htn: Vitals:   10/31/23 1458 10/31/23 1508 10/31/23 1515 10/31/23 1530  BP: (!) 142/94 (!) 142/94 (!) 156/89 (!) 149/73   10/31/23 1545 10/31/23 1600 10/31/23 1615 10/31/23 1630  BP: (!) 156/75 (!) 174/78 (!) 150/94 (!) 155/88   10/31/23 1645 10/31/23 1700 10/31/23 1715 10/31/23 1730  BP: (!) 142/79 (!) 148/79 139/71 (!) 158/79  Home regimen consists of amlodipine  5 mg.  Will currently hold and start patient on as needed hydralazine.  Suspect patient will do better with low-dose HCTZ upon discharge as he has a history of tobacco abuse will defer to discharging MD to consider. Per daughter pt was on amlodipine  and was recently changed to benicar.   >>Hereditary spherocytosis: H/O splenectomy.   >>Anemia: Mild and stable.  We will follow.    DVT prophylaxis:  SCD's  Consults:  Orthopedic.  Advance Care Planning:    Code Status: Full Code   Family Communication:  None  Disposition Plan:  TBD. Severity of Illness: The appropriate patient status for this patient is INPATIENT. Inpatient status is judged to be reasonable and necessary in order to provide the required intensity of service to ensure the patient's safety. The patient's presenting symptoms, physical exam findings, and initial radiographic and laboratory data in the context of  their chronic comorbidities is felt to place them at high risk for further clinical deterioration. Furthermore, it is not anticipated that the patient will be medically stable for discharge from the hospital within 2 midnights of admission.   * I certify that at the point of admission it is my clinical judgment that the patient will require inpatient hospital care spanning beyond 2 midnights from the point of admission due to high intensity of service, high risk for further  deterioration and high frequency of surveillance required.*  Unresulted Labs (From admission, onward)     Start     Ordered   11/01/23 0500  CBC  Tomorrow morning,   R        10/31/23 1804   11/01/23 0500  Basic metabolic panel  Tomorrow morning,   R        10/31/23 1804            Meds ordered this encounter  Medications   fentaNYL (SUBLIMAZE) injection 25 mcg   morphine (PF) 2 MG/ML injection 2 mg   HYDROcodone-acetaminophen  (NORCO/VICODIN) 5-325 MG per tablet 1-2 tablet   OR Linked Order Group    methocarbamol (ROBAXIN) tablet 500 mg    methocarbamol (ROBAXIN) injection 500 mg   docusate sodium  (COLACE) capsule 100 mg   polyethylene glycol (MIRALAX  / GLYCOLAX ) packet 17 g   bisacodyl  (DULCOLAX) EC tablet 5 mg   hydrALAZINE (APRESOLINE) injection 5 mg   lactated ringers infusion     Orders Placed This Encounter  Procedures   DG Chest Port 1 View   DG Pelvis Portable   CT HEAD WO CONTRAST   CT CERVICAL SPINE WO CONTRAST   DG Hip Port Unilat W or Wo Pelvis 1 View Left   DG Knee Left Port   Comprehensive metabolic panel   CBC   Urinalysis, Routine w reflex microscopic -Urine, Clean Catch   Protime-INR   CBC   Basic metabolic panel   Diet NPO time specified Except for: Sips with Meds, Ice Chips   ED Cardiac monitoring   Measure blood pressure   Initiate Carrier Fluid Protocol   SCDs   Vital signs every hour x 4, then every 4 hours   Neurovascular checks every hour x 4, then every 4 hours   Bed with an overhead patient helper or overhead frame with a trapeze bar   Apply ice to affected area   Elevate heels off of bed   Turn cough deep breathe   Incentive spirometry   Bed rest with HOB to 30 degrees   Intake and output   If diabetic or glucose greater than 140mg /dl, notify physician to place Gylcemic Control (SSI) Order Set   Initiate Oral Care Protocol   Initiate Carrier Fluid Protocol   Document Pasero Opioid-Induced Sedation Scale (POSS) per protocol (see  sidebar report)   Cardiac Monitoring - Continuous Indefinite   Full code   Consult to anesthesiology for nerve block Laterality: Left; Nerve block will be performed in PACU.   Consult for Unassigned Medical Admission   Consult to Transition of Care Team   Consult to Registered Dietitian   Consult to anesthesiology for nerve block Laterality: Left; Nerve block will be performed in PACU.   ED Pulse oximetry, continuous   Oxygen therapy Mode or (Route): Nasal cannula; Liters Per Minute: 2   Pulse oximetry, continuous   Pulse oximetry, every 4 hours   I-Stat Lactic Acid, ED   EKG 12-Lead   EKG 12-Lead  EKG 12-Lead   EKG 12-Lead   Admit to Inpatient (patient's expected length of stay will be greater than 2 midnights or inpatient only procedure)    Author: Lavanda Porter, MD 12 pm- 8 pm. [facility]. 10/31/2023 6:27 PM Please note for any communication after hours contact [facility] Assigned provider on call on Amion.

## 2023-10-31 NOTE — ED Triage Notes (Signed)
 PT TO ER room number 27 via ems, per ems pt tripped and fell and hit his head and L hip.  Pt states that he fell in the kitchen and is on blood thinners.

## 2023-10-31 NOTE — TOC CAGE-AID Note (Signed)
 Transition of Care [facility]) - CAGE-AID Screening   Patient Details  Name: Samuel Mata MRN: [id number] Date of Birth: 05-25-1930  Transition of Care [facility]) CM/SW Contact:    Juan Noel, RN Phone Number: [phone] 10/31/2023, 4:21 PM   Clinical Narrative: Pt denies alcohol  or illicit drug use.  Screening complete.   CAGE-AID Screening:    Have You Ever Felt You Ought to Cut Down on Your Drinking or Drug Use?: No Have People Annoyed You By Critizing Your Drinking Or Drug Use?: No Have You Felt Bad Or Guilty About Your Drinking Or Drug Use?: No Have You Ever Had a Drink or Used Drugs First Thing In The Morning to Steady Your Nerves or to Get Rid of a Hangover?: No CAGE-AID Score: 0  Substance Abuse Education Offered: No

## 2023-10-31 NOTE — Anesthesia Procedure Notes (Signed)
 Anesthesia Regional Block: Femoral nerve block   Pre-Anesthetic Checklist: , timeout performed,  Correct Patient, Correct Site, Correct Laterality,  Correct Procedure, Correct Position, site marked,  Risks and benefits discussed,  Surgical consent,  Pre-op evaluation,  At surgeon's request and post-op pain management  Laterality: Left  Prep: Dura Prep       Needles:  Injection technique: Single-shot  Needle Type: Echogenic Stimulator Needle     Needle Length: 10cm  Needle Gauge: 20     Additional Needles:   Procedures:,,,, ultrasound used (permanent image in chart),,    Narrative:  Start time: 10/31/2023 5:30 PM End time: 10/31/2023 5:35 PM Injection made incrementally with aspirations every 5 mL.  Performed by: Personally  Anesthesiologist: Micheal Agent, DO  Additional Notes: Patient identified. Risks/Benefits/Options discussed with patient including but not limited to bleeding, infection, nerve damage, failed block, incomplete pain control. Patient expressed understanding and wished to proceed. All questions were answered. Sterile technique was used throughout the entire procedure. Please see nursing notes for vital signs. Aspirated in 5cc intervals with injection for negative confirmation. Patient was given instructions on fall risk and not to get out of bed. All questions and concerns addressed with instructions to call with any issues or inadequate analgesia.

## 2023-10-31 NOTE — ED Provider Notes (Signed)
 [facility] AT [facility] Provider Note   CSN: [id number] Arrival date & time: 10/31/23  1452     Patient presents with: No chief complaint on file.   Samuel Mata is a 89 y.o. male.   89 year old male with fall.  On Plavix .  Mechanical nature.  Having left hip pain.  See ED course for further HPI and MDM.        Prior to Admission medications   Medication Sig Start Date End Date Taking? Authorizing Provider  clopidogrel  (PLAVIX ) 75 MG tablet Take 1 tablet (75 mg total) by mouth daily. 07/12/23  Yes McCue, Camilo Cella, NP  famotidine  (PEPCID ) 20 MG tablet Take 1 tablet (20 mg total) by mouth daily. Patient taking differently: Take 20 mg by mouth every evening. 05/27/23  Yes   fluticasone  (FLONASE ) 50 MCG/ACT nasal spray Place 2 sprays into both nostrils daily as needed for allergies. 03/15/23  Yes [provider]  Naphazoline HCl (CLEAR EYES OP) Place 1 drop into both eyes daily as needed (for eye redness).   Yes [provider]  olmesartan (BENICAR) 5 MG tablet Take 5 mg by mouth daily.   Yes [provider]  polyvinyl alcohol  (LIQUIFILM TEARS) 1.4 % ophthalmic solution Place 2 drops into the right eye 4 (four) times daily. Patient taking differently: Place 2 drops into the right eye daily as needed for dry eyes. 04/29/23  Yes Setzer, Sandra J, PA-C  rosuvastatin  (CRESTOR ) 20 MG tablet Take 1 tablet (20 mg total) by mouth daily with supper. 05/27/23  Yes   sodium chloride  (OCEAN) 0.65 % nasal spray Place 1 spray into the nose as needed for congestion.   Yes [provider]  tamsulosin  (FLOMAX ) 0.4 MG CAPS capsule Take 1 capsule (0.4 mg total) by mouth daily. Patient taking differently: Take 0.4 mg by mouth daily after supper. 08/22/23  Yes   acetaminophen  (TYLENOL ) 325 MG tablet Take 650 mg by mouth as needed for headache.    [provider]  amLODipine  (NORVASC ) 5 MG tablet Take 1 tablet (5 mg total) by mouth  daily. Patient not taking: Reported on 10/31/2023 05/27/23     nitroGLYCERIN  (NITROSTAT ) 0.4 MG SL tablet Place 1 tablet (0.4 mg total) under the tongue every 5 (five) minutes as needed for chest pain. 04/01/23   Nahser, Lela Purple, MD    Allergies: Codeine    Review of Systems  Updated Vital Signs BP (!) 147/56 (BP Location: Right Arm)   Pulse 76   Temp 98.4 F (36.9 C) (Oral)   Resp 18   Ht 5' 7 (1.702 m)   Wt 64.4 kg   SpO2 95%   BMI 22.24 kg/m   Physical Exam Vitals and nursing note reviewed.  HENT:     Head: Normocephalic and atraumatic.     Nose: Nose normal.     Mouth/Throat:     Mouth: Mucous membranes are moist.   Eyes:     Conjunctiva/sclera: Conjunctivae normal.     Pupils: Pupils are equal, round, and reactive to light.   Neck:     Comments: In c-collar. Cardiovascular:     Pulses: Normal pulses.     Heart sounds: Normal heart sounds.  Pulmonary:     Effort: Pulmonary effort is normal.     Breath sounds: Normal breath sounds.  Abdominal:     General: Abdomen is flat. There is no distension.     Palpations: Abdomen is soft.     Tenderness:  There is no abdominal tenderness. There is no guarding or rebound.   Musculoskeletal:     Comments: Chest wall stable nontender.  Pelvis stable, tenderness to the left hip.  5 of 5 bicep strength tricep strength.  Able to wiggle toes.  Has 2+ DP pulses bilaterally.  Left lower extremity with shortening and external rotation.   Skin:    General: Skin is warm and dry.     Capillary Refill: Capillary refill takes less than 2 seconds.   Neurological:     General: No focal deficit present.     Mental Status: He is alert.   Psychiatric:        Mood and Affect: Mood normal.        Behavior: Behavior normal.     (all labs ordered are listed, but only abnormal results are displayed) Labs Reviewed  COMPREHENSIVE METABOLIC PANEL WITH GFR - Abnormal; Notable for the following components:      Result Value   Glucose,  Bld 109 (*)    Albumin 2.9 (*)    All other components within normal limits  CBC - Abnormal; Notable for the following components:   RBC 4.15 (*)    Hemoglobin 12.9 (*)    All other components within normal limits  URINALYSIS, ROUTINE W REFLEX MICROSCOPIC - Abnormal; Notable for the following components:   Hgb urine dipstick SMALL (*)    Protein, ur 30 (*)    Nitrite POSITIVE (*)    Leukocytes,Ua SMALL (*)    All other components within normal limits  PROTIME-INR - Abnormal; Notable for the following components:   Prothrombin Time 15.7 (*)    All other components within normal limits  CBC  BASIC METABOLIC PANEL WITH GFR  I-STAT CG4 LACTIC ACID, ED    EKG: EKG Interpretation Date/Time:  Monday October 31 2023 15:14:18 EDT Ventricular Rate:  66 PR Interval:  175 QRS Duration:  115 QT Interval:  434 QTC Calculation: 455 R Axis:   -68  Text Interpretation: Sinus rhythm Incomplete RBBB and LAFB Confirmed by Elise Guile [phone]) on 10/31/2023 3:24:36 PM  Radiology: Lenell Query Knee Left Port Result Date: 10/31/2023 CLINICAL DATA:  Status post fall. EXAM: PORTABLE LEFT KNEE - 1-2 VIEW COMPARISON:  None Available. FINDINGS: No evidence of an acute fracture, dislocation, or joint effusion. Mild to moderate severity tricompartmental degenerative changes are seen. There is marked severity vascular calcification. Soft tissues are otherwise unremarkable. IMPRESSION: Degenerative changes without evidence of an acute osseous abnormality. Electronically Signed   By: Virgle Grime M.D.   On: 10/31/2023 18:50   CT HEAD WO CONTRAST Result Date: 10/31/2023 CLINICAL DATA:  Head trauma EXAM: CT HEAD WITHOUT CONTRAST CT CERVICAL SPINE WITHOUT CONTRAST TECHNIQUE: Multidetector CT imaging of the head and cervical spine was performed following the standard protocol without intravenous contrast. Multiplanar CT image reconstructions of the cervical spine were also generated. RADIATION DOSE REDUCTION: This exam was  performed according to the departmental dose-optimization program which includes automated exposure control, adjustment of the mA and/or kV according to patient size and/or use of iterative reconstruction technique. COMPARISON:  CT brain 04/11/2023 FINDINGS: CT HEAD FINDINGS Brain: No acute territorial infarction, hemorrhage, or intracranial mass. Chronic cerebellar and right thalamic infarcts. Atrophy and mild chronic small vessel ischemic changes of the white matter. Stable ventricle size Vascular: No hyperdense vessels.  Carotid vascular calcification Skull: Normal. Negative for fracture or focal lesion. Sinuses/Orbits: No acute finding. Other: None CT CERVICAL SPINE FINDINGS Alignment: No subluxation.  Facet  alignment is within normal limits. Skull base and vertebrae: No acute fracture. No primary bone lesion or focal pathologic process. Soft tissues and spinal canal: No prevertebral fluid or swelling. No visible canal hematoma. Disc levels: Advanced disc space narrowing and degenerative change C5-C6 and C6-C7 with moderate disc space narrowing at C7-T1. Multilevel facet degenerative changes. Upper chest: Apical scarring Other: None IMPRESSION: 1. No CT evidence for acute intracranial abnormality. Atrophy and chronic small vessel ischemic changes of the white matter. Chronic cerebellar and right thalamic infarcts. 2. Degenerative changes of the cervical spine. No acute osseous abnormality. Electronically Signed   By: Esmeralda Hedge M.D.   On: 10/31/2023 16:02   CT CERVICAL SPINE WO CONTRAST Result Date: 10/31/2023 CLINICAL DATA:  Head trauma EXAM: CT HEAD WITHOUT CONTRAST CT CERVICAL SPINE WITHOUT CONTRAST TECHNIQUE: Multidetector CT imaging of the head and cervical spine was performed following the standard protocol without intravenous contrast. Multiplanar CT image reconstructions of the cervical spine were also generated. RADIATION DOSE REDUCTION: This exam was performed according to the departmental  dose-optimization program which includes automated exposure control, adjustment of the mA and/or kV according to patient size and/or use of iterative reconstruction technique. COMPARISON:  CT brain 04/11/2023 FINDINGS: CT HEAD FINDINGS Brain: No acute territorial infarction, hemorrhage, or intracranial mass. Chronic cerebellar and right thalamic infarcts. Atrophy and mild chronic small vessel ischemic changes of the white matter. Stable ventricle size Vascular: No hyperdense vessels.  Carotid vascular calcification Skull: Normal. Negative for fracture or focal lesion. Sinuses/Orbits: No acute finding. Other: None CT CERVICAL SPINE FINDINGS Alignment: No subluxation.  Facet alignment is within normal limits. Skull base and vertebrae: No acute fracture. No primary bone lesion or focal pathologic process. Soft tissues and spinal canal: No prevertebral fluid or swelling. No visible canal hematoma. Disc levels: Advanced disc space narrowing and degenerative change C5-C6 and C6-C7 with moderate disc space narrowing at C7-T1. Multilevel facet degenerative changes. Upper chest: Apical scarring Other: None IMPRESSION: 1. No CT evidence for acute intracranial abnormality. Atrophy and chronic small vessel ischemic changes of the white matter. Chronic cerebellar and right thalamic infarcts. 2. Degenerative changes of the cervical spine. No acute osseous abnormality. Electronically Signed   By: Esmeralda Hedge M.D.   On: 10/31/2023 16:02   DG Chest Port 1 View Result Date: 10/31/2023 CLINICAL DATA:  Trauma patient.  Left hip pain after falling. EXAM: PORTABLE CHEST 1 VIEW COMPARISON:  Radiographs 05/01/2023 and 04/08/2023.  CT 09/17/2019. FINDINGS: 1513 hours. The heart size and mediastinal contours are stable with mild cardiomegaly and aortic atherosclerosis. There is chronic lung disease with architectural distortion, subpleural reticulation and scattered scarring, similar to prior imaging. No superimposed airspace disease,  pleural effusion or pneumothorax. Loop recorder overlies the lower left chest. Patient is status post lower thoracic spinal augmentation. IMPRESSION: Grossly stable chronic lung disease without evidence of acute cardiopulmonary process. Electronically Signed   By: Elmon Hagedorn M.D.   On: 10/31/2023 15:35   DG Hip Port Unilat W or Wo Pelvis 1 View Left Result Date: 10/31/2023 CLINICAL DATA:  Post fall and trauma EXAM: DG HIP (WITH OR WITHOUT PELVIS) 1V PORT LEFT COMPARISON:  None Available. FINDINGS: Left hip intertrochanteric fracture with butterfly fragment of the lesser trochanter IMPRESSION: Left hip fracture Electronically Signed   By: Fredrich Jefferson M.D.   On: 10/31/2023 15:32   DG Pelvis Portable Result Date: 10/31/2023 CLINICAL DATA:  Status post fall EXAM: PORTABLE PELVIS 1-2 VIEWS COMPARISON:  None Available. FINDINGS: Left intertrochanteric  fracture with avulsion fracture of the lesser trochanter with minimal varus deformity IMPRESSION: Left hip fracture Electronically Signed   By: Fredrich Jefferson M.D.   On: 10/31/2023 15:32     Procedures   Medications Ordered in the ED  HYDROcodone-acetaminophen  (NORCO/VICODIN) 5-325 MG per tablet 1-2 tablet (1 tablet Oral Given 10/31/23 2203)  methocarbamol (ROBAXIN) tablet 500 mg (500 mg Oral Given 10/31/23 2202)    Or  methocarbamol (ROBAXIN) injection 500 mg ( Intravenous See Alternative 10/31/23 2202)  docusate sodium  (COLACE) capsule 100 mg (100 mg Oral Given 10/31/23 2205)  polyethylene glycol (MIRALAX  / GLYCOLAX ) packet 17 g (has no administration in time range)  bisacodyl  (DULCOLAX) EC tablet 5 mg (has no administration in time range)  hydrALAZINE (APRESOLINE) injection 5 mg (has no administration in time range)  fentaNYL (SUBLIMAZE) injection 25 mcg (25 mcg Intravenous Given by Other 10/31/23 1522)  morphine (PF) 2 MG/ML injection 2 mg (2 mg Intravenous Given 10/31/23 1700)  lactated ringers infusion ( Intravenous New Bag/Given 10/31/23 1914)     Clinical Course as of 10/31/23 2332  Mon Oct 31, 2023  1459 Met on arrival.  Mechanical fall on Plavix .  Hit his head, no LOC.  Alert and oriented.  Evaluated standard ATLS fashion.  No obvious signs of trauma to head.  Chest wall stable nontender.  Benign abdominal exam.  Pelvis with some left-sided tenderness.  There is some external rotation and shortening of the left lower extremity.  He has equal pulses in all extremities.  Neurovascularly intact.  Will get screening labs as well as CT head, C-spine and chest pelvis x-ray.  EMS reported giving fentanyl prior to arrival with improvement to patients hip pain.  [TY]  1529 DG Chest Port 1 View Does not appear to have a pneumothorax on my independent review. [TY]  1529 DG Pelvis Portable On my independent review left femoral neck fracture.  Pelvis appears to be otherwise intact.  Will get dedicated hip x-ray [TY]  1534 Spoke with Ortho, PA Jeffries.  Recommending medicine admission.  Likely operative repair tomorrow. [TY]  1557 CT HEAD WO CONTRAST No obvious intracranial hemorrhage on my review of images.  [TY]  1623 IMPRESSION: 1. No CT evidence for acute intracranial abnormality. Atrophy and chronic small vessel ischemic changes of the white matter. Chronic cerebellar and right thalamic infarcts. 2. Degenerative changes of the cervical spine. No acute osseous abnormality.   Electronically Signed   [TY]  [phone] Spoke with admitting service, Dr. Lydia Sams who agrees to admit patient. [TY]    Clinical Course User Index [TY] Rolinda Climes, DO                                 Medical Decision Making 89 year old presented as a trauma activation for fall on Plavix .  EMS reported given fentanyl prior to arrival and stable vitals.  Concern for possible hip fracture on exam.  Does not appear to have other significant signs of trauma.  He is mentating well without focal neurodeficits.  His fall seemingly mechanical in nature.  Basic labs and CT  scans ordered.  See ED course for further MDM and final disposition.  Amount and/or Complexity of Data Reviewed Independent Historian: EMS    Details: Reported stable vitals and given fentanyl prior to arrival External Data Reviewed:     Details: Appears to be take 75 mg of Plavix .  Complex past medical history to include  hypertension, stroke, hard of hearing Labs: ordered.    Details: No anemia.  No leukocytosis.  No significant metabolic derangements. Radiology: ordered and independent interpretation performed. Decision-making details documented in ED Course.    Details: Hip fracture ECG/medicine tests: ordered and independent interpretation performed. Decision-making details documented in ED Course. Discussion of management or test interpretation with external provider(s): Ortho; operative pair tomorrow.  Admitting service Dr. Lydia Sams.  Risk Prescription drug management. Decision regarding hospitalization. Diagnosis or treatment significantly limited by social determinants of health.      Final diagnoses:  Closed fracture of left hip, initial encounter [facility])    ED Discharge Orders     None          Rolinda Climes, DO 10/31/23 2332

## 2023-10-31 NOTE — Consult Note (Signed)
 Reason for Consult:Left hip fx Referring Physician: Elise Guile Time called: 1524 Time at bedside: 1546   Samuel Mata is an 89 y.o. male.  HPI: Samuel Mata was walking in his kitchen when he lost his balance and fell. He had immediate left hip pain and could not get up. He was brought to the ED where x-rays showed a left hip fx and orthopedic surgery was consulted. He lives at home with his wife and usually uses a RW for ambulation.  Past Medical History:  Diagnosis Date   Bronchiectasis without complication (HCC)    Enlarged prostate    Hearing loss    Hypertension    Pulmonary nocardiosis (HCC) 2021   treated with 6 month antibiotic course   Renal stones    Stroke (HCC) 2022   left frontoparietal   TIA (transient ischemic attack) 2019    Past Surgical History:  Procedure Laterality Date   APPENDECTOMY     CHOLECYSTECTOMY     LOOP RECORDER INSERTION N/A 01/15/2021   Procedure: LOOP RECORDER INSERTION;  Surgeon: Jolly Needle, MD;  Location: [facility] INVASIVE CV LAB;  Service: Cardiovascular;  Laterality: N/A;   NOSE SURGERY     SPINE SURGERY     compressoin fx T12, kyphoplasty   SPLENECTOMY, TOTAL      Family History  Problem Relation Age of Onset   Lung disease Neg Hx     Social History:  reports that he quit smoking about 39 years ago. His smoking use included cigarettes. He started smoking about 69 years ago. He has a 30 pack-year smoking history. He has never used smokeless tobacco. He reports that he does not drink alcohol  and does not use drugs.  Allergies:  Allergies  Allergen Reactions   Codeine     Unknown reaction    Medications: I have reviewed the patient's current medications.  Results for orders placed or performed during the hospital encounter of 10/31/23 (from the past 48 hours)  I-Stat Lactic Acid, ED     Status: None   Collection Time: 10/31/23  3:12 PM  Result Value Ref Range   Lactic Acid, Venous 1.0 0.5 - 1.9 mmol/L  CBC     Status: Abnormal    Collection Time: 10/31/23  3:20 PM  Result Value Ref Range   WBC 8.7 4.0 - 10.5 K/uL   RBC 4.15 (L) 4.22 - 5.81 MIL/uL   Hemoglobin 12.9 (L) 13.0 - 17.0 g/dL   HCT 16.1 09.6 - 04.5 %   MCV 95.9 80.0 - 100.0 fL   MCH 31.1 26.0 - 34.0 pg   MCHC 32.4 30.0 - 36.0 g/dL   RDW 40.9 81.1 - 91.4 %   Platelets 322 150 - 400 K/uL   nRBC 0.0 0.0 - 0.2 %    Comment: Performed at [facility], [address] [place], [place] [address]  Protime-INR     Status: Abnormal   Collection Time: 10/31/23  3:20 PM  Result Value Ref Range   Prothrombin Time 15.7 (H) 11.4 - 15.2 seconds   INR 1.2 0.8 - 1.2    Comment: (NOTE) INR goal varies based on device and disease states. Performed at [facility], [address], [place], [place] [address]     DG Chest Port 1 View Result Date: 10/31/2023 CLINICAL DATA:  Trauma patient.  Left hip pain after falling. EXAM: PORTABLE CHEST 1 VIEW COMPARISON:  Radiographs 05/01/2023 and 04/08/2023.  CT 09/17/2019. FINDINGS: 1513 hours. The heart size and  mediastinal contours are stable with mild cardiomegaly and aortic atherosclerosis. There is chronic lung disease with architectural distortion, subpleural reticulation and scattered scarring, similar to prior imaging. No superimposed airspace disease, pleural effusion or pneumothorax. Loop recorder overlies the lower left chest. Patient is status post lower thoracic spinal augmentation. IMPRESSION: Grossly stable chronic lung disease without evidence of acute cardiopulmonary process. Electronically Signed   By: Elmon Hagedorn M.D.   On: 10/31/2023 15:35   DG Hip Port Unilat W or Wo Pelvis 1 View Left Result Date: 10/31/2023 CLINICAL DATA:  Post fall and trauma EXAM: DG HIP (WITH OR WITHOUT PELVIS) 1V PORT LEFT COMPARISON:  None Available. FINDINGS: Left hip intertrochanteric fracture with butterfly fragment of the lesser trochanter IMPRESSION: Left hip fracture Electronically Signed   By: Fredrich Jefferson M.D.   On:  10/31/2023 15:32   DG Pelvis Portable Result Date: 10/31/2023 CLINICAL DATA:  Status post fall EXAM: PORTABLE PELVIS 1-2 VIEWS COMPARISON:  None Available. FINDINGS: Left intertrochanteric fracture with avulsion fracture of the lesser trochanter with minimal varus deformity IMPRESSION: Left hip fracture Electronically Signed   By: Fredrich Jefferson M.D.   On: 10/31/2023 15:32    Review of Systems  HENT:  Negative for ear discharge, ear pain, hearing loss and tinnitus.   Eyes:  Negative for photophobia and pain.  Respiratory:  Negative for cough and shortness of breath.   Cardiovascular:  Negative for chest pain.  Gastrointestinal:  Negative for abdominal pain, nausea and vomiting.  Genitourinary:  Negative for dysuria, flank pain, frequency and urgency.  Musculoskeletal:  Positive for arthralgias (Left hip). Negative for back pain, myalgias and neck pain.  Neurological:  Negative for dizziness and headaches.  Hematological:  Does not bruise/bleed easily.  Psychiatric/Behavioral:  The patient is not nervous/anxious.    Blood pressure (!) 156/89, pulse 64, temperature 98.2 F (36.8 C), temperature source Oral, resp. rate 20, height 5' 7 (1.702 m), weight 64.4 kg, SpO2 95%. Physical Exam Constitutional:      General: He is not in acute distress.    Appearance: He is well-developed. He is not diaphoretic.  HENT:     Head: Normocephalic and atraumatic.   Eyes:     General: No scleral icterus.       Right eye: No discharge.        Left eye: No discharge.     Conjunctiva/sclera: Conjunctivae normal.    Cardiovascular:     Rate and Rhythm: Normal rate and regular rhythm.  Pulmonary:     Effort: Pulmonary effort is normal. No respiratory distress.   Musculoskeletal:     Cervical back: Normal range of motion.     Comments: LLE No traumatic wounds, ecchymosis, or rash  Severe TTP hip  No knee or ankle effusion  Knee stable to varus/ valgus and anterior/posterior stress  Sens DPN, SPN,  TN intact  Motor EHL, ext, flex, evers 5/5  DP 1+, PT 0, No significant edema   Skin:    General: Skin is warm and dry.   Neurological:     Mental Status: He is alert.   Psychiatric:        Mood and Affect: Mood normal.        Behavior: Behavior normal.     Assessment/Plan: Left hip fx -- Plan IMN tomorrow with Dr. Guyann Leitz. Please keep NPO after MN. Please hold Eliquis tomorrow but can restart Wednesday.    Georganna Kin, PA-C Orthopedic Surgery [phone] 10/31/2023, 3:54 PM

## 2023-10-31 NOTE — ED Notes (Signed)
 CCMD called.

## 2023-10-31 NOTE — Anesthesia Preprocedure Evaluation (Signed)
 Anesthesia Evaluation  Patient identified by MRN, date of birth, ID band Patient awake    Reviewed: Allergy & Precautions, NPO status , Patient's Chart, lab work & pertinent test results  Airway Mallampati: II  TM Distance: >3 FB Neck ROM: Full    Dental no notable dental hx.    Pulmonary COPD, former smoker   Pulmonary exam normal        Cardiovascular hypertension, + dysrhythmias + pacemaker  Rhythm:Regular Rate:Normal     Neuro/Psych TIACVA  negative psych ROS   GI/Hepatic Neg liver ROS,GERD  ,,  Endo/Other  negative endocrine ROS    Renal/GU   negative genitourinary   Musculoskeletal Hip fx 2/2 fall   Abdominal Normal abdominal exam  (+)   Peds  Hematology  (+) Blood dyscrasia, anemia Lab Results      Component                Value               Date                      WBC                      8.7                 10/31/2023                HGB                      12.9 (L)            10/31/2023                HCT                      39.8                10/31/2023                MCV                      95.9                10/31/2023                PLT                      322                 10/31/2023              Anesthesia Other Findings   Reproductive/Obstetrics                             Anesthesia Physical Anesthesia Plan  ASA: 3  Anesthesia Plan: Regional   Post-op Pain Management:    Induction: Intravenous  PONV Risk Score and Plan: 1 and Treatment may vary due to age or medical condition  Airway Management Planned: Simple Face Mask, Natural Airway and Nasal Cannula  Additional Equipment: None  Intra-op Plan:   Post-operative Plan:   Informed Consent: I have reviewed the patients History and Physical, chart, labs and discussed the procedure including the risks, benefits and alternatives for the proposed anesthesia with the patient or authorized representative  who has indicated his/her understanding and  acceptance.     Dental advisory given  Plan Discussed with:   Anesthesia Plan Comments:        Anesthesia Quick Evaluation

## 2023-11-01 ENCOUNTER — Inpatient Hospital Stay (HOSPITAL_COMMUNITY): Payer: Self-pay | Admitting: Anesthesiology

## 2023-11-01 ENCOUNTER — Inpatient Hospital Stay (HOSPITAL_COMMUNITY)

## 2023-11-01 ENCOUNTER — Encounter (HOSPITAL_COMMUNITY)
Admission: EM | Disposition: A | Discharge status: Rehabilitation Facility | Payer: Self-pay | Source: Home / Self Care | Attending: Internal Medicine

## 2023-11-01 ENCOUNTER — Other Ambulatory Visit: Payer: Self-pay

## 2023-11-01 ENCOUNTER — Encounter (HOSPITAL_COMMUNITY): Payer: Self-pay | Admitting: Internal Medicine

## 2023-11-01 DIAGNOSIS — E440 Moderate protein-calorie malnutrition: Secondary | ICD-10-CM | POA: Insufficient documentation

## 2023-11-01 DIAGNOSIS — Z87891 Personal history of nicotine dependence: Secondary | ICD-10-CM

## 2023-11-01 DIAGNOSIS — J449 Chronic obstructive pulmonary disease, unspecified: Secondary | ICD-10-CM

## 2023-11-01 DIAGNOSIS — I10 Essential (primary) hypertension: Secondary | ICD-10-CM | POA: Diagnosis not present

## 2023-11-01 DIAGNOSIS — S72142A Displaced intertrochanteric fracture of left femur, initial encounter for closed fracture: Secondary | ICD-10-CM

## 2023-11-01 DIAGNOSIS — W19XXXA Unspecified fall, initial encounter: Secondary | ICD-10-CM | POA: Diagnosis not present

## 2023-11-01 DIAGNOSIS — Y92009 Unspecified place in unspecified non-institutional (private) residence as the place of occurrence of the external cause: Secondary | ICD-10-CM | POA: Diagnosis not present

## 2023-11-01 LAB — CBC
HCT: 37.7 % — ABNORMAL LOW (ref 39.0–52.0)
Hemoglobin: 12.4 g/dL — ABNORMAL LOW (ref 13.0–17.0)
MCH: 31.1 pg (ref 26.0–34.0)
MCHC: 32.9 g/dL (ref 30.0–36.0)
MCV: 94.5 fL (ref 80.0–100.0)
Platelets: 279 10*3/uL (ref 150–400)
RBC: 3.99 MIL/uL — ABNORMAL LOW (ref 4.22–5.81)
RDW: 12.4 % (ref 11.5–15.5)
WBC: 8.2 10*3/uL (ref 4.0–10.5)
nRBC: 0 % (ref 0.0–0.2)

## 2023-11-01 LAB — BASIC METABOLIC PANEL WITH GFR
Anion gap: 8 (ref 5–15)
BUN: 19 mg/dL (ref 8–23)
CO2: 24 mmol/L (ref 22–32)
Calcium: 8.7 mg/dL — ABNORMAL LOW (ref 8.9–10.3)
Chloride: 104 mmol/L (ref 98–111)
Creatinine, Ser: 0.9 mg/dL (ref 0.61–1.24)
GFR, Estimated: >60 mL/min (ref >60)
Glucose, Bld: 124 mg/dL — ABNORMAL HIGH (ref 70–99)
Potassium: 4.1 mmol/L (ref 3.5–5.1)
Sodium: 136 mmol/L (ref 135–145)

## 2023-11-01 SURGERY — FIXATION, FRACTURE, INTERTROCHANTERIC, WITH INTRAMEDULLARY ROD
Anesthesia: General | Laterality: Left

## 2023-11-01 MED ORDER — LIDOCAINE 2% (20 MG/ML) 5 ML SYRINGE
INTRAMUSCULAR | Status: AC
Start: 2023-11-01 — End: 2023-11-01
  Filled 2023-11-01: qty 5

## 2023-11-01 MED ORDER — ONDANSETRON HCL 4 MG/2ML IJ SOLN
INTRAMUSCULAR | Status: DC | PRN
  Administered 2023-11-01: 4 mg via INTRAVENOUS

## 2023-11-01 MED ORDER — SUGAMMADEX SODIUM 200 MG/2ML IV SOLN
INTRAVENOUS | Status: DC | PRN
  Administered 2023-11-01: 200 mg via INTRAVENOUS

## 2023-11-01 MED ORDER — PHENYLEPHRINE HCL-NACL 20-0.9 MG/250ML-% IV SOLN
INTRAVENOUS | Status: DC | PRN
  Administered 2023-11-01: 20 ug/min via INTRAVENOUS

## 2023-11-01 MED ORDER — GLYCOPYRROLATE 0.2 MG/ML IJ SOLN
INTRAMUSCULAR | Status: DC | PRN
Start: 2023-11-01 — End: 2023-11-01
  Administered 2023-11-01: .2 mg via INTRAVENOUS

## 2023-11-01 MED ORDER — ONDANSETRON HCL 4 MG/2ML IJ SOLN
INTRAMUSCULAR | Status: AC
Start: 2023-11-01 — End: 2023-11-01
  Filled 2023-11-01: qty 2

## 2023-11-01 MED ORDER — METOCLOPRAMIDE HCL 5 MG/ML IJ SOLN: 5.0000 mg | Freq: Three times a day (TID) | INTRAMUSCULAR | Status: DC | PRN

## 2023-11-01 MED ORDER — CEFAZOLIN SODIUM-DEXTROSE 2-4 GM/100ML-% IV SOLN
2.0000 g | Freq: Four times a day (QID) | INTRAVENOUS | Status: AC
  Administered 2023-11-01 – 2023-11-02 (×2): 2 g via INTRAVENOUS
  Filled 2023-11-01 (×2): qty 100

## 2023-11-01 MED ORDER — FENTANYL CITRATE (PF) 100 MCG/2ML IJ SOLN
INTRAMUSCULAR | Status: AC
  Filled 2023-11-01: qty 2

## 2023-11-01 MED ORDER — PHENOL 1.4 % MT LIQD
1.0000 | OROMUCOSAL | Status: DC | PRN
  Filled 2023-11-01: qty 177

## 2023-11-01 MED ORDER — CHLORHEXIDINE GLUCONATE 0.12 % MT SOLN
15.0000 mL | Freq: Once | OROMUCOSAL | Status: AC
  Administered 2023-11-01: 15 mL via OROMUCOSAL

## 2023-11-01 MED ORDER — CLOPIDOGREL BISULFATE 75 MG PO TABS
75.0000 mg | ORAL_TABLET | Freq: Every day | ORAL | Status: DC
  Administered 2023-11-02 – 2023-11-05 (×3): 75 mg via ORAL
  Filled 2023-11-01 (×4): qty 1

## 2023-11-01 MED ORDER — ONDANSETRON HCL 4 MG/2ML IJ SOLN: 4.0000 mg | Freq: Four times a day (QID) | INTRAMUSCULAR | Status: DC | PRN

## 2023-11-01 MED ORDER — DOCUSATE SODIUM 100 MG PO CAPS: 100.0000 mg | ORAL_CAPSULE | Freq: Two times a day (BID) | ORAL | Status: DC

## 2023-11-01 MED ORDER — GLYCOPYRROLATE PF 0.2 MG/ML IJ SOSY
PREFILLED_SYRINGE | INTRAMUSCULAR | Status: AC
  Filled 2023-11-01: qty 1

## 2023-11-01 MED ORDER — MENTHOL 3 MG MT LOZG
1.0000 | LOZENGE | OROMUCOSAL | Status: DC | PRN
  Administered 2023-11-02: 3 mg via ORAL
  Filled 2023-11-01: qty 9

## 2023-11-01 MED ORDER — ROCURONIUM BROMIDE 10 MG/ML (PF) SYRINGE
PREFILLED_SYRINGE | INTRAVENOUS | Status: AC
  Filled 2023-11-01: qty 10

## 2023-11-01 MED ORDER — DEXAMETHASONE SODIUM PHOSPHATE 10 MG/ML IJ SOLN
INTRAMUSCULAR | Status: DC | PRN
  Administered 2023-11-01: 5 mg via INTRAVENOUS

## 2023-11-01 MED ORDER — CEFAZOLIN SODIUM-DEXTROSE 2-4 GM/100ML-% IV SOLN
2.0000 g | INTRAVENOUS | Status: AC
Start: 2023-11-01 — End: 2023-11-01
  Administered 2023-11-01: 2 g via INTRAVENOUS
  Filled 2023-11-01: qty 100

## 2023-11-01 MED ORDER — ACETAMINOPHEN 500 MG PO TABS
500.0000 mg | ORAL_TABLET | Freq: Three times a day (TID) | ORAL | Status: DC
  Administered 2023-11-02 – 2023-11-04 (×5): 500 mg via ORAL
  Filled 2023-11-01 (×9): qty 1

## 2023-11-01 MED ORDER — TRANEXAMIC ACID-NACL 1000-0.7 MG/100ML-% IV SOLN
1000.0000 mg | INTRAVENOUS | Status: AC
  Administered 2023-11-01: 1000 mg via INTRAVENOUS
  Filled 2023-11-01: qty 100

## 2023-11-01 MED ORDER — LACTATED RINGERS IV SOLN: INTRAVENOUS | Status: DC

## 2023-11-01 MED ORDER — OXYCODONE HCL 5 MG PO TABS: 5.0000 mg | ORAL_TABLET | Freq: Once | ORAL | Status: DC | PRN

## 2023-11-01 MED ORDER — FENTANYL CITRATE (PF) 100 MCG/2ML IJ SOLN
25.0000 ug | INTRAMUSCULAR | Status: DC | PRN
  Administered 2023-11-01: 25 ug via INTRAVENOUS

## 2023-11-01 MED ORDER — ONDANSETRON HCL 4 MG PO TABS
4.0000 mg | ORAL_TABLET | Freq: Four times a day (QID) | ORAL | Status: DC | PRN
  Administered 2023-11-04: 4 mg via ORAL
  Filled 2023-11-01 (×2): qty 1

## 2023-11-01 MED ORDER — FENTANYL CITRATE (PF) 250 MCG/5ML IJ SOLN
INTRAMUSCULAR | Status: DC | PRN
  Administered 2023-11-01: 25 ug via INTRAVENOUS
  Administered 2023-11-01 (×2): 50 ug via INTRAVENOUS

## 2023-11-01 MED ORDER — METOCLOPRAMIDE HCL 5 MG PO TABS: 5.0000 mg | ORAL_TABLET | Freq: Three times a day (TID) | ORAL | Status: DC | PRN

## 2023-11-01 MED ORDER — DEXAMETHASONE SODIUM PHOSPHATE 10 MG/ML IJ SOLN
INTRAMUSCULAR | Status: AC
  Filled 2023-11-01: qty 1

## 2023-11-01 MED ORDER — OXYCODONE HCL 5 MG/5ML PO SOLN: 5.0000 mg | Freq: Once | ORAL | Status: DC | PRN

## 2023-11-01 MED ORDER — AMISULPRIDE (ANTIEMETIC) 5 MG/2ML IV SOLN: 10.0000 mg | Freq: Once | INTRAVENOUS | Status: DC | PRN

## 2023-11-01 MED ORDER — FENTANYL CITRATE (PF) 250 MCG/5ML IJ SOLN
INTRAMUSCULAR | Status: AC
  Filled 2023-11-01: qty 5

## 2023-11-01 MED ORDER — LIDOCAINE 2% (20 MG/ML) 5 ML SYRINGE
INTRAMUSCULAR | Status: DC | PRN
  Administered 2023-11-01: 80 mg via INTRAVENOUS

## 2023-11-01 MED ORDER — PROPOFOL 500 MG/50ML IV EMUL
INTRAVENOUS | Status: DC | PRN
  Administered 2023-11-01: 40 ug/kg/min via INTRAVENOUS

## 2023-11-01 MED ORDER — PROPOFOL 10 MG/ML IV BOLUS
INTRAVENOUS | Status: DC | PRN
  Administered 2023-11-01: 100 mg via INTRAVENOUS

## 2023-11-01 MED ORDER — CHLORHEXIDINE GLUCONATE 4 % EX SOLN
60.0000 mL | Freq: Once | CUTANEOUS | Status: AC
  Administered 2023-11-01: 4 via TOPICAL
  Filled 2023-11-01: qty 60

## 2023-11-01 MED ORDER — ROCURONIUM BROMIDE 10 MG/ML (PF) SYRINGE
PREFILLED_SYRINGE | INTRAVENOUS | Status: DC | PRN
  Administered 2023-11-01: 60 mg via INTRAVENOUS

## 2023-11-01 MED ORDER — ORAL CARE MOUTH RINSE: 15.0000 mL | Freq: Once | OROMUCOSAL | Status: AC

## 2023-11-01 MED ORDER — PROPOFOL 10 MG/ML IV BOLUS
INTRAVENOUS | Status: AC
  Filled 2023-11-01: qty 20

## 2023-11-01 MED ORDER — POVIDONE-IODINE 10 % EX SWAB
2.0000 | Freq: Once | CUTANEOUS | Status: AC
  Administered 2023-11-01: 2 via TOPICAL

## 2023-11-01 MED ORDER — ACETAMINOPHEN 500 MG PO TABS
1000.0000 mg | ORAL_TABLET | Freq: Once | ORAL | Status: AC
  Administered 2023-11-01: 1000 mg via ORAL
  Filled 2023-11-01: qty 2

## 2023-11-01 MED ORDER — 0.9 % SODIUM CHLORIDE (POUR BTL) OPTIME
TOPICAL | Status: DC | PRN
Start: 2023-11-01 — End: 2023-11-01
  Administered 2023-11-01: 1000 mL

## 2023-11-01 SURGICAL SUPPLY — 43 items
BAG COUNTER SPONGE SURGICOUNT (BAG) ×1 IMPLANT
BIT DRILL CROWE POINT TWST 4.3 (DRILL) IMPLANT
BNDG COHESIVE 6X5 TAN ST LF (GAUZE/BANDAGES/DRESSINGS) IMPLANT
BRUSH SCRUB EZ PLAIN DRY (MISCELLANEOUS) ×2 IMPLANT
COVER PERINEAL POST (MISCELLANEOUS) ×1 IMPLANT
COVER SURGICAL LIGHT HANDLE (MISCELLANEOUS) ×2 IMPLANT
DRAPE C-ARM 42X72 X-RAY (DRAPES) ×1 IMPLANT
DRAPE C-ARMOR (DRAPES) ×1 IMPLANT
DRAPE HALF SHEET 40X57 (DRAPES) IMPLANT
DRAPE INCISE IOBAN 66X45 STRL (DRAPES) ×1 IMPLANT
DRAPE SURG ORHT 6 SPLT 77X108 (DRAPES) IMPLANT
DRAPE U-SHAPE 47X51 STRL (DRAPES) ×1 IMPLANT
DRESSING MEPILEX FLEX 4X4 (GAUZE/BANDAGES/DRESSINGS) ×1 IMPLANT
DRSG EMULSION OIL 3X3 NADH (GAUZE/BANDAGES/DRESSINGS) ×1 IMPLANT
DRSG MEPILEX POST OP 4X8 (GAUZE/BANDAGES/DRESSINGS) ×1 IMPLANT
ELECTRODE REM PT RTRN 9FT ADLT (ELECTROSURGICAL) ×1 IMPLANT
GLOVE BIO SURGEON STRL SZ7.5 (GLOVE) ×1 IMPLANT
GLOVE BIO SURGEON STRL SZ8 (GLOVE) ×1 IMPLANT
GLOVE BIOGEL PI IND STRL 7.5 (GLOVE) ×1 IMPLANT
GLOVE BIOGEL PI IND STRL 8 (GLOVE) ×1 IMPLANT
GLOVE SURG ORTHO LTX SZ7.5 (GLOVE) ×2 IMPLANT
GOWN STRL REUS W/ TWL LRG LVL3 (GOWN DISPOSABLE) ×2 IMPLANT
GOWN STRL REUS W/ TWL XL LVL3 (GOWN DISPOSABLE) ×1 IMPLANT
GUIDEPIN VERSANAIL DSP 3.2X444 (ORTHOPEDIC DISPOSABLE SUPPLIES) IMPLANT
GUIDEWIRE BALL NOSE 100CM (WIRE) IMPLANT
KIT BASIN OR (CUSTOM PROCEDURE TRAY) ×1 IMPLANT
KIT TURNOVER KIT B (KITS) ×1 IMPLANT
MANIFOLD NEPTUNE II (INSTRUMENTS) ×1 IMPLANT
NAIL HIP FRACT LT 130D 11X400 (Nail) IMPLANT
NS IRRIG 1000ML POUR BTL (IV SOLUTION) ×1 IMPLANT
PACK GENERAL/GYN (CUSTOM PROCEDURE TRAY) ×1 IMPLANT
PAD ARMBOARD POSITIONER FOAM (MISCELLANEOUS) ×2 IMPLANT
SCREW CORTICL BON 5.0MM X 48MM (Screw) IMPLANT
SCREW LAG 10.5MMX105MM HFN (Screw) IMPLANT
STAPLER SKIN PROX 35W (STAPLE) ×1 IMPLANT
STOCKINETTE IMPERVIOUS LG (DRAPES) IMPLANT
SUT ETHILON 2 0 PSLX (SUTURE) ×1 IMPLANT
SUT VIC AB 0 CT1 27XBRD ANBCTR (SUTURE) ×1 IMPLANT
SUT VIC AB 1 CT1 27XBRD ANBCTR (SUTURE) ×1 IMPLANT
SUT VIC AB 2-0 CT1 TAPERPNT 27 (SUTURE) ×1 IMPLANT
TOWEL GREEN STERILE (TOWEL DISPOSABLE) ×2 IMPLANT
TOWEL GREEN STERILE FF (TOWEL DISPOSABLE) ×1 IMPLANT
WATER STERILE IRR 1000ML POUR (IV SOLUTION) ×1 IMPLANT

## 2023-11-01 NOTE — Progress Notes (Signed)
 Initial Nutrition Assessment  DOCUMENTATION CODES:   Non-severe (moderate) malnutrition in context of acute illness/injury  INTERVENTION:   When diet advanced, recommend: Room service with assist  Encourage increased protein intake  Ensure Plus High Protein po BID, each supplement provides 350 kcal and 20 grams of protein MVI with minerals daily High calorie, high protein nutrition education and handout provided to pt and family  NUTRITION DIAGNOSIS:   Moderate Malnutrition related to acute illness as evidenced by mild muscle depletion, mild fat depletion.  GOAL:   Patient will meet greater than or equal to 90% of their needs   MONITOR:   Diet advancement, PO intake, Supplement acceptance, Skin, Labs  REASON FOR ASSESSMENT:   Consult Hip fracture protocol  ASSESSMENT:   89 y.o Male with PMH of HTN, HLD, stroke, h/o hereditary spherocytosis s/p splenectomy, pulmonary nocardiosis, bronchiectasis, nephrolithiasis, BPH. Admitted after a fall found to have left hip fracture.  6/17 - surgical fixation hip   Pt lying in bed with wife at bedside. History was obtained from both pt and spouse. Pt is HOH.   Pt is supposed to use a rolling walker for ambulation but wife reports he usually does not want to use it. Was walking in the kitchen and fell, was unable to get up.   Pt lives at home with his wife. PTA pt states he was eating like a horse. Eating 3 meals per day with lots of snacking in between. Wife reports pt had a stroke last year and started to lose weight afterwards to his lowest of 130 lbs. She attributes this to pt not eating as much but she states he still had a great appetite.  In the past couple of months pt's intake has increased and he has been able to gain weight. Current weight shows a 12 lb weight gain. On exam pt still with mild muscle and fat wasting.   RD provided High-Calorie High-Protein Nutrition Therapy handout from the [employer]. Reviewed patient's dietary recall. Provided examples on ways to increase caloric density and protein  of foods and beverages frequently consumed by the patient. Also provided ideas to promote variety and to incorporate additional nutrient dense foods into patient's diet. Discussed eating small frequent meals and snacks to assist in increasing overall po intake. Teach back method used.  Dietary recall: Breakfast: Almonds, raisin bran, 2 pieces of cinnamon raisin bran with toast Lunch: Chicken salad sandwich  Dinner: Meat, with startch, and vegetables Snack: PB + crackers, cheese and crackers   Admit weight: 64.4 kg  Current weight: 64.4 kg    Average Meal Intake: NPO  Nutritionally Relevant Medications: Scheduled Meds:  [MAR Hold] docusate sodium   100 mg Oral BID   Continuous Infusions:  lactated ringers 10 mL/hr at 11/01/23 1439   Labs Reviewed: Calcium  8.7 Albumin 2.9 CBG ranges from 109-124 mg/dL over the last 24 hours HgbA1c 5.4 (2024)  NUTRITION - FOCUSED PHYSICAL EXAM:  Flowsheet Row Most Recent Value  Orbital Region Mild depletion  Upper Arm Region No depletion  Thoracic and Lumbar Region No depletion  Buccal Region Mild depletion  Temple Region Mild depletion  Clavicle Bone Region Mild depletion  Clavicle and Acromion Bone Region Mild depletion  Scapular Bone Region Mild depletion  Dorsal Hand Mild depletion  Patellar Region No depletion  Anterior Thigh Region No depletion  Posterior Calf Region No depletion  Edema (RD Assessment) None  Hair Reviewed  Eyes Reviewed  Mouth Reviewed  Skin Reviewed  Nails Reviewed    Diet Order:   Diet Order             Diet NPO time specified Except for: Sips with Meds, Ice Chips  Diet effective midnight                   EDUCATION NEEDS:   Education needs have been addressed  Skin:  Skin Assessment: Reviewed RN Assessment  Last BM:  PTA  Height:   Ht Readings from Last 1 Encounters:  11/01/23  5' 7 (1.702 m)    Weight:   Wt Readings from Last 1 Encounters:  11/01/23 64.4 kg    BMI:  Body mass index is 22.24 kg/m.  Estimated Nutritional Needs:   Kcal:  1600-1800 kcal  Protein:  90-110 gm  Fluid:  >1.6L/day   Frederik Jansky, RD Registered Dietitian  See Amion for more information

## 2023-11-01 NOTE — Transfer of Care (Signed)
 Immediate Anesthesia Transfer of Care Note  Patient: Samuel Mata  Procedure(s) Performed: FIXATION, FRACTURE, INTERTROCHANTERIC, WITH INTRAMEDULLARY ROD (Left)  Patient Location: PACU  Anesthesia Type:General  Level of Consciousness: drowsy  Airway & Oxygen Therapy: Patient Spontanous Breathing and Patient connected to face mask oxygen  Post-op Assessment: Report given to RN and Post -op Vital signs reviewed and stable  Post vital signs: Reviewed and stable  Last Vitals:  Vitals Value Taken Time  BP 147/84 11/01/23 16:30  Temp    Pulse 71 11/01/23 16:31  Resp 18 11/01/23 16:31  SpO2 98 % 11/01/23 16:31  Vitals shown include unfiled device data.  Last Pain:  Vitals:   11/01/23 1340  TempSrc:   PainSc: 0-No pain         Complications: No notable events documented.

## 2023-11-01 NOTE — Anesthesia Procedure Notes (Signed)
 Procedure Name: Intubation Date/Time: 11/01/2023 2:49 PM  Performed by: Artemisa Bile D, CRNAPre-anesthesia Checklist: Patient identified, Emergency Drugs available, Suction available and Patient being monitored Patient Re-evaluated:Patient Re-evaluated prior to induction Oxygen Delivery Method: Circle System Utilized Preoxygenation: Pre-oxygenation with 100% oxygen Induction Type: IV induction Ventilation: Mask ventilation without difficulty Laryngoscope Size: Mac and 4 Grade View: Grade I Tube type: Oral Tube size: 7.5 mm Number of attempts: 1 Airway Equipment and Method: Stylet and Oral airway Placement Confirmation: ETT inserted through vocal cords under direct vision, positive ETCO2 and breath sounds checked- equal and bilateral Tube secured with: Tape Dental Injury: Teeth and Oropharynx as per pre-operative assessment

## 2023-11-01 NOTE — Progress Notes (Signed)
 No changes overnight.  The risks and benefits of let hip fracture repair were discussed with the patient, including the possibility of infection, nerve injury, vessel injury, wound breakdown, arthritis, symptomatic hardware, DVT/ PE, loss of motion, malunion, nonunion, and need for further surgery among others. These risks were acknowledged and consent was provided to proceed.  Hardy Lia, MD [facility], [facility] [phone]

## 2023-11-01 NOTE — Anesthesia Postprocedure Evaluation (Signed)
 Anesthesia Post Note  Patient: Samuel Mata  Procedure(s) Performed: FIXATION, FRACTURE, INTERTROCHANTERIC, WITH INTRAMEDULLARY ROD (Left)     Patient location during evaluation: PACU Anesthesia Type: General Level of consciousness: oriented, patient cooperative and sedated Pain management: pain level controlled Vital Signs Assessment: post-procedure vital signs reviewed and stable Respiratory status: spontaneous breathing, nonlabored ventilation, respiratory function stable and patient connected to nasal cannula oxygen Cardiovascular status: blood pressure returned to baseline and stable Postop Assessment: no apparent nausea or vomiting Anesthetic complications: no  No notable events documented.  Last Vitals:  Vitals:   11/01/23 1700 11/01/23 1715  BP: 125/78 (!) 143/81  Pulse: 74 64  Resp: 19 16  Temp:  36.5 C  SpO2: 95% 99%    Last Pain:  Vitals:   11/01/23 1630  TempSrc:   PainSc: Asleep                 Bishoy Cupp,E. Quinnie Barcelo

## 2023-11-01 NOTE — Anesthesia Preprocedure Evaluation (Addendum)
 Anesthesia Evaluation  Patient identified by MRN, date of birth, ID band Patient awake    Reviewed: Allergy & Precautions, NPO status , Patient's Chart, lab work & pertinent test results  Airway Mallampati: II  TM Distance: >3 FB     Dental no notable dental hx. (+) Dental Advisory Given, Caps, Teeth Intact   Pulmonary sleep apnea , COPD, former smoker   Pulmonary exam normal breath sounds clear to auscultation       Cardiovascular hypertension, Pt. on medications Normal cardiovascular exam+ Valvular Problems/Murmurs (mild MR) MR  Rhythm:Regular Rate:Normal  TTE 2024  1. Left ventricular ejection fraction, by estimation, is 55 to 60%. The  left ventricle has normal function. The left ventricle has no regional  wall motion abnormalities. Left ventricular diastolic parameters were  normal.   2. Right ventricular systolic function is normal. The right ventricular  size is normal. There is normal pulmonary artery systolic pressure.   3. The mitral valve is abnormal. Mild mitral valve regurgitation. No  evidence of mitral stenosis. There is mild holosystolic prolapse of  multiple scallops of the posterior leaflet of the mitral valve.   4. The aortic valve is tricuspid. Aortic valve regurgitation is not  visualized. No aortic stenosis is present.   5. The inferior vena cava is normal in size with greater than 50%  respiratory variability, suggesting right atrial pressure of 3 mmHg.   6. Agitated saline contrast bubble study was negative, with no evidence  of any interatrial shunt.     Neuro/Psych TIACVA  negative psych ROS   GI/Hepatic Neg liver ROS,GERD  ,,  Endo/Other  negative endocrine ROS    Renal/GU negative Renal ROS  negative genitourinary   Musculoskeletal negative musculoskeletal ROS (+)    Abdominal   Peds  Hematology  (+) Blood dyscrasia (plavix  LD 10/31/23)   Anesthesia Other Findings    Reproductive/Obstetrics                              Anesthesia Physical Anesthesia Plan  ASA: 3  Anesthesia Plan: General   Post-op Pain Management: Tylenol  PO (pre-op)*   Induction: Intravenous  PONV Risk Score and Plan: 2 and Dexamethasone, Ondansetron and Treatment may vary due to age or medical condition  Airway Management Planned: Oral ETT  Additional Equipment:   Intra-op Plan:   Post-operative Plan: Extubation in OR  Informed Consent: I have reviewed the patients History and Physical, chart, labs and discussed the procedure including the risks, benefits and alternatives for the proposed anesthesia with the patient or authorized representative who has indicated his/her understanding and acceptance.     Dental advisory given  Plan Discussed with: CRNA and Anesthesiologist  Anesthesia Plan Comments:         Anesthesia Quick Evaluation

## 2023-11-01 NOTE — Plan of Care (Signed)
 Assumed care at 1900. Pt has been resting in bed comfortably overnight. Pt had c/o pain, see MAR. Pt awoke this AM feeling confused, unaware of his location and situation. This RN reoriented patient multiple times this AM. Fall precautions in place.    Problem: Education: Goal: Knowledge of General Education information will improve Description: Including pain rating scale, medication(s)/side effects and non-pharmacologic comfort measures Outcome: Progressing   Problem: Clinical Measurements: Goal: Ability to maintain clinical measurements within normal limits will improve Outcome: Progressing Goal: Will remain free from infection Outcome: Progressing Goal: Diagnostic test results will improve Outcome: Progressing Goal: Respiratory complications will improve Outcome: Progressing Goal: Cardiovascular complication will be avoided Outcome: Progressing   Problem: Elimination: Goal: Will not experience complications related to bowel motility Outcome: Progressing Goal: Will not experience complications related to urinary retention Outcome: Progressing   Problem: Pain Managment: Goal: General experience of comfort will improve and/or be controlled Outcome: Progressing   Problem: Safety: Goal: Ability to remain free from injury will improve Outcome: Progressing

## 2023-11-01 NOTE — Progress Notes (Signed)
 PROGRESS NOTE  Samuel Mata  DOB: December 20, 1930  PCP: Shepperson, Kirstin, PA-C MVH:[id number]  DOA: 10/31/2023  LOS: 1 day  Hospital Day: 2  Brief narrative: Samuel Mata is a 89 y.o. male with PMH significant for HTN, HLD, stroke, h/o hereditary spherocytosis s/p splenectomy, pulmonary nocardiosis, bronchiectasis, nephrolithiasis, BPH, past history of smoking Patient lives at home with his wife.  At baseline has poor balance, supposed to use a rolling walker for ambulation but per family, he usually chooses not to. 6/16, patient was walking in his kitchen when he lost his balance and fell, sustained immediate left hip pain and could not get up.  In the ED, patient was afebrile, hemodynamically stable Blood work mostly unremarkable Urinalysis showed clear yellow urine with positive nitrite, small leukocytes. CTA did not show any evidence of acute intracranial abnormality.  It showed atrophy and chronic small vessel ischemic changes of the white matter. Chronic cerebellar and right thalamic infarcts. CT cervical spine did not show any acute fracture or subluxation Chest x-ray did not show any acute abnormality, stable chronic findings X-ray pelvis showed left hip fracture X-ray left knee showed degenerative changes without any acute fracture.  Orthopedics was consulted Admitted to [facility]  Subjective: Patient was seen and examined this morning. Elderly Caucasian male.  Lying down in bed.  On low-flow oxygen for comfort.  Family not at bedside. She is hard of hearing but able to have a meaningful conversation. Chart reviewed Remains hemodynamically stable  Assessment and plan: Left intertrochanteric fracture Secondary to fall from loss of balance Imaging as above. Orthopedics consulted. Surgical intervention planned for today Pain regimen: As needed oxycodone , as needed morphine, as needed Robaxin Bowel regimen: Scheduled Colace, as needed MiraLAX  DVT prophylaxis: SCDs for  now  Chronic gait impairment At baseline, supposed to use walker but prefers not to per family.   PT eval postop  H/o stroke, HLD Plavix  on hold preop Continue Crestor   Essential hypertension PTA meds-recently switched from amlodipine  to Benicar 5 mg daily.  Keep ARB on hold preop  COPD As needed bronchodilators  h/o hereditary spherocytosis  s/p prior splenectomy  GERD Pepcid   BPH Flomax  0.4 mg daily    Mobility: Needs PT eval postprocedure  Goals of care   Code Status: Full Code     DVT prophylaxis:  SCDs Start: 10/31/23 1803   Antimicrobials: None Consultants: Orthopedics Family Communication: None at bedside  Status: Inpatient Level of care:  Telemetry Medical   Patient is from: Home Needs to continue in-hospital care: Pending hip surgery Anticipated d/c to: Pending clinical course, surgical fixation hip    Diet:  Diet Order             Diet NPO time specified Except for: Sips with Meds, Ice Chips  Diet effective midnight                   Scheduled Meds:  docusate sodium   100 mg Oral BID   povidone-iodine  2 Application Topical Once    PRN meds: bisacodyl , hydrALAZINE, HYDROcodone-acetaminophen , methocarbamol **OR** methocarbamol (ROBAXIN) injection, polyethylene glycol   Infusions:    ceFAZolin (ANCEF) IV     tranexamic acid      Antimicrobials: Anti-infectives (From admission, onward)    Start     Dose/Rate Route Frequency Ordered Stop   11/01/23 0700  ceFAZolin (ANCEF) IVPB 2g/100 mL premix        2 g 200 mL/hr over 30 Minutes Intravenous On call to O.R. 11/01/23 8413 11/02/23  0559       Objective: Vitals:   11/01/23 0455 11/01/23 0803  BP: 131/71 (!) 120/58  Pulse: (!) 56 (!) 57  Resp: 18 17  Temp: 99 F (37.2 C) 98 F (36.7 C)  SpO2: 95% 96%    Intake/Output Summary (Last 24 hours) at 11/01/2023 1023 Last data filed at 10/31/2023 2027 Gross per 24 hour  Intake --  Output 100 ml  Net -100 ml   Filed  Weights   10/31/23 1459  Weight: 64.4 kg   Weight change:  Body mass index is 22.24 kg/m.   Physical Exam: General exam: Pleasant, elderly Caucasian male. Skin: No rashes, lesions or ulcers. HEENT: Atraumatic, normocephalic, no obvious bleeding Lungs: Clear to auscultation bilaterally,  CVS: S1, S2, no murmur,   GI/Abd: Soft, nontender, nondistended, bowel sound present,   CNS: Alert, weak, oriented x 3.  Hard of hearing Psychiatry: Mood appropriate,  Extremities: No pedal edema, no calf tenderness,   Data Review: I have personally reviewed the laboratory data and studies available.  F/u labs  Unresulted Labs (From admission, onward)     Start     Ordered   11/02/23 0500  CBC with Differential/Platelet  Daily,   R      11/01/23 1023   11/02/23 0500  Basic metabolic panel with GFR  Daily,   R      11/01/23 1023   11/01/23 0112  Surgical PCR screen  Once,   R        11/01/23 0112            Signed, Hoyt Macleod, MD [facility] 11/01/2023

## 2023-11-01 NOTE — Op Note (Signed)
 11/01/2023  PATIENT:  Samuel Mata  03-18-1931 male   MEDICAL RECORD NUMBER: [id number]  PRE-OPERATIVE DIAGNOSIS: Left three-part intertrochanteric hip fracture  POST-OPERATIVE DIAGNOSIS:  Left three-part intertrochanteric hip fracture  PROCEDURE:  INTRAMEDULLARY NAILING OF THE LEFT HIP using a statically locked [employer] Affixus nail 11 X 400.  SURGEON:  Homero Luster. Guyann Leitz, M.D.  ASSISTANT:  PA student.  ANESTHESIA:  General.  COMPLICATIONS:  None.  ESTIMATED BLOOD LOSS:  Less than 100 mL.  DISPOSITION:  To PACU.  CONDITION:  Stable.  DELAY START OF DVT PROPHYLAXIS BECAUSE OF BLEEDING RISK: NO  BRIEF SUMMARY AND INDICATION OF PROCEDURE:  Samuel Mata is a 89 y.o. year- old with multiple medical problems.  I discussed with the patient the risks and benefits of surgical treatment including the potential for malunion, nonunion, symptomatic hardware, heart attack, stroke, neurovascular injury, bleeding, and others.  After acknowledging these risks, consent was provided consent to proceed.  BRIEF SUMMARY OF PROCEDURE:  The patient was taken to the operating room where general anesthesia was induced.  He was positioned supine on the Hana fracture table.  A closed reduction maneuver was performed of the fractured proximal femur and this was confirmed on both AP and lateral xray views. A thorough scrub and wash with chlorhexidine  and then Betadine scrub and paint was performed.  After sterile drapes and time-out, a long instrument was used to identify the appropriate starting position under C-arm on both AP and lateral images.  A 3 cm incision was made proximal to the greater trochanter.  The curved cannulated awl was inserted just medial to the tip of the lateral trochanter and then the starting guidewire advanced into the proximal femur.  This was checked on AP and lateral views.  The starting reamer was engaged with the soft tissue protected by a sleeve.  The curved ball-tipped guidewire  was then inserted, making sure it was just posterior as possible in the distal femur and across the fracture site, which stayed in a reduced position.  It was sequentially reamed up to 13 and an 11 x 400 mm nail inserted to the appropriate depth.  The guidewire for the lag screw was then inserted with the appropriate anteversion to make sure it was in a center-center position.  An additional pin was placed to prevent rotation.  The lag screw pin was measured and the lag screw placed with excellent purchase and position checked on both views.  The set screw was then engaged within the groove of the lag screw, which was allowed to telescope.  The antirotation pin was removed and then traction was released and compression achieved with the compression device over the lag screw.  This was followed by placement of one distal locking screw using perfect circle technique.  This was confirmed on AP and lateral images. Wounds were irrigated thoroughly, closed in a standard layered fashion. Sterile gently compressive dressings were applied.  A PA student assisted throughout.  The patient was awakened from anesthesia and transported to the PACU in stable condition.  PROGNOSIS:  The patient will be weightbearing as tolerated with physical therapy resuming antiplatelet therapy tomorrow.  I think he would be an ideal candidate for placement in the rehab center at discharge from the hospital. He has no range of motion precautions.  We will continue to follow through at the hospital.  Anticipate follow up in the office in 2 weeks for removal of sutures and further evaluation.     Homero Luster. Guyann Leitz, M.D.

## 2023-11-02 ENCOUNTER — Encounter (HOSPITAL_COMMUNITY): Payer: Self-pay | Admitting: Orthopedic Surgery

## 2023-11-02 LAB — CBC WITH DIFFERENTIAL/PLATELET
Abs Immature Granulocytes: 0.07 10*3/uL (ref 0.00–0.07)
Basophils Absolute: 0 10*3/uL (ref 0.0–0.1)
Basophils Relative: 0 %
Eosinophils Absolute: 0 10*3/uL (ref 0.0–0.5)
Eosinophils Relative: 0 %
HCT: 36.1 % — ABNORMAL LOW (ref 39.0–52.0)
Hemoglobin: 11.7 g/dL — ABNORMAL LOW (ref 13.0–17.0)
Immature Granulocytes: 1 %
Lymphocytes Relative: 6 %
Lymphs Abs: 0.8 10*3/uL (ref 0.7–4.0)
MCH: 30.9 pg (ref 26.0–34.0)
MCHC: 32.4 g/dL (ref 30.0–36.0)
MCV: 95.3 fL (ref 80.0–100.0)
Monocytes Absolute: 1.8 10*3/uL — ABNORMAL HIGH (ref 0.1–1.0)
Monocytes Relative: 14 %
Neutro Abs: 10.2 10*3/uL — ABNORMAL HIGH (ref 1.7–7.7)
Neutrophils Relative %: 79 %
Platelets: 260 10*3/uL (ref 150–400)
RBC: 3.79 MIL/uL — ABNORMAL LOW (ref 4.22–5.81)
RDW: 12.5 % (ref 11.5–15.5)
WBC: 12.9 10*3/uL — ABNORMAL HIGH (ref 4.0–10.5)
nRBC: 0 % (ref 0.0–0.2)

## 2023-11-02 LAB — BASIC METABOLIC PANEL WITH GFR
Anion gap: 8 (ref 5–15)
BUN: 24 mg/dL — ABNORMAL HIGH (ref 8–23)
CO2: 25 mmol/L (ref 22–32)
Calcium: 8.5 mg/dL — ABNORMAL LOW (ref 8.9–10.3)
Chloride: 102 mmol/L (ref 98–111)
Creatinine, Ser: 1.15 mg/dL (ref 0.61–1.24)
GFR, Estimated: >60 mL/min (ref >60)
Glucose, Bld: 123 mg/dL — ABNORMAL HIGH (ref 70–99)
Potassium: 4.7 mmol/L (ref 3.5–5.1)
Sodium: 135 mmol/L (ref 135–145)

## 2023-11-02 LAB — VITAMIN D 25 HYDROXY (VIT D DEFICIENCY, FRACTURES): Vit D, 25-Hydroxy: 8.51 ng/mL — ABNORMAL LOW (ref 30–100)

## 2023-11-02 NOTE — Progress Notes (Addendum)
 Transition of Care [facility]) - Inpatient Brief Assessment   Patient Details  Name: Geza Beranek MRN: [id number] Date of Birth: 23-Nov-1930  Transition of [facility]) CM/SW Contact:    Dane Dung, RN Phone Number: 11/02/2023, 12:38 PM   Clinical Narrative: CM met with the patient at the bedside to discuss TOC needs.  The patient fell and home and is S/P hip surgery.  Patient states that he hopes to be able to admit to [facility] Inpatient rehabilitation facility.  Patient was seen by PT this morning and rehabilitation consult was recommended at this time.  Patient lives at home with his wife and has 24 hour supervision/care through family at home.    Patient has DME at home that includes RW, shower chair, and elevated toilet seat.  Patient is not oxygen post-op in the hospital but is normally on RA.  TOC team will continue to follow the patient for TOC needs - including CIR admission if patient is suitable for their rehabilitation services - currently pending Rehab eval at this time.  6/18 - CIR CM states that Rehab MD plans to assess patient in the am for potential CIr admit   Transition of Care Asessment: Insurance and Status: (P) Insurance coverage has been reviewed Patient has primary care physician: (P) Yes Home environment has been reviewed: (P) from home with spouse Prior level of function:: (P) RW Prior/Current Home Services: (P) No current home services Social Drivers of Health Review: (P) SDOH reviewed needs interventions Readmission risk has been reviewed: (P) Yes Transition of care needs: (P) transition of care needs identified, TOC will continue to follow

## 2023-11-02 NOTE — Progress Notes (Addendum)
  Inpatient Rehabilitation Admissions Coordinator   Met with patient  at bedside for rehab assessment. We discussed goals and expectations of a possible CIR admit. He was admitted to [facility] 11/24 and returned home at CGA/Supervision level with his wife. He prefers CIR for rehab. I will contact his daughter, Aron Lard to discuss and assess candidacy. Please call me with any questions.   Jeannetta Millman, RN, MSN Rehab Admissions Coordinator [phone]   I spoke with Aron Lard, daughter, by phone. She would like to proceed with pursuing CIR admit. I will have Rehab MD to assess patient tomorrow and then will begin Auth with [facility] for possible admit.. Wife can provide supervision level and they can hire caregiver support 2 to 3 hrs per day at home to assist as needed with physical care.  Jeannetta Millman, RN, MSN Rehab Admissions Coordinator [phone] 11/02/2023 3:19 PM

## 2023-11-02 NOTE — Plan of Care (Signed)
 Assumed care at 1900. Pt has been resting comfortably in bed overnight. Pt was able to eat a little dinner overnight. Complaints of pain this morning, see MAR. Oxygen decreased to 1L this AM. No significant events overnight.    Problem: Clinical Measurements: Goal: Ability to maintain clinical measurements within normal limits will improve Outcome: Progressing Goal: Will remain free from infection Outcome: Progressing Goal: Diagnostic test results will improve Outcome: Progressing Goal: Respiratory complications will improve Outcome: Progressing Goal: Cardiovascular complication will be avoided Outcome: Progressing   Problem: Nutrition: Goal: Adequate nutrition will be maintained Outcome: Progressing   Problem: Coping: Goal: Level of anxiety will decrease Outcome: Progressing   Problem: Elimination: Goal: Will not experience complications related to bowel motility Outcome: Progressing Goal: Will not experience complications related to urinary retention Outcome: Progressing   Problem: Pain Managment: Goal: General experience of comfort will improve and/or be controlled Outcome: Progressing   Problem: Safety: Goal: Ability to remain free from injury will improve Outcome: Progressing   Problem: Activity: Goal: Ability to ambulate and perform ADLs will improve Outcome: Progressing

## 2023-11-02 NOTE — Evaluation (Signed)
 Occupational Therapy Evaluation Patient Details Name: Samuel Mata MRN: [id number] DOB: 11/17/1930 Today's Date: 11/02/2023   History of Present Illness   Pt is a 89 y/o M presenting to ED On 6/16 after fall, found to have L hip fx s/p IMN on 6/17. WBAT. PMH includes bronchiectasis, essential HTN, stroke, nephrolithaisis, nocardiosis pulmonary     Clinical Impressions Pt reports ind at baseline with mobility, had some assist for ADLs. Pt currently needing min-max A for ADLs, and mod +2 for transfers with RW, painful LLE with buckling. Pt presenting with impairments listed below, will follow acutely. Patient will benefit from intensive inpatient follow-up therapy, >3 hours/day to maximize safety/ind with ADL/functional mobility.      If plan is discharge home, recommend the following:   Two people to help with walking and/or transfers;A lot of help with bathing/dressing/bathroom;Assistance with cooking/housework;Direct supervision/assist for financial management;Direct supervision/assist for medications management;Assist for transportation;Help with stairs or ramp for entrance     Functional Status Assessment   Patient has had a recent decline in their functional status and demonstrates the ability to make significant improvements in function in a reasonable and predictable amount of time.     Equipment Recommendations   Other (comment) (defer)     Recommendations for Other Services   PT consult;Rehab consult     Precautions/Restrictions   Precautions Precautions: Fall Restrictions Weight Bearing Restrictions Per Provider Order: Yes     Mobility Bed Mobility               General bed mobility comments: up with PT upon arrival    Transfers Overall transfer level: Needs assistance Equipment used: Rolling walker (2 wheels) Transfers: Sit to/from Stand Sit to Stand: Mod assist, +2 physical assistance, From elevated surface                   Balance Overall balance assessment: Needs assistance Sitting-balance support: No upper extremity supported, Feet supported Sitting balance-Leahy Scale: Good     Standing balance support: Bilateral upper extremity supported, During functional activity, Reliant on assistive device for balance Standing balance-Leahy Scale: Poor Standing balance comment: relies on device and CGA assist of 2 statically and mod assist dynamically                           ADL either performed or assessed with clinical judgement   ADL Overall ADL's : Needs assistance/impaired Eating/Feeding: Set up;Sitting   Grooming: Minimal assistance;Sitting   Upper Body Bathing: Moderate assistance;Sitting;Bed level   Lower Body Bathing: Maximal assistance;Sitting/lateral leans;Bed level   Upper Body Dressing : Moderate assistance;Sitting;Bed level   Lower Body Dressing: Maximal assistance;Sitting/lateral leans;Bed level   Toilet Transfer: Moderate assistance;+2 for physical assistance   Toileting- Clothing Manipulation and Hygiene: Maximal assistance       Functional mobility during ADLs: Moderate assistance;+2 for physical assistance       Vision   Vision Assessment?: No apparent visual deficits     Perception Perception: Not tested       Praxis Praxis: Not tested       Pertinent Vitals/Pain Pain Assessment Faces Pain Scale: Hurts whole lot Pain Location: left hip Pain Descriptors / Indicators: Aching, Discomfort, Grimacing, Guarding Pain Intervention(s): Limited activity within patient's tolerance     Extremity/Trunk Assessment Upper Extremity Assessment Upper Extremity Assessment: Generalized weakness   Lower Extremity Assessment Lower Extremity Assessment: Defer to PT evaluation LLE Deficits / Details: grossly 3-/5  Communication Communication Communication: Impaired Factors Affecting Communication: Hearing impaired (has hearing aids)   Cognition Arousal:  Alert Behavior During Therapy: Flat affect                                 Following commands: Intact       Cueing  General Comments   Cueing Techniques: Verbal cues;Tactile cues  VSS on 1L O2   Exercises     Shoulder Instructions      Home Living Family/patient expects to be discharged to:: Private residence Living Arrangements: Spouse/significant other Available Help at Discharge: Family;Available 24 hours/day (wife can only provide supervision) Type of Home: House Home Access: Stairs to enter [employer] of Steps: 1 + 1 Entrance Stairs-Rails: None Home Layout: One level     Bathroom Shower/Tub: Chief Strategy Officer: Standard Bathroom Accessibility: Yes   Home Equipment: Cane - single point;Grab bars - tub/shower;Tub bench;Rolling Walker (2 wheels);Toilet riser   Additional Comments: walked around neighborhood every day      Prior Functioning/Environment Prior Level of Function : Independent/Modified Independent             Mobility Comments: ind without device since CVA about 8 months ago but did have some balance impairment per daughter ADLs Comments: aide was helping pt with shower 1 day a week;  wife completes iADls    OT Problem List: Decreased strength;Decreased range of motion;Decreased activity tolerance;Impaired balance (sitting and/or standing);Decreased cognition;Decreased safety awareness;Cardiopulmonary status limiting activity   OT Treatment/Interventions: Self-care/ADL training;Therapeutic exercise;Energy conservation;DME and/or AE instruction;Therapeutic activities;Patient/family education;Balance training      OT Goals(Current goals can be found in the care plan section)   Acute Rehab OT Goals Patient Stated Goal: none stated OT Goal Formulation: With patient Time For Goal Achievement: 11/16/23 Potential to Achieve Goals: Good ADL Goals Pt Will Perform Upper Body Dressing: with  supervision;sitting Pt Will Perform Lower Body Dressing: with min assist;sitting/lateral leans;with adaptive equipment;sit to/from stand Pt Will Transfer to Toilet: with min assist;ambulating;regular height toilet Pt Will Perform Tub/Shower Transfer: Tub transfer;Shower transfer;with min assist;ambulating;shower seat Additional ADL Goal #1: pt will perform bed mobility supervision in prep for ADLs   OT Frequency:  Min 2X/week    Co-evaluation   Reason for Co-Treatment: For patient/therapist safety PT goals addressed during session: Mobility/safety with mobility        AM-PAC OT 6 Clicks Daily Activity     Outcome Measure Help from another person eating meals?: A Little Help from another person taking care of personal grooming?: A Little Help from another person toileting, which includes using toliet, bedpan, or urinal?: A Lot Help from another person bathing (including washing, rinsing, drying)?: A Lot Help from another person to put on and taking off regular upper body clothing?: A Little Help from another person to put on and taking off regular lower body clothing?: A Lot 6 Click Score: 15   End of Session Equipment Utilized During Treatment: Gait belt;Rolling walker (2 wheels);Oxygen Nurse Communication: Mobility status  Activity Tolerance: Patient tolerated treatment well Patient left: in chair;with call bell/phone within reach;with chair alarm set;with family/visitor present  OT Visit Diagnosis: Unsteadiness on feet (R26.81);Other abnormalities of gait and mobility (R26.89);Muscle weakness (generalized) (M62.81)                Time: 0981-1914 OT Time Calculation (min): 22 min Charges:  OT General Charges $OT Visit: 1 Visit OT Evaluation $  OT Eval Moderate Complexity: 1 Mod  Cullen Vanallen K, OTD, OTR/L [employer]Chat Preferred Acute Rehab [phone]   Antionette Kirks 11/02/2023, 12:59 PM

## 2023-11-02 NOTE — Evaluation (Signed)
 Physical Therapy Evaluation Patient Details Name: Samuel Mata MRN: [id number] DOB: 1931-04-19 Today's Date: 11/02/2023  History of Present Illness  Pt is a 89 y/o M presenting to ED On 6/16 after fall, found to have L hip fx s/p IMN on 6/17. WBAT. PMH includes bronchiectasis, essential HTN, stroke, nephrolithaisis, nocardiosis pulmonary  Clinical Impression  Pt admitted with above diagnosis. Pt was able to ambulate with RW with mod assist of 2 persons for safety as his left knee buckles due to weakness and pain. Pt has supportive family. Wife can provide supervision once home. Recommend post acute rehab > 3 hours day to maximize independence once home.  Pt currently with functional limitations due to the deficits listed below (see PT Problem List). Pt will benefit from acute skilled PT to increase their independence and safety with mobility to allow discharge.           If plan is discharge home, recommend the following: A little help with walking and/or transfers;A little help with bathing/dressing/bathroom;Assistance with cooking/housework;Assist for transportation;Help with stairs or ramp for entrance;Supervision due to cognitive status;Direct supervision/assist for medications management;Direct supervision/assist for financial management   Can travel by private vehicle        Equipment Recommendations None recommended by PT  Recommendations for Other Services  Rehab consult    Functional Status Assessment Patient has had a recent decline in their functional status and demonstrates the ability to make significant improvements in function in a reasonable and predictable amount of time.     Precautions / Restrictions Precautions Precautions: Fall Restrictions Weight Bearing Restrictions Per Provider Order: Yes LLE Weight Bearing Per Provider Order: Weight bearing as tolerated      Mobility  Bed Mobility Overal bed mobility: Needs Assistance Bed Mobility: Supine to Sit      Supine to sit: Mod assist, HOB elevated, Used rails     General bed mobility comments: Needed assistr for Left LE as well as trunk and used rails.    Transfers Overall transfer level: Needs assistance Equipment used: Rolling walker (2 wheels) Transfers: Sit to/from Stand Sit to Stand: Mod assist, +2 physical assistance, From elevated surface           General transfer comment: Pt required mod assist of 2 to rise and steady as he has difficulty with weight bearing on left LE.  Can stand statically with CGA with RW.    Ambulation/Gait Ambulation/Gait assistance: Mod assist, +2 physical assistance Gait Distance (Feet): 8 Feet Assistive device: Rolling walker (2 wheels) Gait Pattern/deviations: Step-to pattern, Decreased step length - left, Decreased stance time - left, Decreased weight shift to left, Knee flexed in stance - left, Shuffle, Antalgic, Knees buckling, Trunk flexed   Gait velocity interpretation: <1.31 ft/sec, indicative of household ambulator   General Gait Details: Pt needed cues to sequence steps and RW. Pt having difficulty weight bearing on left LE due to pain and daughter states this leg was weaker prior to the fall. Pt needing mod assist of 2 as left knee buckled x 3 with steps due to weakness and pain.  Limited distance due to this.  .  Stairs            Wheelchair Mobility     Tilt Bed    Modified Rankin (Stroke Patients Only)       Balance Overall balance assessment: Needs assistance Sitting-balance support: No upper extremity supported, Feet supported Sitting balance-Leahy Scale: Good     Standing balance support: Bilateral upper extremity supported,  During functional activity, Reliant on assistive device for balance Standing balance-Leahy Scale: Poor Standing balance comment: relies on device and CGA assist of 2 statically and mod assist dynamically                             Pertinent Vitals/Pain Pain Assessment Pain  Assessment: Faces Faces Pain Scale: Hurts whole lot Pain Location: left hip Pain Descriptors / Indicators: Aching, Discomfort, Grimacing, Guarding Pain Intervention(s): Limited activity within patient's tolerance, Monitored during session, Repositioned    Home Living Family/patient expects to be discharged to:: Private residence Living Arrangements: Spouse/significant other Available Help at Discharge: Family;Available 24 hours/day (wife can provide supervision only) Type of Home: House Home Access: Stairs to enter Entrance Stairs-Rails: None Entrance Stairs-Number of Steps: 1 + 1   Home Layout: One level Home Equipment: Cane - single point;Grab bars - tub/shower;Tub bench;Rolling Walker (2 wheels);Toilet riser Additional Comments: walked around neighborhood every day    Prior Function Prior Level of Function : Independent/Modified Independent             Mobility Comments: ind without device since CVA about 8 months ago but did have some balance impairment per daughter ADLs Comments: aide was helping pt with shower 1 day a week;  wife completes iADls     Extremity/Trunk Assessment   Upper Extremity Assessment Upper Extremity Assessment: Defer to OT evaluation    Lower Extremity Assessment Lower Extremity Assessment: LLE deficits/detail LLE Deficits / Details: grossly 3-/5       Communication   Communication Communication: Impaired Factors Affecting Communication: Hearing impaired    Cognition Arousal: Alert Behavior During Therapy: Flat affect   PT - Cognitive impairments: Safety/Judgement                         Following commands: Intact       Cueing Cueing Techniques: Verbal cues, Tactile cues     General Comments General comments (skin integrity, edema, etc.): 93% 1LO2 on arrival, 63 bpm, Desat to 86% on RA. therefore replaced O2 and pt needed 2LO2 to keep above 885 with activity and back to 1LO2 once in chair with sats 92%.     Exercises Total Joint Exercises Ankle Circles/Pumps: AROM, Both, 10 reps, Supine Quad Sets: AROM, Both, 10 reps, Supine Short Arc Quad: AROM, Left, 10 reps, Supine Heel Slides: AAROM, Left, 10 reps, Supine Hip ABduction/ADduction: AROM, Left, 10 reps, Supine Straight Leg Raises: AAROM, Left, 10 reps, Supine Long Arc Quad: AROM, Left, Seated, 5 reps   Assessment/Plan    PT Assessment Patient needs continued PT services  PT Problem List Decreased activity tolerance;Decreased balance;Decreased mobility;Decreased strength;Decreased range of motion;Decreased knowledge of use of DME;Decreased safety awareness;Decreased knowledge of precautions;Pain       PT Treatment Interventions DME instruction;Gait training;Functional mobility training;Therapeutic activities;Therapeutic exercise;Balance training;Patient/family education;Stair training    PT Goals (Current goals can be found in the Care Plan section)  Acute Rehab PT Goals Patient Stated Goal: to go home after rehab PT Goal Formulation: With patient/family Time For Goal Achievement: 11/16/23 Potential to Achieve Goals: Good    Frequency Min 3X/week     Co-evaluation PT/OT/SLP Co-Evaluation/Treatment: Yes Reason for Co-Treatment: For patient/therapist safety PT goals addressed during session: Mobility/safety with mobility         AM-PAC PT 6 Clicks Mobility  Outcome Measure Help needed turning from your back to your side while in a flat  bed without using bedrails?: A Little Help needed moving from lying on your back to sitting on the side of a flat bed without using bedrails?: Total Help needed moving to and from a bed to a chair (including a wheelchair)?: Total Help needed standing up from a chair using your arms (e.g., wheelchair or bedside chair)?: Total Help needed to walk in hospital room?: Total Help needed climbing 3-5 steps with a railing? : Total 6 Click Score: 8    End of Session Equipment Utilized During  Treatment: Gait belt;Oxygen Activity Tolerance: Patient limited by fatigue;Patient limited by pain Patient left: in chair;with call bell/phone within reach;with chair alarm set;with family/visitor present Nurse Communication: Mobility status PT Visit Diagnosis: Muscle weakness (generalized) (M62.81);Unsteadiness on feet (R26.81);Other abnormalities of gait and mobility (R26.89);Pain Pain - Right/Left: Left Pain - part of body: Hip    Time: 4098-1191 PT Time Calculation (min) (ACUTE ONLY): 46 min   Charges:   PT Evaluation $PT Eval Moderate Complexity: 1 Mod PT Treatments $Gait Training: 8-22 mins PT General Charges $$ ACUTE PT VISIT: 1 Visit         Zyrah Wiswell M,PT Acute Rehab Services [phone]   Florencia Hunter 11/02/2023, 12:18 PM

## 2023-11-02 NOTE — Progress Notes (Signed)
 Progress Note   Patient: Samuel Mata ZOX:[id number] DOB: 04/20/1931 DOA: 10/31/2023     2 DOS: the patient was seen and examined on 11/02/2023   Brief hospital course: Matei Magnone is a 89 y.o. male with PMH significant for HTN, HLD, stroke, h/o hereditary spherocytosis s/p splenectomy, pulmonary nocardiosis, bronchiectasis, nephrolithiasis, BPH, past history of smoking Patient lives at home with his wife.  At baseline has poor balance, supposed to use a rolling walker for ambulation but per family, he usually chooses not to. 6/16, patient was walking in his kitchen when he lost his balance and fell, sustained immediate left hip pain and could not get up.   In the ED, patient was afebrile, hemodynamically stable Blood work mostly unremarkable Urinalysis showed clear yellow urine with positive nitrite, small leukocytes. CTA did not show any evidence of acute intracranial abnormality.  It showed atrophy and chronic small vessel ischemic changes of the white matter. Chronic cerebellar and right thalamic infarcts. CT cervical spine did not show any acute fracture or subluxation Chest x-ray did not show any acute abnormality, stable chronic findings X-ray pelvis showed left hip fracture X-ray left knee showed degenerative changes without any acute fracture.   Orthopedics was consulted Admitted to [facility]  Assessment and Plan:  Left acute intertrochanteric fracture: INTRAMEDULLARY NAILING OF THE LEFT HIP using a statically locked [employer] nail 11 X 400. POD 1  Secondary to fall from loss of balance Imaging as above. Orthopedics on board  Pain regimen: As needed oxycodone , as needed morphine, as needed Robaxin Bowel regimen: Scheduled Colace, as needed MiraLAX  DVT prophylaxis: SCDs. Resumed plavix    Chronic gait impairment At baseline, supposed to use walker but prefers not to per family.   PT eval postop   H/o stroke, HLD Will resume plavix  Continue Crestor    Essential  hypertension PTA meds-recently switched from amlodipine  to Benicar 5 mg daily.    COPD As needed bronchodilators   h/o hereditary spherocytosis  s/p prior splenectomy   GERD Pepcid    BPH Flomax  0.4 mg daily    Mobility: PT OT on board   Goals of care   Code Status: Full Code      Subjective: No active complaints this morning. Daughter at the bedside and requesting IPR. PT OT on board.   Physical Exam: Vitals:   11/01/23 1757 11/01/23 2300 11/02/23 0300 11/02/23 0738  BP: (!) 148/71 102/63 122/65 111/73  Pulse: 60 69 62 69  Resp: 16 16  18   Temp: 97.7 F (36.5 C) 97.9 F (36.6 C) 97.9 F (36.6 C) 97.7 F (36.5 C)  TempSrc: Oral Oral Oral   SpO2: 96% 98% 100% 93%  Weight:      Height:       Constitutional: NAD, calm, comfortable Eyes: PERRL, lids and conjunctivae normal ENMT: Mucous membranes are moist. Posterior pharynx clear of any exudate or lesions.Normal dentition.  Neck: normal, supple, no masses, no thyromegaly Respiratory: clear to auscultation bilaterally, no wheezing, no crackles. Normal respiratory effort. No accessory muscle use.  Cardiovascular: Regular rate and rhythm, no murmurs / rubs / gallops. No extremity edema. 2+ pedal pulses. No carotid bruits.  Abdomen: no tenderness, no masses palpated. No hepatosplenomegaly. Bowel sounds positive.  Musculoskeletal: no clubbing / cyanosis. S/p right hip surgery, dressing in place Skin: no rashes, lesions, ulcers. No induration Neurologic: CN 2-12 grossly intact. Sensation intact, DTR normal.  Psychiatric: Normal judgment and insight. Alert and oriented x 3. Normal mood.   Data Reviewed:  There are  no new results to review at this time.  Family Communication: Daughter at bedside  Disposition: Status is: Inpatient Remains inpatient appropriate because: post hip surgery  Planned Discharge Destination: Rehab    Time spent: 42 minutes  Author: Clancy Crimes, MD 11/02/2023 1:16 PM  For on call  review [email or web address].

## 2023-11-02 NOTE — Progress Notes (Signed)
 Orthopaedic Trauma Service Progress Note  Patient ID: Samuel Mata MRN: [id number] DOB/AGE: [birth year] 89 y.o.  Subjective:  Working with therapy  Doing ok  Daughter present and reports he had a good night with minimal pain   Lives alone with wife Supposed to use walker at baseline   H/o CVA Recently was in CIR post CVA 03/2023  ROS As above  Today's  total administered Morphine Milligram Equivalents: 5 Yesterday's total administered Morphine Milligram Equivalents: 45  Objective:   VITALS:   Vitals:   11/01/23 1757 11/01/23 2300 11/02/23 0300 11/02/23 0738  BP: (!) 148/71 102/63 122/65 111/73  Pulse: 60 69 62 69  Resp: 16 16  18   Temp: 97.7 F (36.5 C) 97.9 F (36.6 C) 97.9 F (36.6 C) 97.7 F (36.5 C)  TempSrc: Oral Oral Oral   SpO2: 96% 98% 100% 93%  Weight:      Height:        Estimated body mass index is 22.24 kg/m as calculated from the following:   Height as of this encounter: 5' 7 (1.702 m).   Weight as of this encounter: 64.4 kg.   Intake/Output      06/17 0701 06/18 0700 06/18 0701 06/19 0700   I.V. (mL/kg) 550 (8.5)    Total Intake(mL/kg) 550 (8.5)    Urine (mL/kg/hr) 500 (0.3)    Total Output 500    Net +50           LABS  Results for orders placed or performed during the hospital encounter of 10/31/23 (from the past 24 hours)  CBC with Differential/Platelet     Status: Abnormal   Collection Time: 11/02/23  3:40 AM  Result Value Ref Range   WBC 12.9 (H) 4.0 - 10.5 K/uL   RBC 3.79 (L) 4.22 - 5.81 MIL/uL   Hemoglobin 11.7 (L) 13.0 - 17.0 g/dL   HCT 11.9 (L) 14.7 - 82.9 %   MCV 95.3 80.0 - 100.0 fL   MCH 30.9 26.0 - 34.0 pg   MCHC 32.4 30.0 - 36.0 g/dL   RDW 56.2 13.0 - 86.5 %   Platelets 260 150 - 400 K/uL   nRBC 0.0 0.0 - 0.2 %   Neutrophils Relative % 79 %   Neutro Abs 10.2 (H) 1.7 - 7.7 K/uL   Lymphocytes Relative 6 %   Lymphs Abs 0.8 0.7 - 4.0 K/uL    Monocytes Relative 14 %   Monocytes Absolute 1.8 (H) 0.1 - 1.0 K/uL   Eosinophils Relative 0 %   Eosinophils Absolute 0.0 0.0 - 0.5 K/uL   Basophils Relative 0 %   Basophils Absolute 0.0 0.0 - 0.1 K/uL   Immature Granulocytes 1 %   Abs Immature Granulocytes 0.07 0.00 - 0.07 K/uL  Basic metabolic panel with GFR     Status: Abnormal   Collection Time: 11/02/23  3:40 AM  Result Value Ref Range   Sodium 135 135 - 145 mmol/L   Potassium 4.7 3.5 - 5.1 mmol/L   Chloride 102 98 - 111 mmol/L   CO2 25 22 - 32 mmol/L   Glucose, Bld 123 (H) 70 - 99 mg/dL   BUN 24 (H) 8 - 23 mg/dL   Creatinine, Ser 7.84 0.61 - 1.24 mg/dL   Calcium  8.5 (L) 8.9 - 10.3 mg/dL  GFR, Estimated >60 >60 mL/min   Anion gap 8 5 - 15     PHYSICAL EXAM:  Gen: moving ok with therapy, NAD  Lungs: sats are 88% on 2L with activity, up to 91% at rest. Breathing is unlabored Ext:        Left Lower Extremity Dressings are intact, dry strikethrough   Extremity is warm  No DCT  Compartments are soft    Assessment/Plan: 1 Day Post-Op   Principal Problem:   Fall at home, initial encounter Active Problems:   Malnutrition of moderate degree   Anti-infectives (From admission, onward)    Start     Dose/Rate Route Frequency Ordered Stop   11/01/23 2100  ceFAZolin (ANCEF) IVPB 2g/100 mL premix        2 g 200 mL/hr over 30 Minutes Intravenous Every 6 hours 11/01/23 1743 11/02/23 0406   11/01/23 0700  ceFAZolin (ANCEF) IVPB 2g/100 mL premix        2 g 200 mL/hr over 30 Minutes Intravenous On call to O.R. 11/01/23 9528 11/01/23 1455     .  POD/HD#: 38  89 year old male fall with left intertrochanteric hip fracture  - Fall with fragility fracture to left hip  - Left intertrochanteric hip fracture, fragility fracture Weightbearing Weight-bear as tolerated left leg with assistance   ROM/Activity   Unrestricted range of motion of left hip and left knee   Wound care   Daily wound care as needed starting  tomorrow   Ice as needed for swelling and pain control  - Pain management:  Multimodal, minimize narcotics  - ABL anemia/Hemodynamics  Stable, monitor  - Medical issues   Per primary  - DVT/PE prophylaxis:  Okay to resume Plavix   SCDs  - ID:   Perioperative antibiotics  - Metabolic Bone Disease:  Fractures of fragility fracture  Referral to outpatient osteoporosis clinic  Vitamin D levels pending  - Activity:  As above  - Impediments to fracture healing:  Age  Fragility fractures/osteoporosis  - Dispo:  Ortho issues stable  Sutures out in approximately 2 weeks  CIR versus SNF   Geroldine Kotyk, PA-C [phone] (C) 11/02/2023, 9:23 AM  [facility] [address] [place] [address] [phone] Deanna [phone] (F)    After 5pm and on the weekends please log on to Amion, go to orthopaedics and the look under the [facility] Call for the provider(s) on call. You can also call our office at [phone] and then follow the prompts to be connected to the call team.  Patient ID: Samuel Mata, male   DOB: 1931-02-07, 89 y.o.   MRN: [id number]

## 2023-11-02 NOTE — PMR Pre-admission (Signed)
 PMR Admission Coordinator Pre-Admission Assessment  Patient: Samuel Mata is an 89 y.o., male MRN: [id number] DOB: [birth year] Height: 5' 7 (170.2 cm) Weight: 64.4 kg            Insurance Information HMO: ***    PPO: ***     PCP:      IPA:      80/20:      OTHER:  PRIMARY: [employer] Medicare HMO/PPO      Policy#: [id number]      Subscriber: pt CM Name: ***      Phone#: ***     Fax#: *** Pre-Cert#: ***      Employer: *** Benefits:  Phone #: ***     Name: *** Samuel Mata. Date: ***     Deduct: ***      Out of Pocket Max: ***      Life Max: ***  CIR: ***      SNF: *** Outpatient: ***     Co-Pay: *** Home Health: ***      Co-Pay: *** DME: ***     Co-Pay: *** Providers: in network  SECONDARY: none      Policy#:       Phone#:   Artist:       Phone#:   The Data processing manager" for patients in Inpatient Rehabilitation Facilities with attached "Privacy Act Statement-Health Care Records" was provided and verbally reviewed with: Patient and Family  Emergency Contact Information Contact Information     Name Relation Home Work Mobile   Samuel Mata Daughter   [phone]   [facility] Spouse [phone]  [phone]   Samuel Mata Daughter   [phone]      Other Contacts   None on File    Current Medical History  Patient Admitting Diagnosis: left intertrochanteric hip fracture  History of Present Illness: 89 yo male with past medical history of bronchiectasis, essential hypertension, 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 Samuel Mata He presented on 10/31/23 when he lost his balance in the kitchen and fell on his left side.  Orthopedics was consulted. Xray showed left hip fracture. Underwent IM nailing left hip on 11/01/23. WBAT. Postop pain management to try to minimize narcotics. Okay to resume Plavix . To refer to outpatient osteoporosis clinic. Felt to be fragility  fracture. Vitamin D levels pending.  Recently switched form amlodipine  to Benicar for essential hypertension. Bronchodilators as needed for COPD. Continue Crestor . Scheduled colace and as needed Miralax .  Patient's medical record from [facility] has been reviewed by the rehabilitation admission coordinator and physician.  Past Medical History  Past Medical History:  Diagnosis Date   Bronchiectasis without complication (HCC)    Enlarged prostate    Hearing loss    History of kidney stones    Hypertension    Pulmonary nocardiosis (HCC) 2021   treated with 6 month antibiotic course   Stroke (HCC) 2022   left frontoparietal   TIA (transient ischemic attack) 2019   Has the patient had major surgery during 100 days prior to admission? Yes  Family History  family history is not on file.  Current Medications   Current Facility-Administered Medications:    acetaminophen  (TYLENOL ) tablet 500 mg, 500 mg, Oral, Q8H, Samuel Sicks, PA-C, 500 mg at 11/02/23 1337   bisacodyl  (DULCOLAX) EC tablet 5 mg, 5 mg, Oral, Daily PRN, Samuel Sicks, PA-C   clopidogrel  (PLAVIX ) tablet 75 mg, 75 mg, Oral, Daily, Samuel Sicks, PA-C, 75 mg at  11/02/23 0932   docusate sodium  (COLACE) capsule 100 mg, 100 mg, Oral, BID, Samuel Sicks, PA-C, 100 mg at 11/02/23 0932   hydrALAZINE (APRESOLINE) injection 5 mg, 5 mg, Intravenous, Q4H PRN, Samuel Sicks, PA-C   HYDROcodone-acetaminophen  (NORCO/VICODIN) 5-325 MG per tablet 1-2 tablet, 1-2 tablet, Oral, Q6H PRN, Samuel Sicks, PA-C, 1 tablet at 11/02/23 0600   menthol-cetylpyridinium (CEPACOL) lozenge 3 mg, 1 lozenge, Oral, PRN **OR** phenol (CHLORASEPTIC) mouth spray 1 spray, 1 spray, Mouth/Throat, PRN, Samuel Sicks, PA-C   methocarbamol (ROBAXIN) tablet 500 mg, 500 mg, Oral, Q6H PRN, 500 mg at 10/31/23 2202 **OR** methocarbamol (ROBAXIN) injection 500 mg, 500 mg, Intravenous, Q6H PRN, Samuel Sicks, PA-C   metoCLOPramide (REGLAN) tablet 5-10 mg, 5-10 mg, Oral, Q8H PRN  **OR** metoCLOPramide (REGLAN) injection 5-10 mg, 5-10 mg, Intravenous, Q8H PRN, Samuel Sicks, PA-C   ondansetron (ZOFRAN) tablet 4 mg, 4 mg, Oral, Q6H PRN **OR** ondansetron (ZOFRAN) injection 4 mg, 4 mg, Intravenous, Q6H PRN, Samuel Sicks, PA-C   polyethylene glycol (MIRALAX  / GLYCOLAX ) packet 17 g, 17 g, Oral, Daily PRN, Samuel Sicks, PA-C  Patients Current Diet:  Diet Order             Diet regular Room service appropriate? Yes; Fluid consistency: Thin  Diet effective now                  Precautions / Restrictions Precautions Precautions: Fall Restrictions Weight Bearing Restrictions Per Provider Order: Yes LLE Weight Bearing Per Provider Order: Weight bearing as tolerated   Has the patient had 2 or more falls or a fall with injury in the past year?Yes  Prior Activity Level Community (5-7x/wk): Independent without AD, until a month ago had weekly hired aide to assist with showering, now wife supervisies his showering  Prior Functional Level Prior Function Prior Level of Function : Independent/Modified Independent Mobility Comments: ind without device since CVA about 8 months ago but did have some balance impairment per daughter ADLs Comments: aide was helping pt with shower 1 day a week;  wife completes iADls  Self Care: Did the patient need help bathing, dressing, using the toilet or eating?  Needed some help  Indoor Mobility: Did the patient need assistance with walking from room to room (with or without device)? Independent  Stairs: Did the patient need assistance with internal or external stairs (with or without device)? Independent  Functional Cognition: Did the patient need help planning regular tasks such as shopping or remembering to take medications? Independent  Patient Information Are you of Hispanic, Latino/a,or Spanish origin?: A. No, not of Hispanic, Latino/a, or Spanish origin What is your race?: A. White Do you need or want an interpreter to communicate  with a doctor or health care staff?: 0. No  Patient's Response To:  Health Literacy and Transportation Is the patient able to respond to health literacy and transportation needs?: Yes Health Literacy - How often do you need to have someone help you when you read instructions, pamphlets, or other written material from your doctor or pharmacy?: Never In the past 12 months, has lack of transportation kept you from medical appointments or from getting medications?: No In the past 12 months, has lack of transportation kept you from meetings, work, or from getting things needed for daily living?: No  Home Assistive Devices / Equipment Home Equipment: Spray - single point, Grab bars - tub/shower, Tub bench, Agricultural consultant (2 wheels), Toilet riser  Prior Device Use: Indicate devices/aids used by the patient prior to  current illness, exacerbation or injury? None of the above  Current Functional Level Cognition  Orientation Level: Oriented X4    Extremity Assessment (includes Sensation/Coordination)  Upper Extremity Assessment: Generalized weakness  Lower Extremity Assessment: Defer to PT evaluation LLE Deficits / Details: grossly 3-/5    ADLs  Overall ADL's : Needs assistance/impaired Eating/Feeding: Set up, Sitting Grooming: Minimal assistance, Sitting Upper Body Bathing: Moderate assistance, Sitting, Bed level Lower Body Bathing: Maximal assistance, Sitting/lateral leans, Bed level Upper Body Dressing : Moderate assistance, Sitting, Bed level Lower Body Dressing: Maximal assistance, Sitting/lateral leans, Bed level Toilet Transfer: Moderate assistance, +2 for physical assistance Toileting- Clothing Manipulation and Hygiene: Maximal assistance Functional mobility during ADLs: Moderate assistance, +2 for physical assistance    Mobility  Overal bed mobility: Needs Assistance Bed Mobility: Supine to Sit Supine to sit: Mod assist, HOB elevated, Used rails General bed mobility comments: up  with PT upon arrival    Transfers  Overall transfer level: Needs assistance Equipment used: Rolling walker (2 wheels) Transfers: Sit to/from Stand Sit to Stand: Mod assist, +2 physical assistance, From elevated surface General transfer comment: Pt required mod assist of 2 to rise and steady as he has difficulty with weight bearing on left LE.  Can stand statically with CGA with RW.    Ambulation / Gait / Stairs / Wheelchair Mobility  Ambulation/Gait Ambulation/Gait assistance: Mod assist, +2 physical assistance Gait Distance (Feet): 8 Feet Assistive device: Rolling walker (2 wheels) Gait Pattern/deviations: Step-to pattern, Decreased step length - left, Decreased stance time - left, Decreased weight shift to left, Knee flexed in stance - left, Shuffle, Antalgic, Knees buckling, Trunk flexed General Gait Details: Pt needed cues to sequence steps and RW. Pt having difficulty weight bearing on left LE due to pain and daughter states this leg was weaker prior to the fall. Pt needing mod assist of 2 as left knee buckled x 3 with steps due to weakness and pain.  Limited distance due to this.  . Gait velocity interpretation: <1.31 ft/sec, indicative of household ambulator    Posture / Balance Balance Overall balance assessment: Needs assistance Sitting-balance support: No upper extremity supported, Feet supported Sitting balance-Leahy Scale: Good Standing balance support: Bilateral upper extremity supported, During functional activity, Reliant on assistive device for balance Standing balance-Leahy Scale: Poor Standing balance comment: relies on device and CGA assist of 2 statically and mod assist dynamically    Special needs/care consideration Fall precautions. Should use RW at baseline due to balance issues, but patient does not utilize   Previous Youth worker: Spouse/significant other  Lives With: Spouse Available Help at Discharge: Family, Available 24 hours/day  (wife can only provide supervision level) Type of Home: House Home Layout: One level Home Access: Stairs to enter Entrance Stairs-Rails: None Entrance Stairs-Number of Steps: 1 + 1 Bathroom Shower/Tub: Associate Professor: Yes Home Care Services: No Additional Comments: walked around neighborhood every day  Discharge Living Setting Plans for Discharge Living Setting: Patient's home, Lives with (comment) (spouse) Type of Home at Discharge: House Discharge Home Layout: One level Discharge Home Access: Stairs to enter Entrance Stairs-Rails: None Entrance Stairs-Number of Steps: 1 + 1 Discharge Bathroom Shower/Tub: Tub/shower unit Discharge Bathroom Toilet: Standard Discharge Bathroom Accessibility: Yes How Accessible: Accessible via walker Does the patient have any problems obtaining your medications?: No  Social/Family/Support Systems Patient Roles: Spouse, Parent Contact Information: daughter, Aron Lard, he requests that wife not be his contact for it upsets her  Anticipated Caregiver: wife Anticipated Caregiver's Contact Information: Aron Lard, is nok, local daughter Ability/Limitations of Caregiver: wife can provide supervision level only. They can hire aide to provide physical care for 2 to 3 hrs per day, not 24/7 Caregiver Availability: 24/7 Discharge Plan Discussed with Primary Caregiver: Yes Is Caregiver In Agreement with Plan?: Yes Does Caregiver/Family have Issues with Lodging/Transportation while Pt is in Rehab?: No  Goals Patient/Family Goal for Rehab: supervision with PT and OT Expected length of stay: ELOS 10 to 14 days Additional Information: they can hire 2 to 3 hrs per day if physical caregiver supports, not 24/7 Pt/Family Agrees to Admission and willing to participate: Yes Program Orientation Provided & Reviewed with Pt/Caregiver Including Roles  & Responsibilities: Yes  Decrease burden of Care through IP rehab  admission: n/a  Possible need for SNF placement upon discharge:if patient requires 24/7 physical assist, Aron Lard would pursue SNF. I explained on 6/18 that [facility] would unlikely cover both CIR admit as well as SNF  Patient Condition: This patient's condition remains as documented in the consult dated ***, in which the Rehabilitation Physician determined and documented that the patient's condition is appropriate for intensive rehabilitative care in an inpatient rehabilitation facility. Will admit to inpatient rehab today.  Preadmission Screen Completed By:  Tanya Fantasia, RN, 11/02/2023 3:38 PM ______________________________________________________________________   Discussed status with Dr. Aaron Aason***at *** and received approval for admission today.  Admission Coordinator:  Tanya Fantasia, time***/Date***

## 2023-11-02 NOTE — Evaluation (Signed)
 Clinical/Bedside Swallow Evaluation Patient Details  Name: Samuel Mata MRN: [id number] Date of Birth: [birth year]  Today's Date: 11/02/2023 Time: SLP Start Time (ACUTE ONLY): 1016 SLP Stop Time (ACUTE ONLY): 1030 SLP Time Calculation (min) (ACUTE ONLY): 14 min  Past Medical History:  Past Medical History:  Diagnosis Date   Bronchiectasis without complication (HCC)    Enlarged prostate    Hearing loss    History of kidney stones    Hypertension    Pulmonary nocardiosis (HCC) 2021   treated with 6 month antibiotic course   Stroke (HCC) 2022   left frontoparietal   TIA (transient ischemic attack) 2019   Past Surgical History:  Past Surgical History:  Procedure Laterality Date   APPENDECTOMY     CHOLECYSTECTOMY     INTRAMEDULLARY (IM) NAIL INTERTROCHANTERIC Left 11/01/2023   Procedure: FIXATION, FRACTURE, INTERTROCHANTERIC, WITH INTRAMEDULLARY ROD;  Surgeon: Hardy Lia, MD;  Location: [facility] OR;  Service: Orthopedics;  Laterality: Left;   LOOP RECORDER INSERTION N/A 01/15/2021   Procedure: LOOP RECORDER INSERTION;  Surgeon: Jolly Needle, MD;  Location: [facility] INVASIVE CV LAB;  Service: Cardiovascular;  Laterality: N/A;   NOSE SURGERY     SPINE SURGERY     compressoin fx T12, kyphoplasty   SPLENECTOMY, TOTAL     HPI:  Samuel Mata is a 89 y.o. male with past medical history  of bronchiectasis essential hypertension, history of stroke, nephrolithiasis, history of nocardiosis pulmonary presenting with fall when he lost his balance in the kitchen and he fell on his left side.  Patient denies loss of consciousness or striking his head.  CT head revealed no intracranial abnormalities.    Assessment / Plan / Recommendation  Clinical Impression  Patient presents with clinical swallowing function that is overall WFL with no overt s/sx of penetration/aspiration and no difficulty reported across all administered items. Patient and daughter report no baseline swallowing difficulty, no hx of  pna, etc. Recommend continuation of regular/thin liquid diet. Administer medications whole with thin liquid. No further SLP needs indicated. SLP Visit Diagnosis: Dysphagia, unspecified (R13.10)    Aspiration Risk  No limitations    Diet Recommendation Regular;Thin liquid    Liquid Administration via: Straw;Cup Medication Administration: Whole meds with liquid Supervision: Patient able to self feed Postural Changes: Seated upright at 90 degrees    Other  Recommendations Oral Care Recommendations: Oral care BID     Assistance Recommended at Discharge    Functional Status Assessment Patient has had a recent decline in their functional status and demonstrates the ability to make significant improvements in function in a reasonable and predictable amount of time.  Frequency and Duration            Prognosis        Swallow Study   General Date of Onset: 10/31/23 HPI: Samuel Mata is a 89 y.o. male with past medical history  of bronchiectasis essential hypertension, history of stroke, nephrolithiasis, history of nocardiosis pulmonary presenting with fall when he lost his balance in the kitchen and he fell on his left side.  Patient denies loss of consciousness or striking his head.  CT head revealed no intracranial abnormalities. Type of Study: Bedside Swallow Evaluation Previous Swallow Assessment: n/a Diet Prior to this Study: Regular;Thin liquids (Level 0) Temperature Spikes Noted: No Respiratory Status: Room air History of Recent Intubation: Yes Total duration of intubation (days):  (for procedure only) Date extubated: 11/01/23 Behavior/Cognition: Alert;Cooperative;Pleasant mood Oral Cavity Assessment: Within Functional Limits Oral Care Completed by SLP: No  Oral Cavity - Dentition: Adequate natural dentition Vision: Functional for self-feeding Self-Feeding Abilities: Able to feed self Patient Positioning: Upright in bed Baseline Vocal Quality: Samuel;Hoarse Volitional  Cough: Strong Volitional Swallow: Able to elicit    Oral/Motor/Sensory Function Overall Oral Motor/Sensory Function: Within functional limits   Ice Chips Ice chips: Not tested   Thin Liquid Thin Liquid: Within functional limits Presentation: Self Fed;Straw    Nectar Thick Nectar Thick Liquid: Not tested   Honey Thick Honey Thick Liquid: Not tested   Puree Puree: Not tested   Solid     Solid: Within functional limits Presentation: Self Fed     Samuel Mata, M.A., CCC-SLP  Samuel Mata 11/02/2023,12:51 PM

## 2023-11-02 NOTE — Care Management Important Message (Signed)
 Important Message  Patient Details  Name: Samuel Mata MRN: [id number] Date of Birth: July 25, 1930   Important Message Given:  Yes - Medicare IM     Wynonia Hedges 11/02/2023, 1:19 PM

## 2023-11-03 DIAGNOSIS — R410 Disorientation, unspecified: Secondary | ICD-10-CM

## 2023-11-03 DIAGNOSIS — S72142D Displaced intertrochanteric fracture of left femur, subsequent encounter for closed fracture with routine healing: Secondary | ICD-10-CM

## 2023-11-03 LAB — CBC WITH DIFFERENTIAL/PLATELET
Abs Immature Granulocytes: 0.05 10*3/uL (ref 0.00–0.07)
Basophils Absolute: 0 10*3/uL (ref 0.0–0.1)
Basophils Relative: 0 %
Eosinophils Absolute: 0.1 10*3/uL (ref 0.0–0.5)
Eosinophils Relative: 1 %
HCT: 40.5 % (ref 39.0–52.0)
Hemoglobin: 13.4 g/dL (ref 13.0–17.0)
Immature Granulocytes: 0 %
Lymphocytes Relative: 5 %
Lymphs Abs: 0.7 10*3/uL (ref 0.7–4.0)
MCH: 31.1 pg (ref 26.0–34.0)
MCHC: 33.1 g/dL (ref 30.0–36.0)
MCV: 94 fL (ref 80.0–100.0)
Monocytes Absolute: 1.8 10*3/uL — ABNORMAL HIGH (ref 0.1–1.0)
Monocytes Relative: 14 %
Neutro Abs: 10.3 10*3/uL — ABNORMAL HIGH (ref 1.7–7.7)
Neutrophils Relative %: 80 %
Platelets: 271 10*3/uL (ref 150–400)
RBC: 4.31 MIL/uL (ref 4.22–5.81)
RDW: 12.5 % (ref 11.5–15.5)
WBC: 13 10*3/uL — ABNORMAL HIGH (ref 4.0–10.5)
nRBC: 0 % (ref 0.0–0.2)

## 2023-11-03 LAB — BASIC METABOLIC PANEL WITH GFR
Anion gap: 8 (ref 5–15)
BUN: 18 mg/dL (ref 8–23)
CO2: 25 mmol/L (ref 22–32)
Calcium: 8.7 mg/dL — ABNORMAL LOW (ref 8.9–10.3)
Chloride: 104 mmol/L (ref 98–111)
Creatinine, Ser: 0.99 mg/dL (ref 0.61–1.24)
GFR, Estimated: >60 mL/min (ref >60)
Glucose, Bld: 103 mg/dL — ABNORMAL HIGH (ref 70–99)
Potassium: 3.4 mmol/L — ABNORMAL LOW (ref 3.5–5.1)
Sodium: 137 mmol/L (ref 135–145)

## 2023-11-03 MED ORDER — LORAZEPAM 2 MG/ML IJ SOLN
1.0000 mg | Freq: Once | INTRAMUSCULAR | Status: AC
  Filled 2023-11-03: qty 1

## 2023-11-03 MED ORDER — KETOROLAC TROMETHAMINE 15 MG/ML IJ SOLN
15.0000 mg | Freq: Four times a day (QID) | INTRAMUSCULAR | Status: DC | PRN
  Filled 2023-11-03: qty 1

## 2023-11-03 MED ORDER — QUETIAPINE FUMARATE 25 MG PO TABS: 25.0000 mg | ORAL_TABLET | Freq: Every day | ORAL | Status: DC

## 2023-11-03 MED ORDER — QUETIAPINE FUMARATE 25 MG PO TABS: 25.0000 mg | ORAL_TABLET | Freq: Once | ORAL | Status: DC

## 2023-11-03 MED ORDER — ENSURE PLUS HIGH PROTEIN PO LIQD
237.0000 mL | Freq: Two times a day (BID) | ORAL | Status: DC
  Administered 2023-11-04 (×2): 237 mL via ORAL

## 2023-11-03 NOTE — Plan of Care (Signed)
 Patient confused this evening, pulling off telemetry, removing his IVS and purewick and attempting to get out of bed unassisted. MD notified

## 2023-11-03 NOTE — Progress Notes (Signed)
 Patient continues to pull off telemetry after multiple attempts to replace it due to paitent is confused at this time and has to be redirected and educated on telemetry placement.  Patient reoriented to where he is and why ongoing.

## 2023-11-03 NOTE — Progress Notes (Signed)
 MD notified that patient has pulled out both Ivs and will not let nurse attempt to replace them. No new orders received at this time.

## 2023-11-03 NOTE — Consult Note (Signed)
 Physical Medicine and Rehabilitation Consult Reason for Consult: Impaired functional mobility after left hip fracture Referring Physician: Jamse Mcgee   HPI: Samuel Mata is a 89 y.o. male with a history of hypertension, prior stroke, pulmonary nocardiosis, BPH who fell in his kitchen on October 31, 2023 when he lost his balance.  He experienced immediate left hip pain and was brought to the emergency room.  Imaging of the pelvis demonstrated a three-part left intertrochanteric hip fracture.  CT of the cervical spine did not show any acute abnormalities.  CTA and CT of the head did not demonstrate any acute intracranial or large vessel occlusion.  Chronic cerebellar and right thalamic infarcts were seen however.  On 11/01/2023 patient underwent intramedullary nailing of the left hip by Dr. Guyann Leitz.  Patient has been confused postoperatively especially last evening, pulling on lines and catheters.  Mild hypokalemia noted as well as leukocytosis of 13,000 today.  He did have a low-grade temperature early this morning of 99.2.  Patient is weightbearing as tolerated left lower extremity.  He was up with therapies yesterday and was mod assist for sit to stand transfers and mod assist 8 feet using a rolling walker.  Therapy notes shuffling, antalgic gait.  Patient was moderate to maximal assistance for basic ADLs.  Prior to arrival patient was independent without a device for gait but was having some intermittent balance issues apparently and was advised to use a walker.  He lives with his spouse in a 1 level house with 1+1 steps to enter.  Patient walked around his neighborhood every day by report.    Home: Home Living Family/patient expects to be discharged to:: Private residence Living Arrangements: Spouse/significant other Available Help at Discharge: Family, Available 24 hours/day (wife can only provide supervision level) Type of Home: House Home Access: Stairs to enter [employer] of  Steps: 1 + 1 Entrance Stairs-Rails: None Home Layout: One level Bathroom Shower/Tub: Engineer, manufacturing systems: Standard Bathroom Accessibility: Yes Home Equipment: Cane - single point, Grab bars - tub/shower, Tub bench, Rolling Walker (2 wheels), Toilet riser Additional Comments: walked around neighborhood every day  Lives With: Spouse  Functional History: Prior Function Prior Level of Function : Independent/Modified Independent Mobility Comments: ind without device since CVA about 8 months ago but did have some balance impairment per daughter ADLs Comments: aide was helping pt with shower 1 day a week;  wife completes iADls Functional Status:  Mobility: Bed Mobility Overal bed mobility: Needs Assistance Bed Mobility: Supine to Sit Supine to sit: Mod assist, HOB elevated, Used rails General bed mobility comments: up with PT upon arrival Transfers Overall transfer level: Needs assistance Equipment used: Rolling walker (2 wheels) Transfers: Sit to/from Stand Sit to Stand: Mod assist, +2 physical assistance, From elevated surface General transfer comment: Pt required mod assist of 2 to rise and steady as he has difficulty with weight bearing on left LE.  Can stand statically with CGA with RW. Ambulation/Gait Ambulation/Gait assistance: Mod assist, +2 physical assistance Gait Distance (Feet): 8 Feet Assistive device: Rolling walker (2 wheels) Gait Pattern/deviations: Step-to pattern, Decreased step length - left, Decreased stance time - left, Decreased weight shift to left, Knee flexed in stance - left, Shuffle, Antalgic, Knees buckling, Trunk flexed General Gait Details: Pt needed cues to sequence steps and RW. Pt having difficulty weight bearing on left LE due to pain and daughter states this leg was weaker prior to the fall. Pt needing mod assist of 2 as  left knee buckled x 3 with steps due to weakness and pain.  Limited distance due to this.  . Gait velocity  interpretation: <1.31 ft/sec, indicative of household ambulator    ADL: ADL Overall ADL's : Needs assistance/impaired Eating/Feeding: Set up, Sitting Grooming: Minimal assistance, Sitting Upper Body Bathing: Moderate assistance, Sitting, Bed level Lower Body Bathing: Maximal assistance, Sitting/lateral leans, Bed level Upper Body Dressing : Moderate assistance, Sitting, Bed level Lower Body Dressing: Maximal assistance, Sitting/lateral leans, Bed level Toilet Transfer: Moderate assistance, +2 for physical assistance Toileting- Clothing Manipulation and Hygiene: Maximal assistance Functional mobility during ADLs: Moderate assistance, +2 for physical assistance  Cognition: Cognition Orientation Level: Oriented X4 Cognition Arousal: Alert Behavior During Therapy: Flat affect   Review of Systems  Unable to perform ROS: Mental acuity   Past Medical History:  Diagnosis Date   Bronchiectasis without complication (HCC)    Enlarged prostate    Hearing loss    History of kidney stones    Hypertension    Pulmonary nocardiosis (HCC) 2021   treated with 6 month antibiotic course   Stroke (HCC) 2022   left frontoparietal   TIA (transient ischemic attack) 2019   Past Surgical History:  Procedure Laterality Date   APPENDECTOMY     CHOLECYSTECTOMY     INTRAMEDULLARY (IM) NAIL INTERTROCHANTERIC Left 11/01/2023   Procedure: FIXATION, FRACTURE, INTERTROCHANTERIC, WITH INTRAMEDULLARY ROD;  Surgeon: Hardy Lia, MD;  Location: [facility] OR;  Service: Orthopedics;  Laterality: Left;   LOOP RECORDER INSERTION N/A 01/15/2021   Procedure: LOOP RECORDER INSERTION;  Surgeon: Jolly Needle, MD;  Location: [facility] IN[facility]ASIVE [facility];  Service: Cardiovascular;  Laterality: N/A;   NOSE SURGERY     SPINE SURGERY     compressoin fx T12, kyphoplasty   SPLENECTOMY, TOTAL     Family History  Problem Relation Age of Onset   Lung disease Neg Hx    Social History:  reports that he quit smoking about 39 years  ago. His smoking use included cigarettes. He started smoking about 69 years ago. He has a 30 pack-year smoking history. He has never used smokeless tobacco. He reports that he does not drink alcohol  and does not use drugs. Allergies:  Allergies  Allergen Reactions   Codeine     Unknown reaction   Medications Prior to Admission  Medication Sig Dispense Refill   clopidogrel  (PLAVIX ) 75 MG tablet Take 1 tablet (75 mg total) by mouth daily. 90 tablet 0   famotidine  (PEPCID ) 20 MG tablet Take 1 tablet (20 mg total) by mouth daily. (Patient taking differently: Take 20 mg by mouth every evening.) 30 tablet 0   fluticasone  (FLONASE ) 50 MCG/ACT nasal spray Place 2 sprays into both nostrils daily as needed for allergies.     Naphazoline HCl (CLEAR EYES OP) Place 1 drop into both eyes daily as needed (for eye redness).     olmesartan (BENICAR) 5 MG tablet Take 5 mg by mouth daily.     polyvinyl alcohol  (LIQUIFILM TEARS) 1.4 % ophthalmic solution Place 2 drops into the right eye 4 (four) times daily. (Patient taking differently: Place 2 drops into the right eye daily as needed for dry eyes.) 15 mL 0   rosuvastatin  (CRESTOR ) 20 MG tablet Take 1 tablet (20 mg total) by mouth daily with supper. 30 tablet 0   sodium chloride  (OCEAN) 0.65 % nasal spray Place 1 spray into the nose as needed for congestion.     tamsulosin  (FLOMAX ) 0.4 MG CAPS capsule Take 1 capsule (  0.4 mg total) by mouth daily. (Patient taking differently: Take 0.4 mg by mouth daily after supper.) 90 capsule 1   acetaminophen  (TYLENOL ) 325 MG tablet Take 650 mg by mouth as needed for headache.     amLODipine  (NORVASC ) 5 MG tablet Take 1 tablet (5 mg total) by mouth daily. (Patient not taking: Reported on 10/31/2023) 30 tablet 0   nitroGLYCERIN  (NITROSTAT ) 0.4 MG SL tablet Place 1 tablet (0.4 mg total) under the tongue every 5 (five) minutes as needed for chest pain. 25 tablet 4     Blood pressure 112/83, pulse 96, temperature 98 F (36.7 C),  temperature source Oral, resp. rate 18, height 5' 7 (1.702 m), weight 64.4 kg, SpO2 90%. Physical Exam Constitutional:      Comments: Asleep, slow to awaken  HENT:     Head: Normocephalic and atraumatic.     Nose: Nose normal.     Mouth/Throat:     Comments: Oral membranes dry  Eyes:     Conjunctiva/sclera: Conjunctivae normal.     Pupils: Pupils are equal, round, and reactive to light.    Cardiovascular:     Rate and Rhythm: Normal rate.  Pulmonary:     Effort: Pulmonary effort is normal.  Abdominal:     Palpations: Abdomen is soft.   Musculoskeletal:        General: Swelling (left hip) and tenderness (Left hip) present.     Cervical back: Normal range of motion.   Skin:    General: Skin is warm.     Comments: Foam dressing over left hip incision   Neurological:     Comments: Pt awakened to verbal cues. Oriented to person. Speech very dysarthric, still lethargic, difficult to discern whether he was aware of place, date, etc. Did make eye contact. Attempted to follow basic commands. Poor oro-motor control. No focal CN abnl. MMT: Moves both UE against gravity. Wiggled toes of left foot. Moved RLE 2-4/5 prox to distal. Sensed pain in all 4's.   Psychiatric:     Comments: Flat but pleasant when awake enough     Results for orders placed or performed during the hospital encounter of 10/31/23 (from the past 24 hours)  CBC with Differential/Platelet     Status: Abnormal   Collection Time: 11/03/23  3:24 AM  Result Value Ref Range   WBC 13.0 (H) 4.0 - 10.5 K/uL   RBC 4.31 4.22 - 5.81 MIL/uL   Hemoglobin 13.4 13.0 - 17.0 g/dL   HCT 78.2 95.6 - 21.3 %   MCV 94.0 80.0 - 100.0 fL   MCH 31.1 26.0 - 34.0 pg   MCHC 33.1 30.0 - 36.0 g/dL   RDW 08.6 57.8 - 46.9 %   Platelets 271 150 - 400 K/uL   nRBC 0.0 0.0 - 0.2 %   Neutrophils Relative % 80 %   Neutro Abs 10.3 (H) 1.7 - 7.7 K/uL   Lymphocytes Relative 5 %   Lymphs Abs 0.7 0.7 - 4.0 K/uL   Monocytes Relative 14 %    Monocytes Absolute 1.8 (H) 0.1 - 1.0 K/uL   Eosinophils Relative 1 %   Eosinophils Absolute 0.1 0.0 - 0.5 K/uL   Basophils Relative 0 %   Basophils Absolute 0.0 0.0 - 0.1 K/uL   Immature Granulocytes 0 %   Abs Immature Granulocytes 0.05 0.00 - 0.07 K/uL  Basic metabolic panel with GFR     Status: Abnormal   Collection Time: 11/03/23  3:24 AM  Result Value Ref  Range   Sodium 137 135 - 145 mmol/L   Potassium 3.4 (L) 3.5 - 5.1 mmol/L   Chloride 104 98 - 111 mmol/L   CO2 25 22 - 32 mmol/L   Glucose, Bld 103 (H) 70 - 99 mg/dL   BUN 18 8 - 23 mg/dL   Creatinine, Ser 7.82 0.61 - 1.24 mg/dL   Calcium  8.7 (L) 8.9 - 10.3 mg/dL   GFR, Estimated >95 >62 mL/min   Anion gap 8 5 - 15   DG FEMUR PORT MIN 2 VIEWS LEFT Result Date: 11/01/2023 CLINICAL DATA:  Fracture, postop. EXAM: LEFT FEMUR PORTABLE 2 VIEWS COMPARISON:  Operative imaging FINDINGS: Femoral intramedullary nail with trans trochanteric and distal locking screw fixation traversing proximal femur fracture. Improved fracture alignment from preoperative imaging. Recent postsurgical change includes air and edema in the soft tissues. IMPRESSION: ORIF of proximal femur fracture without immediate postoperative complication. Electronically Signed   By: Chadwick Colonel M.D.   On: 11/01/2023 18:03   DG FEMUR MIN 2 VIEWS LEFT Result Date: 11/01/2023 CLINICAL DATA:  [id number] Surgery, elective [id number] EXAM: LEFT FEMUR 2 VIEWS COMPARISON:  Preoperative radiograph FINDINGS: Eight fluoroscopic spot views of the left femur submitted from the operating room. Femoral intramedullary nail with trans trochanteric and distal locking screw fixation traverse proximal femur fracture. Fluoroscopy time 1 minutes 14 seconds. Dose 14.72 mGy. IMPRESSION: Intraoperative fluoroscopy during proximal femur fracture fixation. Electronically Signed   By: Chadwick Colonel M.D.   On: 11/01/2023 18:02   DG C-Arm 1-60 Min-No Report Result Date: 11/01/2023 Fluoroscopy was utilized by  the requesting physician.  No radiographic interpretation.   DG C-Arm 1-60 Min-No Report Result Date: 11/01/2023 Fluoroscopy was utilized by the requesting physician.  No radiographic interpretation.    Assessment/Plan: Diagnosis: 89 year old male with a history of prior CVA status post left three-part intertrochanteric hip fracture with subsequent IM nail. Does the need for close, 24 hr/day medical supervision in concert with the patient's rehab needs make it unreasonable for this patient to be served in a less intensive setting? Yes Co-Morbidities requiring supervision/potential complications:  - Postoperative confusion/delirium -Elevated white blood cell count and low-grade temperature -Hypokalemia -Postoperative pain control Due to bladder management, bowel management, safety, skin/wound care, disease management, medication administration, pain management, and patient education, does the patient require 24 hr/day rehab nursing? Yes Does the patient require coordinated care of a physician, rehab nurse, therapy disciplines of PT and OT to address physical and functional deficits in the context of the above medical diagnosis(es)? Yes Addressing deficits in the following areas: balance, endurance, locomotion, strength, transferring, bowel/bladder control, bathing, dressing, feeding, grooming, toileting, and psychosocial support Can the patient actively participate in an intensive therapy program of at least 3 hrs of therapy per day at least 5 days per week? Yes The potential for patient to make measurable gains while on inpatient rehab is excellent Anticipated functional outcomes upon discharge from inpatient rehab are supervision  with PT, supervision with OT, n/a with SLP. Estimated rehab length of stay to reach the above functional goals is: 7-10 days Anticipated discharge destination: Home Overall Rehab/Functional Prognosis: excellent  POST ACUTE RECOMMENDATIONS: This patient's condition  is appropriate for continued rehabilitative care in the following setting: CIR Patient has agreed to participate in recommended program. N/A Note that insurance prior authorization may be required for reimbursement for recommended care.  Comment: Pt still slow to awaken and confused during my visit. Didn't sleep much last night. Low grade temp/leukocytosis as well. Rehab Admissions  Coordinator to follow up.     MEDICAL RECOMMENDATIONS: Consider checking UA, UCx Use seroquel  earlier in the evening for sundowning to prevent AM lethargy.  Minimize narcotics, muscle relaxants,  and other neuro-sedating medications as possible   I have personally performed a face to face diagnostic evaluation of this patient. Additionally, I have examined the patient's medical record including any pertinent labs and radiographic images.    Thanks,  Rawland Caddy, MD 11/03/2023

## 2023-11-03 NOTE — Plan of Care (Signed)
 This RN went to check on patient. He stated I will give you $100 to get me out of here. Explained to patient that I could not do that.  Patient reported he had removed his bed pan and that all he did was pass gass and he was full of hot air.  He then stated If I had a bottle of castor oil I bet I would be able to poop.  Educated patient on his medications and that castor oil was ordered for him.  Patient did report he had pain of 5/10 but refused pain meds ordered stating I dont trust this place.  Again reeducated patient on medication safety and that he had pain meds ordered if he needed them and he refused at this time.  Will continue ongoing assessment and treat patient per his needs/wants.  Patient left in bed with side rails up x2 and call light in reach. Knows to use call light for any needs/concerns.

## 2023-11-03 NOTE — Progress Notes (Signed)
 Physical Therapy Treatment Patient Details Name: Samuel Mata MRN: [id number] DOB: [birth year] Today's Date: 11/03/2023   History of Present Illness Pt is a 89 y/o M presenting to ED On 6/16 after fall, found to have L hip fx s/p IMN on 6/17. WBAT. PMH includes bronchiectasis, essential HTN, stroke, nephrolithaisis, nocardiosis pulmonary    PT Comments  Pt received in supine, obtunded and A&O x1, limited session due to pt lethargy after staying up all night per chart review. PTA assisted pt to reposition in bed chair posture and to float heels to attempt to improve alertness, blinds opened more fully. Pt unable to participate in BLE ROM, needing PROM despite max multimodal cues. RN notified PTA needed to increased SpO2 on wall to 2L/min to improve to 92% and greater per MD order. Patient will benefit from intensive inpatient follow-up therapy, >3 hours/day once more alert.    If plan is discharge home, recommend the following: A little help with walking and/or transfers;A little help with bathing/dressing/bathroom;Assistance with cooking/housework;Assist for transportation;Help with stairs or ramp for entrance;Supervision due to cognitive status;Direct supervision/assist for medications management;Direct supervision/assist for financial management   Can travel by private vehicle        Equipment Recommendations  None recommended by PT    Recommendations for Other Services Rehab consult     Precautions / Restrictions Precautions Precautions: Fall;Other (comment) Recall of Precautions/Restrictions: Impaired Precaution/Restrictions Comments: pt appears sedated after previous PM agitation Restrictions Weight Bearing Restrictions Per Provider Order: Yes LLE Weight Bearing Per Provider Order: Weight bearing as tolerated     Mobility  Bed Mobility Overal bed mobility: Needs Assistance Bed Mobility: Supine to Sit     Supine to sit: Total assist, HOB elevated, Used rails     General  bed mobility comments: Pt able to maintain BUE on side railings with hand over hand assist to reach for them, but poor effort and carryover of cues for long sitting in bed.    Transfers Overall transfer level: Needs assistance                 General transfer comment: Not able to assess today, pt too lethargic to follow instructions at bed level and appears sedated.    Ambulation/Gait                   Stairs             Wheelchair Mobility     Tilt Bed    Modified Rankin (Stroke Patients Only)       Balance Overall balance assessment: Needs assistance Sitting-balance support: Bilateral upper extremity supported Sitting balance-Leahy Scale: Zero Sitting balance - Comments: UTA; too lethargic to maintain long sitting wtihout max/totalA       Standing balance comment: UTA; too lethargic                            Communication Communication Communication: Impaired Factors Affecting Communication: Hearing impaired;Other (comment) (has hearing aids; level of sedation)  Cognition Arousal: Obtunded, Suspect due to medications Behavior During Therapy: Flat affect   PT - Cognitive impairments: Difficult to assess Difficult to assess due to: Level of arousal                     PT - Cognition Comments: Per chart review, pt was up until at least 4-5 AM, now appears very sedated despite multimodal cues and repositioning in bed chair posture to  promote alertness. Minimal command following with multimodal cues today. No significant c/o pain with LLE PROM. Pt opens eyes briefly to command, PTA prompted him whether he spoke with rehab MD prior to session (MD came to his room ~10-15 mins before PTA arrived) but pt states I don't think so. Following commands: Impaired Following commands impaired: Follows one step commands inconsistently    Cueing Cueing Techniques: Verbal cues, Tactile cues, Gestural cues  Exercises Total Joint  Exercises Ankle Circles/Pumps: PROM, Both, 10 reps, Supine Short Arc Quad: PROM, Both, 10 reps, Supine Heel Slides: PROM, Left, 5 reps, Supine    General Comments General comments (skin integrity, edema, etc.): Pt SpO2 90% on 1L O2 [place] when PTA checked with HOB elevated, order for >92% so wall O2 increased to 2L/min and SpO2 improved to 92%, RN notified. Pt has order for continuous pulse ox, will check if bedside sensor can be placed. HR WFL.      Pertinent Vitals/Pain Pain Assessment Pain Assessment: PAINAD Breathing: normal Negative Vocalization: none Facial Expression: smiling or inexpressive Body Language: relaxed Consolability: no need to console PAINAD Score: 0 Pain Intervention(s): Limited activity within patient's tolerance, Monitored during session, Repositioned, Other (comment) (pt pain appears controlled with gentle repositioning in supine, but too lethargic to participate in aggressive therapies today, possibly due to sedation/staying up too late)    Home Living                          Prior Function            PT Goals (current goals can now be found in the care plan section) Acute Rehab PT Goals Patient Stated Goal: to go home after rehab PT Goal Formulation: With patient/family Time For Goal Achievement: 11/16/23 Progress towards PT goals: Not progressing toward goals - comment (appears very sedated today)    Frequency    Min 3X/week      PT Plan      Co-evaluation              AM-PAC PT 6 Clicks Mobility   Outcome Measure  Help needed turning from your back to your side while in a flat bed without using bedrails?: A Lot Help needed moving from lying on your back to sitting on the side of a flat bed without using bedrails?: Total Help needed moving to and from a bed to a chair (including a wheelchair)?: Total Help needed standing up from a chair using your arms (e.g., wheelchair or bedside chair)?: Total Help needed to walk in  hospital room?: Total Help needed climbing 3-5 steps with a railing? : Total 6 Click Score: 7    End of Session Equipment Utilized During Treatment: Oxygen Activity Tolerance: Patient limited by lethargy Patient left: in bed;with call bell/phone within reach;with bed alarm set;Other (comment);with SCD's reapplied (bed in chair posture HOB ~65 deg, heel floated, SCD placed back on,  turned up to 2L O2 to maintain SpO2 92% and greater) Nurse Communication: Mobility status;Need for lift equipment;Precautions;Other (comment) (SpO2; obtunded) PT Visit Diagnosis: Muscle weakness (generalized) (M62.81);Unsteadiness on feet (R26.81);Other abnormalities of gait and mobility (R26.89);Pain Pain - Right/Left: Left Pain - part of body: Hip     Time: 4098-1191 PT Time Calculation (min) (ACUTE ONLY): 10 min  Charges:    $Therapeutic Activity: 8-22 mins PT General Charges $$ ACUTE PT VISIT: 1 Visit  Llana Rile., PTA Acute Rehabilitation Services Secure Chat Preferred 9a-5:30pm Office: [phone]    Mariel Shope [facility] 11/03/2023, 12:12 PM

## 2023-11-03 NOTE — Progress Notes (Signed)
 Patient lying in bed, has pulled out his IV'S and telemetry, purewick and trying to get out of bed. Patient reoriented and he was agreeable to not get out of bed without assist.  Monitoring ongoing for this patient.

## 2023-11-03 NOTE — Progress Notes (Signed)
 Progress Note   Patient: Samuel Mata QMV:[id number] DOB: 1930-12-12 DOA: 10/31/2023     3 DOS: the patient was seen and examined on 11/03/2023   Brief hospital course: Samuel Mata is a 89 y.o. male with PMH significant for HTN, HLD, stroke, h/o hereditary spherocytosis s/p splenectomy, pulmonary nocardiosis, bronchiectasis, nephrolithiasis, BPH, past history of smoking Patient lives at home with his wife.  At baseline has poor balance, supposed to use a rolling walker for ambulation but per family, he usually chooses not to. 6/16, patient was walking in his kitchen when he lost his balance and fell, sustained immediate left hip pain and could not get up.   In the ED, patient was afebrile, hemodynamically stable Blood work mostly unremarkable Urinalysis showed clear yellow urine with positive nitrite, small leukocytes. CTA did not show any evidence of acute intracranial abnormality.  It showed atrophy and chronic small vessel ischemic changes of the white matter. Chronic cerebellar and right thalamic infarcts. CT cervical spine did not show any acute fracture or subluxation Chest x-ray did not show any acute abnormality, stable chronic findings X-ray pelvis showed left hip fracture X-ray left knee showed degenerative changes without any acute fracture.   Orthopedics was consulted Admitted to [facility],s/p hip surgery, now pending placement to CIR/SNF. Active issue is hospital acquired delirium  Assessment and Plan:  Left acute intertrochanteric fracture: INTRAMEDULLARY NAILING OF THE LEFT HIP using a statically locked [employer] Aff[employer]us nail 11 X 400. POD 2  Secondary to fall from loss of balance Imaging as above. Orthopedics on board  Pain regimen: As needed oxycodone , as needed morphine, as needed Robaxin Bowel regimen: Scheduled Colace, as needed MiraLAX  DVT prophylaxis: Resumed plavix   Hospital acquired delirium: On the night of 6/18. He developed agitation and was given IV ativan and  PO seroquel . Avoid sedatives/BZDs. Started on low dose oral seroquel  at bedtime Frequent reorientation Avoid restraints   Chronic gait impairment At baseline, supposed to use walker but prefers not to per family.   PT eval postop   H/o stroke, HLD Continue with plavix  and statin   Essential hypertension PTA meds-recently switched from amlodipine  to Benicar 5 mg daily.    COPD As needed bronchodilators   h/o hereditary spherocytosis  s/p prior splenectomy   GERD Pepcid    BPH Flomax  0.4 mg daily    Mobility: PT OT on board   Goals of care   Code Status: Full Code   Disposition: CIR vs SNF, case management on board.     Subjective: Patient was confused overnight. He removed his IV. Daughter at the bedside and is concerned. Patient is pulling off his nasal cannula. He is unable to communicate effectively.  Physical Exam: Vitals:   11/02/23 1643 11/02/23 2006 11/03/23 0355 11/03/23 0829  BP: 121/60 126/72 (!) 147/70 112/83  Pulse: 73 73 98 96  Resp: 18 18 18 18   Temp: 98.8 F (37.1 C) 99.4 F (37.4 C) 99.2 F (37.3 C) 98 F (36.7 C)  TempSrc: Oral Oral Oral Oral  SpO2: 94% 93% 99% 90%  Weight:      Height:       Constitutional: in mild distress, confused and disoriented Eyes: PERRL, lids and conjunctivae normal ENMT: Mucous membranes are moist. Posterior pharynx clear of any exudate or lesions.Normal dentition.  Neck: normal, supple, no masses, no thyromegaly Respiratory: clear to auscultation bilaterally, no wheezing, no crackles. Normal respiratory effort. No accessory muscle use.  Cardiovascular: Regular rate and rhythm, no murmurs / rubs / gallops. No  extremity edema. 2+ pedal pulses. No carotid bruits.  Abdomen: no tenderness, no masses palpated. No hepatosplenomegaly. Bowel sounds positive.  Musculoskeletal: no clubbing / cyanosis. S/p right hip surgery, dressing in place Skin: no rashes, lesions, ulcers. No induration Neurologic: No focal neuro  deficits Psychiatric: confused and disoriented,  Data Reviewed:  There are no new results to review at this time.  Family Communication: Daughter at bedside  Disposition: Status is: Inpatient Remains inpatient appropriate because: post hip surgery  Planned Discharge Destination: Rehab    Time spent: 42 minutes  Author: Clancy Crimes, MD 11/03/2023 11:07 AM  For on call review [email or web address].

## 2023-11-03 NOTE — Progress Notes (Signed)
 Inpatient Rehabilitation Admissions Coordinator    Noted lethargic today, DR Rachel Budds consulted. I await further therapy progress before pursing Auth with [facility] for CIR admit.  Jeannetta Millman, RN, MSN Rehab Admissions Coordinator [phone] 11/03/2023 1:10 PM

## 2023-11-03 NOTE — Plan of Care (Signed)

## 2023-11-04 ENCOUNTER — Inpatient Hospital Stay (HOSPITAL_COMMUNITY)

## 2023-11-04 LAB — CBC WITH DIFFERENTIAL/PLATELET
Abs Immature Granulocytes: 0.04 10*3/uL (ref 0.00–0.07)
Basophils Absolute: 0 10*3/uL (ref 0.0–0.1)
Basophils Relative: 0 %
Eosinophils Absolute: 0.5 10*3/uL (ref 0.0–0.5)
Eosinophils Relative: 4 %
HCT: 33.9 % — ABNORMAL LOW (ref 39.0–52.0)
Hemoglobin: 11.1 g/dL — ABNORMAL LOW (ref 13.0–17.0)
Immature Granulocytes: 0 %
Lymphocytes Relative: 9 %
Lymphs Abs: 1 10*3/uL (ref 0.7–4.0)
MCH: 31 pg (ref 26.0–34.0)
MCHC: 32.7 g/dL (ref 30.0–36.0)
MCV: 94.7 fL (ref 80.0–100.0)
Monocytes Absolute: 1.8 10*3/uL — ABNORMAL HIGH (ref 0.1–1.0)
Monocytes Relative: 17 %
Neutro Abs: 7.2 10*3/uL (ref 1.7–7.7)
Neutrophils Relative %: 70 %
Platelets: 242 10*3/uL (ref 150–400)
RBC: 3.58 MIL/uL — ABNORMAL LOW (ref 4.22–5.81)
RDW: 12.5 % (ref 11.5–15.5)
WBC: 10.6 10*3/uL — ABNORMAL HIGH (ref 4.0–10.5)
nRBC: 0 % (ref 0.0–0.2)

## 2023-11-04 LAB — BASIC METABOLIC PANEL WITH GFR
Anion gap: 7 (ref 5–15)
BUN: 19 mg/dL (ref 8–23)
CO2: 26 mmol/L (ref 22–32)
Calcium: 8.3 mg/dL — ABNORMAL LOW (ref 8.9–10.3)
Chloride: 104 mmol/L (ref 98–111)
Creatinine, Ser: 0.79 mg/dL (ref 0.61–1.24)
GFR, Estimated: >60 mL/min (ref >60)
Glucose, Bld: 89 mg/dL (ref 70–99)
Potassium: 3.6 mmol/L (ref 3.5–5.1)
Sodium: 137 mmol/L (ref 135–145)

## 2023-11-04 MED ORDER — IPRATROPIUM-ALBUTEROL 0.5-2.5 (3) MG/3ML IN SOLN: 3.0000 mL | Freq: Three times a day (TID) | RESPIRATORY_TRACT | Status: DC

## 2023-11-04 MED ORDER — IPRATROPIUM-ALBUTEROL 0.5-2.5 (3) MG/3ML IN SOLN: 3.0000 mL | Freq: Four times a day (QID) | RESPIRATORY_TRACT | Status: DC | PRN

## 2023-11-04 MED ORDER — VITAMIN D (ERGOCALCIFEROL) 1.25 MG (50000 UNIT) PO CAPS
50000.0000 [IU] | ORAL_CAPSULE | ORAL | Status: DC
Start: 2023-11-04 — End: 2023-11-05
  Filled 2023-11-04: qty 1

## 2023-11-04 MED ORDER — FAMOTIDINE 20 MG PO TABS
20.0000 mg | ORAL_TABLET | Freq: Every day | ORAL | Status: DC
  Administered 2023-11-04 – 2023-11-05 (×2): 20 mg via ORAL
  Filled 2023-11-04 (×2): qty 1

## 2023-11-04 MED ORDER — KETOROLAC TROMETHAMINE 30 MG/ML IJ SOLN
30.0000 mg | Freq: Once | INTRAMUSCULAR | Status: AC
  Administered 2023-11-04: 30 mg via INTRAMUSCULAR
  Filled 2023-11-04: qty 1

## 2023-11-04 MED ORDER — ALUM & MAG HYDROXIDE-SIMETH 200-200-20 MG/5ML PO SUSP
15.0000 mL | ORAL | Status: DC | PRN
Start: 2023-11-04 — End: 2023-11-05

## 2023-11-04 MED ORDER — ADULT MULTIVITAMIN W/MINERALS CH
1.0000 | ORAL_TABLET | Freq: Every day | ORAL | Status: DC
  Administered 2023-11-05: 1 via ORAL
  Filled 2023-11-04 (×2): qty 1

## 2023-11-04 NOTE — Progress Notes (Signed)
 Orthopaedic Trauma Service Progress Note  Patient ID: Samuel Mata MRN: [id number] DOB/AGE: [birth year] 89 y.o.  Subjective:  No acute issues  Ortho issues stable Pain controlled    ROS As above  Today's  total administered Morphine Milligram Equivalents: 0 Yesterday's total administered Morphine Milligram Equivalents: 0  Objective:   VITALS:   Vitals:   11/03/23 1955 11/03/23 1958 11/04/23 0507 11/04/23 0827  BP: (!) 150/72  (!) 150/70 125/67  Pulse: 92 81 72 85  Resp: 18  18   Temp: 98.3 F (36.8 C)  98.2 F (36.8 C) 98.1 F (36.7 C)  TempSrc:    Oral  SpO2: (!) 80% 93% 96% 94%  Weight:      Height:        Estimated body mass index is 22.24 kg/m as calculated from the following:   Height as of this encounter: 5' 7 (1.702 m).   Weight as of this encounter: 64.4 kg.   Intake/Output      06/19 0701 06/20 0700 06/20 0701 06/21 0700   Urine (mL/kg/hr) 800 (0.5)    Total Output 800    Net -800         Urine Occurrence  1 x     LABS  Results for orders placed or performed during the hospital encounter of 10/31/23 (from the past 24 hours)  CBC with Differential/Platelet     Status: Abnormal   Collection Time: 11/04/23  3:25 AM  Result Value Ref Range   WBC 10.6 (H) 4.0 - 10.5 K/uL   RBC 3.58 (L) 4.22 - 5.81 MIL/uL   Hemoglobin 11.1 (L) 13.0 - 17.0 g/dL   HCT 40.9 (L) 81.1 - 91.4 %   MCV 94.7 80.0 - 100.0 fL   MCH 31.0 26.0 - 34.0 pg   MCHC 32.7 30.0 - 36.0 g/dL   RDW 78.2 95.6 - 21.3 %   Platelets 242 150 - 400 K/uL   nRBC 0.0 0.0 - 0.2 %   Neutrophils Relative % 70 %   Neutro Abs 7.2 1.7 - 7.7 K/uL   Lymphocytes Relative 9 %   Lymphs Abs 1.0 0.7 - 4.0 K/uL   Monocytes Relative 17 %   Monocytes Absolute 1.8 (H) 0.1 - 1.0 K/uL   Eosinophils Relative 4 %   Eosinophils Absolute 0.5 0.0 - 0.5 K/uL   Basophils Relative 0 %   Basophils Absolute 0.0 0.0 - 0.1 K/uL   Immature  Granulocytes 0 %   Abs Immature Granulocytes 0.04 0.00 - 0.07 K/uL  Basic metabolic panel with GFR     Status: Abnormal   Collection Time: 11/04/23  3:25 AM  Result Value Ref Range   Sodium 137 135 - 145 mmol/L   Potassium 3.6 3.5 - 5.1 mmol/L   Chloride 104 98 - 111 mmol/L   CO2 26 22 - 32 mmol/L   Glucose, Bld 89 70 - 99 mg/dL   BUN 19 8 - 23 mg/dL   Creatinine, Ser 0.86 0.61 - 1.24 mg/dL   Calcium  8.3 (L) 8.9 - 10.3 mg/dL   GFR, Estimated >57 >84 mL/min   Anion gap 7 5 - 15    Latest Reference Range & Units 11/02/23 03:40  Vitamin D, 25-Hydroxy 30 - 100 ng/mL 8.51 (L)  (L): Data is abnormally low  PHYSICAL EXAM:  Gen: resting comfortably in bed, NAD, looks good  Lungs: unlabored Ext:        Left Lower Extremity Dressings are clean and dry              Extremity is warm             No DCT             Compartments are soft  Motor and sensory functions at baseline                Assessment/Plan: 3 Days Post-Op   Principal Problem:   Fall at home, initial encounter Active Problems:   Malnutrition of moderate degree   Anti-infectives (From admission, onward)    Start     Dose/Rate Route Frequency Ordered Stop   11/01/23 2100  ceFAZolin (ANCEF) IVPB 2g/100 mL premix        2 g 200 mL/hr over 30 Minutes Intravenous Every 6 hours 11/01/23 1743 11/02/23 0406   11/01/23 0700  ceFAZolin (ANCEF) IVPB 2g/100 mL premix        2 g 200 mL/hr over 30 Minutes Intravenous On call to O.R. 11/01/23 9528 11/01/23 1455     .  POD/HD#: 36  89 year old male fall with left intertrochanteric hip fracture   - Fall with fragility fracture to left hip   - Left intertrochanteric hip fracture, fragility fracture Weightbearing Weight-bear as tolerated left leg with assistance               ROM/Activity                         Unrestricted range of motion of left hip and left knee               Wound care                         Daily wound care as needed starting    Ok to  clean wounds with soap and water                Ice as needed for swelling and pain control   - Pain management:             Multimodal, minimize narcotics   - ABL anemia/Hemodynamics             Stable, monitor   - Medical issues              Per primary   - DVT/PE prophylaxis:             Home Plavix              SCDs   - ID:              Perioperative antibiotics completed    - Metabolic Bone Disease:             Fractures of fragility fracture             Referral to outpatient osteoporosis clinic             Vitamin D deficiency    Supplement    - Activity:             As above   - Impediments to fracture healing:             Age             Fragility fractures/osteoporosis  Vitamin d deficiency    - Dispo:             Ortho issues stable             Sutures out in approximately 2 weeks             CIR   Samuel Kotyk, PA-C [phone] (C) 11/04/2023, 1:03 PM  [facility] [address] [place] [address] [phone] Samuel [phone] (F)    After 5pm and on the weekends please log on to Amion, go to orthopaedics and the look under the [facility] Call for the provider(s) on call. You can also call our office at [phone] and then follow the prompts to be connected to the call team.  Patient ID: Samuel Mata, male   DOB: 01/24/1931, 89 y.o.   MRN: [id number]

## 2023-11-04 NOTE — TOC Initial Note (Addendum)
 Transition of Care [facility]) - Initial/Assessment Note    Patient Details  Name: Samuel Mata MRN: [id number] Date of Birth: 1931/04/01  [facility] CM/SW Contact:    Juliane Och, LCSW Phone Number: 11/04/2023, 12:28 PM  Clinical Narrative:                  12:28 PM Per chart review, CIR is to submit insurance authorization request for CIR.  4:15 PM Per CIR admissions, patients insurance authorization was approved and patient can be admitted to [facility] tomorrow. Medical team made aware.  Expected Discharge Plan: IP Rehab Facility Barriers to Discharge: Continued Medical Work up, English as a second language teacher   Patient Goals and CMS Choice Patient states their goals for this hospitalization and ongoing recovery are:: CIR          Expected Discharge Plan and Services     Post Acute Care Choice: IP Rehab Living arrangements for the past 2 months: Single Family Home                                      Prior Living Arrangements/Services Living arrangements for the past 2 months: Single Family Home Lives with:: Spouse Patient language and need for interpreter reviewed:: Yes              Criminal Activity/Legal Involvement Pertinent to Current Situation/Hospitalization: No - Comment as needed  Activities of Daily Living   ADL Screening (condition at time of admission) Independently performs ADLs?: No Does the patient have a NEW difficulty with bathing/dressing/toileting/self-feeding that is expected to last >3 days?: No Does the patient have a NEW difficulty with getting in/out of bed, walking, or climbing stairs that is expected to last >3 days?: Yes (Initiates electronic notice to provider for possible PT consult) Does the patient have a NEW difficulty with communication that is expected to last >3 days?: No Is the patient deaf or have difficulty hearing?: Yes Does the patient have difficulty seeing, even when wearing glasses/contacts?: No Does the  patient have difficulty concentrating, remembering, or making decisions?: No  Permission Sought/Granted Permission sought to share information with : Family Supports Permission granted to share information with : No (Contact information on chart)  Share Information with NAME: Edword Cu  Permission granted to share info w AGENCY: CIR  Permission granted to share info w Relationship: Spouse  Permission granted to share info w Contact Information: [phone]  Emotional Assessment Appearance:: Appears stated age     Orientation: : Oriented to Self, Oriented to Place, Oriented to Situation, Oriented to  Time Alcohol  / Substance Use: Not Applicable Psych Involvement: No (comment)  Admission diagnosis:  Closed fracture of left hip, initial encounter (HCC) [S72.002A] Fall at home, initial encounter [W19.Benny Braver, Y92.009] Patient Active Problem List   Diagnosis Date Noted   Malnutrition of moderate degree 11/01/2023   Fall at home, initial encounter 10/31/2023   Hyponatremia 04/25/2023   Right thalamic stroke (HCC) 04/12/2023   Acute CVA (cerebrovascular accident) (HCC) 04/08/2023   Acute ischemic stroke [facility])    Carotid dissection, bilateral (HCC)    Aphasia    Immunization due 05/20/2020   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   Medication monitoring encounter 03/07/2020   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   Bronchiectasis without complication (HCC) 11/22/2019   Hereditary spherocytosis (HCC) 08/29/2019   Seborrheic keratoses 08/29/2019   OSA (obstructive sleep apnea) 03/19/2019   History of TIA (transient ischemic  attack) 01/23/2018   Hemoptysis 07/15/2017   Pulmonary infiltrates 07/15/2017   History of tobacco use 06/06/2017   Essential hypertension 09/22/2015   Gastroesophageal reflux disease without esophagitis 06/05/2014   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 AT [address] OF [facility] [address] [place] [address] Phone: [phone] Fax: [phone]  [facility] [address] [place] [address] Phone: [phone] Fax: [phone]  [id number] [place], [place] - [address] AT [facility] [address] [place] [address] Phone: [phone] Fax: [phone]     Social Drivers of Health (SDOH) Social History: SDOH Screenings   Food Insecurity: No Food Insecurity (11/01/2023)  Housing: High Risk (11/01/2023)  Transportation Needs: No Transportation Needs (11/01/2023)  Utilities: Not At Risk (11/01/2023)  Depression (PHQ2-9): Low Risk  (07/01/2023)  Financial Resource Strain: Low Risk  (09/26/2023)   Received from [facility]  Physical Activity: Insufficiently Active (09/26/2023)   Received from [facility]  Social Connections: Moderately Isolated (11/01/2023)  Stress: No Stress Concern Present (09/26/2023)   Received from [facility]  Tobacco Use: Medium Risk (11/01/2023)   SDOH Interventions:     Readmission Risk Interventions     No data to display

## 2023-11-04 NOTE — Plan of Care (Signed)
  Problem: Clinical Measurements: Goal: Will remain free from infection Outcome: Progressing Goal: Diagnostic test results will improve Outcome: Progressing Goal: Cardiovascular complication will be avoided Outcome: Progressing   Problem: Activity: Goal: Risk for activity intolerance will decrease Outcome: Progressing   Problem: Nutrition: Goal: Adequate nutrition will be maintained Outcome: Progressing   Problem: Coping: Goal: Level of anxiety will decrease Outcome: Progressing   Problem: Elimination: Goal: Will not experience complications related to bowel motility Outcome: Progressing Goal: Will not experience complications related to urinary retention Outcome: Progressing   Problem: Pain Managment: Goal: General experience of comfort will improve and/or be controlled Outcome: Progressing   Problem: Safety: Goal: Ability to remain free from injury will improve Outcome: Progressing   Problem: Skin Integrity: Goal: Risk for impaired skin integrity will decrease Outcome: Progressing   Problem: Education: Goal: Verbalization of understanding the information provided (i.e., activity precautions, restrictions, etc) will improve Outcome: Progressing Goal: Individualized Educational Video(s) Outcome: Progressing   Problem: Activity: Goal: Ability to ambulate and perform ADLs will improve Outcome: Progressing   Problem: Clinical Measurements: Goal: Postoperative complications will be avoided or minimized Outcome: Progressing   Problem: Self-Concept: Goal: Ability to maintain and perform role responsibilities to the fullest extent possible will improve Outcome: Progressing   Problem: Pain Management: Goal: Pain level will decrease Outcome: Progressing   Problem: Education: Goal: Knowledge of General Education information will improve Description: Including pain rating scale, medication(s)/side effects and non-pharmacologic comfort measures Outcome: Not  Progressing   Problem: Health Behavior/Discharge Planning: Goal: Ability to manage health-related needs will improve Outcome: Not Progressing   Problem: Clinical Measurements: Goal: Ability to maintain clinical measurements within normal limits will improve Outcome: Not Progressing Goal: Respiratory complications will improve Outcome: Not Progressing

## 2023-11-04 NOTE — Progress Notes (Signed)
 Physical Therapy Treatment Patient Details Name: Samuel Mata MRN: [id number] DOB: 10/31/1930 Today's Date: 11/04/2023   History of Present Illness Pt is a 89 y/o M presenting to ED On 6/16 after fall, found to have L hip fx s/p IMN on 6/17. WBAT. PMH includes bronchiectasis, essential HTN, stroke, nephrolithaisis, nocardiosis pulmonary    PT Comments  Pt received in supine, drowsy but awakens to voice and agreeable to participate in therapy session with encouragement. Pt with improved participation in A/AAROM supine LLE exercises for ROM/strengthening with good quad set this date on LLE and following simple commands well with some delay likely due to [facility] status. Pt performed bed mobility and transfers with increased time and up to modA and able to perform short household gait trial and pivot to chair with min to modA +2 for safety, pt agreeable to sit up in recliner with ice pack to LLE at end of session. Mobility specialist agreeable to assist him with return transfer from chair to bed in 45 minutes to an hour. RN notified of pt c/o upper abdominal or esophageal pain that pt reports when swallowing small sips of water. Patient will benefit from intensive inpatient follow-up therapy, >3 hours/day.    If plan is discharge home, recommend the following: A little help with walking and/or transfers;A little help with bathing/dressing/bathroom;Assistance with cooking/housework;Assist for transportation;Help with stairs or ramp for entrance;Supervision due to cognitive status;Direct supervision/assist for medications management;Direct supervision/assist for financial management   Can travel by private vehicle        Equipment Recommendations  None recommended by PT    Recommendations for Other Services Rehab consult     Precautions / Restrictions Precautions Precautions: Fall;Other (comment) Recall of Precautions/Restrictions: Impaired Precaution/Restrictions Comments: c/o pain with swallowing  water; RN notified Restrictions Weight Bearing Restrictions Per Provider Order: Yes LLE Weight Bearing Per Provider Order: Weight bearing as tolerated     Mobility  Bed Mobility Overal bed mobility: Needs Assistance Bed Mobility: Supine to Sit     Supine to sit: Used rails, Mod assist, HOB elevated, +2 for safety/equipment     General bed mobility comments: to L EOB, dense multimodal cues for technique with pt able to assist well with BLE movements toward EOB, but needs increased assist, up to modA to advance hips to EOB then for trunk lift/stability.    Transfers Overall transfer level: Needs assistance Equipment used: Rolling walker (2 wheels) Transfers: Sit to/from Stand Sit to Stand: Min assist, Mod assist, +2 safety/equipment           General transfer comment: EOB<>RW with minA +2 and +1 modA for step pivot bed>chair with increased time and cues for body mechanics for reduced pain.    Ambulation/Gait Ambulation/Gait assistance: Mod assist, +2 safety/equipment Gait Distance (Feet): 18 Feet Assistive device: Rolling walker (2 wheels) Gait Pattern/deviations: Step-to pattern, Decreased stance time - left, Antalgic, Trunk flexed, Decreased dorsiflexion - left, Decreased step length - right, Decreased step length - left Gait velocity: grossly <0.2 m/s Gait velocity interpretation: <1.31 ft/sec, indicative of household ambulator   General Gait Details: Pt needed cues to sequence steps and RW. No buckling but pt c/o significant pain/fatigue post-exertion and has not had IV access for pain meds and now c/o pain when swallowing, RN aware. ~4ft in room, seated break, then ~68ft pivoting to chair from EOB.   Stairs             Wheelchair Mobility     Tilt Bed  Modified Rankin (Stroke Patients Only)       Balance Overall balance assessment: Needs assistance Sitting-balance support: Bilateral upper extremity supported, Feet supported Sitting balance-Leahy  Scale: Poor Sitting balance - Comments: seems to be using BUE to offload L hip, pt leaning slightly to R side EOB/chair   Standing balance support: Bilateral upper extremity supported, During functional activity, Reliant on assistive device for balance Standing balance-Leahy Scale: Poor Standing balance comment: static standing CGA with RW, min/modA dynamic standing tasks with RW                            Communication Communication Communication: Impaired Factors Affecting Communication: Hearing impaired;Other (comment) (has hearing aides)  Cognition Arousal: Lethargic (drowsy but awakens to voice and more alert with activity) Behavior During Therapy: Flat affect   PT - Cognitive impairments: Attention, Problem solving, Sequencing Difficult to assess due to: Hard of hearing/deaf                     PT - Cognition Comments: Pt awakens to cues today but needs consistent cues to maintain eyes open in supine, following simple commands well, able to understand supine HEP after min verbal/tactile cues. At times does not answer questions but appears due be due to [facility] status and drowsiness. Pt falling asleep once seated in chair. Following commands: Impaired Following commands impaired: Follows one step commands with increased time, Only follows one step commands consistently    Cueing Cueing Techniques: Verbal cues, Tactile cues, Gestural cues  Exercises Total Joint Exercises Ankle Circles/Pumps: Both, 10 reps, Supine, AROM Quad Sets: AROM, Both, 10 reps, Supine Short Arc Quad: 10 reps, Supine, AROM, Left Heel Slides: Left, Supine, AROM, AAROM, 10 reps Hip ABduction/ADduction: AAROM, Left, 10 reps, Supine (AA for improved ROM and to prevent L hip ER) Straight Leg Raises: AAROM, Left, Supine (a few reps to reposition) Long Arc Quad: AROM, Left, Seated, 10 reps    General Comments General comments (skin integrity, edema, etc.): SpO2 90-97% on 2L O2 [place] with  transfers/gait activity; HR WFL. Pt with mild c/o wooziness/nausea and reporting pain when swallowing but pointing to top of abdomen/arch of ribs, so unclear if this is gas related pain or actually related to his swallowing.      Pertinent Vitals/Pain Pain Assessment Pain Assessment: Faces Faces Pain Scale: Hurts even more Pain Location: left hip Pain Descriptors / Indicators: Aching, Discomfort, Grimacing, Guarding, Moaning Pain Intervention(s): Limited activity within patient's tolerance, Monitored during session, Repositioned, Other (comment), Ice applied, Patient requesting pain meds-RN notified (per RN, pt does not have PO meds ordered and pt does not currently have IV access, ice placed over his L lateral hip for comfort)    Home Living                          Prior Function            PT Goals (current goals can now be found in the care plan section) Acute Rehab PT Goals Patient Stated Goal: to go home after rehab PT Goal Formulation: With patient/family Time For Goal Achievement: 11/16/23 Progress towards PT goals: Progressing toward goals    Frequency    Min 3X/week      PT Plan      Co-evaluation              AM-PAC PT 6 Clicks Mobility   Outcome  Measure  Help needed turning from your back to your side while in a flat bed without using bedrails?: A Lot Help needed moving from lying on your back to sitting on the side of a flat bed without using bedrails?: A Lot Help needed moving to and from a bed to a chair (including a wheelchair)?: A Lot Help needed standing up from a chair using your arms (e.g., wheelchair or bedside chair)?: A Lot Help needed to walk in hospital room?: A Lot Help needed climbing 3-5 steps with a railing? : Total 6 Click Score: 11    End of Session Equipment Utilized During Treatment: Oxygen;Gait belt Activity Tolerance: Patient tolerated treatment well;Patient limited by fatigue;Patient limited by pain Patient left:  with call bell/phone within reach;in chair;with chair alarm set (tray table left out of his reach given pt c/o pain with swallowing, RN aware; extra chair under his feet for more ankle/foot support as they were hanging off the recliner surface) Nurse Communication: Mobility status;Other (comment);Patient requests pain meds;Precautions (c/o pain in hip and when swallowing) PT Visit Diagnosis: Muscle weakness (generalized) (M62.81);Unsteadiness on feet (R26.81);Other abnormalities of gait and mobility (R26.89);Pain Pain - Right/Left: Left Pain - part of body: Hip     Time: 4098-1191 PT Time Calculation (min) (ACUTE ONLY): 39 min  Charges:    $Gait Training: 8-22 mins $Therapeutic Exercise: 8-22 mins $Therapeutic Activity: 8-22 mins PT General Charges $$ ACUTE PT VISIT: 1 Visit                     Asta Corbridge P., PTA Acute Rehabilitation Services Secure Chat Preferred 9a-5:30pm Office: [phone]    Arville Laughter 11/04/2023, 12:59 PM

## 2023-11-04 NOTE — Progress Notes (Signed)
 Inpatient Rehabilitation Admissions Coordinator   I will begin Auth with [facility] for possible CIR admit.  Jeannetta Millman, RN, MSN Rehab Admissions Coordinator [phone] 11/04/2023 1:44 PM

## 2023-11-04 NOTE — Progress Notes (Addendum)
 Progress Note   Patient: Samuel Mata ZOX:[id number] DOB: May 21, 1930 DOA: 10/31/2023     4 DOS: the patient was seen and examined on 11/04/2023   Brief hospital course: [name] is a 89 y.o. male with PMH significant for HTN, HLD, stroke, h/o hereditary spherocytosis s/p splenectomy, pulmonary nocardiosis, bronchiectasis, nephrolithiasis, BPH, past history of smoking Patient lives at home with his wife.  At baseline has poor balance, supposed to use a rolling walker for ambulation but per family, he usually chooses not to. 6/16, patient was walking in his kitchen when he lost his balance and fell, sustained immediate left hip pain and could not get up.   In the ED, patient was afebrile, hemodynamically stable Blood work mostly unremarkable Urinalysis showed clear yellow urine with positive nitrite, small leukocytes. CTA did not show any evidence of acute intracranial abnormality.  It showed atrophy and chronic small vessel ischemic changes of the white matter. Chronic cerebellar and right thalamic infarcts. CT cervical spine did not show any acute fracture or subluxation Chest x-ray did not show any acute abnormality, stable chronic findings X-ray pelvis showed left hip fracture X-ray left knee showed degenerative changes without any acute fracture.   Orthopedics was consulted Admitted to [facility],s/p hip surgery, now pending placement to [facility] acquired delirium has resolved, avoiding sedative/hypnotics/restraints.  Assessment and Plan:  Left acute intertrochanteric fracture: s/p INTRAMEDULLARY NAILING OF THE LEFT HIP using a statically locked [employer] nail 11 X 400. POD 3 Osteoporotic pathological fracture of Left intertrochanteric   Secondary to fall from loss of balance Imaging as above. Orthopedics on board  Pain regimen: prn toradol, avoiding opioids  Bowel regimen: Scheduled Colace, as needed MiraLAX  DVT prophylaxis: Resumed plavix   Hospital acquired delirium:  Resolved now. On the night of 6/18. He developed agitation and was given IV ativan and PO seroquel . Avoid sedatives/BZDs. Started on low dose oral seroquel  at bedtime Frequent reorientation Avoid restraints Family at bedside.   Chronic gait impairment At baseline, supposed to use walker but prefers not to,per family.   PT OT eval   H/o stroke, HLD Continue with plavix  and statin   Essential hypertension PTA meds-recently switched from amlodipine  to Benicar 5 mg daily.    COPD As needed bronchodilators   h/o hereditary spherocytosis  s/p prior splenectomy   GERD Pepcid    BPH Flomax  0.4 mg daily    Mobility: PT OT on board   Goals of care   Code Status: Full Code   Disposition: CIR, case management on board. Seen by PMR as well.  Case discussed in length with the patient's daughter at the bedside.     Subjective: Patient was confused overnight. He removed his IV. Daughter at the bedside and is concerned. Patient is pulling off his nasal cannula. He is unable to communicate effectively.  Physical Exam: Vitals:   11/03/23 1955 11/03/23 1958 11/04/23 0507 11/04/23 0827  BP: (!) 150/72  (!) 150/70 125/67  Pulse: 92 81 72 85  Resp: 18  18   Temp: 98.3 F (36.8 C)  98.2 F (36.8 C) 98.1 F (36.7 C)  TempSrc:    Oral  SpO2: (!) 80% 93% 96% 94%  Weight:      Height:       Constitutional: ialert and awake, having breakfast, nasal cannula in place. Eyes: PERRL, lids and conjunctivae normal ENMT: Mucous membranes are moist. Posterior pharynx clear of any exudate or lesions.Normal dentition.  Neck: normal, supple, no masses, no thyromegaly Respiratory: clear to  auscultation bilaterally, no wheezing, no crackles. Normal respiratory effort. No accessory muscle use.  Cardiovascular: Regular rate and rhythm, no murmurs / rubs / gallops. No extremity edema. 2+ pedal pulses. No carotid bruits.  Abdomen: no tenderness, no masses palpated. No hepatosplenomegaly. Bowel sounds  positive.  Musculoskeletal: no clubbing / cyanosis. S/p right hip surgery, dressing in place Skin: no rashes, lesions, ulcers. No induration Neurologic: No focal neuro deficits Psychiatric: confused and disoriented,  Data Reviewed:  There are no new results to review at this time.  Family Communication: Daughter at bedside  Disposition: Status is: Inpatient Remains inpatient appropriate because: post hip surgery  Planned Discharge Destination: CIR    Time spent: 42 minutes  Author: Clancy Crimes, MD 11/04/2023 10:28 AM  For on call review [email or web address].

## 2023-11-04 NOTE — Plan of Care (Signed)
@  [facility]@ Dept: [phone]   [facility] Nurse Program Note  Date of Encounter: @ENCDATE @  Past Medical History: Past Medical History:  Diagnosis Date   Bronchiectasis without complication (HCC)    Enlarged prostate    Hearing loss    History of kidney stones    Hypertension    Pulmonary nocardiosis (HCC) 2021   treated with 6 month antibiotic course   Stroke (HCC) 2022   left frontoparietal   TIA (transient ischemic attack) 2019    Encounter Details:  Patient able to follow simple commands and is less confused this shift. Has slept most of the night without any problems noted and after visit with his daughter. Patient more calm.

## 2023-11-04 NOTE — Progress Notes (Signed)
  Inpatient Rehabilitation Admissions Coordinator   I have insurance approval and CIR bed to admit him to on Saturday. Dr Jamse Mcgee, acute team and [facility] made aware. I contacted daughter, Aron Lard, and she is aware and in agreement. 2 west nurse can call rehab unit at 12 noon Saturday to give report and make arrangements to admit at [phone].  Jeannetta Millman, RN, MSN Rehab Admissions Coordinator [phone] 11/04/2023 4:20 PM

## 2023-11-04 NOTE — H&P (Incomplete)
 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  Per chart review patient lives with spouse.  1 level home one-step to entry.  Independent without assistive device since CVA about 8 months ago but did have some balance impairments per daughter.  He did have an aide helping with shower 1 day a week wife completes other ADLs.  Presented 10/31/2023 after a fall in the kitchen when he lost his balance.  Denied loss of consciousness.  He experienced immediate left hip pain was brought to the emergency room.  Imaging of the pelvis demonstrated three-part left intertrochanteric hip fracture.  CT cervical spine did not show any acute abnormalities.  CTA and CT of the head did not demonstrate any acute intracranial abnormality or large vessel occlusion.  Chronic cerebellar and right thalamic infarcts were seen however.  On 11/01/2023 patient underwent intramedullary nailing of the left hip per Dr. Guyann Leitz.  Postoperative Plavix  for history of CVA resumed.  Postoperatively noted some confusion maintained on Seroquel  as well as mild hypokalemia improved to 3.6, leukocytosis 13,000 improved to 10,600.  He did spike a low-grade fever 99.2.  He is weightbearing as tolerated to the left lower extremity.  Therapy evaluations completed due to patient decreased functional mobility was admitted for a comprehensive rehab program.  Review of Systems  Constitutional:  Positive for fever.  HENT:  Positive for hearing loss.   Eyes:  Negative for double vision.  Respiratory:  Negative for cough, shortness of breath and wheezing.   Cardiovascular:  Negative for chest pain, palpitations and leg swelling.  Gastrointestinal:  Positive for  constipation. Negative for heartburn, nausea and vomiting.  Genitourinary:  Positive for urgency. Negative for dysuria, flank pain and hematuria.  Musculoskeletal:  Positive for falls, joint pain and myalgias.  Skin:  Negative for rash.  All other systems reviewed and are negative.  Past Medical History:  Diagnosis Date   Bronchiectasis without complication (HCC)    Enlarged prostate    Hearing loss    History of kidney stones    Hypertension    Pulmonary nocardiosis (HCC) 2021   treated with 6 month antibiotic course   Stroke (HCC) 2022   left frontoparietal   TIA (transient ischemic attack) 2019   Past Surgical History:  Procedure Laterality Date   APPENDECTOMY     CHOLECYSTECTOMY     INTRAMEDULLARY (IM) NAIL INTERTROCHANTERIC Left 11/01/2023   Procedure: FIXATION, FRACTURE, INTERTROCHANTERIC, WITH INTRAMEDULLARY ROD;  Surgeon: Hardy Lia, MD;  Location: [facility] OR;  Service: Orthopedics;  Laterality: Left;   LOOP RECORDER INSERTION N/A 01/15/2021   Procedure: LOOP RECORDER INSERTION;  Surgeon: Jolly Needle, MD;  Location: [facility];  Service: Cardiovascular;  Laterality: N/A;   NOSE SURGERY     SPINE SURGERY     compressoin fx T12, kyphoplasty   SPLENECTOMY, TOTAL     Family History  Problem Relation Age of Onset   Lung disease Neg Hx    Social History:  reports that he quit smoking about 39 years ago. His smoking use included cigarettes. He started smoking about 69 years ago. He has a 30 pack-year smoking history. He has never used smokeless tobacco. He reports that he does not drink alcohol  and does not  use drugs. Allergies:  Allergies  Allergen Reactions   Codeine     Unknown reaction   Medications Prior to Admission  Medication Sig Dispense Refill   clopidogrel  (PLAVIX ) 75 MG tablet Take 1 tablet (75 mg total) by mouth daily. 90 tablet 0   famotidine  (PEPCID ) 20 MG tablet Take 1 tablet (20 mg total) by mouth daily. (Patient taking differently: Take 20 mg  by mouth every evening.) 30 tablet 0   fluticasone  (FLONASE ) 50 MCG/ACT nasal spray Place 2 sprays into both nostrils daily as needed for allergies.     Naphazoline HCl (CLEAR EYES OP) Place 1 drop into both eyes daily as needed (for eye redness).     olmesartan (BENICAR) 5 MG tablet Take 5 mg by mouth daily.     polyvinyl alcohol  (LIQUIFILM TEARS) 1.4 % ophthalmic solution Place 2 drops into the right eye 4 (four) times daily. (Patient taking differently: Place 2 drops into the right eye daily as needed for dry eyes.) 15 mL 0   rosuvastatin  (CRESTOR ) 20 MG tablet Take 1 tablet (20 mg total) by mouth daily with supper. 30 tablet 0   sodium chloride  (OCEAN) 0.65 % nasal spray Place 1 spray into the nose as needed for congestion.     tamsulosin  (FLOMAX ) 0.4 MG CAPS capsule Take 1 capsule (0.4 mg total) by mouth daily. (Patient taking differently: Take 0.4 mg by mouth daily after supper.) 90 capsule 1   acetaminophen  (TYLENOL ) 325 MG tablet Take 650 mg by mouth as needed for headache.     amLODipine  (NORVASC ) 5 MG tablet Take 1 tablet (5 mg total) by mouth daily. (Patient not taking: Reported on 10/31/2023) 30 tablet 0   nitroGLYCERIN  (NITROSTAT ) 0.4 MG SL tablet Place 1 tablet (0.4 mg total) under the tongue every 5 (five) minutes as needed for chest pain. 25 tablet 4      Home: Home Living Family/patient expects to be discharged to:: Private residence Living Arrangements: Spouse/significant other Available Help at Discharge: Family, Available 24 hours/day (wife can only provide supervision level) Type of Home: House Home Access: Stairs to enter [employer] of Steps: 1 + 1 Entrance Stairs-Rails: None Home Layout: One level Bathroom Shower/Tub: Engineer, manufacturing systems: Standard Bathroom Accessibility: Yes Home Equipment: Cane - single point, Grab bars - tub/shower, Tub bench, Rolling Walker (2 wheels), Toilet riser Additional Comments: walked around neighborhood  every day  Lives With: Spouse   Functional History: Prior Function Prior Level of Function : Independent/Modified Independent Mobility Comments: ind without device since CVA about 8 months ago but did have some balance impairment per daughter ADLs Comments: aide was helping pt with shower 1 day a week;  wife completes iADls  Functional Status:  Mobility: Bed Mobility Overal bed mobility: Needs Assistance Bed Mobility: Supine to Sit Supine to sit: Used rails, Mod assist, HOB elevated, +2 for safety/equipment General bed mobility comments: to L EOB, dense multimodal cues for technique with pt able to assist well with BLE movements toward EOB, but needs increased assist, up to modA to advance hips to EOB then for trunk lift/stability. Transfers Overall transfer level: Needs assistance Equipment used: Rolling walker (2 wheels) Transfers: Sit to/from Stand Sit to Stand: Min assist, Mod assist, +2 safety/equipment General transfer comment: EOB<>RW with minA +2 and +1 modA for step pivot bed>chair with increased time and cues for body mechanics for reduced pain. Ambulation/Gait Ambulation/Gait assistance: Mod assist, +2 safety/equipment Gait Distance (Feet): 18 Feet Assistive device: Rolling walker (2  wheels) Gait Pattern/deviations: Step-to pattern, Decreased stance time - left, Antalgic, Trunk flexed, Decreased dorsiflexion - left, Decreased step length - right, Decreased step length - left General Gait Details: Pt needed cues to sequence steps and RW. No buckling but pt c/o significant pain/fatigue post-exertion and has not had IV access for pain meds and now c/o pain when swallowing, RN aware. ~41ft in room, seated break, then ~30ft pivoting to chair from EOB. Gait velocity: grossly <0.2 m/s Gait velocity interpretation: <1.31 ft/sec, indicative of household ambulator    ADL: ADL Overall ADL's : Needs assistance/impaired Eating/Feeding: Set up, Sitting Grooming: Minimal assistance,  Sitting Upper Body Bathing: Moderate assistance, Sitting, Bed level Lower Body Bathing: Maximal assistance, Sitting/lateral leans, Bed level Upper Body Dressing : Moderate assistance, Sitting, Bed level Lower Body Dressing: Maximal assistance, Sitting/lateral leans, Bed level Toilet Transfer: Moderate assistance, +2 for physical assistance Toileting- Clothing Manipulation and Hygiene: Maximal assistance Functional mobility during ADLs: Moderate assistance, +2 for physical assistance  Cognition: Cognition Orientation Level: Oriented X4 Cognition Arousal: Lethargic (drowsy but awakens to voice and more alert with activity) Behavior During Therapy: Flat affect  Physical Exam: Blood pressure 125/67, pulse 85, temperature 98.1 F (36.7 C), temperature source Oral, resp. rate 18, height 5' 7 (1.702 m), weight 64.4 kg, SpO2 94%. Physical Exam  Neurological:     Comments: Patient is alert.  No acute distress.  Makes eye contact with examiner.  He does have some dysarthric speech.  Provides name but decreased awareness.    Results for orders placed or performed during the hospital encounter of 10/31/23 (from the past 48 hours)  CBC with Differential/Platelet     Status: Abnormal   Collection Time: 11/03/23  3:24 AM  Result Value Ref Range   WBC 13.0 (H) 4.0 - 10.5 K/uL   RBC 4.31 4.22 - 5.81 MIL/uL   Hemoglobin 13.4 13.0 - 17.0 g/dL   HCT 13.0 86.5 - 78.4 %   MCV 94.0 80.0 - 100.0 fL   MCH 31.1 26.0 - 34.0 pg   MCHC 33.1 30.0 - 36.0 g/dL   RDW 69.6 29.5 - 28.4 %   Platelets 271 150 - 400 K/uL   nRBC 0.0 0.0 - 0.2 %   Neutrophils Relative % 80 %   Neutro Abs 10.3 (H) 1.7 - 7.7 K/uL   Lymphocytes Relative 5 %   Lymphs Abs 0.7 0.7 - 4.0 K/uL   Monocytes Relative 14 %   Monocytes Absolute 1.8 (H) 0.1 - 1.0 K/uL   Eosinophils Relative 1 %   Eosinophils Absolute 0.1 0.0 - 0.5 K/uL   Basophils Relative 0 %   Basophils Absolute 0.0 0.0 - 0.1 K/uL   Immature Granulocytes 0 %   Abs  Immature Granulocytes 0.05 0.00 - 0.07 K/uL    Comment: Performed at [facility], [address] [place], [place] [address]  Basic metabolic panel with GFR     Status: Abnormal   Collection Time: 11/03/23  3:24 AM  Result Value Ref Range   Sodium 137 135 - 145 mmol/L   Potassium 3.4 (L) 3.5 - 5.1 mmol/L   Chloride 104 98 - 111 mmol/L   CO2 25 22 - 32 mmol/L   Glucose, Bld 103 (H) 70 - 99 mg/dL    Comment: Glucose reference range applies only to samples taken after fasting for at least 8 hours.   BUN 18 8 - 23 mg/dL   Creatinine, Ser 0.10 0.61 - 1.24 mg/dL   Calcium   8.7 (L) 8.9 - 10.3 mg/dL   GFR, Estimated >16 >10 mL/min    Comment: (NOTE) Calculated using the CKD-EPI Creatinine Equation (2021)    Anion gap 8 5 - 15    Comment: Performed at [facility], [address], [place], [place] [address]  CBC with Differential/Platelet     Status: Abnormal   Collection Time: 11/04/23  3:25 AM  Result Value Ref Range   WBC 10.6 (H) 4.0 - 10.5 K/uL   RBC 3.58 (L) 4.22 - 5.81 MIL/uL   Hemoglobin 11.1 (L) 13.0 - 17.0 g/dL   HCT 40.9 (L) 81.1 - 91.4 %   MCV 94.7 80.0 - 100.0 fL   MCH 31.0 26.0 - 34.0 pg   MCHC 32.7 30.0 - 36.0 g/dL   RDW 78.2 95.6 - 21.3 %   Platelets 242 150 - 400 K/uL   nRBC 0.0 0.0 - 0.2 %   Neutrophils Relative % 70 %   Neutro Abs 7.2 1.7 - 7.7 K/uL   Lymphocytes Relative 9 %   Lymphs Abs 1.0 0.7 - 4.0 K/uL   Monocytes Relative 17 %   Monocytes Absolute 1.8 (H) 0.1 - 1.0 K/uL   Eosinophils Relative 4 %   Eosinophils Absolute 0.5 0.0 - 0.5 K/uL   Basophils Relative 0 %   Basophils Absolute 0.0 0.0 - 0.1 K/uL   Immature Granulocytes 0 %   Abs Immature Granulocytes 0.04 0.00 - 0.07 K/uL    Comment: Performed at [facility], [address], [place], [place] [address]  Basic metabolic panel with GFR     Status: Abnormal   Collection Time: 11/04/23  3:25 AM  Result Value Ref Range   Sodium 137 135 - 145 mmol/L   Potassium 3.6 3.5 - 5.1 mmol/L    Chloride 104 98 - 111 mmol/L   CO2 26 22 - 32 mmol/L   Glucose, Bld 89 70 - 99 mg/dL    Comment: Glucose reference range applies only to samples taken after fasting for at least 8 hours.   BUN 19 8 - 23 mg/dL   Creatinine, Ser 8.46 0.61 - 1.24 mg/dL   Calcium  8.3 (L) 8.9 - 10.3 mg/dL   GFR, Estimated >96 >29 mL/min    Comment: (NOTE) Calculated using the CKD-EPI Creatinine Equation (2021)    Anion gap 7 5 - 15    Comment: Performed at [facility], [address] [place], [place] [address]   No results found.    Blood pressure 125/67, pulse 85, temperature 98.1 F (36.7 C), temperature source Oral, resp. rate 18, height 5' 7 (1.702 m), weight 64.4 kg, SpO2 94%.  Medical Problem List and Plan: 1. Functional deficits secondary to left three-part intertrochanteric hip fracture with subsequent IM nailing 11/01/2023.  Weightbearing as tolerated  -patient may *** shower  -ELOS/Goals: *** 2.  Antithrombotics: -DVT/anticoagulation:  Mechanical: Antiembolism stockings, thigh (TED hose) Bilateral lower extremities.  Check vascular study  -antiplatelet therapy: Plavix  75 mg daily 3. Pain Management: Robaxin as needed, discontinue IM Toradol and begin tramadol 4. Mood/Behavior/Sleep: Provide emotional support  -antipsychotic agents: Seroquel  25 mg nightly 5. Neuropsych/cognition: This patient is not capable of making decisions on his own behalf. 6. Skin/Wound Care: Routine skin checks 7. Fluids/Electrolytes/Nutrition: Routine in and outs with follow-up chemistries 8.  History of right thalamic infarction.  Received CIR.  Continue Plavix  9.  BPH.  Flomax  0.4 mg daily check PVRs 10.  Hypertension.  Monitor with increased  mobility.  Benicar currently on hold due to some soft blood pressures and resume as needed 11.  Crestor  20 mg daily    Sterling Eisenmenger, PA-C 11/04/2023

## 2023-11-05 ENCOUNTER — Inpatient Hospital Stay (HOSPITAL_COMMUNITY)
Admission: AD | Admit: 2023-11-05 | Discharge: 2023-11-23 | DRG: 561 | Disposition: A | Discharge status: Home Health | Source: Intra-hospital | Attending: Physical Medicine & Rehabilitation | Admitting: Physical Medicine & Rehabilitation

## 2023-11-05 ENCOUNTER — Other Ambulatory Visit: Payer: Self-pay

## 2023-11-05 ENCOUNTER — Inpatient Hospital Stay (HOSPITAL_COMMUNITY)

## 2023-11-05 ENCOUNTER — Encounter (HOSPITAL_COMMUNITY): Payer: Self-pay | Admitting: Physical Medicine & Rehabilitation

## 2023-11-05 DIAGNOSIS — G8918 Other acute postprocedural pain: Secondary | ICD-10-CM | POA: Diagnosis not present

## 2023-11-05 DIAGNOSIS — Z7902 Long term (current) use of antithrombotics/antiplatelets: Secondary | ICD-10-CM | POA: Diagnosis not present

## 2023-11-05 DIAGNOSIS — D72829 Elevated white blood cell count, unspecified: Secondary | ICD-10-CM | POA: Diagnosis present

## 2023-11-05 DIAGNOSIS — I10 Essential (primary) hypertension: Secondary | ICD-10-CM | POA: Diagnosis present

## 2023-11-05 DIAGNOSIS — G4700 Insomnia, unspecified: Secondary | ICD-10-CM | POA: Diagnosis not present

## 2023-11-05 DIAGNOSIS — Z9081 Acquired absence of spleen: Secondary | ICD-10-CM | POA: Diagnosis not present

## 2023-11-05 DIAGNOSIS — N402 Nodular prostate without lower urinary tract symptoms: Secondary | ICD-10-CM

## 2023-11-05 DIAGNOSIS — S72145S Nondisplaced intertrochanteric fracture of left femur, sequela: Secondary | ICD-10-CM | POA: Diagnosis not present

## 2023-11-05 DIAGNOSIS — S72142D Displaced intertrochanteric fracture of left femur, subsequent encounter for closed fracture with routine healing: Secondary | ICD-10-CM | POA: Diagnosis present

## 2023-11-05 DIAGNOSIS — E785 Hyperlipidemia, unspecified: Secondary | ICD-10-CM | POA: Diagnosis present

## 2023-11-05 DIAGNOSIS — D62 Acute posthemorrhagic anemia: Secondary | ICD-10-CM | POA: Diagnosis not present

## 2023-11-05 DIAGNOSIS — R32 Unspecified urinary incontinence: Secondary | ICD-10-CM | POA: Diagnosis not present

## 2023-11-05 DIAGNOSIS — Z8673 Personal history of transient ischemic attack (TIA), and cerebral infarction without residual deficits: Secondary | ICD-10-CM | POA: Diagnosis not present

## 2023-11-05 DIAGNOSIS — E876 Hypokalemia: Secondary | ICD-10-CM | POA: Diagnosis present

## 2023-11-05 DIAGNOSIS — J449 Chronic obstructive pulmonary disease, unspecified: Secondary | ICD-10-CM | POA: Diagnosis present

## 2023-11-05 DIAGNOSIS — E559 Vitamin D deficiency, unspecified: Secondary | ICD-10-CM | POA: Diagnosis present

## 2023-11-05 DIAGNOSIS — F54 Psychological and behavioral factors associated with disorders or diseases classified elsewhere: Secondary | ICD-10-CM | POA: Diagnosis not present

## 2023-11-05 DIAGNOSIS — H9190 Unspecified hearing loss, unspecified ear: Secondary | ICD-10-CM | POA: Diagnosis present

## 2023-11-05 DIAGNOSIS — D649 Anemia, unspecified: Secondary | ICD-10-CM | POA: Diagnosis not present

## 2023-11-05 DIAGNOSIS — N400 Enlarged prostate without lower urinary tract symptoms: Secondary | ICD-10-CM | POA: Diagnosis present

## 2023-11-05 DIAGNOSIS — S72145A Nondisplaced intertrochanteric fracture of left femur, initial encounter for closed fracture: Secondary | ICD-10-CM

## 2023-11-05 DIAGNOSIS — K5900 Constipation, unspecified: Secondary | ICD-10-CM | POA: Diagnosis present

## 2023-11-05 DIAGNOSIS — S72142A Displaced intertrochanteric fracture of left femur, initial encounter for closed fracture: Secondary | ICD-10-CM | POA: Diagnosis not present

## 2023-11-05 DIAGNOSIS — W010XXD Fall on same level from slipping, tripping and stumbling without subsequent striking against object, subsequent encounter: Secondary | ICD-10-CM | POA: Diagnosis present

## 2023-11-05 DIAGNOSIS — Z79899 Other long term (current) drug therapy: Secondary | ICD-10-CM

## 2023-11-05 DIAGNOSIS — Z87891 Personal history of nicotine dependence: Secondary | ICD-10-CM | POA: Diagnosis not present

## 2023-11-05 DIAGNOSIS — K5901 Slow transit constipation: Secondary | ICD-10-CM | POA: Diagnosis not present

## 2023-11-05 MED ORDER — MENTHOL 3 MG MT LOZG: 1.0000 | LOZENGE | OROMUCOSAL | Status: DC | PRN

## 2023-11-05 MED ORDER — ENSURE PLUS HIGH PROTEIN PO LIQD: 237.0000 mL | Freq: Two times a day (BID) | ORAL | Status: DC

## 2023-11-05 MED ORDER — TAMSULOSIN HCL 0.4 MG PO CAPS
0.4000 mg | ORAL_CAPSULE | Freq: Every day | ORAL | Status: DC
  Administered 2023-11-05 – 2023-11-22 (×18): 0.4 mg via ORAL
  Filled 2023-11-05 (×18): qty 1

## 2023-11-05 MED ORDER — METHOCARBAMOL 500 MG PO TABS: 500.0000 mg | ORAL_TABLET | Freq: Four times a day (QID) | ORAL | Status: DC | PRN

## 2023-11-05 MED ORDER — FAMOTIDINE 20 MG PO TABS
20.0000 mg | ORAL_TABLET | Freq: Every day | ORAL | Status: DC
  Administered 2023-11-06 – 2023-11-23 (×18): 20 mg via ORAL
  Filled 2023-11-05 (×19): qty 1

## 2023-11-05 MED ORDER — VITAMIN D (ERGOCALCIFEROL) 1.25 MG (50000 UNIT) PO CAPS
50000.0000 [IU] | ORAL_CAPSULE | ORAL | Status: DC
  Administered 2023-11-11 – 2023-11-18 (×2): 50000 [IU] via ORAL
  Filled 2023-11-05 (×2): qty 1

## 2023-11-05 MED ORDER — VITAMIN D (ERGOCALCIFEROL) 1.25 MG (50000 UNIT) PO CAPS: 50000.0000 [IU] | ORAL_CAPSULE | ORAL | Status: DC

## 2023-11-05 MED ORDER — OXYCODONE HCL 5 MG PO TABS: 5.0000 mg | ORAL_TABLET | Freq: Four times a day (QID) | ORAL | Status: DC | PRN

## 2023-11-05 MED ORDER — CLOPIDOGREL BISULFATE 75 MG PO TABS
75.0000 mg | ORAL_TABLET | Freq: Every day | ORAL | Status: DC
  Administered 2023-11-06 – 2023-11-23 (×18): 75 mg via ORAL
  Filled 2023-11-05 (×19): qty 1

## 2023-11-05 MED ORDER — IPRATROPIUM-ALBUTEROL 0.5-2.5 (3) MG/3ML IN SOLN
3.0000 mL | Freq: Four times a day (QID) | RESPIRATORY_TRACT | Status: DC | PRN
Start: 2023-11-05 — End: 2023-11-23

## 2023-11-05 MED ORDER — DOCUSATE SODIUM 100 MG PO CAPS
100.0000 mg | ORAL_CAPSULE | Freq: Two times a day (BID) | ORAL | Status: DC
  Administered 2023-11-06 – 2023-11-08 (×5): 100 mg via ORAL
  Filled 2023-11-05 (×6): qty 1

## 2023-11-05 MED ORDER — POLYETHYLENE GLYCOL 3350 17 G PO PACK: 17.0000 g | PACK | Freq: Every day | ORAL | Status: DC | PRN

## 2023-11-05 MED ORDER — TRAMADOL HCL 50 MG PO TABS
50.0000 mg | ORAL_TABLET | Freq: Four times a day (QID) | ORAL | Status: DC | PRN
  Administered 2023-11-06 (×2): 50 mg via ORAL
  Filled 2023-11-05 (×3): qty 1

## 2023-11-05 MED ORDER — ACETAMINOPHEN 325 MG PO TABS
325.0000 mg | ORAL_TABLET | ORAL | Status: DC | PRN
  Administered 2023-11-06 – 2023-11-08 (×2): 650 mg via ORAL
  Filled 2023-11-05 (×2): qty 2

## 2023-11-05 MED ORDER — ROSUVASTATIN CALCIUM 20 MG PO TABS
20.0000 mg | ORAL_TABLET | Freq: Every day | ORAL | Status: DC
  Administered 2023-11-05 – 2023-11-23 (×19): 20 mg via ORAL
  Filled 2023-11-05 (×19): qty 1

## 2023-11-05 MED ORDER — QUETIAPINE FUMARATE 25 MG PO TABS
25.0000 mg | ORAL_TABLET | Freq: Every day | ORAL | Status: DC
Start: 2023-11-05 — End: 2023-11-06
  Filled 2023-11-05: qty 1

## 2023-11-05 MED ORDER — ADULT MULTIVITAMIN W/MINERALS CH
1.0000 | ORAL_TABLET | Freq: Every day | ORAL | Status: DC
  Administered 2023-11-06 – 2023-11-08 (×3): 1 via ORAL
  Filled 2023-11-05 (×4): qty 1

## 2023-11-05 MED ORDER — ENSURE PLUS HIGH PROTEIN PO LIQD
237.0000 mL | Freq: Two times a day (BID) | ORAL | Status: DC
Start: 2023-11-05 — End: 2023-11-23
  Administered 2023-11-06 – 2023-11-21 (×23): 237 mL via ORAL

## 2023-11-05 MED ORDER — ONDANSETRON HCL 4 MG/2ML IJ SOLN: 4.0000 mg | Freq: Four times a day (QID) | INTRAMUSCULAR | Status: DC | PRN

## 2023-11-05 MED ORDER — ALUM & MAG HYDROXIDE-SIMETH 200-200-20 MG/5ML PO SUSP: 15.0000 mL | ORAL | Status: DC | PRN

## 2023-11-05 MED ORDER — ONDANSETRON HCL 4 MG PO TABS: 4.0000 mg | ORAL_TABLET | Freq: Four times a day (QID) | ORAL | Status: DC | PRN

## 2023-11-05 MED ORDER — ADULT MULTIVITAMIN W/MINERALS CH: 1.0000 | ORAL_TABLET | Freq: Every day | ORAL | Status: AC

## 2023-11-05 MED ORDER — METHOCARBAMOL 500 MG PO TABS
500.0000 mg | ORAL_TABLET | Freq: Four times a day (QID) | ORAL | Status: DC | PRN
  Administered 2023-11-06: 500 mg via ORAL
  Filled 2023-11-05: qty 1

## 2023-11-05 MED ORDER — BISACODYL 5 MG PO TBEC: 5.0000 mg | DELAYED_RELEASE_TABLET | Freq: Every day | ORAL | Status: DC | PRN

## 2023-11-05 NOTE — H&P (Signed)
 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  Per chart review patient lives with spouse.  1 level home one-step to entry.  Independent without assistive device since CVA about 8 months ago but did have some balance impairments per daughter.  He did have an aide helping with shower 1 day a week wife completes other ADLs.  Presented 10/31/2023 after a fall in the kitchen when he lost his balance.  Denied loss of consciousness.  He experienced immediate left hip pain was brought to the emergency room.  Imaging of the pelvis demonstrated three-part left intertrochanteric hip fracture.  CT cervical spine did not show any acute abnormalities.  CTA and CT of the head did not demonstrate any acute intracranial abnormality or large vessel occlusion.  Chronic cerebellar and right thalamic infarcts were seen however.  On 11/01/2023 patient underwent intramedullary nailing of the left hip per Dr. Celena.  Postoperative Plavix  for history of CVA resumed.  Postoperatively noted some confusion maintained on Seroquel  as well as mild hypokalemia improved to 3.6, leukocytosis 13,000 improved to 10,600.  He did spike a low-grade fever 99.2.  He is weightbearing as tolerated to the left lower extremity.  Therapy evaluations completed due to patient decreased functional mobility was admitted for a comprehensive rehab program.   Review of Systems  Constitutional:  Positive for fever.  HENT:  Positive for hearing loss.   Eyes:  Negative for double vision.  Respiratory:  Negative for cough, shortness of breath and wheezing.   Cardiovascular:  Negative for chest pain, palpitations and leg swelling.  Gastrointestinal:  Positive  for constipation. Negative for heartburn, nausea and vomiting.  Genitourinary:  Positive for urgency. Negative for dysuria, flank pain and hematuria.  Musculoskeletal:  Positive for falls, joint pain and myalgias.  Skin:  Negative for rash.  All other systems reviewed and are negative.       Past Medical History:  Diagnosis Date   Bronchiectasis without complication (HCC)     Enlarged prostate     Hearing loss     History of kidney stones     Hypertension     Pulmonary nocardiosis (HCC) 2021    treated with 6 month antibiotic course   Stroke (HCC) 2022    left frontoparietal   TIA (transient ischemic attack) 2019             Past Surgical History:  Procedure Laterality Date   APPENDECTOMY       CHOLECYSTECTOMY       INTRAMEDULLARY (IM) NAIL INTERTROCHANTERIC Left 11/01/2023    Procedure: FIXATION, FRACTURE, INTERTROCHANTERIC, WITH INTRAMEDULLARY ROD;  Surgeon: Celena Sharper, MD;  Location: [facility] OR;  Service: Orthopedics;  Laterality: Left;   LOOP RECORDER INSERTION N/A 01/15/2021    Procedure: LOOP RECORDER INSERTION;  Surgeon: Kelsie Agent, MD;  Location: [facility];  Service: Cardiovascular;  Laterality: N/A;   NOSE SURGERY       SPINE SURGERY        compressoin fx T12, kyphoplasty   SPLENECTOMY, TOTAL                 Family History  Problem Relation Age of Onset   Lung disease Neg Hx  Social History:  reports that he quit smoking about 39 years ago. His smoking use included cigarettes. He started smoking about 69 years ago. He has a 30 pack-year smoking history. He has never used smokeless tobacco. He reports that he does not drink alcohol  and does not use drugs. Allergies:  Allergies       Allergies  Allergen Reactions   Codeine        Unknown reaction            Medications Prior to Admission  Medication Sig Dispense Refill   clopidogrel  (PLAVIX ) 75 MG tablet Take 1 tablet (75 mg total) by mouth daily. 90 tablet 0   famotidine  (PEPCID ) 20  MG tablet Take 1 tablet (20 mg total) by mouth daily. (Patient taking differently: Take 20 mg by mouth every evening.) 30 tablet 0   fluticasone  (FLONASE ) 50 MCG/ACT nasal spray Place 2 sprays into both nostrils daily as needed for allergies.       Naphazoline HCl (CLEAR EYES OP) Place 1 drop into both eyes daily as needed (for eye redness).       olmesartan (BENICAR) 5 MG tablet Take 5 mg by mouth daily.       polyvinyl alcohol  (LIQUIFILM TEARS) 1.4 % ophthalmic solution Place 2 drops into the right eye 4 (four) times daily. (Patient taking differently: Place 2 drops into the right eye daily as needed for dry eyes.) 15 mL 0   rosuvastatin  (CRESTOR ) 20 MG tablet Take 1 tablet (20 mg total) by mouth daily with supper. 30 tablet 0   sodium chloride  (OCEAN) 0.65 % nasal spray Place 1 spray into the nose as needed for congestion.       tamsulosin  (FLOMAX ) 0.4 MG CAPS capsule Take 1 capsule (0.4 mg total) by mouth daily. (Patient taking differently: Take 0.4 mg by mouth daily after supper.) 90 capsule 1   acetaminophen  (TYLENOL ) 325 MG tablet Take 650 mg by mouth as needed for headache.       amLODipine  (NORVASC ) 5 MG tablet Take 1 tablet (5 mg total) by mouth daily. (Patient not taking: Reported on 10/31/2023) 30 tablet 0   nitroGLYCERIN  (NITROSTAT ) 0.4 MG SL tablet Place 1 tablet (0.4 mg total) under the tongue every 5 (five) minutes as needed for chest pain. 25 tablet 4              Home: Home Living Family/patient expects to be discharged to:: Private residence Living Arrangements: Spouse/significant other Available Help at Discharge: Family, Available 24 hours/day (wife can only provide supervision level) Type of Home: House Home Access: Stairs to enter [employer] of Steps: 1 + 1 Entrance Stairs-Rails: None Home Layout: One level Bathroom Shower/Tub: Engineer, manufacturing systems: Standard Bathroom Accessibility: Yes Home Equipment: Cane - single point, Grab bars -  tub/shower, Tub bench, Rolling Walker (2 wheels), Toilet riser Additional Comments: walked around neighborhood every day  Lives With: Spouse   Functional History: Prior Function Prior Level of Function : Independent/Modified Independent Mobility Comments: ind without device since CVA about 8 months ago but did have some balance impairment per daughter ADLs Comments: aide was helping pt with shower 1 day a week;  wife completes iADls   Functional Status:  Mobility: Bed Mobility Overal bed mobility: Needs Assistance Bed Mobility: Supine to Sit Supine to sit: Used rails, Mod assist, HOB elevated, +2 for safety/equipment General bed mobility comments: to L EOB, dense multimodal cues for technique with pt able to assist well with BLE  movements toward EOB, but needs increased assist, up to modA to advance hips to EOB then for trunk lift/stability. Transfers Overall transfer level: Needs assistance Equipment used: Rolling walker (2 wheels) Transfers: Sit to/from Stand Sit to Stand: Min assist, Mod assist, +2 safety/equipment General transfer comment: EOB<>RW with minA +2 and +1 modA for step pivot bed>chair with increased time and cues for body mechanics for reduced pain. Ambulation/Gait Ambulation/Gait assistance: Mod assist, +2 safety/equipment Gait Distance (Feet): 18 Feet Assistive device: Rolling walker (2 wheels) Gait Pattern/deviations: Step-to pattern, Decreased stance time - left, Antalgic, Trunk flexed, Decreased dorsiflexion - left, Decreased step length - right, Decreased step length - left General Gait Details: Pt needed cues to sequence steps and RW. No buckling but pt c/o significant pain/fatigue post-exertion and has not had IV access for pain meds and now c/o pain when swallowing, RN aware. ~60ft in room, seated break, then ~10ft pivoting to chair from EOB. Gait velocity: grossly <0.2 m/s Gait velocity interpretation: <1.31 ft/sec, indicative of household ambulator    ADL: ADL Overall ADL's : Needs assistance/impaired Eating/Feeding: Set up, Sitting Grooming: Minimal assistance, Sitting Upper Body Bathing: Moderate assistance, Sitting, Bed level Lower Body Bathing: Maximal assistance, Sitting/lateral leans, Bed level Upper Body Dressing : Moderate assistance, Sitting, Bed level Lower Body Dressing: Maximal assistance, Sitting/lateral leans, Bed level Toilet Transfer: Moderate assistance, +2 for physical assistance Toileting- Clothing Manipulation and Hygiene: Maximal assistance Functional mobility during ADLs: Moderate assistance, +2 for physical assistance   Cognition: Cognition Orientation Level: Oriented X4 Cognition Arousal: Lethargic (drowsy but awakens to voice and more alert with activity) Behavior During Therapy: Flat affect   Physical Exam: Blood pressure 125/67, pulse 85, temperature 98.1 F (36.7 C), temperature source Oral, resp. rate 18, height 5' 7 (1.702 m), weight 64.4 kg, SpO2 94%. Physical Exam   PE: Constitution: Appropriate appearance for age. No apparent distress  Resp: No respiratory distress. No accessory muscle usage. CTAB and on 1 L [place] Cardio: Well perfused appearance.  Giller rate and rhythm trace edema along left hip. Abdomen: Nondistended. Nontender.  Positive bowel sounds, normal active Psych: Appropriate mood and affect. Skin: Surgical sites covered in Mepilex; no drainage  Neuro: AAOx4.  Mild cognitive/memory deficits.  DTRs: Reflexes were 2+ in bilateral achilles, patella, biceps, BR and triceps. Babinsky: flexor responses b/l.   Hoffmans: negative b/l Sensory exam: revealed normal sensation in all dermatomal regions in bilateral upper extremities and bilateral lower extremities Motor exam: strength 5/5 throughout bilateral upper extremities, right lower extremity, and with exception of left lower extremity 1/5 hip flexion, 2/5 knee extension, 4/5 dorsiflexion and plantarflexion Coordination: Fine motor  coordination was normal.         Lab Results Last 48 Hours        Results for orders placed or performed during the hospital encounter of 10/31/23 (from the past 48 hours)  CBC with Differential/Platelet     Status: Abnormal    Collection Time: 11/03/23  3:24 AM  Result Value Ref Range    WBC 13.0 (H) 4.0 - 10.5 K/uL    RBC 4.31 4.22 - 5.81 MIL/uL    Hemoglobin 13.4 13.0 - 17.0 g/dL    HCT 59.4 60.9 - 47.9 %    MCV 94.0 80.0 - 100.0 fL    MCH 31.1 26.0 - 34.0 pg    MCHC 33.1 30.0 - 36.0 g/dL    RDW 87.4 88.4 - 84.4 %    Platelets 271 150 - 400 K/uL  nRBC 0.0 0.0 - 0.2 %    Neutrophils Relative % 80 %    Neutro Abs 10.3 (H) 1.7 - 7.7 K/uL    Lymphocytes Relative 5 %    Lymphs Abs 0.7 0.7 - 4.0 K/uL    Monocytes Relative 14 %    Monocytes Absolute 1.8 (H) 0.1 - 1.0 K/uL    Eosinophils Relative 1 %    Eosinophils Absolute 0.1 0.0 - 0.5 K/uL    Basophils Relative 0 %    Basophils Absolute 0.0 0.0 - 0.1 K/uL    Immature Granulocytes 0 %    Abs Immature Granulocytes 0.05 0.00 - 0.07 K/uL      Comment: Performed at [facility], [address] [place], [place] [address]  Basic metabolic panel with GFR     Status: Abnormal    Collection Time: 11/03/23  3:24 AM  Result Value Ref Range    Sodium 137 135 - 145 mmol/L    Potassium 3.4 (L) 3.5 - 5.1 mmol/L    Chloride 104 98 - 111 mmol/L    CO2 25 22 - 32 mmol/L    Glucose, Bld 103 (H) 70 - 99 mg/dL      Comment: Glucose reference range applies only to samples taken after fasting for at least 8 hours.    BUN 18 8 - 23 mg/dL    Creatinine, Ser 9.00 0.61 - 1.24 mg/dL    Calcium  8.7 (L) 8.9 - 10.3 mg/dL    GFR, Estimated >39 >39 mL/min      Comment: (NOTE) Calculated using the CKD-EPI Creatinine Equation (2021)      Anion gap 8 5 - 15      Comment: Performed at [facility], [address] [place], [place] [address]  CBC with Differential/Platelet     Status: Abnormal    Collection Time: 11/04/23  3:25 AM  Result  Value Ref Range    WBC 10.6 (H) 4.0 - 10.5 K/uL    RBC 3.58 (L) 4.22 - 5.81 MIL/uL    Hemoglobin 11.1 (L) 13.0 - 17.0 g/dL    HCT 66.0 (L) 60.9 - 52.0 %    MCV 94.7 80.0 - 100.0 fL    MCH 31.0 26.0 - 34.0 pg    MCHC 32.7 30.0 - 36.0 g/dL    RDW 87.4 88.4 - 84.4 %    Platelets 242 150 - 400 K/uL    nRBC 0.0 0.0 - 0.2 %    Neutrophils Relative % 70 %    Neutro Abs 7.2 1.7 - 7.7 K/uL    Lymphocytes Relative 9 %    Lymphs Abs 1.0 0.7 - 4.0 K/uL    Monocytes Relative 17 %    Monocytes Absolute 1.8 (H) 0.1 - 1.0 K/uL    Eosinophils Relative 4 %    Eosinophils Absolute 0.5 0.0 - 0.5 K/uL    Basophils Relative 0 %    Basophils Absolute 0.0 0.0 - 0.1 K/uL    Immature Granulocytes 0 %    Abs Immature Granulocytes 0.04 0.00 - 0.07 K/uL      Comment: Performed at [facility], [address], [place], [place] [address]  Basic metabolic panel with GFR     Status: Abnormal    Collection Time: 11/04/23  3:25 AM  Result Value Ref Range    Sodium 137 135 - 145 mmol/L    Potassium 3.6 3.5 - 5.1 mmol/L    Chloride 104 98 -  111 mmol/L    CO2 26 22 - 32 mmol/L    Glucose, Bld 89 70 - 99 mg/dL      Comment: Glucose reference range applies only to samples taken after fasting for at least 8 hours.    BUN 19 8 - 23 mg/dL    Creatinine, Ser 9.20 0.61 - 1.24 mg/dL    Calcium  8.3 (L) 8.9 - 10.3 mg/dL    GFR, Estimated >39 >39 mL/min      Comment: (NOTE) Calculated using the CKD-EPI Creatinine Equation (2021)      Anion gap 7 5 - 15      Comment: Performed at [facility], [address] [place], [place] [address]      Imaging Results (Last 48 hours)  No results found.         Blood pressure 125/67, pulse 85, temperature 98.1 F (36.7 C), temperature source Oral, resp. rate 18, height 5' 7 (1.702 m), weight 64.4 kg, SpO2 94%.   Medical Problem List and Plan: 1. Functional deficits secondary to left three-part intertrochanteric hip fracture with subsequent IM nailing 11/01/2023.   Weightbearing as tolerated             -patient may shower with incisions covered             -ELOS/Goals: 10 to 14 days, supervision PT/OT  - Able for inpatient rehab admission 2.  Antithrombotics: -DVT/anticoagulation:  Mechanical: Antiembolism stockings, thigh (TED hose) Bilateral lower extremities.  Check vascular study             -antiplatelet therapy: Plavix  75 mg daily 3. Pain Management: Robaxin  as needed, discontinue IM Toradol  and begin tramadol 4. Mood/Behavior/Sleep: Provide emotional support             -antipsychotic agents: Seroquel  25 mg nightly 5. Neuropsych/cognition: This patient is not capable of making decisions on his own behalf. 6. Skin/Wound Care: Routine skin checks 7. Fluids/Electrolytes/Nutrition: Routine in and outs with follow-up chemistries 8.  History of right thalamic infarction.  Received CIR.  Continue Plavix  9.  BPH.  Flomax  0.4 mg daily check PVRs 10.  Hypertension.  Monitor with increased mobility.  Benicar currently on hold due to some soft blood pressures and resume as needed 11.  Crestor  20 mg daily       Toribio JINNY Pitch, PA-C 11/04/2023

## 2023-11-05 NOTE — Discharge Summary (Signed)
 Physician Discharge Summary  Patient ID: Samuel Mata MRN: [id number] DOB/AGE: June 21, 1930 89 y.o.  Admit date: 11/05/2023 Discharge date: 11/23/2023  Discharge Diagnoses:  Principal Problem:   Intertrochanteric fracture of left hip [facility]) Active Problems:   Coping style affecting medical condition DVT prophylaxis Mood stabilization History of right thalamic infarction BPH Hypertension Hyperlipidemia Pulmonary nocardiosis  Discharged Condition: Stable  Significant Diagnostic Studies: DG HIP UNILAT WITH PELVIS 2-3 VIEWS LEFT Result Date: 11/14/2023 CLINICAL DATA:  Acute postoperative pain of left hip. EXAM: DG HIP (WITH OR WITHOUT PELVIS) 2-3V LEFT COMPARISON:  11/01/2023. FINDINGS: There is redemonstration of a comminuted intra cogent Tariq fracture of the left hip with placement of fixation hardware. Alignment appears stable. There is no dislocation bilaterally. Vascular calcifications are noted in the soft tissues. Soft tissue swelling is noted over the left lateral hip. IMPRESSION: Comminuted intertrochanteric fracture of the left hip with fixation hardware in place. No obvious postoperative abnormality is seen. Electronically Signed   By: Leita Birmingham M.D.   On: 11/14/2023 20:07   DG CHEST PORT 1 VIEW Result Date: 11/04/2023 CLINICAL DATA:  Hypoxia EXAM: PORTABLE CHEST 1 VIEW COMPARISON:  Chest x-ray 10/31/2023 FINDINGS: Loop recorder device overlies the left chest. Cardiomediastinal silhouette is within normal limits. Multifocal reticular opacities and mild patchy right upper lobe opacities appear unchanged from the prior study. There is no pleural effusion or pneumothorax. IMPRESSION: 1. Chronic appearing lung disease. Patchy right upper lobe opacities may represent acute infection. Electronically Signed   By: Greig Pique M.D.   On: 11/04/2023 17:13   DG FEMUR PORT MIN 2 VIEWS LEFT Result Date: 11/01/2023 CLINICAL DATA:  Fracture, postop. EXAM: LEFT FEMUR PORTABLE 2 VIEWS  COMPARISON:  Operative imaging FINDINGS: Femoral intramedullary nail with trans trochanteric and distal locking screw fixation traversing proximal femur fracture. Improved fracture alignment from preoperative imaging. Recent postsurgical change includes air and edema in the soft tissues. IMPRESSION: ORIF of proximal femur fracture without immediate postoperative complication. Electronically Signed   By: Andrea Gasman M.D.   On: 11/01/2023 18:03   DG FEMUR MIN 2 VIEWS LEFT Result Date: 11/01/2023 CLINICAL DATA:  [id number] Surgery, elective [id number] EXAM: LEFT FEMUR 2 VIEWS COMPARISON:  Preoperative radiograph FINDINGS: Eight fluoroscopic spot views of the left femur submitted from the operating room. Femoral intramedullary nail with trans trochanteric and distal locking screw fixation traverse proximal femur fracture. Fluoroscopy time 1 minutes 14 seconds. Dose 14.72 mGy. IMPRESSION: Intraoperative fluoroscopy during proximal femur fracture fixation. Electronically Signed   By: Andrea Gasman M.D.   On: 11/01/2023 18:02   DG C-Arm 1-60 Min-No Report Result Date: 11/01/2023 Fluoroscopy was utilized by the requesting physician.  No radiographic interpretation.   DG C-Arm 1-60 Min-No Report Result Date: 11/01/2023 Fluoroscopy was utilized by the requesting physician.  No radiographic interpretation.   DG Knee Left Port Result Date: 10/31/2023 CLINICAL DATA:  Status post fall. EXAM: PORTABLE LEFT KNEE - 1-2 VIEW COMPARISON:  None Available. FINDINGS: No evidence of an acute fracture, dislocation, or joint effusion. Mild to moderate severity tricompartmental degenerative changes are seen. There is marked severity vascular calcification. Soft tissues are otherwise unremarkable. IMPRESSION: Degenerative changes without evidence of an acute osseous abnormality. Electronically Signed   By: Suzen Dials M.D.   On: 10/31/2023 18:50   CT HEAD WO CONTRAST Result Date: 10/31/2023 CLINICAL DATA:  Head trauma  EXAM: CT HEAD WITHOUT CONTRAST CT CERVICAL SPINE WITHOUT CONTRAST TECHNIQUE: Multidetector CT imaging of the head and cervical spine was performed following  the standard protocol without intravenous contrast. Multiplanar CT image reconstructions of the cervical spine were also generated. RADIATION DOSE REDUCTION: This exam was performed according to the departmental dose-optimization program which includes automated exposure control, adjustment of the mA and/or kV according to patient size and/or use of iterative reconstruction technique. COMPARISON:  CT brain 04/11/2023 FINDINGS: CT HEAD FINDINGS Brain: No acute territorial infarction, hemorrhage, or intracranial mass. Chronic cerebellar and right thalamic infarcts. Atrophy and mild chronic small vessel ischemic changes of the white matter. Stable ventricle size Vascular: No hyperdense vessels.  Carotid vascular calcification Skull: Normal. Negative for fracture or focal lesion. Sinuses/Orbits: No acute finding. Other: None CT CERVICAL SPINE FINDINGS Alignment: No subluxation.  Facet alignment is within normal limits. Skull base and vertebrae: No acute fracture. No primary bone lesion or focal pathologic process. Soft tissues and spinal canal: No prevertebral fluid or swelling. No visible canal hematoma. Disc levels: Advanced disc space narrowing and degenerative change C5-C6 and C6-C7 with moderate disc space narrowing at C7-T1. Multilevel facet degenerative changes. Upper chest: Apical scarring Other: None IMPRESSION: 1. No CT evidence for acute intracranial abnormality. Atrophy and chronic small vessel ischemic changes of the white matter. Chronic cerebellar and right thalamic infarcts. 2. Degenerative changes of the cervical spine. No acute osseous abnormality. Electronically Signed   By: Luke Bun M.D.   On: 10/31/2023 16:02   CT CERVICAL SPINE WO CONTRAST Result Date: 10/31/2023 CLINICAL DATA:  Head trauma EXAM: CT HEAD WITHOUT CONTRAST CT CERVICAL  SPINE WITHOUT CONTRAST TECHNIQUE: Multidetector CT imaging of the head and cervical spine was performed following the standard protocol without intravenous contrast. Multiplanar CT image reconstructions of the cervical spine were also generated. RADIATION DOSE REDUCTION: This exam was performed according to the departmental dose-optimization program which includes automated exposure control, adjustment of the mA and/or kV according to patient size and/or use of iterative reconstruction technique. COMPARISON:  CT brain 04/11/2023 FINDINGS: CT HEAD FINDINGS Brain: No acute territorial infarction, hemorrhage, or intracranial mass. Chronic cerebellar and right thalamic infarcts. Atrophy and mild chronic small vessel ischemic changes of the white matter. Stable ventricle size Vascular: No hyperdense vessels.  Carotid vascular calcification Skull: Normal. Negative for fracture or focal lesion. Sinuses/Orbits: No acute finding. Other: None CT CERVICAL SPINE FINDINGS Alignment: No subluxation.  Facet alignment is within normal limits. Skull base and vertebrae: No acute fracture. No primary bone lesion or focal pathologic process. Soft tissues and spinal canal: No prevertebral fluid or swelling. No visible canal hematoma. Disc levels: Advanced disc space narrowing and degenerative change C5-C6 and C6-C7 with moderate disc space narrowing at C7-T1. Multilevel facet degenerative changes. Upper chest: Apical scarring Other: None IMPRESSION: 1. No CT evidence for acute intracranial abnormality. Atrophy and chronic small vessel ischemic changes of the white matter. Chronic cerebellar and right thalamic infarcts. 2. Degenerative changes of the cervical spine. No acute osseous abnormality. Electronically Signed   By: Luke Bun M.D.   On: 10/31/2023 16:02   DG Chest Port 1 View Result Date: 10/31/2023 CLINICAL DATA:  Trauma patient.  Left hip pain after falling. EXAM: PORTABLE CHEST 1 VIEW COMPARISON:  Radiographs 05/01/2023  and 04/08/2023.  CT 09/17/2019. FINDINGS: 1513 hours. The heart size and mediastinal contours are stable with mild cardiomegaly and aortic atherosclerosis. There is chronic lung disease with architectural distortion, subpleural reticulation and scattered scarring, similar to prior imaging. No superimposed airspace disease, pleural effusion or pneumothorax. Loop recorder overlies the lower left chest. Patient is status post lower thoracic  spinal augmentation. IMPRESSION: Grossly stable chronic lung disease without evidence of acute cardiopulmonary process. Electronically Signed   By: Elsie Perone M.D.   On: 10/31/2023 15:35   DG Hip Port Unilat W or Wo Pelvis 1 View Left Result Date: 10/31/2023 CLINICAL DATA:  Post fall and trauma EXAM: DG HIP (WITH OR WITHOUT PELVIS) 1V PORT LEFT COMPARISON:  None Available. FINDINGS: Left hip intertrochanteric fracture with butterfly fragment of the lesser trochanter IMPRESSION: Left hip fracture Electronically Signed   By: Franky Chard M.D.   On: 10/31/2023 15:32   DG Pelvis Portable Result Date: 10/31/2023 CLINICAL DATA:  Status post fall EXAM: PORTABLE PELVIS 1-2 VIEWS COMPARISON:  None Available. FINDINGS: Left intertrochanteric fracture with avulsion fracture of the lesser trochanter with minimal varus deformity IMPRESSION: Left hip fracture Electronically Signed   By: Franky Chard M.D.   On: 10/31/2023 15:32   CUP PACEART REMOTE DEVICE CHECK Result Date: 10/27/2023 ILR summary report received. Battery status OK. Normal device function. No new symptom, tachy, brady, or pause episodes. No new AF episodes. Monthly summary reports and ROV/PRN AB, CVRS   Labs:  Basic Metabolic Panel: No results for input(s): NA, K, CL, CO2, GLUCOSE, BUN, CREATININE, CALCIUM , MG, PHOS in the last 168 hours.   CBC: No results for input(s): WBC, NEUTROABS, HGB, HCT, MCV, PLT in the last 168 hours.   CBG: No results for input(s): GLUCAP in  the last 168 hours.  Family history.  Negative for colon cancer or esophageal cancer or rectal cancer  Brief HPI:   Samuel Mata is a 89 y.o. right-handed male with history significant for BPH hypertension pulmonary cocardiosis treated with 6 months of antibiotic course, quit smoking 39 years ago, CVA right thalamic a loop recorder insertion received CIR Levan 20 11/04/2022 - 04/29/2023 maintained on Plavix .  Per chart review lives with spouse.  1 level home.  Independent without assistive device but did have some balance impairments per daughter.  He did have an aide helping with the shower 1 day a week wife completes other ADLs.  Presented 10/31/2023 after a fall in the kitchen when he lost his balance.  Denied loss of consciousness.  He experienced immediate left hip pain was brought to the emergency room.  Imaging of the pelvis demonstrated three-part intertrochanteric hip fracture.  CT cervical spine did not show any acute abnormalities.  CTA and CT of the head did not demonstrate any acute intracranial abnormality or large vessel occlusion.  Chronic cerebellar and right thalamic infarcts were however seen.  On 11/01/2023 patient underwent intramedullary nailing of the left hip per Dr. Celena.  Postoperative Plavix  for history of CVA resumed.  Postoperative noted some confusion maintained on Seroquel  as well mild hypokalemia improved to 3.6 leukocytosis 13,000 improved to 10,600.  He did spike a low-grade fever 99.2 and monitored.  He is weightbearing as tolerated left lower extremity.  Therapy evaluations completed due to patient decreased functional mobility was admitted for a comprehensive rehab program.   Hospital Course: Sasuke Yaffe was admitted to rehab 11/05/2023 for inpatient therapies to consist of PT, ST and OT at least three hours five days a week. Past admission physiatrist, therapy team and rehab RN have worked together to provide customized collaborative inpatient rehab.  Pertaining to  patient's left three-part intertrochanteric hip fracture subsequent IM nailing 11/01/2023.  Surgical site clean and dry weightbearing as tolerated neurovascular sensation intact patient would follow-up orthopedic services.  DVT prophylaxis with SCDs venous Doppler studies negative he remained  on Plavix  for history of right thalamic infarction he did receive CIR in the past for CVA.  History of BPH Flomax  as indicated monitoring of PVRs no dysuria or hematuria.  Blood pressure monitored and remained soft with Benicar held could be resumed as needed follow-up outpatient.  Crestor  ongoing for hyperlipidemia   Blood pressures were monitored on TID basis and remained soft and monitored     Rehab course: During patient's stay in rehab weekly team conferences were held to monitor patient's progress, set goals and discuss barriers to discharge. At admission, patient required moderate assist 18 feet rolling walker min mod assist sit to stand  He/She  has had improvement in activity tolerance, balance, postural control as well as ability to compensate for deficits. He/She has had improvement in functional use RUE/LUE  and RLE/LLE as well as improvement in awareness.  Perform supine to sit with contact-guard for left lower extremity.  He does require some increased effort and extra time to complete tasks.  Perform sit to stand and stand pivot transfers throughout sessions with close supervision.  Ambulatory transfer from bed to sink with rolling walker and contact-guard.  Patient stood at sink and brush teeth brushed hair and wash face with supervision/contact-guard.  Gait training using rolling walker minimal assist ambulating 70 feet.  Patient able to doff don pants in standing position with contact-guard.  Completed PERI care in seated position.  Completed dynamic standing balance functional reaching activity to work on standing tolerance dynamic balance and improve overall confidence in standing position without  bilateral extremity support to improve independence in ADLs.  Plan was discharged to [facility] assisted living facility       Disposition:  There are no questions and answers to display.         Diet: Regular  Special Instructions: No driving smoking or alcohol   Weightbearing as tolerated left lower extremity  Medications at discharge. 1.  Tylenol  as needed 2.  Plavix  75 mg p.o. daily 3.  Colace 100 mg twice daily hold for loose stools 4.  Pepcid  20 mg p.o. daily 5.  MiraLAX  daily hold for loose stools 6.  Multivitamin daily 7.  Crestor  20 mg p.o. daily 8.  Flomax  0.4 mg daily after supper 9.  Tramadol  50 mg every 6 hours as needed pain 10.  Vitamin D  50,000 units every 7 days 11.  Melatonin 3 mg nightly 14.  Nitroglycerin  as needed chest pain 15.  Artificial tears 2 drops right eye 4 times daily 16.  Flonase  2 sprays into both nostrils daily as needed  30-35 minutes were spent completing discharge summary and discharge planning     Follow-up Information     Kirsteins, Prentice BRAVO, MD Follow up.   Specialty: Physical Medicine and Rehabilitation Why: No formal follow-up needed Contact information: [address] [place] [address] [phone]         Celena Sharper, MD Follow up.   Specialty: Orthopedic Surgery Why: Call for appointment Contact information: [address] [place] [address] [phone]                 Signed: Toribio [name] Hamna Asa 11/21/2023, 5:24 AM

## 2023-11-05 NOTE — Discharge Instructions (Addendum)
 Inpatient Rehab Discharge Instructions  Anthonyjames Bargar Discharge date and time: No discharge date for patient encounter.   Activities/Precautions/ Functional Status: Activity: As tolerated Diet: Regular Wound Care: Routine skin checks Functional status:  ___ No restrictions     ___ Walk up steps independently ___ 24/7 supervision/assistance   ___ Walk up steps with assistance ___ Intermittent supervision/assistance  ___ Bathe/dress independently ___ Walk with walker     _x__ Bathe/dress with assistance ___ Walk Independently    ___ Shower independently ___ Walk with assistance    ___ Shower with assistance ___ No alcohol      ___ Return to work/school ________  Special Instructions: No driving smoking or alcohol    COMMUNITY REFERRALS UPON DISCHARGE:    Home Health:   PT   &    OT                  Agency:[facility]   Phone:[phone]     Medical Equipment/Items Ordered:HOSPITAL BED AND WHEELCHAIR                                                 Agency/Supplier:[facility]   [phone]    My questions have been answered and I understand these instructions. I will adhere to these goals and the provided educational materials after my discharge from the hospital.  Patient/Caregiver Signature _______________________________ Date __________  Clinician Signature _______________________________________ Date __________  Please bring this form and your medication list with you to all your follow-up doctor's appointments.

## 2023-11-05 NOTE — Plan of Care (Signed)
 Patient had BM on night shift.

## 2023-11-05 NOTE — Discharge Summary (Signed)
 Physician Discharge Summary   Patient: Samuel Mata MRN: [id number] DOB: 1930/11/13  Admit date:     10/31/2023  Discharge date: 11/05/23  Discharge Physician: Deliliah Room   PCP: Donata Snowman, PA-C   Recommendations at discharge:    PT as recommended Out patient follow up PCP and Orthopedics  Discharge Diagnoses: Principal Problem:   Fall at home, initial encounter Active Problems:   Malnutrition of moderate degree  Resolved Problems:   * No resolved hospital problems. [facility] Course:   Left acute intertrochanteric fracture: s/p INTRAMEDULLARY NAILING OF THE LEFT HIP using a statically locked [employer] Affixus nail 11 X 400. POD 4 Osteoporotic pathological fracture of Left intertrochanteric    Secondary to fall from loss of balance Imaging as above. Orthopedics on board. Follow up in 2 weeks in clinic.   Pain regimen: prn analgesics, avoiding opioids  Bowel regimen: Scheduled Colace, as needed MiraLAX  DVT prophylaxis: Resumed plavix    Hospital acquired delirium: Resolved now. On the night of 6/18. He developed agitation and was given IV ativan  and PO seroquel . Avoid sedatives/BZDs. Frequent reorientation Avoid restraints Family at bedside.   Chronic gait impairment At baseline, supposed to use walker but prefers not to,per family.   PT OT eval   H/o stroke, HLD Continue with plavix  and statin   Essential hypertension No need for scheduled antihypertensives at this time based on his age and high risk of fall if hypotensive. Check BP daily.   COPD As needed bronchodilators   h/o hereditary spherocytosis  s/p prior splenectomy   GERD Pepcid    BPH Flomax  0.4 mg daily    Mobility: PT OT on board   Goals of care   Code Status: Full Code    Disposition: CIR        Consultants: Orthopedics Procedures performed: Hip surgery  Disposition: Rehabilitation facility Diet recommendation:  Discharge Diet Orders (From admission, onward)      Start     Ordered   11/05/23 0000  Diet - low sodium heart healthy        11/05/23 1131           Cardiac diet DISCHARGE MEDICATION: Allergies as of 11/05/2023       Reactions   Codeine    Unknown reaction        Medication List     STOP taking these medications    amLODipine  5 MG tablet Commonly known as: NORVASC    olmesartan 5 MG tablet Commonly known as: BENICAR       TAKE these medications    acetaminophen  325 MG tablet Commonly known as: TYLENOL  Take 650 mg by mouth as needed for headache.   alum & mag hydroxide-simeth 200-200-20 MG/5ML suspension Commonly known as: MAALOX/MYLANTA Take 15 mLs by mouth every 4 (four) hours as needed for indigestion, heartburn or flatulence.   artificial tears ophthalmic solution Place 2 drops into the right eye 4 (four) times daily. What changed:  when to take this reasons to take this   bisacodyl  5 MG EC tablet Commonly known as: DULCOLAX Take 1 tablet (5 mg total) by mouth daily as needed for moderate constipation.   CLEAR EYES OP Place 1 drop into both eyes daily as needed (for eye redness).   clopidogrel  75 MG tablet Commonly known as: PLAVIX  Take 1 tablet (75 mg total) by mouth daily.   famotidine  20 MG tablet Commonly known as: PEPCID  Take 1 tablet (20 mg total) by mouth daily. What changed: when to take this  feeding supplement Liqd Take 237 mLs by mouth 2 (two) times daily between meals.   fluticasone  50 MCG/ACT nasal spray Commonly known as: FLONASE  Place 2 sprays into both nostrils daily as needed for allergies.   ipratropium-albuterol  0.5-2.5 (3) MG/3ML Soln Commonly known as: DUONEB Take 3 mLs by nebulization every 6 (six) hours as needed.   menthol -cetylpyridinium 3 MG lozenge Commonly known as: CEPACOL Take 1 lozenge (3 mg total) by mouth as needed for sore throat.   methocarbamol  500 MG tablet Commonly known as: ROBAXIN  Take 1 tablet (500 mg total) by mouth every 6 (six) hours as  needed for muscle spasms.   multivitamin with minerals Tabs tablet Take 1 tablet by mouth daily. Start taking on: November 06, 2023   nitroGLYCERIN  0.4 MG SL tablet Commonly known as: NITROSTAT  Place 1 tablet (0.4 mg total) under the tongue every 5 (five) minutes as needed for chest pain.   ondansetron  4 MG tablet Commonly known as: ZOFRAN  Take 1 tablet (4 mg total) by mouth every 6 (six) hours as needed for nausea.   oxyCODONE  5 MG immediate release tablet Commonly known as: Roxicodone  Take 1 tablet (5 mg total) by mouth every 6 (six) hours as needed for severe pain (pain score 7-10).   rosuvastatin  20 MG tablet Commonly known as: CRESTOR  Take 1 tablet (20 mg total) by mouth daily with supper.   sodium chloride  0.65 % nasal spray Commonly known as: OCEAN Place 1 spray into the nose as needed for congestion.   tamsulosin  0.4 MG Caps capsule Commonly known as: FLOMAX  Take 1 capsule (0.4 mg total) by mouth daily. What changed: when to take this   Vitamin D  (Ergocalciferol ) 1.25 MG (50000 UNIT) Caps capsule Commonly known as: DRISDOL  Take 1 capsule (50,000 Units total) by mouth every 7 (seven) days. Start taking on: November 11, 2023               Discharge Care Instructions  (From admission, onward)           Start     Ordered   11/05/23 0000  Leave dressing on - Keep it clean, dry, and intact until clinic visit        11/05/23 1131            Discharge Exam: Filed Weights   10/31/23 1459 11/01/23 1310  Weight: 64.4 kg 64.4 kg   Alert and awake  Condition at discharge: good  The results of significant diagnostics from this hospitalization (including imaging, microbiology, ancillary and laboratory) are listed below for reference.   Imaging Studies: DG CHEST PORT 1 VIEW Result Date: 11/04/2023 CLINICAL DATA:  Hypoxia EXAM: PORTABLE CHEST 1 VIEW COMPARISON:  Chest x-ray 10/31/2023 FINDINGS: Loop recorder device overlies the left chest. Cardiomediastinal  silhouette is within normal limits. Multifocal reticular opacities and mild patchy right upper lobe opacities appear unchanged from the prior study. There is no pleural effusion or pneumothorax. IMPRESSION: 1. Chronic appearing lung disease. Patchy right upper lobe opacities may represent acute infection. Electronically Signed   By: Greig Pique M.D.   On: 11/04/2023 17:13   DG FEMUR PORT MIN 2 VIEWS LEFT Result Date: 11/01/2023 CLINICAL DATA:  Fracture, postop. EXAM: LEFT FEMUR PORTABLE 2 VIEWS COMPARISON:  Operative imaging FINDINGS: Femoral intramedullary nail with trans trochanteric and distal locking screw fixation traversing proximal femur fracture. Improved fracture alignment from preoperative imaging. Recent postsurgical change includes air and edema in the soft tissues. IMPRESSION: ORIF of proximal femur fracture without immediate postoperative  complication. Electronically Signed   By: Andrea Gasman M.D.   On: 11/01/2023 18:03   DG FEMUR MIN 2 VIEWS LEFT Result Date: 11/01/2023 CLINICAL DATA:  [id number] Surgery, elective [id number] EXAM: LEFT FEMUR 2 VIEWS COMPARISON:  Preoperative radiograph FINDINGS: Eight fluoroscopic spot views of the left femur submitted from the operating room. Femoral intramedullary nail with trans trochanteric and distal locking screw fixation traverse proximal femur fracture. Fluoroscopy time 1 minutes 14 seconds. Dose 14.72 mGy. IMPRESSION: Intraoperative fluoroscopy during proximal femur fracture fixation. Electronically Signed   By: Andrea Gasman M.D.   On: 11/01/2023 18:02   DG C-Arm 1-60 Min-No Report Result Date: 11/01/2023 Fluoroscopy was utilized by the requesting physician.  No radiographic interpretation.   DG C-Arm 1-60 Min-No Report Result Date: 11/01/2023 Fluoroscopy was utilized by the requesting physician.  No radiographic interpretation.   DG Knee Left Port Result Date: 10/31/2023 CLINICAL DATA:  Status post fall. EXAM: PORTABLE LEFT KNEE - 1-2 VIEW  COMPARISON:  None Available. FINDINGS: No evidence of an acute fracture, dislocation, or joint effusion. Mild to moderate severity tricompartmental degenerative changes are seen. There is marked severity vascular calcification. Soft tissues are otherwise unremarkable. IMPRESSION: Degenerative changes without evidence of an acute osseous abnormality. Electronically Signed   By: Suzen Dials M.D.   On: 10/31/2023 18:50   CT HEAD WO CONTRAST Result Date: 10/31/2023 CLINICAL DATA:  Head trauma EXAM: CT HEAD WITHOUT CONTRAST CT CERVICAL SPINE WITHOUT CONTRAST TECHNIQUE: Multidetector CT imaging of the head and cervical spine was performed following the standard protocol without intravenous contrast. Multiplanar CT image reconstructions of the cervical spine were also generated. RADIATION DOSE REDUCTION: This exam was performed according to the departmental dose-optimization program which includes automated exposure control, adjustment of the mA and/or kV according to patient size and/or use of iterative reconstruction technique. COMPARISON:  CT brain 04/11/2023 FINDINGS: CT HEAD FINDINGS Brain: No acute territorial infarction, hemorrhage, or intracranial mass. Chronic cerebellar and right thalamic infarcts. Atrophy and mild chronic small vessel ischemic changes of the white matter. Stable ventricle size Vascular: No hyperdense vessels.  Carotid vascular calcification Skull: Normal. Negative for fracture or focal lesion. Sinuses/Orbits: No acute finding. Other: None CT CERVICAL SPINE FINDINGS Alignment: No subluxation.  Facet alignment is within normal limits. Skull base and vertebrae: No acute fracture. No primary bone lesion or focal pathologic process. Soft tissues and spinal canal: No prevertebral fluid or swelling. No visible canal hematoma. Disc levels: Advanced disc space narrowing and degenerative change C5-C6 and C6-C7 with moderate disc space narrowing at C7-T1. Multilevel facet degenerative changes.  Upper chest: Apical scarring Other: None IMPRESSION: 1. No CT evidence for acute intracranial abnormality. Atrophy and chronic small vessel ischemic changes of the white matter. Chronic cerebellar and right thalamic infarcts. 2. Degenerative changes of the cervical spine. No acute osseous abnormality. Electronically Signed   By: Luke Bun M.D.   On: 10/31/2023 16:02   CT CERVICAL SPINE WO CONTRAST Result Date: 10/31/2023 CLINICAL DATA:  Head trauma EXAM: CT HEAD WITHOUT CONTRAST CT CERVICAL SPINE WITHOUT CONTRAST TECHNIQUE: Multidetector CT imaging of the head and cervical spine was performed following the standard protocol without intravenous contrast. Multiplanar CT image reconstructions of the cervical spine were also generated. RADIATION DOSE REDUCTION: This exam was performed according to the departmental dose-optimization program which includes automated exposure control, adjustment of the mA and/or kV according to patient size and/or use of iterative reconstruction technique. COMPARISON:  CT brain 04/11/2023 FINDINGS: CT HEAD FINDINGS Brain: No acute  territorial infarction, hemorrhage, or intracranial mass. Chronic cerebellar and right thalamic infarcts. Atrophy and mild chronic small vessel ischemic changes of the white matter. Stable ventricle size Vascular: No hyperdense vessels.  Carotid vascular calcification Skull: Normal. Negative for fracture or focal lesion. Sinuses/Orbits: No acute finding. Other: None CT CERVICAL SPINE FINDINGS Alignment: No subluxation.  Facet alignment is within normal limits. Skull base and vertebrae: No acute fracture. No primary bone lesion or focal pathologic process. Soft tissues and spinal canal: No prevertebral fluid or swelling. No visible canal hematoma. Disc levels: Advanced disc space narrowing and degenerative change C5-C6 and C6-C7 with moderate disc space narrowing at C7-T1. Multilevel facet degenerative changes. Upper chest: Apical scarring Other: None  IMPRESSION: 1. No CT evidence for acute intracranial abnormality. Atrophy and chronic small vessel ischemic changes of the white matter. Chronic cerebellar and right thalamic infarcts. 2. Degenerative changes of the cervical spine. No acute osseous abnormality. Electronically Signed   By: Luke Bun M.D.   On: 10/31/2023 16:02   DG Chest Port 1 View Result Date: 10/31/2023 CLINICAL DATA:  Trauma patient.  Left hip pain after falling. EXAM: PORTABLE CHEST 1 VIEW COMPARISON:  Radiographs 05/01/2023 and 04/08/2023.  CT 09/17/2019. FINDINGS: 1513 hours. The heart size and mediastinal contours are stable with mild cardiomegaly and aortic atherosclerosis. There is chronic lung disease with architectural distortion, subpleural reticulation and scattered scarring, similar to prior imaging. No superimposed airspace disease, pleural effusion or pneumothorax. Loop recorder overlies the lower left chest. Patient is status post lower thoracic spinal augmentation. IMPRESSION: Grossly stable chronic lung disease without evidence of acute cardiopulmonary process. Electronically Signed   By: Elsie Perone M.D.   On: 10/31/2023 15:35   DG Hip Port Unilat W or Wo Pelvis 1 View Left Result Date: 10/31/2023 CLINICAL DATA:  Post fall and trauma EXAM: DG HIP (WITH OR WITHOUT PELVIS) 1V PORT LEFT COMPARISON:  None Available. FINDINGS: Left hip intertrochanteric fracture with butterfly fragment of the lesser trochanter IMPRESSION: Left hip fracture Electronically Signed   By: Franky Chard M.D.   On: 10/31/2023 15:32   DG Pelvis Portable Result Date: 10/31/2023 CLINICAL DATA:  Status post fall EXAM: PORTABLE PELVIS 1-2 VIEWS COMPARISON:  None Available. FINDINGS: Left intertrochanteric fracture with avulsion fracture of the lesser trochanter with minimal varus deformity IMPRESSION: Left hip fracture Electronically Signed   By: Franky Chard M.D.   On: 10/31/2023 15:32   CUP PACEART REMOTE DEVICE CHECK Result Date:  10/27/2023 ILR summary report received. Battery status OK. Normal device function. No new symptom, tachy, brady, or pause episodes. No new AF episodes. Monthly summary reports and ROV/PRN AB, CVRS   Microbiology: Results for orders placed or performed during the hospital encounter of 05/01/23  Resp panel by RT-PCR (RSV, Flu A&B, Covid) Anterior Nasal Swab     Status: Abnormal   Collection Time: 05/01/23  4:47 PM   Specimen: Anterior Nasal Swab  Result Value Ref Range Status   SARS Coronavirus 2 by RT PCR POSITIVE (A) NEGATIVE Final   Influenza A by PCR NEGATIVE NEGATIVE Final   Influenza B by PCR NEGATIVE NEGATIVE Final    Comment: (NOTE) The Xpert Xpress SARS-CoV-2/FLU/RSV plus assay is intended as an aid in the diagnosis of influenza from Nasopharyngeal swab specimens and should not be used as a sole basis for treatment. Nasal washings and aspirates are unacceptable for Xpert Xpress SARS-CoV-2/FLU/RSV testing.  Fact Sheet for Patients: BloggerCourse.com  Fact Sheet for Healthcare Providers: SeriousBroker.it  This test  is not yet approved or cleared by the [place]  FDA and has been authorized for detection and/or diagnosis of SARS-CoV-2 by FDA under an Emergency Use Authorization (EUA). This EUA will remain in effect (meaning this test can be used) for the duration of the COVID-19 declaration under Section 564(b)(1) of the Act, 21 U.S.C. section 360bbb-3(b)(1), unless the authorization is terminated or revoked.     Resp Syncytial Virus by PCR NEGATIVE NEGATIVE Final    Comment: (NOTE) Fact Sheet for Patients: BloggerCourse.com  Fact Sheet for Healthcare Providers: SeriousBroker.it  This test is not yet approved or cleared by the [place]  FDA and has been authorized for detection and/or diagnosis of SARS-CoV-2 by FDA under an Emergency Use Authorization (EUA). This  EUA will remain in effect (meaning this test can be used) for the duration of the COVID-19 declaration under Section 564(b)(1) of the Act, 21 U.S.C. section 360bbb-3(b)(1), unless the authorization is terminated or revoked.  Performed at [facility], [address], [place], [place] [address]     Labs: CBC: Recent Labs  Lab 10/31/23 1520 11/01/23 0820 11/02/23 0340 11/03/23 0324 11/04/23 0325  WBC 8.7 8.2 12.9* 13.0* 10.6*  NEUTROABS  --   --  10.2* 10.3* 7.2  HGB 12.9* 12.4* 11.7* 13.4 11.1*  HCT 39.8 37.7* 36.1* 40.5 33.9*  MCV 95.9 94.5 95.3 94.0 94.7  PLT 322 279 260 271 242   Basic Metabolic Panel: Recent Labs  Lab 10/31/23 1520 11/01/23 0820 11/02/23 0340 11/03/23 0324 11/04/23 0325  NA 140 136 135 137 137  K 4.0 4.1 4.7 3.4* 3.6  CL 108 104 102 104 104  CO2 26 24 25 25 26   GLUCOSE 109* 124* 123* 103* 89  BUN 17 19 24* 18 19  CREATININE 1.01 0.90 1.15 0.99 0.79  CALCIUM  8.9 8.7* 8.5* 8.7* 8.3*   Liver Function Tests: Recent Labs  Lab 10/31/23 1520  AST 20  ALT 14  ALKPHOS 46  BILITOT 0.6  PROT 6.7  ALBUMIN 2.9*   CBG: No results for input(s): GLUCAP in the last 168 hours.  Discharge time spent: greater than 30 minutes.  Signed: Deliliah Room, MD [facility] 11/05/2023

## 2023-11-05 NOTE — Progress Notes (Signed)
 Inpatient Rehabilitation Admission Medication Review by a Pharmacist  A complete drug regimen review was completed for this patient to identify any potential clinically significant medication issues.  High Risk Drug Classes Is patient taking? Indication by Medication  Antipsychotic Yes Seroquel  (quetiapine ) - Sleep  Anticoagulant No   Antibiotic No   Opioid Yes Tramadol PRN - Pain  Antiplatelet Yes Plavix  - ASCVD prevention  Hypoglycemics/insulin No   Vasoactive Medication Yes Tamsulosin  - BPH  Chemotherapy No   Other Yes Colace - Constipation Pepcid  - GERD Vitamin D  - vitamin D  supplementation Methocarbamol  PRN - muscle spasm/pain Rosuvastatin  - ASCVD prevention     Type of Medication Issue Identified Description of Issue Recommendation(s)  Drug Interaction(s) (clinically significant)  Non identified   Duplicate Therapy  Non identified   Allergy  Non identified   No Medication Administration End Date  Non identified   Incorrect Dose  Non identified   Additional Drug Therapy Needed  Non identified   Significant med changes from prior encounter (inform family/care partners about these prior to discharge). Held at inpatient discharge: amlodipine , olmesartan  Resume as clinically appropriate. If held at [facility] discharge, inform patient and/or caregiver.  Other       Clinically significant medication issues were identified that warrant physician communication and completion of prescribed/recommended actions by midnight of the next day:  No   Pharmacist comments: N/a  Time spent performing this drug regimen review (minutes):  20  Con Laughter, PharmD 11/05/2023 12:40 PM

## 2023-11-06 DIAGNOSIS — D649 Anemia, unspecified: Secondary | ICD-10-CM

## 2023-11-06 DIAGNOSIS — S72142A Displaced intertrochanteric fracture of left femur, initial encounter for closed fracture: Secondary | ICD-10-CM

## 2023-11-06 DIAGNOSIS — E559 Vitamin D deficiency, unspecified: Secondary | ICD-10-CM

## 2023-11-06 DIAGNOSIS — J449 Chronic obstructive pulmonary disease, unspecified: Secondary | ICD-10-CM

## 2023-11-06 DIAGNOSIS — G4700 Insomnia, unspecified: Secondary | ICD-10-CM

## 2023-11-06 DIAGNOSIS — K5901 Slow transit constipation: Secondary | ICD-10-CM

## 2023-11-06 DIAGNOSIS — I10 Essential (primary) hypertension: Secondary | ICD-10-CM

## 2023-11-06 DIAGNOSIS — R8290 Unspecified abnormal findings in urine: Secondary | ICD-10-CM

## 2023-11-06 LAB — URINALYSIS, W/ REFLEX TO CULTURE (INFECTION SUSPECTED)
Bacteria, UA: NONE SEEN
Bilirubin Urine: NEGATIVE
Glucose, UA: NEGATIVE mg/dL
Hgb urine dipstick: NEGATIVE
Ketones, ur: NEGATIVE mg/dL
Leukocytes,Ua: NEGATIVE
Nitrite: NEGATIVE
Protein, ur: NEGATIVE mg/dL
Specific Gravity, Urine: 1.019 (ref 1.005–1.030)
pH: 6 (ref 5.0–8.0)

## 2023-11-06 MED ORDER — QUETIAPINE FUMARATE 25 MG PO TABS: 25.0000 mg | ORAL_TABLET | Freq: Every evening | ORAL | Status: DC | PRN

## 2023-11-06 MED ORDER — MELATONIN 3 MG PO TABS
3.0000 mg | ORAL_TABLET | Freq: Every day | ORAL | Status: DC
  Administered 2023-11-06 – 2023-11-22 (×17): 3 mg via ORAL
  Filled 2023-11-06 (×17): qty 1

## 2023-11-06 NOTE — Plan of Care (Signed)
  Problem: Sit to Stand Goal: LTG:  Patient will perform sit to stand with assistance level (PT) Description: LTG:  Patient will perform sit to stand with assistance level (PT) Flowsheets (Taken 11/06/2023 1321) LTG: PT will perform sit to stand in preparation for functional mobility with assistance level: Supervision/Verbal cueing   Problem: RH Bed Mobility Goal: LTG Patient will perform bed mobility with assist (PT) Description: LTG: Patient will perform bed mobility with assistance, with/without cues (PT). Flowsheets (Taken 11/06/2023 1321) LTG: Pt will perform bed mobility with assistance level of: Independent   Problem: RH Bed to Chair Transfers Goal: LTG Patient will perform bed/chair transfers w/assist (PT) Description: LTG: Patient will perform bed to chair transfers with assistance (PT). Flowsheets (Taken 11/06/2023 1321) LTG: Pt will perform Bed to Chair Transfers with assistance level: Supervision/Verbal cueing   Problem: RH Ambulation Goal: LTG Patient will ambulate in controlled environment (PT) Description: LTG: Patient will ambulate in a controlled environment, # of feet with assistance (PT). Flowsheets (Taken 11/06/2023 1321) LTG: Pt will ambulate in controlled environ  assist needed:: Contact Guard/Touching assist LTG: Ambulation distance in controlled environment: 150 feet   Problem: RH Stairs Goal: LTG Patient will ambulate up and down stairs w/assist (PT) Description: LTG: Patient will ambulate up and down # of stairs with assistance (PT) Flowsheets (Taken 11/06/2023 1321) LTG: Pt will ambulate up/down stairs assist needed:: Contact Guard/Touching assist LTG: Pt will  ambulate up and down number of stairs: 1

## 2023-11-06 NOTE — Progress Notes (Signed)
 PROGRESS NOTE   Subjective/Complaints:  Pt doing well, but slept poorly. Agreeable to adding low dose melatonin. Pain well managed overall. He's unsure of when his LBM was, but recalls that it was overnight-- looks like that was 6/21 at 1am. Urinating fine. No other complaints or concerns. Does not wear O2 at home.   ROS: as per HPI. Denies CP, SOB, abd pain, N/V/D/C, or any other complaints at this time.    Objective:   DG CHEST PORT 1 VIEW Result Date: 11/04/2023 CLINICAL DATA:  Hypoxia EXAM: PORTABLE CHEST 1 VIEW COMPARISON:  Chest x-ray 10/31/2023 FINDINGS: Loop recorder device overlies the left chest. Cardiomediastinal silhouette is within normal limits. Multifocal reticular opacities and mild patchy right upper lobe opacities appear unchanged from the prior study. There is no pleural effusion or pneumothorax. IMPRESSION: 1. Chronic appearing lung disease. Patchy right upper lobe opacities may represent acute infection. Electronically Signed   By: Greig Pique M.D.   On: 11/04/2023 17:13   Recent Labs    11/04/23 0325  WBC 10.6*  HGB 11.1*  HCT 33.9*  PLT 242   Recent Labs    11/04/23 0325  NA 137  K 3.6  CL 104  CO2 26  GLUCOSE 89  BUN 19  CREATININE 0.79  CALCIUM  8.3*    Urinalysis    Component Value Date/Time   COLORURINE YELLOW 10/31/2023 1620   APPEARANCEUR CLEAR 10/31/2023 1620   LABSPEC 1.016 10/31/2023 1620   PHURINE 6.0 10/31/2023 1620   GLUCOSEU NEGATIVE 10/31/2023 1620   HGBUR SMALL (A) 10/31/2023 1620   BILIRUBINUR NEGATIVE 10/31/2023 1620   BILIRUBINUR negative 12/11/2022 1225   KETONESUR NEGATIVE 10/31/2023 1620   PROTEINUR 30 (A) 10/31/2023 1620   UROBILINOGEN 0.2 12/11/2022 1225   NITRITE POSITIVE (A) 10/31/2023 1620   LEUKOCYTESUR SMALL (A) 10/31/2023 1620  Amorphous crystals present No bacteria seen 6-10 RBC 0-5 squamous 11-20 WBCs    Intake/Output Summary (Last 24 hours) at  11/06/2023 1139 Last data filed at 11/06/2023 1010 Gross per 24 hour  Intake 568 ml  Output 350 ml  Net 218 ml        Physical Exam: Vital Signs Blood pressure (!) 128/97, pulse 93, temperature 99.3 F (37.4 C), temperature source Oral, resp. rate 15, height 5' 7 (1.702 m), weight 62.4 kg, SpO2 (!) 89%.  Constitution: Appropriate appearance for age. No apparent distress, sitting in chair working with OT Resp: No respiratory distress. No accessory muscle usage. CTAB and on 1 L [place] Cardio: Well perfused appearance.  Reg rate and rhythm trace edema along left hip but none elsewhere more peripherally. Abdomen: Nondistended. Nontender.  Positive bowel sounds, normal active Psych: Appropriate mood and affect. Skin: Surgical sites covered in Mepilex; no drainage Neuro: A&O x4, memory actually pretty good  PRIOR EXAMS:  Neuro: AAOx4.  Mild cognitive/memory deficits.  DTRs: Reflexes were 2+ in bilateral achilles, patella, biceps, BR and triceps. Babinsky: flexor responses b/l.   Hoffmans: negative b/l Sensory exam: revealed normal sensation in all dermatomal regions in bilateral upper extremities and bilateral lower extremities Motor exam: strength 5/5 throughout bilateral upper extremities, right lower extremity, and with exception of left lower extremity 1/5  hip flexion, 2/5 knee extension, 4/5 dorsiflexion and plantarflexion Coordination: Fine motor coordination was normal.    Assessment/Plan: 1. Functional deficits which require 3+ hours per day of interdisciplinary therapy in a comprehensive inpatient rehab setting. Physiatrist is providing close team supervision and 24 hour management of active medical problems listed below. Physiatrist and rehab team continue to assess barriers to discharge/monitor patient progress toward functional and medical goals  Care Tool:  Bathing              Bathing assist       Upper Body Dressing/Undressing Upper body dressing        Upper  body assist      Lower Body Dressing/Undressing Lower body dressing            Lower body assist       Toileting Toileting    Toileting assist       Transfers Chair/bed transfer  Transfers assist           Locomotion Ambulation   Ambulation assist              Walk 10 feet activity   Assist           Walk 50 feet activity   Assist           Walk 150 feet activity   Assist           Walk 10 feet on uneven surface  activity   Assist           Wheelchair     Assist               Wheelchair 50 feet with 2 turns activity    Assist            Wheelchair 150 feet activity     Assist          Blood pressure (!) 128/97, pulse 93, temperature 99.3 F (37.4 C), temperature source Oral, resp. rate 15, height 5' 7 (1.702 m), weight 62.4 kg, SpO2 (!) 89%.  Medical Problem List and Plan: 1. Functional deficits secondary to left three-part intertrochanteric hip fracture with subsequent IM nailing 11/01/2023.  Weightbearing as tolerated             -patient may shower with incisions covered             -ELOS/Goals: 10 to 14 days, supervision PT/OT             -Continue CIR 2.  Antithrombotics: -DVT/anticoagulation:  Mechanical: Antiembolism stockings, thigh (TED hose) Bilateral lower extremities.  Check vascular study             -antiplatelet therapy: Plavix  75 mg daily 3. Pain Management: tylenol  and Robaxin  as needed, discontinue IM Toradol  and begin tramadol 50mg  q6h PRN 4. Mood/Behavior/Sleep: Provide emotional support -antipsychotic agents: Seroquel  25 mg nightly started inpatient d/t delirum which later resolved -11/06/23 poor sleep, declined seroquel -- will make seroquel  PRN to avoid oversedation, will start melatonin 3mg  nightly; monitor sleep 5. Neuropsych/cognition: This patient is not capable of making decisions on his own behalf. 6. Skin/Wound Care: Routine skin checks 7.  Fluids/Electrolytes/Nutrition: Routine in and outs with follow-up chemistries, cont vitamins and supplements.  8.  History of right thalamic infarction.  Received CIR.  Continue Plavix  9.  BPH.  Flomax  0.4 mg daily check PVRs 10.  Hypertension.  Monitor with increased mobility.  Benicar 5mg  daily currently on hold due to some soft blood pressures and resume as  needed.   -11/06/23 BPs fine, monitor for trend Vitals:   11/05/23 1410 11/05/23 2018 11/06/23 0516  BP: 111/62 119/63 (!) 128/97    11.  Crestor  20 mg daily 12. Abnormal U/A in ED: -11/06/23 pt's U/A in ED had +nitrites, 11-20 WBCs, but no culture was done. Has mild leukocytosis, which is downtrending. No other s/sx of infection.  -for completion sake, will repeat U/A with reflex UCx-- if U/A unremarkable, no further work up needed; follow U/A +/- UCx -Low threshold to treat for UTI if s/sx develop-- pt with hx of splenectomy 13. Anemia: 11-12 range, monitor labs 14. H/o hereditary spherocytosis s/p splenectomy 15. COPD: continue nebs/bronchodilators PRN -11/06/23 appears he's now on 1L [place] O2 which is not a home requirement, CXR 6/20 similar to 6/16, not infectious appearing on my read. Lungs CTAB.   -no intervention for now  -monitor with activity, hopefully wean back to room air  -if cough develops or other s/sx of pulm issues, repeat CXR 16. Vit D deficiency, level 8.51 on 6/18, started on 50kU weekly 17. Constipation:   -11/06/23 LBM 6/21, continue colace 100mg  BID 18. GI ppx: pepcid  20mg  daily   I spent >37mins performing patient care related activities, including face to face time, documentation time, lab/results review, order/med management, discussion of orders/meds with patient and staff, and overall coordination of care.   LOS: 1 days A FACE TO FACE EVALUATION WAS PERFORMED  [address] 11/06/2023, 11:39 AM

## 2023-11-06 NOTE — Progress Notes (Signed)
 [name], DO  Physician Physical Medicine and Rehabilitation   PMR Pre-admission    Signed   Date of Service: 11/02/2023  3:38 PM  Related encounter: ED to Hosp-Admission (Discharged) from 10/31/2023 in [facility]   Signed     Expand All Collapse All  Show:Clear all [x] Written[x] Templated[x] Copied  Added by: [x] Alison Heron MATSU, RN  [] Hover for details PMR Admission Coordinator Pre-Admission Assessment   Patient: Samuel Mata is an 89 y.o., male MRN: [id number] DOB: 10-07-1930 Height: 5' 7 (170.2 cm) Weight: 64.4 kg                                                                                                                                   Insurance Information HMO:     PPO: yes     PCP:      IPA:      80/20:      OTHER:  PRIMARY: [employer] Medicare HMO/PPO      Policy#: [id number]      Subscriber: pt CM Name: Tiffany      Phone#: [phone]     Fax#: [phone] Pre-Cert#: [id number] approved 11/05/23 until 6/27 with updates 6/28      Employer:  Benefits:  Phone #: [phone]     Name: 6/20 Eff. Date: 05/18/23     Deduct: none      Out of Pocket Max: $4150      Life Max: none  CIR: $300 co pay per day days 1 until 6 ( $1800)      SNF: $10 co pay per day days 1 until 20; $214 co pay per day days 21 until 100 Outpatient: $10 per visit     Co-Pay:  Home Health: 100%      Co-Pay: none DME: 80%     Co-Pay: 20% Providers: in network  SECONDARY: none      Policy#:       Phone#:    Artist:       Phone#:    The Data processing manager" for patients in Inpatient Rehabilitation Facilities with attached "Privacy Act Statement-Health Care Records" was provided and verbally reviewed with: Patient and Family   Emergency Contact Information Contact Information       Name Relation Home Work Mobile    [facility] Spouse [phone]   [phone]    Joshwa, Hemric Daughter     [phone]    Artist, Bloom  Daughter     [phone]         Other Contacts   None on File      Current Medical History  Patient Admitting Diagnosis: left intertrochanteric hip fracture   History of Present Illness:  Samuel Mata is a 89 year old right-handed male with history of BPH, hypertension, 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  Independent without assistive device since CVA about 8 months ago but did have some balance impairments per daughter.  He did have an aide helping with shower 1 day a week wife completes other ADLs.  Presented 10/31/2023 after a fall in the kitchen when he lost his balance.  Denied loss of consciousness.  He experienced immediate left hip pain was brought to the emergency room.    Imaging of the pelvis demonstrated three-part left intertrochanteric hip fracture.  CT cervical spine did not show any acute abnormalities.  CTA and CT of the head did not demonstrate any acute intracranial abnormality or large vessel occlusion.  Chronic cerebellar and right thalamic infarcts were seen however.  On 11/01/2023 patient underwent intramedullary nailing of the left hip per Dr. Celena.  Postoperative Plavix  for history of CVA resumed.  Postoperatively noted some confusion maintained on Seroquel  as well as mild hypokalemia improved to 3.6, leukocytosis 13,000 improved to 10,600.  He did spike a low-grade fever 99.2.  He is weightbearing as tolerated to the left lower extremity.    Patient's medical record from [facility] has been reviewed by the rehabilitation admission coordinator and physician.   Past Medical History      Past Medical History:  Diagnosis Date   Bronchiectasis without complication (HCC)     Enlarged prostate     Hearing loss     History of kidney stones     Hypertension     Pulmonary nocardiosis (HCC) 2021     treated with 6 month antibiotic course   Stroke (HCC) 2022    left frontoparietal   TIA (transient ischemic attack) 2019        Has the patient had major surgery during 100 days prior to admission? Yes   Family History  family history is not on file.   Current Medications   Current Medications    Current Facility-Administered Medications:    acetaminophen  (TYLENOL ) tablet 500 mg, 500 mg, Oral, Q8H, Deward Eck, PA-C, 500 mg at 11/04/23 1403   alum & mag hydroxide-simeth (MAALOX/MYLANTA) 200-200-20 MG/5ML suspension 15 mL, 15 mL, Oral, Q4H PRN, Rashid, Farhan, MD   bisacodyl  (DULCOLAX) EC tablet 5 mg, 5 mg, Oral, Daily PRN, Deward Eck, PA-C   clopidogrel  (PLAVIX ) tablet 75 mg, 75 mg, Oral, Daily, Deward Eck, PA-C, 75 mg at 11/04/23 1038   docusate sodium  (COLACE) capsule 100 mg, 100 mg, Oral, BID, Deward Eck, PA-C, 100 mg at 11/04/23 1038   famotidine  (PEPCID ) tablet 20 mg, 20 mg, Oral, Daily, Rashid, Farhan, MD, 20 mg at 11/04/23 1404   feeding supplement (ENSURE PLUS HIGH PROTEIN) liquid 237 mL, 237 mL, Oral, BID BM, Rashid, Farhan, MD, 237 mL at 11/04/23 1359   hydrALAZINE (APRESOLINE) injection 5 mg, 5 mg, Intravenous, Q4H PRN, Deward Eck, PA-C   ipratropium-albuterol  (DUONEB) 0.5-2.5 (3) MG/3ML nebulizer solution 3 mL, 3 mL, Nebulization, Q8H, Rashid, Farhan, MD   ketorolac  (TORADOL ) 15 MG/ML injection 15 mg, 15 mg, Intravenous, Q6H PRN, Rashid, Farhan, MD   menthol -cetylpyridinium (CEPACOL) lozenge 3 mg, 1 lozenge, Oral, PRN, 3 mg at 11/02/23 1616 **OR** phenol (CHLORASEPTIC) mouth spray 1 spray, 1 spray, Mouth/Throat, PRN, Deward Eck, PA-C   methocarbamol  (ROBAXIN ) tablet 500 mg, 500 mg, Oral, Q6H PRN, 500 mg at 10/31/23 2202 **OR** [DISCONTINUED] methocarbamol  (ROBAXIN ) injection 500 mg, 500 mg, Intravenous, Q6H PRN, Deward Eck, PA-C   metoCLOPramide (REGLAN) tablet 5-10 mg, 5-10 mg, Oral, Q8H PRN **OR** metoCLOPramide (REGLAN) injection 5-10 mg, 5-10 mg,  Intravenous, Q8H  PRN, Deward Eck, PA-C   multivitamin with minerals tablet 1 tablet, 1 tablet, Oral, Daily, Deward Eck, PA-C   ondansetron  (ZOFRAN ) tablet 4 mg, 4 mg, Oral, Q6H PRN, 4 mg at 11/04/23 1423 **OR** ondansetron  (ZOFRAN ) injection 4 mg, 4 mg, Intravenous, Q6H PRN, Deward Eck, PA-C   polyethylene glycol (MIRALAX  / GLYCOLAX ) packet 17 g, 17 g, Oral, Daily PRN, Deward Eck, PA-C   QUEtiapine  (SEROQUEL ) tablet 25 mg, 25 mg, Oral, Once, Dezii, Alexandra, DO   QUEtiapine  (SEROQUEL ) tablet 25 mg, 25 mg, Oral, QHS, Rashid, Farhan, MD   Vitamin D  (Ergocalciferol ) (DRISDOL ) 1.25 MG (50000 UNIT) capsule 50,000 Units, 50,000 Units, Oral, Q7 days, Deward Eck, PA-C     Patients Current Diet:  Diet Order                  Diet regular Room service appropriate? No; Fluid consistency: Thin  Diet effective now                       Precautions / Restrictions Precautions Precautions: Fall, Other (comment) Precaution/Restrictions Comments: c/o pain with swallowing water; RN notified Restrictions Weight Bearing Restrictions Per Provider Order: Yes LLE Weight Bearing Per Provider Order: Weight bearing as tolerated    Has the patient had 2 or more falls or a fall with injury in the past year?Yes   Prior Activity Level Community (5-7x/wk): Independent without AD, until a month ago had weekly hired aide to assist with showering, now wife supervisies his showering   Prior Functional Level Prior Function Prior Level of Function : Independent/Modified Independent Mobility Comments: ind without device since CVA about 8 months ago but did have some balance impairment per daughter ADLs Comments: aide was helping pt with shower 1 day a week;  wife completes iADls   Self Care: Did the patient need help bathing, dressing, using the toilet or eating?  Needed some help   Indoor Mobility: Did the patient need assistance with walking from room to room (with or without device)? Independent   Stairs: Did the  patient need assistance with internal or external stairs (with or without device)? Independent   Functional Cognition: Did the patient need help planning regular tasks such as shopping or remembering to take medications? Independent   Patient Information Are you of Hispanic, Latino/a,or Spanish origin?: A. No, not of Hispanic, Latino/a, or Spanish origin What is your race?: A. White Do you need or want an interpreter to communicate with a doctor or health care staff?: 0. No   Patient's Response To:  Health Literacy and Transportation Is the patient able to respond to health literacy and transportation needs?: Yes Health Literacy - How often do you need to have someone help you when you read instructions, pamphlets, or other written material from your doctor or pharmacy?: Never In the past 12 months, has lack of transportation kept you from medical appointments or from getting medications?: No In the past 12 months, has lack of transportation kept you from meetings, work, or from getting things needed for daily living?: No   Home Assistive Devices / Equipment Home Equipment: [place] - single point, Grab bars - tub/shower, Tub bench, Agricultural consultant (2 wheels), Toilet riser   Prior Device Use: Indicate devices/aids used by the patient prior to current illness, exacerbation or injury? None of the above   Current Functional Level Cognition   Orientation Level: Oriented X4    Extremity Assessment (includes Sensation/Coordination)   Upper Extremity  Assessment: Generalized weakness  Lower Extremity Assessment: Defer to PT evaluation LLE Deficits / Details: grossly 3-/5     ADLs   Overall ADL's : Needs assistance/impaired Eating/Feeding: Set up, Sitting Grooming: Minimal assistance, Sitting Upper Body Bathing: Moderate assistance, Sitting, Bed level Lower Body Bathing: Maximal assistance, Sitting/lateral leans, Bed level Upper Body Dressing : Moderate assistance, Sitting, Bed level Lower  Body Dressing: Maximal assistance, Sitting/lateral leans, Bed level Toilet Transfer: Moderate assistance, +2 for physical assistance Toileting- Clothing Manipulation and Hygiene: Maximal assistance Functional mobility during ADLs: Moderate assistance, +2 for physical assistance     Mobility   Overal bed mobility: Needs Assistance Bed Mobility: Supine to Sit Supine to sit: Used rails, Mod assist, HOB elevated, +2 for safety/equipment General bed mobility comments: to L EOB, dense multimodal cues for technique with pt able to assist well with BLE movements toward EOB, but needs increased assist, up to modA to advance hips to EOB then for trunk lift/stability.     Transfers   Overall transfer level: Needs assistance Equipment used: Rolling walker (2 wheels) Transfers: Sit to/from Stand Sit to Stand: Min assist, Mod assist, +2 safety/equipment General transfer comment: EOB<>RW with minA +2 and +1 modA for step pivot bed>chair with increased time and cues for body mechanics for reduced pain.     Ambulation / Gait / Stairs / Wheelchair Mobility   Ambulation/Gait Ambulation/Gait assistance: Mod assist, +2 safety/equipment Gait Distance (Feet): 18 Feet Assistive device: Rolling walker (2 wheels) Gait Pattern/deviations: Step-to pattern, Decreased stance time - left, Antalgic, Trunk flexed, Decreased dorsiflexion - left, Decreased step length - right, Decreased step length - left General Gait Details: Pt needed cues to sequence steps and RW. No buckling but pt c/o significant pain/fatigue post-exertion and has not had IV access for pain meds and now c/o pain when swallowing, RN aware. ~27ft in room, seated break, then ~62ft pivoting to chair from EOB. Gait velocity: grossly <0.2 m/s Gait velocity interpretation: <1.31 ft/sec, indicative of household ambulator     Posture / Balance Dynamic Sitting Balance Sitting balance - Comments: seems to be using BUE to offload L hip, pt leaning slightly to R  side EOB/chair Balance Overall balance assessment: Needs assistance Sitting-balance support: Bilateral upper extremity supported, Feet supported Sitting balance-Leahy Scale: Poor Sitting balance - Comments: seems to be using BUE to offload L hip, pt leaning slightly to R side EOB/chair Standing balance support: Bilateral upper extremity supported, During functional activity, Reliant on assistive device for balance Standing balance-Leahy Scale: Poor Standing balance comment: static standing CGA with RW, min/modA dynamic standing tasks with RW     Special needs/care consideration Fall precautions. Should use RW at baseline due to balance issues, but patient does not utilize    Previous Home Environment  Living Arrangements: Spouse/significant other  Lives With: Spouse Available Help at Discharge: Family, Available 24 hours/day (wife can only provide supervision level) Type of Home: House Home Layout: One level Home Access: Stairs to enter Entrance Stairs-Rails: None Entrance Stairs-Number of Steps: 1 + 1 Bathroom Shower/Tub: Associate Professor: Yes Home Care Services: No Additional Comments: walked around neighborhood every day   Discharge Living Setting Plans for Discharge Living Setting: Patient's home, Lives with (comment) (spouse) Type of Home at Discharge: House Discharge Home Layout: One level Discharge Home Access: Stairs to enter Entrance Stairs-Rails: None Entrance Stairs-Number of Steps: 1 + 1 Discharge Bathroom Shower/Tub: Tub/shower unit Discharge Bathroom Toilet: Standard Discharge Bathroom Accessibility: Yes How Accessible:  Accessible via walker Does the patient have any problems obtaining your medications?: No   Social/Family/Support Systems Patient Roles: Spouse, Parent Contact Information: daughter, Lavern, he requests that wife not be his contact for it upsets her Anticipated Caregiver: wife Anticipated  Caregiver's Contact Information: Lavern, is nok, local daughter Ability/Limitations of Caregiver: wife can provide supervision level only. They can hire aide to provide physical care for 2 to 3 hrs per day, not 24/7 Caregiver Availability: 24/7 Discharge Plan Discussed with Primary Caregiver: Yes Is Caregiver In Agreement with Plan?: Yes Does Caregiver/Family have Issues with Lodging/Transportation while Pt is in Rehab?: No   Recent CIR 11/24 after CVA and went home at CGA/Supervision level.  Daughter, Lavern, is leaving for [place] this weekend. Her sister, [name] and his wife to be main contacts. They will provide contact info for staff upon his admit if there are any questions   Goals Patient/Family Goal for Rehab: supervision with PT and OT Expected length of stay: ELOS 10 to 14 days Additional Information: they can hire 2 to 3 hrs per day if physical caregiver supports, not 24/7 Pt/Family Agrees to Admission and willing to participate: Yes Program Orientation Provided & Reviewed with Pt/Caregiver Including Roles  & Responsibilities: Yes   Decrease burden of Care through IP rehab admission: n/a   Possible need for SNF placement upon discharge:if patient requires 24/7 physical assist, Lavern would pursue SNF. I explained on 6/18 that [facility] would unlikely cover both CIR admit as well as SNF   Patient Condition: This patient's condition remains as documented in the consult dated 11/03/23, in which the Rehabilitation Physician determined and documented that the patient's condition is appropriate for intensive rehabilitative care in an inpatient rehabilitation facility. Will admit to inpatient rehab Saturday, 11/04/23   Preadmission Screen Completed By:  Alison Heron Lot, RN, 11/04/2023 4:27 PM ______________________________________________________________________   Discussed status with Dr. Emeline on 11/04/23 at 1600 and received approval for admission Saturday 11/05/23    Admission Coordinator:  Alison Heron Lot, RN MSN time [id number] Date 11/04/23            Revision History  Routing History

## 2023-11-06 NOTE — Progress Notes (Signed)
 Physical Therapy Assessment and Plan  Patient Details  Name: Samuel Mata MRN: [id number] Date of Birth: Jan 25, 1931  PT Diagnosis: Abnormality of gait, Difficulty walking, and Muscle weakness Rehab Potential: Good ELOS: 21 days   Today's Date: 11/06/2023 PT Individual Time: 8895-8857 PT Individual Time Calculation (min): 38 min    Hospital Problem: Principal Problem:   Intertrochanteric fracture of left hip [facility])   Past Medical History:  Past Medical History:  Diagnosis Date   Bronchiectasis without complication (HCC)    Enlarged prostate    Hearing loss    History of kidney stones    Hypertension    Pulmonary nocardiosis (HCC) 2021   treated with 6 month antibiotic course   Stroke (HCC) 2022   left frontoparietal   TIA (transient ischemic attack) 2019   Past Surgical History:  Past Surgical History:  Procedure Laterality Date   APPENDECTOMY     CHOLECYSTECTOMY     INTRAMEDULLARY (IM) NAIL INTERTROCHANTERIC Left 11/01/2023   Procedure: FIXATION, FRACTURE, INTERTROCHANTERIC, WITH INTRAMEDULLARY ROD;  Surgeon: Celena Sharper, MD;  Location: [facility] OR;  Service: Orthopedics;  Laterality: Left;   LOOP RECORDER INSERTION N/A 01/15/2021   Procedure: LOOP RECORDER INSERTION;  Surgeon: Kelsie Agent, MD;  Location: [facility] INVASIVE CV LAB;  Service: Cardiovascular;  Laterality: N/A;   NOSE SURGERY     SPINE SURGERY     compressoin fx T12, kyphoplasty   SPLENECTOMY, TOTAL      Assessment & Plan Clinical Impression: Patient is a 89 y.o. year old male with recent admission to the hospital on 10/31/23 after a fall in the kitchen resulting in a intertrochanteric hip fx.   Samuel Mata is a 89 year old right-handed male with history of BPH, hypertension, pulmonary nocardiosis treated with 6 months antibiotic course, quit smoking 39 years ago, CVA right thalamic with loop recorder insertion and received CIR 04/12/2023 - 04/29/2023 maintained on Plavix .  Per chart review patient lives with  spouse.  1 level home one-step to entry.  Independent without assistive device since CVA about 8 months ago but did have some balance impairments per daughter.  He did have an aide helping with shower 1 day a week wife completes other ADLs.  Presented 10/31/2023 after a fall in the kitchen when he lost his balance.  Denied loss of consciousness.  He experienced immediate left hip pain was brought to the emergency room.  Imaging of the pelvis demonstrated three-part left intertrochanteric hip fracture.  CT cervical spine did not show any acute abnormalities.  CTA and CT of the head did not demonstrate any acute intracranial abnormality or large vessel occlusion.  Chronic cerebellar and right thalamic infarcts were seen however.  On 11/01/2023 patient underwent intramedullary nailing of the left hip per Dr. Celena.  Postoperative Plavix  for history of CVA resumed.  Postoperatively noted some confusion maintained on Seroquel  as well as mild hypokalemia improved to 3.6, leukocytosis 13,000 improved to 10,600.  He did spike a low-grade fever 99.2.  He is weightbearing as tolerated to the left lower extremity.  Therapy evaluations completed due to patient decreased functional mobility was admitted for a comprehensive rehab program.  Patient transferred to CIR on 11/05/2023 .   Patient currently requires mod with mobility secondary to muscle weakness and pain which is being managed by nursing.  Prior to hospitalization, aide was helping pt with shower 1 day a week; wife completes iADls. Pt lived with Spouse in a House home.  Home access is 1Stairs to enter.  Patient will benefit  from skilled PT intervention to maximize safe functional mobility, minimize fall risk, and decrease caregiver burden for planned discharge home with 24 hour assist.  Anticipate patient will benefit from follow up [facility] at discharge.  PT - End of Session Activity Tolerance: Decreased this session Endurance Deficit: Yes Endurance Deficit  Description: pain limiting PT Assessment Rehab Potential (ACUTE/IP ONLY): Good PT Barriers to Discharge: None PT Patient demonstrates impairments in the following area(s): Balance;Endurance;Motor;Pain;Safety PT Transfers Functional Problem(s): Bed Mobility;Bed to Chair;Car PT Locomotion Functional Problem(s): Ambulation;Wheelchair Mobility;Stairs PT Plan PT Intensity: Minimum of 1-2 x/day ,45 to 90 minutes PT Frequency: Total of 15 hours over 7 days of combined therapies PT Duration Estimated Length of Stay: 21 days PT Treatment/Interventions: Ambulation/gait training;DME/adaptive equipment instruction;Neuromuscular re-education;Stair training;UE/LE Strength taining/ROM;Wheelchair propulsion/positioning;Therapeutic Activities;Balance/vestibular training;Functional mobility training;Patient/family education;Therapeutic Exercise PT Recommendation Follow Up Recommendations: Home health PT Patient destination: Home Equipment Recommended: None recommended by PT Equipment Details: Pt states he has a 4 wheeled rolling walker and a wheelchair at home.   PT Evaluation Precautions/Restrictions Precautions Precautions: Fall;Other (comment) (WBAT L hip IM nailing; pain) Recall of Precautions/Restrictions: Impaired Restrictions Weight Bearing Restrictions Per Provider Order: Yes LLE Weight Bearing Per Provider Order: Weight bearing as tolerated General   Vital SignsTherapy Vitals Temp: 97.7 F (36.5 C) Temp Source: Oral Pulse Rate: 71 Resp: 20 BP: 132/77 Patient Position (if appropriate): Lying Oxygen Therapy SpO2: 96 % O2 Device: Nasal Cannula O2 Flow Rate (L/min): 1 L/min Pain Pain Assessment Pain Scale: Faces Pain Score: 5  Faces Pain Scale: Hurts whole lot Pain Location: Leg Pain Orientation: Left Pain Interference Pain Interference Pain Effect on Sleep: 2. Occasionally Pain Interference with Therapy Activities: 4. Almost constantly Pain Interference with Day-to-Day  Activities: 4. Almost constantly Home Living/Prior Functioning Home Living Available Help at Discharge: Family;Available 24 hours/day Type of Home: House Home Access: Stairs to enter [employer] of Steps: 1 Entrance Stairs-Rails: None Home Layout: One level Bathroom Shower/Tub: Tub/shower unit;Walk-in shower Bathroom Toilet: Standard Bathroom Accessibility: Yes Additional Comments: walked around neighborhood every day  Lives With: Spouse Prior Function Level of Independence: Needs assistance with ADLs;Needs assistance with gait  Able to Take Stairs?: Yes Driving: No Vocation: Retired Optometrist - History Ability to See in Adequate Light: 0 Adequate Perception Perception: Within Functional Limits Praxis Praxis: WFL  Cognition Overall Cognitive Status: No family/caregiver present to determine baseline cognitive functioning Arousal/Alertness: Lethargic Orientation Level: Oriented X4 Memory: Appears intact Awareness: Appears intact Problem Solving: Appears intact Sensation Sensation Light Touch: Appears Intact Hot/Cold: Appears Intact Proprioception: Appears Intact Stereognosis: Appears Intact Additional Comments: cramping in LLE during eval Coordination Gross Motor Movements are Fluid and Coordinated: No Fine Motor Movements are Fluid and Coordinated: Not tested Motor  Motor Motor - Skilled Clinical Observations: R LE 4+/5 throughout; L LE unable to be tested due to L LE pain/soreness with exception of DF 4+/5. R LE AROM WNL; L LE PROM knee flexion to approx 70 degrees and extension full.   Trunk/Postural Assessment  Cervical Assessment Cervical Assessment: Within Functional Limits Thoracic Assessment Thoracic Assessment: Within Functional Limits Lumbar Assessment Lumbar Assessment: Within Functional Limits Postural Control Postural Control: Within Functional Limits  Balance Balance Balance Assessed: No Dynamic Sitting  Balance Sitting balance - Comments: seems to be using BUE to offload L hip, pt leaning slightly to R side EOB/chair Extremity Assessment      RLE Assessment RLE Assessment: Within Functional Limits General Strength Comments: 4+/5 throughout LLE Assessment Passive Range of Motion (PROM)  Comments: approx 70 degrees knee flexion; extension appears full. Active Range of Motion (AROM) Comments: Pt unable to actively move L LE due to pain supine or seated; he was unable to assist getting on/off of leg rest of wheelchair General Strength Comments: L unable to be assessed except DF 4+/5  Care Tool Care Tool Bed Mobility Roll left and right activity Roll left and right activity did not occur: Safety/medical concerns      Sit to lying activity   Sit to lying assist level: Moderate Assistance - Patient 50 - 74%    Lying to sitting on side of bed activity   Lying to sitting on side of bed assist level: the ability to move from lying on the back to sitting on the side of the bed with no back support.: Moderate Assistance - Patient 50 - 74%     Care Tool Transfers Sit to stand transfer   Sit to stand assist level: Minimal Assistance - Patient > 75%    Chair/bed transfer   Chair/bed transfer assist level: Minimal Assistance - Patient > 75%    Car transfer Car transfer activity did not occur: Safety/medical concerns        Care Tool Locomotion Ambulation   Assist level: Minimal Assistance - Patient > 75% Assistive device: Walker-rolling Max distance: 8 ft  Walk 10 feet activity Walk 10 feet activity did not occur: Safety/medical concerns       Walk 50 feet with 2 turns activity Walk 50 feet with 2 turns activity did not occur: Safety/medical concerns      Walk 150 feet activity Walk 150 feet activity did not occur: Safety/medical concerns      Walk 10 feet on uneven surfaces activity Walk 10 feet on uneven surfaces activity did not occur: Safety/medical concerns      Stairs  Stair activity did not occur: Safety/medical concerns        Walk up/down 1 step activity Walk up/down 1 step or curb (drop down) activity did not occur: Safety/medical concerns      Walk up/down 4 steps activity Walk up/down 4 steps activity did not occur: Safety/medical concerns      Walk up/down 12 steps activity Walk up/down 12 steps activity did not occur: Safety/medical concerns      Pick up small objects from floor Pick up small object from the floor (from standing position) activity did not occur: Safety/medical concerns      Wheelchair Is the patient using a wheelchair?: Yes Type of Wheelchair: Manual Wheelchair activity did not occur: Safety/medical concerns      Wheel 50 feet with 2 turns activity Wheelchair 50 feet with 2 turns activity did not occur: Safety/medical concerns    Wheel 150 feet activity Wheelchair 150 feet activity did not occur: Safety/medical concerns      Refer to Care Plan for Long Term Goals  SHORT TERM GOAL WEEK 1 PT Short Term Goal 1 (Week 1): Pt will be able to perform supine to sit transfer with MI. PT Short Term Goal 2 (Week 1): Pt will be able to perform sit to supine transfer with MI. PT Short Term Goal 3 (Week 1): Pt will be able to perform STS transfer CGA. PT Short Term Goal 4 (Week 1): Pt will be able to ambulate x 60 ft with RW with PT CGA  Recommendations for other services: None   Skilled Therapeutic Intervention Mobility Bed Mobility Bed Mobility: Supine to Sit;Sit to Supine Supine to Sit: Moderate Assistance -  Patient 50-74% Sit to Supine: Moderate Assistance - Patient 50-74% Transfers Transfers: Sit to Stand;Stand to Sit;Stand Pivot Transfers Sit to Stand: Minimal Assistance - Patient > 75% Stand to Sit: Minimal Assistance - Patient > 75% Stand Pivot Transfers: Minimal Assistance - Patient > 75% Stand Pivot Transfer Details: Verbal cues for sequencing;Verbal cues for technique Transfer (Assistive device): Rolling  walker Locomotion  Gait Ambulation: Yes Gait Distance (Feet): 8 Feet Assistive device: Rolling walker Gait Gait: Yes Gait Pattern: Impaired Gait Pattern: Decreased stance time - left;Decreased step length - left;Decreased stride length;Left flexed knee in stance;Trunk flexed Stairs / Additional Locomotion Stairs: No Wheelchair Mobility Wheelchair Mobility: No  For evaluation/treatment, pt was received sitting in wheelchair asleep. He denied pain and was agreeable to PT evaluation and treatment but reports extreme fatigue. Nursing reports they walked with him to/from the bathroom yesterday x2  but this morning, they were able to walk with him to the bathroom but he became tired on return to his chair. They also reported he did his OT eval this AM.  During the evaluation seated, pt began to experience muscle spasms and pain with minimal movement of LLE. PT alerted nursing who states that pt has declined any true pain medication other than tylenol . PT/nursing discussed with pt importance of pain control and PT discussed importance of pain management for full rehab potential. Pt agreed to pain medication. PT discussed with pt with stand pivot transfers and gait importance of proper positioning of bil Les for energy efficiency/assistance with pain management of LLE for WB purposes. Pt was able to perform but was having a lot of spasms. Nursing stated he could have Robaxin  if he agreed to take it but she would speak with him.  Pt left in bed, bed alarm on, no complaints of pain but with complaints of spasms of which nursing was made aware. All needs within reach.   Discharge Criteria: Patient will be discharged from PT if patient refuses treatment 3 consecutive times without medical reason, if treatment goals not met, if there is a change in medical status, if patient makes no progress towards goals or if patient is discharged from hospital.  The above assessment, treatment plan, treatment  alternatives and goals were discussed and mutually agreed upon: by patient  Alger Ada 11/06/2023, 1:02 PM

## 2023-11-06 NOTE — Progress Notes (Signed)
 Samuel Arthea DASEN, MD  Physician Physical Medicine and Rehabilitation   Consult Note    Signed   Date of Service: 11/03/2023 11:25 AM  Related encounter: ED to Hosp-Admission (Discharged) from 10/31/2023 in [facility]   Signed     Expand All Collapse All  Show:Clear all [x] Written[x] Templated[] Copied  Added by: [x] Samuel Arthea DASEN, MD  [] Hover for details          Physical Medicine and Rehabilitation Consult Reason for Consult: Impaired functional mobility after left hip fracture Referring Physician: Dino     HPI: Samuel Mata is a 89 y.o. male with a history of hypertension, prior stroke, pulmonary nocardiosis, BPH who fell in his kitchen on October 31, 2023 when he lost his balance.  He experienced immediate left hip pain and was brought to the emergency room.  Imaging of the pelvis demonstrated a three-part left intertrochanteric hip fracture.  CT of the cervical spine did not show any acute abnormalities.  CTA and CT of the head did not demonstrate any acute intracranial or large vessel occlusion.  Chronic cerebellar and right thalamic infarcts were seen however.  On 11/01/2023 patient underwent intramedullary nailing of the left hip by Dr. Celena.  Patient has been confused postoperatively especially last evening, pulling on lines and catheters.  Mild hypokalemia noted as well as leukocytosis of 13,000 today.  He did have a low-grade temperature early this morning of 99.2.  Patient is weightbearing as tolerated left lower extremity.  He was up with therapies yesterday and was mod assist for sit to stand transfers and mod assist 8 feet using a rolling walker.  Therapy notes shuffling, antalgic gait.  Patient was moderate to maximal assistance for basic ADLs.  Prior to arrival patient was independent without a device for gait but was having some intermittent balance issues apparently and was advised to use a walker.  He lives with his spouse in a 1 level house  with 1+1 steps to enter.  Patient walked around his neighborhood every day by report.       Home: Home Living Family/patient expects to be discharged to:: Private residence Living Arrangements: Spouse/significant other Available Help at Discharge: Family, Available 24 hours/day (wife can only provide supervision level) Type of Home: House Home Access: Stairs to enter [employer] of Steps: 1 + 1 Entrance Stairs-Rails: None Home Layout: One level Bathroom Shower/Tub: Engineer, manufacturing systems: Standard Bathroom Accessibility: Yes Home Equipment: Cane - single point, Grab bars - tub/shower, Tub bench, Rolling Walker (2 wheels), Toilet riser Additional Comments: walked around neighborhood every day  Lives With: Spouse  Functional History: Prior Function Prior Level of Function : Independent/Modified Independent Mobility Comments: ind without device since CVA about 8 months ago but did have some balance impairment per daughter ADLs Comments: aide was helping pt with shower 1 day a week;  wife completes iADls Functional Status:  Mobility: Bed Mobility Overal bed mobility: Needs Assistance Bed Mobility: Supine to Sit Supine to sit: Mod assist, HOB elevated, Used rails General bed mobility comments: up with PT upon arrival Transfers Overall transfer level: Needs assistance Equipment used: Rolling walker (2 wheels) Transfers: Sit to/from Stand Sit to Stand: Mod assist, +2 physical assistance, From elevated surface General transfer comment: Pt required mod assist of 2 to rise and steady as he has difficulty with weight bearing on left LE.  Can stand statically with CGA with RW. Ambulation/Gait Ambulation/Gait assistance: Mod assist, +2 physical assistance Gait Distance (  Feet): 8 Feet Assistive device: Rolling walker (2 wheels) Gait Pattern/deviations: Step-to pattern, Decreased step length - left, Decreased stance time - left, Decreased weight shift to left,  Knee flexed in stance - left, Shuffle, Antalgic, Knees buckling, Trunk flexed General Gait Details: Pt needed cues to sequence steps and RW. Pt having difficulty weight bearing on left LE due to pain and daughter states this leg was weaker prior to the fall. Pt needing mod assist of 2 as left knee buckled x 3 with steps due to weakness and pain.  Limited distance due to this.  . Gait velocity interpretation: <1.31 ft/sec, indicative of household ambulator   ADL: ADL Overall ADL's : Needs assistance/impaired Eating/Feeding: Set up, Sitting Grooming: Minimal assistance, Sitting Upper Body Bathing: Moderate assistance, Sitting, Bed level Lower Body Bathing: Maximal assistance, Sitting/lateral leans, Bed level Upper Body Dressing : Moderate assistance, Sitting, Bed level Lower Body Dressing: Maximal assistance, Sitting/lateral leans, Bed level Toilet Transfer: Moderate assistance, +2 for physical assistance Toileting- Clothing Manipulation and Hygiene: Maximal assistance Functional mobility during ADLs: Moderate assistance, +2 for physical assistance   Cognition: Cognition Orientation Level: Oriented X4 Cognition Arousal: Alert Behavior During Therapy: Flat affect     Review of Systems  Unable to perform ROS: Mental acuity        Past Medical History:  Diagnosis Date   Bronchiectasis without complication (HCC)     Enlarged prostate     Hearing loss     History of kidney stones     Hypertension     Pulmonary nocardiosis (HCC) 2021    treated with 6 month antibiotic course   Stroke (HCC) 2022    left frontoparietal   TIA (transient ischemic attack) 2019             Past Surgical History:  Procedure Laterality Date   APPENDECTOMY       CHOLECYSTECTOMY       INTRAMEDULLARY (IM) NAIL INTERTROCHANTERIC Left 11/01/2023    Procedure: FIXATION, FRACTURE, INTERTROCHANTERIC, WITH INTRAMEDULLARY ROD;  Surgeon: Celena Sharper, MD;  Location: [facility] OR;  Service: Orthopedics;  Laterality:  Left;   LOOP RECORDER INSERTION N/A 01/15/2021    Procedure: LOOP RECORDER INSERTION;  Surgeon: Kelsie Agent, MD;  Location: [facility] INVASIVE CV LAB;  Service: Cardiovascular;  Laterality: N/A;   NOSE SURGERY       SPINE SURGERY        compressoin fx T12, kyphoplasty   SPLENECTOMY, TOTAL                 Family History  Problem Relation Age of Onset   Lung disease Neg Hx          Social History:  reports that he quit smoking about 39 years ago. His smoking use included cigarettes. He started smoking about 69 years ago. He has a 30 pack-year smoking history. He has never used smokeless tobacco. He reports that he does not drink alcohol  and does not use drugs. Allergies:  Allergies       Allergies  Allergen Reactions   Codeine        Unknown reaction            Medications Prior to Admission  Medication Sig Dispense Refill   clopidogrel  (PLAVIX ) 75 MG tablet Take 1 tablet (75 mg total) by mouth daily. 90 tablet 0   famotidine  (PEPCID ) 20 MG tablet Take 1 tablet (20 mg total) by mouth daily. (Patient taking differently: Take 20 mg by mouth every evening.) 30  tablet 0   fluticasone  (FLONASE ) 50 MCG/ACT nasal spray Place 2 sprays into both nostrils daily as needed for allergies.       Naphazoline HCl (CLEAR EYES OP) Place 1 drop into both eyes daily as needed (for eye redness).       olmesartan (BENICAR) 5 MG tablet Take 5 mg by mouth daily.       polyvinyl alcohol  (LIQUIFILM TEARS) 1.4 % ophthalmic solution Place 2 drops into the right eye 4 (four) times daily. (Patient taking differently: Place 2 drops into the right eye daily as needed for dry eyes.) 15 mL 0   rosuvastatin  (CRESTOR ) 20 MG tablet Take 1 tablet (20 mg total) by mouth daily with supper. 30 tablet 0   sodium chloride  (OCEAN) 0.65 % nasal spray Place 1 spray into the nose as needed for congestion.       tamsulosin  (FLOMAX ) 0.4 MG CAPS capsule Take 1 capsule (0.4 mg total) by mouth daily. (Patient taking differently: Take  0.4 mg by mouth daily after supper.) 90 capsule 1   acetaminophen  (TYLENOL ) 325 MG tablet Take 650 mg by mouth as needed for headache.       amLODipine  (NORVASC ) 5 MG tablet Take 1 tablet (5 mg total) by mouth daily. (Patient not taking: Reported on 10/31/2023) 30 tablet 0   nitroGLYCERIN  (NITROSTAT ) 0.4 MG SL tablet Place 1 tablet (0.4 mg total) under the tongue every 5 (five) minutes as needed for chest pain. 25 tablet 4            Blood pressure 112/83, pulse 96, temperature 98 F (36.7 C), temperature source Oral, resp. rate 18, height 5' 7 (1.702 m), weight 64.4 kg, SpO2 90%. Physical Exam Constitutional:      Comments: Asleep, slow to awaken  HENT:     Head: Normocephalic and atraumatic.     Nose: Nose normal.     Mouth/Throat:     Comments: Oral membranes dry   Eyes:     Conjunctiva/sclera: Conjunctivae normal.     Pupils: Pupils are equal, round, and reactive to light.      Cardiovascular:     Rate and Rhythm: Normal rate.  Pulmonary:     Effort: Pulmonary effort is normal.  Abdominal:     Palpations: Abdomen is soft.    Musculoskeletal:        General: Swelling (left hip) and tenderness (Left hip) present.     Cervical back: Normal range of motion.    Skin:    General: Skin is warm.     Comments: Foam dressing over left hip incision    Neurological:     Comments: Pt awakened to verbal cues. Oriented to person. Speech very dysarthric, still lethargic, difficult to discern whether he was aware of place, date, etc. Did make eye contact. Attempted to follow basic commands. Poor oro-motor control. No focal CN abnl. MMT: Moves both UE against gravity. Wiggled toes of left foot. Moved RLE 2-4/5 prox to distal. Sensed pain in all 4's.   Psychiatric:     Comments: Flat but pleasant when awake enough        Lab Results Last 24 Hours       Results for orders placed or performed during the hospital encounter of 10/31/23 (from the past 24 hours)  CBC with  Differential/Platelet     Status: Abnormal    Collection Time: 11/03/23  3:24 AM  Result Value Ref Range    WBC 13.0 (H) 4.0 - 10.5  K/uL    RBC 4.31 4.22 - 5.81 MIL/uL    Hemoglobin 13.4 13.0 - 17.0 g/dL    HCT 59.4 60.9 - 47.9 %    MCV 94.0 80.0 - 100.0 fL    MCH 31.1 26.0 - 34.0 pg    MCHC 33.1 30.0 - 36.0 g/dL    RDW 87.4 88.4 - 84.4 %    Platelets 271 150 - 400 K/uL    nRBC 0.0 0.0 - 0.2 %    Neutrophils Relative % 80 %    Neutro Abs 10.3 (H) 1.7 - 7.7 K/uL    Lymphocytes Relative 5 %    Lymphs Abs 0.7 0.7 - 4.0 K/uL    Monocytes Relative 14 %    Monocytes Absolute 1.8 (H) 0.1 - 1.0 K/uL    Eosinophils Relative 1 %    Eosinophils Absolute 0.1 0.0 - 0.5 K/uL    Basophils Relative 0 %    Basophils Absolute 0.0 0.0 - 0.1 K/uL    Immature Granulocytes 0 %    Abs Immature Granulocytes 0.05 0.00 - 0.07 K/uL  Basic metabolic panel with GFR     Status: Abnormal    Collection Time: 11/03/23  3:24 AM  Result Value Ref Range    Sodium 137 135 - 145 mmol/L    Potassium 3.4 (L) 3.5 - 5.1 mmol/L    Chloride 104 98 - 111 mmol/L    CO2 25 22 - 32 mmol/L    Glucose, Bld 103 (H) 70 - 99 mg/dL    BUN 18 8 - 23 mg/dL    Creatinine, Ser 9.00 0.61 - 1.24 mg/dL    Calcium  8.7 (L) 8.9 - 10.3 mg/dL    GFR, Estimated >39 >39 mL/min    Anion gap 8 5 - 15       Imaging Results (Last 48 hours)  DG FEMUR PORT MIN 2 VIEWS LEFT Result Date: 11/01/2023 CLINICAL DATA:  Fracture, postop. EXAM: LEFT FEMUR PORTABLE 2 VIEWS COMPARISON:  Operative imaging FINDINGS: Femoral intramedullary nail with trans trochanteric and distal locking screw fixation traversing proximal femur fracture. Improved fracture alignment from preoperative imaging. Recent postsurgical change includes air and edema in the soft tissues. IMPRESSION: ORIF of proximal femur fracture without immediate postoperative complication. Electronically Signed   By: Andrea Gasman M.D.   On: 11/01/2023 18:03    DG FEMUR MIN 2 VIEWS LEFT Result  Date: 11/01/2023 CLINICAL DATA:  [id number] Surgery, elective [id number] EXAM: LEFT FEMUR 2 VIEWS COMPARISON:  Preoperative radiograph FINDINGS: Eight fluoroscopic spot views of the left femur submitted from the operating room. Femoral intramedullary nail with trans trochanteric and distal locking screw fixation traverse proximal femur fracture. Fluoroscopy time 1 minutes 14 seconds. Dose 14.72 mGy. IMPRESSION: Intraoperative fluoroscopy during proximal femur fracture fixation. Electronically Signed   By: Andrea Gasman M.D.   On: 11/01/2023 18:02    DG C-Arm 1-60 Min-No Report Result Date: 11/01/2023 Fluoroscopy was utilized by the requesting physician.  No radiographic interpretation.    DG C-Arm 1-60 Min-No Report Result Date: 11/01/2023 Fluoroscopy was utilized by the requesting physician.  No radiographic interpretation.       Assessment/Plan: Diagnosis: 89 year old male with a history of prior CVA status post left three-part intertrochanteric hip fracture with subsequent IM nail. Does the need for close, 24 hr/day medical supervision in concert with the patient's rehab needs make it unreasonable for this patient to be served in a less intensive setting? Yes Co-Morbidities requiring supervision/potential complications:  - Postoperative  confusion/delirium -Elevated white blood cell count and low-grade temperature -Hypokalemia -Postoperative pain control Due to bladder management, bowel management, safety, skin/wound care, disease management, medication administration, pain management, and patient education, does the patient require 24 hr/day rehab nursing? Yes Does the patient require coordinated care of a physician, rehab nurse, therapy disciplines of PT and OT to address physical and functional deficits in the context of the above medical diagnosis(es)? Yes Addressing deficits in the following areas: balance, endurance, locomotion, strength, transferring, bowel/bladder control, bathing, dressing,  feeding, grooming, toileting, and psychosocial support Can the patient actively participate in an intensive therapy program of at least 3 hrs of therapy per day at least 5 days per week? Yes The potential for patient to make measurable gains while on inpatient rehab is excellent Anticipated functional outcomes upon discharge from inpatient rehab are supervision  with PT, supervision with OT, n/a with SLP. Estimated rehab length of stay to reach the above functional goals is: 7-10 days Anticipated discharge destination: Home Overall Rehab/Functional Prognosis: excellent   POST ACUTE RECOMMENDATIONS: This patient's condition is appropriate for continued rehabilitative care in the following setting: CIR Patient has agreed to participate in recommended program. N/A Note that insurance prior authorization may be required for reimbursement for recommended care.   Comment: Pt still slow to awaken and confused during my visit. Didn't sleep much last night. Low grade temp/leukocytosis as well. Rehab Admissions Coordinator to follow up.       MEDICAL RECOMMENDATIONS: Consider checking UA, UCx Use seroquel  earlier in the evening for sundowning to prevent AM lethargy.  Minimize narcotics, muscle relaxants,  and other neuro-sedating medications as possible     I have personally performed a face to face diagnostic evaluation of this patient. Additionally, I have examined the patient's medical record including any pertinent labs and radiographic images.     Thanks,   Arthea ONEIDA Gunther, MD 11/03/2023          Routing History

## 2023-11-06 NOTE — Evaluation (Signed)
 Occupational Therapy Assessment and Plan  Patient Details  Name: Samuel Mata MRN: [id number] Date of Birth: October 23, 1930  OT Diagnosis: acute pain, lumbago (low back pain), and muscle weakness (generalized) Rehab Potential: Rehab Potential (ACUTE ONLY): Good ELOS: 14-16 days   Today's Date: 11/06/2023 OT Individual Time: 9192-9069 & [id number] OT Individual Time Calculation (min): 83 min & 73 min  Hospital Problem: Principal Problem:   Intertrochanteric fracture of left hip [facility])   Past Medical History:  Past Medical History:  Diagnosis Date   Bronchiectasis without complication (HCC)    Enlarged prostate    Hearing loss    History of kidney stones    Hypertension    Pulmonary nocardiosis (HCC) 2021   treated with 6 month antibiotic course   Stroke (HCC) 2022   left frontoparietal   TIA (transient ischemic attack) 2019   Past Surgical History:  Past Surgical History:  Procedure Laterality Date   APPENDECTOMY     CHOLECYSTECTOMY     INTRAMEDULLARY (IM) NAIL INTERTROCHANTERIC Left 11/01/2023   Procedure: FIXATION, FRACTURE, INTERTROCHANTERIC, WITH INTRAMEDULLARY ROD;  Surgeon: Celena Sharper, MD;  Location: [facility] OR;  Service: Orthopedics;  Laterality: Left;   LOOP RECORDER INSERTION N/A 01/15/2021   Procedure: LOOP RECORDER INSERTION;  Surgeon: Kelsie Agent, MD;  Location: [facility] INVASIVE CV LAB;  Service: Cardiovascular;  Laterality: N/A;   NOSE SURGERY     SPINE SURGERY     compressoin fx T12, kyphoplasty   SPLENECTOMY, TOTAL      Assessment & Plan Clinical Impression: Samuel Mata is an 89 year old right-handed male with history of BPH, hypertension, pulmonary nocardiosis treated with 6 months antibiotic course, quit smoking 39 years ago, CVA right thalamic with loop recorder insertion and received CIR 04/12/2023 - 04/29/2023 maintained on Plavix . Per chart review patient lives with spouse. 1 level home one-step to entry. Independent without assistive device since CVA about 8  months ago but did have some balance impairments per daughter. He did have an aide helping with shower 1 day a week wife completes other ADLs. Presented 10/31/2023 after a fall in the kitchen when he lost his balance. Denied loss of consciousness. He experienced immediate left hip pain was brought to the emergency room. Imaging of the pelvis demonstrated three-part left intertrochanteric hip fracture. CT cervical spine did not show any acute abnormalities. CTA and CT of the head did not demonstrate any acute intracranial abnormality or large vessel occlusion. Chronic cerebellar and right thalamic infarcts were seen however. On 11/01/2023 patient underwent intramedullary nailing of the left hip per Dr. Celena. Postoperative Plavix  for history of CVA resumed. Postoperatively noted some confusion maintained on Seroquel  as well as mild hypokalemia improved to 3.6, leukocytosis 13,000 improved to 10,600. He did spike a low-grade fever 99.2. He is weightbearing as tolerated to the left lower extremity. Therapy evaluations completed due to patient decreased functional mobility was admitted for a comprehensive rehab program. .  Patient transferred to CIR on 11/05/2023 .    Patient currently requires max with basic self-care skills secondary to muscle weakness and muscle joint tightness, decreased cardiorespiratoy endurance, and decreased standing balance, decreased postural control, and decreased balance strategies.  Prior to hospitalization, patient could complete transfers with SBA and wife assisted with all other ADLs.  Patient will benefit from skilled intervention to decrease level of assist with basic self-care skills and increase independence with basic self-care skills prior to discharge home with care partner.  Anticipate patient will require 24 hour supervision and moderate physical assestance with  ADLs at baseline and follow up home health.  OT - End of Session Activity Tolerance: Tolerates 30+ min activity  with multiple rests Endurance Deficit: Yes Endurance Deficit Description: pain limiting OT Assessment Rehab Potential (ACUTE ONLY): Good OT Patient demonstrates impairments in the following area(s): Balance;Safety;Cognition;Endurance;Motor;Pain;Skin Integrity OT Basic ADL's Functional Problem(s): Grooming;Dressing;Toileting OT Transfers Functional Problem(s): Toilet;Tub/Shower OT Additional Impairment(s): None OT Plan OT Intensity: Minimum of 1-2 x/day, 45 to 90 minutes OT Frequency: 5 out of 7 days OT Duration/Estimated Length of Stay: 14-16 days OT Treatment/Interventions: Balance/vestibular training;Discharge planning;Pain management;Self Care/advanced ADL retraining;Therapeutic Activities;UE/LE Coordination activities;Cognitive remediation/compensation;Disease mangement/prevention;Functional mobility training;Patient/family education;Skin care/wound managment;Therapeutic Exercise;Community reintegration;DME/adaptive equipment instruction;Neuromuscular re-education;Psychosocial support;Splinting/orthotics;UE/LE Strength taining/ROM;Wheelchair propulsion/positioning OT Self Feeding Anticipated Outcome(s): set up OT Basic Self-Care Anticipated Outcome(s): Min A OT Toileting Anticipated Outcome(s): SBA OT Bathroom Transfers Anticipated Outcome(s): SBA OT Recommendation Recommendations for Other Services: Therapeutic Recreation consult Therapeutic Recreation Interventions: Pet therapy Patient destination: Home Follow Up Recommendations: 24 hour supervision/assistance;Home health OT Equipment Recommended: To be determined   OT Evaluation Precautions/Restrictions  Precautions Precautions: Fall Recall of Precautions/Restrictions: Impaired Precaution/Restrictions Comments: 1L O2 Restrictions Weight Bearing Restrictions Per Provider Order: Yes LLE Weight Bearing Per Provider Order: Weight bearing as tolerated General Chart Reviewed: Yes Response to Previous Treatment: Not  applicable Family/Caregiver Present: No Vital Signs Therapy Vitals Temp: 97.7 F (36.5 C) Temp Source: Oral Pulse Rate: 71 Resp: 20 BP: 132/77 Patient Position (if appropriate): Lying Oxygen Therapy SpO2: 96 % O2 Device: Nasal Cannula O2 Flow Rate (L/min): 1 L/min Home Living/Prior Functioning Home Living Family/patient expects to be discharged to:: Private residence Living Arrangements: Spouse/significant other, Children Available Help at Discharge: Family, Available 24 hours/day (spouse cannot provide transfer assistance) Type of Home: House Home Access: Stairs to enter Secretary/administrator of Steps: 1 Entrance Stairs-Rails: None Home Layout: One level Bathroom Shower/Tub: Tub/shower unit, Health visitor: Standard Bathroom Accessibility: Yes Additional Comments: walked around neighborhood every day  Lives With: Spouse IADL History Homemaking Responsibilities: No Current License: Yes Mode of Transportation: Set designer Occupation: Retired Prior Function Level of Independence: Needs assistance with ADLs, Needs assistance with gait (was also walking with RW) Bath: Maximal Toileting: Maximal Dressing: Maximal Grooming: Supervision/set-up  Able to Take Stairs?: Yes Driving: Yes Vocation: Retired Leisure: Hobbies-yes (Comment) Vision Baseline Vision/History: 1 Wears glasses Ability to See in Adequate Light: 0 Adequate Patient Visual Report: Other (comment) (pt reporting increased burning sensation in eyes, especially after waking up) Vision Assessment?: Wears glasses for reading;Wears glasses for driving Perception  Perception: Impaired Perception-Other Comments: may be some very slight Lt inattection from prior stroke Praxis Praxis: Impaired Praxis Impairment Details: Initiation;Motor planning Cognition Cognition Overall Cognitive Status: No family/caregiver present to determine baseline cognitive functioning Arousal/Alertness:  Awake/alert Orientation Level: Person;Situation;Place Person: Oriented Place: Oriented Situation: Oriented Memory: Impaired Memory Impairment: Decreased short term memory;Decreased recall of new information Decreased Short Term Memory: Verbal complex;Functional basic Awareness: Appears intact Problem Solving: Impaired Executive Function: Sequencing;Organizing;Decision Making;Initiating Sequencing: Impaired Sequencing Impairment: Functional basic Organizing: Impaired Organizing Impairment: Functional basic Decision Making: Impaired Decision Making Impairment: Functional basic;Verbal complex Initiating: Impaired Initiating Impairment: Functional basic Safety/Judgment: Impaired Brief Interview for Mental Status (BIMS) Repetition of Three Words (First Attempt): 3 Temporal Orientation: Year: Correct Temporal Orientation: Month: Accurate within 5 days Temporal Orientation: Day: Incorrect (Monday) Recall: Sock: Yes, no cue required Recall: Blue: Yes, no cue required Recall: Bed: Yes, no cue required BIMS Summary Score: 14 Sensation Sensation Light Touch: Appears Intact Hot/Cold: Appears Intact Proprioception: Appears Intact Stereognosis:  Appears Intact Coordination Gross Motor Movements are Fluid and Coordinated: No Fine Motor Movements are Fluid and Coordinated: Yes Coordination and Movement Description: gross motor deficit 2/2 weakness/pain in LLE Motor  Motor Motor: Other (comment) Motor - Skilled Clinical Observations: 2/2 weakness/pain  Trunk/Postural Assessment  Cervical Assessment Cervical Assessment: Exceptions to [facility] (forward head) Thoracic Assessment Thoracic Assessment: Exceptions to [facility] (rounded shoulders) Lumbar Assessment Lumbar Assessment: Exceptions to [facility] (posterior pelvic tilt) Postural Control Postural Control: Deficits on evaluation (delayed & inadequate)  Balance Balance Balance Assessed: Yes Static Sitting Balance Static Sitting - Balance  Support: Feet supported;No upper extremity supported Static Sitting - Level of Assistance: 5: Stand by assistance Dynamic Sitting Balance Dynamic Sitting - Balance Support: Feet supported;During functional activity Dynamic Sitting - Level of Assistance: 4: Min assist (CGA) Dynamic Sitting - Balance Activities: Reaching for objects Sitting balance - Comments: seems to be using BUE to offload L hip, pt leaning slightly to R side EOB/chair Static Standing Balance Static Standing - Balance Support: During functional activity;Bilateral upper extremity supported Static Standing - Level of Assistance: 4: Min assist Dynamic Standing Balance Dynamic Standing - Balance Support: During functional activity Dynamic Standing - Level of Assistance: 4: Min assist;3: Mod assist Dynamic Standing - Balance Activities: Reaching for objects Extremity/Trunk Assessment RUE Assessment RUE Assessment: Within Functional Limits LUE Assessment LUE Assessment: Within Functional Limits  Care Tool Care Tool Self Care Eating   Eating Assist Level: Supervision/Verbal cueing    Oral Care    Oral Care Assist Level: Supervision/Verbal cueing    Bathing         Assist Level: Moderate Assistance - Patient 50 - 74%    Upper Body Dressing(including orthotics)       Assist Level: Maximal Assistance - Patient 25 - 49%    Lower Body Dressing (excluding footwear)     Assist for lower body dressing: Total Assistance - Patient < 25%    Putting on/Taking off footwear     Assist for footwear: Total Assistance - Patient < 25%       Care Tool Toileting Toileting activity   Assist for toileting: Maximal Assistance - Patient 25 - 49%     Care Tool Bed Mobility Roll left and right activity   Roll left and right assist level: Moderate Assistance - Patient 50 - 74%    Sit to lying activity   Sit to lying assist level: Moderate Assistance - Patient 50 - 74%    Lying to sitting on side of bed activity   Lying to  sitting on side of bed assist level: the ability to move from lying on the back to sitting on the side of the bed with no back support.: Moderate Assistance - Patient 50 - 74%     Care Tool Transfers Sit to stand transfer   Sit to stand assist level: Moderate Assistance - Patient 50 - 74%    Chair/bed transfer   Chair/bed transfer assist level: Moderate Assistance - Patient 50 - 74%     Toilet transfer   Assist Level: Moderate Assistance - Patient 50 - 74%     Care Tool Cognition  Expression of Ideas and Wants Expression of Ideas and Wants: 3. Some difficulty - exhibits some difficulty with expressing needs and ideas (e.g, some words or finishing thoughts) or speech is not clear  Understanding Verbal and Non-Verbal Content Understanding Verbal and Non-Verbal Content: 3. Usually understands - understands most conversations, but misses some part/intent of message. Requires cues at  times to understand   Memory/Recall Ability Memory/Recall Ability : Current season;Staff names and faces;That he or she is in a hospital/hospital unit   Refer to Care Plan for Long Term Goals  SHORT TERM GOAL WEEK 1 OT Short Term Goal 1 (Week 1): Pt will complete toilet transfer with Min A consistently with LRAD OT Short Term Goal 2 (Week 1): Pt will complete grooming in standing at Min A with LRAD OT Short Term Goal 3 (Week 1): Pt will complete task in standing at Min A with LRAD for at least 2 min  Recommendations for other services: Therapeutic Recreation  Pet therapy   Skilled Therapeutic Intervention ADL ADL Eating: Set up Grooming: Minimal assistance Where Assessed-Grooming: Sitting at sink Upper Body Bathing: Moderate assistance Where Assessed-Upper Body Bathing: Shower Lower Body Bathing: Moderate assistance Where Assessed-Lower Body Bathing: Shower Upper Body Dressing: Moderate assistance Where Assessed-Upper Body Dressing: Chair Lower Body Dressing: Dependent Where Assessed-Lower Body  Dressing: Standing at sink;Sitting at sink Toileting: Maximal assistance Where Assessed-Toileting: Toilet;Bedside Commode Toilet Transfer: Moderate assistance Tub/Shower Transfer: Moderate assistance Tub/Shower Transfer Method: Stand pivot Tub/Shower Equipment: Walk in shower;Grab bars;Transfer tub Doctor, general practice: Moderate assistance Film/video editor Method: Stand pivot ADL Comments: shower was in second session, increased difficulty with transfers in afternoon d/t increased pain and difficulty with advancing RLE Mobility  Bed Mobility Bed Mobility: Supine to Sit;Sit to Supine Supine to Sit: Moderate Assistance - Patient 50-74% Sit to Supine: Moderate Assistance - Patient 50-74% Transfers Sit to Stand: Moderate Assistance - Patient 50-74% Stand to Sit: Moderate Assistance - Patient 50-74%  Session 1 1:1 evaluation and treatment session initiated this date. OT roles, goals and purpose discussed with pt as well as therapy schedule. ADL completed this date with levels of assist listed above. Pt with Min A stand step pivot transfers in this session with decreased pain noted. VC required for RW /transfer safety.  OT asked Mercedes PA about O2, PA noted it is d/t COPD diagnosis, verbal order able to wean for D/C prep, keep above 93%. Pt would benefit from skilled OT in IPR setting in order to maximize independence with ADLs upon D/C.   Session 2 General: Pt seated in W/C upon OT arrival, agreeable to OT.  Vital Signs: bathing without O2, vitals stable throughout session.  Pain:  8/10 pain reported in LLE, bathing, warm water activity, intermittent rest breaks, distractions provided for pain management, pt reports tolerable to proceed.   ADL: OT providing skilled intervention on ADL retraining in order to increase independence with tasks and increase activity tolerance. Pt completed the following tasks at above level of assist although with decreased transfer status at  now a Mod A with decreased ability to weight bear on LLE d/t pain, limiting advancement of Rt foot. Pt requiring VC for sequencing task d/t decreased ability to complete this session. Pt requiring increased time to plan/process for ADL tasks and word finding. OT providing cues to wash Lt side of hair. OT issuing long handle sponge for increased independence.   Pt seated in W/C at end of session with W/C alarm donned, call light within reach and 4Ps assessed.     Discharge Criteria: Patient will be discharged from OT if patient refuses treatment 3 consecutive times without medical reason, if treatment goals not met, if there is a change in medical status, if patient makes no progress towards goals or if patient is discharged from hospital.  The above assessment, treatment plan, treatment alternatives and  goals were discussed and mutually agreed upon: by patient  Camie Hoe, OTD, OTR/L 11/06/2023, 4:38 PM

## 2023-11-06 NOTE — Plan of Care (Signed)
  Problem: RH Balance Goal: LTG Patient will maintain dynamic standing with ADLs (OT) Description: LTG:  Patient will maintain dynamic standing balance with assist during activities of daily living (OT)  Flowsheets (Taken 11/06/2023 1255) LTG: Pt will maintain dynamic standing balance during ADLs with: Supervision/Verbal cueing   Problem: Sit to Stand Goal: LTG:  Patient will perform sit to stand in prep for activites of daily living with assistance level (OT) Description: LTG:  Patient will perform sit to stand in prep for activites of daily living with assistance level (OT) Flowsheets (Taken 11/06/2023 1255) LTG: PT will perform sit to stand in prep for activites of daily living with assistance level: Supervision/Verbal cueing   Problem: RH Grooming Goal: LTG Patient will perform grooming w/assist,cues/equip (OT) Description: LTG: Patient will perform grooming with assist, with/without cues using equipment (OT) Flowsheets (Taken 11/06/2023 1255) LTG: Pt will perform grooming with assistance level of: Supervision/Verbal cueing   Problem: RH Dressing Goal: LTG Patient will perform upper body dressing (OT) Description: LTG Patient will perform upper body dressing with assist, with/without cues (OT). Flowsheets (Taken 11/06/2023 1255) LTG: Pt will perform upper body dressing with assistance level of: Minimal Assistance - Patient > 75%   Problem: RH Toileting Goal: LTG Patient will perform toileting task (3/3 steps) with assistance level (OT) Description: LTG: Patient will perform toileting task (3/3 steps) with assistance level (OT)  Flowsheets (Taken 11/06/2023 1255) LTG: Pt will perform toileting task (3/3 steps) with assistance level: Supervision/Verbal cueing   Problem: RH Toilet Transfers Goal: LTG Patient will perform toilet transfers w/assist (OT) Description: LTG: Patient will perform toilet transfers with assist, with/without cues using equipment (OT) Flowsheets (Taken 11/06/2023  1255) LTG: Pt will perform toilet transfers with assistance level of: Supervision/Verbal cueing

## 2023-11-06 NOTE — Progress Notes (Incomplete)
 PROGRESS NOTE   Subjective/Complaints:  No acute complaints.  No events overnight. Vitals are stable overnight Labs significant for stable BMP, leukocytosis downtrending, hemoglobin down from 13.4 but stable from prior 11.7. Eating well, continent of bowel bladder overnight, had small bowel movement 6-21  ROS: Denies fevers, chills, N/V, abdominal pain, constipation, diarrhea, SOB, cough, chest pain, new weakness or paraesthesias.    Objective:   DG CHEST PORT 1 VIEW Result Date: 11/04/2023 CLINICAL DATA:  Hypoxia EXAM: PORTABLE CHEST 1 VIEW COMPARISON:  Chest x-ray 10/31/2023 FINDINGS: Loop recorder device overlies the left chest. Cardiomediastinal silhouette is within normal limits. Multifocal reticular opacities and mild patchy right upper lobe opacities appear unchanged from the prior study. There is no pleural effusion or pneumothorax. IMPRESSION: 1. Chronic appearing lung disease. Patchy right upper lobe opacities may represent acute infection. Electronically Signed   By: Greig Pique M.D.   On: 11/04/2023 17:13   Recent Labs    11/04/23 0325  WBC 10.6*  HGB 11.1*  HCT 33.9*  PLT 242   Recent Labs    11/04/23 0325  NA 137  K 3.6  CL 104  CO2 26  GLUCOSE 89  BUN 19  CREATININE 0.79  CALCIUM  8.3*    Intake/Output Summary (Last 24 hours) at 11/06/2023 1148 Last data filed at 11/06/2023 1010 Gross per 24 hour  Intake 568 ml  Output 350 ml  Net 218 ml        Physical Exam: Vital Signs Blood pressure (!) 128/97, pulse 93, temperature 99.3 F (37.4 C), temperature source Oral, resp. rate 15, height 5' 7 (1.702 m), weight 62.4 kg, SpO2 (!) 89%.  Constitution: Appropriate appearance for age. No apparent distress  Resp: No respiratory distress. No accessory muscle usage. CTAB and on 1 L [place] Cardio: Well perfused appearance.  Giller rate and rhythm trace edema along left hip. Abdomen: Nondistended. Nontender.   Positive bowel sounds, normal active Psych: Appropriate mood and affect. Skin: Surgical sites covered in Mepilex; no drainage   Neuro: AAOx4.  Mild cognitive/memory deficits.  DTRs: Reflexes were 2+ in bilateral achilles, patella, biceps, BR and triceps. Babinsky: flexor responses b/l.   Hoffmans: negative b/l Sensory exam: revealed normal sensation in all dermatomal regions in bilateral upper extremities and bilateral lower extremities Motor exam: strength 5/5 throughout bilateral upper extremities, right lower extremity, and with exception of left lower extremity 1/5 hip flexion, 2/5 knee extension, 4/5 dorsiflexion and plantarflexion Coordination: Fine motor coordination was normal.             Assessment/Plan: 1. Functional deficits which require 3+ hours per day of interdisciplinary therapy in a comprehensive inpatient rehab setting. Physiatrist is providing close team supervision and 24 hour management of active medical problems listed below. Physiatrist and rehab team continue to assess barriers to discharge/monitor patient progress toward functional and medical goals  Care Tool:  Bathing              Bathing assist       Upper Body Dressing/Undressing Upper body dressing        Upper body assist      Lower Body Dressing/Undressing Lower body dressing  Lower body assist       Toileting Toileting    Toileting assist       Transfers Chair/bed transfer  Transfers assist           Locomotion Ambulation   Ambulation assist              Walk 10 feet activity   Assist           Walk 50 feet activity   Assist           Walk 150 feet activity   Assist           Walk 10 feet on uneven surface  activity   Assist           Wheelchair     Assist               Wheelchair 50 feet with 2 turns activity    Assist            Wheelchair 150 feet activity     Assist           Blood pressure (!) 128/97, pulse 93, temperature 99.3 F (37.4 C), temperature source Oral, resp. rate 15, height 5' 7 (1.702 m), weight 62.4 kg, SpO2 (!) 89%.  Medical Problem List and Plan: 1. Functional deficits secondary to left three-part intertrochanteric hip fracture with subsequent IM nailing 11/01/2023.  Weightbearing as tolerated             -patient may shower with incisions covered             -ELOS/Goals: 10 to 14 days, supervision PT/OT             - Able for inpatient rehab admission 2.  Antithrombotics: -DVT/anticoagulation:  Mechanical: Antiembolism stockings, thigh (TED hose) Bilateral lower extremities.  Check vascular study             -antiplatelet therapy: Plavix  75 mg daily 3. Pain Management: Robaxin  as needed, discontinue IM Toradol  and begin tramadol 4. Mood/Behavior/Sleep: Provide emotional support             -antipsychotic agents: Seroquel  25 mg nightly 5. Neuropsych/cognition: This patient is not capable of making decisions on his own behalf. 6. Skin/Wound Care: Routine skin checks 7. Fluids/Electrolytes/Nutrition: Routine in and outs with follow-up chemistries 8.  History of right thalamic infarction.  Received CIR.  Continue Plavix  9.  BPH.  Flomax  0.4 mg daily check PVRs 10.  Hypertension.  Monitor with increased mobility.  Benicar currently on hold due to some soft blood pressures and resume as needed 11.  Crestor  20 mg daily    LOS: 1 days A FACE TO FACE EVALUATION WAS PERFORMED  Samuel Mata Likes 11/06/2023, 11:48 AM

## 2023-11-07 ENCOUNTER — Inpatient Hospital Stay (HOSPITAL_COMMUNITY)

## 2023-11-07 DIAGNOSIS — Z8673 Personal history of transient ischemic attack (TIA), and cerebral infarction without residual deficits: Secondary | ICD-10-CM

## 2023-11-07 LAB — COMPREHENSIVE METABOLIC PANEL WITH GFR
ALT: 8 U/L (ref 0–44)
AST: 16 U/L (ref 15–41)
Albumin: 2.2 g/dL — ABNORMAL LOW (ref 3.5–5.0)
Alkaline Phosphatase: 37 U/L — ABNORMAL LOW (ref 38–126)
Anion gap: 6 (ref 5–15)
BUN: 25 mg/dL — ABNORMAL HIGH (ref 8–23)
CO2: 25 mmol/L (ref 22–32)
Calcium: 8.5 mg/dL — ABNORMAL LOW (ref 8.9–10.3)
Chloride: 105 mmol/L (ref 98–111)
Creatinine, Ser: 0.78 mg/dL (ref 0.61–1.24)
GFR, Estimated: >60 mL/min (ref >60)
Glucose, Bld: 111 mg/dL — ABNORMAL HIGH (ref 70–99)
Potassium: 4.2 mmol/L (ref 3.5–5.1)
Sodium: 136 mmol/L (ref 135–145)
Total Bilirubin: 0.9 mg/dL (ref 0.0–1.2)
Total Protein: 5.5 g/dL — ABNORMAL LOW (ref 6.5–8.1)

## 2023-11-07 LAB — CBC WITH DIFFERENTIAL/PLATELET
Abs Immature Granulocytes: 0.06 10*3/uL (ref 0.00–0.07)
Basophils Absolute: 0 10*3/uL (ref 0.0–0.1)
Basophils Relative: 0 %
Eosinophils Absolute: 0.5 10*3/uL (ref 0.0–0.5)
Eosinophils Relative: 6 %
HCT: 33 % — ABNORMAL LOW (ref 39.0–52.0)
Hemoglobin: 10.8 g/dL — ABNORMAL LOW (ref 13.0–17.0)
Immature Granulocytes: 1 %
Lymphocytes Relative: 14 %
Lymphs Abs: 1.3 10*3/uL (ref 0.7–4.0)
MCH: 31.4 pg (ref 26.0–34.0)
MCHC: 32.7 g/dL (ref 30.0–36.0)
MCV: 95.9 fL (ref 80.0–100.0)
Monocytes Absolute: 1.6 10*3/uL — ABNORMAL HIGH (ref 0.1–1.0)
Monocytes Relative: 17 %
Neutro Abs: 5.7 10*3/uL (ref 1.7–7.7)
Neutrophils Relative %: 62 %
Platelets: 318 10*3/uL (ref 150–400)
RBC: 3.44 MIL/uL — ABNORMAL LOW (ref 4.22–5.81)
RDW: 13.1 % (ref 11.5–15.5)
WBC: 9.1 10*3/uL (ref 4.0–10.5)
nRBC: 0 % (ref 0.0–0.2)

## 2023-11-07 NOTE — Progress Notes (Signed)
 Inpatient Rehabilitation  Patient information reviewed and entered into eRehab system by [facility]. Karen Kays., CCC/SLP, PPS Coordinator.  Information including medical coding, functional ability and quality indicators will be reviewed and updated through discharge.

## 2023-11-07 NOTE — Progress Notes (Signed)
 Patient ID: Samuel Mata, male   DOB: 07-03-1930, 89 y.o.   MRN: [id number]  This SW covering for primary SW Becky Dupree.   9070- SW called pt dtr [name] to follow-up about reported SNF placement. She shares that they will likely consider SNF/ALF. SW explained SNF placement process in length reiterating requires insurance approval. SW informed will put SNF list in room. She was informed on ELOS 14-21 days and assigned RN Rhoda will follow-up with updates after team conference.   *SW left SNF list in room.   Graeme Jude, MSW, LCSW Office: [phone] Cell: [phone] Fax: [phone]

## 2023-11-07 NOTE — Plan of Care (Signed)
  Problem: Consults Goal: RH GENERAL PATIENT EDUCATION Description: See Patient Education module for education specifics. Outcome: Progressing   Problem: RH BOWEL ELIMINATION Goal: RH STG MANAGE BOWEL WITH ASSISTANCE Description: STG Manage Bowel with toileting Assistance. Outcome: Progressing   Problem: RH BLADDER ELIMINATION Goal: RH STG MANAGE BLADDER WITH ASSISTANCE Description: STG Manage Bladder With toileting Assistance Outcome: Progressing   Problem: RH PAIN MANAGEMENT Goal: RH STG PAIN MANAGED AT OR BELOW [name] PAIN GOAL Description: < 4 with PRNs Outcome: Progressing

## 2023-11-07 NOTE — Progress Notes (Signed)
 Inpatient Rehabilitation Care Coordinator Assessment and Plan Patient Details  Name: Samuel Mata MRN: [id number] Date of Birth: 09-06-1930  Today's Date: 11/07/2023  Hospital Problems: Principal Problem:   Intertrochanteric fracture of left hip [facility])  Past Medical History:  Past Medical History:  Diagnosis Date   Bronchiectasis without complication (HCC)    Enlarged prostate    Hearing loss    History of kidney stones    Hypertension    Pulmonary nocardiosis (HCC) 2021   treated with 6 month antibiotic course   Stroke (HCC) 2022   left frontoparietal   TIA (transient ischemic attack) 2019   Past Surgical History:  Past Surgical History:  Procedure Laterality Date   APPENDECTOMY     CHOLECYSTECTOMY     INTRAMEDULLARY (IM) NAIL INTERTROCHANTERIC Left 11/01/2023   Procedure: FIXATION, FRACTURE, INTERTROCHANTERIC, WITH INTRAMEDULLARY ROD;  Surgeon: Celena Sharper, MD;  Location: [facility] OR;  Service: Orthopedics;  Laterality: Left;   LOOP RECORDER INSERTION N/A 01/15/2021   Procedure: LOOP RECORDER INSERTION;  Surgeon: Kelsie Agent, MD;  Location: [facility] INVASIVE CV LAB;  Service: Cardiovascular;  Laterality: N/A;   NOSE SURGERY     SPINE SURGERY     compressoin fx T12, kyphoplasty   SPLENECTOMY, TOTAL     Social History:  reports that he quit smoking about 39 years ago. His smoking use included cigarettes. He started smoking about 69 years ago. He has a 30 pack-year smoking history. He has never used smokeless tobacco. He reports that he does not drink alcohol  and does not use drugs.  Family / Support Systems Marital Status: Married How Long?: 62 years Patient Roles: Spouse, Parent Spouse/Significant Other: Gaetana (wife) Children: 2 adult dtrs-Camille Youth worker; currently out of the country) and Armed forces training and education officer (lives in [place] but here in [place] while sister is out of the country) Other Supports: none reported Anticipated Caregiver: See below Ability/Limitations of Caregiver: hired caregivers  likely if not ALF or SNF with plan to transition to home. Caregiver Availability: 24/7 Family Dynamics: Pt lives with his wife. Dtr Lavern or [name] wi;; transport to medical appointments. Wife will drive sometimes, if not will uber or use a neighbor occassionally.  Social History Preferred language: English Religion: [employer] Cultural Background: Pt is an Nurse, adult. Retired Customer service manager. Health Literacy - How often do you need to have someone help you when you read instructions, pamphlets, or other written material from your doctor or pharmacy?: Rarely Employment Status: Retired Age Retired:  (60s) Marine scientist Issues: Denies Guardian/Conservator: HCPOA- dtr Scientist, water quality   Abuse/Neglect Abuse/Neglect Assessment Can Be Completed: Yes Physical Abuse: Denies Verbal Abuse: Denies Sexual Abuse: Denies Exploitation of patient/patient's resources: Denies Self-Neglect: Denies  Patient response to: Social Isolation - How often do you feel lonely or isolated from those around you?: Never  Emotional Status [name] affect, behavior and adjustment status: Pt is pleasant; HOH, and overall in good spirits. Recent Psychosocial Issues: Denies Psychiatric History: Denies Substance Abuse History: Denies  Patient / Family Perceptions, Expectations & Goals Pt/Family understanding of illness & functional limitations: Pt and family have a general understanding of care needs Premorbid pt/family roles/activities: INdependent Anticipated changes in roles/activities/participation: Assistance with ADLs/IADLs Pt/family expectations/goals: Pt goal is to work on getting out of here...following doctor's orders, and mobility and strength.  Community Resources [employer]: None Premorbid Home Care/DME Agencies: None Transportation available at discharge: TBD Is the patient able to respond to transportation needs?: Yes In the past 12 months, has lack of transportation kept you  from medical appointments or from getting medications?: No In the past 12 months, has lack of transportation kept you from meetings, work, or from getting things needed for daily living?: No Resource referrals recommended: Neuropsychology  Discharge Planning Living Arrangements: Spouse/significant other Support Systems: Spouse/significant other, Children Type of Residence: Private residence Insurance Resources: Media planner (specify) Administrator Medicare) Financial Resources: Restaurant manager, fast food Screen Referred: No Living Expenses: Banker Management: Spouse Does the patient have any problems obtaining your medications?: No Home Management: Wife manages all home care needs Patient/Family Preliminary Plans: No changes Care Coordinator Barriers to Discharge: Decreased caregiver support, Lack of/limited family support, Insurance for SNF coverage Care Coordinator Anticipated Follow Up Needs: HH/OP Expected length of stay: 14-21 days  Clinical Impression SW met with pt in room at bedside and dtr [name] arrived during session. Pt is pleasant and HOH. He has a goal to return to his home. DME-RW, raised toilet seat, shower bench, and transport chair.   Graeme DELENA Jude 11/07/2023, 2:33 PM

## 2023-11-07 NOTE — Progress Notes (Signed)
 PROGRESS NOTE   Subjective/Complaints:  Pt remembered me from February Daughter at bedside concerned about sedation due to medications  ROS: as per HPI. Denies CP, SOB, abd pain, N/V/D/C, or any other complaints at this time.    Objective:   No results found.  Recent Labs    11/07/23 0506  WBC 9.1  HGB 10.8*  HCT 33.0*  PLT 318   Recent Labs    11/07/23 0506  NA 136  K 4.2  CL 105  CO2 25  GLUCOSE 111*  BUN 25*  CREATININE 0.78  CALCIUM  8.5*    Urinalysis    Component Value Date/Time   COLORURINE YELLOW 11/06/2023 1648   APPEARANCEUR CLEAR 11/06/2023 1648   LABSPEC 1.019 11/06/2023 1648   PHURINE 6.0 11/06/2023 1648   GLUCOSEU NEGATIVE 11/06/2023 1648   HGBUR NEGATIVE 11/06/2023 1648   BILIRUBINUR NEGATIVE 11/06/2023 1648   BILIRUBINUR negative 12/11/2022 1225   KETONESUR NEGATIVE 11/06/2023 1648   PROTEINUR NEGATIVE 11/06/2023 1648   UROBILINOGEN 0.2 12/11/2022 1225   NITRITE NEGATIVE 11/06/2023 1648   LEUKOCYTESUR NEGATIVE 11/06/2023 1648  Amorphous crystals present No bacteria seen 6-10 RBC 0-5 squamous 11-20 WBCs    Intake/Output Summary (Last 24 hours) at 11/07/2023 0858 Last data filed at 11/07/2023 0100 Gross per 24 hour  Intake 330 ml  Output 300 ml  Net 30 ml        Physical Exam: Vital Signs Blood pressure 139/66, pulse 71, temperature 98.3 F (36.8 C), temperature source Oral, resp. rate 18, height 5' 7 (1.702 m), weight 62.4 kg, SpO2 96%.   General: No acute distress Mood and affect are appropriate Heart: Regular rate and rhythm no rubs murmurs or extra sounds Lungs: Clear to auscultation, breathing unlabored, no rales or wheezes Abdomen: Positive bowel sounds, soft nontender to palpation, nondistended Extremities: No clubbing, cyanosis, or edema Skin: No evidence of breakdown, no evidence of rash Right prox hip incision CDI Right distal lateral knee incision CDI, some  bruising and tenderness Neurologic: Cranial nerves II through XII intact, motor strength is 5/5 in bilateral deltoid, bicep, tricep, grip, right hip flexor, knee extensors, ankle dorsiflexor and plantar flexor 3-/5 Left HF, KE, 5/5 ADF  Cerebellar exam normal finger to nose to finger Musculoskeletal: Full range of motion in all 4 extremities. No joint swelling     Assessment/Plan: 1. Functional deficits which require 3+ hours per day of interdisciplinary therapy in a comprehensive inpatient rehab setting. Physiatrist is providing close team supervision and 24 hour management of active medical problems listed below. Physiatrist and rehab team continue to assess barriers to discharge/monitor patient progress toward functional and medical goals  Care Tool:  Bathing              Bathing assist Assist Level: Moderate Assistance - Patient 50 - 74%     Upper Body Dressing/Undressing Upper body dressing        Upper body assist Assist Level: Maximal Assistance - Patient 25 - 49%    Lower Body Dressing/Undressing Lower body dressing            Lower body assist Assist for lower body dressing: Total Assistance - Patient < 25%  Toileting Toileting    Toileting assist Assist for toileting: Maximal Assistance - Patient 25 - 49%     Transfers Chair/bed transfer  Transfers assist     Chair/bed transfer assist level: Moderate Assistance - Patient 50 - 74%     Locomotion Ambulation   Ambulation assist      Assist level: Minimal Assistance - Patient > 75% Assistive device: Walker-rolling Max distance: 8 ft   Walk 10 feet activity   Assist  Walk 10 feet activity did not occur: Safety/medical concerns        Walk 50 feet activity   Assist Walk 50 feet with 2 turns activity did not occur: Safety/medical concerns         Walk 150 feet activity   Assist Walk 150 feet activity did not occur: Safety/medical concerns         Walk 10 feet on  uneven surface  activity   Assist Walk 10 feet on uneven surfaces activity did not occur: Safety/medical concerns         Wheelchair     Assist Is the patient using a wheelchair?: Yes Type of Wheelchair: Manual Wheelchair activity did not occur: Safety/medical concerns         Wheelchair 50 feet with 2 turns activity    Assist    Wheelchair 50 feet with 2 turns activity did not occur: Safety/medical concerns       Wheelchair 150 feet activity     Assist  Wheelchair 150 feet activity did not occur: Safety/medical concerns       Blood pressure 139/66, pulse 71, temperature 98.3 F (36.8 C), temperature source Oral, resp. rate 18, height 5' 7 (1.702 m), weight 62.4 kg, SpO2 96%.  Medical Problem List and Plan: 1. Functional deficits secondary to left three-part intertrochanteric hip fracture with subsequent IM nailing 11/01/2023.  Weightbearing as tolerated             -patient may shower with incisions covered             -ELOS/Goals: 10 to 14 days, supervision PT/OT             -Continue CIR 2.  Antithrombotics: -DVT/anticoagulation:  Mechanical: Antiembolism stockings, thigh (TED hose) Bilateral lower extremities.  Check vascular study             -antiplatelet therapy: Plavix  75 mg daily 3. Pain Management: tylenol  and Robaxin  as needed, discontinue IM Toradol  and begin tramadol 50mg  q6h PRN, may d/c robaxin  can use tramadol more frequently  4. Mood/Behavior/Sleep: Provide emotional support -antipsychotic agents: Seroquel  25 mg nightly started inpatient d/t delirum which later resolved -11/06/23 poor sleep, declined seroquel -- will d/c , daughter wants to avoid sedating meds will start melatonin 3mg  nightly; monitor sleep 5. Neuropsych/cognition: This patient is not capable of making decisions on his own behalf. 6. Skin/Wound Care: Routine skin checks 7. Fluids/Electrolytes/Nutrition: Routine in and outs with follow-up chemistries, cont vitamins and  supplements.     Latest Ref Rng & Units 11/07/2023    5:06 AM 11/04/2023    3:25 AM 11/03/2023    3:24 AM  BMP  Glucose 70 - 99 mg/dL 888  89  896   BUN 8 - 23 mg/dL 25  19  18    Creatinine 0.61 - 1.24 mg/dL 9.21  9.20  9.00   Sodium 135 - 145 mmol/L 136  137  137   Potassium 3.5 - 5.1 mmol/L 4.2  3.6  3.4   Chloride 98 -  111 mmol/L 105  104  104   CO2 22 - 32 mmol/L 25  26  25    Calcium  8.9 - 10.3 mg/dL 8.5  8.3  8.7     8.  History of right thalamic infarction.  Received CIR.  Continue Plavix  9.  BPH.  Flomax  0.4 mg daily check PVRs 10.  Hypertension.  Monitor with increased mobility.  Benicar 5mg  daily currently on hold due to some soft blood pressures and resume as needed.   -11/06/23 BPs fine, monitor for trend Vitals:   11/05/23 1410 11/05/23 2018 11/06/23 0516 11/06/23 1254  BP: 111/62 119/63 (!) 128/97 132/77   11/06/23 2021 11/07/23 0620  BP: 123/68 139/66    11.  Crestor  20 mg daily 12. Abnormal U/A in ED: -11/06/23 pt's U/A in ED had +nitrites, 11-20 WBCs, but no culture was done. Has mild leukocytosis, which is downtrending. No other s/sx of infection.  -for completion sake, will repeat U/A with reflex UCx-- if U/A unremarkable, no further work up needed; follow U/A +/- UCx Repeat UA negative  13. Anemia: 11-12 range, monitor labs    Latest Ref Rng & Units 11/07/2023    5:06 AM 11/04/2023    3:25 AM 11/03/2023    3:24 AM  CBC  WBC 4.0 - 10.5 K/uL 9.1  10.6  13.0   Hemoglobin 13.0 - 17.0 g/dL 89.1  88.8  86.5   Hematocrit 39.0 - 52.0 % 33.0  33.9  40.5   Platelets 150 - 400 K/uL 318  242  271     14. H/o hereditary spherocytosis s/p splenectomy 15. COPD: continue nebs/bronchodilators PRN -11/06/23 appears he's now on 1L [place] O2 which is not a home requirement, CXR 6/20 similar to 6/16, not infectious appearing on my read. Lungs CTAB.   -no intervention for now  -monitor with activity, hopefully wean back to room air  -if cough develops or other s/sx of pulm issues,  repeat CXR 16. Vit D deficiency, level 8.51 on 6/18, started on 50kU weekly 17. Constipation:   -11/06/23 LBM 6/21, continue colace 100mg  BID 18. GI ppx: pepcid  20mg  daily  19.  RIght thalamic stroke 04/07/2023, at CIR from 04/12/2023 to 04/29/2023 causing LLE >LUE weakness, left neglect Last PMR office visit 07/01/23 at which time he was amb with walker and was recommended to come back on a PRN basis 20.  Prior Left frontoparietal infarct   LOS: 2 days A FACE TO FACE EVALUATION WAS PERFORMED  Prentice FORBES Compton 11/07/2023, 8:58 AM

## 2023-11-07 NOTE — Progress Notes (Signed)
 Patient ID: Samuel Mata, male   DOB: 1930-09-13, 89 y.o.   MRN: [id number] Met with the patient and dtr Kathye) to review current medical situation, rehab process, team conference and plan of care. Initially; ELOS was for 10-14 days; reviewed that the information will be updated once all of the team has completed evaluations and assesses how the patient does with therapy sessions. Dtr reported that her mother is not in good health and could only provide supervision at best. Dtr asking about SNF after CIR; information forwarded to SW.  Patient is very drowsy; currently on supplemental oxygen @ 1L/Min [place]. Continue to follow along to address educational needs to facilitate preparation for discharge. Fredericka Barnie NOVAK

## 2023-11-07 NOTE — Care Management (Signed)
 Inpatient Rehabilitation Center Individual Statement of Services  Patient Name:  Samuel Mata  Date:  11/07/2023  Welcome to the Inpatient Rehabilitation Center.  Our goal is to provide you with an individualized program based on your diagnosis and situation, designed to meet your specific needs.  With this comprehensive rehabilitation program, you will be expected to participate in at least 3 hours of rehabilitation therapies Monday-Friday, with modified therapy programming on the weekends.  Your rehabilitation program will include the following services:  Physical Therapy (PT), Occupational Therapy (OT), 24 hour per day rehabilitation nursing, Therapeutic Recreaction (TR), Psychology, Neuropsychology, Care Coordinator, Rehabilitation Medicine, Nutrition Services, Pharmacy Services, and Other  Weekly team conferences will be held on Wednesdays  to discuss your progress.  Your Inpatient Rehabilitation Care Coordinator will talk with you frequently to get your input and to update you on team discussions.  Team conferences with you and your family in attendance may also be held.  Expected length of stay: 14-21 days     Overall anticipated outcome: Supervision   Depending on your progress and recovery, your program may change. Your Inpatient Rehabilitation Care Coordinator will coordinate services and will keep you informed of any changes. Your Inpatient Rehabilitation Care Coordinator's name and contact numbers are listed  below.  The following services may also be recommended but are not provided by the Inpatient Rehabilitation Center:  Driving Evaluations Home Health Rehabiltiation Services Outpatient Rehabilitation Services Vocational Rehabilitation   Arrangements will be made to provide these services after discharge if needed.  Arrangements include referral to agencies that provide these services.  Your insurance has been verified to be:  [employer]  Your primary doctor is:   Kristin Shepperson  Pertinent information will be shared with your doctor and your insurance company.  Inpatient Rehabilitation Care Coordinator:  Rhoda Clement, KEN [phone] or ELIGAH BASQUES  Information discussed with and copy given to patient by: Graeme DELENA Jude, 11/07/2023, 10:27 AM

## 2023-11-07 NOTE — Progress Notes (Signed)
 Physical Therapy Session Note  Patient Details  Name: Samuel Mata MRN: [id number] Date of Birth: August 09, 1930  Today's Date: 11/07/2023 PT Individual Time: 1348-1430 PT Individual Time Calculation (min): 42 min   Short Term Goals: Week 1:  PT Short Term Goal 1 (Week 1): Pt will be able to perform supine to sit transfer with MI. PT Short Term Goal 2 (Week 1): Pt will be able to perform sit to supine transfer with MI. PT Short Term Goal 3 (Week 1): Pt will be able to perform STS transfer CGA. PT Short Term Goal 4 (Week 1): Pt will be able to ambulate x 60 ft with RW with PT CGA  Skilled Therapeutic Interventions/Progress Updates:     PT received semi reclined in bed and agrees to therapy. Reports pain in Lt hip and knee. PT provides rest breaks and mobility to manage pain. Pt performs supine to sit with modA and cues for sequencing and positioning. Stand step from bed to [facility] with modA and RW from elevated bed, with cues for posture, sequencing, and positioning. WC transport to gym. Pt performs sit to stand with modA, then ambulates x10' with RW and modA with cues for safe AD management, upright gaze to improve posture and balance, and lateral weight shifting, as well as promoting increased stride length on Rt to promote Lt loading during stance phase. WC transport back to room. Stand step back to bed with modA and same cues. TotalA for sit to supine for pain management. Left supine with all needs within reach.   Therapy Documentation Precautions:  Precautions Precautions: Fall Recall of Precautions/Restrictions: Impaired Precaution/Restrictions Comments: 1L O2 Restrictions Weight Bearing Restrictions Per Provider Order: Yes LLE Weight Bearing Per Provider Order: Weight bearing as tolerated   Therapy/Group: Individual Therapy  Elsie JAYSON Dawn, PT, DPT 11/07/2023, 5:07 PM

## 2023-11-08 MED ORDER — POLYETHYLENE GLYCOL 3350 17 G PO PACK
17.0000 g | PACK | Freq: Every day | ORAL | Status: DC
  Administered 2023-11-08 – 2023-11-11 (×4): 17 g via ORAL
  Filled 2023-11-08 (×4): qty 1

## 2023-11-08 MED ORDER — TRAMADOL HCL 50 MG PO TABS
25.0000 mg | ORAL_TABLET | Freq: Four times a day (QID) | ORAL | Status: DC | PRN
  Administered 2023-11-08 – 2023-11-14 (×6): 25 mg via ORAL
  Filled 2023-11-08 (×6): qty 1

## 2023-11-08 MED ORDER — SENNA 8.6 MG PO TABS: 1.0000 | ORAL_TABLET | Freq: Every day | ORAL | Status: DC

## 2023-11-08 MED ORDER — ADULT MULTIVITAMIN LIQUID CH
15.0000 mL | Freq: Every day | ORAL | Status: DC
  Administered 2023-11-09 – 2023-11-17 (×9): 15 mL via ORAL
  Filled 2023-11-08 (×12): qty 15

## 2023-11-08 MED ORDER — ELDERTONIC PO LIQD
15.0000 mL | Freq: Every day | ORAL | Status: DC
  Filled 2023-11-08: qty 15

## 2023-11-08 NOTE — Progress Notes (Signed)
 Physical Therapy Session Note  Patient Details  Name: Samuel Mata MRN: [id number] Date of Birth: 1931-01-21  Today's Date: 11/08/2023 PT Individual Time: 0903-1007 PT Individual Time Calculation (min): 64 min   Short Term Goals: Week 1:  PT Short Term Goal 1 (Week 1): Pt will be able to perform supine to sit transfer with MI. PT Short Term Goal 2 (Week 1): Pt will be able to perform sit to supine transfer with MI. PT Short Term Goal 3 (Week 1): Pt will be able to perform STS transfer CGA. PT Short Term Goal 4 (Week 1): Pt will be able to ambulate x 60 ft with RW with PT CGA  Skilled Therapeutic Interventions/Progress Updates:  Patient seated upright in w/c on entrance to room. Patient alert and agreeable to PT session. Dtr, Samuel Mata, present.  Patient with no pain complaint at start of session while resting. Does relate significant increase in pain with weight bearing.   Therapeutic Activity: Transfers: Pt performed sit<>stand and stand pivot transfers throughout session with MinA. Provided vc/ tc for technique in positioning LLE forward prior to descent to sit in order to decrease pain from hip/ knee flexion. Also cued pt for increasing muscle activity in BLE prior to descent to sit in order to improve pain during descent to sit.   W/c determined to be too low for pt at this time and taller w/c acquired with improved results in transfers.   Gait Training:  Pt ambulated 20 ft using RW with MinA and close w/c follow for pain. Demonstrated extremely antalgic gait with significant increase in pain with weight bearing to LLE. Provided vc/ tc for maintaining LLE ahead of RLE to decrease total WB but pt unable to perform throughout.  Neuromuscular Re-ed Therex: NMR facilitated during session with focus on standing balance and muscle fiber recruitment in LLE. Pt guided in static stance and weight shift forward over toes as he tends to favor a more posterior bias. Balance improves with forward  placement of hands onto RW which he requires for ambulation at this time.   Educated pt on concept of muscle activation vs activation with movement around joint. Guided in hip flexor sets to only turn on muscle but not to pick up foot from floor. Has trouble with only activation to perform isometrics initially but is able to perform by end of attempts. Also guidedin LLE LAQs x15 which he is able to perform with AAROM for hold into hip flexion. Performs 3x10 of marches and LAQs to RLE.   NMR performed for improvements in motor control and coordination, balance, sequencing, judgement, and self confidence/ efficacy in performing all aspects of mobility at highest level of independence.   Performed stand pivot transfer from w/c to L side of bed (as per home setup). Continues to demo increased pain with weight bearing to LLE but is able to follow vc for lifting LLE and pointing toe toward desired position prior to weight bearing for slight improvement in pain. Then requires time prior to providing assist to LLE into bed. Requires ModA overall for return to supine.   Patient supine in bed at end of session with brakes locked, bed alarm set, and all needs within reach. Samuel Mata, in room.    Therapy Documentation Precautions:  Precautions Precautions: Fall Recall of Precautions/Restrictions: Impaired Precaution/Restrictions Comments: 1L O2 Restrictions Weight Bearing Restrictions Per Provider Order: Yes LLE Weight Bearing Per Provider Order: Weight bearing as tolerated  Pain: Increase in pain significantly to LLE with WB  and movement activities around hip joint. Addressed with repositioning and rest.   Therapy/Group: Individual Therapy  Samuel Mata PT, DPT, CSRS 11/08/2023, 10:31 AM

## 2023-11-08 NOTE — IPOC Note (Addendum)
 Overall Plan of Care [facility]) Patient Details Name: Cobain Morici MRN: [id number] DOB: [birth year]  Admitting Diagnosis: Intertrochanteric fracture of left hip [facility])  Hospital Problems: Principal Problem:   Intertrochanteric fracture of left hip (HCC) Active Problems:   Coping style affecting medical condition     Functional Problem List: Nursing Medication Management, Safety, Bladder, Bowel, Pain, Endurance, Motor  PT Balance, Endurance, Motor, Pain, Safety  OT Balance, Safety, Cognition, Endurance, Motor, Pain, Skin Integrity  SLP    TR         Basic ADL's: OT Grooming, Dressing, Toileting     Advanced  ADL's: OT       Transfers: PT Bed Mobility, Bed to Chair, Car  OT Toilet, Tub/Shower     Locomotion: PT Ambulation, Psychologist, prison and probation services, Stairs     Additional Impairments: OT None  SLP        TR      Anticipated Outcomes Item Anticipated Outcome  Self Feeding set up  Swallowing      Basic self-care  Min A  Toileting  SBA   Bathroom Transfers SBA  Bowel/Bladder  manage bowel and bladder w mod I assist  Transfers  supervision to CGA  Locomotion  CGA ambulation  Communication     Cognition     Pain  PAin < 4 with prns  Safety/Judgment  manage safety w cues/supervision   Therapy Plan: PT Intensity: Minimum of 1-2 x/day ,45 to 90 minutes PT Frequency: Total of 15 hours over 7 days of combined therapies PT Duration Estimated Length of Stay: 21 days OT Intensity: Minimum of 1-2 x/day, 45 to 90 minutes OT Frequency: 5 out of 7 days OT Duration/Estimated Length of Stay: 14-16 days     Team Interventions: Nursing Interventions Bladder Management, Disease Management/Prevention, Medication Management, Discharge Planning, Pain Management, Bowel Management, Patient/Family Education  PT interventions Ambulation/gait training, DME/adaptive equipment instruction, Neuromuscular re-education, Stair training, UE/LE Strength taining/ROM, Wheelchair  propulsion/positioning, Therapeutic Activities, Warden/ranger, Functional mobility training, Patient/family education, Therapeutic Exercise  OT Interventions Balance/vestibular training, Discharge planning, Pain management, Self Care/advanced ADL retraining, Therapeutic Activities, UE/LE Coordination activities, Cognitive remediation/compensation, Disease mangement/prevention, Functional mobility training, Patient/family education, Skin care/wound managment, Therapeutic Exercise, Community reintegration, DME/adaptive equipment instruction, Neuromuscular re-education, Psychosocial support, Splinting/orthotics, UE/LE Strength taining/ROM, Wheelchair propulsion/positioning  SLP Interventions    TR Interventions    SW/CM Interventions Discharge Planning, Psychosocial Support, Patient/Family Education   Barriers to Discharge MD  Pain management sensitivity to sedating meds  Nursing Decreased caregiver support 1 level 1 ste no rail w spouse; can only provide supervision; HHA 1 day/week for showers  PT None    OT      SLP      SW Decreased caregiver support, Lack of/limited family support, Community education officer for SNF coverage     Team Discharge Planning: Destination: PT-Home ,OT- Home , SLP-  Projected Follow-up: PT-Home health PT, OT-  24 hour supervision/assistance, Home health OT, SLP-  Projected Equipment Needs: PT-None recommended by PT, OT- To be determined, SLP-  Equipment Details: PT-Pt states he has a 4 wheeled rolling walker and a wheelchair at home., OT-  Patient/family involved in discharge planning: PT- Patient,  OT-Patient, SLP-   MD ELOS: 14-16 d Medical Rehab Prognosis:  Fair Assessment: The patient has been admitted for CIR therapies with the diagnosis of Left IT hip fracture . The team will be addressing functional mobility, strength, stamina, balance, safety, adaptive techniques and equipment, self-care, bowel and bladder mgt, patient and caregiver education, med  management . Goals have been set at MinA/Sup. Anticipated discharge destination is home vs ALF with wife.        See Team Conference Notes for weekly updates to the plan of care

## 2023-11-08 NOTE — Progress Notes (Signed)
 PROGRESS NOTE   Subjective/Complaints:  Got choked on meds today felt like it was too may pills, took same amt as yesterday but with addition of Tylenol  650mg    ROS: as per HPI. Denies CP, SOB, abd pain, N/V/D/C, or any other complaints at this time.    Objective:   No results found.  Recent Labs    11/07/23 0506  WBC 9.1  HGB 10.8*  HCT 33.0*  PLT 318   Recent Labs    11/07/23 0506  NA 136  K 4.2  CL 105  CO2 25  GLUCOSE 111*  BUN 25*  CREATININE 0.78  CALCIUM  8.5*    Urinalysis    Component Value Date/Time   COLORURINE YELLOW 11/06/2023 1648   APPEARANCEUR CLEAR 11/06/2023 1648   LABSPEC 1.019 11/06/2023 1648   PHURINE 6.0 11/06/2023 1648   GLUCOSEU NEGATIVE 11/06/2023 1648   HGBUR NEGATIVE 11/06/2023 1648   BILIRUBINUR NEGATIVE 11/06/2023 1648   BILIRUBINUR negative 12/11/2022 1225   KETONESUR NEGATIVE 11/06/2023 1648   PROTEINUR NEGATIVE 11/06/2023 1648   UROBILINOGEN 0.2 12/11/2022 1225   NITRITE NEGATIVE 11/06/2023 1648   LEUKOCYTESUR NEGATIVE 11/06/2023 1648  Amorphous crystals present No bacteria seen 6-10 RBC 0-5 squamous 11-20 WBCs    Intake/Output Summary (Last 24 hours) at 11/08/2023 9161 Last data filed at 11/08/2023 0700 Gross per 24 hour  Intake 518 ml  Output 1000 ml  Net -482 ml        Physical Exam: Vital Signs Blood pressure (!) 163/77, pulse 82, temperature 98 F (36.7 C), resp. rate 18, height 5' 7 (1.702 m), weight 62.4 kg, SpO2 96%.   General: No acute distress Mood and affect are appropriate Heart: Regular rate and rhythm no rubs murmurs or extra sounds Lungs: Clear to auscultation, breathing unlabored, no rales or wheezes Abdomen: Positive bowel sounds, soft nontender to palpation, nondistended Extremities: No clubbing, cyanosis, or edema Skin: No evidence of breakdown, no evidence of rash Right prox hip incision CDI Right distal lateral knee incision CDI,  some bruising and tenderness Neurologic: Cranial nerves II through XII intact, motor strength is 5/5 in bilateral deltoid, bicep, tricep, grip, right hip flexor, knee extensors, ankle dorsiflexor and plantar flexor 3-/5 Left HF, KE, 5/5 ADF  Musculoskeletal: Full range of motion in all 4 extremities. No joint swelling     Assessment/Plan: 1. Functional deficits which require 3+ hours per day of interdisciplinary therapy in a comprehensive inpatient rehab setting. Physiatrist is providing close team supervision and 24 hour management of active medical problems listed below. Physiatrist and rehab team continue to assess barriers to discharge/monitor patient progress toward functional and medical goals  Care Tool:  Bathing              Bathing assist Assist Level: Moderate Assistance - Patient 50 - 74%     Upper Body Dressing/Undressing Upper body dressing        Upper body assist Assist Level: Maximal Assistance - Patient 25 - 49%    Lower Body Dressing/Undressing Lower body dressing            Lower body assist Assist for lower body dressing: Total Assistance - Patient < 25%  Toileting Toileting    Toileting assist Assist for toileting: Maximal Assistance - Patient 25 - 49%     Transfers Chair/bed transfer  Transfers assist     Chair/bed transfer assist level: Moderate Assistance - Patient 50 - 74%     Locomotion Ambulation   Ambulation assist      Assist level: Moderate Assistance - Patient 50 - 74% Assistive device: Walker-rolling Max distance: 10'   Walk 10 feet activity   Assist  Walk 10 feet activity did not occur: Safety/medical concerns  Assist level: Moderate Assistance - Patient - 50 - 74% Assistive device: Walker-rolling   Walk 50 feet activity   Assist Walk 50 feet with 2 turns activity did not occur: Safety/medical concerns         Walk 150 feet activity   Assist Walk 150 feet activity did not occur: Safety/medical  concerns         Walk 10 feet on uneven surface  activity   Assist Walk 10 feet on uneven surfaces activity did not occur: Safety/medical concerns         Wheelchair     Assist Is the patient using a wheelchair?: Yes Type of Wheelchair: Manual Wheelchair activity did not occur: Safety/medical concerns         Wheelchair 50 feet with 2 turns activity    Assist    Wheelchair 50 feet with 2 turns activity did not occur: Safety/medical concerns       Wheelchair 150 feet activity     Assist  Wheelchair 150 feet activity did not occur: Safety/medical concerns       Blood pressure (!) 163/77, pulse 82, temperature 98 F (36.7 C), resp. rate 18, height 5' 7 (1.702 m), weight 62.4 kg, SpO2 96%.  Medical Problem List and Plan: 1. Functional deficits secondary to left three-part intertrochanteric hip fracture with subsequent IM nailing 11/01/2023.  Weightbearing as tolerated             -patient may shower with incisions covered             -ELOS/Goals: 10 to 14 days, supervision PT/OT             -Continue CIR 2.  Antithrombotics: -DVT/anticoagulation:  Mechanical: Antiembolism stockings, thigh (TED hose) Bilateral lower extremities.  Check vascular study             -antiplatelet therapy: Plavix  75 mg daily 3. Pain Management: tylenol  and Robaxin  as needed, discontinue IM Toradol  and begin tramadol 50mg  q6h PRN, may d/c robaxin  can use tramadol more frequently but will reduce to 25mg  given daughter's concern about MS changes  4. Mood/Behavior/Sleep: Provide emotional support -antipsychotic agents: Seroquel  25 mg nightly started inpatient d/t delirum which later resolved -11/06/23 poor sleep, declined seroquel -- will d/c , daughter wants to avoid sedating meds will start melatonin 3mg  nightly; monitor sleep 6/24 d/c Seroquel  5. Neuropsych/cognition: This patient is not capable of making decisions on his own behalf. 6. Skin/Wound Care: Routine skin checks 7.  Fluids/Electrolytes/Nutrition: Routine in and outs with follow-up chemistries, cont vitamins and supplements.     Latest Ref Rng & Units 11/07/2023    5:06 AM 11/04/2023    3:25 AM 11/03/2023    3:24 AM  BMP  Glucose 70 - 99 mg/dL 888  89  896   BUN 8 - 23 mg/dL 25  19  18    Creatinine 0.61 - 1.24 mg/dL 9.21  9.20  9.00   Sodium 135 - 145 mmol/L 136  137  137   Potassium 3.5 - 5.1 mmol/L 4.2  3.6  3.4   Chloride 98 - 111 mmol/L 105  104  104   CO2 22 - 32 mmol/L 25  26  25    Calcium  8.9 - 10.3 mg/dL 8.5  8.3  8.7     8.  History of right thalamic infarction.  Received CIR.  Continue Plavix  9.  BPH.  Flomax  0.4 mg daily check PVRs 10.  Hypertension.  Monitor with increased mobility.  Benicar 5mg  daily currently on hold due to some soft blood pressures and resume as needed.   -11/06/23 BPs fine, monitor for trend Vitals:   11/05/23 1410 11/05/23 2018 11/06/23 0516 11/06/23 1254  BP: 111/62 119/63 (!) 128/97 132/77   11/06/23 2021 11/07/23 0620 11/07/23 1354 11/07/23 1934  BP: 123/68 139/66 103/61 (!) 143/65   11/08/23 0530  BP: (!) 163/77    11.  Crestor  20 mg daily 12. Abnormal U/A in ED: -11/06/23 pt's U/A in ED had +nitrites, 11-20 WBCs, but no culture was done. Has mild leukocytosis, which is downtrending. No other s/sx of infection.  -for completion sake, will repeat U/A with reflex UCx-- if U/A unremarkable, no further work up needed; follow U/A +/- UCx Repeat UA negative  13. Anemia: 11-12 range, monitor labs    Latest Ref Rng & Units 11/07/2023    5:06 AM 11/04/2023    3:25 AM 11/03/2023    3:24 AM  CBC  WBC 4.0 - 10.5 K/uL 9.1  10.6  13.0   Hemoglobin 13.0 - 17.0 g/dL 89.1  88.8  86.5   Hematocrit 39.0 - 52.0 % 33.0  33.9  40.5   Platelets 150 - 400 K/uL 318  242  271     14. H/o hereditary spherocytosis s/p splenectomy 15. COPD: continue nebs/bronchodilators PRN -11/06/23 appears he's now on 1L [place] O2 which is not a home requirement, CXR 6/20 similar to 6/16, not  infectious appearing on my read. Lungs CTAB.   -no intervention for now  -monitor with activity, hopefully wean back to room air  -if cough develops or other s/sx of pulm issues, repeat CXR 16. Vit D deficiency, level 8.51 on 6/18, started on 50kU weekly 17. Constipation:   -11/06/23 LBM 6/21, continue colace 100mg  BID 18. GI ppx: pepcid  20mg  daily  19.  RIght thalamic stroke 04/07/2023, at CIR from 04/12/2023 to 04/29/2023 causing LLE >LUE weakness, left neglect Last PMR office visit 07/01/23 at which time he was amb with walker and was recommended to come back on a PRN basis 20.  Prior Left frontoparietal infarct no CIR stay   LOS: 3 days A FACE TO FACE EVALUATION WAS PERFORMED  Prentice FORBES Compton 11/08/2023, 8:38 AM

## 2023-11-08 NOTE — Plan of Care (Signed)
  Problem: RH BOWEL ELIMINATION Goal: RH STG MANAGE BOWEL WITH ASSISTANCE Description: STG Manage Bowel with toileting Assistance. Outcome: Progressing Goal: RH STG MANAGE BOWEL W/MEDICATION W/ASSISTANCE Description: STG Manage Bowel with Medication with mod I Assistance. Outcome: Progressing   Problem: RH BLADDER ELIMINATION Goal: RH STG MANAGE BLADDER WITH ASSISTANCE Description: STG Manage Bladder With toileting Assistance Outcome: Progressing   Problem: RH PAIN MANAGEMENT Goal: RH STG PAIN MANAGED AT OR BELOW [name] PAIN GOAL Description: < 4 with PRNs Outcome: Progressing

## 2023-11-08 NOTE — Progress Notes (Addendum)
 Occupational Therapy Session Note  Patient Details  Name: Samuel Mata MRN: [id number] Date of Birth: [birth year]  Today's Date: 11/08/2023 OT Individual Time: 1045-1200 OT Individual Time Calculation (min): 75 min    Short Term Goals: Week 1:  OT Short Term Goal 1 (Week 1): Pt will complete toilet transfer with Min A consistently with LRAD OT Short Term Goal 2 (Week 1): Pt will complete grooming in standing at Min A with LRAD OT Short Term Goal 3 (Week 1): Pt will complete task in standing at Min A with LRAD for at least 2 min  Skilled Therapeutic Interventions/Progress Updates:     Pt received in bed ready for therapy.  Focus of therapy session on functional mobility.      ADL Retraining: -pt requested to toilet,  sat on toilet to urinate, managed clothing in standing with CGA -assisted pt with donning knee high TED hose and L slip on shoe  Therapeutic Activity/ Exercise: -UE strength with 2 lb bar for shoulder presses,  band around bar for resisted pulls for back strength, and yellow band tricep extension  Transfers: -sat to EOB with mod A to help move LLE  to avoid inducing pain  -CGA to rise to stand,   min A with RW with cues to push down with hands on RW to take pressure off of L leg and to keep walker close to him -occasional cues with shifting walker when pt needed to rotate to turn to chair' -pt clearly in pain with any weight bearing on LLE. Limited the ambulation to only in and out of bathroom of 15 ft x2 -discussed with pt and RN about him taking pain medication prior to afternoon PT session    Balance: -static stand with RW support supervison, CGA with stand to manage clothing during toileting  Pt participated extremely well and once his L leg pain is reduces he will likely have much stronger mobility skills. Pt on room air during session with O2 sats at 95% or higher even after increased effort with walking.    Pt resting in recliner with all needs met. Alarm set  and call light in reach.     Therapy Documentation Precautions:  Precautions Precautions: Fall Restrictions Weight Bearing Restrictions Per Provider Order: Yes LLE Weight Bearing Per Provider Order: Weight bearing as tolerated    Vital Signs: Therapy Vitals Temp: 98 F (36.7 C) Pulse Rate: 82 Resp: 18 BP: (!) 163/77 Patient Position (if appropriate): Lying Pain: Pain Assessment Pain Scale: 0-10 Pain Score: 5  Pain Location: Hip Pain Intervention(s): Medication (See eMAR)    Therapy/Group: Individual Therapy  Tationna Fullard 11/08/2023, 8:53 AM

## 2023-11-09 DIAGNOSIS — G8918 Other acute postprocedural pain: Secondary | ICD-10-CM

## 2023-11-09 DIAGNOSIS — D62 Acute posthemorrhagic anemia: Secondary | ICD-10-CM

## 2023-11-09 MED ORDER — ACETAMINOPHEN 325 MG PO TABS
650.0000 mg | ORAL_TABLET | Freq: Three times a day (TID) | ORAL | Status: DC
  Administered 2023-11-09 – 2023-11-23 (×50): 650 mg via ORAL
  Filled 2023-11-09 (×56): qty 2

## 2023-11-09 NOTE — Progress Notes (Signed)
 Physical Therapy Session Note  Patient Details  Name: Samuel Mata MRN: [id number] Date of Birth: 12/19/1930  Today's Date: 11/09/2023 PT Individual Time:  -      Short Term Goals: Week 1:  PT Short Term Goal 1 (Week 1): Pt will be able to perform supine to sit transfer with MI. PT Short Term Goal 2 (Week 1): Pt will be able to perform sit to supine transfer with MI. PT Short Term Goal 3 (Week 1): Pt will be able to perform STS transfer CGA. PT Short Term Goal 4 (Week 1): Pt will be able to ambulate x 60 ft with RW with PT CGA  Skilled Therapeutic Interventions/Progress Updates:  Patient *** on entrance to room. Patient alert and agreeable to PT session.   Patient with no pain complaint at start of session.  Therapeutic Activity: Bed Mobility: Pt performed supine <> sit with ***. VC/ tc required for ***. Transfers: Pt performed sit<>stand and stand pivot transfers throughout session with ***. Provided vc/ tc for***.  Gait Training:  Pt ambulated *** ft using *** with ***. Demonstrated ***. Provided vc/ tc for ***.  Wheelchair Mobility:  Pt propelled wheelchair *** feet with ***. Provided vc/ tc for ***.  Neuromuscular Re-ed: NMR facilitated during session with focus on ***. Pt guided in ***. NMR performed for improvements in motor control and coordination, balance, sequencing, judgement, and self confidence/ efficacy in performing all aspects of mobility at highest level of independence.   Therapeutic Exercise: Pt performed the following exercises with vc/ tc for proper technique. ***  Patient *** at end of session with brakes locked, *** alarm set, and all needs within reach.   Therapy Documentation Precautions:  Precautions Precautions: Fall Recall of Precautions/Restrictions: Intact Precaution/Restrictions Comments: pt able to tolerate activity on room air Restrictions Weight Bearing Restrictions Per Provider Order: Yes LLE Weight Bearing Per Provider Order: Weight  bearing as tolerated  Pain:     Therapy/Group: Individual Therapy  [name] PT, DPT, CSRS 11/09/2023, 6:36 AM

## 2023-11-09 NOTE — Progress Notes (Signed)
 Physical Therapy Session Note  Patient Details  Name: Samuel Mata MRN: [id number] Date of Birth: 27-Sep-1930  {CHL IP REHAB PT TIME CALCULATION:304800500}  Short Term Goals: {DUH:6958314}  Skilled Therapeutic Interventions/Progress Updates:      Therapy Documentation Precautions:  Precautions Precautions: Fall Recall of Precautions/Restrictions: Intact Precaution/Restrictions Comments: pt able to tolerate activity on room air Restrictions Weight Bearing Restrictions Per Provider Order: Yes LLE Weight Bearing Per Provider Order: Weight bearing as tolerated General:   Vital Signs:   Pain: Pain Assessment Pain Scale: 0-10 Pain Score: 4  Pain Location: Hip Pain Intervention(s): Medication (See eMAR) Mobility:   Locomotion :    Trunk/Postural Assessment :    Balance:   Exercises:   Other Treatments:      Therapy/Group: {Therapy/Group:[id number]}  [name] 11/09/2023, 1:24 PM

## 2023-11-09 NOTE — Progress Notes (Addendum)
 PROGRESS NOTE   Subjective/Complaints:  Per PT , pain is inhibiting therapy, pt not getting meds on a regular basis prior to PT since there is little pain at rest   ROS: as per HPI. Denies CP, SOB, abd pain, N/V/D/C, or any other complaints at this time.    Objective:   No results found.  Recent Labs    11/07/23 0506  WBC 9.1  HGB 10.8*  HCT 33.0*  PLT 318   Recent Labs    11/07/23 0506  NA 136  K 4.2  CL 105  CO2 25  GLUCOSE 111*  BUN 25*  CREATININE 0.78  CALCIUM  8.5*    Urinalysis    Component Value Date/Time   COLORURINE YELLOW 11/06/2023 1648   APPEARANCEUR CLEAR 11/06/2023 1648   LABSPEC 1.019 11/06/2023 1648   PHURINE 6.0 11/06/2023 1648   GLUCOSEU NEGATIVE 11/06/2023 1648   HGBUR NEGATIVE 11/06/2023 1648   BILIRUBINUR NEGATIVE 11/06/2023 1648   BILIRUBINUR negative 12/11/2022 1225   KETONESUR NEGATIVE 11/06/2023 1648   PROTEINUR NEGATIVE 11/06/2023 1648   UROBILINOGEN 0.2 12/11/2022 1225   NITRITE NEGATIVE 11/06/2023 1648   LEUKOCYTESUR NEGATIVE 11/06/2023 1648  Amorphous crystals present No bacteria seen 6-10 RBC 0-5 squamous 11-20 WBCs    Intake/Output Summary (Last 24 hours) at 11/09/2023 0851 Last data filed at 11/09/2023 0324 Gross per 24 hour  Intake --  Output 400 ml  Net -400 ml        Physical Exam: Vital Signs Blood pressure (!) 152/60, pulse 78, temperature 98.1 F (36.7 C), temperature source Oral, resp. rate 18, height 5' 7 (1.702 m), weight 62.4 kg, SpO2 94%.   General: No acute distress Mood and affect are appropriate Heart: Regular rate and rhythm no rubs murmurs or extra sounds Lungs: Clear to auscultation, breathing unlabored, no rales or wheezes Abdomen: Positive bowel sounds, soft nontender to palpation, nondistended Extremities: No clubbing, cyanosis, or edema Skin: No evidence of breakdown, no evidence of rash Right prox hip incision CDI Right distal  lateral knee incision CDI, some bruising and tenderness Neurologic: Cranial nerves II through XII intact, motor strength is 5/5 in bilateral deltoid, bicep, tricep, grip, right hip flexor, knee extensors, ankle dorsiflexor and plantar flexor 3-/5 Left HF, KE, 5/5 ADF  Musculoskeletal: Full range of motion in all 4 extremities. No joint swelling     Assessment/Plan: 1. Functional deficits which require 3+ hours per day of interdisciplinary therapy in a comprehensive inpatient rehab setting. Physiatrist is providing close team supervision and 24 hour management of active medical problems listed below. Physiatrist and rehab team continue to assess barriers to discharge/monitor patient progress toward functional and medical goals  Care Tool:  Bathing              Bathing assist Assist Level: Moderate Assistance - Patient 50 - 74%     Upper Body Dressing/Undressing Upper body dressing        Upper body assist Assist Level: Maximal Assistance - Patient 25 - 49%    Lower Body Dressing/Undressing Lower body dressing            Lower body assist Assist for lower body dressing: Total Assistance -  Patient < 25%     Toileting Toileting    Toileting assist Assist for toileting: Minimal Assistance - Patient > 75%     Transfers Chair/bed transfer  Transfers assist     Chair/bed transfer assist level: Moderate Assistance - Patient 50 - 74%     Locomotion Ambulation   Ambulation assist      Assist level: Moderate Assistance - Patient 50 - 74% Assistive device: Walker-rolling Max distance: 10'   Walk 10 feet activity   Assist  Walk 10 feet activity did not occur: Safety/medical concerns  Assist level: Moderate Assistance - Patient - 50 - 74% Assistive device: Walker-rolling   Walk 50 feet activity   Assist Walk 50 feet with 2 turns activity did not occur: Safety/medical concerns         Walk 150 feet activity   Assist Walk 150 feet activity did  not occur: Safety/medical concerns         Walk 10 feet on uneven surface  activity   Assist Walk 10 feet on uneven surfaces activity did not occur: Safety/medical concerns         Wheelchair     Assist Is the patient using a wheelchair?: Yes Type of Wheelchair: Manual Wheelchair activity did not occur: Safety/medical concerns         Wheelchair 50 feet with 2 turns activity    Assist    Wheelchair 50 feet with 2 turns activity did not occur: Safety/medical concerns       Wheelchair 150 feet activity     Assist  Wheelchair 150 feet activity did not occur: Safety/medical concerns       Blood pressure (!) 152/60, pulse 78, temperature 98.1 F (36.7 C), temperature source Oral, resp. rate 18, height 5' 7 (1.702 m), weight 62.4 kg, SpO2 94%.  Medical Problem List and Plan: 1. Functional deficits secondary to left three-part intertrochanteric hip fracture with subsequent IM nailing 11/01/2023.  Weightbearing as tolerated             -patient may shower with incisions covered             -ELOS/Goals: 7/9, supervision PT/OT Team conference today please see physician documentation under team conference tab, met with team  to discuss problems,progress, and goals. Formulized individual treatment plan based on medical history, underlying problem and comorbidities.  2.  Antithrombotics: -DVT/anticoagulation:  Mechanical: Antiembolism stockings, thigh (TED hose) Bilateral lower extremities.  Check vascular study             -antiplatelet therapy: Plavix  75 mg daily 3. Pain Management: tylenol  and Robaxin  as needed, discontinue IM Toradol  and begin tramadol 50mg  q6h PRN, may d/c robaxin  can use tramadol more frequently but will reduce to 25mg  given daughter's concern about MS changes  Will try scheduling acetaminophen  but may require addition of tramadol  Other options include Butrans patch but will try above first  4. Mood/Behavior/Sleep: Provide emotional  support -antipsychotic agents: Seroquel  25 mg nightly started inpatient d/t delirum which later resolved -11/06/23 poor sleep, declined seroquel -- will d/c , daughter wants to avoid sedating meds will start melatonin 3mg  nightly; monitor sleep 6/24 d/c Seroquel  5. Neuropsych/cognition: This patient is not capable of making decisions on his own behalf. 6. Skin/Wound Care: Routine skin checks 7. Fluids/Electrolytes/Nutrition: Routine in and outs with follow-up chemistries, cont vitamins and supplements.     Latest Ref Rng & Units 11/07/2023    5:06 AM 11/04/2023    3:25 AM 11/03/2023  3:24 AM  BMP  Glucose 70 - 99 mg/dL 888  89  896   BUN 8 - 23 mg/dL 25  19  18    Creatinine 0.61 - 1.24 mg/dL 9.21  9.20  9.00   Sodium 135 - 145 mmol/L 136  137  137   Potassium 3.5 - 5.1 mmol/L 4.2  3.6  3.4   Chloride 98 - 111 mmol/L 105  104  104   CO2 22 - 32 mmol/L 25  26  25    Calcium  8.9 - 10.3 mg/dL 8.5  8.3  8.7     8.  History of right thalamic infarction.  Received CIR.  Continue Plavix  9.  BPH.  Flomax  0.4 mg daily check PVRs 10.  Hypertension.  Monitor with increased mobility.  Benicar 5mg  daily currently on hold due to some soft blood pressures and resume as needed.    Vitals:   11/05/23 1410 11/05/23 2018 11/06/23 0516 11/06/23 1254  BP: 111/62 119/63 (!) 128/97 132/77   11/06/23 2021 11/07/23 0620 11/07/23 1354 11/07/23 1934  BP: 123/68 139/66 103/61 (!) 143/65   11/08/23 0530 11/08/23 1717 11/08/23 1944 11/09/23 0329  BP: (!) 163/77 (!) 155/79 (!) 143/94 (!) 152/60   11/09/23 BPs mildly elevated , will see if this goes down with scheduled pain medication  11.  Crestor  20 mg daily 12. Abnormal U/A in ED: -11/06/23 pt's U/A in ED had +nitrites, 11-20 WBCs, but no culture was done. Has mild leukocytosis, which is downtrending. No other s/sx of infection.  -for completion sake, will repeat U/A with reflex UCx-- if U/A unremarkable, no further work up needed; follow U/A +/- UCx Repeat UA  negative  13. Anemia: 11-12 range, monitor labs    Latest Ref Rng & Units 11/07/2023    5:06 AM 11/04/2023    3:25 AM 11/03/2023    3:24 AM  CBC  WBC 4.0 - 10.5 K/uL 9.1  10.6  13.0   Hemoglobin 13.0 - 17.0 g/dL 89.1  88.8  86.5   Hematocrit 39.0 - 52.0 % 33.0  33.9  40.5   Platelets 150 - 400 K/uL 318  242  271     14. H/o hereditary spherocytosis s/p splenectomy 15. COPD: continue nebs/bronchodilators PRN Off O2 sats ok 16. Vit D deficiency, level 8.51 on 6/18, started on 50kU weekly 17. Constipation:   -11/06/23 LBM 6/24, continue colace 100mg  BID 18. GI ppx: pepcid  20mg  daily  19.  RIght thalamic stroke 04/07/2023, at CIR from 04/12/2023 to 04/29/2023 causing LLE >LUE weakness, left neglect Last PMR office visit 07/01/23 at which time he was amb with walker and was recommended to come back on a PRN basis 20.  Prior Left frontoparietal infarct no CIR stay   LOS: 4 days A FACE TO FACE EVALUATION WAS PERFORMED  Samuel Mata 11/09/2023, 8:51 AM

## 2023-11-09 NOTE — Progress Notes (Signed)
 Patient ID: Samuel Mata, male   DOB: 04-18-1931, 89 y.o.   MRN: [id number] Met with pt, wife and son in-law who are present in room to discuss team conference goals of CGA and target discharge date of 7/7. Discussed pt's [progress and pain is limiting him I his therapies., MD is working on this. Discussed the options of home with assist, ALF or SNF. Son in-law reports they will discuss as a family and decide best option for pt and his wife. Wife was concerned he be at a level where he was only supervision level, so he would not need physical assist. Will work on best plan for all and continue to work on discharge needs.

## 2023-11-09 NOTE — Progress Notes (Signed)
 Physical Therapy Session Note  Patient Details  Name: Samuel Mata MRN: [id number] Date of Birth: [birth year]  {CHL IP REHAB PT TIME CALCULATION:304800500}  Short Term Goals: {DUH:6958314}  Skilled Therapeutic Interventions/Progress Updates:  Patient *** on entrance to room. Patient alert and agreeable to PT session.   Patient with no pain complaint at start of session.  Therapeutic Activity: Bed Mobility: Pt performed supine <> sit with ***. VC/ tc required for ***. Transfers: Pt performed sit<>stand and stand pivot transfers throughout session with ***. Provided vc/ tc for***.  Gait Training:  Pt ambulated *** ft using *** with ***. Demonstrated ***. Provided vc/ tc for ***.  Wheelchair Mobility:  Pt propelled wheelchair *** feet with ***. Provided vc/ tc for ***.  Neuromuscular Re-ed: NMR facilitated during session with focus on ***. Pt guided in ***. NMR performed for improvements in motor control and coordination, balance, sequencing, judgement, and self confidence/ efficacy in performing all aspects of mobility at highest level of independence.   Therapeutic Exercise: Pt performed the following exercises with vc/ tc for proper technique. ***  Patient *** at end of session with brakes locked, *** alarm set, and all needs within reach.   - donning thigh high TED - changing brief and pants to new  - bed mobility - pain meds from RN - MD rounding - amb from bed to recliner - setup with water, miralax , and cranberry jc - pt asleep immedietely on elevating BLE   Therapy Documentation Precautions:  Precautions Precautions: Fall Recall of Precautions/Restrictions: Intact Precaution/Restrictions Comments: pt able to tolerate activity on room air Restrictions Weight Bearing Restrictions Per Provider Order: Yes LLE Weight Bearing Per Provider Order: Weight bearing as tolerated General:   Vital Signs:   Pain:   Mobility:   Locomotion :    Trunk/Postural  Assessment :    Balance:   Exercises:   Other Treatments:      Therapy/Group: {Therapy/Group:[id number]}  [name] 11/09/2023, 8:13 AM

## 2023-11-09 NOTE — Progress Notes (Signed)
 Occupational Therapy Session Note  Patient Details  Name: Samuel Mata MRN: [id number] Date of Birth: [birth year]  Today's Date: 11/09/2023 OT Individual Time: 9048-8969 OT Individual Time Calculation (min): 39 min  OT Individual Time: 8897-8840 OT Individual Time Calculation (min): 57 min   Short Term Goals: Week 1:  OT Short Term Goal 1 (Week 1): Pt will complete toilet transfer with Min A consistently with LRAD OT Short Term Goal 2 (Week 1): Pt will complete grooming in standing at Min A with LRAD OT Short Term Goal 3 (Week 1): Pt will complete task in standing at Min A with LRAD for at least 2 min  Skilled Therapeutic Interventions/Progress Updates:     Session 1: Pt received sitting up in recliner presenting to be tired, however receptive to skilled OT session reporting 0/10 pain at rest- OT offering intermittent rest breaks, repositioning, and therapeutic support to optimize participation in therapy session. Pt reporting that he is hearing music upon OT arrival and that it is distressing him, however no music was playing in his room and TV was cut off prior to OT arrival. Closed door to minimize distractions and noise with noted improvement immediately following, however Pt did report hearing music again later during session. Pt requesting to complete grooming/hygiene tasks this AM as part of his morning routine. Engaged Pt in completing functional mobility to wc positioned at sink using RW for endurance and functional mobility training simulating ambulating short household distance. Pt able to complete ~54ft functional mobility with light min A provided for balance and RW management using step-to gait pattern. Pt hesitant to WB through his L LE d/t it causing increased pain. He chose to sit vs stand for energy conservation while brushing teeth, grooming hair, and shaving face with SUP. Pt then requesting to use restroom. Pt completed functional mobility <> bathroom using RW with min A  provided for balance and RW management. 3/3 toileting tasks completed with CGA following continent void- documented in flowsheets. Pt was unconfident in standing when bringing pants to waist requiring mod verbal cues for motivation and to alternate pulling pants up with R/L UE. Pt fatigued at end of session requesting to take a nap in wc. Pt was left resting in recliner with call bell in reach, chair alarm on, and all needs met.    Session 2: Pt received sitting up in recliner lightly sleeping, waking upon OT arrival. Pt presenting to be in good spirits receptive to skilled OT session reporting 3/10 pain in L hip- OT offering intermittent rest breaks, repositioning, and therapeutic support to optimize participation in therapy session. Focused this session on dynamic balance, standing tolerance, and increasing [name] overall confidence in standing position for increased safety in ADLs.   Engaged Pt in completing 2 bouts of functional mobility to therapy gym using RW to increase overall activity tolerance and confidence WB'ing through L LE. Pt able to complete `~29ft x2 trials with MIN A overall for balance and RW management +mod verbal cues for encouragement and trunk positioning. Pt using step-to pattern during functional mobility to prevent pain through L LE. Transported Pt remainder of the way in wc for energy conservation and time management.   Stand pivot wc > EOM using RW CGA.   Engaged Pt in completing dynamic standing balance horse shoe toss activity to work on dynamic standing balance, reactionary balance, and facilitate WB'ing through L LE. Incorporated anterior and posterior reaching into task to simulate skills required for BADLs. Pt instructed to maintain standing  balance while reaching anteriorly or posteriorly to retrieve horse shoe and throw it at target. Pt able to complete 2x8 reps with CGA-MIN A provided for balance using RW. Seated rest breaks provided following and between trials.   Pt  completed dynamic standing balance connect four activity at elevated table to work on standing tolerance, WB'ing through L LE, and confidence releasing single UE support for RW for improved ADL participation. Pt able to tolerate standing for 2 bouts of 2 minutes CGA with prolonged seated rest breaks provided between trials.   Attempted to engaged Pt in standing exercises without UE support on RW to improve dynamic standing balance. During first exercise, Pt presenting with heavy posterior lean and significant fear of falling so returned to mat table and terminated activity for moral.   Pt receptive to walking portion of the way back to his room. He completed total of 10 feet with RW using step-to pattern MIN A, however he began to report increased pain 6/10 in L LE so he was transported remainder of the way back to his room in wc.   Stand pivot wc > recliner using RW min A.   Pt was left resting in recliner with call bell in reach, chair alarm on, and all needs met.    Therapy Documentation Precautions:  Precautions Precautions: Fall Recall of Precautions/Restrictions: Intact Precaution/Restrictions Comments: pt able to tolerate activity on room air Restrictions Weight Bearing Restrictions Per Provider Order: Yes LLE Weight Bearing Per Provider Order: Weight bearing as tolerated   Therapy/Group: Individual Therapy  [name] 11/09/2023, 7:53 AM

## 2023-11-10 NOTE — Progress Notes (Signed)
 Physical Therapy Session Note  Patient Details  Name: Samuel Mata MRN: [id number] Date of Birth: 1930-07-31  Today's Date: 11/10/2023 PT Individual Time: 0900-1013 PT Individual Time Calculation (min): 73 min   Short Term Goals: Week 1:  PT Short Term Goal 1 (Week 1): Pt will be able to perform supine to sit transfer with MI. PT Short Term Goal 2 (Week 1): Pt will be able to perform sit to supine transfer with MI. PT Short Term Goal 3 (Week 1): Pt will be able to perform STS transfer CGA. PT Short Term Goal 4 (Week 1): Pt will be able to ambulate x 60 ft with RW with PT CGA  Skilled Therapeutic Interventions/Progress Updates:      Pt in bed sleeping on arrival - his son in law is present at the bedside. Patient awakens easily to voice and in agreement to therapy treatment. Pt reports no resting pain but does have moderate pain levels during treatment session. Rest breaks provided for pain management.   Donned pants at bed level with modA for threading and pulling over knees. Pt able to bridge with 1 leg (RLE) in bed to pull pants over hips.   Supine<>sitting EOB with minA using hospital bed features, assist needed for managing his LLE only. Donned shoes with totalA for time management.   Sit<>stand to RW from slightly elevated bed height with CGA with cues for hand placement. Completes stand step transfer into wheelchair with minA for lateral weight shifting and for RW management.   Transported at w/c lvl to day room rehab gym. MinA for stand pivot transfer onto Nustep. Patient completed x12 minutes at L6 resistance using BUE/BLE to facilitate AAROM for LLE and general strengthening/endurance training. Pt completed >2 METS, 0.3 miles, and >300 steps.   Gait training using RW up to 81ft with minA and RW with +2 assist for w/c follow for safety. Patient initially presenting with antalgic step-to gait pattern so worked on progressing to reciprocal stepping and increasing his stride length,  responding well to verbal cueing for stepping past your other foot.  In rehab gym, patient instructed in stair training to work on single leg stance and loading LLE for strengthening. Pt completed x1 step with 2 hand rails but needed maxA for balance recovery due to L knee buckling. So deferred further stair training but did work on single leg toe taps (RLE) on the bottom step 2x5 with PT blocking his L knee and providing stability for balance.   Pt returned to his room at end of treatment, left sitting up in w/c with chair pad alarm on and all needs within reach.   Therapy Documentation Precautions:  Precautions Precautions: Fall Recall of Precautions/Restrictions: Intact Precaution/Restrictions Comments: pt able to tolerate activity on room air Restrictions Weight Bearing Restrictions Per Provider Order: Yes LLE Weight Bearing Per Provider Order: Weight bearing as tolerated General:    Therapy/Group: Individual Therapy  Sherlean SHAUNNA Perks 11/10/2023, 7:44 AM

## 2023-11-10 NOTE — Progress Notes (Signed)
 PROGRESS NOTE   Subjective/Complaints:  Amb distance improved a little with PT yesterday , used PWB strategy  On scheduled Tylenol  received prn tramadol in am , the last 2 d ROS: as per HPI. Denies CP, SOB, abd pain, N/V/D/C, or any other complaints at this time.    Objective:   No results found.  No results for input(s): WBC, HGB, HCT, PLT in the last 72 hours.  No results for input(s): NA, K, CL, CO2, GLUCOSE, BUN, CREATININE, CALCIUM  in the last 72 hours.   Urinalysis    Component Value Date/Time   COLORURINE YELLOW 11/06/2023 1648   APPEARANCEUR CLEAR 11/06/2023 1648   LABSPEC 1.019 11/06/2023 1648   PHURINE 6.0 11/06/2023 1648   GLUCOSEU NEGATIVE 11/06/2023 1648   HGBUR NEGATIVE 11/06/2023 1648   BILIRUBINUR NEGATIVE 11/06/2023 1648   BILIRUBINUR negative 12/11/2022 1225   KETONESUR NEGATIVE 11/06/2023 1648   PROTEINUR NEGATIVE 11/06/2023 1648   UROBILINOGEN 0.2 12/11/2022 1225   NITRITE NEGATIVE 11/06/2023 1648   LEUKOCYTESUR NEGATIVE 11/06/2023 1648  Amorphous crystals present No bacteria seen 6-10 RBC 0-5 squamous 11-20 WBCs    Intake/Output Summary (Last 24 hours) at 11/10/2023 0949 Last data filed at 11/10/2023 0830 Gross per 24 hour  Intake 475 ml  Output 370 ml  Net 105 ml        Physical Exam: Vital Signs Blood pressure (!) 151/57, pulse 69, temperature 98.6 F (37 C), temperature source Oral, resp. rate 17, height 5' 7 (1.702 m), weight 62.4 kg, SpO2 91%.   General: No acute distress Mood and affect are appropriate Heart: Regular rate and rhythm no rubs murmurs or extra sounds Lungs: Clear to auscultation, breathing unlabored, no rales or wheezes Abdomen: Positive bowel sounds, soft nontender to palpation, nondistended Extremities: No clubbing, cyanosis, or edema Skin: No evidence of breakdown, no evidence of rash Right prox hip incision CDI Right distal  lateral knee incision CDI, some bruising and tenderness Neurologic: Cranial nerves II through XII intact, motor strength is 5/5 in bilateral deltoid, bicep, tricep, grip, right hip flexor, knee extensors, ankle dorsiflexor and plantar flexor 3-/5 Left HF, KE, 5/5 ADF  Musculoskeletal: Full range of motion in all 4 extremities. No joint swelling     Assessment/Plan: 1. Functional deficits which require 3+ hours per day of interdisciplinary therapy in a comprehensive inpatient rehab setting. Physiatrist is providing close team supervision and 24 hour management of active medical problems listed below. Physiatrist and rehab team continue to assess barriers to discharge/monitor patient progress toward functional and medical goals  Care Tool:  Bathing              Bathing assist Assist Level: Moderate Assistance - Patient 50 - 74%     Upper Body Dressing/Undressing Upper body dressing        Upper body assist Assist Level: Maximal Assistance - Patient 25 - 49%    Lower Body Dressing/Undressing Lower body dressing            Lower body assist Assist for lower body dressing: Total Assistance - Patient < 25%     Toileting Toileting    Toileting assist Assist for toileting: Minimal Assistance - Patient >  75%     Transfers Chair/bed transfer  Transfers assist     Chair/bed transfer assist level: Moderate Assistance - Patient 50 - 74%     Locomotion Ambulation   Ambulation assist      Assist level: Moderate Assistance - Patient 50 - 74% Assistive device: Walker-rolling Max distance: 10'   Walk 10 feet activity   Assist  Walk 10 feet activity did not occur: Safety/medical concerns  Assist level: Moderate Assistance - Patient - 50 - 74% Assistive device: Walker-rolling   Walk 50 feet activity   Assist Walk 50 feet with 2 turns activity did not occur: Safety/medical concerns         Walk 150 feet activity   Assist Walk 150 feet activity did  not occur: Safety/medical concerns         Walk 10 feet on uneven surface  activity   Assist Walk 10 feet on uneven surfaces activity did not occur: Safety/medical concerns         Wheelchair     Assist Is the patient using a wheelchair?: Yes Type of Wheelchair: Manual Wheelchair activity did not occur: Safety/medical concerns         Wheelchair 50 feet with 2 turns activity    Assist    Wheelchair 50 feet with 2 turns activity did not occur: Safety/medical concerns       Wheelchair 150 feet activity     Assist  Wheelchair 150 feet activity did not occur: Safety/medical concerns       Blood pressure (!) 151/57, pulse 69, temperature 98.6 F (37 C), temperature source Oral, resp. rate 17, height 5' 7 (1.702 m), weight 62.4 kg, SpO2 91%.  Medical Problem List and Plan: 1. Functional deficits secondary to left three-part intertrochanteric hip fracture with subsequent IM nailing 11/01/2023.  Weightbearing as tolerated             -patient may shower with incisions covered             -ELOS/Goals: 7/9, supervision PT/OT   2.  Antithrombotics: -DVT/anticoagulation:  Mechanical: Antiembolism stockings, thigh (TED hose) Bilateral lower extremities.  Check vascular study             -antiplatelet therapy: Plavix  75 mg daily 3. Pain Management: tylenol  and Robaxin  as needed, discontinue IM Toradol  and begin tramadol 50mg  q6h PRN, may d/c robaxin  can use tramadol more frequently but will reduce to 25mg  given daughter's concern about MS changes  Will try scheduling acetaminophen  but may require addition of tramadol  Other options include Butrans patch but will try above first  4. Mood/Behavior/Sleep: Provide emotional support -antipsychotic agents: Seroquel  25 mg nightly started inpatient d/t delirum which later resolved -11/06/23 poor sleep, declined seroquel -- will d/c , daughter wants to avoid sedating meds will start melatonin 3mg  nightly; monitor  sleep 6/24 d/c Seroquel  5. Neuropsych/cognition: This patient is not capable of making decisions on his own behalf. 6. Skin/Wound Care: Routine skin checks 7. Fluids/Electrolytes/Nutrition: Routine in and outs with follow-up chemistries, cont vitamins and supplements.     Latest Ref Rng & Units 11/07/2023    5:06 AM 11/04/2023    3:25 AM 11/03/2023    3:24 AM  BMP  Glucose 70 - 99 mg/dL 888  89  896   BUN 8 - 23 mg/dL 25  19  18    Creatinine 0.61 - 1.24 mg/dL 9.21  9.20  9.00   Sodium 135 - 145 mmol/L 136  137  137  Potassium 3.5 - 5.1 mmol/L 4.2  3.6  3.4   Chloride 98 - 111 mmol/L 105  104  104   CO2 22 - 32 mmol/L 25  26  25    Calcium  8.9 - 10.3 mg/dL 8.5  8.3  8.7     8.  History of right thalamic infarction.  Received CIR.  Continue Plavix  9.  BPH.  Flomax  0.4 mg daily check PVRs 10.  Hypertension.  Monitor with increased mobility.  Benicar 5mg  daily currently on hold due to some soft blood pressures and resume as needed.    Vitals:   11/06/23 1254 11/06/23 2021 11/07/23 0620 11/07/23 1354  BP: 132/77 123/68 139/66 103/61   11/07/23 1934 11/08/23 0530 11/08/23 1717 11/08/23 1944  BP: (!) 143/65 (!) 163/77 (!) 155/79 (!) 143/94   11/09/23 0329 11/09/23 1605 11/09/23 2014 11/10/23 0510  BP: (!) 152/60 136/70 133/66 (!) 151/57   11/09/23 BPs mildly elevated , will see if this goes down with scheduled pain medication  11.  Crestor  20 mg daily 12. Abnormal U/A in ED: -11/06/23 pt's U/A in ED had +nitrites, 11-20 WBCs, but no culture was done. Has mild leukocytosis, which is downtrending. No other s/sx of infection.  -for completion sake, will repeat U/A with reflex UCx-- if U/A unremarkable, no further work up needed; follow U/A +/- UCx Repeat UA negative  13. Anemia: 11-12 range, monitor labs    Latest Ref Rng & Units 11/07/2023    5:06 AM 11/04/2023    3:25 AM 11/03/2023    3:24 AM  CBC  WBC 4.0 - 10.5 K/uL 9.1  10.6  13.0   Hemoglobin 13.0 - 17.0 g/dL 89.1  88.8  86.5    Hematocrit 39.0 - 52.0 % 33.0  33.9  40.5   Platelets 150 - 400 K/uL 318  242  271     14. H/o hereditary spherocytosis s/p splenectomy 15. COPD: continue nebs/bronchodilators PRN Off O2 sats ok 16. Vit D deficiency, level 8.51 on 6/18, started on 50kU weekly 17. Constipation:   -11/06/23 LBM 6/24, continue colace 100mg  BID 18. GI ppx: pepcid  20mg  daily  19.  RIght thalamic stroke 04/07/2023, at CIR from 04/12/2023 to 04/29/2023 causing LLE >LUE weakness, left neglect Last PMR office visit 07/01/23 at which time he was amb with walker and was recommended to come back on a PRN basis 20.  Prior Left frontoparietal infarct no CIR stay   LOS: 5 days A FACE TO FACE EVALUATION WAS PERFORMED  Prentice FORBES Compton 11/10/2023, 9:49 AM

## 2023-11-10 NOTE — Progress Notes (Signed)
 Occupational Therapy Session Note  Patient Details  Name: Samuel Mata MRN: [id number] Date of Birth: 1930/07/12  Today's Date: 11/10/2023 OT Individual Time: 1052-1200 OT Individual Time Calculation (min): 68 min  OT Individual Time: 1349-1445 OT Individual Time Calculation (min): 56 min   Short Term Goals: Week 1:  OT Short Term Goal 1 (Week 1): Pt will complete toilet transfer with Min A consistently with LRAD OT Short Term Goal 2 (Week 1): Pt will complete grooming in standing at Min A with LRAD OT Short Term Goal 3 (Week 1): Pt will complete task in standing at Min A with LRAD for at least 2 min  Skilled Therapeutic Interventions/Progress Updates:     AM Session: Pt received sitting up in wc presenting to be in good spirits receptive to skilled OT session reporting 0/10 pain at rest and 4/10 pain in L LE during WB'ing- OT offering intermittent rest breaks, repositioning, and therapeutic support to optimize participation in therapy session. Pt requesting to take shower- focused this session on ADL retraining, OOB tolerance, and functional transfer training to increase overall safety and independence in ADLs. Engaged Pt in completing functional mobility to bathroom ~65ft using RW for endurance and transfer training- min A required for balance and RW management. Pt noted to have intermittent L knee buckling during functional mobility requiring standing rest break and min A to correct. Transferred to elevated toilet seat using RW and grab bar with heavy min A +verbal cues for technique. 3/3 toileting tasks completed max A following continent void. Short distance ambulatory transfer completed to TTB positioned in walk-in shower min A +increased time and verbal cues for technique. Majority of shower completed in seated position to increase safety, for pain management, and energy conservation. He was able to complete UB bathing including washing hair with SUP. Education provided on using long  handled sponge with Pt demonstrating teach back as evidence of learning. Pt stood while holding grab bar and maintained balanced with min A while OT assisted with washing buttocks. Increased time required to dry self in seated position +rest break for energy conservation. Stand pivot TTB > wc using grab bar MIN A +increased time and verbal cues. U/LB dressing completed seated in wc. Pt able to don OH shirt and sweatshirt with SUP and pants max A to weave feet and bring pants in standing. Donned knee high TEDs total A. Set-up Pt at sink for grooming/hygiene tasks with Pt able to complete oral care and groom hair with SUP. Pt was left resting in wc with call bell in reach, chair alarm on, and all needs met.    PM Session: Pt received sitting up in wc presenting to be sleepy, however in good spirits receptive to skilled OT session reporting 4/10 pain in L LE- OT offering intermittent rest breaks, repositioning, and therapeutic support to optimize participation in therapy session. Pt declining medications from RN at this time. Pt requesting to complete grooming/hygiene tasks at beginning of session and requested to sit for tasks for energy conservation. Pt propelled w/c to sink SUP and completed oral care, shaved his face, and washed his face with SUP +increased time. Pt requesting to stay in his room for remainder of session as he anticipated his family coming to visit. Engaged Pt in completed dynamic standing balance functional reaching activity using RW to increase confidence in standing position, work on standing tolerance, and improve overall dynamic standing balance. Pt tasked with retrieving squigz from window seal and alternating UE to donn them  onto vertical window. Seated rest break provided. Pt then reversed task to remove squigz from window alternating reach with R/L UE. Min A provided overall for dynamic standing balance and mod verbal/tactile cues provided to decrease UE reliance on RW, trunk  positioning, and to facilitate improved anterior weight shifting as Pt presents with posterior bias. Pt then completed step task activity using RW to improve dynamic standing balance and WB'ing tolerance through L LE. Pt able to complete step staps forwards towards colored disk x8 reps each side with min A for balance and mod verbal cues for reassurance and technique. Pt extremely fatigued at end of session requesting to return to bed. Stand pivot using RW wc > EOB min A +verbal cues for technique. EOB > supine light min A to bring L LE into bed. Pt was left resting in bed with call bell in reach, bed alarm on, and all needs met.    Therapy Documentation Precautions:  Precautions Precautions: Fall Recall of Precautions/Restrictions: Intact Precaution/Restrictions Comments: pt able to tolerate activity on room air Restrictions Weight Bearing Restrictions Per Provider Order: Yes LLE Weight Bearing Per Provider Order: Weight bearing as tolerated   Therapy/Group: Individual Therapy  Samuel Mata 11/10/2023, 8:00 AM

## 2023-11-10 NOTE — Patient Care Conference (Signed)
 Inpatient RehabilitationTeam Conference and Plan of Care Update Date: 11/09/2023   Time: 10:43 AM    Patient Name: Samuel Mata      Medical Record Number: [id number]  Date of Birth: March 29, 1931 Sex: Male         Room/Bed: 4W16C/4W16C-01 Payor Info: Payor: [employer] MEDICARE / Plan: [employer] HMO/PPO / Product Type: *No Product type* /    Admit Date/Time:  11/05/2023 11:48 AM  Primary Diagnosis:  Intertrochanteric fracture of left hip [facility])  Hospital Problems: Principal Problem:   Intertrochanteric fracture of left hip [facility])    Expected Discharge Date: Expected Discharge Date: 11/21/23  Team Members Present: Physician leading conference: Dr. Prentice Compton Social Worker Present: Rhoda Clement, LCSW Nurse Present: Barnie Ronde, RN PT Present: Recardo Milliner, PT OT Present: Katheryn Mines, OT SLP Present: Blaise Alderman, SLP     Current Status/Progress Goal Weekly Team Focus  Bowel/Bladder   continent of b/b   remain continent   Assist with toileting needs qshift and prn    Swallow/Nutrition/ Hydration               ADL's   CGA with sit to stands, toileting,  min A toilet transfer with ambulating to bathroom with RW.  Limited by pain.   supervision sit to stands and toileting and toilet transfer, min UB dressing,   ADL training, general strengthening, functional mobility training, pt/ fam education    Mobility   Bed mobility = up to MaxA for LLE and positioning, STS = MinA, SPVT = Min/ ModA with increased pain; Ambulation = up to 20 ft using RW with MinA and close w/c follow for pain. Dtr did not like how drowsy he was after taking tramadol but was most likely from med interaction with other meds. Would prefer pt to have tramadol prior to therapy sessions.   overall CGA  Barriers: pain in WB /// Work on: timing of pain medications with therapy sessions, improving LOA in standing transfers, improving quality of gait, LLE strengthening, family ed    Communication                 Safety/Cognition/ Behavioral Observations               Pain   no c/o pain   Remain pain free   Assess qshift and prn    Skin   incision to the left thigh   promote healing  Assess qshift and prn      Discharge Planning:  Unsure of plan for discharge-family discussing options-ALF and SNF. Will see what family can provide and work on this plan.   Team Discussion: Patient post IT fracture of left hip with remote history of CVA. Patient off supplemental oxygen but remains sleepy; MD adjusting pain medications due to sensitivity.  Patient on target to meet rehab goals: yes, currently need CGA for toileting.  Needs mod assist for bathing and max assist for lower body dressing but min assist for upper body dressing.  Completes sit -stand with CGA - min assist. Needs min assist for toilet transfers using a step-2 pattern. Goals for discharge set for supervision to CGA overall.  *See Care Plan and progress notes for long and short-term goals.   Revisions to Treatment Plan:  N/A   Teaching Needs: Safety, medications, transfers, toileting, etc.   Current Barriers to Discharge: Home enviroment access/layout and Lack of/limited family support  Possible Resolutions to Barriers: Family education SW to confirm dispo options with family  Medical Summary Current Status: pain under poor control,sensitive to medications, BP elevated ? pain related  Barriers to Discharge: Medical stability;Behavior/Mood   Possible Resolutions to [employer]: manage pain meds, avoiding oversedation   Continued Need for Acute Rehabilitation Level of Care: The patient requires daily medical management by a physician with specialized training in physical medicine and rehabilitation for the following reasons: Direction of a multidisciplinary physical rehabilitation program to maximize functional independence : Yes Medical management of patient stability for increased activity  during participation in an intensive rehabilitation regime.: Yes Analysis of laboratory values and/or radiology reports with any subsequent need for medication adjustment and/or medical intervention. : Yes   I attest that I was present, lead the team conference, and concur with the assessment and plan of the team.   Fredericka Sober B 11/10/2023, 8:27 AM

## 2023-11-11 DIAGNOSIS — S72145S Nondisplaced intertrochanteric fracture of left femur, sequela: Secondary | ICD-10-CM

## 2023-11-11 DIAGNOSIS — F54 Psychological and behavioral factors associated with disorders or diseases classified elsewhere: Secondary | ICD-10-CM

## 2023-11-11 MED ORDER — POLYETHYLENE GLYCOL 3350 17 G PO PACK
17.0000 g | PACK | Freq: Every day | ORAL | Status: DC
  Administered 2023-11-12 – 2023-11-14 (×3): 17 g via ORAL
  Filled 2023-11-11 (×4): qty 1

## 2023-11-11 MED ORDER — SORBITOL 70 % SOLN: 15.0000 mL | Freq: Every day | Status: DC | PRN

## 2023-11-11 MED ORDER — DOCUSATE SODIUM 100 MG PO CAPS
100.0000 mg | ORAL_CAPSULE | Freq: Two times a day (BID) | ORAL | Status: DC
  Administered 2023-11-11 – 2023-11-23 (×25): 100 mg via ORAL
  Filled 2023-11-11 (×25): qty 1

## 2023-11-11 NOTE — Progress Notes (Signed)
 Physical Therapy Session Note  Patient Details  Name: Samuel Mata MRN: [id number] Date of Birth: 1930-10-10  Today's Date: 11/11/2023 PT Individual Time: 0803-0901 PT Individual Time Calculation (min): 58 min   Short Term Goals: Week 1:  PT Short Term Goal 1 (Week 1): Pt will be able to perform supine to sit transfer with MI. PT Short Term Goal 2 (Week 1): Pt will be able to perform sit to supine transfer with MI. PT Short Term Goal 3 (Week 1): Pt will be able to perform STS transfer CGA. PT Short Term Goal 4 (Week 1): Pt will be able to ambulate x 60 ft with RW with PT CGA  Skilled Therapeutic Interventions/Progress Updates:  Patient supine in bed on entrance to room. Patient alert and agreeable to PT session. SIL present but leaves at start of session.   Patient with no pain complaint at start of session. RN notified re: premedication requested prior to full therapy morning.   Therapeutic Activity: Bed Mobility: Pt performed supine > sit with CGA/ MinA for LLE off of EOB. Minimal vc required for technique and to continue effort. While seated, assisted pt in donning TED hose MaxA and pants with Mod/ MaxA. Shoes with Max A.  Transfers: Pt performed sit<>stand and stand pivot transfers throughout session with SBA/ CGA. Provided vc for pushing with BUE from armrests of seat.  MD arrives during dressing and performs morning assessment. Relates to pt possibility of not returning home and instead placement into ALF with wife on d/c d/t need for increased LOA and falls resulting in injury.   Gait Training:  Pt ambulated ~90 ft using RW with CGA and close w/c follow for pain/ fatigue. Demonstrated continued antalgic gait pattern with need for vc/ tc for maintaining LLE ahead of RLE for reduced WB into LLE.  Attempted to setup up NMR reaching task, however, pt is emotionally labile upon reaching mat table. Pt is upset re: his CLOF. Unwilling to talk further despite encouragement to discuss.  Assumed that pt upset over information provided by MD while in room. Ambulated back to room with similar quality of gait.   Patient seated in recliner with BLE elevated to manage swelling in LLE at end of session with brakes locked, seat pad alarm set, and all needs within reach.   Therapy Documentation Precautions:  Precautions Precautions: Fall Recall of Precautions/Restrictions: Intact Precaution/Restrictions Comments: pt able to tolerate activity on room air Restrictions Weight Bearing Restrictions Per Provider Order: Yes LLE Weight Bearing Per Provider Order: Weight bearing as tolerated  Pain:  Increased pain noted with change in quality of gait and WB to LLE. Pt premedicated and addressed with repositioning at end of amb bouts.    Therapy/Group: Individual Therapy  [name] PT, DPT, CSRS 11/11/2023, 5:47 PM

## 2023-11-11 NOTE — Consult Note (Signed)
 Neuropsychological Consultation Comprehensive Inpatient Rehab   Patient:   Samuel Mata   DOB:   May 29, 1930  MR Number:  [id number]  Location:  [facility] [address] CENTER A [address] [place] [address] Dept: [phone] Loc: [phone]           Date of Service:   11/11/2023  Start Time:   9 AM End Time:   10 AM  Provider/Observer:  Norleen Asa, Psy.D.       Clinical Neuropsychologist       Billing Code/Service: [phone]  Reason for Service:    Samuel Mata is a 89 year old male referred for neuropsychological consultation during his ongoing admission to the comprehensive inpatient rehabilitation unit.  Patient has had a recent fall with hip fracture and in the recent past had a right thalamic stroke and was on the unit at that time as well.  Other past medical history includes BPH, hypertension, and a pulmonary nocardiosis, CVA right thalamus and CIR 04/12/2023 - 04/29/2023.  Patient presented on 10/31/2023 after a fall in the kitchen with loss of balance.  No loss of consciousness was noted.  Patient had immediate left hip pain and brought to emergency room.  Hip fracture was noted on imaging.  Other imaging was not indicative of any acute process.  There was chronic cerebellar and right thalamic infarcts noted on imaging.  Post orthopedic interventions patient was evaluated and admitted to CIR due to decreased functional mobility.  During today's clinical visit, we reviewed patient's recent past medical events.  Patient had an accurate overall understanding of his status but had limits to details.  Patient was oriented x 4 but did show signs of age-related cognitive change.  Patient had been independent.  Patient noted that his fall was after he had prepared 2 bowls of hot soup and was transferring them to kitchen table for he and his wife to have.  He was at that point of walking with 2 bowls of soup that he lost his  balance and fell.  Given previous stroke events and thalamic and cerebellar brain regions greater risk of balance and coordination/motor functions would be expected.  Today, we also discussed the issue as far as placement and whether the plan was to go home and remain at home with his wife or to consider assisted living type of facility.  Patient's wife apparently has been more receptive than the patient himself.  We discussed the actual pros and cons regarding this type of consideration in a straightforward manner and the patient was receptive to at least discussing that further.  HPI for the current admission:    HPI: Samuel Mata is a 89 year old right-handed male with history of BPH, hypertension, pulmonary nocardiosis treated with 6 months antibiotic course, quit smoking 39 years ago, CVA right thalamic with loop recorder insertion and received CIR 04/12/2023 - 04/29/2023 maintained on Plavix . Per chart review patient lives with spouse. 1 level home one-step to entry. Independent without assistive device since CVA about 8 months ago but did have some balance impairments per daughter. He did have an aide helping with shower 1 day a week wife completes other ADLs. Presented 10/31/2023 after a fall in the kitchen when he lost his balance. Denied loss of consciousness. He experienced immediate left hip pain was brought to the emergency room. Imaging of the pelvis demonstrated three-part left intertrochanteric hip fracture. CT cervical spine did not show any acute abnormalities. CTA and CT of the  head did not demonstrate any acute intracranial abnormality or large vessel occlusion. Chronic cerebellar and right thalamic infarcts were seen however. On 11/01/2023 patient underwent intramedullary nailing of the left hip per Dr. Celena. Postoperative Plavix  for history of CVA resumed. Postoperatively noted some confusion maintained on Seroquel  as well as mild hypokalemia improved to 3.6, leukocytosis 13,000 improved to  10,600. He did spike a low-grade fever 99.2. He is weightbearing as tolerated to the left lower extremity. Therapy evaluations completed due to patient decreased functional mobility was admitted for a comprehensive rehab program.   Medical History:   Past Medical History:  Diagnosis Date   Bronchiectasis without complication (HCC)    Enlarged prostate    Hearing loss    History of kidney stones    Hypertension    Pulmonary nocardiosis (HCC) 2021   treated with 6 month antibiotic course   Stroke (HCC) 2022   left frontoparietal   TIA (transient ischemic attack) 2019         Patient Active Problem List   Diagnosis Date Noted   Coping style affecting medical condition 11/11/2023   Intertrochanteric fracture of left hip (HCC) 11/05/2023   Malnutrition of moderate degree 11/01/2023   Fall at home, initial encounter 10/31/2023   Hyponatremia 04/25/2023   Right thalamic stroke (HCC) 04/12/2023   Acute CVA (cerebrovascular accident) (HCC) 04/08/2023   Acute ischemic stroke [facility])    Carotid dissection, bilateral (HCC)    Aphasia    Immunization due 05/20/2020   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   Medication monitoring encounter 03/07/2020   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   Bronchiectasis without complication (HCC) 11/22/2019   Hereditary spherocytosis (HCC) 08/29/2019   Seborrheic keratoses 08/29/2019   OSA (obstructive sleep apnea) 03/19/2019   History of TIA (transient ischemic attack) 01/23/2018   Hemoptysis 07/15/2017   Pulmonary infiltrates 07/15/2017   History of tobacco use 06/06/2017   Essential hypertension 09/22/2015   Gastroesophageal reflux disease without esophagitis 06/05/2014   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 his dress was Appropriate and he was Well Groomed and his manners were Appropriate to the situation.  his participation was indicative of Appropriate behaviors.  There were physical disabilities noted.  he displayed an appropriate level of cooperation and motivation.    Interactions:    Active Appropriate  Attention:   abnormal and attention span appeared shorter than expected for age  Memory:   within normal limits; recent and remote memory intact  Visuo-spatial:   not examined  Speech (Volume):  normal  Speech:   normal; normal  Thought Process:  Coherent and Relevant  Coherent and Linear  Though Content:  WNL; not suicidal and not homicidal  Orientation:   person, place, and situation  Judgment:   Fair  Planning:   Poor  Affect:    Appropriate  Mood:    Euthymic  Insight:   Fair  Intelligence:   high  Psychiatric History:  No prior psychiatric history   Family Med/Psych History:  Family History  Problem Relation Age of Onset   Lung disease Neg Hx     Impression/DX:   Samuel Mata is a 89 year old male referred for neuropsychological consultation during his ongoing admission to the comprehensive inpatient rehabilitation unit.  Patient has had  a recent fall with hip fracture and in the recent past had a right thalamic stroke and was on the unit at that time as well.  Other past medical history includes BPH, hypertension, and a pulmonary nocardiosis, CVA right thalamus and CIR 04/12/2023 - 04/29/2023.  Patient presented on 10/31/2023 after a fall in the kitchen with loss of balance.  No loss of consciousness was noted.  Patient had immediate left hip pain and brought to emergency room.  Hip fracture was noted on imaging.  Other imaging was not indicative of any acute process.  There was chronic cerebellar and right thalamic infarcts noted on imaging.  Post orthopedic interventions patient was evaluated and admitted to CIR due to decreased functional  mobility.  During today's clinical visit, we reviewed patient's recent past medical events.  Patient had an accurate overall understanding of his status but had limits to details.  Patient was oriented x 4 but did show signs of age-related cognitive change.  Patient had been independent.  Patient noted that his fall was after he had prepared 2 bowls of hot soup and was transferring them to kitchen table for he and his wife to have.  He was at that point of walking with 2 bowls of soup that he lost his balance and fell.  Given previous stroke events and thalamic and cerebellar brain regions greater risk of balance and coordination/motor functions would be expected.  Today, we also discussed the issue as far as placement and whether the plan was to go home and remain at home with his wife or to consider assisted living type of facility.  Patient's wife apparently has been more receptive than the patient himself.  We discussed the actual pros and cons regarding this type of consideration in a straightforward manner and the patient was receptive to at least discussing that further.          Electronically Signed   _______________________ Norleen Asa, Psy.D. Clinical Neuropsychologist

## 2023-11-11 NOTE — Progress Notes (Signed)
 Occupational Therapy Session Note  Patient Details  Name: Samuel Mata MRN: [id number] Date of Birth: 09/06/1930  Today's Date: 11/11/2023 AM Session:  OT Individual Time: 8952-8841 OT Individual Time Calculation (min): 71 min PM Session:   OT Individual Time: 1300-1330 OT Individual Time Calculation (min): 30 min   Short Term Goals: Week 1:  OT Short Term Goal 1 (Week 1): Pt will complete toilet transfer with Min A consistently with LRAD OT Short Term Goal 2 (Week 1): Pt will complete grooming in standing at Min A with LRAD OT Short Term Goal 3 (Week 1): Pt will complete task in standing at Min A with LRAD for at least 2 min  Skilled Therapeutic Interventions/Progress Updates:     AM Session:  Pt received sitting up in recliner presenting to be in good spirits receptive to skilled OT session reporting 0/10 pain at rest and 4-6/10 pain in L LE during WB'ing- OT offering intermittent rest breaks, repositioning, and therapeutic support to optimize participation in therapy session. Focus this session on ADL retraining, dynamic balance, and activity tolerance.   Pt requesting to use restroom. Stand pivot recliner > wc using RW CGA +increased time. Transported Pt total A to bathroom via wc for energy conservation. Stand pivot wc > elevated toilet seat using RW to L light min A. Pt able to doff/donn pants in standing position with CGA this session- improved confidence releasing B UE support from RW noted and improved dynamic standing balance. Continent void documented in flowsheets with Pt completing per-care in seated position. When transferring back to wc via stand pivot to wc using RW, Pt with increased challenges  side stepping L LE and WB'ing through L LE when stepping R LE with min A and increased time required.   Transported Pt total A to therapy gym in wc for time management and energy conservation.   Pt completed dynamic standing balance functional reaching activity to work on standing  tolerance, dynamic balance, and improve overall confidence in standing position without B UE support to improve independence in ADLs. Pt tasked with alternating reaching with R/L UE to retrieve blue (moderate resistance) clothes pins from table top and donn them onto net of basketball goal net facilitating overhead reaching with single UE support on RW. Pt then reversed task to remove clothes pins from basketball goal net with seated rest break provided following. CGA-min A required overall for balance with pt able to tolerate standing ~3 minutes during task. During second trial, Pt tasked with doffing clothes pins from posterior portion of pants to simulate posterior reaching during peri-care or clothing management, planing them onto basketball goal net, and then doffing them to place them in basket. Improved confidence noted in standing this trial with CGA provided for balance and Pt tolerating standing ~4 minutes. Seated rest break following.   Pt then completed dynamic standing balance beach ball volley activity in standing position with intermittent use of RW. Pt tasked with using B UE to hit beach ball back and forth with RT without UE support on RW to increase overall activity tolerance, reactionary balance, and confidence in standing position. He completed total of 3 trials maintaining standing balance with min A for 2-3 min each trial and seated rest breaks provided between each trial. Pt did require up to mod A to correct LOB x2 trials and intermittently placed UE on RW when experiencing LOB x2 trials.  Pt fatigued at end of session.Transported back to room total A in wc. Stand pivot wc >  recliner using RW CGA.   Pt was left resting in recliner with call bell in reach, chair alarm on, and all needs met.    PM Session:  Pt received sitting up in wc presenting to be tired, however in good spirits receptive to skilled OT session reporting 0/10 pain- OT offering intermittent rest breaks,  repositioning, and therapeutic support to optimize participation in therapy session. Pt requesting to use restroom. Engaged Pt in completing functional mobility to bathroom using RW for endurance training with Pt able to complete ~19ft with CGA no LOB using step-to method +increased time d/t increase pain in L LE during WB'ing. Increased time provided on toilet d/t need for BM. Pt was able to complete peri-care in seated/standing position with CGA and manage clothing in standing with CGA provided for balance and min verbal cues for to safety. Pt then ambulated to sink using RW with CGA and stood at sink to wash hands and complete oral care with min A provided for balance. Pt fatigued at end of session requesting to return to bed. EOB > supine min A to lift L LE into bed. Pt was left resting in bed with call bell in reach, bed alarm on, and all needs met.    Therapy Documentation Precautions:  Precautions Precautions: Fall Recall of Precautions/Restrictions: Intact Precaution/Restrictions Comments: pt able to tolerate activity on room air Restrictions Weight Bearing Restrictions Per Provider Order: Yes LLE Weight Bearing Per Provider Order: Weight bearing as tolerated    Therapy/Group: Individual Therapy  [name] 11/11/2023, 8:02 AM

## 2023-11-11 NOTE — Progress Notes (Signed)
 PROGRESS NOTE   Subjective/Complaints:  Pain in OT improved to 4/10 yesterday , now on schedule tylenol  with prn 25mg  tramadol  last 2 ams ROS: as per HPI. Denies CP, SOB, abd pain, N/V/D/C, or any other complaints at this time.    Objective:   No results found.  No results for input(s): WBC, HGB, HCT, PLT in the last 72 hours.  No results for input(s): NA, K, CL, CO2, GLUCOSE, BUN, CREATININE, CALCIUM  in the last 72 hours.   Urinalysis    Component Value Date/Time   COLORURINE YELLOW 11/06/2023 1648   APPEARANCEUR CLEAR 11/06/2023 1648   LABSPEC 1.019 11/06/2023 1648   PHURINE 6.0 11/06/2023 1648   GLUCOSEU NEGATIVE 11/06/2023 1648   HGBUR NEGATIVE 11/06/2023 1648   BILIRUBINUR NEGATIVE 11/06/2023 1648   BILIRUBINUR negative 12/11/2022 1225   KETONESUR NEGATIVE 11/06/2023 1648   PROTEINUR NEGATIVE 11/06/2023 1648   UROBILINOGEN 0.2 12/11/2022 1225   NITRITE NEGATIVE 11/06/2023 1648   LEUKOCYTESUR NEGATIVE 11/06/2023 1648  Amorphous crystals present No bacteria seen 6-10 RBC 0-5 squamous 11-20 WBCs    Intake/Output Summary (Last 24 hours) at 11/11/2023 9177 Last data filed at 11/11/2023 0441 Gross per 24 hour  Intake 240 ml  Output 795 ml  Net -555 ml        Physical Exam: Vital Signs Blood pressure (!) 140/70, pulse 70, temperature 98.3 F (36.8 C), resp. rate 16, height 5' 7 (1.702 m), weight 62.4 kg, SpO2 94%.   General: No acute distress Mood and affect are appropriate Heart: Regular rate and rhythm no rubs murmurs or extra sounds Lungs: Clear to auscultation, breathing unlabored, no rales or wheezes Abdomen: Positive bowel sounds, soft nontender to palpation, nondistended Extremities: No clubbing, cyanosis, or edema Skin: No evidence of breakdown, no evidence of rash Pain with AROM Hip flexion but not PROM Neurologic: Cranial nerves II through XII intact, motor strength  is 5/5 in bilateral deltoid, bicep, tricep, grip, right hip flexor, knee extensors, ankle dorsiflexor and plantar flexor 3-/5 Left HF, KE, 5/5 ADF  Musculoskeletal: Full range of motion in all 4 extremities. No joint swelling     Assessment/Plan: 1. Functional deficits which require 3+ hours per day of interdisciplinary therapy in a comprehensive inpatient rehab setting. Physiatrist is providing close team supervision and 24 hour management of active medical problems listed below. Physiatrist and rehab team continue to assess barriers to discharge/monitor patient progress toward functional and medical goals  Care Tool:  Bathing              Bathing assist Assist Level: Moderate Assistance - Patient 50 - 74%     Upper Body Dressing/Undressing Upper body dressing        Upper body assist Assist Level: Maximal Assistance - Patient 25 - 49%    Lower Body Dressing/Undressing Lower body dressing            Lower body assist Assist for lower body dressing: Total Assistance - Patient < 25%     Toileting Toileting    Toileting assist Assist for toileting: Minimal Assistance - Patient > 75%     Transfers Chair/bed transfer  Transfers assist     Chair/bed  transfer assist level: Minimal Assistance - Patient > 75%     Locomotion Ambulation   Ambulation assist      Assist level: Minimal Assistance - Patient > 75% Assistive device: Walker-rolling Max distance: 78'   Walk 10 feet activity   Assist  Walk 10 feet activity did not occur: Safety/medical concerns  Assist level: Minimal Assistance - Patient > 75% Assistive device: Walker-rolling   Walk 50 feet activity   Assist Walk 50 feet with 2 turns activity did not occur: Safety/medical concerns  Assist level: Minimal Assistance - Patient > 75% Assistive device: Walker-rolling    Walk 150 feet activity   Assist Walk 150 feet activity did not occur: Safety/medical concerns         Walk 10  feet on uneven surface  activity   Assist Walk 10 feet on uneven surfaces activity did not occur: Safety/medical concerns         Wheelchair     Assist Is the patient using a wheelchair?: Yes Type of Wheelchair: Manual Wheelchair activity did not occur: Safety/medical concerns         Wheelchair 50 feet with 2 turns activity    Assist    Wheelchair 50 feet with 2 turns activity did not occur: Safety/medical concerns       Wheelchair 150 feet activity     Assist  Wheelchair 150 feet activity did not occur: Safety/medical concerns       Blood pressure (!) 140/70, pulse 70, temperature 98.3 F (36.8 C), resp. rate 16, height 5' 7 (1.702 m), weight 62.4 kg, SpO2 94%.  Medical Problem List and Plan: 1. Functional deficits secondary to left three-part intertrochanteric hip fracture with subsequent IM nailing 11/01/2023.  Weightbearing as tolerated             -patient may shower with incisions covered             -ELOS/Goals: 7/9, supervision PT/OT- ? Home vs ALF   2.  Antithrombotics: -DVT/anticoagulation:  Mechanical: Antiembolism stockings, thigh (TED hose) Bilateral lower extremities.  Check vascular study             -antiplatelet therapy: Plavix  75 mg daily 3. Pain Management: tylenol  and Robaxin  as needed, discontinue IM Toradol  and begin tramadol  50mg  q6h PRN, may d/c robaxin  can use tramadol  more frequently but will reduce to 25mg  given daughter's concern about MS changes  Will try scheduling acetaminophen  but may require addition of tramadol   Other options include Butrans patch but will try above first  4. Mood/Behavior/Sleep: Provide emotional support -antipsychotic agents: Seroquel  25 mg nightly started inpatient d/t delirum which later resolved -11/06/23 poor sleep, declined seroquel -- will d/c , daughter wants to avoid sedating meds will start melatonin 3mg  nightly; monitor sleep 6/24 d/c Seroquel  5. Neuropsych/cognition: This patient is not  capable of making decisions on his own behalf. 6. Skin/Wound Care: Routine skin checks Per [facility], sutures out [place] or Tues with f/u xrays, OV with Dr Celena 2 wk after that 7. Fluids/Electrolytes/Nutrition: Routine in and outs with follow-up chemistries, cont vitamins and supplements.     Latest Ref Rng & Units 11/07/2023    5:06 AM 11/04/2023    3:25 AM 11/03/2023    3:24 AM  BMP  Glucose 70 - 99 mg/dL 888  89  896   BUN 8 - 23 mg/dL 25  19  18    Creatinine 0.61 - 1.24 mg/dL 9.21  9.20  9.00   Sodium 135 -  145 mmol/L 136  137  137   Potassium 3.5 - 5.1 mmol/L 4.2  3.6  3.4   Chloride 98 - 111 mmol/L 105  104  104   CO2 22 - 32 mmol/L 25  26  25    Calcium  8.9 - 10.3 mg/dL 8.5  8.3  8.7     8.  History of right thalamic infarction.  Received CIR.  Continue Plavix  9.  BPH.  Flomax  0.4 mg daily check PVRs 10.  Hypertension.  Monitor with increased mobility.  Benicar 5mg  daily currently on hold due to some soft blood pressures and resume as needed.    Vitals:   11/07/23 1354 11/07/23 1934 11/08/23 0530 11/08/23 1717  BP: 103/61 (!) 143/65 (!) 163/77 (!) 155/79   11/08/23 1944 11/09/23 0329 11/09/23 1605 11/09/23 2014  BP: (!) 143/94 (!) 152/60 136/70 133/66   11/10/23 0510 11/10/23 1608 11/10/23 1951 11/11/23 0438  BP: (!) 151/57 (!) 143/64 (!) 140/68 (!) 140/70   11/09/23 BPs mildly elevated , will see if this goes down with scheduled pain medication  11.  Crestor  20 mg daily 12. Abnormal U/A in ED: -11/06/23 pt's U/A in ED had +nitrites, 11-20 WBCs, but no culture was done. Has mild leukocytosis, which is downtrending. No other s/sx of infection.  -for completion sake, will repeat U/A with reflex UCx-- if U/A unremarkable, no further work up needed; follow U/A +/- UCx Repeat UA negative  13. Anemia: 11-12 range, monitor labs    Latest Ref Rng & Units 11/07/2023    5:06 AM 11/04/2023    3:25 AM 11/03/2023    3:24 AM  CBC  WBC 4.0 - 10.5 K/uL 9.1  10.6  13.0   Hemoglobin  13.0 - 17.0 g/dL 89.1  88.8  86.5   Hematocrit 39.0 - 52.0 % 33.0  33.9  40.5   Platelets 150 - 400 K/uL 318  242  271     14. H/o hereditary spherocytosis s/p splenectomy 15. COPD: continue nebs/bronchodilators PRN Off O2 sats ok 16. Vit D deficiency, level 8.51 on 6/18, started on 50kU weekly 17. Constipation:   -11/06/23 LBM 6/24, continue colace 100mg  BID 18. GI ppx: pepcid  20mg  daily  19.  RIght thalamic stroke 04/07/2023, at CIR from 04/12/2023 to 04/29/2023 causing LLE >LUE weakness, left neglect Last PMR office visit 07/01/23 at which time he was amb with walker and was recommended to come back on a PRN basis 20.  Prior Left frontoparietal infarct no CIR stay   LOS: 6 days A FACE TO FACE EVALUATION WAS PERFORMED  Prentice FORBES [place] 11/11/2023, 8:22 AM

## 2023-11-11 NOTE — Plan of Care (Signed)
  Problem: Consults Goal: RH GENERAL PATIENT EDUCATION Description: See Patient Education module for education specifics. Outcome: Progressing   Problem: RH BOWEL ELIMINATION Goal: RH STG MANAGE BOWEL WITH ASSISTANCE Description: STG Manage Bowel with toileting Assistance. Outcome: Progressing Goal: RH STG MANAGE BOWEL W/MEDICATION W/ASSISTANCE Description: STG Manage Bowel with Medication with mod I Assistance. Outcome: Progressing   Problem: RH BLADDER ELIMINATION Goal: RH STG MANAGE BLADDER WITH ASSISTANCE Description: STG Manage Bladder With toileting Assistance Outcome: Progressing Goal: RH STG MANAGE BLADDER WITH MEDICATION WITH ASSISTANCE Description: STG Manage Bladder With Medication With mod I Assistance. Outcome: Progressing   Problem: RH PAIN MANAGEMENT Goal: RH STG PAIN MANAGED AT OR BELOW [name] PAIN GOAL Description: < 4 with PRNs Outcome: Progressing   Problem: RH KNOWLEDGE DEFICIT GENERAL Goal: RH STG INCREASE KNOWLEDGE OF SELF CARE AFTER HOSPITALIZATION Description: Patient and wife will be able to manage care at discharge using educational resources for medications and care independently Outcome: Progressing

## 2023-11-11 NOTE — Progress Notes (Signed)
 Physical Therapy Session Note  Patient Details  Name: Samuel Mata MRN: [id number] Date of Birth: 01-23-1931  Today's Date: 11/11/2023 PT Individual Time: 8594-8564 PT Individual Time Calculation (min): 30 min   Short Term Goals: Week 1:  PT Short Term Goal 1 (Week 1): Pt will be able to perform supine to sit transfer with MI. PT Short Term Goal 2 (Week 1): Pt will be able to perform sit to supine transfer with MI. PT Short Term Goal 3 (Week 1): Pt will be able to perform STS transfer CGA. PT Short Term Goal 4 (Week 1): Pt will be able to ambulate x 60 ft with RW with PT CGA  Skilled Therapeutic Interventions/Progress Updates:  Patient seated upright in recliner on entrance to room. Patient alert and agreeable to PT session.   Patient with no pain complaint at start of session prior to WB to LLE.  Therapeutic Activity: Attempted to initiate 5xSTS, but pt relates fatigue from earlier sessions and engages therapist with questions. Provided with therapeutic listening and support to pt and his concerns.   Guided in therex that pt can perform while in room. Educated on raising/ lowering leg rests of recliner and guided in SLR, LAQs, ankle pumps, and hip abd/ add step outs/ ins. All will require controlled movements in order to manage pain. Pt able to reposition self in recliner with SBA for improved seated positioning prior to ending session.   Patient seated upright in recliner with BLE elevated at end of session with brakes locked, seat pad alarm set, and all needs within reach.   Therapy Documentation Precautions:  Precautions Precautions: Fall Recall of Precautions/Restrictions: Intact Precaution/Restrictions Comments: pt able to tolerate activity on room air Restrictions Weight Bearing Restrictions Per Provider Order: Yes LLE Weight Bearing Per Provider Order: Weight bearing as tolerated  Pain:  Minimal pain noted this session. Related to pt to perform therex within minimal  pain ROM.   Therapy/Group: Individual Therapy  [name] PT, DPT, CSRS 11/11/2023, 5:48 PM

## 2023-11-12 NOTE — Progress Notes (Addendum)
 PROGRESS NOTE   Subjective/Complaints:  Pt doing well, slept well, pain well managed, LBM yesterday, urinating fine. No other complaints or concerns.   ROS: as per HPI. Denies CP, SOB, abd pain, N/V/D/C, or any other complaints at this time.    Objective:   No results found.  No results for input(s): WBC, HGB, HCT, PLT in the last 72 hours.  No results for input(s): NA, K, CL, CO2, GLUCOSE, BUN, CREATININE, CALCIUM  in the last 72 hours.   Urinalysis    Component Value Date/Time   COLORURINE YELLOW 11/06/2023 1648   APPEARANCEUR CLEAR 11/06/2023 1648   LABSPEC 1.019 11/06/2023 1648   PHURINE 6.0 11/06/2023 1648   GLUCOSEU NEGATIVE 11/06/2023 1648   HGBUR NEGATIVE 11/06/2023 1648   BILIRUBINUR NEGATIVE 11/06/2023 1648   BILIRUBINUR negative 12/11/2022 1225   KETONESUR NEGATIVE 11/06/2023 1648   PROTEINUR NEGATIVE 11/06/2023 1648   UROBILINOGEN 0.2 12/11/2022 1225   NITRITE NEGATIVE 11/06/2023 1648   LEUKOCYTESUR NEGATIVE 11/06/2023 1648      Intake/Output Summary (Last 24 hours) at 11/12/2023 1256 Last data filed at 11/12/2023 0332 Gross per 24 hour  Intake 117 ml  Output 600 ml  Net -483 ml        Physical Exam: Vital Signs Blood pressure 137/63, pulse 63, temperature 97.8 F (36.6 C), temperature source Oral, resp. rate 16, height 5' 7 (1.702 m), weight 62.4 kg, SpO2 94%.   General: No acute distress, sitting in w/c finishing with PT Mood and affect are appropriate Heart: Regular rate and rhythm no rubs murmurs or extra sounds Lungs: Clear to auscultation, breathing unlabored, no rales or wheezes Abdomen: Positive bowel sounds, soft nontender to palpation, nondistended Extremities: No clubbing, cyanosis, or edema Skin: No evidence of breakdown, no evidence of rash  PRIOR EXAMS: Pain with AROM Hip flexion but not PROM Neurologic: Cranial nerves II through XII intact, motor  strength is 5/5 in bilateral deltoid, bicep, tricep, grip, right hip flexor, knee extensors, ankle dorsiflexor and plantar flexor 3-/5 Left HF, KE, 5/5 ADF  Musculoskeletal: Full range of motion in all 4 extremities. No joint swelling     Assessment/Plan: 1. Functional deficits which require 3+ hours per day of interdisciplinary therapy in a comprehensive inpatient rehab setting. Physiatrist is providing close team supervision and 24 hour management of active medical problems listed below. Physiatrist and rehab team continue to assess barriers to discharge/monitor patient progress toward functional and medical goals  Care Tool:  Bathing              Bathing assist Assist Level: Moderate Assistance - Patient 50 - 74%     Upper Body Dressing/Undressing Upper body dressing        Upper body assist Assist Level: Maximal Assistance - Patient 25 - 49%    Lower Body Dressing/Undressing Lower body dressing            Lower body assist Assist for lower body dressing: Total Assistance - Patient < 25%     Toileting Toileting    Toileting assist Assist for toileting: Minimal Assistance - Patient > 75%     Transfers Chair/bed transfer  Transfers assist     Chair/bed  transfer assist level: Minimal Assistance - Patient > 75%     Locomotion Ambulation   Ambulation assist      Assist level: Minimal Assistance - Patient > 75% Assistive device: Walker-rolling Max distance: 31'   Walk 10 feet activity   Assist  Walk 10 feet activity did not occur: Safety/medical concerns  Assist level: Minimal Assistance - Patient > 75% Assistive device: Walker-rolling   Walk 50 feet activity   Assist Walk 50 feet with 2 turns activity did not occur: Safety/medical concerns  Assist level: Minimal Assistance - Patient > 75% Assistive device: Walker-rolling    Walk 150 feet activity   Assist Walk 150 feet activity did not occur: Safety/medical concerns          Walk 10 feet on uneven surface  activity   Assist Walk 10 feet on uneven surfaces activity did not occur: Safety/medical concerns         Wheelchair     Assist Is the patient using a wheelchair?: Yes Type of Wheelchair: Manual Wheelchair activity did not occur: Safety/medical concerns         Wheelchair 50 feet with 2 turns activity    Assist    Wheelchair 50 feet with 2 turns activity did not occur: Safety/medical concerns       Wheelchair 150 feet activity     Assist  Wheelchair 150 feet activity did not occur: Safety/medical concerns       Blood pressure 137/63, pulse 63, temperature 97.8 F (36.6 C), temperature source Oral, resp. rate 16, height 5' 7 (1.702 m), weight 62.4 kg, SpO2 94%.  Medical Problem List and Plan: 1. Functional deficits secondary to left three-part intertrochanteric hip fracture with subsequent IM nailing 11/01/2023.  Weightbearing as tolerated             -patient may shower with incisions covered             -ELOS/Goals: 7/9, supervision PT/OT- ? Home vs ALF  -Continue CIR  2.  Antithrombotics: -DVT/anticoagulation:  Mechanical: Antiembolism stockings, thigh (TED hose) Bilateral lower extremities.  Check vascular study             -antiplatelet therapy: Plavix  75 mg daily 3. Pain Management: tylenol  and Robaxin  as needed, discontinue IM Toradol  and begin tramadol  50mg  q6h PRN, may d/c robaxin  can use tramadol  more frequently but will reduce to 25mg  given daughter's concern about MS changes  Will try scheduling acetaminophen  but may require addition of tramadol   Other options include Butrans patch but will try above first  -11/12/23 pain well managed, cont regimen 4. Mood/Behavior/Sleep: Provide emotional support -antipsychotic agents: Seroquel  25 mg nightly started inpatient d/t delirum which later resolved -11/06/23 poor sleep, declined seroquel -- will d/c , daughter wants to avoid sedating meds will start melatonin 3mg   nightly; monitor sleep 6/24 d/c Seroquel  5. Neuropsych/cognition: This patient is not capable of making decisions on his own behalf. 6. Skin/Wound Care: Routine skin checks Per [facility], sutures out [place] or Tues with f/u xrays, OV with Dr Celena 2 wk after that 7. Fluids/Electrolytes/Nutrition: Routine in and outs with follow-up chemistries, cont vitamins and supplements.     Latest Ref Rng & Units 11/07/2023    5:06 AM 11/04/2023    3:25 AM 11/03/2023    3:24 AM  BMP  Glucose 70 - 99 mg/dL 888  89  896   BUN 8 - 23 mg/dL 25  19  18    Creatinine 0.61 - 1.24 mg/dL  0.78  0.79  0.99   Sodium 135 - 145 mmol/L 136  137  137   Potassium 3.5 - 5.1 mmol/L 4.2  3.6  3.4   Chloride 98 - 111 mmol/L 105  104  104   CO2 22 - 32 mmol/L 25  26  25    Calcium  8.9 - 10.3 mg/dL 8.5  8.3  8.7     8.  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   Continue Plavix   9.  BPH.  Flomax  0.4 mg daily check PVRs  10.  Hypertension.  Monitor with increased mobility.  Benicar 5mg  daily currently on hold due to some soft blood pressures and resume as needed.   11/09/23 BPs mildly elevated , will see if this goes down with scheduled pain medication  -11/12/23 BPs improving, monitor Vitals:   11/08/23 1717 11/08/23 1944 11/09/23 0329 11/09/23 1605  BP: (!) 155/79 (!) 143/94 (!) 152/60 136/70   11/09/23 2014 11/10/23 0510 11/10/23 1608 11/10/23 1951  BP: 133/66 (!) 151/57 (!) 143/64 (!) 140/68   11/11/23 0438 11/11/23 1328 11/11/23 1937 11/12/23 0340  BP: (!) 140/70 114/60 136/73 137/63   11.  Crestor  20 mg daily 12. Abnormal U/A in ED: -11/06/23 pt's U/A in ED had +nitrites, 11-20 WBCs, but no culture was done. Has mild leukocytosis, which is downtrending. No other s/sx of infection.  -for completion sake, will  repeat U/A with reflex UCx-- if U/A unremarkable, no further work up needed; follow U/A +/- UCx -Repeat UA negative  13. Anemia: 11-12 range, monitor labs    Latest Ref Rng & Units 11/07/2023    5:06 AM 11/04/2023    3:25 AM 11/03/2023    3:24 AM  CBC  WBC 4.0 - 10.5 K/uL 9.1  10.6  13.0   Hemoglobin 13.0 - 17.0 g/dL 89.1  88.8  86.5   Hematocrit 39.0 - 52.0 % 33.0  33.9  40.5   Platelets 150 - 400 K/uL 318  242  271     14. H/o hereditary spherocytosis s/p splenectomy 15. COPD: continue nebs/bronchodilators PRN Off O2 sats ok 16. Vit D deficiency, level 8.51 on 6/18, started on 50kU weekly 17. Constipation:   -11/12/23 LBM yesterda, continue colace 100mg  BID 18. GI ppx: pepcid  20mg  daily  19.  Prior Left frontoparietal infarct no CIR stay   LOS: 7 days A FACE TO FACE EVALUATION WAS PERFORMED  [address] 11/12/2023, 12:56 PM

## 2023-11-12 NOTE — Progress Notes (Signed)
 Physical Therapy Session Note  Patient Details  Name: Samuel Mata MRN: [id number] Date of Birth: 1931/01/20  Today's Date: 11/12/2023 PT Individual Time: 0921-1020 PT Individual Time Calculation (min): 59 min   Short Term Goals: Week 1:  PT Short Term Goal 1 (Week 1): Pt will be able to perform supine to sit transfer with MI. PT Short Term Goal 2 (Week 1): Pt will be able to perform sit to supine transfer with MI. PT Short Term Goal 3 (Week 1): Pt will be able to perform STS transfer CGA. PT Short Term Goal 4 (Week 1): Pt will be able to ambulate x 60 ft with RW with PT CGA  Skilled Therapeutic Interventions/Progress Updates:  Patient supine in bed on entrance to room. Patient alert and agreeable to PT session. SIL present and relates that pt has gotten up on the wrong side of the bed this morning.   Patient with no pain complaint at start of session. Msg sent to RN re: administration of pain medication this morning and if pt had tramadol . Pt had at 4:22am and can have again at 10:22am.    Therapeutic Activity: Bed Mobility: Pt performed supine > sit with CGA for LLE only. Requires increased effort and extra time to complete d/t pain with movement in L hip. VC/ tc required for continued effort. Donned pants with ModA in sitting EOB. TED hose a donned to LLE with MaxA and shoes donned with MinA.  Transfers: Pt performed sit<>stand and stand pivot transfers throughout session with close supervision. Provided vc/ tc for positioning LLE ahead of self prior to transfers. Ambulatory transfer from bed to sink with RW and CGA. Pt stood at sink and brushed teeth, brushed hair, and washed face with supervision/ CGA. One minor LOB while donning belt into pants requiring CGA to correct.    Neuromuscular Re-ed: NMR facilitated during session with focus on standing balance and WB to LLE. Pt guided in staggered stance with LLE ahead of RLE and placement of Squigz on called locations in mirror. Pt is  able to place and remove well except with increased pain in WB to front LE with lower Squigz placements.   NMR performed for improvements in motor control and coordination, balance, sequencing, judgement, and self confidence/ efficacy in performing all aspects of mobility at highest level of independence.   Patient seated upright in recliner with BLE elevated at end of session with brakes locked, seat pad alarm set, and all needs within reach.   Therapy Documentation Precautions:  Precautions Precautions: Fall Recall of Precautions/Restrictions: Intact Precaution/Restrictions Comments: pt able to tolerate activity on room air Restrictions Weight Bearing Restrictions Per Provider Order: Yes LLE Weight Bearing Per Provider Order: Weight bearing as tolerated  Pain:  Pt premedicated early in AM but does have some pain with increase in WB to LLE. Addressed with repositioning and vc for LLE placement/ positioning.     Therapy/Group: Individual Therapy  [name] PT, DPT, CSRS 11/12/2023, 5:00 PM

## 2023-11-12 NOTE — Progress Notes (Signed)
 Occupational Therapy Session Note  Patient Details  Name: Samuel Mata MRN: [id number] Date of Birth: 06-11-1930  Today's Date: 11/12/2023 OT Individual Time: 8692-8595 OT Individual Time Calculation (min): 57 min    Short Term Goals: Week 1:  OT Short Term Goal 1 (Week 1): Pt will complete toilet transfer with Min A consistently with LRAD OT Short Term Goal 2 (Week 1): Pt will complete grooming in standing at Min A with LRAD OT Short Term Goal 3 (Week 1): Pt will complete task in standing at Min A with LRAD for at least 2 min  Skilled Therapeutic Interventions/Progress Updates:     Pt received sitting up in recliner, dressed and ready for the day with all ADL needs met. Pt presenting to be in good spirits receptive to skilled OT session reporting 0/10 pain- OT offering intermittent rest breaks, repositioning, and therapeutic support to optimize participation in therapy session. Focused this session on activity tolerance, dynamic balance, and increasing [name] overall confidence in standing.   Pt completed short distance functional mobility using RW ~8 ft recliner to wc with CGA +increased time. Transported Pt total A to therapy gym for energy conservation.   Engaged Pt in completing card game matching task in standing position at elevated table to address standing tolerance deficits and to improved overall self efficacy in standing position without UE support on RW. Pt was able to tolerate standing for two bouts (4 min and 5.5 min) during activity with seated rest breaks provided between bouts. B UE were engaged in card game for majority of standing with intermittent single UE support on table top provided when Pt became fatigued. Improved standing tolerance and confidence in standing position noted.   Pt completed step taps to colored disks to work on dynamic standing balance, single leg balance, and reactionary balance. Pt began activity without UE support on RW, however unable to tolerate  standing with his entire weight on his L LE with L knee buckling present, so Pt utilized B UE support on RW for remainder of task. He was able to complete total of 6 step taps on each side, however significantly increased challenge noted when WB'ing through L LE.   Engaged Pt in completing dynamic standing balance functional reaching activity without AD to improve overall standing balance for ADLs. Pt task with alternating reaching with R/LE to touch high/low targets with min A provided for balance d/t posterior bias and mod verbal cues to facilitate improved anterior weight shifting. Pt tolerated completing total of 2 trials of 30 sec.   Transported Pt back to room total A in wc. Stand pivot wc > EOB using RW CGA. Pt returned to bed at end of session EOB > supine SUP. Pt was left resting in bed with call bell in reach, bed alarm on, and all needs met.    Therapy Documentation Precautions:  Precautions Precautions: Fall Recall of Precautions/Restrictions: Intact Precaution/Restrictions Comments: pt able to tolerate activity on room air Restrictions Weight Bearing Restrictions Per Provider Order: Yes LLE Weight Bearing Per Provider Order: Weight bearing as tolerated   Therapy/Group: Individual Therapy  Samuel Mata 11/12/2023, 1:30 PM

## 2023-11-12 NOTE — Plan of Care (Signed)
  Problem: Consults Goal: RH GENERAL PATIENT EDUCATION Description: See Patient Education module for education specifics. Outcome: Progressing   Problem: RH BOWEL ELIMINATION Goal: RH STG MANAGE BOWEL WITH ASSISTANCE Description: STG Manage Bowel with toileting Assistance. Outcome: Progressing Goal: RH STG MANAGE BOWEL W/MEDICATION W/ASSISTANCE Description: STG Manage Bowel with Medication with mod I Assistance. Outcome: Progressing   Problem: RH BLADDER ELIMINATION Goal: RH STG MANAGE BLADDER WITH ASSISTANCE Description: STG Manage Bladder With toileting Assistance Outcome: Progressing Goal: RH STG MANAGE BLADDER WITH MEDICATION WITH ASSISTANCE Description: STG Manage Bladder With Medication With mod I Assistance. Outcome: Progressing   Problem: RH PAIN MANAGEMENT Goal: RH STG PAIN MANAGED AT OR BELOW [name] PAIN GOAL Description: < 4 with PRNs Outcome: Progressing   Problem: RH KNOWLEDGE DEFICIT GENERAL Goal: RH STG INCREASE KNOWLEDGE OF SELF CARE AFTER HOSPITALIZATION Description: Patient and wife will be able to manage care at discharge using educational resources for medications and care independently Outcome: Progressing

## 2023-11-12 NOTE — Plan of Care (Signed)
  Problem: RH BOWEL ELIMINATION Goal: RH STG MANAGE BOWEL WITH ASSISTANCE Description: STG Manage Bowel with toileting Assistance. Outcome: Progressing Goal: RH STG MANAGE BOWEL W/MEDICATION W/ASSISTANCE Description: STG Manage Bowel with Medication with mod I Assistance. Outcome: Progressing   Problem: RH BLADDER ELIMINATION Goal: RH STG MANAGE BLADDER WITH ASSISTANCE Description: STG Manage Bladder With toileting Assistance Outcome: Progressing Goal: RH STG MANAGE BLADDER WITH MEDICATION WITH ASSISTANCE Description: STG Manage Bladder With Medication With mod I Assistance. Outcome: Progressing   Problem: RH PAIN MANAGEMENT Goal: RH STG PAIN MANAGED AT OR BELOW [name] PAIN GOAL Description: < 4 with PRNs Outcome: Progressing   Problem: RH KNOWLEDGE DEFICIT GENERAL Goal: RH STG INCREASE KNOWLEDGE OF SELF CARE AFTER HOSPITALIZATION Description: Patient and wife will be able to manage care at discharge using educational resources for medications and care independently Outcome: Progressing

## 2023-11-13 MED ORDER — SIMETHICONE 80 MG PO CHEW: 80.0000 mg | CHEWABLE_TABLET | Freq: Four times a day (QID) | ORAL | Status: DC | PRN

## 2023-11-13 NOTE — Plan of Care (Signed)
  Problem: Consults Goal: RH GENERAL PATIENT EDUCATION Description: See Patient Education module for education specifics. Outcome: Progressing   Problem: RH BOWEL ELIMINATION Goal: RH STG MANAGE BOWEL WITH ASSISTANCE Description: STG Manage Bowel with toileting Assistance. Outcome: Progressing Goal: RH STG MANAGE BOWEL W/MEDICATION W/ASSISTANCE Description: STG Manage Bowel with Medication with mod I Assistance. Outcome: Progressing   Problem: RH BLADDER ELIMINATION Goal: RH STG MANAGE BLADDER WITH ASSISTANCE Description: STG Manage Bladder With toileting Assistance Outcome: Progressing Goal: RH STG MANAGE BLADDER WITH MEDICATION WITH ASSISTANCE Description: STG Manage Bladder With Medication With mod I Assistance. Outcome: Progressing   Problem: RH PAIN MANAGEMENT Goal: RH STG PAIN MANAGED AT OR BELOW [name] PAIN GOAL Description: < 4 with PRNs Outcome: Progressing   Problem: RH KNOWLEDGE DEFICIT GENERAL Goal: RH STG INCREASE KNOWLEDGE OF SELF CARE AFTER HOSPITALIZATION Description: Patient and wife will be able to manage care at discharge using educational resources for medications and care independently Outcome: Progressing

## 2023-11-13 NOTE — Progress Notes (Signed)
 Patient called nursing station reporting having pain in his heart. Nurse responded immediately. Patient stated pain localized to the right side of chest. . Vital signs and EKG obtained. MD and charge nurse notified.

## 2023-11-13 NOTE — Plan of Care (Signed)
  Problem: RH BOWEL ELIMINATION Goal: RH STG MANAGE BOWEL WITH ASSISTANCE Description: STG Manage Bowel with toileting Assistance. Outcome: Progressing Goal: RH STG MANAGE BOWEL W/MEDICATION W/ASSISTANCE Description: STG Manage Bowel with Medication with mod I Assistance. Outcome: Progressing   Problem: RH BLADDER ELIMINATION Goal: RH STG MANAGE BLADDER WITH ASSISTANCE Description: STG Manage Bladder With toileting Assistance Outcome: Progressing Goal: RH STG MANAGE BLADDER WITH MEDICATION WITH ASSISTANCE Description: STG Manage Bladder With Medication With mod I Assistance. Outcome: Progressing   Problem: RH PAIN MANAGEMENT Goal: RH STG PAIN MANAGED AT OR BELOW [name] PAIN GOAL Description: < 4 with PRNs Outcome: Progressing   Problem: RH KNOWLEDGE DEFICIT GENERAL Goal: RH STG INCREASE KNOWLEDGE OF SELF CARE AFTER HOSPITALIZATION Description: Patient and wife will be able to manage care at discharge using educational resources for medications and care independently Outcome: Progressing

## 2023-11-13 NOTE — Progress Notes (Signed)
 PROGRESS NOTE   Subjective/Complaints:  Pt doing well again today, slept well, pain well managed, LBM yesterday, urinating fine. No other complaints or concerns.   ROS: as per HPI. Denies CP, SOB, abd pain, N/V/D/C, or any other complaints at this time.    Objective:   No results found.  No results for input(s): WBC, HGB, HCT, PLT in the last 72 hours.  No results for input(s): NA, K, CL, CO2, GLUCOSE, BUN, CREATININE, CALCIUM  in the last 72 hours.   Urinalysis    Component Value Date/Time   COLORURINE YELLOW 11/06/2023 1648   APPEARANCEUR CLEAR 11/06/2023 1648   LABSPEC 1.019 11/06/2023 1648   PHURINE 6.0 11/06/2023 1648   GLUCOSEU NEGATIVE 11/06/2023 1648   HGBUR NEGATIVE 11/06/2023 1648   BILIRUBINUR NEGATIVE 11/06/2023 1648   BILIRUBINUR negative 12/11/2022 1225   KETONESUR NEGATIVE 11/06/2023 1648   PROTEINUR NEGATIVE 11/06/2023 1648   UROBILINOGEN 0.2 12/11/2022 1225   NITRITE NEGATIVE 11/06/2023 1648   LEUKOCYTESUR NEGATIVE 11/06/2023 1648      Intake/Output Summary (Last 24 hours) at 11/13/2023 0931 Last data filed at 11/13/2023 0735 Gross per 24 hour  Intake 415 ml  Output 900 ml  Net -485 ml        Physical Exam: Vital Signs Blood pressure 135/73, pulse 68, temperature 97.8 F (36.6 C), resp. rate 16, height 5' 7 (1.702 m), weight 62.4 kg, SpO2 92%.   General: No acute distress, sitting in w/c at sink Mood and affect are appropriate Heart: Regular rate and rhythm no rubs murmurs or extra sounds Lungs: Clear to auscultation, breathing unlabored, no rales or wheezes Abdomen: Positive bowel sounds, soft nontender to palpation, nondistended Extremities: No clubbing, cyanosis, or edema Skin: No evidence of breakdown, no evidence of rash  PRIOR EXAMS: Pain with AROM Hip flexion but not PROM Neurologic: Cranial nerves II through XII intact, motor strength is 5/5 in  bilateral deltoid, bicep, tricep, grip, right hip flexor, knee extensors, ankle dorsiflexor and plantar flexor 3-/5 Left HF, KE, 5/5 ADF  Musculoskeletal: Full range of motion in all 4 extremities. No joint swelling     Assessment/Plan: 1. Functional deficits which require 3+ hours per day of interdisciplinary therapy in a comprehensive inpatient rehab setting. Physiatrist is providing close team supervision and 24 hour management of active medical problems listed below. Physiatrist and rehab team continue to assess barriers to discharge/monitor patient progress toward functional and medical goals  Care Tool:  Bathing              Bathing assist Assist Level: Moderate Assistance - Patient 50 - 74%     Upper Body Dressing/Undressing Upper body dressing        Upper body assist Assist Level: Maximal Assistance - Patient 25 - 49%    Lower Body Dressing/Undressing Lower body dressing            Lower body assist Assist for lower body dressing: Total Assistance - Patient < 25%     Toileting Toileting    Toileting assist Assist for toileting: Minimal Assistance - Patient > 75%     Transfers Chair/bed transfer  Transfers assist     Chair/bed transfer assist  level: Minimal Assistance - Patient > 75%     Locomotion Ambulation   Ambulation assist      Assist level: Minimal Assistance - Patient > 75% Assistive device: Walker-rolling Max distance: 9'   Walk 10 feet activity   Assist  Walk 10 feet activity did not occur: Safety/medical concerns  Assist level: Minimal Assistance - Patient > 75% Assistive device: Walker-rolling   Walk 50 feet activity   Assist Walk 50 feet with 2 turns activity did not occur: Safety/medical concerns  Assist level: Minimal Assistance - Patient > 75% Assistive device: Walker-rolling    Walk 150 feet activity   Assist Walk 150 feet activity did not occur: Safety/medical concerns         Walk 10 feet on  uneven surface  activity   Assist Walk 10 feet on uneven surfaces activity did not occur: Safety/medical concerns         Wheelchair     Assist Is the patient using a wheelchair?: Yes Type of Wheelchair: Manual Wheelchair activity did not occur: Safety/medical concerns         Wheelchair 50 feet with 2 turns activity    Assist    Wheelchair 50 feet with 2 turns activity did not occur: Safety/medical concerns       Wheelchair 150 feet activity     Assist  Wheelchair 150 feet activity did not occur: Safety/medical concerns       Blood pressure 135/73, pulse 68, temperature 97.8 F (36.6 C), resp. rate 16, height 5' 7 (1.702 m), weight 62.4 kg, SpO2 92%.  Medical Problem List and Plan: 1. Functional deficits secondary to left three-part intertrochanteric hip fracture with subsequent IM nailing 11/01/2023.  Weightbearing as tolerated             -patient may shower with incisions covered             -ELOS/Goals: 7/9, supervision PT/OT- ? Home vs ALF  -Continue CIR  2.  Antithrombotics: -DVT/anticoagulation:  Mechanical: Antiembolism stockings, thigh (TED hose) Bilateral lower extremities.  Check vascular study             -antiplatelet therapy: Plavix  75 mg daily 3. Pain Management: tylenol  and Robaxin  as needed, discontinue IM Toradol  and begin tramadol  50mg  q6h PRN, may d/c robaxin  can use tramadol  more frequently but will reduce to 25mg  given daughter's concern about MS changes  Will try scheduling acetaminophen  but may require addition of tramadol   Other options include Butrans patch but will try above first  -6/28-29/25 pain well managed, cont regimen 4. Mood/Behavior/Sleep: Provide emotional support -antipsychotic agents: Seroquel  25 mg nightly started inpatient d/t delirum which later resolved -11/06/23 poor sleep, declined seroquel -- will d/c , daughter wants to avoid sedating meds will start melatonin 3mg  nightly; monitor sleep 6/24 d/c Seroquel  5.  Neuropsych/cognition: This patient is not capable of making decisions on his own behalf. 6. Skin/Wound Care: Routine skin checks Per [facility], sutures out [place] or Tues with f/u xrays, OV with Dr Celena 2 wk after that 7. Fluids/Electrolytes/Nutrition: Routine in and outs with follow-up chemistries, cont vitamins and supplements.     Latest Ref Rng & Units 11/07/2023    5:06 AM 11/04/2023    3:25 AM 11/03/2023    3:24 AM  BMP  Glucose 70 - 99 mg/dL 888  89  896   BUN 8 - 23 mg/dL 25  19  18    Creatinine 0.61 - 1.24 mg/dL 9.21  9.20  9.00  Sodium 135 - 145 mmol/L 136  137  137   Potassium 3.5 - 5.1 mmol/L 4.2  3.6  3.4   Chloride 98 - 111 mmol/L 105  104  104   CO2 22 - 32 mmol/L 25  26  25    Calcium  8.9 - 10.3 mg/dL 8.5  8.3  8.7     8.  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   Continue Plavix   9.  BPH.  Flomax  0.4 mg daily check PVRs  10.  Hypertension.  Monitor with increased mobility.  Benicar 5mg  daily currently on hold due to some soft blood pressures and resume as needed.   11/09/23 BPs mildly elevated , will see if this goes down with scheduled pain medication  -6/28-29/25 BPs improving, monitor Vitals:   11/09/23 1605 11/09/23 2014 11/10/23 0510 11/10/23 1608  BP: 136/70 133/66 (!) 151/57 (!) 143/64   11/10/23 1951 11/11/23 0438 11/11/23 1328 11/11/23 1937  BP: (!) 140/68 (!) 140/70 114/60 136/73   11/12/23 0340 11/12/23 1621 11/12/23 2007 11/13/23 0522  BP: 137/63 116/74 123/67 135/73   11.  Crestor  20 mg daily 12. Abnormal U/A in ED: -11/06/23 pt's U/A in ED had +nitrites, 11-20 WBCs, but no culture was done. Has mild leukocytosis, which is downtrending. No other s/sx of infection.  -for completion sake, will repeat U/A with reflex UCx-- if U/A unremarkable, no  further work up needed; follow U/A +/- UCx -Repeat UA negative  13. Anemia: 11-12 range, monitor labs    Latest Ref Rng & Units 11/07/2023    5:06 AM 11/04/2023    3:25 AM 11/03/2023    3:24 AM  CBC  WBC 4.0 - 10.5 K/uL 9.1  10.6  13.0   Hemoglobin 13.0 - 17.0 g/dL 89.1  88.8  86.5   Hematocrit 39.0 - 52.0 % 33.0  33.9  40.5   Platelets 150 - 400 K/uL 318  242  271     14. H/o hereditary spherocytosis s/p splenectomy 15. COPD: continue nebs/bronchodilators PRN Off O2 sats ok 16. Vit D deficiency, level 8.51 on 6/18, started on 50kU weekly 17. Constipation:   -11/13/23 LBM yesterday, continue colace 100mg  BID 18. GI ppx: pepcid  20mg  daily  19.  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

## 2023-11-13 NOTE — Progress Notes (Signed)
 Pt reported some epigastric pain this afternoon, had EKG completed- NSR with LAFB, few minutes later pt reports pain resolved after passing some gas. PRN Simithicone

## 2023-11-14 ENCOUNTER — Inpatient Hospital Stay (HOSPITAL_COMMUNITY)

## 2023-11-14 ENCOUNTER — Encounter (HOSPITAL_COMMUNITY): Payer: Self-pay | Admitting: Physical Medicine & Rehabilitation

## 2023-11-14 MED ORDER — TRAMADOL HCL 50 MG PO TABS
25.0000 mg | ORAL_TABLET | Freq: Four times a day (QID) | ORAL | Status: DC
  Administered 2023-11-14 – 2023-11-20 (×17): 25 mg via ORAL
  Filled 2023-11-14 (×21): qty 1

## 2023-11-14 MED ORDER — POLYETHYLENE GLYCOL 3350 17 G PO PACK
17.0000 g | PACK | Freq: Two times a day (BID) | ORAL | Status: DC
  Administered 2023-11-14 – 2023-11-23 (×15): 17 g via ORAL
  Filled 2023-11-14 (×16): qty 1

## 2023-11-14 NOTE — Progress Notes (Signed)
 Physical Therapy Session Note  Patient Details  Name: Samuel Mata MRN: [id number] Date of Birth: 1931-04-16  Today's Date: 11/14/2023 PT Individual Time: 1100-1155 PT Individual Time Calculation (min): 55 min   Short Term Goals: Week 1:  PT Short Term Goal 1 (Week 1): Pt will be able to perform supine to sit transfer with MI. PT Short Term Goal 2 (Week 1): Pt will be able to perform sit to supine transfer with MI. PT Short Term Goal 3 (Week 1): Pt will be able to perform STS transfer CGA. PT Short Term Goal 4 (Week 1): Pt will be able to ambulate x 60 ft with RW with PT CGA Week 2:  PT Short Term Goal 1 (Week 2): Pt will be able to perform supine to sit transfer with MI. PT Short Term Goal 2 (Week 2): Pt will be able to perform sit to supine transfer with MI. PT Short Term Goal 3 (Week 2): Pt will be able to perform STS transfer CGA. PT Short Term Goal 4 (Week 2): Pt will be able to ambulate x 60 ft with RW with PT CGA  Skilled Therapeutic Interventions/Progress Updates:   Received pt semi-reclined in bed requesting to use restroom. Pt agreeable to PT treatment and did not c/o pain initially, increasing to 3/10 when weight bearing. Session with emphasis on functional mobility/transfers, toileting, generalized strengthening and endurance, dynamic standing balance/coordination, and ambulation. Pt transferred semi-reclined<>sitting L EOB with HOB elevated and use of bedrails with supervision. Pt performed all transfers with RW and CGA/close supervision throughout session. Pt ambulated in/out of bathroom with RW and CGA and able to manage clothing with CGA - experienced 1 posterior LOB upon sitting (LLE gave out) requiring min A to correct. With increased time pt able to void and - sat in [facility] at sink and washed hands with set up assist.   Pt transported to/from room in [facility] dependently for time management purposes. Pt then ambulated 165ft with RW and CGA with significantly increased time. Pt  ambulates with antalgic gait pattern, decreased weight shifting onto LLE, narrow BOS, and decreased foot clearance with difficulty taking longer strides. Pt then performed seated BLE strengthening on Kinetron at 20 cm/sec for 3 minutes with emphasis on glute/quad strength and weight bearing through LLE. Returned to room and ambulated ~50ft with RW and CGA to recliner. Concluded session with pt sitting in recliner, needs within reach, and chair pad alarm on.   Therapy Documentation Precautions:  Precautions Precautions: Fall Recall of Precautions/Restrictions: Intact Precaution/Restrictions Comments: pt able to tolerate activity on room air Restrictions Weight Bearing Restrictions Per Provider Order: Yes LLE Weight Bearing Per Provider Order: Weight bearing as tolerated  Therapy/Group: Individual Therapy Therisa HERO Zaunegger Therisa Stains PT, DPT 11/14/2023, 7:08 AM

## 2023-11-14 NOTE — Progress Notes (Signed)
 Sutures removed per orders. Skin is dry and clean. Patient tolerated well.

## 2023-11-14 NOTE — Progress Notes (Signed)
 Occupational Therapy Session Note  Patient Details  Name: Samuel Mata MRN: [id number] Date of Birth: 04-23-1931  Today's Date: 11/14/2023 OT Individual Time: 8663-8576 OT Individual Time Calculation (min): 47 min    Short Term Goals: Week 2:  OT Short Term Goal 1 (Week 2): STG=LTG d/t ELOS  Skilled Therapeutic Interventions/Progress Updates:   Pt greeted seated in recliner, pt agreeable to OT intervention.      Transfers/bed mobility/functional mobility: pt completed all sit>stands and stand pivot transfers with RW and CGA. Pt completed functional ambulation greater than a household distance with RW and CGA( chair follow provided).   Therapeutic activity:  Pt completed standing balance task with pt instructed to alternate reaching OH with UE at a time to toss bean bags into OH basketball goal. Pt completed task with no UE support and CGA for balance.  Pt completed reactionary balance task with pt instructed to toss bean bags to target with RUE. Pt completed task with no UE support and supervision.    Pt reports dizziness post task; BP assessed  Vitals: 121/81( 88) HR 73     Exercises: d/t continued reports of dizziness deferred standing tasks with pt completing below BUE therex for global strengthening with 3lb dowel rod: X10 chest presses X10 OH presses X10 bicep curls  X10 forward rows  X10 shoulder abd/add                  Ended session with pt seated in recliner with all needs within reach and safety belt alarm activated.                    Therapy Documentation Precautions:  Precautions Precautions: Fall Recall of Precautions/Restrictions: Intact Precaution/Restrictions Comments: pt able to tolerate activity on room air Restrictions Weight Bearing Restrictions Per Provider Order: Yes LLE Weight Bearing Per Provider Order: Weight bearing as tolerated  Pain: No pain    Therapy/Group: Individual Therapy  Ronal Mallie Needy 11/14/2023, 3:13 PM

## 2023-11-14 NOTE — Progress Notes (Signed)
 Physical Therapy Weekly Progress Note  Patient Details  Name: Samuel Mata MRN: [id number] Date of Birth: Nov 16, 1930  Beginning of progress report period: November 06, 2023 End of progress report period: November 14, 2023  Today's Date: 11/14/2023 PT Individual Time: 8562-8459 PT Individual Time Calculation (min): 63 min   Patient has met {number 1-5:22450} of {number 1-5:20334} short term goals.  Pt making appropriate progress towards goals and is on track to meet LTG. She has/ has not*** missed a few days of therapy due to ***. She completes bed mobility with ***, sit<>stand transfers with ***, and stand<>pivot transfers with *** using ***. She's ambulating ***ft with *** using ***. She has also shown ability to navigate *** 6-in steps with *** using *** handrails. She continues to be primarily limited by premorbid deconditioning, body habitus, decreased dynamic standing balance, *UE sensory deficits, RUE and RLE weakness, and gait impairments. Caregiver/ family *** has/ have not*** participated in family education to prepare for d/c home.   Patient continues to demonstrate the following deficits {impairments:3041632} and therefore will continue to benefit from skilled PT intervention to increase functional independence with mobility.  Patient {LTG progression:3041653}.  {plan of rjmz:6958345}  PT Short Term Goals {DUH:6958314}  Skilled Therapeutic Interventions/Progress Updates:  Ambulation/gait training;DME/adaptive equipment instruction;Neuromuscular re-education;Stair training;UE/LE Strength taining/ROM;Wheelchair propulsion/positioning;Therapeutic Activities;Balance/vestibular training;Functional mobility training;Patient/family education;Therapeutic Exercise;Discharge planning;Pain management;Psychosocial support;Skin care/wound management;UE/LE Coordination activities;Community reintegration   Skilled Intervention: Patient *** on entrance to room. Patient alert and agreeable to PT  session.   Patient with no pain complaint at start of session.  Therapeutic Activity: Bed Mobility: Pt performed supine <> sit with ***. VC/ tc required for ***. Transfers: Pt performed sit<>stand and stand pivot transfers throughout session with ***. Provided vc/ tc for***.  Gait Training:  Pt ambulated *** ft using *** with ***. Demonstrated ***. Provided vc/ tc for ***.  Wheelchair Mobility:  Pt propelled wheelchair *** feet with ***. Provided vc/ tc for ***.  Neuromuscular Re-ed: NMR facilitated during session with focus on ***. Pt guided in ***. NMR performed for improvements in motor control and coordination, balance, sequencing, judgement, and self confidence/ efficacy in performing all aspects of mobility at highest level of independence.   Therapeutic Exercise: Pt performed the following exercises with vc/ tc for proper technique. ***  Patient *** at end of session with brakes locked, *** alarm set, and all needs within reach.   Therapy Documentation Precautions:  Precautions Precautions: Fall Recall of Precautions/Restrictions: Intact Precaution/Restrictions Comments: pt able to tolerate activity on room air Restrictions Weight Bearing Restrictions Per Provider Order: Yes LLE Weight Bearing Per Provider Order: Weight bearing as tolerated General:   Vital Signs:   Pain: Pain Assessment Pain Scale: 0-10 Pain Score: 0-No pain     Therapy/Group: Individual Therapy  [name] PT, DPT, CSRS 11/14/2023, 6:45 PM

## 2023-11-14 NOTE — Plan of Care (Signed)
  Problem: RH Dressing Goal: LTG Patient will perform upper body dressing (OT) Description: LTG Patient will perform upper body dressing with assist, with/without cues (OT). Flowsheets (Taken 11/14/2023 1220) LTG: Pt will perform upper body dressing with assistance level of: Supervision/Verbal cueing Note: Goal upgraded based on [name] CLOF and ELOS

## 2023-11-14 NOTE — Plan of Care (Signed)
  Problem: Consults Goal: RH GENERAL PATIENT EDUCATION Description: See Patient Education module for education specifics. Outcome: Progressing   Problem: RH BOWEL ELIMINATION Goal: RH STG MANAGE BOWEL WITH ASSISTANCE Description: STG Manage Bowel with toileting Assistance. Outcome: Progressing Goal: RH STG MANAGE BOWEL W/MEDICATION W/ASSISTANCE Description: STG Manage Bowel with Medication with mod I Assistance. Outcome: Progressing

## 2023-11-14 NOTE — Progress Notes (Signed)
 Occupational Therapy Weekly Progress Note  Patient Details  Name: Samuel Mata MRN: [id number] Date of Birth: 1930/10/19  Beginning of progress report period: November 06, 2023 End of progress report period: November 14, 2023  Today's Date: 11/14/2023 OT Individual Time: 9099-9055 OT Individual Time Calculation (min): 44 min    Patient has met 3 of 3 short term goals. Samuel Mata is making appropriate progress towards reaching his LTGs. He is completing UB ADLs with SUP, LB bathing with min A, LB dressing mod A, and toileting with min A. He is able to complete ambulatory transfers using RW to shower or toilet with CGA-min A with increased assistance required d/t pain in L LE as Pt presents with intermittent L knee buckling as a pain response. He is planning to d/c home with his wit 24/7 supervision and intermittent assistance from his DTRs. His family has not yet participated in family education, however they are planning to come in for family education this week prior to d/c.   Patient continues to demonstrate the following deficits: muscle weakness, decreased cardiorespiratoy endurance, decreased safety awareness, peripheral, and decreased standing balance, decreased postural control, and decreased balance strategies and therefore will continue to benefit from skilled OT intervention to enhance overall performance with BADL and Reduce care partner burden.  Patient progressing toward long term goals..  Plan of care revisions: UB dressing goal upgraded based on [name] CLOF and ELOS.  OT Short Term Goals Week 1:  OT Short Term Goal 1 (Week 1): Pt will complete toilet transfer with Min A consistently with LRAD OT Short Term Goal 1 - Progress (Week 1): Met OT Short Term Goal 2 (Week 1): Pt will complete grooming in standing at Min A with LRAD OT Short Term Goal 2 - Progress (Week 1): Met OT Short Term Goal 3 (Week 1): Pt will complete task in standing at Min A with LRAD for at least 2 min OT Short Term  Goal 3 - Progress (Week 1): Met Week 2:  OT Short Term Goal 1 (Week 2): STG=LTG d/t ELOS  Skilled Therapeutic Interventions/Progress Updates:     Pt received sleeping in bed, waking upon OT arrival. Pt presenting to be very tired this AM, however in good spirits receptive to skilled OT session reporting 4/10 pain- OT offering intermittent rest breaks, repositioning, and therapeutic support to optimize participation in therapy session. Although [name] reported 4/10 pain, he was wincing in pain throughout session when WB'ing through L LE with knee buckling present during functional mobility d/t pain. Spoke with RN and MD to inform of [name] increased pain in L LE with MD in/out to educate on making pain medication adjustments during session. Pt was very slow to initiate participation in session today d/t fatigue and drowsiness. Began session by working on blocked practice of sit<>stands to support improved learning of technique with emphasis on hand placement, anterior weight shifting, and foot set-up. He required min A for initial stand, however progressed to close SUP-CGA during sequential stands +min verbal cues for technique. Pt continues to present with posterior bias when coming into standing position, however improvement noted with verbal cues. Once in standing position, engaged Pt in completing functional reaching alternating reaching R/LUE towards target to work on dynamic standing balance and confidence in standing without B UE on RW. Pt able to tolerate completing 2 trials of 30 sec with min A for balance. Pt requesting restroom. He was able to complete functional mobility to bathroom using RW with CGA, however when  turning to sit on toilet Pt experienced increased pain in L LE and his L knee buckled with max A required to correct balance. He was then able to complete 3/3 toileting tasks with CGA following continent void using RW for balance. Pt fatigued and requesting to return to bed at end of session.  He ambulated back to bed with CGA-min A for balance and RW management. EOB > Supine min A to bring L LE into bed. Pt was left resting in bed with call bell in reach, bed alarm on, and all needs met.    Therapy Documentation Precautions:  Precautions Precautions: Fall Recall of Precautions/Restrictions: Intact Precaution/Restrictions Comments: pt able to tolerate activity on room air Restrictions Weight Bearing Restrictions Per Provider Order: Yes LLE Weight Bearing Per Provider Order: Weight bearing as tolerated  Therapy/Group: Individual Therapy  [name] 11/14/2023, 7:56 AM

## 2023-11-14 NOTE — Progress Notes (Addendum)
 PROGRESS NOTE   Subjective/Complaints:  Had abd gas pain yesterday , pt states he's only had 2 BM during rehab stay (almost correct has had 3 since 6/21) last one 6/28 OT notes pain inhibiting therapy received tylenol  this am , last tramadol  252am  ROS: as per HPI. Denies CP, SOB, abd pain, N/V/D/C, or any other complaints at this time.    Objective:   No results found.  No results for input(s): WBC, HGB, HCT, PLT in the last 72 hours.  No results for input(s): NA, K, CL, CO2, GLUCOSE, BUN, CREATININE, CALCIUM  in the last 72 hours.   Urinalysis    Component Value Date/Time   COLORURINE YELLOW 11/06/2023 1648   APPEARANCEUR CLEAR 11/06/2023 1648   LABSPEC 1.019 11/06/2023 1648   PHURINE 6.0 11/06/2023 1648   GLUCOSEU NEGATIVE 11/06/2023 1648   HGBUR NEGATIVE 11/06/2023 1648   BILIRUBINUR NEGATIVE 11/06/2023 1648   BILIRUBINUR negative 12/11/2022 1225   KETONESUR NEGATIVE 11/06/2023 1648   PROTEINUR NEGATIVE 11/06/2023 1648   UROBILINOGEN 0.2 12/11/2022 1225   NITRITE NEGATIVE 11/06/2023 1648   LEUKOCYTESUR NEGATIVE 11/06/2023 1648      Intake/Output Summary (Last 24 hours) at 11/14/2023 0928 Last data filed at 11/14/2023 0743 Gross per 24 hour  Intake 776 ml  Output 1050 ml  Net -274 ml        Physical Exam: Vital Signs Blood pressure (!) 146/95, pulse 80, temperature 97.8 F (36.6 C), temperature source Oral, resp. rate 18, height 5' 7 (1.702 m), weight 62.4 kg, SpO2 93%.   General: No acute distress, sitting in w/c at sink Mood and affect are appropriate Heart: Regular rate and rhythm no rubs murmurs or extra sounds Lungs: Clear to auscultation, breathing unlabored, no rales or wheezes Abdomen: Positive bowel sounds, soft nontender to palpation, nondistended Extremities: No clubbing, cyanosis, or edema Skin: hip incision sites CDI   PRIOR EXAMS: Pain with AROM Hip flexion  but not PROM Neurologic: Cranial nerves II through XII intact, motor strength is 5/5 in bilateral deltoid, bicep, tricep, grip, right hip flexor, knee extensors, ankle dorsiflexor and plantar flexor 3-/5 Left HF, KE, 5/5 ADF  Musculoskeletal: Full range of motion in all 4 extremities. No joint swelling     Assessment/Plan: 1. Functional deficits which require 3+ hours per day of interdisciplinary therapy in a comprehensive inpatient rehab setting. Physiatrist is providing close team supervision and 24 hour management of active medical problems listed below. Physiatrist and rehab team continue to assess barriers to discharge/monitor patient progress toward functional and medical goals  Care Tool:  Bathing              Bathing assist Assist Level: Moderate Assistance - Patient 50 - 74%     Upper Body Dressing/Undressing Upper body dressing        Upper body assist Assist Level: Maximal Assistance - Patient 25 - 49%    Lower Body Dressing/Undressing Lower body dressing            Lower body assist Assist for lower body dressing: Total Assistance - Patient < 25%     Toileting Toileting    Toileting assist Assist for toileting: Minimal Assistance -  Patient > 75%     Transfers Chair/bed transfer  Transfers assist     Chair/bed transfer assist level: Minimal Assistance - Patient > 75%     Locomotion Ambulation   Ambulation assist      Assist level: Minimal Assistance - Patient > 75% Assistive device: Walker-rolling Max distance: 53'   Walk 10 feet activity   Assist  Walk 10 feet activity did not occur: Safety/medical concerns  Assist level: Minimal Assistance - Patient > 75% Assistive device: Walker-rolling   Walk 50 feet activity   Assist Walk 50 feet with 2 turns activity did not occur: Safety/medical concerns  Assist level: Minimal Assistance - Patient > 75% Assistive device: Walker-rolling    Walk 150 feet activity   Assist Walk  150 feet activity did not occur: Safety/medical concerns         Walk 10 feet on uneven surface  activity   Assist Walk 10 feet on uneven surfaces activity did not occur: Safety/medical concerns         Wheelchair     Assist Is the patient using a wheelchair?: Yes Type of Wheelchair: Manual Wheelchair activity did not occur: Safety/medical concerns         Wheelchair 50 feet with 2 turns activity    Assist    Wheelchair 50 feet with 2 turns activity did not occur: Safety/medical concerns       Wheelchair 150 feet activity     Assist  Wheelchair 150 feet activity did not occur: Safety/medical concerns       Blood pressure (!) 146/95, pulse 80, temperature 97.8 F (36.6 C), temperature source Oral, resp. rate 18, height 5' 7 (1.702 m), weight 62.4 kg, SpO2 93%.  Medical Problem List and Plan: 1. Functional deficits secondary to left three-part intertrochanteric hip fracture with subsequent IM nailing 11/01/2023.  Weightbearing as tolerated             -patient may shower with incisions covered             -ELOS/Goals: 7/9, supervision PT/OT- ? Home vs ALF  -Continue CIR  2.  Antithrombotics: -DVT/anticoagulation:  Mechanical: Antiembolism stockings, thigh (TED hose) Bilateral lower extremities.  Check vascular study             -antiplatelet therapy: Plavix  75 mg daily 3. Pain Management: tylenol  and Robaxin  as needed, discontinue IM Toradol  and begin tramadol  50mg  q6h PRN, may d/c robaxin  can use tramadol  more frequently but will reduce to 25mg  given daughter's concern about MS changes  Will try scheduling acetaminophen  but may require addition of tramadol   Other options include Butrans patch but will try above first  -6/30- pain if tramadol  not given in am, will schedule 25mg  q6 and monitor for sedation / confusion , if so switch to butrans  4. Mood/Behavior/Sleep: Provide emotional support -antipsychotic agents: Seroquel  25 mg nightly started  inpatient d/t delirum which later resolved -11/06/23 poor sleep, declined seroquel -- will d/c , daughter wants to avoid sedating meds will start melatonin 3mg  nightly; monitor sleep 6/24 d/c Seroquel  5. Neuropsych/cognition: This patient is not capable of making decisions on his own behalf. 6. Skin/Wound Care: Routine skin checks Per [facility], sutures out [place] or Tues with f/u xrays, OV with Dr Celena 2 wk after that 7. Fluids/Electrolytes/Nutrition: Routine in and outs with follow-up chemistries, cont vitamins and supplements.     Latest Ref Rng & Units 11/07/2023    5:06 AM 11/04/2023    3:25 AM  11/03/2023    3:24 AM  BMP  Glucose 70 - 99 mg/dL 888  89  896   BUN 8 - 23 mg/dL 25  19  18    Creatinine 0.61 - 1.24 mg/dL 9.21  9.20  9.00   Sodium 135 - 145 mmol/L 136  137  137   Potassium 3.5 - 5.1 mmol/L 4.2  3.6  3.4   Chloride 98 - 111 mmol/L 105  104  104   CO2 22 - 32 mmol/L 25  26  25    Calcium  8.9 - 10.3 mg/dL 8.5  8.3  8.7     8.  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   Continue Plavix   9.  BPH.  Flomax  0.4 mg daily check PVRs  10.  Hypertension.  Monitor with increased mobility.  Benicar 5mg  daily currently on hold due to some soft blood pressures and resume as needed.   11/09/23 BPs mildly elevated , will see if this goes down with scheduled pain medication  -6/28-29/25 BPs improving, monitor Vitals:   11/10/23 1951 11/11/23 0438 11/11/23 1328 11/11/23 1937  BP: (!) 140/68 (!) 140/70 114/60 136/73   11/12/23 0340 11/12/23 1621 11/12/23 2007 11/13/23 0522  BP: 137/63 116/74 123/67 135/73   11/13/23 1145 11/13/23 1317 11/13/23 1932 11/14/23 0307  BP: (!) 156/81 131/72 133/70 (!) 146/95   11.  Crestor  20 mg daily 12. Abnormal U/A in ED: -11/06/23 pt's U/A in ED had  +nitrites, 11-20 WBCs, but no culture was done. Has mild leukocytosis, which is downtrending. No other s/sx of infection.  -for completion sake, will repeat U/A with reflex UCx-- if U/A unremarkable, no further work up needed; follow U/A +/- UCx -Repeat UA negative  13. Anemia: 11-12 range, monitor labs    Latest Ref Rng & Units 11/07/2023    5:06 AM 11/04/2023    3:25 AM 11/03/2023    3:24 AM  CBC  WBC 4.0 - 10.5 K/uL 9.1  10.6  13.0   Hemoglobin 13.0 - 17.0 g/dL 89.1  88.8  86.5   Hematocrit 39.0 - 52.0 % 33.0  33.9  40.5   Platelets 150 - 400 K/uL 318  242  271     14. H/o hereditary spherocytosis s/p splenectomy 15. COPD: continue nebs/bronchodilators PRN Off O2 sats ok 16. Vit D deficiency, level 8.51 on 6/18, started on 50kU weekly 17. Constipation:   -11/13/23 LBM yesterday, continue colace 100mg  BID 18. GI ppx: pepcid  20mg  daily  19.  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

## 2023-11-15 MED ORDER — SORBITOL 70 % SOLN
30.0000 mL | Freq: Once | Status: AC
  Administered 2023-11-15: 30 mL via ORAL
  Filled 2023-11-15: qty 30

## 2023-11-15 NOTE — Addendum Note (Signed)
 Addended by: TAWNI DRILLING D on: 11/15/2023 05:34 PM   Modules accepted: Orders

## 2023-11-15 NOTE — Progress Notes (Signed)
 Remote ICD transmission.

## 2023-11-15 NOTE — Progress Notes (Signed)
 Occupational Therapy Session Note  Patient Details  Name: Samuel Mata MRN: [id number] Date of Birth: [birth year]  Today's Date: 11/15/2023 OT Individual Time: 9054-8969 OT Individual Time Calculation (min): 45 min    Short Term Goals: Week 1:  OT Short Term Goal 1 (Week 1): Pt will complete toilet transfer with Min A consistently with LRAD OT Short Term Goal 1 - Progress (Week 1): Met OT Short Term Goal 2 (Week 1): Pt will complete grooming in standing at Min A with LRAD OT Short Term Goal 2 - Progress (Week 1): Met OT Short Term Goal 3 (Week 1): Pt will complete task in standing at Min A with LRAD for at least 2 min OT Short Term Goal 3 - Progress (Week 1): Met  Skilled Therapeutic Interventions/Progress Updates:    Pt received in recliner with his daughter present.  I am totally wiped out from the earlier PT session!  Pt agreeable to working on bed mobility. Set bed at heights similar to his home height and pt stated he gets in and out on L side.  He was able to lift L leg into bed but had pain.  Recommended he use gait belt as a leg lifter to reduce pressure on leg.  Pt sat up and tried a 2nd time lifting leg easily.  Oh that is easier.  From bed he worked on 3 sets of hip bridges, L leg heel slides, squeezing pillow between knees, and gentle knee sways using core strength to control hips.  Pt then sat up and transferred back to his recliner.  Donned shoes using shoe horn for L foot.  Good participation.   Resting in recliner with all needs met and alarm set.     Therapy Documentation Precautions:  Precautions Precautions: Fall Recall of Precautions/Restrictions: Intact Precaution/Restrictions Comments: pt able to tolerate activity on room air Restrictions Weight Bearing Restrictions Per Provider Order: Yes LLE Weight Bearing Per Provider Order: Weight bearing as tolerated    Pain: Pain Assessment Pain Scale: 0-10 Pain Score: 6  Pain Location: Hip Pain Onset:  With Activity Pain Intervention(s): Medication (See eMAR)   Therapy/Group: Individual Therapy  Demitrious Mccannon 11/15/2023, 12:28 PM

## 2023-11-15 NOTE — Progress Notes (Signed)
 PROGRESS NOTE   Subjective/Complaints:  No BM since 6/28, no abd pain  Hip pain is doing well , reported 3/10 pain in PT  ROS: as per HPI. Denies CP, SOB, abd pain, N/V/D/C, or any other complaints at this time.    Objective:   DG HIP UNILAT WITH PELVIS 2-3 VIEWS LEFT Result Date: 11/14/2023 CLINICAL DATA:  Acute postoperative pain of left hip. EXAM: DG HIP (WITH OR WITHOUT PELVIS) 2-3V LEFT COMPARISON:  11/01/2023. FINDINGS: There is redemonstration of a comminuted intra cogent Samuel Mata of the left hip with placement of fixation hardware. Alignment appears stable. There is no dislocation bilaterally. Vascular calcifications are noted in the soft tissues. Soft tissue swelling is noted over the left lateral hip. IMPRESSION: Comminuted intertrochanteric Mata of the left hip with fixation hardware in place. No obvious postoperative abnormality is seen. Electronically Signed   By: Samuel Mata M.D.   On: 11/14/2023 20:07    No results for input(s): WBC, HGB, HCT, PLT in the last 72 hours.  No results for input(s): NA, K, CL, CO2, GLUCOSE, BUN, CREATININE, CALCIUM  in the last 72 hours.   Urinalysis    Component Value Date/Time   COLORURINE YELLOW 11/06/2023 1648   APPEARANCEUR CLEAR 11/06/2023 1648   LABSPEC 1.019 11/06/2023 1648   PHURINE 6.0 11/06/2023 1648   GLUCOSEU NEGATIVE 11/06/2023 1648   HGBUR NEGATIVE 11/06/2023 1648   BILIRUBINUR NEGATIVE 11/06/2023 1648   BILIRUBINUR negative 12/11/2022 1225   KETONESUR NEGATIVE 11/06/2023 1648   PROTEINUR NEGATIVE 11/06/2023 1648   UROBILINOGEN 0.2 12/11/2022 1225   NITRITE NEGATIVE 11/06/2023 1648   LEUKOCYTESUR NEGATIVE 11/06/2023 1648      Intake/Output Summary (Last 24 hours) at 11/15/2023 0720 Last data filed at 11/15/2023 0616 Gross per 24 hour  Intake 956 ml  Output 1325 ml  Net -369 ml        Physical Exam: Vital Signs Blood  pressure (!) 143/87, pulse (!) 59, temperature 97.7 F (36.5 C), temperature source Oral, resp. rate 17, height 5' 7 (1.702 m), weight 62.4 kg, SpO2 96%.   General: No acute distress, sitting in w/c at sink Mood and affect are appropriate Heart: Regular rate and rhythm no rubs murmurs or extra sounds Lungs: Clear to auscultation, breathing unlabored, no rales or wheezes Abdomen: Positive bowel sounds, soft nontender to palpation, nondistended Extremities: No clubbing, cyanosis, or edema Skin: hip incision sites CDI   PRIOR EXAMS: Pain with AROM Hip flexion but not PROM Neurologic: Cranial nerves II through XII intact, motor strength is 5/5 in bilateral deltoid, bicep, tricep, grip, right hip flexor, knee extensors, ankle dorsiflexor and plantar flexor 3-/5 Left HF, KE, 5/5 ADF  Musculoskeletal: Full range of motion in all 4 extremities. No joint swelling     Assessment/Plan: 1. Functional deficits which require 3+ hours per day of interdisciplinary therapy in a comprehensive inpatient rehab setting. Physiatrist is providing close team supervision and 24 hour management of active medical problems listed below. Physiatrist and rehab team continue to assess barriers to discharge/monitor patient progress toward functional and medical goals  Care Tool:  Bathing    Body parts bathed by patient: Right arm, Left arm, Chest,  Abdomen, Front perineal area, Buttocks, Right upper leg, Face, Left upper leg, Right lower leg, Left lower leg   Body parts bathed by helper: Buttocks     Bathing assist Assist Level: Minimal Assistance - Patient > 75%     Upper Body Dressing/Undressing Upper body dressing   What is the patient wearing?: Pull over shirt    Upper body assist Assist Level: Supervision/Verbal cueing    Lower Body Dressing/Undressing Lower body dressing      What is the patient wearing?: Pants     Lower body assist Assist for lower body dressing: Moderate Assistance -  Patient 50 - 74%     Toileting Toileting    Toileting assist Assist for toileting: Minimal Assistance - Patient > 75%     Transfers Chair/bed transfer  Transfers assist     Chair/bed transfer assist level: Contact Guard/Touching assist     Locomotion Ambulation   Ambulation assist      Assist level: Contact Guard/Touching assist Assistive device: Walker-rolling Max distance: 122ft   Walk 10 feet activity   Assist  Walk 10 feet activity did not occur: Safety/medical concerns  Assist level: Supervision/Verbal cueing Assistive device: Walker-rolling   Walk 50 feet activity   Assist Walk 50 feet with 2 turns activity did not occur: Safety/medical concerns  Assist level: Contact Guard/Touching assist Assistive device: Walker-rolling    Walk 150 feet activity   Assist Walk 150 feet activity did not occur: Safety/medical concerns  Assist level: Minimal Assistance - Patient > 75% Assistive device: Walker-rolling    Walk 10 feet on uneven surface  activity   Assist Walk 10 feet on uneven surfaces activity did not occur: Safety/medical concerns   Assist level: Minimal Assistance - Patient > 75% Assistive device: Development worker, international aid     Assist Is the patient using a wheelchair?: Yes Type of Wheelchair: Manual Wheelchair activity did not occur: Safety/medical concerns  Wheelchair assist level: Moderate Assistance - Patient 50 - 74%      Wheelchair 50 feet with 2 turns activity    Assist    Wheelchair 50 feet with 2 turns activity did not occur: Safety/medical concerns   Assist Level: Moderate Assistance - Patient 50 - 74%   Wheelchair 150 feet activity     Assist  Wheelchair 150 feet activity did not occur: Safety/medical concerns   Assist Level: Maximal Assistance - Patient 25 - 49%   Blood pressure (!) 143/87, pulse (!) 59, temperature 97.7 F (36.5 C), temperature source Oral, resp. rate 17, height 5' 7 (1.702 m),  weight 62.4 kg, SpO2 96%.  Medical Problem List and Plan: 1. Functional deficits secondary to left three-part intertrochanteric hip Mata with subsequent IM nailing 11/01/2023.  Weightbearing as tolerated             -patient may shower with incisions covered             -ELOS/Goals: 7/9, supervision PT/OT- ? Home vs ALF  -Continue CIR Repeat xrays have good alignment of hardware, have notified Ortho PA 2.  Antithrombotics: -DVT/anticoagulation:  Mechanical: Antiembolism stockings, thigh (TED hose) Bilateral lower extremities.  Check vascular study             -antiplatelet therapy: Plavix  75 mg daily 3. Pain Management: tylenol  and Robaxin  as needed, discontinue IM Toradol  and begin tramadol  50mg  q6h PRN, may d/c robaxin  can use tramadol  more frequently but will reduce to 25mg  given daughter's concern about MS changes  Will try scheduling acetaminophen   but may require addition of tramadol   Other options include Butrans patch but will try above first  -6/30- pain if tramadol  not given in am, will schedule 25mg  q6 and monitor for sedation / confusion , if so switch to butrans  4. Mood/Behavior/Sleep: Provide emotional support -antipsychotic agents: Seroquel  25 mg nightly started inpatient d/t delirum which later resolved -11/06/23 poor sleep, declined seroquel -- will d/c , daughter wants to avoid sedating meds will start melatonin 3mg  nightly; monitor sleep 6/24 d/c Seroquel  5. Neuropsych/cognition: This patient is not capable of making decisions on his own behalf. 6. Skin/Wound Care: Routine skin checks Per [facility], sutures out yesterday  with f/u xrays, OV with Dr Celena 2 wk (or 1wk post hospital d/c) 7. Fluids/Electrolytes/Nutrition: Routine in and outs with follow-up chemistries, cont vitamins and supplements.     Latest Ref Rng & Units 11/07/2023    5:06 AM 11/04/2023    3:25 AM 11/03/2023    3:24 AM  BMP  Glucose 70 - 99 mg/dL 888  89  896   BUN 8 - 23 mg/dL 25  19  18     Creatinine 0.61 - 1.24 mg/dL 9.21  9.20  9.00   Sodium 135 - 145 mmol/L 136  137  137   Potassium 3.5 - 5.1 mmol/L 4.2  3.6  3.4   Chloride 98 - 111 mmol/L 105  104  104   CO2 22 - 32 mmol/L 25  26  25    Calcium  8.9 - 10.3 mg/dL 8.5  8.3  8.7     8.  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   Continue Plavix   9.  BPH.  Flomax  0.4 mg daily check PVRs  10.  Hypertension.  Monitor with increased mobility.  Benicar 5mg  daily currently on hold due to some soft blood pressures and resume as needed.   11/09/23 BPs mildly elevated , will see if this goes down with scheduled pain medication  -6/28-29/25 BPs improving, monitor Vitals:   11/11/23 1937 11/12/23 0340 11/12/23 1621 11/12/23 2007  BP: 136/73 137/63 116/74 123/67   11/13/23 0522 11/13/23 1145 11/13/23 1317 11/13/23 1932  BP: 135/73 (!) 156/81 131/72 133/70   11/14/23 0307 11/14/23 1318 11/14/23 2009 11/15/23 0355  BP: (!) 146/95 133/65 (!) 145/67 (!) 143/87   11.  Crestor  20 mg daily 12. Urinary incont resolved, no retention  13. Anemia: 11-12 range, monitor labs    Latest Ref Rng & Units 11/07/2023    5:06 AM 11/04/2023    3:25 AM 11/03/2023    3:24 AM  CBC  WBC 4.0 - 10.5 K/uL 9.1  10.6  13.0   Hemoglobin 13.0 - 17.0 g/dL 89.1  88.8  86.5   Hematocrit 39.0 - 52.0 % 33.0  33.9  40.5   Platelets 150 - 400 K/uL 318  242  271     14. H/o hereditary spherocytosis s/p splenectomy 15. COPD: continue nebs/bronchodilators PRN Off O2 sats ok 16. Vit D deficiency, level 8.51 on 6/18, started on 50kU weekly 17. Constipation:   -11/13/23 LBM yesterday, continue colace 100mg  BID Miralax  increased to BID Give sorbitol today 7/1 18. GI ppx: pepcid  20mg  daily  19.  Prior Left frontoparietal infarct no CIR stay   LOS: 10 days A FACE TO  FACE EVALUATION WAS PERFORMED  Samuel Mata 11/15/2023, 7:20 AM

## 2023-11-15 NOTE — Progress Notes (Addendum)
 Patient ID: Samuel Mata, male   DOB: 1930/06/25, 89 y.o.   MRN: [id number] Spoke with leslie-daughter to discuss options for Dad. She feels ALF does not provide adequate care at night for him and when he was there last time for 30 days respite she was there and caught him several times before he would fall. She is not able to stay with him this time and he is requiring more assist than supervision. His goals are CGA level. Discussed hiring assist and she reports both he and wife tend to dismiss them and feel not needed. Other option is SNF and made aware due to coming to rehab he may not be approved for this level of care. She wants to try so will do FL2 and begin working on this plan. Unsure who is in [employer] so will send out and see what offers are made. Daughter was in agreement with this and aware of the process if peer to peer and family appeal needs to be done. She wants to start early in preparation for dc 7/7.  2;59 PM Received message from leslie regarding possibility of going back to [facility] with hired assist at night for a short time to make sure does well with transition. Will discuss tomorrow after conference and best option for pt and family

## 2023-11-15 NOTE — Progress Notes (Signed)
 Occupational Therapy Session Note  Patient Details  Name: Samuel Mata MRN: [id number] Date of Birth: May 20, 1930  Today's Date: 11/15/2023 OT Individual Time: 1414-1530 OT Individual Time Calculation (min): 76 min    Short Term Goals: Week 2:  OT Short Term Goal 1 (Week 2): STG=LTG d/t ELOS  Skilled Therapeutic Interventions/Progress Updates:     Pt received deeply sleeping in recliner, waking upon OT arrival. Pt presenting to be in good spirits receptive to skilled OT session reporting 0/10 pain- OT offering intermittent rest breaks, repositioning, and therapeutic support to optimize participation in therapy session. Pt was noted to be in pain when WB'ing through L LE during functional mobility. Pt reported feeling very tired this PM, d/t a full day of therapy- requesting to take shower and get ready for bed this session. Focused this session on ADL retraining to increase [name] overall independence and decrease bourdon of care. Engaged Pt in completing functional mobility to bathroom using RW for endurance training with Pt able to complete ~10 ft using RW with CGA overall +increased time. Pt transferred to standard toilet using RW. 3/3 toileting tasks completed with min A overall for clothing management following BM & continent void. Short distance ambulatory transfer completed to TTB positioned in walk-in shower using RW and grab bars with min A required d/t fatigued, pain in L LE, and balance deficit. Majority of U/LB bathing completed in seated position for EC and to increase Pt safety. UB bathing completed with SUP and LB bathing completed with min A for balance when washing buttocks. Pt able to cross R LE into figure-four to wash R foot and utilize long handled sponge to wash L LE with min verbal cues required for technique. Following shower, Pt dried self in seated position with min A. Donned clean brief total A for energy conservation. Pt ambulated to wc in his room using RW with min A required  this trial d/t increased pain and fatigue. U/LB dressing completed seated in w/c. Donned OH shirt with SUP and pants with mod A to weave L LE and for balance when bringing pants to waist. Pt chose to sit at sink for grooming/hygiene tasks with Pt able to groom hair, brush teeth, and shave face with set-up A. Pt requested to return to bed at end of session. Stand pivot wc > EOB using RW CGA. EOB > supine min A to bring L LE into bed. Pt was left resting in bed with call bell in reach, bed alarm on, and all needs met.    Therapy Documentation Precautions:  Precautions Precautions: Fall Recall of Precautions/Restrictions: Intact Precaution/Restrictions Comments: pt able to tolerate activity on room air Restrictions Weight Bearing Restrictions Per Provider Order: Yes LLE Weight Bearing Per Provider Order: Weight bearing as tolerated   Therapy/Group: Individual Therapy  [name] 11/15/2023, 8:00 AM

## 2023-11-15 NOTE — NC FL2 (Signed)
 [place]  MEDICAID [id number] LEVEL OF CARE FORM     IDENTIFICATION  Patient Name: Samuel Mata Birthdate: 1930/10/19 Sex: male Admission Date (Current Location): 11/05/2023  [facility] and [place] Number:  Producer, television/film/video and Address:  [facility] [place], [address] [place], [place] [address]      Provider Number: [id number]  Attending Physician Name and Address:  Carilyn Prentice BRAVO, MD  Relative Name and Phone Number:  [name] [phone]  [facility] [phone]    Current Level of Care: Other (Comment) (rehab) Recommended Level of Care: Skilled Nursing Facility Prior Approval Number:    Date Approved/Denied:   PASRR Number: [id number]  Discharge Plan: SNF    Current Diagnoses: Patient Active Problem List   Diagnosis Date Noted   Coping style affecting medical condition 11/11/2023   Intertrochanteric fracture of left hip (HCC) 11/05/2023   Malnutrition of moderate degree 11/01/2023   Fall at home, initial encounter 10/31/2023   Hyponatremia 04/25/2023   Right thalamic stroke (HCC) 04/12/2023   Acute CVA (cerebrovascular accident) (HCC) 04/08/2023   Acute ischemic stroke (HCC)    Carotid dissection, bilateral (HCC)    Aphasia    Immunization due 05/20/2020   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   Medication monitoring encounter 03/07/2020   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   Bronchiectasis without complication (HCC) 11/22/2019   Hereditary spherocytosis (HCC) 08/29/2019   Seborrheic keratoses 08/29/2019   OSA (obstructive sleep apnea) 03/19/2019   History of TIA (transient ischemic attack) 01/23/2018   Hemoptysis 07/15/2017   Pulmonary infiltrates 07/15/2017   History of tobacco use 06/06/2017   Essential hypertension 09/22/2015   Gastroesophageal reflux disease without esophagitis 06/05/2014   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   Chronic obstructive pulmonary disease (HCC) 08/14/2013   Chronic rhinitis  08/14/2013   Hearing loss of both ears 08/14/2013    Orientation RESPIRATION BLADDER Height & Weight     Self, Situation, Place  Normal Continent Weight: 137 lb 9.1 oz (62.4 kg) Height:  5' 7 (170.2 cm)  BEHAVIORAL SYMPTOMS/MOOD NEUROLOGICAL BOWEL NUTRITION STATUS      Continent Diet (regular/thin liquids)  AMBULATORY STATUS COMMUNICATION OF NEEDS Skin   Limited Assist Verbally Normal                       Personal Care Assistance Level of Assistance  Bathing, Dressing Bathing Assistance: Limited assistance   Dressing Assistance: Limited assistance     Functional Limitations Info   (hx-CVA)     Speech Info: Adequate    SPECIAL CARE FACTORS FREQUENCY  PT (By licensed PT), OT (By licensed OT)     PT Frequency: 5x week OT Frequency: 5x week            Contractures Contractures Info: Not present    Additional Factors Info  Code Status, Allergies Code Status Info: Full Code Allergies Info: Codeine           Current Medications (11/15/2023):  This is the current hospital active medication list Current Facility-Administered Medications  Medication Dose Route Frequency Provider Last Rate Last Admin   acetaminophen  (TYLENOL ) tablet 650 mg  650 mg Oral TID AC & HS Kirsteins, Prentice BRAVO, MD   650 mg at 11/15/23 1142   clopidogrel  (PLAVIX ) tablet 75 mg  75 mg Oral Daily Angiulli, Daniel J, PA-C   75 mg at 11/15/23 9244   docusate sodium  (COLACE) capsule 100 mg  100 mg Oral BID AngiulliToribio [name], PA-C   100 mg  at 11/15/23 0755   famotidine  (PEPCID ) tablet 20 mg  20 mg Oral Daily Angiulli, Daniel J, PA-C   20 mg at 11/15/23 0755   feeding supplement (ENSURE PLUS HIGH PROTEIN) liquid 237 mL  237 mL Oral BID BM Angiulli, Daniel J, PA-C   237 mL at 11/15/23 1142   melatonin tablet 3 mg  3 mg Oral QHS Street, Letts, PA-C   3 mg at 11/14/23 2102   multivitamin liquid 15 mL  15 mL Oral Daily Carilyn Prentice BRAVO, MD   15 mL at 11/15/23 9244   ondansetron  (ZOFRAN ) tablet 4  mg  4 mg Oral Q6H PRN Angiulli, Daniel J, PA-C       Or   ondansetron  (ZOFRAN ) injection 4 mg  4 mg Intravenous Q6H PRN Angiulli, Daniel J, PA-C       polyethylene glycol (MIRALAX  / GLYCOLAX ) packet 17 g  17 g Oral BID Carilyn Prentice BRAVO, MD   17 g at 11/15/23 9244   rosuvastatin  (CRESTOR ) tablet 20 mg  20 mg Oral Daily Angiulli, Daniel J, PA-C   20 mg at 11/15/23 0755   simethicone  (MYLICON) chewable tablet 80 mg  80 mg Oral Q6H PRN Urbano Albright, MD       tamsulosin  (FLOMAX ) capsule 0.4 mg  0.4 mg Oral QPC supper Pegge Toribio [name], PA-C   0.4 mg at 11/14/23 1800   traMADol  (ULTRAM ) tablet 25 mg  25 mg Oral Q6H Kirsteins, Andrew E, MD   25 mg at 11/15/23 1142   Vitamin D  (Ergocalciferol ) (DRISDOL ) 1.25 MG (50000 UNIT) capsule 50,000 Units  50,000 Units Oral Q7 days Pegge Toribio [name], PA-C   50,000 Units at 11/11/23 8158     Discharge Medications: Please see discharge summary for a list of discharge medications.  Relevant Imaging Results:  Relevant Lab Results:   Additional Information ssn: [id number] Will need more rehab before going home from his femur fracture  [name], [name], LCSW

## 2023-11-15 NOTE — Progress Notes (Signed)
 Physical Therapy Session Note  Patient Details  Name: Samuel Mata MRN: [id number] Date of Birth: 1931-04-14  Today's Date: 11/15/2023 PT Individual Time: 1st Session [phone] 2nd Session [phone] PT Individual Time Calculation (min): 40 min + 45 min = 85 min  Short Term Goals: Week 1:  PT Short Term Goal 1 (Week 1): Pt will be able to perform supine to sit transfer with MI. PT Short Term Goal 2 (Week 1): Pt will be able to perform sit to supine transfer with MI. PT Short Term Goal 3 (Week 1): Pt will be able to perform STS transfer CGA. PT Short Term Goal 4 (Week 1): Pt will be able to ambulate x 60 ft with RW with PT CGA  Skilled Therapeutic Interventions/Progress Updates: Patient seated in recliner on entrance to room. Patient alert and agreeable to PT session. Pt reports that he is getting better everyday and harder by the yard and sinch by the inch.  Therapeutic Activity:  5STS- 13.86s, 16.22s, 18.03s - Pt performed this using both handles of RW to push from standing the entire test.   Transfers: Pt performed sit<>stand and stand to sit transfers throughout session with CGA using RW. Provided VC for pushing up from surface instead of grabbing the walker with both hands.  Gait Training:  Pt ambulated 143ft using RW with CGA. Pt demonstrated the following gait deviations with therapist providing the described cuing and facilitation for improvement: step to strategy and decreased velocity.   Pt was tired at the end of session and was wheeled back to the room dependent and transferred to recliner with RW with min A and vc cues to turn fully before reaching for chair to sit.   Patient left in recliner at end of session with brakes locked, chair alarm set, and all needs within reach.     2nd Session  Patient was tired from the last session so we decided to wheel him dependent to the gym. Pt sitting in recliner on entrance to room. Patient alert and agreeable to PT session.    Therapeutic Activity: Transfers: Pt performed sit<>stand and stand to sit transfers throughout session with CGA with RW. Provided VC for to fully turn around before reaching toward the recliner. Pt performed standing exercises in parallel bars with CGA to modA due to increased fatigue and pain. The pt L knee began to buckle after the 3rd set of standing exercises.  -Standing Knee Flexion 2x10  -Standing Hip Abduction- 2x10  Therapeutic Exercise: Pt performed the following exercises with therapist providing the described cuing and facilitation for improvement. -seated hip abduction - 3x10 -seated hip isometric ball squeeze -2x30s   Patient lying in recliner at end of session with brakes locked, chair alarm set, and all needs within reach.   Therapy Documentation Precautions:  Precautions Precautions: Fall Recall of Precautions/Restrictions: Intact Precaution/Restrictions Comments: pt able to tolerate activity on room air Restrictions Weight Bearing Restrictions Per Provider Order: Yes LLE Weight Bearing Per Provider Order: Weight bearing as tolerated     Therapy/Group: Individual Therapy  Kimora Stankovic 11/15/2023, 9:21 AM

## 2023-11-16 NOTE — NC FL2 (Signed)
 [place]  MEDICAID FL2 LEVEL OF CARE FORM     IDENTIFICATION  Patient Name: Samuel Mata Birthdate: Dec 13, 1930 Sex: male Admission Date (Current Location): 11/05/2023  [facility] and [place] Number:  Producer, television/film/video and Address:  The [place], [address] [place], [place] [address]      Provider Number: [id number]  Attending Physician Name and Address:  Carilyn Prentice BRAVO, MD  Relative Name and Phone Number:  Shai Mckenzie [phone]  [facility] [phone]    Current Level of Care: Other (Comment) (Rehab) Recommended Level of Care: Assisted Living Facility Prior Approval Number:    Date Approved/Denied:   PASRR Number: [id number]  Discharge Plan: Other (Comment) (ALF)    Current Diagnoses: Patient Active Problem List   Diagnosis Date Noted   Coping style affecting medical condition 11/11/2023   Intertrochanteric fracture of left hip (HCC) 11/05/2023   Malnutrition of moderate degree 11/01/2023   Fall at home, initial encounter 10/31/2023   Hyponatremia 04/25/2023   Right thalamic stroke (HCC) 04/12/2023   Acute CVA (cerebrovascular accident) (HCC) 04/08/2023   Acute ischemic stroke (HCC)    Carotid dissection, bilateral (HCC)    Aphasia    Immunization due 05/20/2020   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   Medication monitoring encounter 03/07/2020   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   Bronchiectasis without complication (HCC) 11/22/2019   Hereditary spherocytosis (HCC) 08/29/2019   Seborrheic keratoses 08/29/2019   OSA (obstructive sleep apnea) 03/19/2019   History of TIA (transient ischemic attack) 01/23/2018   Hemoptysis 07/15/2017   Pulmonary infiltrates 07/15/2017   History of tobacco use 06/06/2017   Essential hypertension 09/22/2015   Gastroesophageal reflux disease without esophagitis 06/05/2014   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   Chronic obstructive pulmonary disease (HCC) 08/14/2013   Chronic  rhinitis 08/14/2013   Hearing loss of both ears 08/14/2013    Orientation RESPIRATION BLADDER Height & Weight     Self, Situation, Place, Time  Normal Continent Weight: 137 lb 9.1 oz (62.4 kg) Height:  5' 7 (170.2 cm)  BEHAVIORAL SYMPTOMS/MOOD NEUROLOGICAL BOWEL NUTRITION STATUS      Continent Diet (regular/thin liquids)  AMBULATORY STATUS COMMUNICATION OF NEEDS Skin   Limited Assist Verbally Normal                       Personal Care Assistance Level of Assistance  Bathing, Dressing Bathing Assistance: Limited assistance   Dressing Assistance: Limited assistance     Functional Limitations Info   (hx-CVA)     Speech Info: Adequate    SPECIAL CARE FACTORS FREQUENCY  PT (By licensed PT), OT (By licensed OT)     PT Frequency: 5x week OT Frequency: 5x week            Contractures Contractures Info: Not present    Additional Factors Info  Code Status, Allergies Code Status Info: Full Code Allergies Info: Codeine           Current Medications (11/16/2023):  This is the current hospital active medication list Current Facility-Administered Medications  Medication Dose Route Frequency Provider Last Rate Last Admin   acetaminophen  (TYLENOL ) tablet 650 mg  650 mg Oral TID AC & HS Kirsteins, Prentice BRAVO, MD   650 mg at 11/16/23 0732   clopidogrel  (PLAVIX ) tablet 75 mg  75 mg Oral Daily Angiulli, Daniel J, PA-C   75 mg at 11/16/23 0732   docusate sodium  (COLACE) capsule 100 mg  100 mg Oral BID Angiulli, Daniel J, PA-C  100 mg at 11/16/23 9266   famotidine  (PEPCID ) tablet 20 mg  20 mg Oral Daily Angiulli, Daniel J, PA-C   20 mg at 11/16/23 0732   feeding supplement (ENSURE PLUS HIGH PROTEIN) liquid 237 mL  237 mL Oral BID BM Angiulli, Daniel J, PA-C   237 mL at 11/16/23 1144   melatonin tablet 3 mg  3 mg Oral QHS [address], [place], PA-C   3 mg at 11/15/23 2111   multivitamin liquid 15 mL  15 mL Oral Daily Carilyn Prentice BRAVO, MD   15 mL at 11/16/23 0732   ondansetron   (ZOFRAN ) tablet 4 mg  4 mg Oral Q6H PRN Angiulli, Daniel J, PA-C       Or   ondansetron  (ZOFRAN ) injection 4 mg  4 mg Intravenous Q6H PRN Angiulli, Daniel J, PA-C       polyethylene glycol (MIRALAX  / GLYCOLAX ) packet 17 g  17 g Oral BID Carilyn Prentice BRAVO, MD   17 g at 11/16/23 0732   rosuvastatin  (CRESTOR ) tablet 20 mg  20 mg Oral Daily Angiulli, Daniel J, PA-C   20 mg at 11/16/23 0732   simethicone  (MYLICON) chewable tablet 80 mg  80 mg Oral Q6H PRN Urbano Albright, MD       tamsulosin  (FLOMAX ) capsule 0.4 mg  0.4 mg Oral QPC supper Pegge Toribio [name], PA-C   0.4 mg at 11/15/23 1734   traMADol  (ULTRAM ) tablet 25 mg  25 mg Oral Q6H Kirsteins, Andrew E, MD   25 mg at 11/15/23 1734   Vitamin D  (Ergocalciferol ) (DRISDOL ) 1.25 MG (50000 UNIT) capsule 50,000 Units  50,000 Units Oral Q7 days Pegge Toribio [name], PA-C   50,000 Units at 11/11/23 8158     Discharge Medications: Please see discharge summary for a list of discharge medications.  Relevant Imaging Results:  Relevant Lab Results:   Additional Information ssn: [id number]  Raymonde [name], LCSW

## 2023-11-16 NOTE — Patient Care Conference (Signed)
 Inpatient RehabilitationTeam Conference and Plan of Care Update Date: 11/16/2023   Time: 10:51 AM    Patient Name: Samuel Mata      Medical Record Number: [id number]  Date of Birth: 1930/12/05 Sex: Male         Room/Bed: 4W16C/4W16C-01 Payor Info: Payor: [employer] MEDICARE / Plan: HUL[employer] MEDICARE HMO/PPO / Product Type: *No Product type* /    Admit Date/Time:  11/05/2023 11:48 AM  Primary Diagnosis:  Intertrochanteric fracture of left hip [facility])  Hospital Problems: Principal Problem:   Intertrochanteric fracture of left hip [facility]) Active Problems:   Coping style affecting medical condition    Expected Discharge Date: Expected Discharge Date: 11/21/23  Team Members Present: Physician leading conference: Dr. Prentice Compton Social Worker Present: Rhoda Clement, LCSW Nurse Present: Barnie Ronde, RN PT Present: Recardo Milliner, PT OT Present: Katheryn Mines, OT SLP Present: Recardo Mole, SLP     Current Status/Progress Goal Weekly Team Focus  Bowel/Bladder   Continent B/B  LBM 11/12/23   Will remain continent with B/B   Assist with toilet needs qshift/prn    Swallow/Nutrition/ Hydration               ADL's   CGA U/LB bathing, UB dressing SUP, LB dresing mod A, toileting CGA-MIN A, toilet/shower transfer CGA-MIN A // Barriers: Limited by pain in L LE, however improved from intial eval. He does fluctuate between CGA-min A for transfers depending on time of day, fatigue level, and pain   SUP overall   ADL retraining, dynamic balance, functional mobility training, Pt education, activity tolerance    Mobility   Bed mobility = CGA for LLE and positioning with pain, standing transfers = CGA with increased pain; Ambulation = up to 175 ft using RW with CGA and close w/c follow for pain. Low grade pain medication does help with pain.   overall CGA - may upgrade some goals to supervision  Barriers: pain in WB /// Work on: timing of pain medications with therapy sessions, improving LOA in  standing transfers, improving quality of gait, LLE strengthening, family ed    Communication                Safety/Cognition/ Behavioral Observations               Pain   Denies pain at this time   Will be free from pain   Assess pain qshift/prn    Skin   Surgical incision to left leg   Will be free from infection  Assess incision site for infection and promote healing      Discharge Planning:  Family discussing plan either ALF or SNF. Daughter's concerned needs someone at night due to risk og falling going to the bathroom. Wife could only do supervision level. Continue discussion after team conference what is best for all   Team Discussion: Patient post IT fracture of left hip with remote history of CVA. Functional level fluctuates due to fatigue and pain with left knee buckling.  Patient on target to meet rehab goals: yes, goals for ADLs upgraded to CGA and needs CGA for mobility.  Requires mod assist for lower body dressing and CGA for toileting. Goals for discharge set for CGA overall.  *See Care Plan and progress notes for long and short-term goals.   Revisions to Treatment Plan:  N/a   Teaching Needs: Safety, medications, transfers, toileting, etc.   Current Barriers to Discharge: Home enviroment access/layout and Lack of/limited family support  Possible Resolutions  to Barriers: Family education     Medical Summary Current Status: Pain control improved, still requiring supervision and min assist for mobility.  As a fall risk which is likely to be long-term.  Barriers to Discharge: Uncontrolled Pain   Possible Resolutions to [employer] Focus: Family working with social work on follow-up care, hired help versus SNF level   Continued Need for Acute Rehabilitation Level of Care: The patient requires daily medical management by a physician with specialized training in physical medicine and rehabilitation for the following reasons: Direction of a  multidisciplinary physical rehabilitation program to maximize functional independence : Yes Medical management of patient stability for increased activity during participation in an intensive rehabilitation regime.: Yes Analysis of laboratory values and/or radiology reports with any subsequent need for medication adjustment and/or medical intervention. : Yes   I attest that I was present, lead the team conference, and concur with the assessment and plan of the team.   Fredericka Sober B 11/16/2023, 3:39 PM

## 2023-11-16 NOTE — Progress Notes (Signed)
 Physical Therapy Session Note  Patient Details  Name: Samuel Mata MRN: [id number] Date of Birth: [birth year]  Today's Date: 11/16/2023 PT Individual Time: 0932-1045 PT Individual Time Calculation (min): 73 min   Short Term Goals: Week 2:  PT Short Term Goal 1 (Week 2): STG = LTG d/t ELOS PT Short Term Goal 2 (Week 2): Pt will be able to perform sit to supine transfer with MI. PT Short Term Goal 3 (Week 2): Pt will be able to perform STS transfer CGA. PT Short Term Goal 4 (Week 2): Pt will be able to ambulate x 60 ft with RW with PT CGA  Skilled Therapeutic Interventions/Progress Updates:    Pt presents in room seated in recliner, asleep, but awakens easily and agreeable to PT. Pt denies pain at this time. Session focused on NMR to promote weightshifting and dynamic standing balance with reaching outside BOS as well as therapeutic exercise to promote BLE/core strengthening to decrease pain and improve mobility. Pt completes sit<>stand/stand pivot transfers with CGA/close supervision with RW throughout session. Pt transported to day room dependently for time management and energy conservation. Pt completes stand step transfer to mat. Pt participates in standing NMR for challenging weightshifting, reaching outside BOS, and decreasing UE support for dynamic UE movements for dynamic standing balance with pt completing tossing bean bags at corn hole board with CGA, 3x3 min in standing with pt using RW for LUE support for first trial, no UE support for 2nd and 3rd trial with therapist providing verbal/tactile cues for upright posture with 3rd trial. Pt completes seated therapeutic exercise for BLE/core strengthening including: - seated marches 2x10 BLE (AAROM LLE with cues for upright posture) - seated LAQs 2x10 BLE - modified sit ups from reclined short sitting position x10 and 2x5 (2nd/3rd trial with weighted ball 4#) Pt then completes sit to stands x3 no UE support to promote immediate standing  balance and BLE strengthening. Pt completes stand step transfer with RW with min assist and cueing for RW management due to pt demonstrating antalgic gait with LLE stance phase. Pt transported back to room and returns to sitting in recliner with RW CGA where he remains with all needs within reach, call light in place, and chair alarm activated at end of session.  Therapy Documentation Precautions:  Precautions Precautions: Fall Recall of Precautions/Restrictions: Intact Precaution/Restrictions Comments: pt able to tolerate activity on room air Restrictions Weight Bearing Restrictions Per Provider Order: Yes LLE Weight Bearing Per Provider Order: Weight bearing as tolerated    Therapy/Group: Individual Therapy  Reche Ohara PT, DPT 11/16/2023, 10:45 AM

## 2023-11-16 NOTE — Plan of Care (Signed)
  Problem: Consults Goal: RH GENERAL PATIENT EDUCATION Description: See Patient Education module for education specifics. Outcome: Progressing   Problem: RH BOWEL ELIMINATION Goal: RH STG MANAGE BOWEL WITH ASSISTANCE Description: STG Manage Bowel with toileting Assistance. Outcome: Progressing   Problem: RH BLADDER ELIMINATION Goal: RH STG MANAGE BLADDER WITH MEDICATION WITH ASSISTANCE Description: STG Manage Bladder With Medication With mod I Assistance. Outcome: Progressing   Problem: RH PAIN MANAGEMENT Goal: RH STG PAIN MANAGED AT OR BELOW [name] PAIN GOAL Description: < 4 with PRNs Outcome: Progressing

## 2023-11-16 NOTE — Progress Notes (Signed)
 Patient ID: Edison Nicholson, male   DOB: 1931-03-14, 89 y.o.   MRN: [id number] Met with pt and daughter-Leslie to discuss team conference goals of CGA level and discharge date 7/7. Discussed options of ALF, home with hired 24/7 and SNF. Discussed he is too high level for SNF and pt has been to [facility] a few months back after CVA and had a good experience. Plan is to pursue [facility] with daughter Chartered loss adjuster at night to make sure safe. Have contact Den[facility] to being process she will sent paperwork packet and reach out to the family. Will try to work on for discharge 7/7, but may need to be a few days later, we'll see what can do.

## 2023-11-16 NOTE — Progress Notes (Signed)
 PROGRESS NOTE   Subjective/Complaints:  BM yesterday , continent  ROS: as per HPI. Denies CP, SOB, abd pain, N/V/D/C, or any other complaints at this time.    Objective:   DG HIP UNILAT WITH PELVIS 2-3 VIEWS LEFT Result Date: 11/14/2023 CLINICAL DATA:  Acute postoperative pain of left hip. EXAM: DG HIP (WITH OR WITHOUT PELVIS) 2-3V LEFT COMPARISON:  11/01/2023. FINDINGS: There is redemonstration of a comminuted intra cogent Tariq fracture of the left hip with placement of fixation hardware. Alignment appears stable. There is no dislocation bilaterally. Vascular calcifications are noted in the soft tissues. Soft tissue swelling is noted over the left lateral hip. IMPRESSION: Comminuted intertrochanteric fracture of the left hip with fixation hardware in place. No obvious postoperative abnormality is seen. Electronically Signed   By: Leita Birmingham M.D.   On: 11/14/2023 20:07    No results for input(s): WBC, HGB, HCT, PLT in the last 72 hours.  No results for input(s): NA, K, CL, CO2, GLUCOSE, BUN, CREATININE, CALCIUM  in the last 72 hours.   Urinalysis    Component Value Date/Time   COLORURINE YELLOW 11/06/2023 1648   APPEARANCEUR CLEAR 11/06/2023 1648   LABSPEC 1.019 11/06/2023 1648   PHURINE 6.0 11/06/2023 1648   GLUCOSEU NEGATIVE 11/06/2023 1648   HGBUR NEGATIVE 11/06/2023 1648   BILIRUBINUR NEGATIVE 11/06/2023 1648   BILIRUBINUR negative 12/11/2022 1225   KETONESUR NEGATIVE 11/06/2023 1648   PROTEINUR NEGATIVE 11/06/2023 1648   UROBILINOGEN 0.2 12/11/2022 1225   NITRITE NEGATIVE 11/06/2023 1648   LEUKOCYTESUR NEGATIVE 11/06/2023 1648      Intake/Output Summary (Last 24 hours) at 11/16/2023 0821 Last data filed at 11/16/2023 0434 Gross per 24 hour  Intake 240 ml  Output 125 ml  Net 115 ml        Physical Exam: Vital Signs Blood pressure (!) 146/74, pulse 74, temperature 97.9 F (36.6 C),  resp. rate 18, height 5' 7 (1.702 m), weight 62.4 kg, SpO2 95%.   General: No acute distress, sitting in w/c at sink Mood and affect are appropriate Heart: Regular rate and rhythm no rubs murmurs or extra sounds Lungs: Clear to auscultation, breathing unlabored, no rales or wheezes Abdomen: Positive bowel sounds, soft nontender to palpation, nondistended Extremities: No clubbing, cyanosis, or edema Skin: hip incision sites CDI   PRIOR EXAMS: Pain with AROM Hip flexion but not PROM Neurologic: Cranial nerves II through XII intact, motor strength is 5/5 in bilateral deltoid, bicep, tricep, grip, right hip flexor, knee extensors, ankle dorsiflexor and plantar flexor 3-/5 Left HF, KE, 5/5 ADF  Musculoskeletal: Full range of motion in all 4 extremities. No joint swelling     Assessment/Plan: 1. Functional deficits which require 3+ hours per day of interdisciplinary therapy in a comprehensive inpatient rehab setting. Physiatrist is providing close team supervision and 24 hour management of active medical problems listed below. Physiatrist and rehab team continue to assess barriers to discharge/monitor patient progress toward functional and medical goals  Care Tool:  Bathing    Body parts bathed by patient: Right arm, Left arm, Chest, Abdomen, Front perineal area, Buttocks, Right upper leg, Face, Left upper leg, Right lower leg, Left lower leg  Body parts bathed by helper: Buttocks     Bathing assist Assist Level: Contact Guard/Touching assist     Upper Body Dressing/Undressing Upper body dressing   What is the patient wearing?: Pull over shirt    Upper body assist Assist Level: Supervision/Verbal cueing    Lower Body Dressing/Undressing Lower body dressing      What is the patient wearing?: Pants     Lower body assist Assist for lower body dressing: Moderate Assistance - Patient 50 - 74%     Toileting Toileting    Toileting assist Assist for toileting: Minimal  Assistance - Patient > 75%     Transfers Chair/bed transfer  Transfers assist     Chair/bed transfer assist level: Contact Guard/Touching assist     Locomotion Ambulation   Ambulation assist      Assist level: Contact Guard/Touching assist Assistive device: Walker-rolling Max distance: 144ft   Walk 10 feet activity   Assist  Walk 10 feet activity did not occur: Safety/medical concerns  Assist level: Supervision/Verbal cueing Assistive device: Walker-rolling   Walk 50 feet activity   Assist Walk 50 feet with 2 turns activity did not occur: Safety/medical concerns  Assist level: Contact Guard/Touching assist Assistive device: Walker-rolling    Walk 150 feet activity   Assist Walk 150 feet activity did not occur: Safety/medical concerns  Assist level: Minimal Assistance - Patient > 75% Assistive device: Walker-rolling    Walk 10 feet on uneven surface  activity   Assist Walk 10 feet on uneven surfaces activity did not occur: Safety/medical concerns   Assist level: Minimal Assistance - Patient > 75% Assistive device: Development worker, international aid     Assist Is the patient using a wheelchair?: Yes Type of Wheelchair: Manual Wheelchair activity did not occur: Safety/medical concerns  Wheelchair assist level: Moderate Assistance - Patient 50 - 74%      Wheelchair 50 feet with 2 turns activity    Assist    Wheelchair 50 feet with 2 turns activity did not occur: Safety/medical concerns   Assist Level: Moderate Assistance - Patient 50 - 74%   Wheelchair 150 feet activity     Assist  Wheelchair 150 feet activity did not occur: Safety/medical concerns   Assist Level: Maximal Assistance - Patient 25 - 49%   Blood pressure (!) 146/74, pulse 74, temperature 97.9 F (36.6 C), resp. rate 18, height 5' 7 (1.702 m), weight 62.4 kg, SpO2 95%.  Medical Problem List and Plan: 1. Functional deficits secondary to left three-part  intertrochanteric hip fracture with subsequent IM nailing 11/01/2023.  Weightbearing as tolerated             -patient may shower with incisions covered             -ELOS/Goals: 7/9, supervision PT/OT- ? Home vs ALF  -Continue CIR Repeat xrays have good alignment of hardware, have notified Ortho PA 2.  Antithrombotics: -DVT/anticoagulation:  Mechanical: Antiembolism stockings, thigh (TED hose) Bilateral lower extremities.  Check vascular study             -antiplatelet therapy: Plavix  75 mg daily 3. Pain Management: tylenol  and Robaxin  as needed, discontinue IM Toradol  and begin tramadol  50mg  q6h PRN, may d/c robaxin  can use tramadol  more frequently but will reduce to 25mg  given daughter's concern about MS changes  Will try scheduling acetaminophen  but may require addition of tramadol   Other options include Butrans patch but will try above first  -6/30- pain if tramadol  not given in am, will  schedule 25mg  q6 and monitor for sedation / confusion , if so switch to butrans  4. Mood/Behavior/Sleep: Provide emotional support -antipsychotic agents: Seroquel  25 mg nightly started inpatient d/t delirum which later resolved -11/06/23 poor sleep, declined seroquel -- will d/c , daughter wants to avoid sedating meds will start melatonin 3mg  nightly; monitor sleep 6/24 d/c Seroquel  5. Neuropsych/cognition: This patient is not capable of making decisions on his own behalf. 6. Skin/Wound Care: Routine skin checks Per Ortho [facility] PA, sutures out yesterday  with f/u xrays, OV with Dr Celena 2 wk (or 1wk post hospital d/c) 7. Fluids/Electrolytes/Nutrition: Routine in and outs with follow-up chemistries, cont vitamins and supplements.     Latest Ref Rng & Units 11/07/2023    5:06 AM 11/04/2023    3:25 AM 11/03/2023    3:24 AM  BMP  Glucose 70 - 99 mg/dL 888  89  896   BUN 8 - 23 mg/dL 25  19  18    Creatinine 0.61 - 1.24 mg/dL 9.21  9.20  9.00   Sodium 135 - 145 mmol/L 136  137  137   Potassium 3.5 - 5.1  mmol/L 4.2  3.6  3.4   Chloride 98 - 111 mmol/L 105  104  104   CO2 22 - 32 mmol/L 25  26  25    Calcium  8.9 - 10.3 mg/dL 8.5  8.3  8.7     8.  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   Continue Plavix   9.  BPH.  Flomax  0.4 mg daily check PVRs  10.  Hypertension.  Monitor with increased mobility.  Benicar 5mg  daily currently on hold due to some soft blood pressures and resume as needed.   11/09/23 BPs mildly elevated , will see if this goes down with scheduled pain medication  -6/28-29/25 BPs improving, monitor Vitals:   11/12/23 2007 11/13/23 0522 11/13/23 1145 11/13/23 1317  BP: 123/67 135/73 (!) 156/81 131/72   11/13/23 1932 11/14/23 0307 11/14/23 1318 11/14/23 2009  BP: 133/70 (!) 146/95 133/65 (!) 145/67   11/15/23 0355 11/15/23 1321 11/15/23 1948 11/16/23 0438  BP: (!) 143/87 107/72 119/70 (!) 146/74   11.  Crestor  20 mg daily 12. Urinary incont resolved, no retention  13. Anemia: 11-12 range, monitor labs    Latest Ref Rng & Units 11/07/2023    5:06 AM 11/04/2023    3:25 AM 11/03/2023    3:24 AM  CBC  WBC 4.0 - 10.5 K/uL 9.1  10.6  13.0   Hemoglobin 13.0 - 17.0 g/dL 89.1  88.8  86.5   Hematocrit 39.0 - 52.0 % 33.0  33.9  40.5   Platelets 150 - 400 K/uL 318  242  271     14. H/o hereditary spherocytosis s/p splenectomy 15. COPD: continue nebs/bronchodilators PRN Off O2 sats ok 16. Vit D deficiency, level 8.51 on 6/18, started on 50kU weekly 17. Constipation:   Improved BM yesterday  18. GI ppx: pepcid  20mg  daily  19.  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

## 2023-11-16 NOTE — Progress Notes (Addendum)
 Occupational Therapy Session Note  Patient Details  Name: Samuel Mata MRN: [id number] Date of Birth: Jan 03, 1931  Today's Date: 11/16/2023 OT Individual Time: 1300-1335 and 1345-1415 OT Individual Time Calculation (min): 35 min + 30 = 65 min Missed Time: 10 min (d/t interruption of care)   Short Term Goals: Week 2:  OT Short Term Goal 1 (Week 2): STG=LTG d/t ELOS  Skilled Therapeutic Interventions/Progress Updates:     Pt received sitting up in recliner with DTR and SW present in room. Pt presenting to be in good spirits receptive to skilled OT session reporting 0/10 pain at rest and 5/10 pain during WB'ing- OT offering intermittent rest breaks, repositioning, and therapeutic support to optimize participation in therapy session. Focused this session at d/c planning, dynamic balance, ADL retraining, and UB strengthening to improve overall independence in ADLs.   D/c Planning:  -Spent time at beginning of session providing education on potential d/c plans. Provided education on his current functional status, need for 24/7 supervision, and impact of pain/fatigue on his functional status with Pt receptive to education. Pt initially insistent on returning home, however following in-depth discussion and education, Pt more receptive to idea of ALF to allow for 24/7 supervision and assistance.   ADL:  -Engaged Pt in completing grooming/hygiene tasks standing at sink with RW to work on activity tolerance and balance. Pt able to maintain balance with CGA while engaging B UEs in oral care, washing face, and grooming hair without LOB.   Therapeutic Activity:  -Pt completed dynamic standing balance card matching activity while standing at table top to work on standing tolerance, dual tasking, and dynamic balance for increased safety during ADLs. Pt able to tolerate standing for 2 bouts of 6 minutes with seated rest breaks provided between bouts. CGA required overall for standing balance when reaching  outside his BOS to retrieve cards.   Therapeutic Exercise:  -Pt completed the following therapeutic exercises in standing position using 4.4# weighted ball to work on dynamic balance and B UE strength for ADLs:   -Bicep curls, 2x10 reps  -Chest press, 2x10 reps  -Anterior shoulder flexion, 2x10 reps Pt required CGA-min A for balance overall during exercises with seated rest break provided between each exercise.   Pt fatigued at end of session requesting to return to bed. Stand pivot wc > EOB using RW CGA. EOB > supine CGA +verbal cues for technique. Pt was left resting in bed with call bell in reach, bed alarm on, and all needs met. Missed 10 minutes of skilled OT treatment d/t interruption in care during session. Will attempt to make up time as [name] status and schedule allow.   Therapy Documentation Precautions:  Precautions Precautions: Fall Recall of Precautions/Restrictions: Intact Precaution/Restrictions Comments: pt able to tolerate activity on room air Restrictions Weight Bearing Restrictions Per Provider Order: Yes LLE Weight Bearing Per Provider Order: Weight bearing as tolerated   Therapy/Group: Individual Therapy  [name] 11/16/2023, 8:02 AM

## 2023-11-17 NOTE — Progress Notes (Addendum)
 Physical Therapy Session Note  Patient Details  Name: Samuel Mata MRN: [id number] Date of Birth: June 12, 1930  Today's Date: 11/17/2023 PT Individual Time: 1020-1125 PT Individual Time Calculation (min): 65 min   Short Term Goals: Week 2:  PT Short Term Goal 1 (Week 2): STG = LTG d/t ELOS PT Short Term Goal 2 (Week 2): Pt will be able to perform sit to supine transfer with MI. PT Short Term Goal 3 (Week 2): Pt will be able to perform STS transfer CGA. PT Short Term Goal 4 (Week 2): Pt will be able to ambulate x 60 ft with RW with PT CGA  Skilled Therapeutic Interventions/Progress Updates: Patient sitting in WC on entrance to room. Patient alert and agreeable to PT session.   Patient reported mild pain in L LE (better when not moving) and stated having already received pain medication. Pt with melancholy presentation with no direct cause. Pt stated just feeling sorry for himself. Pt transported outside to [facility] to get fresh air, change of environment, and to provide active listening by the fountain and garden area. Pt and PTA discussed current presentation, and that it is okay to express sad emotions, even when there is no root cause. PTA and pt building pt rapport with pt seemingly perking up in demeanor. Pt thankful for taking the time to boost pt's morale this session, and was agreeable to ambulate on noncompliant surfaces with RW. Pt performed sit<>stand transfer in preparation for functional mobility with supervision (and stand pivot with RW at end of session from WC<EOB with supervision). Pt ambulated roughly 150' in RW with overall supervision/CGA for safety and with slightly more than step-to gait pattern with no reports of increase in L LE pain. Pt required increased time to navigate distance. Pt demonstrated safe navigation of slight incline/decline of sidewalk without incident. Pt transported back to room and requested to return back to bed due to fatigue. Pt daughter present in room on  arrival and updated on what was done during session. Pt performed sit<supine from EOB with supervision (use of HOB railing) and scoot to [facility] with assistance from [facility] railing and bridging with R LE. Pt thankful for time outside, and reported feeling better at the end.  Patient supine in bed at end of session with brakes locked, daughter present, bed alarm set, and all needs within reach.      Therapy Documentation Precautions:  Precautions Precautions: Fall Recall of Precautions/Restrictions: Intact Precaution/Restrictions Comments: pt able to tolerate activity on room air Restrictions Weight Bearing Restrictions Per Provider Order: Yes LLE Weight Bearing Per Provider Order: Weight bearing as tolerated   Therapy/Group: Individual Therapy  [name] PTA 11/17/2023, 12:39 PM

## 2023-11-17 NOTE — Progress Notes (Signed)
 Occupational Therapy Session Note  Patient Details  Name: Samuel Mata MRN: [id number] Date of Birth: 06-26-1930  Today's Date: 11/17/2023 OT Individual Time: 8693-8584 OT Individual Time Calculation (min): 69 min    Short Term Goals: Week 2:  OT Short Term Goal 1 (Week 2): STG=LTG d/t ELOS  Skilled Therapeutic Interventions/Progress Updates:     Pt received deeply sleeping in bed, waking upon OT arrival. Pt presenting to be tired, however in good spirits receptive to skilled OT session reporting 0/10 pain- OT offering intermittent rest breaks, repositioning, and therapeutic support to optimize participation in therapy session. He did demonstrate some avoidant behaviors and report soreness in L LE later during session. Increased time required to wake and initiate participation in session d/t just waking up. Focused this session on Pt education, ADL retraining, activity tolerance, and dynamic balance.   Self Care/ADL Retraining: -Spent time at beginning of session discussing d/t plans with Pt and providing therapeutic support as Pt expressed he feels a little sad that he will be going to ALF vs home at d/c. Following education on impact of his injury on his current functional status, Pt did demonstrate to reason for going to ALF to have 24/7 supervision and assistance, be provided with prepared meals, and be able to live with his wife.  -Grooming/hygiene: Pt requesting to complete grooming/hygiene tasks at beginning of session. He chose to sit at sink for these activities for energy conservation completing oral care, shaving face, and grooming his hair with set-up A.   Mobility:  -Bed Mobility: Pt completed bed mobility supine > EOB with use of bed rails  -Functional Mobility: Pt completed short distance functional mobility during session using RW with CGA using step-to pattern to decrease pain on L LE.   Therapeutic Activity:  -Engaged Pt in series of activities on BITs system to work on  reactionary balance, dynamic balance with reaching outside BOS, and dual tasking.  -Visual Scanning > Single Target/User Paced: Pt tasked with hitting moving target alternating reaching with R/LE UE while intermittently using RW for balance. Pt tolerated standing for 4 minutes during activity with SBA-CGA accuracy 95.89% and reaction time 1.71 sec. Pt with improved confidence in standing this session.  -Bell Cancellation: Pt tasked with maintaining dynamic standing balance using RW while visually scanning to locate bells and alternating reaching with R/LUE to touch bells. Pt able to tolerate 2 minutes of standing during activity with CGA and seated rest break following.   Transported Pt back to room total A in wc at end of session. Pt fatigued and requesting to sit in recliner. Stand pivot wc > recliner using RW CGA. Pt was left resting in recliner with call bell in reach, chair alarm on, and all needs met.    Therapy Documentation Precautions:  Precautions Precautions: Fall Recall of Precautions/Restrictions: Intact Precaution/Restrictions Comments: pt able to tolerate activity on room air Restrictions Weight Bearing Restrictions Per Provider Order: Yes LLE Weight Bearing Per Provider Order: Weight bearing as tolerated   Therapy/Group: Individual Therapy  [name] 11/17/2023, 8:00 AM

## 2023-11-17 NOTE — Progress Notes (Signed)
 Physical Therapy Session Note  Patient Details  Name: Samuel Mata MRN: [id number] Date of Birth: 1930-07-16  Today's Date: 11/17/2023 PT Individual Time:  -      Short Term Goals: Week 1:  PT Short Term Goal 1 (Week 1): Pt will be able to perform supine to sit transfer with MI. PT Short Term Goal 1 - Progress (Week 1): Progressing toward goal PT Short Term Goal 2 (Week 1): Pt will be able to perform sit to supine transfer with MI. PT Short Term Goal 2 - Progress (Week 1): Progressing toward goal PT Short Term Goal 3 (Week 1): Pt will be able to perform STS transfer CGA. PT Short Term Goal 3 - Progress (Week 1): Met PT Short Term Goal 4 (Week 1): Pt will be able to ambulate x 60 ft with RW with PT CGA PT Short Term Goal 4 - Progress (Week 1): Met Week 2:  PT Short Term Goal 1 (Week 2): STG = LTG d/t ELOS PT Short Term Goal 2 (Week 2): Pt will be able to perform sit to supine transfer with MI. PT Short Term Goal 3 (Week 2): Pt will be able to perform STS transfer CGA. PT Short Term Goal 4 (Week 2): Pt will be able to ambulate x 60 ft with RW with PT CGA  Skilled Therapeutic Interventions/Progress Updates:  Patient supine in bed on entrance to room. Patient alert and agreeable to PT session. Relates having eaten a few bites of oatmeal but not hungry this morning and does not care to finish breakfast. Removed from tray table.   Patient with no pain complaint at start of session. Relates medications provided by RN already this morning.   Therapeutic Activity: Bed Mobility: Pt performed supine > sit with close supervision. Demos pain with attempt to move LLE. No cues required for technique, just encouragement for completing to seated position on EOB. Relates desire to keep clothes on. Dons hearing aids with setup. Donned socks and shoes with MaxA.  Transfers: Pt performed sit<>stand from EOB to RW with CGA. Ambulatory transfer to toilet in order to change brief. No incontinence noted but  brief from previous evening. New brief donned MaxA but pt able to pull up pants with CGA for balance d/t pain in LLE. Ambulatory transfer to sink with CGA. Stands at sink to wash hands, brush teeth, and wash face all with supervision/ setup.  Gait Training:  Pt ambulated 100 ft using RW with CGA d/t pain in LLE. Demonstrated continued antalgic gait pattern d/t attempts to WB fully to LLE. Provided vc/ tc for maintaining LLE ahead of RLE similar to PWB gait pattern in order to better manage pain to LLE.   Guided pt in lateral stepping at hallway ahndrail over 10' in each direction in order to improve hip strength. Pt with difficulty d/t pain in L hip with open chain hip abduction to LLE as well as during WB to step RLE out to R side. Mild buckling from pain.   Patient seated upright in recliner at end of session with brakes locked, seat pad alarm set, and all needs within reach.  Therapy Documentation Precautions:  Precautions Precautions: Fall Recall of Precautions/Restrictions: Intact Precaution/Restrictions Comments: pt able to tolerate activity on room air Restrictions Weight Bearing Restrictions Per Provider Order: Yes LLE Weight Bearing Per Provider Order: Weight bearing as tolerated  Pain: Pain noted during session with WB to LLE. Pt premedicated and addressed during session with cues for [facility] and with repositioning.  Therapy/Group: Individual Therapy  [name] PT, DPT, CSRS 11/16/2023, 7:20 AM

## 2023-11-17 NOTE — Progress Notes (Signed)
 Physical Therapy Session Note  Patient Details  Name: Samuel Mata MRN: [id number] Date of Birth: 1930/09/02  Today's Date: 11/17/2023 PT Individual Time: 0920-1002 PT Individual Time Calculation: 42 min  Short Term Goals: Week 2:  PT Short Term Goal 1 (Week 2): STG = LTG d/t ELOS PT Short Term Goal 2 (Week 2): Pt will be able to perform sit to supine transfer with MI. PT Short Term Goal 3 (Week 2): Pt will be able to perform STS transfer CGA. PT Short Term Goal 4 (Week 2): Pt will be able to ambulate x 60 ft with RW with PT CGA  Skilled Therapeutic Interventions/Progress Updates:    Pt presents in room, supine in bed, asleep and slow to motivate OOB however pt agreeable to PT. Pt does not report pain at start of session but demonstrating pain avoidant behaviors and vocal outbursts with LLE mobility and weightbearing. Pt completes bed mobility with supervision using hospital bed rails, completes sit to stand from elevated bed height to RW. Pt ambulates with RW to bathroom with CGA with cues for sequencing stepping into RW and increasing BUE weightbearing with L stance phase to improve tolerance and decrease pain, pt negotiates threshold into bathroom well with CGA. Pt demonstrating decreased step length and decreased proximity to RW with fatigue. Pt able to position for toilet transfer with CGA, manages lower body dressing to knees, requests assist for managing pants below knees and requires min assist for sitting to toilet with pt keeping bilateral hands on RW. Pt continent of urine, charted. Therapist provides skilled cues for managing pants over knees prior to standing, therapist assist with donning brief, cues for positioning LLE and hand placement prior to stand to decrease pain and pt stands from toilet with min assist to RW. Pt able to manage pants in standing over hips. Pt then ambulates to sink to complete hand and oral hygiene in standing to progress standing tolerance and weightshifting  with pt standing for 8 minutes. Pt then sits to [facility] and becomes emotional, therapist uses therapeutic use of self with pt unable to describe what is making him emotional. Therapist uses empathetic listening and also reorients pt to goals and progress with pt verbalizing understanding. Pt returns to room and remains seated in [facility] with all needs within reach, cal light in place at end of session.   Therapy Documentation Precautions:  Precautions Precautions: Fall Recall of Precautions/Restrictions: Intact Precaution/Restrictions Comments: pt able to tolerate activity on room air Restrictions Weight Bearing Restrictions Per Provider Order: Yes LLE Weight Bearing Per Provider Order: Weight bearing as tolerated    Therapy/Group: Individual Therapy  Reche Ohara PT, DPT 11/17/2023, 9:25 AM

## 2023-11-17 NOTE — Progress Notes (Signed)
 PROGRESS NOTE   Subjective/Complaints:  Appreciate SW note  Continent BM this am , no pain at rest  Discussed care team conf with pt   ROS: as per HPI. Denies CP, SOB, abd pain, N/V/D/C, or any other complaints at this time.    Objective:   No results found.   No results for input(s): WBC, HGB, HCT, PLT in the last 72 hours.  No results for input(s): NA, K, CL, CO2, GLUCOSE, BUN, CREATININE, CALCIUM  in the last 72 hours.   Urinalysis    Component Value Date/Time   COLORURINE YELLOW 11/06/2023 1648   APPEARANCEUR CLEAR 11/06/2023 1648   LABSPEC 1.019 11/06/2023 1648   PHURINE 6.0 11/06/2023 1648   GLUCOSEU NEGATIVE 11/06/2023 1648   HGBUR NEGATIVE 11/06/2023 1648   BILIRUBINUR NEGATIVE 11/06/2023 1648   BILIRUBINUR negative 12/11/2022 1225   KETONESUR NEGATIVE 11/06/2023 1648   PROTEINUR NEGATIVE 11/06/2023 1648   UROBILINOGEN 0.2 12/11/2022 1225   NITRITE NEGATIVE 11/06/2023 1648   LEUKOCYTESUR NEGATIVE 11/06/2023 1648      Intake/Output Summary (Last 24 hours) at 11/17/2023 9177 Last data filed at 11/17/2023 0736 Gross per 24 hour  Intake 354 ml  Output 300 ml  Net 54 ml        Physical Exam: Vital Signs Blood pressure (!) 141/73, pulse 83, temperature 98 F (36.7 C), temperature source Oral, resp. rate 18, height 5' 7 (1.702 m), weight 62.4 kg, SpO2 92%.   General: No acute distress, sitting in w/c at sink Mood and affect are appropriate Heart: Regular rate and rhythm no rubs murmurs or extra sounds Lungs: Clear to auscultation, breathing unlabored, no rales or wheezes Abdomen: Positive bowel sounds, soft nontender to palpation, nondistended Extremities: No clubbing, cyanosis, or edema Skin: hip incision sites CDI   PRIOR EXAMS: Pain with AROM Hip flexion but not PROM Neurologic: Cranial nerves II through XII intact, motor strength is 5/5 in bilateral deltoid, bicep,  tricep, grip, right hip flexor, knee extensors, ankle dorsiflexor and plantar flexor 3-/5 Left HF, 3-/5 knee ext with pain inhibition 5/5 ADF  Musculoskeletal: Full range of motion in all 4 extremities. No joint swelling     Assessment/Plan: 1. Functional deficits which require 3+ hours per day of interdisciplinary therapy in a comprehensive inpatient rehab setting. Physiatrist is providing close team supervision and 24 hour management of active medical problems listed below. Physiatrist and rehab team continue to assess barriers to discharge/monitor patient progress toward functional and medical goals  Care Tool:  Bathing    Body parts bathed by patient: Right arm, Left arm, Chest, Abdomen, Front perineal area, Buttocks, Right upper leg, Face, Left upper leg, Right lower leg, Left lower leg   Body parts bathed by helper: Buttocks     Bathing assist Assist Level: Contact Guard/Touching assist     Upper Body Dressing/Undressing Upper body dressing   What is the patient wearing?: Pull over shirt    Upper body assist Assist Level: Supervision/Verbal cueing    Lower Body Dressing/Undressing Lower body dressing      What is the patient wearing?: Pants     Lower body assist Assist for lower body dressing: Moderate Assistance - Patient  50 - 74%     Toileting Toileting    Toileting assist Assist for toileting: Minimal Assistance - Patient > 75%     Transfers Chair/bed transfer  Transfers assist     Chair/bed transfer assist level: Contact Guard/Touching assist     Locomotion Ambulation   Ambulation assist      Assist level: Contact Guard/Touching assist Assistive device: Walker-rolling Max distance: 172ft   Walk 10 feet activity   Assist  Walk 10 feet activity did not occur: Safety/medical concerns  Assist level: Supervision/Verbal cueing Assistive device: Walker-rolling   Walk 50 feet activity   Assist Walk 50 feet with 2 turns activity did not  occur: Safety/medical concerns  Assist level: Contact Guard/Touching assist Assistive device: Walker-rolling    Walk 150 feet activity   Assist Walk 150 feet activity did not occur: Safety/medical concerns  Assist level: Minimal Assistance - Patient > 75% Assistive device: Walker-rolling    Walk 10 feet on uneven surface  activity   Assist Walk 10 feet on uneven surfaces activity did not occur: Safety/medical concerns   Assist level: Minimal Assistance - Patient > 75% Assistive device: Development worker, international aid     Assist Is the patient using a wheelchair?: Yes Type of Wheelchair: Manual Wheelchair activity did not occur: Safety/medical concerns  Wheelchair assist level: Moderate Assistance - Patient 50 - 74%      Wheelchair 50 feet with 2 turns activity    Assist    Wheelchair 50 feet with 2 turns activity did not occur: Safety/medical concerns   Assist Level: Moderate Assistance - Patient 50 - 74%   Wheelchair 150 feet activity     Assist  Wheelchair 150 feet activity did not occur: Safety/medical concerns   Assist Level: Maximal Assistance - Patient 25 - 49%   Blood pressure (!) 141/73, pulse 83, temperature 98 F (36.7 C), temperature source Oral, resp. rate 18, height 5' 7 (1.702 m), weight 62.4 kg, SpO2 92%.  Medical Problem List and Plan: 1. Functional deficits secondary to left three-part intertrochanteric hip fracture with subsequent IM nailing 11/01/2023.  Weightbearing as tolerated             -patient may shower with incisions covered             -ELOS/Goals: 7/9, supervision PT/OT- discussed ALF plan with pt which I would recommend and pt is agreeable   -Continue CIR Repeat xrays have good alignment of hardware, reviewed by Ortho PA as well  2.  Antithrombotics: -DVT/anticoagulation:  Mechanical: Antiembolism stockings, thigh (TED hose) Bilateral lower extremities.  Check vascular study             -antiplatelet therapy: Plavix  75  mg daily 3. Pain Management: tylenol  and Robaxin  as needed, discontinue IM Toradol  and begin tramadol  50mg  q6h PRN, may d/c robaxin  can use tramadol  more frequently but will reduce to 25mg  given daughter's concern about MS changes  Will try scheduling acetaminophen  but may require addition of tramadol   Other options include Butrans patch but will try above first  -6/30- pain if tramadol  not given in am, will schedule 25mg  q6 and monitor for sedation / confusion , if so switch to butrans  4. Mood/Behavior/Sleep: Provide emotional support -antipsychotic agents: Seroquel  25 mg nightly started inpatient d/t delirum which later resolved -11/06/23 poor sleep, declined seroquel -- will d/c , daughter wants to avoid sedating meds will start melatonin 3mg  nightly; monitor sleep 6/24 d/c Seroquel  5. Neuropsych/cognition: This patient is not capable of making  decisions on his own behalf. 6. Skin/Wound Care: Routine skin checks Per Ortho [facility] PA, sutures out y OV with Dr Celena 2 wk (or 1wk post hospital d/c) 7. Fluids/Electrolytes/Nutrition: Routine in and outs with follow-up chemistries, cont vitamins and supplements.     Latest Ref Rng & Units 11/07/2023    5:06 AM 11/04/2023    3:25 AM 11/03/2023    3:24 AM  BMP  Glucose 70 - 99 mg/dL 888  89  896   BUN 8 - 23 mg/dL 25  19  18    Creatinine 0.61 - 1.24 mg/dL 9.21  9.20  9.00   Sodium 135 - 145 mmol/L 136  137  137   Potassium 3.5 - 5.1 mmol/L 4.2  3.6  3.4   Chloride 98 - 111 mmol/L 105  104  104   CO2 22 - 32 mmol/L 25  26  25    Calcium  8.9 - 10.3 mg/dL 8.5  8.3  8.7     8.  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   Continue Plavix   9.  BPH.  Flomax  0.4 mg daily check PVRs  10.  Hypertension.  Monitor with increased mobility.  Benicar 5mg   daily currently on hold due to some soft blood pressures and resume as needed.   11/09/23 BPs mildly elevated , will see if this goes down with scheduled pain medication  -6/28-29/25 BPs improving, monitor Vitals:   11/13/23 1317 11/13/23 1932 11/14/23 0307 11/14/23 1318  BP: 131/72 133/70 (!) 146/95 133/65   11/14/23 2009 11/15/23 0355 11/15/23 1321 11/15/23 1948  BP: (!) 145/67 (!) 143/87 107/72 119/70   11/16/23 0438 11/16/23 1425 11/16/23 2031 11/17/23 0416  BP: (!) 146/74 (!) 120/59 124/65 (!) 141/73   11.  Crestor  20 mg daily 12. Urinary incont resolved, no retention  13. Anemia: 11-12 range, monitor labs    Latest Ref Rng & Units 11/07/2023    5:06 AM 11/04/2023    3:25 AM 11/03/2023    3:24 AM  CBC  WBC 4.0 - 10.5 K/uL 9.1  10.6  13.0   Hemoglobin 13.0 - 17.0 g/dL 89.1  88.8  86.5   Hematocrit 39.0 - 52.0 % 33.0  33.9  40.5   Platelets 150 - 400 K/uL 318  242  271     14. H/o hereditary spherocytosis s/p splenectomy 15. COPD: continue nebs/bronchodilators PRN Off O2 sats ok 16. Vit D deficiency, level 8.51 on 6/18, started on 50kU weekly 17. Constipation:   Improved BM this am , large type 5 18. GI ppx: pepcid  20mg  daily  19.  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

## 2023-11-17 NOTE — Plan of Care (Signed)
  Problem: RH BOWEL ELIMINATION Goal: RH STG MANAGE BOWEL WITH ASSISTANCE Description: STG Manage Bowel with toileting Assistance. Outcome: Progressing Goal: RH STG MANAGE BOWEL W/MEDICATION W/ASSISTANCE Description: STG Manage Bowel with Medication with mod I Assistance. Outcome: Progressing   Problem: RH BLADDER ELIMINATION Goal: RH STG MANAGE BLADDER WITH ASSISTANCE Description: STG Manage Bladder With toileting Assistance Outcome: Progressing   Problem: RH PAIN MANAGEMENT Goal: RH STG PAIN MANAGED AT OR BELOW [name] PAIN GOAL Description: < 4 with PRNs Outcome: Progressing

## 2023-11-17 NOTE — Progress Notes (Addendum)
 Patient ID: Samuel Mata, male   DOB: 03/28/1931, 89 y.o.   MRN: [id number]  Have faxed FL2 and diet order to [facility] and have attempted to call daughter-Leslie but her voice mail is full. Family may need to get a copy of pt's TB test done in Feb 2025 from PCP office. Will continue to try to reach daughter.   1;43 PM Spoke with camille-daughter how has returned form vacation and will reach out to [facility] to work out plan for admission. Will await to see if can transfer him on Monday.   2:58 PM Spoke with camille-daughter who reports [facility] RN to be here Monday to evaluate pt for admission and at the earliest can transfer there is Wednesday. Will need another TB test and will work on transfer. Made MD/PA and team aware of this

## 2023-11-18 MED ORDER — ADULT MULTIVITAMIN W/MINERALS CH
1.0000 | ORAL_TABLET | Freq: Every day | ORAL | Status: DC
  Administered 2023-11-18 – 2023-11-23 (×6): 1 via ORAL
  Filled 2023-11-18 (×6): qty 1

## 2023-11-18 NOTE — Progress Notes (Signed)
 Occupational Therapy Session Note  Patient Details  Name: Rafay Dahan MRN: [id number] Date of Birth: Sep 02, 1930  Today's Date: 11/18/2023 OT Individual Time: 1133-1204 OT Individual Time Calculation (min): 31 min    Short Term Goals: Week 2:  OT Short Term Goal 1 (Week 2): STG=LTG d/t ELOS  Skilled Therapeutic Interventions/Progress Updates:  Pt greeted supine in bed, pt agreeable to OT intervention.      Transfers/bed mobility/functional mobility:  Pt completed supine>sit MODI. Pt completed sit>stands with supervision and functional ambulation greater than a household distance with Rw and CGA with chair follow.    ADLs:  Grooming: pt stood for hand hygiene with supervision, min cues needed to locate soap on L side.  Transfers: ambulatory toilet transfer with Rw and CGA.  Toileting: continent b/b void, pt completed 3/3 toileting tasks with MINA.    Ended session with pt seated in w/c with all needs within reach and chair alarm activated.                    Therapy Documentation Precautions:  Precautions Precautions: Fall Recall of Precautions/Restrictions: Intact Precaution/Restrictions Comments: pt able to tolerate activity on room air Restrictions Weight Bearing Restrictions Per Provider Order: Yes LLE Weight Bearing Per Provider Order: Weight bearing as tolerated  Pain: No pain    Therapy/Group: Individual Therapy  Ronal Gift [facility] 11/18/2023, 12:24 PM

## 2023-11-18 NOTE — Progress Notes (Incomplete)
 Physical Therapy Session Note  Patient Details  Name: Samuel Mata MRN: [id number] Date of Birth: 24-Sep-1930  {CHL IP REHAB PT TIME CALCULATION:304800500}  Short Term Goals: {DUH:6958314}  Skilled Therapeutic Interventions/Progress Updates:      Therapy Documentation Precautions:  Precautions Precautions: Fall Recall of Precautions/Restrictions: Intact Precaution/Restrictions Comments: pt able to tolerate activity on room air Restrictions Weight Bearing Restrictions Per Provider Order: Yes LLE Weight Bearing Per Provider Order: Weight bearing as tolerated General:   Vital Signs:   Pain: Pain Assessment Pain Scale: 0-10 Pain Score: 2  Pain Location: Leg Pain Intervention(s): Medication (See eMAR) Mobility:   Locomotion :    Trunk/Postural Assessment :    Balance:   Exercises:   Other Treatments:      Therapy/Group: {Therapy/Group:[id number]}  [name] 11/18/2023, 7:52 AM

## 2023-11-18 NOTE — Progress Notes (Signed)
 Occupational Therapy Session Note  Patient Details  Name: Samuel Mata MRN: [id number] Date of Birth: 03-23-1931  Today's Date: 11/18/2023 OT Individual Time: 1435-1450 OT Individual Time Calculation (min): 15 min  and Today's Date: 11/18/2023 OT Missed Time: 15 Minutes Missed Time Reason: Patient fatigue (fatigue)   Short Term Goals: Week 2:  OT Short Term Goal 1 (Week 2): STG=LTG d/t ELOS  Skilled Therapeutic Interventions/Progress Updates:     Pt received sitting up in wc, dressed for the day with all ADL needs met upon OT arrival. Pt presenting to be fatigued and down in spirits reporting 0/10 pain- OT offering intermittent rest breaks, repositioning, and therapeutic support to optimize participation in therapy session. Pt expressing to this OT that he thought he was going to die today d/t not feeling well that past few days. Inquired about what wasn't feeling well and offered to contact his RN for medications if need be, however Pt declining reporting that he is not in any pain and that he can't really describe why he feels bad. Provided therapeutic support and engaged Pt in light hearted conversation. Through problem solving based discussion, determined that Pt is feeling down d/t the possibility of not being able to return home at d/c and he is struggling with giving up his independence and adjusting to a new normal. Provided reassurance that these feelings are normal and that we can continue the discussion of d/c location to determine the best, safest option. Pt reporting fatigue and requesting to return to bed for a nap this PM. He completed stand pivot using RW with CGA. EOB > supine SUP with HOB elevated. Pt was left resting in bed with call bell in reach, bed alarm on, and all needs met.  Missed 15 minutes of skilled OT treatment. Will attempt to make up time as pt's status and schedule allow.   Therapy Documentation Precautions:  Precautions Precautions: Fall Recall of  Precautions/Restrictions: Intact Precaution/Restrictions Comments: pt able to tolerate activity on room air Restrictions Weight Bearing Restrictions Per Provider Order: Yes LLE Weight Bearing Per Provider Order: Weight bearing as tolerated   Therapy/Group: Individual Therapy  [name] 11/18/2023, 2:57 PM

## 2023-11-18 NOTE — Plan of Care (Signed)
  Problem: Consults Goal: RH GENERAL PATIENT EDUCATION Description: See Patient Education module for education specifics. Outcome: Progressing   Problem: RH BOWEL ELIMINATION Goal: RH STG MANAGE BOWEL WITH ASSISTANCE Description: STG Manage Bowel with toileting Assistance. Outcome: Progressing   Problem: RH BLADDER ELIMINATION Goal: RH STG MANAGE BLADDER WITH ASSISTANCE Description: STG Manage Bladder With toileting Assistance Outcome: Progressing   Problem: RH PAIN MANAGEMENT Goal: RH STG PAIN MANAGED AT OR BELOW [name] PAIN GOAL Description: < 4 with PRNs Outcome: Progressing

## 2023-11-18 NOTE — Progress Notes (Signed)
 PROGRESS NOTE   Subjective/Complaints:  Appreciate SW note  In good spirits, ate all of breakfast   ROS: as per HPI. Denies CP, SOB, abd pain, N/V/D/C, or any other complaints at this time.    Objective:   No results found.   No results for input(s): WBC, HGB, HCT, PLT in the last 72 hours.  No results for input(s): NA, K, CL, CO2, GLUCOSE, BUN, CREATININE, CALCIUM  in the last 72 hours.   Urinalysis    Component Value Date/Time   COLORURINE YELLOW 11/06/2023 1648   APPEARANCEUR CLEAR 11/06/2023 1648   LABSPEC 1.019 11/06/2023 1648   PHURINE 6.0 11/06/2023 1648   GLUCOSEU NEGATIVE 11/06/2023 1648   HGBUR NEGATIVE 11/06/2023 1648   BILIRUBINUR NEGATIVE 11/06/2023 1648   BILIRUBINUR negative 12/11/2022 1225   KETONESUR NEGATIVE 11/06/2023 1648   PROTEINUR NEGATIVE 11/06/2023 1648   UROBILINOGEN 0.2 12/11/2022 1225   NITRITE NEGATIVE 11/06/2023 1648   LEUKOCYTESUR NEGATIVE 11/06/2023 1648      Intake/Output Summary (Last 24 hours) at 11/18/2023 0737 Last data filed at 11/18/2023 0700 Gross per 24 hour  Intake 240 ml  Output 1575 ml  Net -1335 ml        Physical Exam: Vital Signs Blood pressure 137/73, pulse 78, temperature 97.8 F (36.6 C), temperature source Oral, resp. rate 17, height 5' 7 (1.702 m), weight 62.4 kg, SpO2 94%.   General: No acute distress, sitting in w/c at sink Mood and affect are appropriate Heart: Regular rate and rhythm no rubs murmurs or extra sounds Lungs: Clear to auscultation, breathing unlabored, no rales or wheezes Abdomen: Positive bowel sounds, soft nontender to palpation, nondistended Extremities: No clubbing, cyanosis, or edema Skin: hip incision sites CDI   Pain with AROM Hip flexion but not PROM Neurologic: Cranial nerves II through XII intact, motor strength is 5/5 in bilateral deltoid, bicep, tricep, grip, right hip flexor, knee extensors, ankle  dorsiflexor and plantar flexor 3-/5 Left HF, 3-/5 knee ext with pain inhibition 5/5 ADF  Musculoskeletal: Full range of motion in all 4 extremities. No joint swelling     Assessment/Plan: 1. Functional deficits which require 3+ hours per day of interdisciplinary therapy in a comprehensive inpatient rehab setting. Physiatrist is providing close team supervision and 24 hour management of active medical problems listed below. Physiatrist and rehab team continue to assess barriers to discharge/monitor patient progress toward functional and medical goals  Care Tool:  Bathing    Body parts bathed by patient: Right arm, Left arm, Chest, Abdomen, Front perineal area, Buttocks, Right upper leg, Face, Left upper leg, Right lower leg, Left lower leg   Body parts bathed by helper: Buttocks     Bathing assist Assist Level: Contact Guard/Touching assist     Upper Body Dressing/Undressing Upper body dressing   What is the patient wearing?: Pull over shirt    Upper body assist Assist Level: Supervision/Verbal cueing    Lower Body Dressing/Undressing Lower body dressing      What is the patient wearing?: Pants     Lower body assist Assist for lower body dressing: Moderate Assistance - Patient 50 - 74%     Toileting Toileting  Toileting assist Assist for toileting: Minimal Assistance - Patient > 75%     Transfers Chair/bed transfer  Transfers assist     Chair/bed transfer assist level: Contact Guard/Touching assist     Locomotion Ambulation   Ambulation assist      Assist level: Contact Guard/Touching assist Assistive device: Walker-rolling Max distance: 120ft   Walk 10 feet activity   Assist  Walk 10 feet activity did not occur: Safety/medical concerns  Assist level: Supervision/Verbal cueing Assistive device: Walker-rolling   Walk 50 feet activity   Assist Walk 50 feet with 2 turns activity did not occur: Safety/medical concerns  Assist level:  Contact Guard/Touching assist Assistive device: Walker-rolling    Walk 150 feet activity   Assist Walk 150 feet activity did not occur: Safety/medical concerns  Assist level: Minimal Assistance - Patient > 75% Assistive device: Walker-rolling    Walk 10 feet on uneven surface  activity   Assist Walk 10 feet on uneven surfaces activity did not occur: Safety/medical concerns   Assist level: Minimal Assistance - Patient > 75% Assistive device: Development worker, international aid     Assist Is the patient using a wheelchair?: Yes Type of Wheelchair: Manual Wheelchair activity did not occur: Safety/medical concerns  Wheelchair assist level: Moderate Assistance - Patient 50 - 74%      Wheelchair 50 feet with 2 turns activity    Assist    Wheelchair 50 feet with 2 turns activity did not occur: Safety/medical concerns   Assist Level: Moderate Assistance - Patient 50 - 74%   Wheelchair 150 feet activity     Assist  Wheelchair 150 feet activity did not occur: Safety/medical concerns   Assist Level: Maximal Assistance - Patient 25 - 49%   Blood pressure 137/73, pulse 78, temperature 97.8 F (36.6 C), temperature source Oral, resp. rate 17, height 5' 7 (1.702 m), weight 62.4 kg, SpO2 94%.  Medical Problem List and Plan: 1. Functional deficits secondary to left three-part intertrochanteric hip fracture with subsequent IM nailing 11/01/2023.  Weightbearing as tolerated             -patient may shower with incisions covered             -ELOS/Goals: 7/9, supervision PT/OT- discussed ALF plan with pt which I would recommend and pt is agreeable   -Continue CIR Repeat xrays have good alignment of hardware, reviewed by Ortho PA as well  2.  Antithrombotics: -DVT/anticoagulation:  Mechanical: Antiembolism stockings, thigh (TED hose) Bilateral lower extremities.     Amb 150' with PT yesterday Sup/CGA          -antiplatelet therapy: Plavix  75 mg daily 3. Pain Management:  tylenol  and Robaxin  as needed, discontinue IM Toradol  and begin tramadol  50mg  q6h PRN, may d/c robaxin  can use tramadol  more frequently but will reduce to 25mg  given daughter's concern about MS changes  Will try scheduling acetaminophen  but may require addition of tramadol   Other options include Butrans patch but will try above first  -6/30- pain if tramadol  not given in am, will schedule 25mg  q6 and monitor for sedation / confusion , if so switch to butrans  4. Mood/Behavior/Sleep: Provide emotional support -antipsychotic agents: Seroquel  25 mg nightly started inpatient d/t delirum which later resolved -11/06/23 poor sleep, declined seroquel -- will d/c , daughter wants to avoid sedating meds will start melatonin 3mg  nightly; monitor sleep 6/24 d/c Seroquel  5. Neuropsych/cognition: This patient is not capable of making decisions on his own behalf. 6. Skin/Wound Care: Routine skin  checks Per Ortho [facility] PA, sutures out y OV with Dr Celena 2 wk (or 1wk post hospital d/c) 7. Fluids/Electrolytes/Nutrition: Routine in and outs with follow-up chemistries, cont vitamins and supplements.     Latest Ref Rng & Units 11/07/2023    5:06 AM 11/04/2023    3:25 AM 11/03/2023    3:24 AM  BMP  Glucose 70 - 99 mg/dL 888  89  896   BUN 8 - 23 mg/dL 25  19  18    Creatinine 0.61 - 1.24 mg/dL 9.21  9.20  9.00   Sodium 135 - 145 mmol/L 136  137  137   Potassium 3.5 - 5.1 mmol/L 4.2  3.6  3.4   Chloride 98 - 111 mmol/L 105  104  104   CO2 22 - 32 mmol/L 25  26  25    Calcium  8.9 - 10.3 mg/dL 8.5  8.3  8.7     8.  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   Continue Plavix   9.  BPH.  Flomax  0.4 mg daily check PVRs  10.  Hypertension.  Monitor with increased mobility.  Benicar 5mg  daily currently on hold due to  some soft blood pressures and resume as needed.   11/09/23 BPs mildly elevated , will see if this goes down with scheduled pain medication  -6/28-29/25 BPs improving, monitor Vitals:   11/14/23 1318 11/14/23 2009 11/15/23 0355 11/15/23 1321  BP: 133/65 (!) 145/67 (!) 143/87 107/72   11/15/23 1948 11/16/23 0438 11/16/23 1425 11/16/23 2031  BP: 119/70 (!) 146/74 (!) 120/59 124/65   11/17/23 0416 11/17/23 1525 11/17/23 2020 11/18/23 0334  BP: (!) 141/73 136/67 139/73 137/73   11.  Crestor  20 mg daily 12. Urinary incont resolved, no retention  13. Anemia: 11-12 range, monitor labs    Latest Ref Rng & Units 11/07/2023    5:06 AM 11/04/2023    3:25 AM 11/03/2023    3:24 AM  CBC  WBC 4.0 - 10.5 K/uL 9.1  10.6  13.0   Hemoglobin 13.0 - 17.0 g/dL 89.1  88.8  86.5   Hematocrit 39.0 - 52.0 % 33.0  33.9  40.5   Platelets 150 - 400 K/uL 318  242  271     14. H/o hereditary spherocytosis s/p splenectomy 15. COPD: continue nebs/bronchodilators PRN Off O2 sats ok 16. Vit D deficiency, level 8.51 on 6/18, started on 50kU weekly 17. Constipation:   Improved BM 7/3 , large type 5 18. GI ppx: pepcid  20mg  daily  19.  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

## 2023-11-18 NOTE — Progress Notes (Signed)
 Physical Therapy Session Note  Patient Details  Name: Samuel Mata MRN: [id number] Date of Birth: [birth year]  Today's Date: 11/18/2023 PT Individual Time: 0918-1028 PT Individual Time Calculation (min): 70 min   Short Term Goals: Week 2:  PT Short Term Goal 1 (Week 2): STG = LTG d/t ELOS PT Short Term Goal 2 (Week 2): Pt will be able to perform sit to supine transfer with MI. PT Short Term Goal 3 (Week 2): Pt will be able to perform STS transfer CGA. PT Short Term Goal 4 (Week 2): Pt will be able to ambulate x 60 ft with RW with PT CGA  Skilled Therapeutic Interventions/Progress Updates: Patient supine in bed on entrance to room. Patient alert and agreeable to PT session.   Patient reported unrated, tolerable pain in L LE. Pt reported 3/10 pain on L LE (scheduled pain meds) at end of session.  Therapeutic Activity: Bed Mobility: Pt performed supine<sit EOB with supervision (HOB elevated), and light minA sit<supine end of session due to transferring into bed on R side vs L, which placed L LE leading transfer to supine. Transfers: Pt performed sit<>stand transfers throughout session with overall light CGA (light minA on first STS transfer from EOB<WC with pt presenting with posterior weight shift onto heels). Provided VC for pt to scoot anteriorly, and to press toes into floor to promote forward weight shift with pt improving technique throughout session. Pt participated in 3 sit<>stands end of session working on sequence of anterior lean, pushing off of WC and pressing toes into ground, but pt required increased cuing to recall sequence due to fatigue (pt transferred back to bed end of session to rest prior to next therapy session).   Pt ambulated on noncompliant surface (slight incline on sidewalk) with close supervision (roughly 40'), slightly more than step to pattern and decreased cadence/clearance/step length B LE. Pt required seated rest break due to reports L pain. Pt transported back  to room in [facility] in order to adjust brief that started to fall under personal pants. PTA assisted pt brief adjustment while standing in RW.  Therapeutic Exercise: - Pt tossing horseshoes to peg target outside of [facility] center. Pt participated in 4 rounds (against PTA and pt daughter) with 1st 2 rounds pt having L UE supported on RW. Pt progressed to no UE support on last 2 rounds tossing with R UE. Pt overall performed dynamic standing balance with tossing horseshoe with close supervision. Pt provided with seated rest breaks between rounds. Pt performed in order to increase WB tolerance to L LE and dynamic standing balance  Patient supine in bed at end of session with brakes locked, daughter present, bed alarm set, and all needs within reach.      Therapy Documentation Precautions:  Precautions Precautions: Fall Recall of Precautions/Restrictions: Intact Precaution/Restrictions Comments: pt able to tolerate activity on room air Restrictions Weight Bearing Restrictions Per Provider Order: Yes LLE Weight Bearing Per Provider Order: Weight bearing as tolerated  Therapy/Group: Individual Therapy  Chinmay Squier PTA 11/18/2023, 3:00 PM

## 2023-11-18 NOTE — Progress Notes (Signed)
 Patient ID: Samuel Mata, male   DOB: 1930-06-25, 89 y.o.   MRN: [id number] Met with pt and daughter-Camille who has concerns regarding going to ALF and feeling dad is SNF. Discussed he has managed care medicare and can try to get coverage but he may be a too high level and not even considered Skilled. May be considered custodial care and just need someone beside when ambulation. Will pursue SNF and still have [facility] come to evaluate on Monday. Another option is going home with 24/7 hired care. Will sent out FL2 and work on all avenues. Both aware pt is high risk to fall wherever he is and can fall here also. Also discussed since came to rehab insurance looks at this also. Daughter to go to PT session with pt.

## 2023-11-19 NOTE — Progress Notes (Signed)
 Occupational Therapy Session Note  Patient Details  Name: Samuel Mata MRN: [id number] Date of Birth: 1931/04/30  Today's Date: 11/19/2023 OT Individual Time: 1115-1200 OT Individual Time Calculation (min): 45 min    Short Term Goals: Week 1:  OT Short Term Goal 1 (Week 1): Pt will complete toilet transfer with Min A consistently with LRAD OT Short Term Goal 1 - Progress (Week 1): Met OT Short Term Goal 2 (Week 1): Pt will complete grooming in standing at Min A with LRAD OT Short Term Goal 2 - Progress (Week 1): Met OT Short Term Goal 3 (Week 1): Pt will complete task in standing at Min A with LRAD for at least 2 min OT Short Term Goal 3 - Progress (Week 1): Met  Skilled Therapeutic Interventions/Progress Updates:    Patietn seated in the recliner at the time of arrival napping. The pt reported being able to sleep through the night with no pain to report. The pt was able to complete sit to stand using the RW with CGA and additional time , 3x with 1 rest break  The pt able to simulate UB dressing with initial demonstration and vc's.  The pt was able to complete simulated task in LB dressing with ModA after demonstration and vc's.  The pt went on to complete a standing task by ambulating around the room using the RW to retrieve pieces of paper dispersed around the floor incorporating long handle reacher .   The pt was able to complete the task with CGA using the RW with initial demonstration and vc's .  The pt was able to return to the recliner with CGA using the RW and the arm of the recliner. The pt went on to complete UB exercises using theraband for various planes 2 set of 10 with rest breaks as needed. The pt was instructed in relaxation breathing to improve compliance, the pt was able to demonstrate effective carryover following demonstration .  At the end of the session, the pt remained seated in the recliner, with the call light and bedside table  within reach and all additional needs were  addressed prior to exiting the room.    Therapy Documentation Precautions:  Precautions Precautions: Fall Recall of Precautions/Restrictions: Intact Precaution/Restrictions Comments: pt able to tolerate activity on room air Restrictions Weight Bearing Restrictions Per Provider Order: Yes LLE Weight Bearing Per Provider Order: Weight bearing as tolerated  Therapy/Group: Individual Therapy  [name] 11/19/2023, 3:57 PM

## 2023-11-19 NOTE — Progress Notes (Signed)
+/-   sleep. Voids small frequent amounts in urinal, with occasional spills. Pain managed with scheduled tylenol  and ultram . Samuel Mata A

## 2023-11-19 NOTE — Progress Notes (Signed)
 Physical Therapy Session Note  Patient Details  Name: Samuel Mata MRN: [id number] Date of Birth: 06/17/1930  Today's Date: 11/19/2023 PT Individual Time: 0803-0927 PT Individual Time Calculation (min): 84 min   Short Term Goals: Week 1:  PT Short Term Goal 1 (Week 1): Pt will be able to perform supine to sit transfer with MI. PT Short Term Goal 1 - Progress (Week 1): Progressing toward goal PT Short Term Goal 2 (Week 1): Pt will be able to perform sit to supine transfer with MI. PT Short Term Goal 2 - Progress (Week 1): Progressing toward goal PT Short Term Goal 3 (Week 1): Pt will be able to perform STS transfer CGA. PT Short Term Goal 3 - Progress (Week 1): Met PT Short Term Goal 4 (Week 1): Pt will be able to ambulate x 60 ft with RW with PT CGA PT Short Term Goal 4 - Progress (Week 1): Met Week 2:  PT Short Term Goal 1 (Week 2): STG = LTG d/t ELOS PT Short Term Goal 2 (Week 2): Pt will be able to perform sit to supine transfer with MI. PT Short Term Goal 3 (Week 2): Pt will be able to perform STS transfer CGA. PT Short Term Goal 4 (Week 2): Pt will be able to ambulate x 60 ft with RW with PT CGA  Skilled Therapeutic Interventions/Progress Updates:  Patient supine in bed on entrance to room. Patient alert and agreeable to PT session. Dtr, Camile, present.   Patient with no pain complaint at start of session.  Therapeutic Activity: Bed Mobility: Pt performed supine > sit requiring extra time d/t pain but no physical assist. No vc for technique. Suggested changing clothes in bathroom. Transfers: Pt performed sit<>stand and stand pivot transfers throughout session with supervision/ CGA and demonstrates pain with use and WB to LLE. Provided vc for LLE positioning for increased pain mgmt. Ambulatory transfer to bathroom where pt sits to shower bench with CGA. maxA for clothing mgmt and donning new pullup brief. Ambulates to sink with close supervision and is able to stand at sink for  morning ADLs including brushing teeth, shaving, washing face, washing hands, all with supervision. Then sits to w/c to don hearing aids with setup.   Gait Training:  Pt ambulated >100' x2 using RW with antalgic gait pattern and CGA/ supervision. Provided vc/ tc for attempt to maintain PWB for pain mgmt.   On reaching day room, pt guided in matching task for playing cards to back of mirror. Instructed to choose one card at a time from pt's R side, passing to L hand and matching to target board on back of mirror placed to anterior edge of BOS in order to require step out with LLE.  Pt requires moderate assistance to correctly identify cards on mirror despite correctly naming card in hand. Completes with CGA.   Patient seated upright in recliner at end of session with brakes locked, seat pad alarm set, and all needs within reach.   Therapy Documentation Precautions:  Precautions Precautions: Fall Recall of Precautions/Restrictions: Intact Precaution/Restrictions Comments: pt able to tolerate activity on room air Restrictions Weight Bearing Restrictions Per Provider Order: Yes LLE Weight Bearing Per Provider Order: Weight bearing as tolerated  Pain: Moderate pain demonstrated with any weight bearing to LLE during session. Addressed with repositioning. Pt premedicated prior to session.    Therapy/Group: Individual Therapy  [name] 11/19/2023, 3:20 PM

## 2023-11-19 NOTE — Progress Notes (Addendum)
 PROGRESS NOTE   Subjective/Complaints:  Pt doing well, slept well, denies pain currently, LBM yesterday, urinating fine. No other complaints or concerns.   ROS: as per HPI. Denies CP, SOB, abd pain, N/V/D/C, or any other complaints at this time.    Objective:   No results found.   No results for input(s): WBC, HGB, HCT, PLT in the last 72 hours.  No results for input(s): NA, K, CL, CO2, GLUCOSE, BUN, CREATININE, CALCIUM  in the last 72 hours.     Intake/Output Summary (Last 24 hours) at 11/19/2023 1248 Last data filed at 11/19/2023 0848 Gross per 24 hour  Intake 476 ml  Output 1125 ml  Net -649 ml        Physical Exam: Vital Signs Blood pressure 135/66, pulse 64, temperature 97.6 F (36.4 C), temperature source Oral, resp. rate 19, height 5' 7 (1.702 m), weight 62.4 kg, SpO2 94%.   General: No acute distress, resting in bed comfortably. Mood and affect are appropriate Heart: Regular rate and rhythm no rubs murmurs or extra sounds Lungs: Clear to auscultation, breathing unlabored, no rales or wheezes Abdomen: Positive bowel sounds, soft nontender to palpation, nondistended Extremities: No clubbing, cyanosis, or edema  PRIOR EXAMS: Skin: hip incision sites CDI   Pain with AROM Hip flexion but not PROM Neurologic: Cranial nerves II through XII intact, motor strength is 5/5 in bilateral deltoid, bicep, tricep, grip, right hip flexor, knee extensors, ankle dorsiflexor and plantar flexor 3-/5 Left HF, 3-/5 knee ext with pain inhibition 5/5 ADF  Musculoskeletal: Full range of motion in all 4 extremities. No joint swelling     Assessment/Plan: 1. Functional deficits which require 3+ hours per day of interdisciplinary therapy in a comprehensive inpatient rehab setting. Physiatrist is providing close team supervision and 24 hour management of active medical problems listed below. Physiatrist  and rehab team continue to assess barriers to discharge/monitor patient progress toward functional and medical goals  Care Tool:  Bathing    Body parts bathed by patient: Right arm, Left arm, Chest, Abdomen, Front perineal area, Buttocks, Right upper leg, Face, Left upper leg, Right lower leg, Left lower leg   Body parts bathed by helper: Buttocks     Bathing assist Assist Level: Contact Guard/Touching assist     Upper Body Dressing/Undressing Upper body dressing   What is the patient wearing?: Pull over shirt    Upper body assist Assist Level: Supervision/Verbal cueing    Lower Body Dressing/Undressing Lower body dressing      What is the patient wearing?: Pants     Lower body assist Assist for lower body dressing: Moderate Assistance - Patient 50 - 74%     Toileting Toileting    Toileting assist Assist for toileting: Minimal Assistance - Patient > 75%     Transfers Chair/bed transfer  Transfers assist     Chair/bed transfer assist level: Contact Guard/Touching assist     Locomotion Ambulation   Ambulation assist      Assist level: Contact Guard/Touching assist Assistive device: Walker-rolling Max distance: 113ft   Walk 10 feet activity   Assist  Walk 10 feet activity did not occur: Safety/medical concerns  Assist level:  Supervision/Verbal cueing Assistive device: Walker-rolling   Walk 50 feet activity   Assist Walk 50 feet with 2 turns activity did not occur: Safety/medical concerns  Assist level: Contact Guard/Touching assist Assistive device: Walker-rolling    Walk 150 feet activity   Assist Walk 150 feet activity did not occur: Safety/medical concerns  Assist level: Minimal Assistance - Patient > 75% Assistive device: Walker-rolling    Walk 10 feet on uneven surface  activity   Assist Walk 10 feet on uneven surfaces activity did not occur: Safety/medical concerns   Assist level: Minimal Assistance - Patient >  75% Assistive device: Walker-rolling   Wheelchair     Assist Is the patient using a wheelchair?: Yes Type of Wheelchair: Manual Wheelchair activity did not occur: Safety/medical concerns  Wheelchair assist level: Moderate Assistance - Patient 50 - 74%      Wheelchair 50 feet with 2 turns activity    Assist    Wheelchair 50 feet with 2 turns activity did not occur: Safety/medical concerns   Assist Level: Moderate Assistance - Patient 50 - 74%   Wheelchair 150 feet activity     Assist  Wheelchair 150 feet activity did not occur: Safety/medical concerns   Assist Level: Maximal Assistance - Patient 25 - 49%   Blood pressure 135/66, pulse 64, temperature 97.6 F (36.4 C), temperature source Oral, resp. rate 19, height 5' 7 (1.702 m), weight 62.4 kg, SpO2 94%.  Medical Problem List and Plan: 1. Functional deficits secondary to left three-part intertrochanteric hip fracture with subsequent IM nailing 11/01/2023.  Weightbearing as tolerated             -patient may shower with incisions covered -ELOS/Goals: 7/9, supervision PT/OT- discussed ALF plan with pt which I would recommend and pt is agreeable   -Continue CIR -Repeat xrays have good alignment of hardware, reviewed by Ortho PA as well  2.  Antithrombotics: -DVT/anticoagulation:  Mechanical: Antiembolism stockings, thigh (TED hose) Bilateral lower extremities.     Amb 150' with PT yesterday Sup/CGA           -antiplatelet therapy: Plavix  75 mg daily 3. Pain Management: tylenol  and Robaxin  as needed, discontinue IM Toradol  and begin tramadol  50mg  q6h PRN, may d/c robaxin  can use tramadol  more frequently but will reduce to 25mg  given daughter's concern about MS changes  Will try scheduling acetaminophen  but may require addition of tramadol   Other options include Butrans patch but will try above first  -6/30- pain if tramadol  not given in am, will schedule 25mg  q6 and monitor for sedation / confusion , if so switch to  butrans  4. Mood/Behavior/Sleep: Provide emotional support -antipsychotic agents: Seroquel  25 mg nightly started inpatient d/t delirum which later resolved -11/06/23 poor sleep, declined seroquel -- will d/c , daughter wants to avoid sedating meds will start melatonin 3mg  nightly; monitor sleep 6/24 d/c Seroquel  5. Neuropsych/cognition: This patient is not capable of making decisions on his own behalf. 6. Skin/Wound Care: Routine skin checks Per Ortho [facility] PA, sutures out y OV with Dr Celena 2 wk (or 1wk post hospital d/c) 7. Fluids/Electrolytes/Nutrition: Routine in and outs with follow-up chemistries, cont vitamins and supplements.     Latest Ref Rng & Units 11/07/2023    5:06 AM 11/04/2023    3:25 AM 11/03/2023    3:24 AM  BMP  Glucose 70 - 99 mg/dL 888  89  896   BUN 8 - 23 mg/dL 25  19  18    Creatinine 0.61 - 1.24  mg/dL 9.21  9.20  9.00   Sodium 135 - 145 mmol/L 136  137  137   Potassium 3.5 - 5.1 mmol/L 4.2  3.6  3.4   Chloride 98 - 111 mmol/L 105  104  104   CO2 22 - 32 mmol/L 25  26  25    Calcium  8.9 - 10.3 mg/dL 8.5  8.3  8.7     8.  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   Continue Plavix   9.  BPH.  Flomax  0.4 mg daily check PVRs  10.  Hypertension.  Monitor with increased mobility.  Benicar 5mg  daily currently on hold due to some soft blood pressures and resume as needed.   11/09/23 BPs mildly elevated , will see if this goes down with scheduled pain medication  -11/19/23 BPs improving and stable, monitor Vitals:   11/15/23 1321 11/15/23 1948 11/16/23 0438 11/16/23 1425  BP: 107/72 119/70 (!) 146/74 (!) 120/59   11/16/23 2031 11/17/23 0416 11/17/23 1525 11/17/23 2020  BP: 124/65 (!) 141/73 136/67 139/73   11/18/23 0334 11/18/23 1451 11/18/23 2015 11/19/23 0312  BP:  137/73 (!) 155/85 129/72 135/66   11. HLD: Crestor  20 mg daily 12. Urinary incont resolved, no retention  13. Anemia: 11-12 range, monitor labs    Latest Ref Rng & Units 11/07/2023    5:06 AM 11/04/2023    3:25 AM 11/03/2023    3:24 AM  CBC  WBC 4.0 - 10.5 K/uL 9.1  10.6  13.0   Hemoglobin 13.0 - 17.0 g/dL 89.1  88.8  86.5   Hematocrit 39.0 - 52.0 % 33.0  33.9  40.5   Platelets 150 - 400 K/uL 318  242  271     14. H/o hereditary spherocytosis s/p splenectomy 15. COPD: continue nebs/bronchodilators PRN Off O2 sats ok 16. Vit D deficiency, level 8.51 on 6/18, started on 50kU weekly 17. Constipation:   Improved LBM 11/18/23 18. GI ppx: pepcid  20mg  daily  19.  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 11/19/2023, 12:48 PM

## 2023-11-20 MED ORDER — TRAMADOL HCL 50 MG PO TABS
25.0000 mg | ORAL_TABLET | Freq: Three times a day (TID) | ORAL | Status: DC
  Administered 2023-11-20 – 2023-11-23 (×6): 25 mg via ORAL
  Filled 2023-11-20 (×7): qty 1

## 2023-11-20 NOTE — Progress Notes (Signed)
 PROGRESS NOTE   Subjective/Complaints:  Pt doing well again, slept great, denies pain currently, unsure of LBM but looks like it was 2 days ago, urinating fine. No other complaints or concerns.   ROS: as per HPI. Denies CP, SOB, abd pain, N/V/D/C, or any other complaints at this time.    Objective:   No results found.   No results for input(s): WBC, HGB, HCT, PLT in the last 72 hours.  No results for input(s): NA, K, CL, CO2, GLUCOSE, BUN, CREATININE, CALCIUM  in the last 72 hours.     Intake/Output Summary (Last 24 hours) at 11/20/2023 1152 Last data filed at 11/20/2023 1000 Gross per 24 hour  Intake 736 ml  Output 750 ml  Net -14 ml        Physical Exam: Vital Signs Blood pressure (!) 150/75, pulse 70, temperature 98.1 F (36.7 C), temperature source Oral, resp. rate 18, height 5' 7 (1.702 m), weight 62.4 kg, SpO2 95%.   General: No acute distress, upright in bed, eating breakfast. Mood and affect are appropriate Heart: Regular rate and rhythm no rubs murmurs or extra sounds Lungs: Clear to auscultation, breathing unlabored, no rales or wheezes Abdomen: Positive bowel sounds, soft nontender to palpation, nondistended Extremities: No clubbing, cyanosis, or edema  PRIOR EXAMS: Skin: hip incision sites CDI   Pain with AROM Hip flexion but not PROM Neurologic: Cranial nerves II through XII intact, motor strength is 5/5 in bilateral deltoid, bicep, tricep, grip, right hip flexor, knee extensors, ankle dorsiflexor and plantar flexor 3-/5 Left HF, 3-/5 knee ext with pain inhibition 5/5 ADF  Musculoskeletal: Full range of motion in all 4 extremities. No joint swelling     Assessment/Plan: 1. Functional deficits which require 3+ hours per day of interdisciplinary therapy in a comprehensive inpatient rehab setting. Physiatrist is providing close team supervision and 24 hour management of  active medical problems listed below. Physiatrist and rehab team continue to assess barriers to discharge/monitor patient progress toward functional and medical goals  Care Tool:  Bathing    Body parts bathed by patient: Right arm, Left arm, Chest, Abdomen, Front perineal area, Buttocks, Right upper leg, Face, Left upper leg, Right lower leg, Left lower leg   Body parts bathed by helper: Buttocks     Bathing assist Assist Level: Contact Guard/Touching assist     Upper Body Dressing/Undressing Upper body dressing   What is the patient wearing?: Pull over shirt    Upper body assist Assist Level: Supervision/Verbal cueing    Lower Body Dressing/Undressing Lower body dressing      What is the patient wearing?: Pants     Lower body assist Assist for lower body dressing: Moderate Assistance - Patient 50 - 74%     Toileting Toileting    Toileting assist Assist for toileting: Minimal Assistance - Patient > 75%     Transfers Chair/bed transfer  Transfers assist     Chair/bed transfer assist level: Contact Guard/Touching assist     Locomotion Ambulation   Ambulation assist      Assist level: Contact Guard/Touching assist Assistive device: Walker-rolling Max distance: 138ft   Walk 10 feet activity   Assist  Walk 10 feet activity did not occur: Safety/medical concerns  Assist level: Supervision/Verbal cueing Assistive device: Walker-rolling   Walk 50 feet activity   Assist Walk 50 feet with 2 turns activity did not occur: Safety/medical concerns  Assist level: Contact Guard/Touching assist Assistive device: Walker-rolling    Walk 150 feet activity   Assist Walk 150 feet activity did not occur: Safety/medical concerns  Assist level: Minimal Assistance - Patient > 75% Assistive device: Walker-rolling    Walk 10 feet on uneven surface  activity   Assist Walk 10 feet on uneven surfaces activity did not occur: Safety/medical concerns   Assist  level: Minimal Assistance - Patient > 75% Assistive device: Walker-rolling   Wheelchair     Assist Is the patient using a wheelchair?: Yes Type of Wheelchair: Manual Wheelchair activity did not occur: Safety/medical concerns  Wheelchair assist level: Moderate Assistance - Patient 50 - 74%      Wheelchair 50 feet with 2 turns activity    Assist    Wheelchair 50 feet with 2 turns activity did not occur: Safety/medical concerns   Assist Level: Moderate Assistance - Patient 50 - 74%   Wheelchair 150 feet activity     Assist  Wheelchair 150 feet activity did not occur: Safety/medical concerns   Assist Level: Maximal Assistance - Patient 25 - 49%   Blood pressure (!) 150/75, pulse 70, temperature 98.1 F (36.7 C), temperature source Oral, resp. rate 18, height 5' 7 (1.702 m), weight 62.4 kg, SpO2 95%.  Medical Problem List and Plan: 1. Functional deficits secondary to left three-part intertrochanteric hip fracture with subsequent IM nailing 11/01/2023.  Weightbearing as tolerated             -patient may shower with incisions covered -ELOS/Goals: 7/9, supervision PT/OT- discussed ALF plan with pt which I would recommend and pt is agreeable   -Continue CIR -Repeat xrays have good alignment of hardware, reviewed by Ortho PA as well  2.  Antithrombotics: -DVT/anticoagulation:  Mechanical: Antiembolism stockings, thigh (TED hose) Bilateral lower extremities.     Amb 150' with PT yesterday Sup/CGA           -antiplatelet therapy: Plavix  75 mg daily 3. Pain Management: tylenol  and Robaxin  as needed, discontinue IM Toradol  and begin tramadol  50mg  q6h PRN, may d/c robaxin  can use tramadol  more frequently but will reduce to 25mg  given daughter's concern about MS changes  Will try scheduling acetaminophen  but may require addition of tramadol   Other options include Butrans patch but will try above first  -6/30- pain if tramadol  not given in am, will schedule 25mg  q6 and monitor  for sedation / confusion , if so switch to butrans  4. Mood/Behavior/Sleep: Provide emotional support -antipsychotic agents: Seroquel  25 mg nightly started inpatient d/t delirum which later resolved -11/06/23 poor sleep, declined seroquel -- will d/c , daughter wants to avoid sedating meds will start melatonin 3mg  nightly; monitor sleep 6/24 d/c Seroquel  5. Neuropsych/cognition: This patient is not capable of making decisions on his own behalf. 6. Skin/Wound Care: Routine skin checks Per Ortho [facility] PA, sutures out y OV with Dr Celena 2 wk (or 1wk post hospital d/c) 7. Fluids/Electrolytes/Nutrition: Routine in and outs with follow-up chemistries, cont vitamins and supplements.     Latest Ref Rng & Units 11/07/2023    5:06 AM 11/04/2023    3:25 AM 11/03/2023    3:24 AM  BMP  Glucose 70 - 99 mg/dL 888  89  896   BUN 8 -  23 mg/dL 25  19  18    Creatinine 0.61 - 1.24 mg/dL 9.21  9.20  9.00   Sodium 135 - 145 mmol/L 136  137  137   Potassium 3.5 - 5.1 mmol/L 4.2  3.6  3.4   Chloride 98 - 111 mmol/L 105  104  104   CO2 22 - 32 mmol/L 25  26  25    Calcium  8.9 - 10.3 mg/dL 8.5  8.3  8.7     8.  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   Continue Plavix   9.  BPH.  Flomax  0.4 mg daily check PVRs  10.  Hypertension.  Monitor with increased mobility.  Benicar 5mg  daily currently on hold due to some soft blood pressures and resume as needed.   11/09/23 BPs mildly elevated , will see if this goes down with scheduled pain medication  -7/5-6/25 BPs improving and stable, monitor Vitals:   11/16/23 1425 11/16/23 2031 11/17/23 0416 11/17/23 1525  BP: (!) 120/59 124/65 (!) 141/73 136/67   11/17/23 2020 11/18/23 0334 11/18/23 1451 11/18/23 2015  BP: 139/73 137/73 (!) 155/85 129/72   11/19/23 0312 11/19/23  1414 11/19/23 2009 11/20/23 0435  BP: 135/66 125/72 122/69 (!) 150/75   11. HLD: Crestor  20 mg daily 12. Urinary incont resolved, no retention  13. Anemia: 11-12 range, monitor labs    Latest Ref Rng & Units 11/07/2023    5:06 AM 11/04/2023    3:25 AM 11/03/2023    3:24 AM  CBC  WBC 4.0 - 10.5 K/uL 9.1  10.6  13.0   Hemoglobin 13.0 - 17.0 g/dL 89.1  88.8  86.5   Hematocrit 39.0 - 52.0 % 33.0  33.9  40.5   Platelets 150 - 400 K/uL 318  242  271     14. H/o hereditary spherocytosis s/p splenectomy 15. COPD: continue nebs/bronchodilators PRN Off O2 sats ok 16. Vit D deficiency, level 8.51 on 6/18, started on 50kU weekly 17. Constipation:   Improved LBM 11/18/23 -11/20/23 no BM since 7/4, but seems he's been fairly regular lately- monitor for now 18. GI ppx: pepcid  20mg  daily  19.  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 11/20/2023, 11:52 AM

## 2023-11-20 NOTE — Progress Notes (Signed)
 Slept good. Did not wake patient to give scheduled ultram  at midnight. Scheduled melatonin and tylenol  given at 2135. Voids small frequent amounts. Spilled urinal this morning. LBM 07/04. Left hip incisions healed. Woke at 0400 disoriented to place and time. Larah Kuntzman A

## 2023-11-21 LAB — QUANTIFERON-TB GOLD PLUS (RQFGPL)
QuantiFERON Mitogen Value: 7.81 [IU]/mL
QuantiFERON Nil Value: 0.01 [IU]/mL
QuantiFERON TB1 Ag Value: 0 [IU]/mL
QuantiFERON TB2 Ag Value: 0 [IU]/mL

## 2023-11-21 LAB — QUANTIFERON-TB GOLD PLUS: QuantiFERON-TB Gold Plus: NEGATIVE

## 2023-11-21 NOTE — Progress Notes (Addendum)
 Patient ID: Markese Bloxham, male   DOB: 1930/12/15, 89 y.o.   MRN: [id number] Left message for Camille-daughter regarding bed offers via [facility] and [facility] and [facility] coming to evaluate pt today at 11:30 for their facility.   11:33 Am Spoke with Camille-daughter to let know she will need to choose one of the facilities and then once picked the insurance can be worked on for [employer]. She is aware [facility] will be here to evaluate and she will be meeting with them at 2:30 pm. Daughter wants to try to get SNF coverage and is aware it may not be covered since came here and is a CGA-supervision level  1:56 PM According to [facility] evaluation went well and will meet with daughter this afternoon.  2;41 PM Daughter has chosen [facility] and wants to move forward with insurance auth. Have called [facility] and left message for admissions coordinator regarding selection and need to begin insurance auth

## 2023-11-21 NOTE — Progress Notes (Signed)
 Physical Therapy Session Note  Patient Details  Name: Samuel Mata MRN: [id number] Date of Birth: January 22, 1931  Today's Date: 11/21/2023 PT Individual Time: 1020-1045 PT Individual Time Calculation: 25 min  Short Term Goals: Week 2:  PT Short Term Goal 1 (Week 2): STG = LTG d/t ELOS PT Short Term Goal 2 (Week 2): Pt will be able to perform sit to supine transfer with MI. PT Short Term Goal 3 (Week 2): Pt will be able to perform STS transfer CGA. PT Short Term Goal 4 (Week 2): Pt will be able to ambulate x 60 ft with RW with PT CGA  Skilled Therapeutic Interventions/Progress Updates:    Pt presents in room in [facility], agreeable to PT. Pt denies pain but reporting fatigue this AM. Session on therapeutic activities to promote upright tolerance and weightshifting needed for functional transfers and ambulation. Pt completes transfers with supervision and RW throughout session. Pt ambulates with RW 180' with supervision/CGA, slow gait speed decreased stance time LLE with pt demonstrating L knee flexed in stance but no buckle, improved proximity to RW. Activity completed to promote tolerance to upright. Pt then completes therapeutic activity to promote weightshifting, 2x20 step taps 4 step with BUE support on RW, completes with CGA. Pt remains seated in WC in dayroom for handoff to OT at end of session.   Therapy Documentation Precautions:  Precautions Precautions: Fall Recall of Precautions/Restrictions: Intact Precaution/Restrictions Comments: pt able to tolerate activity on room air Restrictions Weight Bearing Restrictions Per Provider Order: Yes LLE Weight Bearing Per Provider Order: Weight bearing as tolerated    Therapy/Group: Individual Therapy  Reche Ohara PT, DPT 11/21/2023, 3:55 PM

## 2023-11-21 NOTE — Progress Notes (Signed)
 Physical Therapy Session Note  Patient Details  Name: Samuel Mata MRN: [id number] Date of Birth: 1930-08-11  Today's Date: 11/21/2023 PT Individual Time: 1421-1502 PT Individual Time Calculation (min): 41 min   Short Term Goals: Week 3:    Week 4:     Skilled Therapeutic Interventions/Progress Updates:      Therapy Documentation Precautions:  Precautions Precautions: Fall Recall of Precautions/Restrictions: Intact Precaution/Restrictions Comments: pt able to tolerate activity on room air Restrictions Weight Bearing Restrictions Per Provider Order: Yes LLE Weight Bearing Per Provider Order: Weight bearing as tolerated General: PT Amount of Missed Time (min): 30 Minutes PT Missed Treatment Reason: Patient fatigue Vital Signs: Therapy Vitals Temp: 98.1 F (36.7 C) Pulse Rate: 75 Resp: 18 BP: 127/79 Patient Position (if appropriate): Sitting Oxygen Therapy SpO2: 93 % O2 Device: Room Air Pain:     Therapy/Group: Individual Therapy  [name] PT, DPT, CSRS 11/21/2023, 5:30 PM

## 2023-11-21 NOTE — Progress Notes (Signed)
 Physical Therapy Session Note  Patient Details  Name: Luiz Trumpower MRN: [id number] Date of Birth: [birth year]  Today's Date: 11/21/2023 PT Missed Time: 30 Minutes Missed Time Reason: Patient fatigue  Short Term Goals: Week 1:  PT Short Term Goal 1 (Week 1): Pt will be able to perform supine to sit transfer with MI. PT Short Term Goal 1 - Progress (Week 1): Progressing toward goal PT Short Term Goal 2 (Week 1): Pt will be able to perform sit to supine transfer with MI. PT Short Term Goal 2 - Progress (Week 1): Progressing toward goal PT Short Term Goal 3 (Week 1): Pt will be able to perform STS transfer CGA. PT Short Term Goal 3 - Progress (Week 1): Met PT Short Term Goal 4 (Week 1): Pt will be able to ambulate x 60 ft with RW with PT CGA PT Short Term Goal 4 - Progress (Week 1): Met Week 2:  PT Short Term Goal 1 (Week 2): STG = LTG d/t ELOS PT Short Term Goal 2 (Week 2): Pt will be able to perform sit to supine transfer with MI. PT Short Term Goal 3 (Week 2): Pt will be able to perform STS transfer CGA. PT Short Term Goal 4 (Week 2): Pt will be able to ambulate x 60 ft with RW with PT CGA  Skilled Therapeutic Interventions/Progress Updates:  Patient supine in bed and asleep with sleep mask donned on entrance to room. Prior to session, OT relates that in previous session, pt is very fatigued at end and is falling asleep at end of session. Pt was helped to bed and is still asleep.   [name] morning has been busy with therapy sessionsand is understandably fatigued. Allowed to rest this session as pt has another session with this PT in pm.   Patient with no appearance of pain while sleeping.   Patient supine in bed asleep at end of session with brakes locked, bed alarm set, and all needs within reach.  Pt missed of skilled therapy due to fatigue. Will re-attempt as schedule and pt availability permits.    Therapy Documentation Precautions:  Precautions Precautions: Fall Recall  of Precautions/Restrictions: Intact Precaution/Restrictions Comments: pt able to tolerate activity on room air Restrictions Weight Bearing Restrictions Per Provider Order: Yes LLE Weight Bearing Per Provider Order: Weight bearing as tolerated  Therapy/Group: Individual Therapy  [name] 11/21/2023, 2:45 PM

## 2023-11-21 NOTE — Progress Notes (Signed)
 Occupational Therapy Session Note  Patient Details  Name: Samuel Mata MRN: [id number] Date of Birth: Nov 14, 1930  Today's Date: 11/21/2023 OT Individual Time: 0905-1000 OT Individual Time Calculation (min): 55 min  OT Individual Time: 8954-8884 OT Individual Time Calculation (min): 27 min   OT Short Term Goals Week 2:  OT Short Term Goal 1 (Week 2): STG=LTG d/t ELOS  Skilled Therapeutic Interventions/Progress Updates:     Session 1:  Pt received siting up in wc presenting to be in good spirits receptive to skilled OT session reporting 0/10 pain- OT offering intermittent rest breaks, repositioning, and therapeutic support to optimize participation in therapy session. Pt declining shower this AM, however receptive to completing sponge bath and getting dressed to participate in his morning routine. Focused this session on ADL retraining, dynamic balance, and UB  strengthening.   Mobility: -Short distance functional mobility and ambulatory transfers using RW with SBA-CGA this session. During first ambulatory transfer, Pt with mild LOB requiring min A to correct, however no other significant LOB noted during session.   ADLs:  -Toileting: 3/3 toileting tasks completed on standard toilet with SBA following continent void and utilizing RW in standing position to increase safety.  -Bathing: Pt completed sponge bath at sink while seated in wc. UB bathing completed with SUP and LB bathing completed with CGA only when standing to wash peri areas.  -Dressing: Pt able to donn [place] shirt and jacket with SUP in seated position. Donned pants with CGA using hemi-technique with CGA +increased time. Pt with improved tolerance of crossing L LE into figure-four this session.  -Grooming/hygiene: Pt shaved his face, completed oral care, and groomed his hair mod I in seated position.   Therapeutic exercise:  -Engaged Pt in completing series of UB exercises in standing position to increase overall UB strength and  dynamic standing balance for ADLs. Pt completed the following exercises using 3# weighted dowel with CGA provided for balance and seated rest breaks provided between each exercise for energy conservation:   -Chest Press, 2x10 reps  -Row with overhead press, 2x10 reps  -Bicep curls, 2x10 reps  -Anterior shoulder flexion, 2x10 reps Pt with posterior lean in standing, however aware of this and able to correct with CGA and verbal cues to bring weight into his toes.   Pt was left resting in wc with call bell in reach, seatbelt alarm on, and all needs met.    Session 2:  Pt received sitting up in wc in therapy gym following PT session. Pt presenting to be sleepy, however in good spirits receptive to skilled OT session reporting 0/10 pain- OT offering intermittent rest breaks, repositioning, and therapeutic support to optimize participation in therapy session. Focused this session on endurance training to increase overall independence in ADLs.   Mobility:  -Transfers: Pt completed stand pivot transfers from wc <> NuStep and wc > bed using RW with CGA +verbal cues for technique and RW positioning.  -Bed mobility: Pt returned to bed at end of session with SBA provided for safety.   Therapeutic Exercise:  -Engaged Pt completing B U/LE endurance training on NuStep to work on activity tolerance and improve reciprocal B U/LE movements for increased safety and independence in ADLs. Pt able to tolerate completing total of 8 minutes on level 4 setting with seated rest break provided halfway during and following exercise. Pt maintaining rate of 60-70 SPM consistently during exercise. Pt falling asleep during 2nd rest break, requesting to return to room and get in bed for  a nap.   Pt transported back to room total A via wc. Pt was left resting in bed with call bell in reach, bed alarm on, and all needs met.    Therapy Documentation Precautions:  Precautions Precautions: Fall Recall of  Precautions/Restrictions: Intact Precaution/Restrictions Comments: pt able to tolerate activity on room air Restrictions Weight Bearing Restrictions Per Provider Order: Yes LLE Weight Bearing Per Provider Order: Weight bearing as tolerated   Therapy/Group: Individual Therapy  [name] 11/21/2023, 8:05 AM

## 2023-11-21 NOTE — Progress Notes (Signed)
 PROGRESS NOTE   Subjective/Complaints:  The patient has no complaints today, pain is well-controlled except during mobility.  ROS: as per HPI. Denies CP, SOB, abd pain, N/V/D/C, or any other complaints at this time.    Objective:   No results found.   No results for input(s): WBC, HGB, HCT, PLT in the last 72 hours.  No results for input(s): NA, K, CL, CO2, GLUCOSE, BUN, CREATININE, CALCIUM  in the last 72 hours.     Intake/Output Summary (Last 24 hours) at 11/21/2023 0955 Last data filed at 11/21/2023 0742 Gross per 24 hour  Intake 778 ml  Output 700 ml  Net 78 ml        Physical Exam: Vital Signs Blood pressure (!) 140/80, pulse 68, temperature 98.3 F (36.8 C), temperature source Oral, resp. rate 16, height 5' 7 (1.702 m), weight 62.2 kg, SpO2 94%.   General: No acute distress, upright in bed, eating breakfast. Mood and affect are appropriate Heart: Regular rate and rhythm no rubs murmurs or extra sounds Lungs: Clear to auscultation, breathing unlabored, no rales or wheezes Abdomen: Positive bowel sounds, soft nontender to palpation, nondistended Extremities: No clubbing, cyanosis, or edema  PRIOR EXAMS: Skin: hip incision sites CDI   Pain with AROM Hip flexion but not PROM Neurologic: Cranial nerves II through XII intact, motor strength is 5/5 in bilateral deltoid, bicep, tricep, grip, right hip flexor, knee extensors, ankle dorsiflexor and plantar flexor 3-/5 Left HF, 3-/5 knee ext with pain inhibition 5/5 ADF  Musculoskeletal: Full range of motion in all 4 extremities. No joint swelling     Assessment/Plan: 1. Functional deficits which require 3+ hours per day of interdisciplinary therapy in a comprehensive inpatient rehab setting. Physiatrist is providing close team supervision and 24 hour management of active medical problems listed below. Physiatrist and rehab team continue  to assess barriers to discharge/monitor patient progress toward functional and medical goals  Care Tool:  Bathing    Body parts bathed by patient: Right arm, Left arm, Chest, Abdomen, Front perineal area, Buttocks, Right upper leg, Face, Left upper leg, Right lower leg, Left lower leg   Body parts bathed by helper: Buttocks     Bathing assist Assist Level: Contact Guard/Touching assist     Upper Body Dressing/Undressing Upper body dressing   What is the patient wearing?: Pull over shirt    Upper body assist Assist Level: Supervision/Verbal cueing    Lower Body Dressing/Undressing Lower body dressing      What is the patient wearing?: Pants     Lower body assist Assist for lower body dressing: Moderate Assistance - Patient 50 - 74%     Toileting Toileting    Toileting assist Assist for toileting: Minimal Assistance - Patient > 75%     Transfers Chair/bed transfer  Transfers assist     Chair/bed transfer assist level: Contact Guard/Touching assist     Locomotion Ambulation   Ambulation assist      Assist level: Contact Guard/Touching assist Assistive device: Walker-rolling Max distance: 161ft   Walk 10 feet activity   Assist  Walk 10 feet activity did not occur: Safety/medical concerns  Assist level: Supervision/Verbal cueing Assistive device:  Walker-rolling   Walk 50 feet activity   Assist Walk 50 feet with 2 turns activity did not occur: Safety/medical concerns  Assist level: Contact Guard/Touching assist Assistive device: Walker-rolling    Walk 150 feet activity   Assist Walk 150 feet activity did not occur: Safety/medical concerns  Assist level: Minimal Assistance - Patient > 75% Assistive device: Walker-rolling    Walk 10 feet on uneven surface  activity   Assist Walk 10 feet on uneven surfaces activity did not occur: Safety/medical concerns   Assist level: Minimal Assistance - Patient > 75% Assistive device:  Walker-rolling   Wheelchair     Assist Is the patient using a wheelchair?: Yes Type of Wheelchair: Manual Wheelchair activity did not occur: Safety/medical concerns  Wheelchair assist level: Moderate Assistance - Patient 50 - 74%      Wheelchair 50 feet with 2 turns activity    Assist    Wheelchair 50 feet with 2 turns activity did not occur: Safety/medical concerns   Assist Level: Moderate Assistance - Patient 50 - 74%   Wheelchair 150 feet activity     Assist  Wheelchair 150 feet activity did not occur: Safety/medical concerns   Assist Level: Maximal Assistance - Patient 25 - 49%   Blood pressure (!) 140/80, pulse 68, temperature 98.3 F (36.8 C), temperature source Oral, resp. rate 16, height 5' 7 (1.702 m), weight 62.2 kg, SpO2 94%.  Medical Problem List and Plan: 1. Functional deficits secondary to left three-part intertrochanteric hip fracture with subsequent IM nailing 11/01/2023.  Weightbearing as tolerated             -patient may shower with incisions covered -ELOS/Goals: 7/9, supervision PT/OT- discussed ALF plan with pt which I would recommend and pt is agreeable   -Continue CIR, medically stable for discharge 7/9 -Repeat xrays have good alignment of hardware, reviewed by Ortho PA as well  2.  Antithrombotics: -DVT/anticoagulation:  Mechanical: Antiembolism stockings, thigh (TED hose) Bilateral lower extremities.     Amb 150' with PT yesterday Sup/CGA           -antiplatelet therapy: Plavix  75 mg daily 3. Pain Management: tylenol  and Robaxin  as needed, discontinue IM Toradol  and begin tramadol  50mg  q6h PRN, may d/c robaxin  can use tramadol  more frequently but will reduce to 25mg  given daughter's concern about MS changes  Will try scheduling acetaminophen  but may require addition of tramadol    -6/30- pain if tramadol  not given in am, will schedule 25mg  q6 and monitor for sedation / confusion , well-controlled on current medications 4.  Mood/Behavior/Sleep: Provide emotional support -antipsychotic agents: Seroquel  25 mg nightly started inpatient d/t delirum which later resolved -11/06/23 poor sleep, declined seroquel -- will d/c , daughter wants to avoid sedating meds will start melatonin 3mg  nightly; monitor sleep 6/24 d/c Seroquel  5. Neuropsych/cognition: This patient is not capable of making decisions on his own behalf. 6. Skin/Wound Care: Routine skin checks Per Ortho [facility] PA, sutures out y OV with Dr Celena 2 wk (or 1wk post hospital d/c) 7. Fluids/Electrolytes/Nutrition: Routine in and outs with follow-up chemistries, cont vitamins and supplements.     Latest Ref Rng & Units 11/07/2023    5:06 AM 11/04/2023    3:25 AM 11/03/2023    3:24 AM  BMP  Glucose 70 - 99 mg/dL 888  89  896   BUN 8 - 23 mg/dL 25  19  18    Creatinine 0.61 - 1.24 mg/dL 9.21  9.20  9.00   Sodium 135 -  145 mmol/L 136  137  137   Potassium 3.5 - 5.1 mmol/L 4.2  3.6  3.4   Chloride 98 - 111 mmol/L 105  104  104   CO2 22 - 32 mmol/L 25  26  25    Calcium  8.9 - 10.3 mg/dL 8.5  8.3  8.7     8.  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   Continue Plavix   9.  BPH.  Flomax  0.4 mg daily check PVRs  10.  Hypertension.  Monitor with increased mobility.  Benicar 5mg  daily currently on hold due to some soft blood pressures and resume as needed.   11/09/23 BPs mildly elevated , will see if this goes down with scheduled pain medication  -7/5-6/25 BPs improving and stable, monitor Vitals:   11/17/23 1525 11/17/23 2020 11/18/23 0334 11/18/23 1451  BP: 136/67 139/73 137/73 (!) 155/85   11/18/23 2015 11/19/23 0312 11/19/23 1414 11/19/23 2009  BP: 129/72 135/66 125/72 122/69   11/20/23 0435 11/20/23 1400 11/20/23 1942 11/21/23 0415  BP: (!) 150/75 (!) 150/78  (!) 147/64 (!) 140/80   11. HLD: Crestor  20 mg daily 12. Urinary incont resolved, no retention  13. Anemia: 11-12 range, monitor labs    Latest Ref Rng & Units 11/07/2023    5:06 AM 11/04/2023    3:25 AM 11/03/2023    3:24 AM  CBC  WBC 4.0 - 10.5 K/uL 9.1  10.6  13.0   Hemoglobin 13.0 - 17.0 g/dL 89.1  88.8  86.5   Hematocrit 39.0 - 52.0 % 33.0  33.9  40.5   Platelets 150 - 400 K/uL 318  242  271     14. H/o hereditary spherocytosis s/p splenectomy 15. COPD: continue nebs/bronchodilators PRN Off O2 sats ok 16. Vit D deficiency, level 8.51 on 6/18, started on 50kU weekly 17. Constipation:   Improved LBM 11/18/23 -11/20/23 no BM since 7/4, but seems he's been fairly regular lately- monitor for now 18. GI ppx: pepcid  20mg  daily  19.  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

## 2023-11-21 NOTE — Progress Notes (Signed)
 Slept good. HOH with aides. Attempted to get OOB without assistance 2 times this shift. Bed alarm alerted staff. Small incontinent voids x 2, in brief, otherwise used the urinal or assisted to bathroom. Left hip incision healed. Sharrell Krawiec A

## 2023-11-22 ENCOUNTER — Other Ambulatory Visit (HOSPITAL_COMMUNITY): Payer: Self-pay

## 2023-11-22 MED ORDER — CLOPIDOGREL BISULFATE 75 MG PO TABS
75.0000 mg | ORAL_TABLET | Freq: Every day | ORAL | 0 refills | Status: AC
  Filled 2023-11-22: qty 30, 30d supply, fill #0

## 2023-11-22 MED ORDER — TRAMADOL HCL 50 MG PO TABS
50.0000 mg | ORAL_TABLET | Freq: Four times a day (QID) | ORAL | 0 refills | Status: DC | PRN
  Filled 2023-11-22: qty 20, 5d supply, fill #0

## 2023-11-22 MED ORDER — FAMOTIDINE 20 MG PO TABS
20.0000 mg | ORAL_TABLET | Freq: Every day | ORAL | 0 refills | Status: AC
  Filled 2023-11-22: qty 30, 30d supply, fill #0

## 2023-11-22 MED ORDER — ROSUVASTATIN CALCIUM 20 MG PO TABS
20.0000 mg | ORAL_TABLET | Freq: Every day | ORAL | 0 refills | Status: AC
  Filled 2023-11-22: qty 30, 30d supply, fill #0

## 2023-11-22 MED ORDER — FLUTICASONE PROPIONATE 50 MCG/ACT NA SUSP
2.0000 | Freq: Every day | NASAL | 0 refills | Status: DC | PRN
  Filled 2023-11-22: qty 16, 30d supply, fill #0

## 2023-11-22 MED ORDER — VITAMIN D (ERGOCALCIFEROL) 1.25 MG (50000 UNIT) PO CAPS
50000.0000 [IU] | ORAL_CAPSULE | ORAL | 0 refills | Status: AC
  Filled 2023-11-22: qty 5, 35d supply, fill #0

## 2023-11-22 MED ORDER — DOCUSATE SODIUM 100 MG PO CAPS: 100.0000 mg | ORAL_CAPSULE | Freq: Two times a day (BID) | ORAL | Status: AC

## 2023-11-22 MED ORDER — MELATONIN 3 MG PO TABS
3.0000 mg | ORAL_TABLET | Freq: Every day | ORAL | 0 refills | Status: AC
  Filled 2023-11-22: qty 30, 30d supply, fill #0

## 2023-11-22 MED ORDER — TAMSULOSIN HCL 0.4 MG PO CAPS
0.4000 mg | ORAL_CAPSULE | Freq: Every day | ORAL | 0 refills | Status: AC
  Filled 2023-11-22: qty 30, 30d supply, fill #0

## 2023-11-22 MED ORDER — POLYETHYLENE GLYCOL 3350 17 G PO PACK: 17.0000 g | PACK | Freq: Every day | ORAL | Status: DC

## 2023-11-22 NOTE — Progress Notes (Signed)
 Inpatient Rehabilitation Discharge Medication Review by a Pharmacist  A complete drug regimen review was completed for this patient to identify any potential clinically significant medication issues.  High Risk Drug Classes Is patient taking? Indication by Medication  Antipsychotic No   Anticoagulant No   Antibiotic No   Opioid Yes Tramadol  PRN - Pain  Antiplatelet Yes Plavix  - ASCVD prevention  Hypoglycemics/insulin No   Vasoactive Medication Yes Tamsulosin  - BPH NTG - CP  Chemotherapy No   Other Yes Colace/miralax  - Constipation Pepcid  - GERD Vitamin D /MVI - supplementation Rosuvastatin  - ASCVD Melatonin - sleep  Tears - dry eyes     Type of Medication Issue Identified Description of Issue Recommendation(s)  Drug Interaction(s) (clinically significant)     Duplicate Therapy     Allergy     No Medication Administration End Date     Incorrect Dose     Additional Drug Therapy Needed     Significant med changes from prior encounter (inform family/care partners about these prior to discharge).     Other       Clinically significant medication issues were identified that warrant physician communication and completion of prescribed/recommended actions by midnight of the next day:  No   Pharmacist comments: N/a  Time spent performing this drug regimen review (minutes):  20   Sergio Batch, PharmD, [place], AAHIVP, CPP Infectious Disease Pharmacist 11/22/2023 12:37 PM

## 2023-11-22 NOTE — Progress Notes (Signed)
 Occupational Therapy Discharge Summary  Patient Details  Name: Samuel Mata MRN: [id number] Date of Birth: 09-02-1930  Date of Discharge from OT service:November 22, 2023  Patient has met 6 of 6 long term goals due to improved activity tolerance, improved balance, postural control, ability to compensate for deficits, functional use of  LEFT lower extremity, improved awareness, and improved coordination.  Patient to discharge at overall Supervision level.  Patient's care partner is independent to provide the necessary physical and cognitive assistance at discharge. Pt will be d/c'ing to ALF.  Reasons goals not met: SBA-CGA recommended for toilet transfers to increase [name] safety at d/c.   Recommendation:  Patient will benefit from ongoing skilled OT services in home health setting to continue to advance functional skills in the area of BADL, iADL, and Reduce care partner burden.  Equipment: No equipment provided  Reasons for discharge: treatment goals met and discharge from hospital  Patient/family agrees with progress made and goals achieved: Yes  OT Discharge Precautions/Restrictions  Precautions Precautions: Fall Recall of Precautions/Restrictions: Intact Restrictions Weight Bearing Restrictions Per Provider Order: Yes LLE Weight Bearing Per Provider Order: Weight bearing as tolerated Pain Pain Assessment Pain Scale: 0-10 Pain Score: 0-No pain ADL ADL Equipment Provided: Long-handled sponge, Reacher Eating: Modified independent Where Assessed-Eating: Chair Grooming: Modified independent Where Assessed-Grooming: Sitting at sink Upper Body Bathing: Supervision/safety Where Assessed-Upper Body Bathing: Shower Lower Body Bathing: Supervision/safety Where Assessed-Lower Body Bathing: Shower Upper Body Dressing: Supervision/safety Where Assessed-Upper Body Dressing: Wheelchair Lower Body Dressing: Contact guard Where Assessed-Lower Body Dressing: Wheelchair Toileting:  Supervision/safety Where Assessed-Toileting: Teacher, adult education: Furniture conservator/restorer Method: Proofreader: Engineer, technical sales: Scientific laboratory technician Method: Ship broker: Grab bars, Insurance underwriter: Administrator, arts Method: Designer, industrial/product: Emergency planning/management officer, Grab bars ADL Comments: shower was in second session, increased difficulty with transfers in afternoon d/t increased pain and difficulty with advancing RLE Vision Baseline Vision/History: 1 Wears glasses Patient Visual Report: Other (comment) Vision Assessment?: Wears glasses for reading Perception  Perception: Within Functional Limits Praxis Praxis: WFL Cognition Cognition Overall Cognitive Status: History of cognitive impairments - at baseline (mild memory deficits at baseline) Arousal/Alertness: Awake/alert Orientation Level: Person;Situation;Place Person: Oriented Place: Oriented Situation: Oriented Memory: Impaired (impaired at baseline) Memory Impairment: Decreased short term memory;Decreased recall of new information Awareness: Impaired Awareness Impairment: Anticipatory impairment Problem Solving: Impaired Sequencing: Impaired Safety/Judgment: Impaired Brief Interview for Mental Status (BIMS) Repetition of Three Words (First Attempt): 3 Temporal Orientation: Year: Correct Temporal Orientation: Month: Accurate within 5 days Temporal Orientation: Day: Correct Recall: Sock: Yes, no cue required Recall: Blue: Yes, no cue required Recall: Bed: Yes, no cue required BIMS Summary Score: 15 Sensation Sensation Light Touch: Appears Intact Hot/Cold: Appears Intact Proprioception: Appears Intact Stereognosis: Appears Intact Coordination Gross Motor Movements are Fluid and Coordinated: No Fine Motor Movements are Fluid and Coordinated: Yes Coordination and  Movement Description: mild gross motor deficit 2/2 weakness/pain in LLE, improvement from eval Motor  Motor Motor - Discharge Observations: Mild balance deficit 2/2 weakness/pain Mobility  Bed Mobility Bed Mobility: Supine to Sit;Sit to Supine Supine to Sit: Independent with assistive device Sit to Supine: Independent with assistive device Transfers Sit to Stand: Supervision/Verbal cueing Stand to Sit: Supervision/Verbal cueing  Trunk/Postural Assessment  Cervical Assessment Cervical Assessment: Exceptions to [facility] (forward head) Thoracic Assessment Thoracic Assessment: Exceptions to [facility] (rounded shoulders) Lumbar Assessment Lumbar Assessment: Exceptions to [facility] (posterior pelvic tilt) Postural Control  Postural Control: Deficits on evaluation Righting Reactions: delayed Protective Responses: delayed  Balance Balance Balance Assessed: Yes Static Sitting Balance Static Sitting - Balance Support: Feet supported;No upper extremity supported Static Sitting - Level of Assistance: 6: Modified independent (Device/Increase time) Dynamic Sitting Balance Dynamic Sitting - Balance Support: Feet supported Dynamic Sitting - Level of Assistance: 6: Modified independent (Device/Increase time) Static Standing Balance Static Standing - Balance Support: During functional activity;Bilateral upper extremity supported Static Standing - Level of Assistance: 6: Modified independent (Device/Increase time) Dynamic Standing Balance Dynamic Standing - Balance Support: During functional activity;Bilateral upper extremity supported Dynamic Standing - Level of Assistance: 5: Stand by assistance Extremity/Trunk Assessment RUE Assessment RUE Assessment: Within Functional Limits LUE Assessment LUE Assessment: Within Functional Limits   Samuel Mata 11/22/2023, 12:28 PM

## 2023-11-22 NOTE — Progress Notes (Addendum)
 Occupational Therapy Session Note  Patient Details  Name: Samuel Mata MRN: [id number] Date of Birth: 01-15-1931  Today's Date: 11/22/2023 OT Individual Time: 8950-8856 OT Individual Time Calculation (min): 54 min  and Today's Date: 11/22/2023 OT Missed Time: 21 Minutes Missed Time Reason: Patient fatigue  OT Short Term Goals Week 2:  OT Short Term Goal 1 (Week 2): STG=LTG d/t ELOS OT Short Term Goal 1 - Progress (Week 2): Progressing toward goal Week 3:  OT Short Term Goal 1 (Week 3): STG=LTG d/t ELOS  Skilled Therapeutic Interventions/Progress Updates:     Pt received siting up in recliner, dressed for the day upon OT arrival. Pt presenting to be in good spirits receptive to skilled OT session reporting 0/10 pain- OT offering intermittent rest breaks, repositioning, and therapeutic support to optimize participation in therapy session. Pt noted to be more fatigued this session compared to previous session, politely declining to take shower d/t fatigue level. Spent time at beginning of session providing updates to Pt on current d/c plans and educating on option of ALF vs SNF with Pt receptive to education and receptive to d/c'ing to whichever setting he would be safest in. His personal LTG is to return home with intermittent supervision and assistance PRN. Pt requesting to use restroom d/t need for BM. Engaged Pt in completing functional mobility to bathroom using RW for endurance training- he was able to complete ~15 ft with CGA this trial, decreased step length and increased reliance on B UE support on RW noted 2/2 to fatigue. Pt transferred to standard toilet using RW and grab bar with CGA +min verbal cues for hand placement and RW positioning. Increased time provided on toilet d/t need for BM- continent B&B documented in flowsheets. He was able to perform posterior peri care in sitting with set-up A and perform clothing management with min A to bring pants over his waist in standing using RW for  balance. Seated rest break provided following. Pt then completed functional mobility to sink and stood using RW to wash hands with CGA, no LOB. He reported increased fatigue and requested time to rest. He completed functional mobility back to recliner with CGA using RW with seated rest break provided. During rest break, provided education on fall prevention, energy conservation strategies, and AE he can utilize such as a Sports administrator to increase independence with Pt receptive to education. Pt fatigued, requesting to finish session early. Pt was left resting in recliner with call bell in reach and all needs met.  Missed 21 minutes skilled OT treatment d/t fatigue, will attempt to make up time as [name] status and schedule allow.   Therapy Documentation Precautions:  Precautions Precautions: Fall Recall of Precautions/Restrictions: Intact Precaution/Restrictions Comments: pt able to tolerate activity on room air Restrictions Weight Bearing Restrictions Per Provider Order: Yes LLE Weight Bearing Per Provider Order: Weight bearing as tolerated   Therapy/Group: Individual Therapy  [name] 11/22/2023, 11:07 AM

## 2023-11-22 NOTE — Progress Notes (Signed)
 Physical Therapy Discharge Summary  Patient Details  Name: Samuel Mata MRN: [id number] Date of Birth: Jul 11, 1930  Date of Discharge from PT service:{Time; dates multiple:304500300}  {CHL IP REHAB PT TIME CALCULATION:304800500}   Patient has met {NUMBERS 0-12:18577} of {NUMBERS 0-12:18577} long term goals due to {due un:6958322}.  Patient to discharge at [facility] level {LOA:3049010}.   Patient's care partner {care partner:3041650} to provide the necessary {assistance:3041652} assistance at discharge.  Reasons goals not met: ***  Recommendation:  Patient will benefit from ongoing skilled PT services in {setting:3041680} to continue to advance safe functional mobility, address ongoing impairments in ***, and minimize fall risk.  Equipment: {equipment:3041657}  Reasons for discharge: {Reason for discharge:3049018}  Patient/family agrees with progress made and goals achieved: {Pt/Family agree with progress/goals:3049020}  PT Discharge Precautions/Restrictions   Vital Signs Therapy Vitals Temp: (!) 97.5 F (36.4 C) Temp Source: Oral Pulse Rate: 66 Resp: 18 BP: (!) 143/69 Patient Position (if appropriate): Lying Oxygen Therapy SpO2: 94 % O2 Device: Room Air Pain Pain Assessment Pain Scale: 0-10 Pain Score: 0-No pain Pain Interference   Vision/Perception     Cognition   Sensation   Motor     Mobility   Locomotion     Trunk/Postural Assessment     Balance   Extremity Assessment            Samuel Mata 11/22/2023, 8:14 AM

## 2023-11-22 NOTE — Progress Notes (Addendum)
 Patient ID: Sheri Prows, male   DOB: 1930-05-25, 89 y.o.   MRN: [id number] Daughter feels needs skilled and [facility] they feel is not able to meet his needs. Plan currently is to see if [facility] will cover [facility] will not be discharging tomorrow.  8;55 AM message left for Tony-administrator of [facility] since admissions-[facility] is not in today. Need for them to start insurance approval process. Spoke with Camille-daughter who reports if can be approved will transfer there otherwise if not will go to [facility]. Await return call from [place]  10;40 AM received message from [place] at [facility] they can not meet pt's needs now. Have attempted to call the facility and they are having phone issues. Will call daughter to update. Daughter will call and see if can reach Adm-Malik since spoken with his yesterday when toured.  11;21 AM Daughter attempted to call [facility] also and could not get through so plans to go there and talk with ADM-Malik  12:03 PM Spoke with Camille-daughter who did speak with adm at [facility] who reports due to his behavior documented two weeks ago can not accept him. Daughter has decided to go with [facility] and will plan to transfer there tomorrow. Will order hospital bed and wheelchair and HH. Facility uses [facility] will let team and MD know of plan for tomorrow  12;25 PM Have sent TB results to [facility]

## 2023-11-22 NOTE — Progress Notes (Signed)
 Physical Therapy Session Note  Patient Details  Name: Samuel Mata MRN: [id number] Date of Birth: 1930/07/19  {CHL IP REHAB PT TIME CALCULATION:304800500}  Short Term Goals: {DUH:6958314}  Skilled Therapeutic Interventions/Progress Updates:      - discussed taking pain mediation at 6:15 in the am and relates he didn't ask for it. Asked if he remembers why he's taking pain medi cation and does not talk re: hip sx Therapy Documentation Precautions:  Precautions Precautions: Fall Recall of Precautions/Restrictions: Intact Precaution/Restrictions Comments: pt able to tolerate activity on room air Restrictions Weight Bearing Restrictions Per Provider Order: Yes LLE Weight Bearing Per Provider Order: Weight bearing as tolerated General:   Vital Signs: Therapy Vitals Temp: (!) 97.5 F (36.4 C) Temp Source: Oral Pulse Rate: 66 Resp: 18 BP: (!) 143/69 Patient Position (if appropriate): Lying Oxygen Therapy SpO2: 94 % O2 Device: Room Air Pain: Pain Assessment Pain Scale: 0-10 Pain Score: 0-No pain Mobility:   Locomotion :    Trunk/Postural Assessment :    Balance:   Exercises:   Other Treatments:      Therapy/Group: {Therapy/Group:3049007}  [name] 11/22/2023, 8:13 AM

## 2023-11-22 NOTE — Progress Notes (Signed)
 Inpatient Rehabilitation Care Coordinator Discharge Note   Patient Details  Name: Samuel Mata MRN: [id number] Date of Birth: Jul 08, 1930   Discharge location: GOING TO [facility]  Length of Stay: 18 DAYS  Discharge activity level: SUPERVISION-CGA LEVEL  Home/community participation: ACTIVE  Patient response un:Yzjouy Literacy - How often do you need to have someone help you when you read instructions, pamphlets, or other written material from your doctor or pharmacy?: Rarely  Patient response un:Dnrpjo Isolation - How often do you feel lonely or isolated from those around you?: Never  Services provided included: MD, RD, PT, OT, RN, CM, Pharmacy, Neuropsych, SW  Financial Services:  Field seismologist Utilized: Physiological scientist MEDICARE  Choices offered to/list presented to: PT AND DAUGHTER  Follow-up services arranged:  Home Health, DME, Patient/Family request agency HH/DME Home Health Agency: [facility]  PT  & OT ALF PREFERS THIS AGENCY    DME : [facility]  HOSPITAL BED AND WHEELCHAIR HH/DME Requested Agency: ALF PREFERS  Patient response to transportation need: Is the patient able to respond to transportation needs?: Yes In the past 12 months, has lack of transportation kept you from medical appointments or from getting medications?: No In the past 12 months, has lack of transportation kept you from meetings, work, or from getting things needed for daily living?: No   Patient/Family verbalized understanding of follow-up arrangements:  Yes  Individual responsible for coordination of the follow-up plan: Samuel Mata  [phone]  Confirmed correct DME delivered: Samuel Mata 11/22/2023    Comments (or additional information):PT NEEDS MORE CARE THEN WIFE CAN PROVIDE. THOUGHT TO GO TO ALF TO GET MORE REHAB AND CARE THEN BACK HOME. DAUGHTER TO TRANSPORT TO FACILITY  Summary of Stay    Date/Time Discharge Planning CSW  11/16/23 1003 Family  discussing plan either ALF or SNF. Daughter's concerned needs someone at night due to risk og falling going to the bathroom. Wife could only do supervision level. Continue discussion after team conference what is best for all RGD  11/09/23 0940 Unsure of plan for discharge-family discussing options-ALF and SNF. Will see what family can provide and work on this plan. RGD       Samuel Mata

## 2023-11-22 NOTE — Plan of Care (Signed)
  Problem: RH Toilet Transfers Goal: LTG Patient will perform toilet transfers w/assist (OT) Description: LTG: Patient will perform toilet transfers with assist, with/without cues using equipment (OT) Outcome: Not Met (add Reason) Note: Recommending close SBA-CGA for toilet transfers to increase safety d/t Pt limited by pain and L LE weakness.    Problem: RH Balance Goal: LTG Patient will maintain dynamic standing with ADLs (OT) Description: LTG:  Patient will maintain dynamic standing balance with assist during activities of daily living (OT)  Outcome: Completed/Met   Problem: Sit to Stand Goal: LTG:  Patient will perform sit to stand in prep for activites of daily living with assistance level (OT) Description: LTG:  Patient will perform sit to stand in prep for activites of daily living with assistance level (OT) Outcome: Completed/Met   Problem: RH Grooming Goal: LTG Patient will perform grooming w/assist,cues/equip (OT) Description: LTG: Patient will perform grooming with assist, with/without cues using equipment (OT) Outcome: Completed/Met   Problem: RH Dressing Goal: LTG Patient will perform upper body dressing (OT) Description: LTG Patient will perform upper body dressing with assist, with/without cues (OT). Outcome: Completed/Met   Problem: RH Toileting Goal: LTG Patient will perform toileting task (3/3 steps) with assistance level (OT) Description: LTG: Patient will perform toileting task (3/3 steps) with assistance level (OT)  Outcome: Completed/Met

## 2023-11-22 NOTE — Progress Notes (Signed)
 PROGRESS NOTE   Subjective/Complaints:  Awaiting insurance approval for skilled nursing.  Patient doing well today in good spirits  ROS: as per HPI. Denies CP, SOB, abd pain, N/V/D/C, or any other complaints at this time.    Objective:   No results found.   No results for input(s): WBC, HGB, HCT, PLT in the last 72 hours.  No results for input(s): NA, K, CL, CO2, GLUCOSE, BUN, CREATININE, CALCIUM  in the last 72 hours.     Intake/Output Summary (Last 24 hours) at 11/22/2023 0825 Last data filed at 11/22/2023 0657 Gross per 24 hour  Intake 480 ml  Output 750 ml  Net -270 ml        Physical Exam: Vital Signs Blood pressure (!) 143/69, pulse 66, temperature (!) 97.5 F (36.4 C), temperature source Oral, resp. rate 18, height 5' 7 (1.702 m), weight 57.9 kg, SpO2 94%.   General: No acute distress, upright in bed, eating breakfast. Mood and affect are appropriate Heart: Regular rate and rhythm no rubs murmurs or extra sounds Lungs: Clear to auscultation, breathing unlabored, no rales or wheezes Abdomen: Positive bowel sounds, soft nontender to palpation, nondistended Extremities: No clubbing, cyanosis, or edema  PRIOR EXAMS: Skin: hip incision sites CDI   Pain with AROM Hip flexion but not PROM Neurologic: Cranial nerves II through XII intact, motor strength is 5/5 in bilateral deltoid, bicep, tricep, grip, right hip flexor, knee extensors, ankle dorsiflexor and plantar flexor 3-/5 Left HF, 3-/5 knee ext with reduced pain inhibition 5/5 ADF  Musculoskeletal: Full range of motion in all 4 extremities. No joint swelling     Assessment/Plan: 1. Functional deficits which require 3+ hours per day of interdisciplinary therapy in a comprehensive inpatient rehab setting. Physiatrist is providing close team supervision and 24 hour management of active medical problems listed below. Physiatrist and  rehab team continue to assess barriers to discharge/monitor patient progress toward functional and medical goals  Care Tool:  Bathing    Body parts bathed by patient: Right arm, Left arm, Chest, Abdomen, Front perineal area, Buttocks, Right upper leg, Face, Left upper leg, Right lower leg, Left lower leg   Body parts bathed by helper: Buttocks     Bathing assist Assist Level: Contact Guard/Touching assist     Upper Body Dressing/Undressing Upper body dressing   What is the patient wearing?: Pull over shirt    Upper body assist Assist Level: Supervision/Verbal cueing    Lower Body Dressing/Undressing Lower body dressing      What is the patient wearing?: Pants     Lower body assist Assist for lower body dressing: Moderate Assistance - Patient 50 - 74%     Toileting Toileting    Toileting assist Assist for toileting: Contact Guard/Touching assist     Transfers Chair/bed transfer  Transfers assist     Chair/bed transfer assist level: Contact Guard/Touching assist     Locomotion Ambulation   Ambulation assist      Assist level: Contact Guard/Touching assist Assistive device: Walker-rolling Max distance: 133ft   Walk 10 feet activity   Assist  Walk 10 feet activity did not occur: Safety/medical concerns  Assist level: Supervision/Verbal cueing Assistive  device: Walker-rolling   Walk 50 feet activity   Assist Walk 50 feet with 2 turns activity did not occur: Safety/medical concerns  Assist level: Contact Guard/Touching assist Assistive device: Walker-rolling    Walk 150 feet activity   Assist Walk 150 feet activity did not occur: Safety/medical concerns  Assist level: Minimal Assistance - Patient > 75% Assistive device: Walker-rolling    Walk 10 feet on uneven surface  activity   Assist Walk 10 feet on uneven surfaces activity did not occur: Safety/medical concerns   Assist level: Minimal Assistance - Patient > 75% Assistive  device: Walker-rolling   Wheelchair     Assist Is the patient using a wheelchair?: Yes Type of Wheelchair: Manual Wheelchair activity did not occur: Safety/medical concerns  Wheelchair assist level: Moderate Assistance - Patient 50 - 74%      Wheelchair 50 feet with 2 turns activity    Assist    Wheelchair 50 feet with 2 turns activity did not occur: Safety/medical concerns   Assist Level: Moderate Assistance - Patient 50 - 74%   Wheelchair 150 feet activity     Assist  Wheelchair 150 feet activity did not occur: Safety/medical concerns   Assist Level: Maximal Assistance - Patient 25 - 49%   Blood pressure (!) 143/69, pulse 66, temperature (!) 97.5 F (36.4 C), temperature source Oral, resp. rate 18, height 5' 7 (1.702 m), weight 57.9 kg, SpO2 94%.  Medical Problem List and Plan: 1. Functional deficits secondary to left three-part intertrochanteric hip fracture with subsequent IM nailing 11/01/2023.  Weightbearing as tolerated             -patient may shower with incisions covered -ELOS/Goals: 7/9, supervision PT/OT- discussed ALF plan with pt which I would recommend and pt is agreeable   -Continue CIR, medically stable for discharge 7/9 No PMR f/u needed  -Repeat xrays have good alignment of hardware, reviewed by Ortho PA as well  2.  Antithrombotics: -DVT/anticoagulation:  Mechanical: Antiembolism stockings, thigh (TED hose) Bilateral lower extremities.     Amb 150' with PT yesterday Sup/CGA           -antiplatelet therapy: Plavix  75 mg daily 3. Pain Management: tylenol  and Robaxin  as needed, discontinue IM Toradol  and begin tramadol  50mg  q6h PRN, may d/c robaxin  can use tramadol  more frequently but will reduce to 25mg  given daughter's concern about MS changes  Will try scheduling acetaminophen  but may require addition of tramadol    -6/30- pain if tramadol  not given in am, will schedule 25mg  q6 and monitor for sedation / confusion , well-controlled on current  medications 4. Mood/Behavior/Sleep: Provide emotional support -antipsychotic agents: Seroquel  25 mg nightly started inpatient d/t delirum which later resolved -11/06/23 poor sleep, declined seroquel -- will d/c , daughter wants to avoid sedating meds will start melatonin 3mg  nightly; monitor sleep 6/24 d/c Seroquel  5. Neuropsych/cognition: This patient is not capable of making decisions on his own behalf. 6. Skin/Wound Care: Routine skin checks Per Ortho [facility] PA, sutures out  OV with Dr Celena 2 wk (or 1wk post hospital d/c) 7. Fluids/Electrolytes/Nutrition: Routine in and outs with follow-up chemistries, cont vitamins and supplements.     Latest Ref Rng & Units 11/07/2023    5:06 AM 11/04/2023    3:25 AM 11/03/2023    3:24 AM  BMP  Glucose 70 - 99 mg/dL 888  89  896   BUN 8 - 23 mg/dL 25  19  18    Creatinine 0.61 - 1.24 mg/dL 9.21  0.79  0.99   Sodium 135 - 145 mmol/L 136  137  137   Potassium 3.5 - 5.1 mmol/L 4.2  3.6  3.4   Chloride 98 - 111 mmol/L 105  104  104   CO2 22 - 32 mmol/L 25  26  25    Calcium  8.9 - 10.3 mg/dL 8.5  8.3  8.7     8.  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   Continue Plavix   9.  BPH.  Flomax  0.4 mg daily check PVRs  10.  Hypertension.  Monitor with increased mobility.  Benicar 5mg  daily currently on hold due to some soft blood pressures and resume as needed.   11/09/23 BPs mildly elevated , will see if this goes down with scheduled pain medication  -7/5-6/25 BPs improving and stable, monitor Vitals:   11/18/23 1451 11/18/23 2015 11/19/23 0312 11/19/23 1414  BP: (!) 155/85 129/72 135/66 125/72   11/19/23 2009 11/20/23 0435 11/20/23 1400 11/20/23 1942  BP: 122/69 (!) 150/75 (!) 150/78 (!) 147/64   11/21/23 0415 11/21/23 1400 11/21/23 1927 11/22/23 0537   BP: (!) 140/80 127/79 (!) 150/73 (!) 143/69   11. HLD: Crestor  20 mg daily 12. Urinary incont resolved, no retention  13. Anemia: 11-12 range, monitor labs    Latest Ref Rng & Units 11/07/2023    5:06 AM 11/04/2023    3:25 AM 11/03/2023    3:24 AM  CBC  WBC 4.0 - 10.5 K/uL 9.1  10.6  13.0   Hemoglobin 13.0 - 17.0 g/dL 89.1  88.8  86.5   Hematocrit 39.0 - 52.0 % 33.0  33.9  40.5   Platelets 150 - 400 K/uL 318  242  271     14. H/o hereditary spherocytosis s/p splenectomy 15. COPD: continue nebs/bronchodilators PRN Off O2 sats ok 16. Vit D deficiency, level 8.51 on 6/18, started on 50kU weekly 17. Constipation:   Improved regular lately- monitor for now 18. GI ppx: pepcid  20mg  daily  19.  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

## 2023-11-23 ENCOUNTER — Other Ambulatory Visit (HOSPITAL_COMMUNITY): Payer: Self-pay

## 2023-11-23 NOTE — Progress Notes (Addendum)
 Patient ID: Samuel Mata, male   DOB: [birth year], 89 y.o.   MRN: [id number] [name] medications need to be filled prior to discharging [facility]-PA aware of this.Daughter coming to transport pt to facility  10;30 AM Met with daughter both hospital bed and wheelchair will be delivered to facility since [employer] took too long to deliver to the hospital room at least the wheelchair that is. Daughter will get transport chair from home to take to facility until wheelchair and bed delivered.

## 2023-11-23 NOTE — Plan of Care (Signed)
  Problem: RH Stairs Goal: LTG Patient will ambulate up and down stairs w/assist (PT) Description: LTG: Patient will ambulate up and down # of stairs with assistance (PT) 11/23/2023 0817 by Thaddeus Mliss LABOR, PT Outcome: Progressing 11/23/2023 0814 by Thaddeus Mliss LABOR, PT Flowsheets (Taken 11/23/2023 [phone]) LTG: Pt will ambulate up/down stairs assist needed:: Moderate Assistance - Patient 50 - 74% LTG: Pt will  ambulate up and down number of stairs: 4 steps with BHR   Problem: Sit to Stand Goal: LTG:  Patient will perform sit to stand with assistance level (PT) Description: LTG:  Patient will perform sit to stand with assistance level (PT) 11/23/2023 0817 by Thaddeus Mliss LABOR, PT Outcome: Completed/Met 11/23/2023 0814 by Thaddeus Mliss LABOR, PT Flowsheets (Taken 11/06/2023 1321 by Georgina Alger [name], PT) LTG: PT will perform sit to stand in preparation for functional mobility with assistance level: Supervision/Verbal cueing   Problem: RH Bed Mobility Goal: LTG Patient will perform bed mobility with assist (PT) Description: LTG: Patient will perform bed mobility with assistance, with/without cues (PT). 11/23/2023 0817 by Thaddeus Mliss LABOR, PT Outcome: Completed/Met 11/23/2023 0814 by Thaddeus Mliss LABOR, PT Flowsheets (Taken 11/23/2023 [phone]) LTG: Pt will perform bed mobility with assistance level of: Independent with assistive device    Problem: RH Bed to Chair Transfers Goal: LTG Patient will perform bed/chair transfers w/assist (PT) Description: LTG: Patient will perform bed to chair transfers with assistance (PT). 11/23/2023 0817 by Thaddeus Mliss LABOR, PT Outcome: Completed/Met 11/23/2023 0814 by Thaddeus Mliss LABOR, PT Flowsheets (Taken 11/06/2023 1321 by Georgina Alger [name], PT) LTG: Pt will perform Bed to Chair Transfers with assistance level: Supervision/Verbal cueing   Problem: RH Ambulation Goal: LTG Patient will ambulate in controlled environment (PT) Description: LTG: Patient will ambulate in a controlled environment, # of feet  with assistance (PT). 11/23/2023 0817 by Thaddeus Mliss LABOR, PT Outcome: Completed/Met 11/23/2023 0814 by Thaddeus Mliss LABOR, PT Flowsheets (Taken 11/06/2023 1321 by Georgina Alger [name], PT) LTG: Pt will ambulate in controlled environ  assist needed:: Contact Guard/Touching assist LTG: Ambulation distance in controlled environment: 150 feet

## 2023-11-28 ENCOUNTER — Ambulatory Visit

## 2023-11-28 DIAGNOSIS — I639 Cerebral infarction, unspecified: Secondary | ICD-10-CM | POA: Diagnosis not present

## 2023-11-28 LAB — CUP PACEART REMOTE DEVICE CHECK
Date Time Interrogation Session: 20250712231031
Implantable Pulse Generator Implant Date: 20220901

## 2023-12-07 ENCOUNTER — Ambulatory Visit: Payer: Self-pay | Admitting: Cardiovascular Disease

## 2023-12-08 ENCOUNTER — Emergency Department (HOSPITAL_COMMUNITY)
Admission: EM | Admit: 2023-12-08 | Discharge: 2023-12-08 | Disposition: A | Discharge status: Home / Self Care | Attending: Emergency Medicine | Admitting: Emergency Medicine

## 2023-12-08 ENCOUNTER — Emergency Department (HOSPITAL_COMMUNITY)

## 2023-12-08 ENCOUNTER — Other Ambulatory Visit: Payer: Self-pay

## 2023-12-08 ENCOUNTER — Encounter (HOSPITAL_COMMUNITY): Payer: Self-pay

## 2023-12-08 DIAGNOSIS — S32018A Other fracture of first lumbar vertebra, initial encounter for closed fracture: Secondary | ICD-10-CM | POA: Insufficient documentation

## 2023-12-08 DIAGNOSIS — I10 Essential (primary) hypertension: Secondary | ICD-10-CM | POA: Insufficient documentation

## 2023-12-08 DIAGNOSIS — Y92002 Bathroom of unspecified non-institutional (private) residence single-family (private) house as the place of occurrence of the external cause: Secondary | ICD-10-CM | POA: Insufficient documentation

## 2023-12-08 DIAGNOSIS — Z79899 Other long term (current) drug therapy: Secondary | ICD-10-CM | POA: Diagnosis not present

## 2023-12-08 DIAGNOSIS — Z7902 Long term (current) use of antithrombotics/antiplatelets: Secondary | ICD-10-CM | POA: Insufficient documentation

## 2023-12-08 DIAGNOSIS — S0101XA Laceration without foreign body of scalp, initial encounter: Secondary | ICD-10-CM | POA: Diagnosis not present

## 2023-12-08 DIAGNOSIS — W19XXXA Unspecified fall, initial encounter: Secondary | ICD-10-CM

## 2023-12-08 DIAGNOSIS — S32010A Wedge compression fracture of first lumbar vertebra, initial encounter for closed fracture: Secondary | ICD-10-CM

## 2023-12-08 DIAGNOSIS — Z8673 Personal history of transient ischemic attack (TIA), and cerebral infarction without residual deficits: Secondary | ICD-10-CM | POA: Diagnosis not present

## 2023-12-08 DIAGNOSIS — W1809XA Striking against other object with subsequent fall, initial encounter: Secondary | ICD-10-CM | POA: Diagnosis not present

## 2023-12-08 DIAGNOSIS — M545 Low back pain, unspecified: Secondary | ICD-10-CM | POA: Diagnosis present

## 2023-12-08 LAB — URINALYSIS, W/ REFLEX TO CULTURE (INFECTION SUSPECTED)
Bacteria, UA: NONE SEEN
Bilirubin Urine: NEGATIVE
Glucose, UA: NEGATIVE mg/dL
Hgb urine dipstick: NEGATIVE
Ketones, ur: NEGATIVE mg/dL
Nitrite: NEGATIVE
Protein, ur: NEGATIVE mg/dL
Specific Gravity, Urine: 1.005 (ref 1.005–1.030)
pH: 8 (ref 5.0–8.0)

## 2023-12-08 MED ORDER — LIDOCAINE-EPINEPHRINE (PF) 2 %-1:200000 IJ SOLN
10.0000 mL | Freq: Once | INTRAMUSCULAR | Status: DC
  Filled 2023-12-08: qty 20

## 2023-12-08 NOTE — ED Triage Notes (Signed)
 Pt arrived from [facility] [place] Pt got up to use the bathroom fell backward hit head on furniture, head lac to posterior head. Bleeding is controlled. Denies loc. Denies thinners. GCS 15

## 2023-12-08 NOTE — Discharge Instructions (Signed)
 2 staples were placed in the scalp for a small laceration these will need to be removed in about 7 days.  Can use Tylenol  as needed for any pain.  You have a very small compression fracture at the L1 vertebrae with only 5% height loss.  Return for any new or worsening symptoms otherwise follow-up with primary care provider as planned.

## 2023-12-08 NOTE — ED Provider Notes (Signed)
 [place] EMERGENCY DEPARTMENT AT [facility] Provider Note   CSN: [id number] Arrival date & time: 12/08/23  [id number]     Patient presents with: Head Laceration and Fall   Samuel Mata is a 89 y.o. male.   Samuel Mata is a 89 y.o. male with a history of hypertension, BPH, stroke, and recent hip replacement after mechanical fall, who presents today for evaluation after an unwitnessed fall this morning.  Patient was walking to the bathroom when he lost his balance and fell backwards striking his head on furniture in the floor.  Small laceration with bleeding controlled reported by EMS.  Patient also complains of some mild low back pain.  No pain in his hips.  Denies chest or abdominal pain.  Patient reports not feeling lightheaded or dizzy prior to the fall but he reports since his recent hip surgery he sometimes has some balance issues.  Daughter is at bedside and reports that he has to get up frequently during the night to use the bathroom and does not consistently use his walker and will not ask for help.  Currently stays at an assisted living facility.  The history is provided by the patient, a relative and medical records.  Head Laceration Pertinent negatives include no chest pain, no abdominal pain and no shortness of breath.  Fall Pertinent negatives include no chest pain, no abdominal pain and no shortness of breath.       Prior to Admission medications   Medication Sig Start Date End Date Taking? Authorizing Provider  acetaminophen  (TYLENOL ) 325 MG tablet Take 650 mg by mouth in the morning, at noon, and at bedtime.   Yes [provider]  clopidogrel  (PLAVIX ) 75 MG tablet Take 1 tablet (75 mg total) by mouth daily. 11/22/23  Yes Angiulli, Toribio [name], PA-C  docusate sodium  (COLACE) 100 MG capsule Take 1 capsule (100 mg total) by mouth 2 (two) times daily. 11/22/23  Yes Angiulli, Toribio [name], PA-C  famotidine  (PEPCID ) 20 MG tablet Take 1 tablet (20 mg total) by mouth  daily. 11/22/23  Yes Angiulli, Toribio [name], PA-C  melatonin 3 MG TABS tablet Take 1 tablet (3 mg total) by mouth at bedtime. 11/22/23  Yes Angiulli, Toribio [name], PA-C  methocarbamol  (ROBAXIN ) 500 MG tablet Take 500 mg by mouth every 6 (six) hours as needed for muscle spasms.   Yes [provider]  Multiple Vitamin (MULTIVITAMIN WITH MINERALS) TABS tablet Take 1 tablet by mouth daily. 11/06/23  Yes Rashid, Farhan, MD  polyethylene glycol (MIRALAX  / GLYCOLAX ) 17 g packet Take 17 g by mouth daily. 11/22/23  Yes Angiulli, Toribio [name], PA-C  rosuvastatin  (CRESTOR ) 20 MG tablet Take 1 tablet (20 mg total) by mouth daily. 11/22/23  Yes Angiulli, Toribio [name], PA-C  tamsulosin  (FLOMAX ) 0.4 MG CAPS capsule Take 1 capsule (0.4 mg total) by mouth daily after supper. Patient taking differently: Take 0.4 mg by mouth at bedtime. 11/22/23  Yes Angiulli, Toribio [name], PA-C  traMADol  (ULTRAM ) 50 MG tablet Take 1 tablet (50 mg total) by mouth every 6 (six) hours as needed. Patient taking differently: Take 25 mg by mouth every 6 (six) hours as needed for moderate pain (pain score 4-6). 11/22/23 11/21/24 Yes Angiulli, Toribio [name], PA-C  Vitamin D , Ergocalciferol , (DRISDOL ) 1.25 MG (50000 UNIT) CAPS capsule Take 1 capsule (50,000 Units total) by mouth every 7 (seven) days. 11/22/23  Yes Angiulli, Toribio [name], PA-C    Allergies: Codeine    Review of Systems  Constitutional:  Negative  for chills and fever.  Eyes:  Negative for visual disturbance.  Respiratory:  Negative for cough and shortness of breath.   Cardiovascular:  Negative for chest pain.  Gastrointestinal:  Negative for abdominal pain, nausea and vomiting.  Musculoskeletal:  Positive for back pain.    Updated Vital Signs BP (!) 163/94   Pulse 80   Temp 98.1 F (36.7 C) (Oral)   Resp 16   Ht 5' 7 (1.702 m)   Wt 57 kg   SpO2 100%   BMI 19.68 kg/m   Physical Exam Vitals and nursing note reviewed.  Constitutional:      General: He is not in acute distress.    Appearance:  Normal appearance. He is well-developed. He is not diaphoretic.  HENT:     Head: Normocephalic and atraumatic.     Comments:  1.5 cm laceration to the back of the head with small surrounding hematoma, no step-off or deformity noted, negative Battle sign Eyes:     General:        Right eye: No discharge.        Left eye: No discharge.     Pupils: Pupils are equal, round, and reactive to light.  Neck:     Comments: No midline C-spine tenderness, no step-off or deformity Cardiovascular:     Rate and Rhythm: Normal rate and regular rhythm.     Pulses: Normal pulses.     Heart sounds: Normal heart sounds.  Pulmonary:     Effort: Pulmonary effort is normal. No respiratory distress.     Breath sounds: Normal breath sounds. No wheezing or rales.     Comments: Respirations equal and unlabored, patient able to speak in full sentences, lungs clear to auscultation bilaterally no chest wall tenderness Abdominal:     General: Bowel sounds are normal. There is no distension.     Palpations: Abdomen is soft. There is no mass.     Tenderness: There is no abdominal tenderness. There is no guarding.     Comments: Abdomen soft, nondistended, nontender to palpation in all quadrants without guarding or peritoneal signs  Musculoskeletal:        General: No deformity.     Cervical back: Neck supple.     Comments: Mild low back tenderness without focal step-off or deformity, no midline thoracic spine tenderness.  All joints supple and easily movable, all compartments soft, no tenderness over the hips or pelvis.  Skin:    General: Skin is warm and dry.     Capillary Refill: Capillary refill takes less than 2 seconds.  Neurological:     Mental Status: He is alert and oriented to person, place, and time.     Coordination: Coordination normal.     Comments: Speech is clear, able to follow commands CN III-XII intact Normal strength in upper and lower extremities bilaterally including dorsiflexion and plantar  flexion, strong and equal grip strength Sensation normal to light and sharp touch Moves extremities without ataxia, coordination intact  Psychiatric:        Mood and Affect: Mood normal.        Behavior: Behavior normal.     (all labs ordered are listed, but only abnormal results are displayed) Labs Reviewed  URINALYSIS, W/ REFLEX TO CULTURE (INFECTION SUSPECTED) - Abnormal; Notable for the following components:      Result Value   Color, Urine STRAW (*)    Leukocytes,Ua TRACE (*)    All other components within normal limits  EKG: None  Radiology: DG Pelvis Portable Result Date: 12/08/2023 CLINICAL DATA:  fall EXAM: PORTABLE PELVIS 1-2 VIEWS COMPARISON:  October 31, 2023 FINDINGS: Diffuse osteopenia. Redemonstrated left intertrochanteric fracture status post intramedullary nail placement. Avulsion fracture of the lesser trochanter also noted. Overall, this appears to be healing.No evidence of pelvic fracture or diastasis.No acute hip fracture or dislocation. Soft tissues are unremarkable. IMPRESSION: No acute fracture, pelvic bone diastasis, or dislocation. Electronically Signed   By: Rogelia Myers M.D.   On: 12/08/2023 08:20   CT Head Wo Contrast Result Date: 12/08/2023 CLINICAL DATA:  Head, neck and back trauma. EXAM: CT HEAD WITHOUT CONTRAST CT CERVICAL SPINE WITHOUT CONTRAST CT LUMBAR SPINE WITHOUT CONTRAST TECHNIQUE: Multidetector CT imaging of the head, cervical and lumbar spine was performed following the standard protocol without intravenous contrast. Multiplanar CT image reconstructions of the cervical spine were also generated. RADIATION DOSE REDUCTION: This exam was performed according to the departmental dose-optimization program which includes automated exposure control, adjustment of the mA and/or kV according to patient size and/or use of iterative reconstruction technique. COMPARISON:  Head CT 10/31/2023, cervical spine CT 10/31/2023, and MRI lumbar spine 08/30/2021.  FINDINGS: CT HEAD FINDINGS Brain: Chronic infarcts are again noted in the left cerebellar hemisphere, right thalamocapsular area and seen previously. There is a lacunar infarct in the right frontal deep white matter which was also previously seen. There is mild-to-moderate cerebral atrophy, small-vessel disease and atrophic ventriculomegaly with mild cerebellar atrophy. No cortical based acute infarct, hemorrhage, mass or mass effect is seen. There are coarse dural calcifications in the frontal falx. There is no midline shift.  Basal cisterns are clear. Vascular: There are patchy calcifications in the carotid siphons. No hyperdense central vessels are seen. Skull: Negative for fractures or focal lesions. There may be a left parietal scalp contusion but no scalp hematoma. Sinuses/Orbits: No acute finding. Other: None. CT CERVICAL SPINE FINDINGS Alignment: Trace chronic degenerative anterolisthesis C4-5. Otherwise normal alignment. Skull base and vertebrae: No fracture or focal pathologic process is seen. Osteopenia. 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 Soft tissues and spinal canal: No prevertebral fluid or swelling. No visible canal hematoma. Disc levels: Mild disc space loss C4-5. Advanced disc space loss C5-6 through C7-T1. There are small bidirectional endplate spurs but no high-grade disc osteophyte complex encroachment on the thecal sac or herniated discs. No cord compromise is seen. There are mild nonstenosing posterior disc bulges at C2-3 and C3-4. There are facet spurs at most levels but no significant acquired foraminal stenosis. Upper chest: Biapical pleural-parenchymal scarring. Other: None. CT LUMBAR SPINE WITHOUT CONTRAST FINDINGS Segmentation: Standard. Alignment: There is slight levoscoliosis, apex at L3-4. There is a chronic grade 1 L4-5 degenerative anterolisthesis, minimal grade 1 anterolisthesis at L3-4 also degenerative. No further or new alignment abnormality is  seen. Vertebrae: There is a mild acute compression fracture of the superior endplate of L1, with no visible involvement of the posterior elements and pedicles. Loss of the vertebral height is no more than about 5% anteriorly and posteriorly and 10% centrally, with slight buckling along the anterior cortex and with a subtle transverse fracture line extending through the upper third of the vertebral body. There is no paraspinal hematoma. I do not see further acute fractures. There is a chronic moderate compression fracture of T12 with 60% anterior and central height loss, 30% posterior height loss, and chronic 4-5 mm posterosuperior retropulsion without evidence of interval kyphoplasty cement augmentation. The central and anterior height  loss are mildly progressed from 2023 when the fracture was more acute. There is new demonstration of anterior bridging osteophytes up to T11 from T12. There is spinous process abutment L2-5 with opposing surface spurring and sclerosis, findings consistent with Baastrup's disease. Lumbar spondylosis is again noted and similar to 2023. There is diffuse osteopenia. No focal pathologic lesion is seen. There are endplate Schmorl's nodes from L2-3 down, seen previously, and reactive discogenic endplate sclerosis at L5-S1. Paraspinal soft tissues: No mass or hematoma. No aortic aneurysm. Aortic atherosclerosis. There are nonobstructive caliceal stones within the left kidney. No hydronephrosis bilaterally. There are bilateral Bosniak 1 renal cysts, only partially visible on the right. No adrenal mass. Disc levels: Similar to 2023 there is mild disc space loss at L2-3, moderate disc space loss at L4-5 with vacuum phenomenon, and chronic L5-S1 disc collapse with vacuum phenomenon. The discs are maintained in height. There is a mild encroachment on the thecal sac due to the posterosuperior T12 retropulsion but no frank cord compression. No lumbar level demonstrates high-grade spinal stenosis.  There is a moderate spinal stenosis at L4-5 due to posterior disc osteophyte complex, dorsal ligamentous thickening and moderate facet hypertrophy, but no more than previously. The subarticular zones are partially effaced. No significant disc bulge is seen above L2-3. There are mild posterior disc bulges at L2-3 L3-4, with mild effacement of the subarticular zones. At L5-S1, posterior disc osteophyte complex flattens the ventral sheaths of the S1 nerve roots but does not cause spinal stenosis. No levels demonstrate high-grade foraminal stenosis. There is mild right foraminal stenosis at L3-4, mild-to-moderate bilateral foraminal stenosis at L4-5 and mild foraminal stenosis at L5-S1. Other: Anterior bridging osteophytes right SI joint greater than left. IMPRESSION: 1. No acute intracranial CT findings or depressed skull fractures. Possible left parietal scalp contusion. 2. Atrophy, small-vessel disease and chronic infarcts. 3. Osteopenia and degenerative change of the cervical spine without evidence of fractures. 4. Mild acute compression fracture of the superior endplate of L1 with no visible involvement of the posterior elements and pedicles, and with loss of the vertebral height no more than about 5% anteriorly and posteriorly and 10% centrally. 5. Chronic moderate compression fracture of T12 with 60% anterior and central height loss, 30% posterior height loss, and chronic 4-5 mm posterosuperior retropulsion. The central and anterior height loss are mildly progressed from 2023 when the fracture was more acute. 6. Aortic atherosclerosis. 7. Nonobstructive left nephrolithiasis. 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 Aortic Atherosclerosis (ICD10-I70.0). Electronically Signed   By: Francis Quam M.D.   On: 12/08/2023 07:57   CT Cervical Spine Wo Contrast Result Date: 12/08/2023 CLINICAL DATA:  Head, neck and back trauma. EXAM: CT HEAD WITHOUT CONTRAST CT CERVICAL SPINE WITHOUT CONTRAST CT LUMBAR SPINE WITHOUT CONTRAST  TECHNIQUE: Multidetector CT imaging of the head, cervical and lumbar spine was performed following the standard protocol without intravenous contrast. Multiplanar CT image reconstructions of the cervical spine were also generated. RADIATION DOSE REDUCTION: This exam was performed according to the departmental dose-optimization program which includes automated exposure control, adjustment of the mA and/or kV according to patient size and/or use of iterative reconstruction technique. COMPARISON:  Head CT 10/31/2023, cervical spine CT 10/31/2023, and MRI lumbar spine 08/30/2021. FINDINGS: CT HEAD FINDINGS Brain: Chronic infarcts are again noted in the left cerebellar hemisphere, right thalamocapsular area and seen previously. There is a lacunar infarct in the right frontal deep white matter which was also previously seen. There is mild-to-moderate cerebral atrophy, small-vessel disease and atrophic ventriculomegaly  with mild cerebellar atrophy. No cortical based acute infarct, hemorrhage, mass or mass effect is seen. There are coarse dural calcifications in the frontal falx. There is no midline shift.  Basal cisterns are clear. Vascular: There are patchy calcifications in the carotid siphons. No hyperdense central vessels are seen. Skull: Negative for fractures or focal lesions. There may be a left parietal scalp contusion but no scalp hematoma. Sinuses/Orbits: No acute finding. Other: None. CT CERVICAL SPINE FINDINGS Alignment: Trace chronic degenerative anterolisthesis C4-5. Otherwise normal alignment. Skull base and vertebrae: No fracture or focal pathologic process is seen. Osteopenia. 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 Soft tissues and spinal canal: No prevertebral fluid or swelling. No visible canal hematoma. Disc levels: Mild disc space loss C4-5. Advanced disc space loss C5-6 through C7-T1. There are small bidirectional endplate spurs but no high-grade disc osteophyte complex  encroachment on the thecal sac or herniated discs. No cord compromise is seen. There are mild nonstenosing posterior disc bulges at C2-3 and C3-4. There are facet spurs at most levels but no significant acquired foraminal stenosis. Upper chest: Biapical pleural-parenchymal scarring. Other: None. CT LUMBAR SPINE WITHOUT CONTRAST FINDINGS Segmentation: Standard. Alignment: There is slight levoscoliosis, apex at L3-4. There is a chronic grade 1 L4-5 degenerative anterolisthesis, minimal grade 1 anterolisthesis at L3-4 also degenerative. No further or new alignment abnormality is seen. Vertebrae: There is a mild acute compression fracture of the superior endplate of L1, with no visible involvement of the posterior elements and pedicles. Loss of the vertebral height is no more than about 5% anteriorly and posteriorly and 10% centrally, with slight buckling along the anterior cortex and with a subtle transverse fracture line extending through the upper third of the vertebral body. There is no paraspinal hematoma. I do not see further acute fractures. There is a chronic moderate compression fracture of T12 with 60% anterior and central height loss, 30% posterior height loss, and chronic 4-5 mm posterosuperior retropulsion without evidence of interval kyphoplasty cement augmentation. The central and anterior height loss are mildly progressed from 2023 when the fracture was more acute. There is new demonstration of anterior bridging osteophytes up to T11 from T12. There is spinous process abutment L2-5 with opposing surface spurring and sclerosis, findings consistent with Baastrup's disease. Lumbar spondylosis is again noted and similar to 2023. There is diffuse osteopenia. No focal pathologic lesion is seen. There are endplate Schmorl's nodes from L2-3 down, seen previously, and reactive discogenic endplate sclerosis at L5-S1. Paraspinal soft tissues: No mass or hematoma. No aortic aneurysm. Aortic atherosclerosis. There  are nonobstructive caliceal stones within the left kidney. No hydronephrosis bilaterally. There are bilateral Bosniak 1 renal cysts, only partially visible on the right. No adrenal mass. Disc levels: Similar to 2023 there is mild disc space loss at L2-3, moderate disc space loss at L4-5 with vacuum phenomenon, and chronic L5-S1 disc collapse with vacuum phenomenon. The discs are maintained in height. There is a mild encroachment on the thecal sac due to the posterosuperior T12 retropulsion but no frank cord compression. No lumbar level demonstrates high-grade spinal stenosis. There is a moderate spinal stenosis at L4-5 due to posterior disc osteophyte complex, dorsal ligamentous thickening and moderate facet hypertrophy, but no more than previously. The subarticular zones are partially effaced. No significant disc bulge is seen above L2-3. There are mild posterior disc bulges at L2-3 L3-4, with mild effacement of the subarticular zones. At L5-S1, posterior disc osteophyte complex flattens the ventral sheaths  of the S1 nerve roots but does not cause spinal stenosis. No levels demonstrate high-grade foraminal stenosis. There is mild right foraminal stenosis at L3-4, mild-to-moderate bilateral foraminal stenosis at L4-5 and mild foraminal stenosis at L5-S1. Other: Anterior bridging osteophytes right SI joint greater than left. IMPRESSION: 1. No acute intracranial CT findings or depressed skull fractures. Possible left parietal scalp contusion. 2. Atrophy, small-vessel disease and chronic infarcts. 3. Osteopenia and degenerative change of the cervical spine without evidence of fractures. 4. Mild acute compression fracture of the superior endplate of L1 with no visible involvement of the posterior elements and pedicles, and with loss of the vertebral height no more than about 5% anteriorly and posteriorly and 10% centrally. 5. Chronic moderate compression fracture of T12 with 60% anterior and central height loss, 30%  posterior height loss, and chronic 4-5 mm posterosuperior retropulsion. The central and anterior height loss are mildly progressed from 2023 when the fracture was more acute. 6. Aortic atherosclerosis. 7. Nonobstructive left nephrolithiasis. 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 Aortic Atherosclerosis (ICD10-I70.0). Electronically Signed   By: Francis Quam M.D.   On: 12/08/2023 07:57   CT Lumbar Spine Wo Contrast Result Date: 12/08/2023 CLINICAL DATA:  Head, neck and back trauma. EXAM: CT HEAD WITHOUT CONTRAST CT CERVICAL SPINE WITHOUT CONTRAST CT LUMBAR SPINE WITHOUT CONTRAST TECHNIQUE: Multidetector CT imaging of the head, cervical and lumbar spine was performed following the standard protocol without intravenous contrast. Multiplanar CT image reconstructions of the cervical spine were also generated. RADIATION DOSE REDUCTION: This exam was performed according to the departmental dose-optimization program which includes automated exposure control, adjustment of the mA and/or kV according to patient size and/or use of iterative reconstruction technique. COMPARISON:  Head CT 10/31/2023, cervical spine CT 10/31/2023, and MRI lumbar spine 08/30/2021. FINDINGS: CT HEAD FINDINGS Brain: Chronic infarcts are again noted in the left cerebellar hemisphere, right thalamocapsular area and seen previously. There is a lacunar infarct in the right frontal deep white matter which was also previously seen. There is mild-to-moderate cerebral atrophy, small-vessel disease and atrophic ventriculomegaly with mild cerebellar atrophy. No cortical based acute infarct, hemorrhage, mass or mass effect is seen. There are coarse dural calcifications in the frontal falx. There is no midline shift.  Basal cisterns are clear. Vascular: There are patchy calcifications in the carotid siphons. No hyperdense central vessels are seen. Skull: Negative for fractures or focal lesions. There may be a left parietal scalp contusion but no scalp hematoma.  Sinuses/Orbits: No acute finding. Other: None. CT CERVICAL SPINE FINDINGS Alignment: Trace chronic degenerative anterolisthesis C4-5. Otherwise normal alignment. Skull base and vertebrae: No fracture or focal pathologic process is seen. Osteopenia. 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 Soft tissues and spinal canal: No prevertebral fluid or swelling. No visible canal hematoma. Disc levels: Mild disc space loss C4-5. Advanced disc space loss C5-6 through C7-T1. There are small bidirectional endplate spurs but no high-grade disc osteophyte complex encroachment on the thecal sac or herniated discs. No cord compromise is seen. There are mild nonstenosing posterior disc bulges at C2-3 and C3-4. There are facet spurs at most levels but no significant acquired foraminal stenosis. Upper chest: Biapical pleural-parenchymal scarring. Other: None. CT LUMBAR SPINE WITHOUT CONTRAST FINDINGS Segmentation: Standard. Alignment: There is slight levoscoliosis, apex at L3-4. There is a chronic grade 1 L4-5 degenerative anterolisthesis, minimal grade 1 anterolisthesis at L3-4 also degenerative. No further or new alignment abnormality is seen. Vertebrae: There is a mild acute compression fracture of the  superior endplate of L1, with no visible involvement of the posterior elements and pedicles. Loss of the vertebral height is no more than about 5% anteriorly and posteriorly and 10% centrally, with slight buckling along the anterior cortex and with a subtle transverse fracture line extending through the upper third of the vertebral body. There is no paraspinal hematoma. I do not see further acute fractures. There is a chronic moderate compression fracture of T12 with 60% anterior and central height loss, 30% posterior height loss, and chronic 4-5 mm posterosuperior retropulsion without evidence of interval kyphoplasty cement augmentation. The central and anterior height loss are mildly progressed from 2023  when the fracture was more acute. There is new demonstration of anterior bridging osteophytes up to T11 from T12. There is spinous process abutment L2-5 with opposing surface spurring and sclerosis, findings consistent with Baastrup's disease. Lumbar spondylosis is again noted and similar to 2023. There is diffuse osteopenia. No focal pathologic lesion is seen. There are endplate Schmorl's nodes from L2-3 down, seen previously, and reactive discogenic endplate sclerosis at L5-S1. Paraspinal soft tissues: No mass or hematoma. No aortic aneurysm. Aortic atherosclerosis. There are nonobstructive caliceal stones within the left kidney. No hydronephrosis bilaterally. There are bilateral Bosniak 1 renal cysts, only partially visible on the right. No adrenal mass. Disc levels: Similar to 2023 there is mild disc space loss at L2-3, moderate disc space loss at L4-5 with vacuum phenomenon, and chronic L5-S1 disc collapse with vacuum phenomenon. The discs are maintained in height. There is a mild encroachment on the thecal sac due to the posterosuperior T12 retropulsion but no frank cord compression. No lumbar level demonstrates high-grade spinal stenosis. There is a moderate spinal stenosis at L4-5 due to posterior disc osteophyte complex, dorsal ligamentous thickening and moderate facet hypertrophy, but no more than previously. The subarticular zones are partially effaced. No significant disc bulge is seen above L2-3. There are mild posterior disc bulges at L2-3 L3-4, with mild effacement of the subarticular zones. At L5-S1, posterior disc osteophyte complex flattens the ventral sheaths of the S1 nerve roots but does not cause spinal stenosis. No levels demonstrate high-grade foraminal stenosis. There is mild right foraminal stenosis at L3-4, mild-to-moderate bilateral foraminal stenosis at L4-5 and mild foraminal stenosis at L5-S1. Other: Anterior bridging osteophytes right SI joint greater than left. IMPRESSION: 1. No  acute intracranial CT findings or depressed skull fractures. Possible left parietal scalp contusion. 2. Atrophy, small-vessel disease and chronic infarcts. 3. Osteopenia and degenerative change of the cervical spine without evidence of fractures. 4. Mild acute compression fracture of the superior endplate of L1 with no visible involvement of the posterior elements and pedicles, and with loss of the vertebral height no more than about 5% anteriorly and posteriorly and 10% centrally. 5. Chronic moderate compression fracture of T12 with 60% anterior and central height loss, 30% posterior height loss, and chronic 4-5 mm posterosuperior retropulsion. The central and anterior height loss are mildly progressed from 2023 when the fracture was more acute. 6. Aortic atherosclerosis. 7. Nonobstructive left nephrolithiasis. 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 Aortic Atherosclerosis (ICD10-I70.0). Electronically Signed   By: Francis Quam M.D.   On: 12/08/2023 07:57     Procedures   Medications Ordered in the ED  lidocaine -EPINEPHrine  (XYLOCAINE  W/EPI) 2 %-1:200000 (PF) injection 10 mL (has no administration in time range)  Medical Decision Making  89 year old male presents with mechanical fall, small laceration and hematoma to the back of the head and some mild low back tenderness but otherwise no obvious traumatic injury.  On Plavix  but no other blood thinners.  Alert and oriented with no focal neurologic deficits.  CTs of the head, cervical and lumbar spine ordered as well as a plain film of the pelvis.  Imaging viewed and interpreted independently, left hip fracture s/p intramedullary nail, no new fracture or dislocation noted, head CT without skull fracture or intracranial bleeding, CT of the cervical spine without acute fracture or malalignment.  L1 with very mild compression fracture.  Radiologist estimates only 5%.  I discussed these imaging results with patient and daughter  at bedside, had shared decision-making discussion regarding TLSO brace, daughter feels that patient will not tolerate wearing this brace and he is having minimal back discomfort, given the mild nature of this compression fracture I feel this is very reasonable.  Scalp laceration repaired with 2 staples.  Discussed wound care and staple removal in 7 days.  Patient did have some frequent urination while in the ED today, urinalysis collected with no signs of underlying UTI, patient does have underlying history of BPH that is likely the cause of urinary frequency.  At this time there does not appear to be any evidence of an acute emergency medical condition requiring further emergent evaluation and the patient appears stable for discharge with appropriate outpatient follow up. Diagnosis and return precautions discussed with patient who verbalizes understanding and is agreeable to discharge.        Final diagnoses:  Fall, initial encounter  Laceration of scalp, initial encounter  Closed compression fracture of body of L1 vertebra [facility])    ED Discharge Orders     None          Alva Larraine FALCON, PA-C 12/08/23 1324    Emil Share, DO 12/08/23 1458

## 2023-12-08 NOTE — ED Notes (Signed)
 Patient transported to CT

## 2023-12-11 ENCOUNTER — Emergency Department (EMERGENCY_DEPARTMENT_HOSPITAL)
Admit: 2023-12-11 | Discharge: 2023-12-11 | Disposition: A | Discharge status: Home / Self Care | Attending: Emergency Medicine | Admitting: Emergency Medicine

## 2023-12-11 ENCOUNTER — Emergency Department (HOSPITAL_COMMUNITY)

## 2023-12-11 ENCOUNTER — Emergency Department (HOSPITAL_COMMUNITY)
Admission: EM | Admit: 2023-12-11 | Discharge: 2023-12-11 | Disposition: A | Discharge status: Home / Self Care | Attending: Emergency Medicine | Admitting: Emergency Medicine

## 2023-12-11 ENCOUNTER — Encounter (HOSPITAL_COMMUNITY): Payer: Self-pay

## 2023-12-11 DIAGNOSIS — M25552 Pain in left hip: Secondary | ICD-10-CM | POA: Diagnosis present

## 2023-12-11 DIAGNOSIS — M79662 Pain in left lower leg: Secondary | ICD-10-CM | POA: Diagnosis not present

## 2023-12-11 DIAGNOSIS — Z8673 Personal history of transient ischemic attack (TIA), and cerebral infarction without residual deficits: Secondary | ICD-10-CM | POA: Insufficient documentation

## 2023-12-11 DIAGNOSIS — W19XXXA Unspecified fall, initial encounter: Secondary | ICD-10-CM | POA: Insufficient documentation

## 2023-12-11 DIAGNOSIS — S72122A Displaced fracture of lesser trochanter of left femur, initial encounter for closed fracture: Secondary | ICD-10-CM | POA: Insufficient documentation

## 2023-12-11 DIAGNOSIS — Z87891 Personal history of nicotine dependence: Secondary | ICD-10-CM | POA: Diagnosis not present

## 2023-12-11 DIAGNOSIS — I10 Essential (primary) hypertension: Secondary | ICD-10-CM | POA: Insufficient documentation

## 2023-12-11 LAB — CBC WITH DIFFERENTIAL/PLATELET
Abs Immature Granulocytes: 0.03 K/uL (ref 0.00–0.07)
Basophils Absolute: 0 K/uL (ref 0.0–0.1)
Basophils Relative: 0 %
Eosinophils Absolute: 0 K/uL (ref 0.0–0.5)
Eosinophils Relative: 0 %
HCT: 41.2 % (ref 39.0–52.0)
Hemoglobin: 13.5 g/dL (ref 13.0–17.0)
Immature Granulocytes: 0 %
Lymphocytes Relative: 8 %
Lymphs Abs: 0.9 K/uL (ref 0.7–4.0)
MCH: 31.6 pg (ref 26.0–34.0)
MCHC: 32.8 g/dL (ref 30.0–36.0)
MCV: 96.5 fL (ref 80.0–100.0)
Monocytes Absolute: 2 K/uL — ABNORMAL HIGH (ref 0.1–1.0)
Monocytes Relative: 18 %
Neutro Abs: 8.4 K/uL — ABNORMAL HIGH (ref 1.7–7.7)
Neutrophils Relative %: 74 %
Platelets: 409 K/uL — ABNORMAL HIGH (ref 150–400)
RBC: 4.27 MIL/uL (ref 4.22–5.81)
RDW: 13.1 % (ref 11.5–15.5)
WBC: 11.5 K/uL — ABNORMAL HIGH (ref 4.0–10.5)
nRBC: 0 % (ref 0.0–0.2)

## 2023-12-11 LAB — BASIC METABOLIC PANEL WITH GFR
Anion gap: 8 (ref 5–15)
BUN: 13 mg/dL (ref 8–23)
CO2: 27 mmol/L (ref 22–32)
Calcium: 9.1 mg/dL (ref 8.9–10.3)
Chloride: 101 mmol/L (ref 98–111)
Creatinine, Ser: 0.69 mg/dL (ref 0.61–1.24)
GFR, Estimated: >60 mL/min (ref >60)
Glucose, Bld: 105 mg/dL — ABNORMAL HIGH (ref 70–99)
Potassium: 3.7 mmol/L (ref 3.5–5.1)
Sodium: 136 mmol/L (ref 135–145)

## 2023-12-11 LAB — RESP PANEL BY RT-PCR (RSV, FLU A&B, COVID)  RVPGX2
Influenza A by PCR: NEGATIVE
Influenza B by PCR: NEGATIVE
Resp Syncytial Virus by PCR: NEGATIVE
SARS Coronavirus 2 by RT PCR: NEGATIVE

## 2023-12-11 NOTE — ED Notes (Signed)
 [facility] called

## 2023-12-11 NOTE — Discharge Instructions (Addendum)
 While you were in the emergency room, you had a CT scan of your pelvis.  It showed a small fracture of your lesser trochanter of your femur on the same side that you had your surgery.  There is nothing to do about this, it should heal on its own.  It also showed that the nail that they used in your surgery had gotten very close to your hip joint.  Sometimes this can cause pain and discomfort.  You should follow-up with your orthopedic surgeon Dr. Celena within 1 to 2 weeks.  Your blood work that was done was normal.  Continue taking all medications as prescribed.  Follow-up with your primary care doctor this week.

## 2023-12-11 NOTE — ED Provider Notes (Signed)
 [place] EMERGENCY DEPARTMENT AT [facility] Provider Note  CSN: [id number] Arrival date & time: 12/11/23 9079  Chief Complaint(s) Hip Pain  HPI Samuel Mata is a 89 y.o. male who is here today for hip pain.  Patient had a fall 4 days ago, had imaging done at that time was negative.  He continues have pain on his left hip.  Patient had surgery 1 month ago after a fall.  He is also endorsing sore throat since being in the emergency room.   Past Medical History Past Medical History:  Diagnosis Date   Bronchiectasis without complication (HCC)    Enlarged prostate    Hearing loss    History of kidney stones    Hypertension    Pulmonary nocardiosis (HCC) 2021   treated with 6 month antibiotic course   Stroke (HCC) 2022   left frontoparietal   TIA (transient ischemic attack) 2019   Patient Active Problem List   Diagnosis Date Noted   Coping style affecting medical condition 11/11/2023   Intertrochanteric fracture of left hip (HCC) 11/05/2023   Malnutrition of moderate degree 11/01/2023   Fall at home, initial encounter 10/31/2023   Hyponatremia 04/25/2023   Right thalamic stroke (HCC) 04/12/2023   Acute CVA (cerebrovascular accident) (HCC) 04/08/2023   Acute ischemic stroke [facility])    Carotid dissection, bilateral (HCC)    Aphasia    Immunization due 05/20/2020   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   Medication monitoring encounter 03/07/2020   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   Bronchiectasis without complication (HCC) 11/22/2019   Hereditary spherocytosis (HCC) 08/29/2019   Seborrheic keratoses 08/29/2019   OSA (obstructive sleep apnea) 03/19/2019   History of TIA (transient ischemic attack) 01/23/2018   Hemoptysis 07/15/2017   Pulmonary infiltrates 07/15/2017   History of tobacco use 06/06/2017   Essential hypertension 09/22/2015   Gastroesophageal reflux disease without esophagitis 06/05/2014   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 Authorizing Provider  acetaminophen  (TYLENOL ) 325 MG tablet Take 650 mg by mouth in the morning, at noon, and at bedtime.    [provider]  clopidogrel  (PLAVIX ) 75 MG tablet Take 1 tablet (75 mg total) by mouth daily. 11/22/23   Angiulli, Toribio [name], PA-C  docusate sodium  (COLACE) 100 MG capsule Take 1 capsule (100 mg total) by mouth 2 (two) times daily. 11/22/23   Angiulli, Toribio [name], PA-C  famotidine  (PEPCID ) 20 MG tablet Take 1 tablet (20 mg total) by mouth daily. 11/22/23   Angiulli, Daniel J, PA-C  melatonin 3 MG TABS tablet Take 1 tablet (3 mg total) by mouth at bedtime. 11/22/23   Angiulli, Toribio [name], PA-C  methocarbamol  (ROBAXIN ) 500 MG tablet Take 500 mg by mouth every 6 (six) hours as needed for muscle spasms.    [provider]  Multiple Vitamin (MULTIVITAMIN WITH MINERALS) TABS tablet Take 1 tablet by mouth daily. 11/06/23   Rashid, Farhan, MD  polyethylene glycol (MIRALAX  / GLYCOLAX ) 17 g packet Take 17 g by mouth daily. 11/22/23   Angiulli, Toribio [name], PA-C  rosuvastatin  (CRESTOR ) 20 MG tablet Take 1 tablet (20 mg total) by mouth daily. 11/22/23   Angiulli, Toribio [name], PA-C  tamsulosin  (FLOMAX ) 0.4 MG CAPS capsule Take 1 capsule (0.4 mg total) by mouth daily after supper. Patient taking differently: Take 0.4 mg by  mouth at bedtime. 11/22/23   Angiulli, Toribio [name], PA-C  traMADol  (ULTRAM ) 50 MG tablet Take 1 tablet (50 mg total) by mouth every 6 (six) hours as needed. Patient taking differently: Take 25 mg by mouth every 6 (six) hours as needed for moderate pain (pain score 4-6). 11/22/23 11/21/24  Angiulli, Toribio [name], PA-C  Vitamin D , Ergocalciferol , (DRISDOL ) 1.25 MG (50000 UNIT) CAPS capsule Take 1 capsule (50,000 Units total) by mouth every 7 (seven) days. 11/22/23   Angiulli, Toribio [name], PA-C                                                                                                                                     Past Surgical History Past Surgical History:  Procedure Laterality Date   APPENDECTOMY     CHOLECYSTECTOMY     INTRAMEDULLARY (IM) NAIL INTERTROCHANTERIC Left 11/01/2023   Procedure: FIXATION, FRACTURE, INTERTROCHANTERIC, WITH INTRAMEDULLARY ROD;  Surgeon: Celena Sharper, MD;  Location: [facility] OR;  Service: Orthopedics;  Laterality: Left;   LOOP RECORDER INSERTION N/A 01/15/2021   Procedure: LOOP RECORDER INSERTION;  Surgeon: Kelsie Agent, MD;  Location: [facility] INVASIVE CV LAB;  Service: Cardiovascular;  Laterality: N/A;   NOSE SURGERY     SPINE SURGERY     compressoin fx T12, kyphoplasty   SPLENECTOMY, TOTAL     Family History Family History  Problem Relation Age of Onset   Lung disease Neg Hx     Social History Social History   Tobacco Use   Smoking status: Former    Current packs/day: 0.00    Average packs/day: 1 pack/day for 30.0 years (30.0 ttl pk-yrs)    Types: Cigarettes    Start date: 10/08/1954    Quit date: 10/07/1984    Years since quitting: 39.2   Smokeless tobacco: Never  Substance Use Topics   Alcohol  use: Never   Drug use: Never   Allergies Codeine  Review of Systems Review of Systems  Physical Exam Vital Signs  I have reviewed the triage vital signs BP (!) 174/77 (BP Location: Left Arm)   Pulse 81   Temp 97.8 F (36.6 C) (Oral)   Resp 20   SpO2 92%   Physical Exam Vitals and nursing note reviewed.  HENT:     Head: Normocephalic and atraumatic.     Ears:     Comments: Patient very hard of hearing Eyes:     Pupils: Pupils are equal, round, and reactive to light.  Cardiovascular:     Rate and Rhythm: Normal rate.     Pulses: Normal pulses.  Pulmonary:     Effort: Pulmonary effort is normal.  Abdominal:     General: Abdomen is flat. There is no distension.     Palpations: Abdomen is soft.     Tenderness: There is no abdominal  tenderness.  Musculoskeletal:        General: No swelling or deformity.  Right lower leg: No edema.     Left lower leg: No edema.  Skin:    General: Skin is warm and dry.  Neurological:     Mental Status: He is alert. Mental status is at baseline.     ED Results and Treatments Labs (all labs ordered are listed, but only abnormal results are displayed) Labs Reviewed  BASIC METABOLIC PANEL WITH GFR - Abnormal; Notable for the following components:      Result Value   Glucose, Bld 105 (*)    All other components within normal limits  CBC WITH DIFFERENTIAL/PLATELET - Abnormal; Notable for the following components:   WBC 11.5 (*)    Platelets 409 (*)    Neutro Abs 8.4 (*)    Monocytes Absolute 2.0 (*)    All other components within normal limits  RESP PANEL BY RT-PCR (RSV, FLU A&B, COVID)  RVPGX2                                                                                                                          Radiology CT PELVIS WO CONTRAST Result Date: 12/11/2023 CLINICAL DATA:  Pelvic pain.  Concern for stress fracture. EXAM: CT PELVIS WITHOUT CONTRAST TECHNIQUE: Multidetector CT imaging of the pelvis was performed following the standard protocol without intravenous contrast. RADIATION DOSE REDUCTION: This exam was performed according to the departmental dose-optimization program which includes automated exposure control, adjustment of the mA and/or kV according to patient size and/or use of iterative reconstruction technique. COMPARISON:  Pelvic radiograph dated 12/08/2023. FINDINGS: Urinary Tract: Partially visualized right renal cyst. Mildly irregular and trabeculated appearance of the bladder wall. Small stones noted in the urinary bladder measure up to 3 mm. Bowel: Moderate stool in the visualized colon. No bowel dilatation. The appendix is normal. Vascular/Lymphatic: Mild aortoiliac atherosclerotic disease. The IVC is unremarkable. No pelvic adenopathy. Reproductive:  The  prostate is grossly unremarkable. Other:  None Musculoskeletal: Status post prior ORIF of the left femoral neck. The visualized hardware is intact. There are displaced fractures of the lesser trochanter of the left femur. Old fracture of the left inferior pubic ramus. IMPRESSION: 1. Displaced fractures of the lesser trochanter of the left femur. 2. Status post prior ORIF of the left femoral neck fracture. 3. Small stones in the urinary bladder. 4.  Aortic Atherosclerosis (ICD10-I70.0). Electronically Signed   By: Vanetta Chou M.D.   On: 12/11/2023 11:48   VAS US  LOWER EXTREMITY VENOUS (DVT) Result Date: 12/11/2023  Lower Venous DVT Study Patient Name:  ARSHAWN VALDEZ  Date of Exam:   12/11/2023 Medical Rec #: [id number]      Accession #:    [id number] Date of Birth: 05-28-1930     Patient Gender: M Patient Age:   22 years Exam Location:  [facility] Procedure:      VAS US  LOWER EXTREMITY VENOUS (DVT) Referring Phys: FAIRY Rondy Krupinski --------------------------------------------------------------------------------  Indications: Pain.  Risk Factors: Trauma. Limitations: Poor ultrasound/tissue interface. Comparison Study:  No prior studies. Performing Technologist: Cordella Collet RVT  Examination Guidelines: A complete evaluation includes B-mode imaging, spectral Doppler, color Doppler, and power Doppler as needed of all accessible portions of each vessel. Bilateral testing is considered an integral part of a complete examination. Limited examinations for reoccurring indications may be performed as noted. The reflux portion of the exam is performed with the patient in reverse Trendelenburg.  +-----+---------------+---------+-----------+----------+--------------+ RIGHTCompressibilityPhasicitySpontaneityPropertiesThrombus Aging +-----+---------------+---------+-----------+----------+--------------+ CFV  Full           Yes      Yes                                  +-----+---------------+---------+-----------+----------+--------------+   +---------+---------------+---------+-----------+----------+--------------+ LEFT     CompressibilityPhasicitySpontaneityPropertiesThrombus Aging +---------+---------------+---------+-----------+----------+--------------+ CFV      Full           Yes      Yes                                 +---------+---------------+---------+-----------+----------+--------------+ SFJ      Full                                                        +---------+---------------+---------+-----------+----------+--------------+ FV Prox  Full                                                        +---------+---------------+---------+-----------+----------+--------------+ FV Mid   Full                                                        +---------+---------------+---------+-----------+----------+--------------+ FV Distal               Yes      Yes                                 +---------+---------------+---------+-----------+----------+--------------+ PFV      Full                                                        +---------+---------------+---------+-----------+----------+--------------+ POP      Full           Yes      Yes                                 +---------+---------------+---------+-----------+----------+--------------+ PTV      Full                                                        +---------+---------------+---------+-----------+----------+--------------+  PERO     Full                                                        +---------+---------------+---------+-----------+----------+--------------+    Summary: RIGHT: - No evidence of common femoral vein obstruction.   LEFT: - There is no evidence of deep vein thrombosis in the lower extremity. However, portions of this examination were limited- see technologist comments above.  - No cystic structure found in the popliteal  fossa.  *See table(s) above for measurements and observations.    Preliminary     Pertinent labs & imaging results that were available during my care of the patient were reviewed by me and considered in my medical decision making (see MDM for details).  Medications Ordered in ED Medications - No data to display                                                                                                                                   Procedures Procedures  (including critical care time)  Medical Decision Making / ED Course   This patient presents to the ED for concern of left hip pain following a fall 4 days ago., this involves an extensive number of treatment options, and is a complaint that carries with it a high risk of complications and morbidity.  The differential diagnosis includes occult fracture, postoperative pain, pubic rami fracture, DVT.  MDM: On exam, patient overall looks well.  Surgical scar continues to be well-healing.  Will obtain a DVT ultrasound in case that is the cause of the patient's pain.  No evidence of cellulitis.  Patient has a soft abdomen, stable hips.  Neurologically he is at his baseline.  Reviewed imaging done 4 days ago.  Will obtain imaging of the patient's pelvis.  Reassessment 2:30 PM-patient's CT scan showed a lesser trochanter fracture, possibly some involvement of the joint with the intramedullary nail.  Spoke with Ortho recommended outpatient follow-up.  Obtain basic labs and the patient which were normal.  He is here with his daughter, they would like to go home.  Will discharge patient.   Additional history obtained: -Additional history obtained from daughter at bedside -External records from outside source obtained and reviewed including: Chart review including previous notes, labs, imaging, consultation notes   Lab Tests: -I ordered, reviewed, and interpreted labs.   The pertinent results include:   Labs Reviewed  BASIC METABOLIC  PANEL WITH GFR - Abnormal; Notable for the following components:      Result Value   Glucose, Bld 105 (*)    All other components within normal limits  CBC WITH DIFFERENTIAL/PLATELET - Abnormal; Notable for the following components:   WBC 11.5 (*)    Platelets 409 (*)  Neutro Abs 8.4 (*)    Monocytes Absolute 2.0 (*)    All other components within normal limits  RESP PANEL BY RT-PCR (RSV, FLU A&B, COVID)  RVPGX2        Imaging Studies ordered: I ordered imaging studies including CT pelvis I independently visualized and interpreted imaging. I agree with the radiologist interpretation   Medicines ordered and prescription drug management: No orders of the defined types were placed in this encounter.   -I have reviewed the patients home medicines and have made adjustments as needed   Cardiac Monitoring: The patient was maintained on a cardiac monitor.  I personally viewed and interpreted the cardiac monitored which showed an underlying rhythm of: Sinus rhythm  Social Determinants of Health:  Factors impacting patients care include: End-stage   Reevaluation: After the interventions noted above, I reevaluated the patient and found that they have :improved  Co morbidities that complicate the patient evaluation  Past Medical History:  Diagnosis Date   Bronchiectasis without complication (HCC)    Enlarged prostate    Hearing loss    History of kidney stones    Hypertension    Pulmonary nocardiosis (HCC) 2021   treated with 6 month antibiotic course   Stroke (HCC) 2022   left frontoparietal   TIA (transient ischemic attack) 2019      Dispostion: I considered admission for this patient, however he wishes to go home and is appropriate for discharge.     Final Clinical Impression(s) / ED Diagnoses Final diagnoses:  Closed avulsion fracture of lesser trochanter of left femur, initial encounter (HCC)     @PCDICTATION @    Mannie Pac T, DO 12/11/23  1441

## 2023-12-11 NOTE — ED Triage Notes (Signed)
 Pt BIBA from [facility] for pain related to fall 4 days ago.  Pt has surgery on left hip 1 month ago.    Denies pain at this time

## 2023-12-11 NOTE — Progress Notes (Signed)
 Left lower extremity venous duplex has been completed. Preliminary results can be found in CV Proc through chart review.  Results were given to Dr. Mannie.   12/11/23 10:35 AM Cathlyn Collet RVT

## 2023-12-21 NOTE — Progress Notes (Signed)
 [facility] Summary Report / Loop Recorder

## 2023-12-27 ENCOUNTER — Ambulatory Visit (INDEPENDENT_AMBULATORY_CARE_PROVIDER_SITE_OTHER)

## 2023-12-27 DIAGNOSIS — I639 Cerebral infarction, unspecified: Secondary | ICD-10-CM | POA: Diagnosis not present

## 2023-12-28 LAB — CUP PACEART REMOTE DEVICE CHECK
Date Time Interrogation Session: 20250812231410
Implantable Pulse Generator Implant Date: 20220901

## 2023-12-29 ENCOUNTER — Encounter

## 2024-01-01 ENCOUNTER — Ambulatory Visit: Payer: Self-pay | Admitting: Cardiovascular Disease

## 2024-01-27 ENCOUNTER — Encounter

## 2024-01-30 ENCOUNTER — Encounter

## 2024-02-09 NOTE — Progress Notes (Signed)
 Remote Loop Recorder Transmission

## 2024-02-22 NOTE — Progress Notes (Signed)
 Remote Loop Recorder Transmission

## 2024-02-27 ENCOUNTER — Encounter

## 2024-03-01 ENCOUNTER — Encounter

## 2024-03-15 ENCOUNTER — Encounter

## 2024-03-29 ENCOUNTER — Encounter

## 2024-03-29 ENCOUNTER — Ambulatory Visit (INDEPENDENT_AMBULATORY_CARE_PROVIDER_SITE_OTHER)

## 2024-03-29 DIAGNOSIS — I639 Cerebral infarction, unspecified: Secondary | ICD-10-CM | POA: Diagnosis not present

## 2024-03-29 LAB — CUP PACEART REMOTE DEVICE CHECK
Date Time Interrogation Session: 20251112231617
Implantable Pulse Generator Implant Date: 20220901

## 2024-03-30 NOTE — Progress Notes (Signed)
 Remote Loop Recorder Transmission

## 2024-04-01 ENCOUNTER — Inpatient Hospital Stay (HOSPITAL_COMMUNITY)
Admission: RE | Admit: 2024-04-01 | Discharge: 2024-04-05 | DRG: 535 | Disposition: A | Discharge status: Nursing Home (Includes ICF) | Source: Skilled Nursing Facility | Attending: Family Medicine | Admitting: Family Medicine

## 2024-04-01 ENCOUNTER — Emergency Department (HOSPITAL_COMMUNITY)

## 2024-04-01 ENCOUNTER — Other Ambulatory Visit: Payer: Self-pay

## 2024-04-01 DIAGNOSIS — W19XXXA Unspecified fall, initial encounter: Secondary | ICD-10-CM | POA: Diagnosis present

## 2024-04-01 DIAGNOSIS — J479 Bronchiectasis, uncomplicated: Secondary | ICD-10-CM | POA: Diagnosis present

## 2024-04-01 DIAGNOSIS — Z87891 Personal history of nicotine dependence: Secondary | ICD-10-CM | POA: Diagnosis not present

## 2024-04-01 DIAGNOSIS — N3000 Acute cystitis without hematuria: Secondary | ICD-10-CM | POA: Diagnosis not present

## 2024-04-01 DIAGNOSIS — Z87442 Personal history of urinary calculi: Secondary | ICD-10-CM | POA: Diagnosis not present

## 2024-04-01 DIAGNOSIS — G9341 Metabolic encephalopathy: Secondary | ICD-10-CM | POA: Diagnosis present

## 2024-04-01 DIAGNOSIS — Z66 Do not resuscitate: Secondary | ICD-10-CM | POA: Diagnosis present

## 2024-04-01 DIAGNOSIS — M7989 Other specified soft tissue disorders: Secondary | ICD-10-CM | POA: Diagnosis present

## 2024-04-01 DIAGNOSIS — Z885 Allergy status to narcotic agent status: Secondary | ICD-10-CM | POA: Diagnosis not present

## 2024-04-01 DIAGNOSIS — Z993 Dependence on wheelchair: Secondary | ICD-10-CM

## 2024-04-01 DIAGNOSIS — Z8673 Personal history of transient ischemic attack (TIA), and cerebral infarction without residual deficits: Secondary | ICD-10-CM

## 2024-04-01 DIAGNOSIS — K219 Gastro-esophageal reflux disease without esophagitis: Secondary | ICD-10-CM | POA: Diagnosis present

## 2024-04-01 DIAGNOSIS — D75839 Thrombocytosis, unspecified: Secondary | ICD-10-CM | POA: Diagnosis present

## 2024-04-01 DIAGNOSIS — E785 Hyperlipidemia, unspecified: Secondary | ICD-10-CM | POA: Diagnosis present

## 2024-04-01 DIAGNOSIS — R269 Unspecified abnormalities of gait and mobility: Secondary | ICD-10-CM | POA: Diagnosis present

## 2024-04-01 DIAGNOSIS — S72012A Unspecified intracapsular fracture of left femur, initial encounter for closed fracture: Principal | ICD-10-CM | POA: Diagnosis present

## 2024-04-01 DIAGNOSIS — N390 Urinary tract infection, site not specified: Secondary | ICD-10-CM | POA: Diagnosis present

## 2024-04-01 DIAGNOSIS — N400 Enlarged prostate without lower urinary tract symptoms: Secondary | ICD-10-CM | POA: Diagnosis present

## 2024-04-01 DIAGNOSIS — Y92129 Unspecified place in nursing home as the place of occurrence of the external cause: Secondary | ICD-10-CM

## 2024-04-01 DIAGNOSIS — H9190 Unspecified hearing loss, unspecified ear: Secondary | ICD-10-CM | POA: Diagnosis present

## 2024-04-01 DIAGNOSIS — Y92009 Unspecified place in unspecified non-institutional (private) residence as the place of occurrence of the external cause: Secondary | ICD-10-CM | POA: Diagnosis not present

## 2024-04-01 DIAGNOSIS — Z7902 Long term (current) use of antithrombotics/antiplatelets: Secondary | ICD-10-CM

## 2024-04-01 DIAGNOSIS — S72092S Other fracture of head and neck of left femur, sequela: Secondary | ICD-10-CM | POA: Diagnosis not present

## 2024-04-01 DIAGNOSIS — R4189 Other symptoms and signs involving cognitive functions and awareness: Secondary | ICD-10-CM | POA: Diagnosis present

## 2024-04-01 DIAGNOSIS — G9340 Encephalopathy, unspecified: Secondary | ICD-10-CM | POA: Diagnosis not present

## 2024-04-01 DIAGNOSIS — Z79899 Other long term (current) drug therapy: Secondary | ICD-10-CM | POA: Diagnosis not present

## 2024-04-01 DIAGNOSIS — Z9081 Acquired absence of spleen: Secondary | ICD-10-CM

## 2024-04-01 DIAGNOSIS — S0003XA Contusion of scalp, initial encounter: Secondary | ICD-10-CM | POA: Diagnosis present

## 2024-04-01 DIAGNOSIS — Z91199 Patient's noncompliance with other medical treatment and regimen due to unspecified reason: Secondary | ICD-10-CM

## 2024-04-01 DIAGNOSIS — S7292XA Unspecified fracture of left femur, initial encounter for closed fracture: Secondary | ICD-10-CM

## 2024-04-01 DIAGNOSIS — I10 Essential (primary) hypertension: Secondary | ICD-10-CM | POA: Diagnosis present

## 2024-04-01 LAB — URINALYSIS, ROUTINE W REFLEX MICROSCOPIC
Bilirubin Urine: NEGATIVE
Glucose, UA: NEGATIVE mg/dL
Ketones, ur: NEGATIVE mg/dL
Nitrite: POSITIVE — AB
Protein, ur: NEGATIVE mg/dL
Specific Gravity, Urine: 1.013 (ref 1.005–1.030)
pH: 6 (ref 5.0–8.0)

## 2024-04-01 LAB — CBC WITH DIFFERENTIAL/PLATELET
Abs Immature Granulocytes: 0.05 K/uL (ref 0.00–0.07)
Basophils Absolute: 0.1 K/uL (ref 0.0–0.1)
Basophils Relative: 1 %
Eosinophils Absolute: 0.2 K/uL (ref 0.0–0.5)
Eosinophils Relative: 2 %
HCT: 39.7 % (ref 39.0–52.0)
Hemoglobin: 13.1 g/dL (ref 13.0–17.0)
Immature Granulocytes: 1 %
Lymphocytes Relative: 10 %
Lymphs Abs: 1 K/uL (ref 0.7–4.0)
MCH: 31.3 pg (ref 26.0–34.0)
MCHC: 33 g/dL (ref 30.0–36.0)
MCV: 94.7 fL (ref 80.0–100.0)
Monocytes Absolute: 1.5 K/uL — ABNORMAL HIGH (ref 0.1–1.0)
Monocytes Relative: 16 %
Neutro Abs: 7 K/uL (ref 1.7–7.7)
Neutrophils Relative %: 70 %
Platelets: 425 K/uL — ABNORMAL HIGH (ref 150–400)
RBC: 4.19 MIL/uL — ABNORMAL LOW (ref 4.22–5.81)
RDW: 13.3 % (ref 11.5–15.5)
WBC: 9.9 K/uL (ref 4.0–10.5)
nRBC: 0 % (ref 0.0–0.2)

## 2024-04-01 LAB — BASIC METABOLIC PANEL WITH GFR
Anion gap: 11 (ref 5–15)
BUN: 22 mg/dL (ref 8–23)
CO2: 24 mmol/L (ref 22–32)
Calcium: 9 mg/dL (ref 8.9–10.3)
Chloride: 103 mmol/L (ref 98–111)
Creatinine, Ser: 0.96 mg/dL (ref 0.61–1.24)
GFR, Estimated: >60 mL/min (ref >60)
Glucose, Bld: 88 mg/dL (ref 70–99)
Potassium: 3.7 mmol/L (ref 3.5–5.1)
Sodium: 138 mmol/L (ref 135–145)

## 2024-04-01 LAB — TSH: TSH: 1.012 u[IU]/mL (ref 0.350–4.500)

## 2024-04-01 LAB — ABO/RH: ABO/RH(D): B POS

## 2024-04-01 LAB — BRAIN NATRIURETIC PEPTIDE: B Natriuretic Peptide: 165.2 pg/mL — ABNORMAL HIGH (ref 0.0–100.0)

## 2024-04-01 LAB — D-DIMER, QUANTITATIVE: D-Dimer, Quant: 5.88 ug{FEU}/mL — ABNORMAL HIGH (ref 0.00–0.50)

## 2024-04-01 MED ORDER — DOCUSATE SODIUM 100 MG PO CAPS
100.0000 mg | ORAL_CAPSULE | Freq: Two times a day (BID) | ORAL | Status: DC
  Administered 2024-04-01 – 2024-04-05 (×6): 100 mg via ORAL
  Filled 2024-04-01 (×7): qty 1

## 2024-04-01 MED ORDER — OXYCODONE-ACETAMINOPHEN 5-325 MG PO TABS
1.0000 | ORAL_TABLET | Freq: Once | ORAL | Status: AC
  Administered 2024-04-01: 1 via ORAL
  Filled 2024-04-01: qty 1

## 2024-04-01 MED ORDER — ENOXAPARIN SODIUM 40 MG/0.4ML IJ SOSY
40.0000 mg | PREFILLED_SYRINGE | INTRAMUSCULAR | Status: DC
  Administered 2024-04-01 – 2024-04-05 (×5): 40 mg via SUBCUTANEOUS
  Filled 2024-04-01 (×5): qty 0.4

## 2024-04-01 MED ORDER — SODIUM CHLORIDE 0.9 % IV SOLN
1.0000 g | INTRAVENOUS | Status: DC
  Administered 2024-04-01 – 2024-04-04 (×4): 1 g via INTRAVENOUS
  Filled 2024-04-01 (×4): qty 10

## 2024-04-01 MED ORDER — HYDROCODONE-ACETAMINOPHEN 5-325 MG PO TABS
1.0000 | ORAL_TABLET | Freq: Four times a day (QID) | ORAL | Status: DC | PRN
  Administered 2024-04-01: 1 via ORAL
  Administered 2024-04-02 – 2024-04-03 (×3): 2 via ORAL
  Filled 2024-04-01: qty 2
  Filled 2024-04-01: qty 1
  Filled 2024-04-01 (×3): qty 2

## 2024-04-01 MED ORDER — FENTANYL CITRATE (PF) 50 MCG/ML IJ SOSY
25.0000 ug | PREFILLED_SYRINGE | INTRAMUSCULAR | Status: DC | PRN
  Administered 2024-04-01 – 2024-04-02 (×2): 25 ug via INTRAVENOUS
  Filled 2024-04-01 (×2): qty 1

## 2024-04-01 MED ORDER — LIDOCAINE-EPINEPHRINE-TETRACAINE (LET) TOPICAL GEL
3.0000 mL | Freq: Once | TOPICAL | Status: AC
  Administered 2024-04-01: 3 mL via TOPICAL
  Filled 2024-04-01: qty 3

## 2024-04-01 MED ORDER — MORPHINE SULFATE (PF) 4 MG/ML IV SOLN
4.0000 mg | Freq: Once | INTRAVENOUS | Status: AC
  Administered 2024-04-01: 4 mg via INTRAVENOUS
  Filled 2024-04-01: qty 1

## 2024-04-01 MED ORDER — SODIUM CHLORIDE 0.9 % IV SOLN: 2.0000 g | INTRAVENOUS | Status: DC

## 2024-04-01 MED ORDER — TAMSULOSIN HCL 0.4 MG PO CAPS
0.4000 mg | ORAL_CAPSULE | Freq: Every day | ORAL | Status: DC
  Administered 2024-04-01 – 2024-04-04 (×4): 0.4 mg via ORAL
  Filled 2024-04-01 (×4): qty 1

## 2024-04-01 MED ORDER — FAMOTIDINE 20 MG PO TABS
20.0000 mg | ORAL_TABLET | Freq: Every day | ORAL | Status: DC
  Administered 2024-04-03 – 2024-04-05 (×3): 20 mg via ORAL
  Filled 2024-04-01 (×3): qty 1

## 2024-04-01 MED ORDER — ROSUVASTATIN CALCIUM 20 MG PO TABS
20.0000 mg | ORAL_TABLET | Freq: Every day | ORAL | Status: DC
  Administered 2024-04-03 – 2024-04-05 (×3): 20 mg via ORAL
  Filled 2024-04-01 (×3): qty 1

## 2024-04-01 MED ORDER — FUROSEMIDE 10 MG/ML IJ SOLN
40.0000 mg | Freq: Once | INTRAMUSCULAR | Status: AC
  Administered 2024-04-01: 40 mg via INTRAVENOUS
  Filled 2024-04-01: qty 4

## 2024-04-01 MED ORDER — ONDANSETRON HCL 4 MG/2ML IJ SOLN
4.0000 mg | Freq: Once | INTRAMUSCULAR | Status: AC
  Administered 2024-04-01: 4 mg via INTRAVENOUS
  Filled 2024-04-01: qty 2

## 2024-04-01 NOTE — ED Provider Triage Note (Signed)
 Emergency Medicine Provider Triage Evaluation Note  Kveon Casanas , a 89 y.o. male  was evaluated in triage.  Pt complains of unwitnessed fall at ALF. Family at bedside. On Plavix . States he fell trying to walk from his sink to his couch.  Code status: DNR  Review of Systems  Positive: As above Negative: As above  Physical Exam  BP (!) 158/92   Pulse 95   Temp 97.7 F (36.5 C) (Oral)   Resp (!) 26   SpO2 95%  Gen:   Awake, no distress. Hard of hearing. Resp:  Normal effort. Lungs CTAB. MSK:   TTP to the RIGHT hip w/o crepitus. Other:  C-collar in place. Contusion with overlying laceration to L posterior parietal scalp. Pitting edema BLE.  Medical Decision Making  Medically screening exam initiated at 6:14 AM.  Appropriate orders placed.  Zorawar Strollo was informed that the remainder of the evaluation will be completed by another provider, this initial triage assessment does not replace that evaluation, and the importance of remaining in the ED until their evaluation is complete.  Unwitnessed fall at ALF. On Plavix . Labs and imaging initiated.  Code status DNR   Keith Sor, PA-C 04/01/24 [id number]

## 2024-04-01 NOTE — ED Provider Notes (Signed)
 [place] EMERGENCY DEPARTMENT AT [facility] Provider Note   CSN: [id number] Arrival date & time: 04/01/24  9449     Patient presents with: Fall (Unwitnessed )  HP  Samuel Mata is a 89 y.o. male with HTN, TIA, pulmonary nocardiosis presenting for unwitnessed fall.  Patient brought in by EMS from [place].  He states that he tripped over his wheelchair getting out of bed and tripped and fell backwards hitting the back of his head on the wheelchair and falling backwards landing on his buttock.  He is accompanied by his daughter who reports that shortly after he had his room alarm and the staff was able to assist him.  Now has a laceration to the back of his head.  Endorsing some pain in the right hip.  She reports that he is mostly wheelchair-bound but recently has been attempting to ambulate without assistance.  He denies any preceding chest pain, palpitation, shortness of breath or dizziness.    Fall       Prior to Admission medications   Medication Sig Start Date End Date Taking? Authorizing Provider  acetaminophen  (TYLENOL ) 325 MG tablet Take 650 mg by mouth in the morning, at noon, and at bedtime.    [provider]  clopidogrel  (PLAVIX ) 75 MG tablet Take 1 tablet (75 mg total) by mouth daily. 11/22/23   Angiulli, Toribio [name], PA-C  docusate sodium  (COLACE) 100 MG capsule Take 1 capsule (100 mg total) by mouth 2 (two) times daily. 11/22/23   Angiulli, Toribio [name], PA-C  famotidine  (PEPCID ) 20 MG tablet Take 1 tablet (20 mg total) by mouth daily. 11/22/23   Angiulli, Daniel J, PA-C  melatonin 3 MG TABS tablet Take 1 tablet (3 mg total) by mouth at bedtime. 11/22/23   Angiulli, Toribio [name], PA-C  methocarbamol  (ROBAXIN ) 500 MG tablet Take 500 mg by mouth every 6 (six) hours as needed for muscle spasms.    [provider]  Multiple Vitamin (MULTIVITAMIN WITH MINERALS) TABS tablet Take 1 tablet by mouth daily. 11/06/23   Rashid, Farhan, MD  polyethylene glycol (MIRALAX  /  GLYCOLAX ) 17 g packet Take 17 g by mouth daily. 11/22/23   Angiulli, Toribio [name], PA-C  rosuvastatin  (CRESTOR ) 20 MG tablet Take 1 tablet (20 mg total) by mouth daily. 11/22/23   Angiulli, Toribio [name], PA-C  tamsulosin  (FLOMAX ) 0.4 MG CAPS capsule Take 1 capsule (0.4 mg total) by mouth daily after supper. Patient taking differently: Take 0.4 mg by mouth at bedtime. 11/22/23   Angiulli, Toribio [name], PA-C  traMADol  (ULTRAM ) 50 MG tablet Take 1 tablet (50 mg total) by mouth every 6 (six) hours as needed. Patient taking differently: Take 25 mg by mouth every 6 (six) hours as needed for moderate pain (pain score 4-6). 11/22/23 11/21/24  Angiulli, Toribio [name], PA-C  Vitamin D , Ergocalciferol , (DRISDOL ) 1.25 MG (50000 UNIT) CAPS capsule Take 1 capsule (50,000 Units total) by mouth every 7 (seven) days. 11/22/23   Angiulli, Toribio [name], PA-C    Allergies: Codeine    Review of Systems See HPI  Updated Vital Signs BP (!) 157/85   Pulse 87   Temp 98.4 F (36.9 C) (Axillary)   Resp 14   Ht 5' 7 (1.702 m)   Wt 57 kg   SpO2 (!) 86%   BMI 19.68 kg/m   Physical Exam Vitals and nursing note reviewed.  HENT:     Head: Normocephalic and atraumatic. No raccoon eyes or Battle's sign.  Mouth/Throat:     Mouth: Mucous membranes are moist.  Eyes:     General:        Right eye: No discharge.        Left eye: No discharge.     Conjunctiva/sclera: Conjunctivae normal.  Cardiovascular:     Rate and Rhythm: Normal rate and regular rhythm.     Pulses: Normal pulses.     Heart sounds: Normal heart sounds.  Pulmonary:     Effort: Pulmonary effort is normal.     Breath sounds: Normal breath sounds.  Abdominal:     General: Abdomen is flat.     Palpations: Abdomen is soft.  Musculoskeletal:     Comments: Able to actively flex and extend both his legs at the hip and the knee but did elicit pain in both right and left hips.  Pedal pulses are 2+.  Skin:    General: Skin is warm and dry.  Neurological:     General: No  focal deficit present.  Psychiatric:        Mood and Affect: Mood normal.     (all labs ordered are listed, but only abnormal results are displayed) Labs Reviewed  CBC WITH DIFFERENTIAL/PLATELET - Abnormal; Notable for the following components:      Result Value   RBC 4.19 (*)    Platelets 425 (*)    Monocytes Absolute 1.5 (*)    All other components within normal limits  BASIC METABOLIC PANEL WITH GFR  URINALYSIS, ROUTINE W REFLEX MICROSCOPIC    EKG: EKG Interpretation Date/Time:  Sunday April 01 2024 05:53:01 EST Ventricular Rate:  90 PR Interval:  173 QRS Duration:  105 QT Interval:  372 QTC Calculation: 456 R Axis:   -64  Text Interpretation: Sinus rhythm Ventricular premature complex Probable left atrial enlargement Confirmed by Cottie Cough [id number]) on 04/01/2024 7:17:00 AM  Radiology: CT PELVIS WO CONTRAST Result Date: 04/01/2024 EXAM: CT Pelvis, Without IV Contrast 04/01/2024 08:25:51 AM TECHNIQUE: Axial images were acquired through the pelvis without IV contrast. Reformatted images were reviewed. Automated exposure control, iterative reconstruction, and/or weight based adjustment of the mA/kV was utilized to reduce the radiation dose to as low as reasonably achievable. COMPARISON: AP radiograph of the pelvis dated 04/01/2024 and CT of the pelvis dated 12/11/2023. CLINICAL HISTORY: Hip trauma, fracture suspected, xray done. FINDINGS: BONES: New buckling of the cortex at the junction of the left femoral head and neck on the axial and coronal images, compatible with a nondisplaced subcapital femoral neck fracture. Status post orthopedic fixation of a basicervical and intertrochanteric fracture, treated with an orthopedic screw within the femoral head and neck and an anchoring rod within the proximal femoral shaft. The orthopedic hardware is intact. The bony pelvis is intact. JOINTS: No dislocation. The joint spaces are normal. SOFT TISSUES: The soft tissues are  unremarkable. INTRAPELVIC CONTENTS: Limited images of the intrapelvic contents demonstrate no acute abnormality. IMPRESSION: 1. New nondisplaced subcapital femoral neck fracture on the left 2. Status post orthopedic fixation of prior basicervical/intertrochanteric left femur fracture with intact hardware Electronically signed by: Evalene Coho MD 04/01/2024 08:37 AM EST RP Workstation: [id number]   DG Pelvis Portable Result Date: 04/01/2024 EXAM: 1 or 2 VIEW(S) XRAY OF THE PELVIS 04/01/2024 06:29:00 AM COMPARISON: CT of the pelvis 12/11/2023. CLINICAL HISTORY: fall FINDINGS: BONES AND JOINTS: Osteopenia is present. Left hip nailing hardware is similar in positioning. The intertrochanteric fracture noted on the prior study, which was in the early stages of healing, appears  to have healed. New from the prior study, there is a subtle cortical step-off at the junction of the superior head and neck of the left femur suspicious for a new fracture such as a subcapital proximal left femoral fracture. There is no other evidence of fractures. Joint spaces are maintained. No AP evidence of pelvic fracture or diastasis. SOFT TISSUES: Calcific plaques in both proximal femoral arteries. IMPRESSION: 1. Subtle cortical step-off at the superior left femoral head-neck junction suspicious for a new subcapital proximal left femoral fracture. 2. Left hip nailing hardware in similar positioning. Electronically signed by: Francis Quam MD 04/01/2024 07:33 AM EST RP Workstation: [id number]   CT Cervical Spine Wo Contrast Result Date: 04/01/2024 EXAM: CT CERVICAL SPINE WITHOUT CONTRAST 04/01/2024 06:45:07 AM TECHNIQUE: CT of the cervical spine was performed without the administration of intravenous contrast. Multiplanar reformatted images are provided for review. Automated exposure control, iterative reconstruction, and/or weight based adjustment of the mA/kV was utilized to reduce the radiation dose to as low as reasonably  achievable. COMPARISON: Cervical spine CT 12/08/2023. CLINICAL HISTORY: Neck trauma (Age >= 65y). FINDINGS: CERVICAL SPINE: BONES AND ALIGNMENT: There is osteopenia without evidence of fractures or focal pathologic process. There is chronic narrowing and spur formation of the anterior atlantoaxial joint. Chronic minimal listhesis at C4-C5 likely degenerative. No traumatic or new alignment abnormality is seen. 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 DEGENERATIVE CHANGES: Degenerative changes are again noted, greatest at C5-C6 and C6-C7 where the discs are collapsed and there are small bidirectional endplate spurs. A mild chronic disc bulge is again noted at C3-C4, but again is nonstenosing. There are facet joint and uncinate spurs at most levels but no significant foraminal stenosis. No significant soft tissue or bony encroachment of the thecal sac is seen. SOFT TISSUES: No prevertebral soft tissue swelling. There are calcific plaques at the carotid bifurcations. Lung apices show scarring changes without pneumothorax. IMPRESSION: 1. Osteopenia and degenerative change without evidence of fractures. 2. Atherosclerotic calcifications at the carotid bifurcations. Electronically signed by: Francis Quam MD 04/01/2024 07:26 AM EST RP Workstation: [id number]   CT HEAD WO CONTRAST ( ) Result Date: 04/01/2024 EXAM: CT HEAD WITHOUT CONTRAST 04/01/2024 06:45:07 AM TECHNIQUE: CT of the head was performed without the administration of intravenous contrast. Automated exposure control, iterative reconstruction, and/or weight based adjustment of the mA/kV was utilized to reduce the radiation dose to as low as reasonably achievable. COMPARISON: Head CT from 12/08/2023. CLINICAL HISTORY: Head trauma, minor (Age >= 65y). Un witnessed fall. Currently on blood thinner medication. FINDINGS: BRAIN AND VENTRICLES: There are chronic lacunar infarcts of the junction of the right thalamus and internal capsule  and in the superior aspect of the left cerebellar hemisphere. A chronic lacunar infarct is also noted in the right frontal white matter. There are dystrophic calcifications along the falx. There is moderately advanced cerebral and mild cerebellar atrophy, atrophic ventriculomegaly, and moderate to severe small vessel disease of the cerebral white matter. No acute hemorrhage. No cortical infarct. No extra-axial collection. No mass effect or midline shift. There is atherosclerosis in the siphons and distal vertebral arteries. No hyperdense central vessels. ORBITS: No acute abnormality. SINUSES: There is mucosal disease in the ethmoid air cells. Other sinuses, bilateral mastoid air cells, and middle ears are clear. SOFT TISSUES AND SKULL: There is a left parietal scalp contusion without a large hematoma. There are no depressed skull fractures. IMPRESSION: 1. No acute intracranial CT findings or depressed skull fractures. 2. Left  parietal scalp contusion. 3. Moderately advanced cerebral and mild cerebellar atrophy with atrophic ventriculomegaly, and moderate to severe chronic small vessel ischemic disease of the cerebral white matter. Stable exam. 4. Chronic lacunar infarcts. 5. Intracranial atherosclerosis. Electronically signed by: Francis Quam MD 04/01/2024 07:17 AM EST RP Workstation: [id number]   DG Chest Port 1 View Result Date: 04/01/2024 EXAM: 1 VIEW(S) XRAY OF THE CHEST 04/01/2024 06:29:00 AM COMPARISON: Portable chest 11/04/2023. CLINICAL HISTORY: fall fall FINDINGS: LINES, TUBES AND DEVICES: Loop recorder device again in the left chest wall. LUNGS AND PLEURA: Advanced pulmonary fibrosis without evidence of a focal or new infiltrate. No vascular congestion is seen. No substantial pleural effusion. No pneumothorax. HEART AND MEDIASTINUM: Mild cardiomegaly. Stable mediastinal contour with aortic tortuosity and atherosclerosis. BONES AND SOFT TISSUES: Osteopenia and thoracic spondylosis. Mild upper thoracic  dextroscoliosis. Chronic wedging and kyphoplasty of a lower thoracic segment. IMPRESSION: 1. No acute cardiopulmonary findings. 2. Advanced pulmonary fibrosis without new superimposed infiltrate. 3. Mild cardiomegaly without vascular congestion. Electronically signed by: Francis Quam MD 04/01/2024 07:02 AM EST RP Workstation: [id number]     Procedures   Medications Ordered in the ED  HYDROcodone -acetaminophen  (NORCO/VICODIN) 5-325 MG per tablet 1-2 tablet (has no administration in time range)  enoxaparin  (LOVENOX ) injection 40 mg (has no administration in time range)  fentaNYL  (SUBLIMAZE ) injection 25 mcg (has no administration in time range)  oxyCODONE -acetaminophen  (PERCOCET/ROXICET) 5-325 MG per tablet 1 tablet (1 tablet Oral Given 04/01/24 0631)  lidocaine -EPINEPHrine -tetracaine (LET) topical gel (3 mLs Topical Given 04/01/24 0826)  morphine  (PF) 4 MG/ML injection 4 mg (4 mg Intravenous Given 04/01/24 0902)  ondansetron  (ZOFRAN ) injection 4 mg (4 mg Intravenous Given 04/01/24 0902)    Clinical Course as of 04/01/24 1118  Sun Apr 01, 2024  0731 89 yo male w/ mechanical fall this morning, hit back of his head, right hip pain, after fall, on plavex.  The patient is hard of hearing but does not have dementia according to his daughter who, who is present at the bedside.  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  He lives in assisted living.  Patient himself is complaining of pain in his hips, right greater than left.  He was given oxycodone  on arrival, which has helped his pain somewhat.  Basic labs are unremarkable per my review.  CT imaging of the head and cervical spine were also unremarkable.  He does have a laceration to back of the scalp that will be irrigated and likely need to be repaired.  However he does have significant tenderness with range of motion testing  of the bilateral hips, right greater than left.  X-rays were questionable for possible irregularity of the left femoral neck.  Patient is being sent for CT bony pelvis for better evaluation for underlying fracture. [MT]  [phone] Concern for left femoral neck fracture on CT [MT]  0938 PA to discuss with orthopedics and plan for admission [MT]  0951 Spoke to Dr Ernie who will confer with his colleagues about possible hip revision tomorrow, no OR plan today [MT]    Clinical Course User Index [MT] Trifan, Donnice [name], MD                                 Medical Decision Making Amount and/or Complexity of Data Reviewed Radiology:  ordered.  Risk Prescription drug management. Decision regarding hospitalization.   Initial Impression and Ddx 89 year old well-appearing male presenting for mechanical fall.  Exam notable for back there which bilateral hip pain with movement of both legs and small laceration to the posterior scalp. Ddx includes fractures, internal bleeding, concussion,TBI, DAI, pneumothorax, hemorrhage, spinal cord injury.  Patient PMH that increases complexity of ED encounter:  HTN, TIA, pulmonary nocardiosis  Interpretation of Diagnostics - I independent reviewed and interpreted the labs as followed: CBC and BMP unremarkable  - I independently visualized the following imaging with scope of interpretation limited to determining acute life threatening conditions related to emergency care: CT noncontrasted pelvis, which revealed 1. New nondisplaced subcapital femoral neck fracture on the left 2. Status post orthopedic fixation of prior basicervical/intertrochanteric left femur fracture with intact hardware.  Details of other imaging studies above  -I personally reviewed and interpreted EKG which revealed sinus rhythm without evidence of ischemia  Patient Reassessment and Ultimate Disposition/Management On reassessment still having some pain in both of his hips.  Workup thus far revealing  of nondisplaced femoral neck fracture.  Discussed with Dr. Ernie of orthopedics who advised hospital admission and potential hip revision tomorrow but no definitive plans at this time.  Recommended n.p.o. status at midnight.  Admitted to hospital service with Dr. Maximino Sharps.  He remains hemodynamically stable on room air.  Laceration on the back of his head was cleaned and irrigated thoroughly and was very superficial and did not feel it warranted glue or staples at this time.  Patient management required discussion with the following services or consulting groups:  Hospitalist Service and Orthopedic Surgery  Complexity of Problems Addressed Acute complicated illness or Injury  Additional Data Reviewed and Analyzed Further history obtained from: Past medical history and medications listed in the EMR and Prior ED visit notes  Patient Encounter Risk Assessment Consideration of hospitalization      Final diagnoses:  Fall, initial encounter  Closed fracture of left femur, unspecified fracture morphology, unspecified portion of femur, initial encounter [facility])    ED Discharge Orders     None          Amy, [place], PA-C 04/01/24 1118    Jerral Meth, MD 04/01/24 2307

## 2024-04-01 NOTE — Progress Notes (Signed)
 Consult request received for left subcapital femoral head/ neck fracture. Have discussed with Dr. Ernie patient's stability, pain control, and ongoing work up. I have reviewed x-rays and developed a provisional plan.   Full consultation to follow in the am. Fixation appears to be above area of the fracture suggesting continued stability with current fixation.   Will discuss with patient and family. If improves over the next couple of weeks with WBAT then could continue nonop. If pain significant or patient fails to progress then could convert to arthroplasty.  Ozell Bruch, MD [facility], [facility] [phone]

## 2024-04-01 NOTE — Progress Notes (Signed)
 Patient ID: Samuel Mata, male   DOB: 12/24/1930, 89 y.o.   MRN: [id number]  Consult received for this recent fall History of left hip ORIF by Dr Celena  I have consulted Dr Celena to review films and determine if this needs further operative intervention He plans to review films and determine treatment plan

## 2024-04-01 NOTE — ED Notes (Signed)
 Patient transported to CT with RN

## 2024-04-01 NOTE — H&P (Signed)
 History and Physical    Patient: Samuel Mata FMW:[id number] DOB: 05/22/1930 DOA: 04/01/2024 DOS: the patient was seen and examined on 04/01/2024 PCP: Donata Snowman, PA-C  Patient coming from: [facility].  Chief Complaint:  Chief Complaint  Patient presents with   [place]    HPI: Samuel Mata is a 89 y.o. male with medical history significant of bronchiectasis, essential hypertension, history of stroke, nephrolithiasis, history of nocardiosis pulmonary presents after having a fall at his assisted living facility. He is accompanied by his daughter, Lavern.  He experienced an unwitnessed fall early this morning at around 5 AM. He used his facility's medical alert pendant to call for assistance and was found on the floor.    In the past few days, he has been leaning to the left and exhibited confusion, which is atypical for him. Normally, he is alert and oriented, with only mild confusion that could be attributed to sun downing. His daughter wonders if he had a possible stroke recently.  He has been experiencing swelling in his legs. His primary doctor recently increased his Lasix dosage to address this issue. He has been advised to wear compression hose, but he has not been compliant.  He is normally able to perform activities of daily living independently, including toileting, dressing, and feeding himself.  In the emergency department patient was noted to be mildly tachypneic with blood pressures elevated up to 165/78, and O2 saturations noted to be as low as 86% for which he was placed on 2 L nasal cannula oxygen.  Labs noted mild thrombocytosis platelet count 425.  Pelvic x-ray showed subtle cortical step-off at the superior left head neck junction suspicious for new subcapital proximal left femur fracture.  CT imaging of the head and cervical spine noted no acute intracranial abnormality or cervical fracture patient did have a left parietal scalp contusion  noted with moderate to advanced cerebral and mild cerebellar atrophy and atrophic ventriculomegaly.  Case was discussed with Dr. Ernie of orthopedics and advised admission to the hospital with potential hip revision tomorrow.    Review of Systems: As mentioned in the history of present illness. All other systems reviewed and are negative. Past Medical History:  Diagnosis Date   Bronchiectasis without complication (HCC)    Enlarged prostate    Hearing loss    History of kidney stones    Hypertension    Pulmonary nocardiosis 2021   treated with 6 month antibiotic course   Stroke (HCC) 2022   left frontoparietal   TIA (transient ischemic attack) 2019   Past Surgical History:  Procedure Laterality Date   APPENDECTOMY     CHOLECYSTECTOMY     INTRAMEDULLARY (IM) NAIL INTERTROCHANTERIC Left 11/01/2023   Procedure: FIXATION, FRACTURE, INTERTROCHANTERIC, WITH INTRAMEDULLARY ROD;  Surgeon: Celena Sharper, MD;  Location: [facility] OR;  Service: Orthopedics;  Laterality: Left;   LOOP RECORDER INSERTION N/A 01/15/2021   Procedure: LOOP RECORDER INSERTION;  Surgeon: Kelsie Agent, MD;  Location: [facility] INVASIVE CV LAB;  Service: Cardiovascular;  Laterality: N/A;   NOSE SURGERY     SPINE SURGERY     compressoin fx T12, kyphoplasty   SPLENECTOMY, TOTAL     Social History:  reports that he quit smoking about 39 years ago. His smoking use included cigarettes. He started smoking about 69 years ago. He has a 30 pack-year smoking history. He has never used smokeless tobacco. He reports that he does not drink alcohol  and does not use drugs.  Allergies  Allergen Reactions   Codeine     Unknown reaction    Family History  Problem Relation Age of Onset   Lung disease Neg Hx     Prior to Admission medications   Medication Sig Start Date End Date Taking? Authorizing Provider  acetaminophen  (TYLENOL ) 325 MG tablet Take 650 mg by mouth in the morning, at noon, and at bedtime.    [provider]   clopidogrel  (PLAVIX ) 75 MG tablet Take 1 tablet (75 mg total) by mouth daily. 11/22/23   Angiulli, Toribio [name], PA-C  docusate sodium  (COLACE) 100 MG capsule Take 1 capsule (100 mg total) by mouth 2 (two) times daily. 11/22/23   Angiulli, Toribio [name], PA-C  famotidine  (PEPCID ) 20 MG tablet Take 1 tablet (20 mg total) by mouth daily. 11/22/23   Angiulli, Daniel J, PA-C  melatonin 3 MG TABS tablet Take 1 tablet (3 mg total) by mouth at bedtime. 11/22/23   Angiulli, Toribio [name], PA-C  methocarbamol  (ROBAXIN ) 500 MG tablet Take 500 mg by mouth every 6 (six) hours as needed for muscle spasms.    [provider]  Multiple Vitamin (MULTIVITAMIN WITH MINERALS) TABS tablet Take 1 tablet by mouth daily. 11/06/23   Rashid, Farhan, MD  polyethylene glycol (MIRALAX  / GLYCOLAX ) 17 g packet Take 17 g by mouth daily. 11/22/23   Angiulli, Toribio [name], PA-C  rosuvastatin  (CRESTOR ) 20 MG tablet Take 1 tablet (20 mg total) by mouth daily. 11/22/23   Angiulli, Toribio [name], PA-C  tamsulosin  (FLOMAX ) 0.4 MG CAPS capsule Take 1 capsule (0.4 mg total) by mouth daily after supper. Patient taking differently: Take 0.4 mg by mouth at bedtime. 11/22/23   Angiulli, Toribio [name], PA-C  traMADol  (ULTRAM ) 50 MG tablet Take 1 tablet (50 mg total) by mouth every 6 (six) hours as needed. Patient taking differently: Take 25 mg by mouth every 6 (six) hours as needed for moderate pain (pain score 4-6). 11/22/23 11/21/24  Angiulli, Toribio [name], PA-C  Vitamin D , Ergocalciferol , (DRISDOL ) 1.25 MG (50000 UNIT) CAPS capsule Take 1 capsule (50,000 Units total) by mouth every 7 (seven) days. 11/22/23   Pegge Toribio [name], PA-C    Physical Exam: Vitals:   04/01/24 0830 04/01/24 0900 04/01/24 0930 04/01/24 1026  BP: (!) 148/78 (!) 160/73 (!) 157/85   Pulse: 84 80 87   Resp: (!) 21 19 14    Temp:    98.4 F (36.9 C)  TempSrc:    Axillary  SpO2: 96% 95% (!) 86%   Weight:      Height:        Constitutional: Early male who appears to be lethargic, but in no acute  distress Eyes: PERRL, lids and conjunctivae normal ENMT: Mucous membranes are moist.  Hard of hearing Neck: normal, supple.  JVD present Respiratory: clear to auscultation bilaterally, no wheezing, no crackles. Normal respiratory effort. No accessory muscle use.  Cardiovascular: Regular rate and rhythm, no murmurs / rubs / gallops. No extremity edema. 2+ pedal pulses. No carotid bruits.  Abdomen: no tenderness, no masses palpated. No hepatosplenomegaly. Bowel sounds positive.  Musculoskeletal: no clubbing / cyanosis.   Skin: Scalp bruising noted of posterior parietal lobe on the left-hand side Neurologic: CN 2-12 grossly intact. Sensation intact, DTR normal. Strength 5/5 in all 4.  Psychiatric: Patient seems somewhat confused. Alert and oriented person and place..   Data Reviewed:  Parietal EKG reveals sinus rhythm at 90 bpm with PVC. Reviewed labs, imaging, and pertinent records this document  Assessment and Plan:  Left subcapital femur fracture secondary to fall History of left intertrochanteric hip fracture Acute.  Patient presents with left hip pain after having an unwitnessed fall.  At baseline has gait disturbance for which he has been getting around with use of a wheelchair for but has been able to transfer on his own and complete all of his ADLs.  He sustained a contusion to the left parietal area.  Imaging studies revealed new nondisplaced subcapital femur neck fracture on the left with prior intertrochanteric fixation intact.  Orthopedic was consulted for possible need of surgical correction. - Admit to a telemetry bed - Hip fracture order set utilized - Strict bedrest - N.p.o. after midnight - Hydrocodone /fentanyl  IV as needed for moderate to severe pain - Transitions of care consulted for likely need of placement - Dr. Ernie of orthopedics consulted,   will follow-up for any further recommendations  Acute metabolic encephalopathy Patient reportedly had been more confused than  normal over the last couple of days and seemed to be leaning more towards his left.   CT scan of the head did not note any acute abnormality. - Delirium precautions - Check urinalysis  - May warrant further workup with MRI of the brain for further workup  Addendum: Urinary tract infection Present on admission.  Urinalysis significant for large leukocytes, positive nitrates, rare bacteria, 11 WBCs.  This could be a cause for patient being altered in the days leading up to his admission today. - Check urine culture - Start empiric antibiotics of Rocephin  Lower extremity swelling Patient had reportedly been swelling more in his lower extremities recently.  Patient had negative Doppler ultrasound of the lower extremities back in July of this year.  Lower suspicion for DVT but still a possibility given that he is mostly wheelchair-bound - Place TED hose - Check  BNP - Check Doppler of the lower extremity  Thrombocytosis Present on admission.  Platelet count mildly elevated at 425.  Question reactive nature.  Appears to be slightly higher than prior check back in July. - Continue to monitor  History of stroke Hyperlipidemia - Continue statin - Plan to resume Plavix   Bronchiectasis No significant wheezes or rhonchi appreciated at this time. - Incentive spirometry - Breathing treatments as needed  Cognitive impairment - Delirium precautions  BPH - Continue Flomax   GERD - Continue Pepcid   DVT prophylaxis: Lovenox  Advance Care Planning:   Code Status: Do not attempt resuscitation (DNR) PRE-ARREST INTERVENTIONS DESIRED    Consults:  Family Communication:    Severity of Illness: The appropriate patient status for this patient is INPATIENT. Inpatient status is judged to be reasonable and necessary in order to provide the required intensity of service to ensure the patient's safety. The patient's presenting symptoms, physical exam findings, and initial radiographic and laboratory  data in the context of their chronic comorbidities is felt to place them at high risk for further clinical deterioration. Furthermore, it is not anticipated that the patient will be medically stable for discharge from the hospital within 2 midnights of admission.   * I certify that at the point of admission it is my clinical judgment that the patient will require inpatient hospital care spanning beyond 2 midnights from the point of admission due to high intensity of service, high risk for further deterioration and high frequency of surveillance required.*  Author: Maximino DELENA Sharps, MD 04/01/2024 10:41 AM  For on call review [email or web address].

## 2024-04-01 NOTE — ED Triage Notes (Signed)
 Pt. BIB [facility] for an unwitnessed fall at [facility]. Patient currently on Plavix . Patient was last seen at 2am per facility and they found him down at 415. He was lying on the L side and c/o R sided hip pain radiating to his testicles. Pt has a hematoma to the L side. Received 25 of fent en route. Vital signs within stable limits on arrival.

## 2024-04-02 ENCOUNTER — Encounter

## 2024-04-02 ENCOUNTER — Inpatient Hospital Stay (HOSPITAL_COMMUNITY)

## 2024-04-02 DIAGNOSIS — S72012A Unspecified intracapsular fracture of left femur, initial encounter for closed fracture: Secondary | ICD-10-CM | POA: Diagnosis not present

## 2024-04-02 DIAGNOSIS — M7989 Other specified soft tissue disorders: Secondary | ICD-10-CM

## 2024-04-02 LAB — BASIC METABOLIC PANEL WITH GFR
Anion gap: 10 (ref 5–15)
BUN: 22 mg/dL (ref 8–23)
CO2: 28 mmol/L (ref 22–32)
Calcium: 8.7 mg/dL — ABNORMAL LOW (ref 8.9–10.3)
Chloride: 103 mmol/L (ref 98–111)
Creatinine, Ser: 1.03 mg/dL (ref 0.61–1.24)
GFR, Estimated: >60 mL/min (ref >60)
Glucose, Bld: 81 mg/dL (ref 70–99)
Potassium: 3.7 mmol/L (ref 3.5–5.1)
Sodium: 141 mmol/L (ref 135–145)

## 2024-04-02 LAB — URINALYSIS, W/ REFLEX TO CULTURE (INFECTION SUSPECTED)
Bacteria, UA: NONE SEEN
Bilirubin Urine: NEGATIVE
Glucose, UA: NEGATIVE mg/dL
Ketones, ur: 5 mg/dL — AB
Nitrite: NEGATIVE
Protein, ur: 30 mg/dL — AB
Specific Gravity, Urine: 1.02 (ref 1.005–1.030)
pH: 6 (ref 5.0–8.0)

## 2024-04-02 LAB — CBC
HCT: 38.7 % — ABNORMAL LOW (ref 39.0–52.0)
Hemoglobin: 12.3 g/dL — ABNORMAL LOW (ref 13.0–17.0)
MCH: 30.8 pg (ref 26.0–34.0)
MCHC: 31.8 g/dL (ref 30.0–36.0)
MCV: 96.8 fL (ref 80.0–100.0)
Platelets: 375 K/uL (ref 150–400)
RBC: 4 MIL/uL — ABNORMAL LOW (ref 4.22–5.81)
RDW: 13.2 % (ref 11.5–15.5)
WBC: 9.1 K/uL (ref 4.0–10.5)
nRBC: 0 % (ref 0.0–0.2)

## 2024-04-02 LAB — TYPE AND SCREEN
ABO/RH(D): B POS
Antibody Screen: NEGATIVE

## 2024-04-02 MED ORDER — SODIUM CHLORIDE 0.9 % IV SOLN: INTRAVENOUS | Status: AC

## 2024-04-02 NOTE — Progress Notes (Signed)
 VASCULAR LAB    Bilateral lower extremity venous duplex has been performed.  See CV proc for preliminary results.   Zaxton Angerer, RVT 04/02/2024, 4:12 PM

## 2024-04-02 NOTE — Progress Notes (Signed)
 Patient ID: Samuel Mata, male   DOB: Jan 01, 1931, 89 y.o.   MRN: [id number] Called to room because patient remove telemetry and oxygen. Patient confused and combative. Oxygen saturation 75%. MD notified. Order for soft wrist restraints. Restraints applied. Daughter, Samuel Mata, notified and states she is on her way. Patient remains very agitated and pulling hard against the restrains. Left room to see if patient would calm down without staff in room. Patient started to scream very loudly and oxygen saturation dropped. Sat with patient at bedside until daughter arrived. Patient is starting to calm down.  Verdie JONETTA Collier, RN

## 2024-04-02 NOTE — Progress Notes (Signed)
 PROGRESS NOTE    Samuel Mata  FMW:[id number] DOB: [birth year] DOA: 04/01/2024 PCP: Donata Snowman, PA-C   Brief Narrative:  This 89 yrs old Male with PMH significant for bronchiectasis, Essential hypertension, history of CVA, Nephrolithiasis, history of pulmonary nocardiosis presented after the fall at his assisted living facility.  Patient had an unwitnessed fall early morning around 5 AM.  He was found on the floor.  In the last few days patient has been leaning on the left and exhibited confusion which is atypical for him.  His daughter wonders he may be having a possible stroke.  In the ED pelvic x-ray showed subtle cortical subcapital proximal left femur fracture.  CT of the head and C-spine showed no acute intracranial abnormality or cervical fracture.  Patient did have left parietal scalp contusion.  Patient was admitted for further evaluation,  orthopedics is consulted.  Assessment & Plan:   Principal Problem:   Closed subcapital fracture of femur, left, initial encounter [facility]) Active Problems:   Fall at home, initial encounter   Acute encephalopathy   Urinary tract infection   Localized swelling of lower extremity   Thrombocytosis   History of CVA (cerebrovascular accident)   Hyperlipidemia   Bronchiectasis without complication (HCC)   Cognitive impairment   BPH (benign prostatic hyperplasia)  Left subcapital femur fracture secondary to fall: History of left intertrochanteric hip fracture: Patient presented with left hip pain after having an unwitnessed fall.  At baseline he has gait disturbance, he has been using wheelchair to get around.  Has been able to transfer on his own and complete all ADLs He sustained a contusion to the left parietal area.  Imaging studies revealed new nondisplaced subcapital femur neck fracture on the left with prior intertrochanteric fixation intact.  Orthopedic was consulted for possible need of surgical correction. Strict bedrest,   Continue adequate pain control. TOC consulted for possible need for placement. Orthopedics is consulted, she may or may not require operative management.  Keep n.p.o. for now.   Acute metabolic encephalopathy: Patient reportedly had been more confused than normal over the last couple of days. Daughter reports leaning more towards his left.    CT scan of the head did not note any acute abnormality. Continue Delirium precautions UA positive.  Could be contributing.  Start ceftriaxone.  Follow-up urine culture May warrant further workup with MRI of the brain for further workup.  Lower extremity swelling: He was noted to have more swelling in his lower extremities recently. Patient had negative Doppler ultrasound of the lower extremities back in July of this year.   Lower suspicion for DVT but still a possibility given that he is mostly wheelchair-bound. BNP 165.2 Check Doppler of the lower extremity   Thrombocytosis: Likely reactive, now back to normal.  History of stroke: Hyperlipidemia Continue statin Plan to resume Plavix    Bronchiectasis No significant wheezes or rhonchi appreciated at this time. Incentive spirometry Continue Breathing treatments as needed   Cognitive impairment: Delirium precautions.   BPH: Continue Flomax    GERD: Continue Pepcid   DVT prophylaxis: Lovenox  Code Status:DNR Family Communication: No family at bed side Disposition Plan:    Status is: Inpatient Remains inpatient appropriate because: Status post mechanical fall with left femur fracture.  Orthopedics is consulted    Consultants:  Orthopeadics  Procedures:  Antimicrobials: Anti-infectives (From admission, onward)    Start     Dose/Rate Route Frequency Ordered Stop   04/01/24 1400  cefTRIAXone (ROCEPHIN) 2 g in sodium chloride  0.9 % 100  mL IVPB  Status:  Discontinued        2 g 200 mL/hr over 30 Minutes Intravenous Every 24 hours 04/01/24 1355 04/01/24 1355   04/01/24 1400   cefTRIAXone (ROCEPHIN) 1 g in sodium chloride  0.9 % 100 mL IVPB        1 g 200 mL/hr over 30 Minutes Intravenous Every 24 hours 04/01/24 1355        Subjective: Patient was seen and examined at bedside.  Overnight events noted. Patient remains confused,  Patient remains in soft mittens just to avoid pulling IV lines.  Objective: Vitals:   04/02/24 0337 04/02/24 0820 04/02/24 1155 04/02/24 1253  BP: (!) 141/69 (!) 143/65    Pulse: 78 69    Resp: 17 17    Temp: 98.3 F (36.8 C) 98.2 F (36.8 C)    TempSrc:      SpO2:  96% (!) 75% 96%  Weight:      Height:        Intake/Output Summary (Last 24 hours) at 04/02/2024 1454 Last data filed at 04/01/2024 2200 Gross per 24 hour  Intake 180 ml  Output 200 ml  Net -20 ml   Filed Weights   04/01/24 0617 04/01/24 0619  Weight: 57 kg 57 kg    Examination:  General exam: Appears calm and comfortable, not in any acute distress Respiratory system: Clear to auscultation. Respiratory effort normal. RR 15 Cardiovascular system: S1 & S2 heard, RRR. No JVD, murmurs, rubs, gallops or clicks. Gastrointestinal system: Abdomen is non distended, soft and non tender. Normal bowel sounds heard. Central nervous system: Alert and oriented x 1. No focal neurological deficits. Extremities: Left hip tenderness noted,  Skin: No rashes, lesions or ulcers Psychiatry:  Mood & affect appropriate.     Data Reviewed: I have personally reviewed following labs and imaging studies  CBC: Recent Labs  Lab 04/01/24 0551 04/02/24 0418  WBC 9.9 9.1  NEUTROABS 7.0  --   HGB 13.1 12.3*  HCT 39.7 38.7*  MCV 94.7 96.8  PLT 425* 375   Basic Metabolic Panel: Recent Labs  Lab 04/01/24 0551 04/02/24 0418  NA 138 141  K 3.7 3.7  CL 103 103  CO2 24 28  GLUCOSE 88 81  BUN 22 22  CREATININE 0.96 1.03  CALCIUM  9.0 8.7*   GFR: Estimated Creatinine Clearance: 38.4 mL/min (by C-G formula based on SCr of 1.03 mg/dL). Liver Function Tests: No results  for input(s): AST, ALT, ALKPHOS, BILITOT, PROT, ALBUMIN in the last 168 hours. No results for input(s): LIPASE, AMYLASE in the last 168 hours. No results for input(s): AMMONIA in the last 168 hours. Coagulation Profile: No results for input(s): INR, PROTIME in the last 168 hours. Cardiac Enzymes: No results for input(s): CKTOTAL, CKMB, CKMBINDEX, TROPONINI in the last 168 hours. BNP (last 3 results) No results for input(s): PROBNP in the last 8760 hours. HbA1C: No results for input(s): HGBA1C in the last 72 hours. CBG: No results for input(s): GLUCAP in the last 168 hours. Lipid Profile: No results for input(s): CHOL, HDL, LDLCALC, TRIG, CHOLHDL, LDLDIRECT in the last 72 hours. Thyroid Function Tests: Recent Labs    04/01/24 1634  TSH 1.012   Anemia Panel: No results for input(s): VITAMINB12, FOLATE, FERRITIN, TIBC, IRON, RETICCTPCT in the last 72 hours. Sepsis Labs: No results for input(s): PROCALCITON, LATICACIDVEN in the last 168 hours.  No results found for this or any previous visit (from the past 240 hours).  Radiology Studies:  CT PELVIS WO CONTRAST Result Date: 04/01/2024 EXAM: CT Pelvis, Without IV Contrast 04/01/2024 08:25:51 AM TECHNIQUE: Axial images were acquired through the pelvis without IV contrast. Reformatted images were reviewed. Automated exposure control, iterative reconstruction, and/or weight based adjustment of the mA/kV was utilized to reduce the radiation dose to as low as reasonably achievable. COMPARISON: AP radiograph of the pelvis dated 04/01/2024 and CT of the pelvis dated 12/11/2023. CLINICAL HISTORY: Hip trauma, fracture suspected, xray done. FINDINGS: BONES: New buckling of the cortex at the junction of the left femoral head and neck on the axial and coronal images, compatible with a nondisplaced subcapital femoral neck fracture. Status post orthopedic fixation of a basicervical and  intertrochanteric fracture, treated with an orthopedic screw within the femoral head and neck and an anchoring rod within the proximal femoral shaft. The orthopedic hardware is intact. The bony pelvis is intact. JOINTS: No dislocation. The joint spaces are normal. SOFT TISSUES: The soft tissues are unremarkable. INTRAPELVIC CONTENTS: Limited images of the intrapelvic contents demonstrate no acute abnormality. IMPRESSION: 1. New nondisplaced subcapital femoral neck fracture on the left 2. Status post orthopedic fixation of prior basicervical/intertrochanteric left femur fracture with intact hardware Electronically signed by: Evalene Coho MD 04/01/2024 08:37 AM EST RP Workstation: [id number]   DG Pelvis Portable Result Date: 04/01/2024 EXAM: 1 or 2 VIEW(S) XRAY OF THE PELVIS 04/01/2024 06:29:00 AM COMPARISON: CT of the pelvis 12/11/2023. CLINICAL HISTORY: fall FINDINGS: BONES AND JOINTS: Osteopenia is present. Left hip nailing hardware is similar in positioning. The intertrochanteric fracture noted on the prior study, which was in the early stages of healing, appears to have healed. New from the prior study, there is a subtle cortical step-off at the junction of the superior head and neck of the left femur suspicious for a new fracture such as a subcapital proximal left femoral fracture. There is no other evidence of fractures. Joint spaces are maintained. No AP evidence of pelvic fracture or diastasis. SOFT TISSUES: Calcific plaques in both proximal femoral arteries. IMPRESSION: 1. Subtle cortical step-off at the superior left femoral head-neck junction suspicious for a new subcapital proximal left femoral fracture. 2. Left hip nailing hardware in similar positioning. Electronically signed by: Francis Quam MD 04/01/2024 07:33 AM EST RP Workstation: [id number]   CT Cervical Spine Wo Contrast Result Date: 04/01/2024 EXAM: CT CERVICAL SPINE WITHOUT CONTRAST 04/01/2024 06:45:07 AM TECHNIQUE: CT of the  cervical spine was performed without the administration of intravenous contrast. Multiplanar reformatted images are provided for review. Automated exposure control, iterative reconstruction, and/or weight based adjustment of the mA/kV was utilized to reduce the radiation dose to as low as reasonably achievable. COMPARISON: Cervical spine CT 12/08/2023. CLINICAL HISTORY: Neck trauma (Age >= 65y). FINDINGS: CERVICAL SPINE: BONES AND ALIGNMENT: There is osteopenia without evidence of fractures or focal pathologic process. There is chronic narrowing and spur formation of the anterior atlantoaxial joint. Chronic minimal listhesis at C4-C5 likely degenerative. No traumatic or new alignment abnormality is seen. 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 DEGENERATIVE CHANGES: Degenerative changes are again noted, greatest at C5-C6 and C6-C7 where the discs are collapsed and there are small bidirectional endplate spurs. A mild chronic disc bulge is again noted at C3-C4, but again is nonstenosing. There are facet joint and uncinate spurs at most levels but no significant foraminal stenosis. No significant soft tissue or bony encroachment of the thecal sac is seen. SOFT TISSUES: No prevertebral soft tissue swelling. There are calcific plaques at the  carotid bifurcations. Lung apices show scarring changes without pneumothorax. IMPRESSION: 1. Osteopenia and degenerative change without evidence of fractures. 2. Atherosclerotic calcifications at the carotid bifurcations. Electronically signed by: Francis Quam MD 04/01/2024 07:26 AM EST RP Workstation: [id number]   CT HEAD WO CONTRAST ( ) Result Date: 04/01/2024 EXAM: CT HEAD WITHOUT CONTRAST 04/01/2024 06:45:07 AM TECHNIQUE: CT of the head was performed without the administration of intravenous contrast. Automated exposure control, iterative reconstruction, and/or weight based adjustment of the mA/kV was utilized to reduce the radiation dose to as  low as reasonably achievable. COMPARISON: Head CT from 12/08/2023. CLINICAL HISTORY: Head trauma, minor (Age >= 65y). Un witnessed fall. Currently on blood thinner medication. FINDINGS: BRAIN AND VENTRICLES: There are chronic lacunar infarcts of the junction of the right thalamus and internal capsule and in the superior aspect of the left cerebellar hemisphere. A chronic lacunar infarct is also noted in the right frontal white matter. There are dystrophic calcifications along the falx. There is moderately advanced cerebral and mild cerebellar atrophy, atrophic ventriculomegaly, and moderate to severe small vessel disease of the cerebral white matter. No acute hemorrhage. No cortical infarct. No extra-axial collection. No mass effect or midline shift. There is atherosclerosis in the siphons and distal vertebral arteries. No hyperdense central vessels. ORBITS: No acute abnormality. SINUSES: There is mucosal disease in the ethmoid air cells. Other sinuses, bilateral mastoid air cells, and middle ears are clear. SOFT TISSUES AND SKULL: There is a left parietal scalp contusion without a large hematoma. There are no depressed skull fractures. IMPRESSION: 1. No acute intracranial CT findings or depressed skull fractures. 2. Left parietal scalp contusion. 3. Moderately advanced cerebral and mild cerebellar atrophy with atrophic ventriculomegaly, and moderate to severe chronic small vessel ischemic disease of the cerebral white matter. Stable exam. 4. Chronic lacunar infarcts. 5. Intracranial atherosclerosis. Electronically signed by: Francis Quam MD 04/01/2024 07:17 AM EST RP Workstation: [id number]   DG Chest Port 1 View Result Date: 04/01/2024 EXAM: 1 VIEW(S) XRAY OF THE CHEST 04/01/2024 06:29:00 AM COMPARISON: Portable chest 11/04/2023. CLINICAL HISTORY: fall fall FINDINGS: LINES, TUBES AND DEVICES: Loop recorder device again in the left chest wall. LUNGS AND PLEURA: Advanced pulmonary fibrosis without evidence of a  focal or new infiltrate. No vascular congestion is seen. No substantial pleural effusion. No pneumothorax. HEART AND MEDIASTINUM: Mild cardiomegaly. Stable mediastinal contour with aortic tortuosity and atherosclerosis. BONES AND SOFT TISSUES: Osteopenia and thoracic spondylosis. Mild upper thoracic dextroscoliosis. Chronic wedging and kyphoplasty of a lower thoracic segment. IMPRESSION: 1. No acute cardiopulmonary findings. 2. Advanced pulmonary fibrosis without new superimposed infiltrate. 3. Mild cardiomegaly without vascular congestion. Electronically signed by: Francis Quam MD 04/01/2024 07:02 AM EST RP Workstation: [id number]   Scheduled Meds:  docusate sodium   100 mg Oral BID   enoxaparin  (LOVENOX ) injection  40 mg Subcutaneous Q24H   famotidine   20 mg Oral Daily   rosuvastatin   20 mg Oral Daily   tamsulosin   0.4 mg Oral QHS   Continuous Infusions:  sodium chloride      cefTRIAXone (ROCEPHIN)  IV Stopped (04/01/24 1538)     LOS: 1 day    Time spent: 50 mins    Darcel Dawley, MD [facility]   If 7PM-7AM, please contact night-coverage

## 2024-04-02 NOTE — Consult Note (Signed)
 Reason for Consult: unwitnessed, left hip pain Referring Physician: Haig, MD (Hospitalist)  Samuel Mata is an 89 y.o. male.  HPI: Samuel Mata is a 89 y.o. male with medical history significant of bronchiectasis, essential hypertension, history of stroke, nephrolithiasis, history of nocardiosis pulmonary presents after having a fall at his assisted living facility. He is accompanied by his daughter, Lavern.   He experienced an unwitnessed fall early this morning at around 5 AM. He used his facility's medical alert pendant to call for assistance and was found on the floor.     In the past few days, he has been leaning to the left and exhibited confusion, which is atypical for him. Normally, he is alert and oriented, with only mild confusion that could be attributed to sun downing. His daughter wonders if he had a possible stroke recently.   He has been experiencing swelling in his legs. His primary doctor recently increased his Lasix dosage to address this issue. He has been advised to wear compression hose, but he has not been compliant.   He is normally able to perform activities of daily living independently, including toileting, dressing, and feeding himself.   In the emergency department patient was noted to be mildly tachypneic with blood pressures elevated up to 165/78, and O2 saturations noted to be as low as 86% for which he was placed on 2 L nasal cannula oxygen.  Labs noted mild thrombocytosis platelet count 425.  Pelvic x-ray showed subtle cortical step-off at the superior left head neck junction suspicious for new subcapital proximal left femur fracture.  CT imaging of the head and cervical spine noted no acute intracranial abnormality or cervical fracture patient did have a left parietal scalp contusion noted with moderate to advanced cerebral and mild cerebellar atrophy and atrophic ventriculomegaly.  He has a recent history of trochanteric hip nailing for a ipsilateral left  3-part intertrochanteric proximal femur fracture in June of 2025.  Confused this morning wear mits to protect from pulling at catheter and IV lines.  Past Medical History:  Diagnosis Date   Bronchiectasis without complication (HCC)    Enlarged prostate    Hearing loss    History of kidney stones    Hypertension    Pulmonary nocardiosis 2021   treated with 6 month antibiotic course   Stroke (HCC) 2022   left frontoparietal   TIA (transient ischemic attack) 2019    Past Surgical History:  Procedure Laterality Date   APPENDECTOMY     CHOLECYSTECTOMY     INTRAMEDULLARY (IM) NAIL INTERTROCHANTERIC Left 11/01/2023   Procedure: FIXATION, FRACTURE, INTERTROCHANTERIC, WITH INTRAMEDULLARY ROD;  Surgeon: Celena Sharper, MD;  Location: [facility] OR;  Service: Orthopedics;  Laterality: Left;   LOOP RECORDER INSERTION N/A 01/15/2021   Procedure: LOOP RECORDER INSERTION;  Surgeon: Kelsie Agent, MD;  Location: [facility];  Service: Cardiovascular;  Laterality: N/A;   NOSE SURGERY     SPINE SURGERY     compressoin fx T12, kyphoplasty   SPLENECTOMY, TOTAL      Family History  Problem Relation Age of Onset   Lung disease Neg Hx     Social History:  reports that he quit smoking about 39 years ago. His smoking use included cigarettes. He started smoking about 69 years ago. He has a 30 pack-year smoking history. He has never used smokeless tobacco. He reports that he does not drink alcohol  and does not use drugs.  Allergies:  Allergies  Allergen Reactions   Codeine Other (See  Comments)    Unknown reaction Not documented at SNF    Medications: I have reviewed the patient's current medications. Scheduled:  docusate sodium   100 mg Oral BID   enoxaparin  (LOVENOX ) injection  40 mg Subcutaneous Q24H   famotidine   20 mg Oral Daily   rosuvastatin   20 mg Oral Daily   tamsulosin   0.4 mg Oral QHS    Results for orders placed or performed during the hospital encounter of 04/01/24 (from the  past 24 hours)  Urinalysis, Routine w reflex microscopic -Urine, Clean Catch     Status: Abnormal   Collection Time: 04/01/24 12:00 PM  Result Value Ref Range   Color, Urine YELLOW YELLOW   APPearance HAZY (A) CLEAR   Specific Gravity, Urine 1.013 1.005 - 1.030   pH 6.0 5.0 - 8.0   Glucose, UA NEGATIVE NEGATIVE mg/dL   Hgb urine dipstick SMALL (A) NEGATIVE   Bilirubin Urine NEGATIVE NEGATIVE   Ketones, ur NEGATIVE NEGATIVE mg/dL   Protein, ur NEGATIVE NEGATIVE mg/dL   Nitrite POSITIVE (A) NEGATIVE   Leukocytes,Ua LARGE (A) NEGATIVE   RBC / HPF 0-5 0 - 5 RBC/hpf   WBC, UA 11-20 0 - 5 WBC/hpf   Bacteria, UA RARE (A) NONE SEEN   Squamous Epithelial / HPF 0-5 0 - 5 /HPF  D-dimer, quantitative     Status: Abnormal   Collection Time: 04/01/24 12:30 PM  Result Value Ref Range   D-Dimer, Quant 5.88 (H) 0.00 - 0.50 ug/mL-FEU  Brain natriuretic peptide     Status: Abnormal   Collection Time: 04/01/24  4:34 PM  Result Value Ref Range   B Natriuretic Peptide 165.2 (H) 0.0 - 100.0 pg/mL  TSH     Status: None   Collection Time: 04/01/24  4:34 PM  Result Value Ref Range   TSH 1.012 0.350 - 4.500 uIU/mL  Basic metabolic panel     Status: Abnormal   Collection Time: 04/02/24  4:18 AM  Result Value Ref Range   Sodium 141 135 - 145 mmol/L   Potassium 3.7 3.5 - 5.1 mmol/L   Chloride 103 98 - 111 mmol/L   CO2 28 22 - 32 mmol/L   Glucose, Bld 81 70 - 99 mg/dL   BUN 22 8 - 23 mg/dL   Creatinine, Ser 8.96 0.61 - 1.24 mg/dL   Calcium  8.7 (L) 8.9 - 10.3 mg/dL   GFR, Estimated >39 >39 mL/min   Anion gap 10 5 - 15  CBC     Status: Abnormal   Collection Time: 04/02/24  4:18 AM  Result Value Ref Range   WBC 9.1 4.0 - 10.5 K/uL   RBC 4.00 (L) 4.22 - 5.81 MIL/uL   Hemoglobin 12.3 (L) 13.0 - 17.0 g/dL   HCT 61.2 (L) 60.9 - 47.9 %   MCV 96.8 80.0 - 100.0 fL   MCH 30.8 26.0 - 34.0 pg   MCHC 31.8 30.0 - 36.0 g/dL   RDW 86.7 88.4 - 84.4 %   Platelets 375 150 - 400 K/uL   nRBC 0.0 0.0 - 0.2 %   Type and screen [facility]     Status: None   Collection Time: 04/02/24  4:18 AM  Result Value Ref Range   ABO/RH(D) B POS    Antibody Screen NEG    Sample Expiration      04/05/2024,2359 Performed at [facility], [address] [place], [place] [address]      X-ray: EXAM: 1 or  2 VIEW(S) XRAY OF THE PELVIS 04/01/2024 06:29:00 AM   COMPARISON: CT of the pelvis 12/11/2023.   CLINICAL HISTORY: fall   FINDINGS:   BONES AND JOINTS: Osteopenia is present. Left hip nailing hardware is similar in positioning.   The intertrochanteric fracture noted on the prior study, which was in the early stages of healing, appears to have healed.   New from the prior study, there is a subtle cortical step-off at the junction of the superior head and neck of the left femur suspicious for a new fracture such as a subcapital proximal left femoral fracture.   There is no other evidence of fractures. Joint spaces are maintained. No AP evidence of pelvic fracture or diastasis.   SOFT TISSUES: Calcific plaques in both proximal femoral arteries.   IMPRESSION: 1. Subtle cortical step-off at the superior left femoral head-neck junction suspicious for a new subcapital proximal left femoral fracture. 2. Left hip nailing hardware in similar positioning.   Electronically signed by: Francis Quam MD 04/01/2024 07:33 AM EST RP  EXAM: CT Pelvis, Without IV Contrast 04/01/2024 08:25:51 AM   TECHNIQUE: Axial images were acquired through the pelvis without IV contrast. Reformatted images were reviewed. Automated exposure control, iterative reconstruction, and/or weight based adjustment of the mA/kV was utilized to reduce the radiation dose to as low as reasonably achievable.   COMPARISON: AP radiograph of the pelvis dated 04/01/2024 and CT of the pelvis dated 12/11/2023.   CLINICAL HISTORY: Hip trauma, fracture suspected, xray done.   FINDINGS:   BONES: New  buckling of the cortex at the junction of the left femoral head and neck on the axial and coronal images, compatible with a nondisplaced subcapital femoral neck fracture. Status post orthopedic fixation of a basicervical and intertrochanteric fracture, treated with an orthopedic screw within the femoral head and neck and an anchoring rod within the proximal femoral shaft. The orthopedic hardware is intact. The bony pelvis is intact.   JOINTS: No dislocation. The joint spaces are normal.   SOFT TISSUES: The soft tissues are unremarkable.   INTRAPELVIC CONTENTS: Limited images of the intrapelvic contents demonstrate no acute abnormality.   IMPRESSION: 1. New nondisplaced subcapital femoral neck fracture on the left 2. Status post orthopedic fixation of prior basicervical/intertrochanteric left femur fracture with intact hardware   Electronically signed by: Evalene Coho MD 04/01/2024 08:37 AM EST RP  ROS: As per admitting HPI Recent hip fracture Resides in nursing facility Recent confusion  Recent bilateral LE edema  Blood pressure (!) 141/69, pulse 78, temperature 98.3 F (36.8 C), resp. rate 17, height 5' 7 (1.702 m), weight 57 kg, SpO2 91%.  Physical Exam: Constitutional: Early male who appears to be lethargic, but in no acute distress, confusion noted this morning Eyes: PERRL, lids and conjunctivae normal ENMT: Mucous membranes are moist.  Hard of hearing Neck: normal, supple.  JVD present Respiratory: clear to auscultation bilaterally, no wheezing, no crackles. Normal respiratory effort. No accessory muscle use.  Cardiovascular: Regular rate and rhythm, no murmurs / rubs / gallops. Bilateral 2+ LE edema with mild erythema.  2+ pedal pulses. No carotid bruits.  Abdomen: no tenderness, no masses palpated. No hepatosplenomegaly. Bowel sounds positive.  Musculoskeletal: no clubbing / cyanosis.  Laying in bed this morning with both hips and knees flexed I am able to get  him to actively move his LLE without apparent pain, nursing confirms that he has been moving his legs around in the bed, perhaps confounded by his current level of confusion  Skin: Scalp bruising noted of posterior parietal lobe on the left-hand side Neurologic: CN 2-12 grossly intact. Sensation intact, DTR normal. Strength 5/5 in all 4.  Psychiatric: Patient seems somewhat confused. Alert and oriented person and place..   Assessment/Plan: Recent history (10/30/2023) of ORIF of left intertrochanteric hip fracture Unwitnessed fall with radiographic findings of new subcapital femoral neck fracture  Plan: I will discuss this case with Dr. Celena to determine treatment option of non-operative management versus augmented percutaneous screw fixation verus conversion to hip hemiarthroplasty NPO for now. Diet order may change based on treatment plan Minimize narcotics due to current confusion  Samuel Mata 04/02/2024, 6:12 AM

## 2024-04-02 NOTE — TOC CAGE-AID Note (Signed)
 Transition of Care [facility]) - CAGE-AID Screening   Patient Details  Name: Hanson Medeiros MRN: [id number] Date of Birth: 1930-06-02  Transition of Care [facility]) CM/SW Contact:    Masey Scheiber E Basim Bartnik, LCSW Phone Number: 04/02/2024, 11:56 AM   Clinical Narrative: Disoriented x 4. No SA noted.   CAGE-AID Screening: Substance Abuse Screening unable to be completed due to: : Patient unable to participate

## 2024-04-02 NOTE — Consult Note (Addendum)
 Orthopaedic Trauma Service (OTS) Consult   Patient ID: Samuel Mata: [id number] DOB/AGE: 1930/07/17 89 y.o.   Reason for Consult: Left femoral neck fracture Referring Physician: Donnice Car, MD   HPI: Samuel Mata is an 89 y.o. male who is about 4 months status post IM nailing of a left hip fracture.  Patient was at his assisted living facility yesterday when he sustained a fall which was unwitnessed.  He was brought to [facility] for acute metabolic encephalopathy/confusion also was complaining of some left hip pain.  X-rays were obtained which showed questionable fracture of the left femoral neck.  CT scan was obtained which does show some cortical irregularity but no real distinct fracture lines.  Orthopedics consulted for further evaluation.  Patient seen and evaluated 5 N. 17 he is confused but pleasant and awake.  Was able to discuss with patient's daughter.  He has been residing at his assisted living facility since getting out of inpatient rehab after his left hip fracture.  He has not been ambulatory with a walker but has been able to transfer independently to his wheelchair, bed and when toileting.  He is also able to propel himself with his arms and legs around the assisted living facility.  Family is hopeful that he can regain this level of function to return back to his assisted living facility  Past Medical History:  Diagnosis Date   Bronchiectasis without complication (HCC)    Enlarged prostate    Hearing loss    History of kidney stones    Hypertension    Pulmonary nocardiosis 2021   treated with 6 month antibiotic course   Stroke (HCC) 2022   left frontoparietal   TIA (transient ischemic attack) 2019    Past Surgical History:  Procedure Laterality Date   APPENDECTOMY     CHOLECYSTECTOMY     INTRAMEDULLARY (IM) NAIL INTERTROCHANTERIC Left 11/01/2023   Procedure: FIXATION, FRACTURE, INTERTROCHANTERIC, WITH INTRAMEDULLARY ROD;  Surgeon:  Celena Sharper, MD;  Location: [facility] OR;  Service: Orthopedics;  Laterality: Left;   LOOP RECORDER INSERTION N/A 01/15/2021   Procedure: LOOP RECORDER INSERTION;  Surgeon: Kelsie Agent, MD;  Location: [facility] INVASIVE CV LAB;  Service: Cardiovascular;  Laterality: N/A;   NOSE SURGERY     SPINE SURGERY     compressoin fx T12, kyphoplasty   SPLENECTOMY, TOTAL      Family History  Problem Relation Age of Onset   Lung disease Neg Hx     Social History:  reports that he quit smoking about 39 years ago. His smoking use included cigarettes. He started smoking about 69 years ago. He has a 30 pack-year smoking history. He has never used smokeless tobacco. He reports that he does not drink alcohol  and does not use drugs. Allergies:  Allergies  Allergen Reactions   Codeine Other (See Comments)    Unknown reaction Not documented at SNF    Medications: I have reviewed the patient's current medications. Current Meds  Medication Sig   acetaminophen  (TYLENOL ) 325 MG tablet Take 650 mg by mouth in the morning, at noon, and at bedtime.   clopidogrel  (PLAVIX ) 75 MG tablet Take 1 tablet (75 mg total) by mouth daily.   docusate sodium  (COLACE) 100 MG capsule Take 1 capsule (100 mg total) by mouth 2 (two) times daily.   famotidine  (PEPCID ) 20 MG tablet Take 1 tablet (20 mg total) by mouth daily.   furosemide (LASIX) 20 MG tablet Take 20 mg  by mouth every Monday, Wednesday, and Friday.   melatonin 3 MG TABS tablet Take 1 tablet (3 mg total) by mouth at bedtime.   Multiple Vitamin (MULTIVITAMIN WITH MINERALS) TABS tablet Take 1 tablet by mouth daily.   polyethylene glycol powder (GLYCOLAX /MIRALAX ) 17 GM/SCOOP powder Take 17 g by mouth in the morning and at bedtime.   rosuvastatin  (CRESTOR ) 20 MG tablet Take 1 tablet (20 mg total) by mouth daily.   tamsulosin  (FLOMAX ) 0.4 MG CAPS capsule Take 1 capsule (0.4 mg total) by mouth daily after supper. (Patient taking differently: Take 0.4 mg by mouth at bedtime.)    Vitamin D , Ergocalciferol , (DRISDOL ) 1.25 MG (50000 UNIT) CAPS capsule Take 1 capsule (50,000 Units total) by mouth every 7 (seven) days. (Patient taking differently: Take 50,000 Units by mouth every Thursday.)     Results for orders placed or performed during the hospital encounter of 04/01/24 (from the past 48 hours)  CBC with Differential     Status: Abnormal   Collection Time: 04/01/24  5:51 AM  Result Value Ref Range   WBC 9.9 4.0 - 10.5 K/uL   RBC 4.19 (L) 4.22 - 5.81 MIL/uL   Hemoglobin 13.1 13.0 - 17.0 g/dL   HCT 60.2 60.9 - 47.9 %   MCV 94.7 80.0 - 100.0 fL   MCH 31.3 26.0 - 34.0 pg   MCHC 33.0 30.0 - 36.0 g/dL   RDW 86.6 88.4 - 84.4 %   Platelets 425 (H) 150 - 400 K/uL   nRBC 0.0 0.0 - 0.2 %   Neutrophils Relative % 70 %   Neutro Abs 7.0 1.7 - 7.7 K/uL   Lymphocytes Relative 10 %   Lymphs Abs 1.0 0.7 - 4.0 K/uL   Monocytes Relative 16 %   Monocytes Absolute 1.5 (H) 0.1 - 1.0 K/uL   Eosinophils Relative 2 %   Eosinophils Absolute 0.2 0.0 - 0.5 K/uL   Basophils Relative 1 %   Basophils Absolute 0.1 0.0 - 0.1 K/uL   Immature Granulocytes 1 %   Abs Immature Granulocytes 0.05 0.00 - 0.07 K/uL    Comment: Performed at [facility], [address], [place], [place] [address]  Basic metabolic panel     Status: None   Collection Time: 04/01/24  5:51 AM  Result Value Ref Range   Sodium 138 135 - 145 mmol/L   Potassium 3.7 3.5 - 5.1 mmol/L   Chloride 103 98 - 111 mmol/L   CO2 24 22 - 32 mmol/L   Glucose, Bld 88 70 - 99 mg/dL    Comment: Glucose reference range applies only to samples taken after fasting for at least 8 hours.   BUN 22 8 - 23 mg/dL   Creatinine, Ser 9.03 0.61 - 1.24 mg/dL   Calcium  9.0 8.9 - 10.3 mg/dL   GFR, Estimated >39 >39 mL/min    Comment: (NOTE) Calculated using the CKD-EPI Creatinine Equation (2021)    Anion gap 11 5 - 15    Comment: Performed at [facility], [address] [place], [place] [address]  ABO/Rh     Status: None    Collection Time: 04/01/24  5:51 AM  Result Value Ref Range   ABO/RH(D)      B POS Performed at [facility], [address] [place], [place] [address]   Urinalysis, Routine w reflex microscopic -Urine, Clean Catch     Status: Abnormal   Collection Time: 04/01/24 12:00 PM  Result Value Ref  Range   Color, Urine YELLOW YELLOW   APPearance HAZY (A) CLEAR   Specific Gravity, Urine 1.013 1.005 - 1.030   pH 6.0 5.0 - 8.0   Glucose, UA NEGATIVE NEGATIVE mg/dL   Hgb urine dipstick SMALL (A) NEGATIVE   Bilirubin Urine NEGATIVE NEGATIVE   Ketones, ur NEGATIVE NEGATIVE mg/dL   Protein, ur NEGATIVE NEGATIVE mg/dL   Nitrite POSITIVE (A) NEGATIVE   Leukocytes,Ua LARGE (A) NEGATIVE   RBC / HPF 0-5 0 - 5 RBC/hpf   WBC, UA 11-20 0 - 5 WBC/hpf   Bacteria, UA RARE (A) NONE SEEN   Squamous Epithelial / HPF 0-5 0 - 5 /HPF    Comment: Performed at [facility], [address] [place], [place] [address]  D-dimer, quantitative     Status: Abnormal   Collection Time: 04/01/24 12:30 PM  Result Value Ref Range   D-Dimer, Quant 5.88 (H) 0.00 - 0.50 ug/mL-FEU    Comment: (NOTE) At the manufacturer cut-off value of 0.5 g/mL FEU, this assay has a negative predictive value of 95-100%.This assay is intended for use in conjunction with a clinical pretest probability (PTP) assessment model to exclude pulmonary embolism (PE) and deep venous thrombosis (DVT) in outpatients suspected of PE or DVT. Results should be correlated with clinical presentation. Performed at [facility], [address], [place], [place] [address]   Brain natriuretic peptide     Status: Abnormal   Collection Time: 04/01/24  4:34 PM  Result Value Ref Range   B Natriuretic Peptide 165.2 (H) 0.0 - 100.0 pg/mL    Comment: Performed at [facility], [address] [place], [place] [address]  TSH     Status: None   Collection Time: 04/01/24  4:34 PM  Result Value Ref Range   TSH 1.012 0.350 - 4.500 uIU/mL     Comment: Performed by a 3rd Generation assay with a functional sensitivity of <=0.01 uIU/mL. Performed at [facility], [address] [place], [place] [address]   Basic metabolic panel     Status: Abnormal   Collection Time: 04/02/24  4:18 AM  Result Value Ref Range   Sodium 141 135 - 145 mmol/L   Potassium 3.7 3.5 - 5.1 mmol/L   Chloride 103 98 - 111 mmol/L   CO2 28 22 - 32 mmol/L   Glucose, Bld 81 70 - 99 mg/dL    Comment: Glucose reference range applies only to samples taken after fasting for at least 8 hours.   BUN 22 8 - 23 mg/dL   Creatinine, Ser 8.96 0.61 - 1.24 mg/dL   Calcium  8.7 (L) 8.9 - 10.3 mg/dL   GFR, Estimated >39 >39 mL/min    Comment: (NOTE) Calculated using the CKD-EPI Creatinine Equation (2021)    Anion gap 10 5 - 15    Comment: Performed at [facility], [address] [place], [place] [address]  CBC     Status: Abnormal   Collection Time: 04/02/24  4:18 AM  Result Value Ref Range   WBC 9.1 4.0 - 10.5 K/uL   RBC 4.00 (L) 4.22 - 5.81 MIL/uL   Hemoglobin 12.3 (L) 13.0 - 17.0 g/dL   HCT 61.2 (L) 60.9 - 47.9 %   MCV 96.8 80.0 - 100.0 fL   MCH 30.8 26.0 - 34.0 pg   MCHC 31.8 30.0 - 36.0 g/dL   RDW 86.7 88.4 - 84.4 %   Platelets 375 150 - 400 K/uL  nRBC 0.0 0.0 - 0.2 %    Comment: Performed at [facility], [address] [place], [place] [address]  Type and screen [facility]     Status: None   Collection Time: 04/02/24  4:18 AM  Result Value Ref Range   ABO/RH(D) B POS    Antibody Screen NEG    Sample Expiration      04/05/2024,2359 Performed at [facility], [address], [place], [place] [address]     CT PELVIS WO CONTRAST Result Date: 04/01/2024 EXAM: CT Pelvis, Without IV Contrast 04/01/2024 08:25:51 AM TECHNIQUE: Axial images were acquired through the pelvis without IV contrast. Reformatted images were reviewed. Automated exposure control, iterative reconstruction, and/or weight based adjustment of  the mA/kV was utilized to reduce the radiation dose to as low as reasonably achievable. COMPARISON: AP radiograph of the pelvis dated 04/01/2024 and CT of the pelvis dated 12/11/2023. CLINICAL HISTORY: Hip trauma, fracture suspected, xray done. FINDINGS: BONES: New buckling of the cortex at the junction of the left femoral head and neck on the axial and coronal images, compatible with a nondisplaced subcapital femoral neck fracture. Status post orthopedic fixation of a basicervical and intertrochanteric fracture, treated with an orthopedic screw within the femoral head and neck and an anchoring rod within the proximal femoral shaft. The orthopedic hardware is intact. The bony pelvis is intact. JOINTS: No dislocation. The joint spaces are normal. SOFT TISSUES: The soft tissues are unremarkable. INTRAPELVIC CONTENTS: Limited images of the intrapelvic contents demonstrate no acute abnormality. IMPRESSION: 1. New nondisplaced subcapital femoral neck fracture on the left 2. Status post orthopedic fixation of prior basicervical/intertrochanteric left femur fracture with intact hardware Electronically signed by: Evalene Coho MD 04/01/2024 08:37 AM EST RP Workstation: [id number]   DG Pelvis Portable Result Date: 04/01/2024 EXAM: 1 or 2 VIEW(S) XRAY OF THE PELVIS 04/01/2024 06:29:00 AM COMPARISON: CT of the pelvis 12/11/2023. CLINICAL HISTORY: fall FINDINGS: BONES AND JOINTS: Osteopenia is present. Left hip nailing hardware is similar in positioning. The intertrochanteric fracture noted on the prior study, which was in the early stages of healing, appears to have healed. New from the prior study, there is a subtle cortical step-off at the junction of the superior head and neck of the left femur suspicious for a new fracture such as a subcapital proximal left femoral fracture. There is no other evidence of fractures. Joint spaces are maintained. No AP evidence of pelvic fracture or diastasis. SOFT TISSUES: Calcific  plaques in both proximal femoral arteries. IMPRESSION: 1. Subtle cortical step-off at the superior left femoral head-neck junction suspicious for a new subcapital proximal left femoral fracture. 2. Left hip nailing hardware in similar positioning. Electronically signed by: Francis Quam MD 04/01/2024 07:33 AM EST RP Workstation: [id number]   CT Cervical Spine Wo Contrast Result Date: 04/01/2024 EXAM: CT CERVICAL SPINE WITHOUT CONTRAST 04/01/2024 06:45:07 AM TECHNIQUE: CT of the cervical spine was performed without the administration of intravenous contrast. Multiplanar reformatted images are provided for review. Automated exposure control, iterative reconstruction, and/or weight based adjustment of the mA/kV was utilized to reduce the radiation dose to as low as reasonably achievable. COMPARISON: Cervical spine CT 12/08/2023. CLINICAL HISTORY: Neck trauma (Age >= 65y). FINDINGS: CERVICAL SPINE: BONES AND ALIGNMENT: There is osteopenia without evidence of fractures or focal pathologic process. There is chronic narrowing and spur formation of the anterior atlantoaxial joint. Chronic minimal listhesis at C4-C5 likely degenerative. No traumatic or new alignment abnormality is seen. 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 DEGENERATIVE CHANGES: Degenerative changes are again noted, greatest at C5-C6 and C6-C7 where the discs are collapsed and there are small bidirectional endplate spurs. A mild chronic disc bulge is again noted at C3-C4, but again is nonstenosing. There are facet joint and uncinate spurs at most levels but no significant foraminal stenosis. No significant soft tissue or bony encroachment of the thecal sac is seen. SOFT TISSUES: No prevertebral soft tissue swelling. There are calcific plaques at the carotid bifurcations. Lung apices show scarring changes without pneumothorax. IMPRESSION: 1. Osteopenia and degenerative change without evidence of fractures. 2. Atherosclerotic  calcifications at the carotid bifurcations. Electronically signed by: Francis Quam MD 04/01/2024 07:26 AM EST RP Workstation: [id number]   CT HEAD WO CONTRAST ( ) Result Date: 04/01/2024 EXAM: CT HEAD WITHOUT CONTRAST 04/01/2024 06:45:07 AM TECHNIQUE: CT of the head was performed without the administration of intravenous contrast. Automated exposure control, iterative reconstruction, and/or weight based adjustment of the mA/kV was utilized to reduce the radiation dose to as low as reasonably achievable. COMPARISON: Head CT from 12/08/2023. CLINICAL HISTORY: Head trauma, minor (Age >= 65y). Un witnessed fall. Currently on blood thinner medication. FINDINGS: BRAIN AND VENTRICLES: There are chronic lacunar infarcts of the junction of the right thalamus and internal capsule and in the superior aspect of the left cerebellar hemisphere. A chronic lacunar infarct is also noted in the right frontal white matter. There are dystrophic calcifications along the falx. There is moderately advanced cerebral and mild cerebellar atrophy, atrophic ventriculomegaly, and moderate to severe small vessel disease of the cerebral white matter. No acute hemorrhage. No cortical infarct. No extra-axial collection. No mass effect or midline shift. There is atherosclerosis in the siphons and distal vertebral arteries. No hyperdense central vessels. ORBITS: No acute abnormality. SINUSES: There is mucosal disease in the ethmoid air cells. Other sinuses, bilateral mastoid air cells, and middle ears are clear. SOFT TISSUES AND SKULL: There is a left parietal scalp contusion without a large hematoma. There are no depressed skull fractures. IMPRESSION: 1. No acute intracranial CT findings or depressed skull fractures. 2. Left parietal scalp contusion. 3. Moderately advanced cerebral and mild cerebellar atrophy with atrophic ventriculomegaly, and moderate to severe chronic small vessel ischemic disease of the cerebral white matter. Stable exam.  4. Chronic lacunar infarcts. 5. Intracranial atherosclerosis. Electronically signed by: Francis Quam MD 04/01/2024 07:17 AM EST RP Workstation: [id number]   DG Chest Port 1 View Result Date: 04/01/2024 EXAM: 1 VIEW(S) XRAY OF THE CHEST 04/01/2024 06:29:00 AM COMPARISON: Portable chest 11/04/2023. CLINICAL HISTORY: fall fall FINDINGS: LINES, TUBES AND DEVICES: Loop recorder device again in the left chest wall. LUNGS AND PLEURA: Advanced pulmonary fibrosis without evidence of a focal or new infiltrate. No vascular congestion is seen. No substantial pleural effusion. No pneumothorax. HEART AND MEDIASTINUM: Mild cardiomegaly. Stable mediastinal contour with aortic tortuosity and atherosclerosis. BONES AND SOFT TISSUES: Osteopenia and thoracic spondylosis. Mild upper thoracic dextroscoliosis. Chronic wedging and kyphoplasty of a lower thoracic segment. IMPRESSION: 1. No acute cardiopulmonary findings. 2. Advanced pulmonary fibrosis without new superimposed infiltrate. 3. Mild cardiomegaly without vascular congestion. Electronically signed by: Francis Quam MD 04/01/2024 07:02 AM EST RP Workstation: [id number]    Intake/Output      11/16 0701 11/17 0700 11/17 0701 11/18 0700   P.O. 80 240   IV Piggyback 100    Total Intake(mL/kg) 180 (3.2) 240 (4.2)   Urine (mL/kg/hr) 200 (0.1)    Total Output 200  Net -20 +240        Urine Occurrence 1 x       Review of Systems  Unable to perform ROS: Mental acuity    Blood pressure (!) 143/65, pulse 69, temperature 98.2 F (36.8 C), resp. rate 17, height 5' 7 (1.702 m), weight 57 kg, SpO2 96%. Physical Exam Gen: sitting up in bed, awake, confused but pleasant.  Thought daughter was wife  Lungs: unlabored Cardiac: reg  Left Lower extremity   No acute deformity to left leg  Surgical wounds well healed   No pain with passive manipulation of left hip   No pain with axial loading or log rolling of left hip   He was able to actively move his hip and  knee from flexed position into an extended position without difficulty or pain   Ext warm   + DP pulse  Distal motor and sensory functions grossly intact   Assessment/Plan:  89 y/o male s/p fall, 4 months s/p IMN L hip fracture   -fall  -  new L subcapital femoral neck fracture   Hip fracture appears healed  No appreciable collapse of the femoral head or neck  Fracture pattern appears stable  No plan for surgical intervention  Weight-bear as tolerated left leg, no range of motion restrictions   PT and OT consults  [facility] consult for SNF  ?  If we may be able to get him back to his baseline function in a couple of days  - acute metabolic encephalopathy   UA suggestive of UTI--> on rocephin   Delirium precautions   Continue per medicine   - Pain management:  No narcotics  Utilize nonnarcotic modalities  - - Dispo:  Continue with current care  Therapy evaluations    Francis MICAEL Mt, PA-C [phone] (C) 04/02/2024, 6:22 PM  [facility] [address] [place] [address] [phone] (F)    After 5pm and on the weekends please log on to Amion, go to orthopaedics and the look under the [facility] Call for the provider(s) on call. You can also call our office at [phone] and then follow the prompts to be connected to the call team.

## 2024-04-03 ENCOUNTER — Encounter (HOSPITAL_COMMUNITY): Payer: Self-pay | Admitting: Internal Medicine

## 2024-04-03 DIAGNOSIS — S72012A Unspecified intracapsular fracture of left femur, initial encounter for closed fracture: Secondary | ICD-10-CM | POA: Diagnosis not present

## 2024-04-03 MED ORDER — HALOPERIDOL LACTATE 5 MG/ML IJ SOLN
1.0000 mg | Freq: Once | INTRAMUSCULAR | Status: AC | PRN
  Administered 2024-04-03: 1 mg via INTRAVENOUS
  Filled 2024-04-03: qty 1

## 2024-04-03 MED ORDER — OLANZAPINE 10 MG IM SOLR
2.5000 mg | Freq: Once | INTRAMUSCULAR | Status: DC | PRN
  Filled 2024-04-03 (×2): qty 10

## 2024-04-03 NOTE — Progress Notes (Signed)
 Pt. Noncompliant with cardiac monitoring CCMD and MD notified

## 2024-04-03 NOTE — Evaluation (Signed)
 Occupational Therapy Evaluation Patient Details Name: Samuel Mata MRN: [id number] DOB: January 05, 1931 Today's Date: 04/03/2024   History of Present Illness   Pt is 89 yo presenting to [facility] due to a fall and metabolic encephalopathy/confusion. Pt is 4 months status post IM nailing of L Hip. CT with findings of cortical irregularity but no distinct fracture lines. PMH: bronchiectasis, HTN, CVA, nephrolithiasis, nocardiosis pulmonary.     Clinical Impressions Pt currently requires up to Mod A for bed mobility and Min A +2 for functional mobility OOB. Pt requires up to Max A for ADL task engagement. Pt is from ALF and required assistance to complete functional mobility and ADLs from staff at baseline. Pt with impaired cognition. Pt is primarily limited by cognitive impairments, generalized weakness, and unsteadiness on feet. OT to continue to follow Pt acutely to facilitate progress towards goals. Patient will benefit from continued inpatient follow up therapy, <3 hours/day.      If plan is discharge home, recommend the following:   A lot of help with walking and/or transfers;A little help with bathing/dressing/bathroom;Assistance with cooking/housework;Assistance with feeding;Direct supervision/assist for medications management;Direct supervision/assist for financial management;Assist for transportation;Help with stairs or ramp for entrance;Supervision due to cognitive status     Functional Status Assessment   Patient has had a recent decline in their functional status and/or demonstrates limited ability to make significant improvements in function in a reasonable and predictable amount of time     Equipment Recommendations   None recommended by OT     Recommendations for Other Services         Precautions/Restrictions   Precautions Precautions: Fall Recall of Precautions/Restrictions: Impaired Restrictions Weight Bearing Restrictions Per Provider Order: No Other  Position/Activity Restrictions: WBAT     Mobility Bed Mobility Overal bed mobility: Needs Assistance Bed Mobility: Supine to Sit, Sit to Supine     Supine to sit: Mod assist Sit to supine: Mod assist, +2 for safety/equipment   General bed mobility comments: Mod A for trunk to midline and Mod A for LE back to bed. +2 present for safety due to pt cognition    Transfers Overall transfer level: Needs assistance Equipment used: Rolling walker (2 wheels) Transfers: Sit to/from Stand Sit to Stand: Min assist, +2 safety/equipment, +2 physical assistance           General transfer comment: Pt requried verbal cues to initiate and sequence steps. Min A for initial movement rising from bed to stand. Min A for weight shift to take steps to L HOB. Occassional posterior lean in standing but able to correct with Min A. Max A for management of RW to the side with steps      Balance Overall balance assessment: Needs assistance Sitting-balance support: Bilateral upper extremity supported, Feet supported Sitting balance-Leahy Scale: Fair Sitting balance - Comments: CGA in sitting. Initially Min to Mod A for trunk to midline and square hips on EOB Postural control: Left lateral lean, Posterior lean Standing balance support: Bilateral upper extremity supported, During functional activity, Reliant on assistive device for balance Standing balance-Leahy Scale: Poor Standing balance comment: Dependent on RW and 2 person Min A for balance with BUE support on RW                           ADL either performed or assessed with clinical judgement   ADL Overall ADL's : Needs assistance/impaired Eating/Feeding: Supervision/ safety;Sitting   Grooming: Contact guard assist  Upper Body Bathing: Minimal assistance;Sitting   Lower Body Bathing: Maximal assistance;Sitting/lateral leans   Upper Body Dressing : Minimal assistance;Sitting   Lower Body Dressing: Maximal  assistance;Sitting/lateral leans   Toilet Transfer: Minimal assistance;+2 for physical assistance;Cueing for sequencing;BSC/3in1;Rolling walker (2 wheels)   Toileting- Clothing Manipulation and Hygiene: Maximal assistance;Sit to/from stand               Vision   Vision Assessment?: No apparent visual deficits     Perception         Praxis         Pertinent Vitals/Pain Pain Assessment Pain Assessment: Faces Faces Pain Scale: Hurts a little bit Pain Location: L hip with mobility Pain Descriptors / Indicators: Discomfort, Grimacing Pain Intervention(s): Monitored during session, Limited activity within patient's tolerance, Repositioned     Extremity/Trunk Assessment Upper Extremity Assessment Upper Extremity Assessment: Defer to OT evaluation   Lower Extremity Assessment Lower Extremity Assessment: Generalized weakness;LLE deficits/detail LLE Deficits / Details: IMN 4 months ago   Cervical / Trunk Assessment Cervical / Trunk Assessment: Kyphotic   Communication Communication Communication: Impaired Factors Affecting Communication: Hearing impaired;Reduced clarity of speech   Cognition     Cognition: Cognition impaired   Orientation impairments: Place, Time, Situation Awareness: Intellectual awareness impaired, Online awareness impaired Memory impairment (select all impairments): Short-term memory, Working civil service fast streamer, [employer] functioning impairment (select all impairments): Problem solving, Reasoning, Sequencing, Organization OT - Cognition Comments: Pt pleasant and agreeable to OT session. Pt benefited from explanations of tasks being completed and step-by-step cues. Pt oriented to self. Impaired executive functioning skills                         Cueing  General Comments      no signs/symptoms of cardiac/respiratory distress during session.   Exercises     Shoulder Instructions      Home Living Family/patient  expects to be discharged to:: Assisted living                                        Prior Functioning/Environment Prior Level of Function : Needs assist  Cognitive Assist : Mobility (cognitive);ADLs (cognitive)           Mobility Comments: per pt chart he was transferring with RW to W/C and using W/C for mobility ADLs Comments: assist at ALF    OT Problem List: Decreased strength;Decreased activity tolerance;Impaired balance (sitting and/or standing);Decreased cognition;Decreased safety awareness;Decreased knowledge of use of DME or AE;Pain   OT Treatment/Interventions: Self-care/ADL training;Therapeutic exercise;Energy conservation;DME and/or AE instruction;Therapeutic activities;Patient/family education;Balance training      OT Goals(Current goals can be found in the care plan section)   Acute Rehab OT Goals Patient Stated Goal: none stated this session OT Goal Formulation: Patient unable to participate in goal setting Time For Goal Achievement: 04/17/24 Potential to Achieve Goals: Fair ADL Goals Pt Will Perform Grooming: with set-up;sitting Pt Will Perform Lower Body Bathing: with min assist Pt Will Perform Lower Body Dressing: with min assist Pt Will Transfer to Toilet: with contact guard assist;bedside commode   OT Frequency:  Min 1X/week    Co-evaluation PT/OT/SLP Co-Evaluation/Treatment: Yes Reason for Co-Treatment: Necessary to address cognition/behavior during functional activity;To address functional/ADL transfers;For patient/therapist safety PT goals addressed during session: Mobility/safety with mobility;Balance;Proper use of DME OT goals addressed during session: ADL's and self-care;Proper use  of Adaptive equipment and DME      AM-PAC OT 6 Clicks Daily Activity     Outcome Measure Help from another person eating meals?: A Little Help from another person taking care of personal grooming?: A Little Help from another person toileting,  which includes using toliet, bedpan, or urinal?: A Lot Help from another person bathing (including washing, rinsing, drying)?: A Lot Help from another person to put on and taking off regular upper body clothing?: A Little Help from another person to put on and taking off regular lower body clothing?: A Lot 6 Click Score: 15   End of Session Equipment Utilized During Treatment: Rolling walker (2 wheels) Nurse Communication: Mobility status  Activity Tolerance: Patient tolerated treatment well Patient left: in bed;with call bell/phone within reach;with restraints reapplied  OT Visit Diagnosis: Unsteadiness on feet (R26.81);Muscle weakness (generalized) (M62.81);Other symptoms and signs involving cognitive function;Pain Pain - Right/Left: Left Pain - part of body: Hip                Time: 8964-8941 OT Time Calculation (min): 23 min Charges:  OT General Charges $OT Visit: 1 Visit OT Evaluation $OT Eval Moderate Complexity: 1 Mod  Maurilio CROME, OTR/L.  [facility]  Office: [phone]   Maurilio [name] Geroge Gilliam 04/03/2024, 11:55 AM

## 2024-04-03 NOTE — Progress Notes (Signed)
 Pt. Restless and combative, refused to keep on oxygen and cardiac monitoring. CCMD and MD notified

## 2024-04-03 NOTE — Progress Notes (Signed)
 PROGRESS NOTE    Samuel Mata  FMW:[id number] DOB: 02/25/1931 DOA: 04/01/2024 PCP: Donata Snowman, PA-C   Brief Narrative:  This 89 yrs old Male with PMH significant for bronchiectasis, Essential hypertension, history of CVA, Nephrolithiasis, history of pulmonary nocardiosis presented after the fall at his assisted living facility.  Patient had an unwitnessed fall early morning around 5 AM.  He was found on the floor.  In the last few days patient has been leaning on the left and exhibited confusion which is atypical for him.  His daughter wonders he may be having a possible stroke.  In the ED pelvic x-ray showed subtle cortical subcapital proximal left femur fracture.  CT of the head and C-spine showed no acute intracranial abnormality or cervical fracture.  Patient did have left parietal scalp contusion.  Patient was admitted for further evaluation,  orthopedics is consulted.  Assessment & Plan:   Principal Problem:   Closed subcapital fracture of femur, left, initial encounter [facility]) Active Problems:   Fall at home, initial encounter   Acute encephalopathy   Urinary tract infection   Localized swelling of lower extremity   Thrombocytosis   History of CVA (cerebrovascular accident)   Hyperlipidemia   Bronchiectasis without complication (HCC)   Cognitive impairment   BPH (benign prostatic hyperplasia)  Left subcapital femur fracture secondary to fall: History of left intertrochanteric hip fracture: Patient presented with left hip pain after having an unwitnessed fall.  He sustained a contusion to the left parietal area.  Imaging studies revealed new nondisplaced subcapital femur neck fracture on the left with prior intertrochanteric fixation intact.  Orthopedic was consulted for possible need of surgical correction. Strict bedrest,  Continue adequate pain control. TOC consulted for possible need for placement. Orthopedics is consulted, no plan for surgical intervention.    Weightbearing as tolerated left leg.  PT and OT consultation   Acute metabolic encephalopathy: Urinary tract infection: Patient reportedly had been more confused than normal over the last couple of days. Daughter reports leaning more towards his left.    CT scan of the head did not note any acute abnormality. Continue Delirium precautions UA positive.  Could be contributing.   Continue ceftriaxone.  Follow-up urine cultures May warrant further workup with MRI of the brain for further workup if remains confused.  Lower extremity swelling: He was noted to have more swelling in his lower extremities recently. Patient had negative Doppler ultrasound of the lower extremities back in July of this year.   Lower suspicion for DVT but still a possibility given that he is mostly wheelchair-bound. BNP 165.2 Venous duplex > No evidence of DVT in both lower extremities.  Thrombocytosis: Likely reactive, now back to normal.  History of stroke: Hyperlipidemia: Continue statin Plan to resume Plavix    Bronchiectasis No significant wheezes or rhonchi appreciated at this time. Incentive spirometry Continue Breathing treatments as needed   Cognitive impairment: Delirium precautions.   BPH: Continue Flomax .   GERD: Continue Pepcid .  DVT prophylaxis: Lovenox  Code Status:DNR Family Communication: No family at bed side. Disposition Plan:    Status is: Inpatient Remains inpatient appropriate because: Status post mechanical fall with left femur fracture.  Orthopedics is consulted Recommended nonoperative management, PT and OT evaluation completed,  SNF TOC is aware    Consultants:  Orthopeadics  Procedures:  Antimicrobials: Anti-infectives (From admission, onward)    Start     Dose/Rate Route Frequency Ordered Stop   04/01/24 1400  cefTRIAXone (ROCEPHIN) 2 g in sodium chloride  0.9 %  100 mL IVPB  Status:  Discontinued        2 g 200 mL/hr over 30 Minutes Intravenous Every 24 hours  04/01/24 1355 04/01/24 1355   04/01/24 1400  cefTRIAXone (ROCEPHIN) 1 g in sodium chloride  0.9 % 100 mL IVPB        1 g 200 mL/hr over 30 Minutes Intravenous Every 24 hours 04/01/24 1355        Subjective: Patient was seen and examined at bedside.  Overnight events noted. Patient remains confused,  Patient remains in soft mittens just to avoid pulling IV lines. He is much calmer today in the morning.  Objective: Vitals:   04/02/24 1155 04/02/24 1253 04/02/24 1930 04/03/24 0342  BP:   132/65 (!) 147/71  Pulse:   78 (!) 101  Resp:   16   Temp:   98.2 F (36.8 C)   TempSrc:   Oral   SpO2: (!) 75% 96% 97% 90%  Weight:      Height:        Intake/Output Summary (Last 24 hours) at 04/03/2024 1352 Last data filed at 04/03/2024 0344 Gross per 24 hour  Intake 380 ml  Output --  Net 380 ml   Filed Weights   04/01/24 0617 04/01/24 0619  Weight: 57 kg 57 kg    Examination:  General exam: Appears calm and comfortable, not in any acute distress, deconditioned. Respiratory system: CTA bilaterally. Respiratory effort normal. RR 14 Cardiovascular system: S1 & S2 heard, RRR. No JVD, murmurs, rubs, gallops or clicks. Gastrointestinal system: Abdomen is non distended, soft and non tender. Normal bowel sounds heard. Central nervous system: Alert and oriented x 1. No focal neurological deficits. Extremities: Left hip tenderness noted,  Skin: No rashes, lesions or ulcers Psychiatry:  Mood & affect appropriate.     Data Reviewed: I have personally reviewed following labs and imaging studies  CBC: Recent Labs  Lab 04/01/24 0551 04/02/24 0418  WBC 9.9 9.1  NEUTROABS 7.0  --   HGB 13.1 12.3*  HCT 39.7 38.7*  MCV 94.7 96.8  PLT 425* 375   Basic Metabolic Panel: Recent Labs  Lab 04/01/24 0551 04/02/24 0418  NA 138 141  K 3.7 3.7  CL 103 103  CO2 24 28  GLUCOSE 88 81  BUN 22 22  CREATININE 0.96 1.03  CALCIUM  9.0 8.7*   GFR: Estimated Creatinine Clearance: 38.4 mL/min  (by C-G formula based on SCr of 1.03 mg/dL). Liver Function Tests: No results for input(s): AST, ALT, ALKPHOS, BILITOT, PROT, ALBUMIN in the last 168 hours. No results for input(s): LIPASE, AMYLASE in the last 168 hours. No results for input(s): AMMONIA in the last 168 hours. Coagulation Profile: No results for input(s): INR, PROTIME in the last 168 hours. Cardiac Enzymes: No results for input(s): CKTOTAL, CKMB, CKMBINDEX, TROPONINI in the last 168 hours. BNP (last 3 results) No results for input(s): PROBNP in the last 8760 hours. HbA1C: No results for input(s): HGBA1C in the last 72 hours. CBG: No results for input(s): GLUCAP in the last 168 hours. Lipid Profile: No results for input(s): CHOL, HDL, LDLCALC, TRIG, CHOLHDL, LDLDIRECT in the last 72 hours. Thyroid Function Tests: Recent Labs    04/01/24 1634  TSH 1.012   Anemia Panel: No results for input(s): VITAMINB12, FOLATE, FERRITIN, TIBC, IRON, RETICCTPCT in the last 72 hours. Sepsis Labs: No results for input(s): PROCALCITON, LATICACIDVEN in the last 168 hours.  No results found for this or any previous visit (from the past  240 hours).  Radiology Studies: VAS US  LOWER EXTREMITY VENOUS (DVT) Result Date: 04/03/2024  Lower Venous DVT Study Patient Name:  Samuel Mata  Date of Exam:   04/02/2024 Medical Rec #: [id number]      Accession #:    [id number] Date of Birth: Apr 23, 1931     Patient Gender: M Patient Age:   62 years Exam Location:  [facility] Procedure:      VAS US  LOWER EXTREMITY VENOUS (DVT) Referring Phys: MAXIMINO SHARPS --------------------------------------------------------------------------------  Indications: Swelling. Other Indications: Left femoral neck fracture. Limitations: Altered mental status, somnolence. Comparison Study: Prior negative left LEV done 12/11/23 Performing Technologist: Alberta Lis RVS  Examination Guidelines: A complete  evaluation includes B-mode imaging, spectral Doppler, color Doppler, and power Doppler as needed of all accessible portions of each vessel. Bilateral testing is considered an integral part of a complete examination. Limited examinations for reoccurring indications may be performed as noted. The reflux portion of the exam is performed with the patient in reverse Trendelenburg.  +---------+---------------+---------+-----------+----------+---------------+ RIGHT    CompressibilityPhasicitySpontaneityPropertiesThrombus Aging  +---------+---------------+---------+-----------+----------+---------------+ CFV      Full           Yes      Yes                                  +---------+---------------+---------+-----------+----------+---------------+ SFJ      Full                                                         +---------+---------------+---------+-----------+----------+---------------+ FV Prox  Full           Yes      Yes                                  +---------+---------------+---------+-----------+----------+---------------+ FV Mid   Full                                                         +---------+---------------+---------+-----------+----------+---------------+ FV DistalFull           Yes      Yes                                  +---------+---------------+---------+-----------+----------+---------------+ PFV      Full                                                         +---------+---------------+---------+-----------+----------+---------------+ POP      Full           Yes      Yes                                  +---------+---------------+---------+-----------+----------+---------------+ PTV  Patent by color +---------+---------------+---------+-----------+----------+---------------+ PERO     Full                                                          +---------+---------------+---------+-----------+----------+---------------+   +---------+---------------+---------+-----------+----------+--------------+ LEFT     CompressibilityPhasicitySpontaneityPropertiesThrombus Aging +---------+---------------+---------+-----------+----------+--------------+ CFV      Full           Yes      Yes                                 +---------+---------------+---------+-----------+----------+--------------+ SFJ      Full                                                        +---------+---------------+---------+-----------+----------+--------------+ FV Prox  Full           Yes      Yes                                 +---------+---------------+---------+-----------+----------+--------------+ FV Mid   Full                                                        +---------+---------------+---------+-----------+----------+--------------+ FV DistalFull                                                        +---------+---------------+---------+-----------+----------+--------------+ PFV      Full                                                        +---------+---------------+---------+-----------+----------+--------------+ POP      Full           Yes      Yes                                 +---------+---------------+---------+-----------+----------+--------------+ PTV      Full                                                        +---------+---------------+---------+-----------+----------+--------------+ PERO     Full                                                        +---------+---------------+---------+-----------+----------+--------------+  Summary: BILATERAL: - No evidence of deep vein thrombosis seen in the lower extremities, bilaterally. -No evidence of popliteal cyst, bilaterally.   *See table(s) above for measurements and observations. Electronically signed by Debby Robertson on 04/03/2024 at 10:00:24  AM.    Final    Scheduled Meds:  docusate sodium   100 mg Oral BID   enoxaparin  (LOVENOX ) injection  40 mg Subcutaneous Q24H   famotidine   20 mg Oral Daily   rosuvastatin   20 mg Oral Daily   tamsulosin   0.4 mg Oral QHS   Continuous Infusions:  sodium chloride  40 mL/hr at 04/02/24 2041   cefTRIAXone (ROCEPHIN)  IV 1 g (04/02/24 1636)     LOS: 2 days    Time spent: 35 mins    Darcel Dawley, MD [facility]   If 7PM-7AM, please contact night-coverage

## 2024-04-03 NOTE — Evaluation (Signed)
 Physical Therapy Evaluation Patient Details Name: Samuel Mata MRN: [id number] DOB: 08/09/1930 Today's Date: 04/03/2024  History of Present Illness  Pt is 89 yo presenting to [facility] on 11/16 due to a fall and metabolic encephalopathy/confusion. Pt is 4 months status post IM nailing of L Hip. CT with findings of cortical irregularity but no distinct fracture lines. PMH: bronchiectasis, HTN, CVA, nephrolithiasis, nocardiosis pulmonary.  Clinical Impression  Currently pt is Mod A for bed mobility, Min A +2 for sit to stand and side steps at EOB with RW. Pt is limited by cognition and weakness/balance deficits. Pt is from ALF and depending on level of care can potentially return to ALF. Currently pt will require physical assist for transfers/bed mobility. Due to pt current functional status, home set up and available assistance at home recommending skilled physical therapy services < 3 hours/day in order to address strength, balance and functional mobility to decrease risk for falls, injury, immobility, skin break down and re-hospitalization.          If plan is discharge home, recommend the following: Assistance with cooking/housework;A little help with walking and/or transfers;Assist for transportation;Supervision due to cognitive status     Equipment Recommendations None recommended by PT     Functional Status Assessment Patient has had a recent decline in their functional status and demonstrates the ability to make significant improvements in function in a reasonable and predictable amount of time.     Precautions / Restrictions Precautions Precautions: Fall Recall of Precautions/Restrictions: Impaired Restrictions Weight Bearing Restrictions Per Provider Order: No Other Position/Activity Restrictions: WBAT      Mobility  Bed Mobility Overal bed mobility: Needs Assistance Bed Mobility: Supine to Sit, Sit to Supine     Supine to sit: Mod assist Sit to supine: Mod assist, +2 for  safety/equipment   General bed mobility comments: Mod A for trunk to midline and Mod A for LE back to bed. +2 present for safety due to pt cognition    Transfers Overall transfer level: Needs assistance Equipment used: Rolling walker (2 wheels) Transfers: Sit to/from Stand Sit to Stand: Min assist, +2 physical assistance, +2 safety/equipment           General transfer comment: Verbal cues for sequencing with count, Min A for initial momentum to get to standing and MIn A for stabilization. MIn Assist for wgt shifting in order to take steps at side of bed to initiate movement due to poor initiation of movement. Max A managing AD for side stepping    Ambulation/Gait     Pre-gait activities: side steps at EOB with Min A +2 and Max A to navigate RW      Balance Overall balance assessment: Needs assistance Sitting-balance support: Bilateral upper extremity supported, Feet supported Sitting balance-Leahy Scale: Fair Sitting balance - Comments: CGA; initially Min to MOd A for trunk in mid line   Standing balance support: Reliant on assistive device for balance, Bilateral upper extremity supported Standing balance-Leahy Scale: Poor Standing balance comment: 2 person Min A for balance with bil UE support on RW       Pertinent Vitals/Pain Pain Assessment Pain Assessment: Faces Faces Pain Scale: Hurts a little bit Breathing: normal Negative Vocalization: occasional moan/groan, low speech, negative/disapproving quality Facial Expression: facial grimacing Body Language: relaxed Consolability: no need to console PAINAD Score: 3 Pain Location: L hip with mobility Pain Descriptors / Indicators: Discomfort, Grimacing Pain Intervention(s): Monitored during session, Limited activity within patient's tolerance    Home Living Family/patient expects  to be discharged to:: Assisted living     Prior Function Prior Level of Function : Needs assist   Mobility Comments: per pt chart he was  transferring with RW to W/C and using W/C for mobility ADLs Comments: assist at ALF     Extremity/Trunk Assessment   Upper Extremity Assessment Upper Extremity Assessment: Defer to OT evaluation    Lower Extremity Assessment Lower Extremity Assessment: Generalized weakness;LLE deficits/detail LLE Deficits / Details: IMN 4 months ago    Cervical / Trunk Assessment Cervical / Trunk Assessment: Kyphotic  Communication   Communication Communication: Impaired Factors Affecting Communication: Hearing impaired    Cognition Arousal: Alert Behavior During Therapy: WFL for tasks assessed/performed   PT - Cognitive impairments: No family/caregiver present to determine baseline, Orientation, Awareness, Memory, Safety/Judgement, Problem solving, Sequencing, Attention   Orientation impairments: Place, Time, Situation     Following commands: Impaired Following commands impaired: Only follows one step commands consistently, Follows one step commands with increased time     Cueing Cueing Techniques: Verbal cues, Tactile cues, Visual cues     General Comments General comments (skin integrity, edema, etc.): no signs/symptoms of cardiac/respiratory distress during session.        Assessment/Plan    PT Assessment Patient needs continued PT services  PT Problem List Decreased strength;Decreased activity tolerance;Decreased balance;Decreased mobility;Decreased safety awareness;Decreased cognition;Pain       PT Treatment Interventions DME instruction;Balance training;Gait training;Functional mobility training;Therapeutic activities;Patient/family education;Therapeutic exercise;Wheelchair mobility training    PT Goals (Current goals can be found in the Care Plan section)  Acute Rehab PT Goals PT Goal Formulation: Patient unable to participate in goal setting Time For Goal Achievement: 04/17/24 Potential to Achieve Goals: Fair    Frequency Min 1X/week     Co-evaluation PT/OT/SLP  Co-Evaluation/Treatment: Yes Reason for Co-Treatment: Necessary to address cognition/behavior during functional activity PT goals addressed during session: Mobility/safety with mobility;Balance;Proper use of DME         AM-PAC PT 6 Clicks Mobility  Outcome Measure Help needed turning from your back to your side while in a flat bed without using bedrails?: A Lot Help needed moving from lying on your back to sitting on the side of a flat bed without using bedrails?: A Lot Help needed moving to and from a bed to a chair (including a wheelchair)?: Total Help needed standing up from a chair using your arms (e.g., wheelchair or bedside chair)?: Total Help needed to walk in hospital room?: Total Help needed climbing 3-5 steps with a railing? : Total 6 Click Score: 8    End of Session Equipment Utilized During Treatment: Gait belt Activity Tolerance: Patient tolerated treatment well Patient left: in bed;with call bell/phone within reach;with bed alarm set;with restraints reapplied Nurse Communication: Mobility status PT Visit Diagnosis: Unsteadiness on feet (R26.81);Other abnormalities of gait and mobility (R26.89);Muscle weakness (generalized) (M62.81);History of falling (Z91.81);Pain Pain - Right/Left: Left Pain - part of body: Hip    Time: 8964-8941 PT Time Calculation (min) (ACUTE ONLY): 23 min   Charges:   PT Evaluation $PT Eval Low Complexity: 1 Low   PT General Charges $$ ACUTE PT VISIT: 1 Visit        Dorothyann Maier, DPT, CLT  Acute Rehabilitation Services Office: [phone] (Secure chat preferred)   Dorothyann VEAR Maier 04/03/2024, 11:20 AM

## 2024-04-03 NOTE — Progress Notes (Signed)
 Daughter arrived to help with redirecting pt. due to confusing and combative.

## 2024-04-03 NOTE — Progress Notes (Signed)
 Pt.alert oriented to self only, self fed 50% of meal and brushed teeth. Continues to attempt to get out of bed, bed in lowest position, alarm on and floor mats down.

## 2024-04-03 NOTE — TOC Initial Note (Signed)
 Transition of Care [facility]) - Initial/Assessment Note    Patient Details  Name: Samuel Mata MRN: [id number] Date of Birth: December 08, 1930  Transition of Care [facility]) CM/SW Contact:    Bridget Cordella Simmonds, LCSW Phone Number: 04/03/2024, 3:01 PM  Clinical Narrative:      Pt oriented x0, in restraints.  CSW spoke with pt daughter  [name] by phone.  She was at the airport on her way to [place].  Pt from [place], Assisted living level.  Normally pt is very sharp mentally but has had problems with confusion in the hospital in the past as well.  Has been to [facility] twice.  Discussed current PT recs for SNF.  Leslie's first choice would be return to [facility] with PT.  Pt was already receiving PT services prior to this admission.  CSW spoke with [place] at [facility], cell: [phone].  She said they would accept pt back without pt going to SNF and pt can continue PT services there.  Would need pt to be out of restraints x24 hours.  Also would need HH orders.           Expected Discharge Plan: Assisted Living [facility]) Barriers to Discharge: Continued Medical Work up, Other (must enter comment) (currently in restraints)   Patient Goals and CMS Choice     Choice offered to / list presented to : Adult Children (daughter [name])      Expected Discharge Plan and Services In-house Referral: Clinical Social Work   Post Acute Care Choice: Resumption of Svcs/PTA Provider [facility] with PT) Living arrangements for the past 2 months: Assisted Living Facility                                      Prior Living Arrangements/Services Living arrangements for the past 2 months: Assisted Living Facility Lives with:: Facility Resident Patient language and need for interpreter reviewed:: No        Need for Family Participation in Patient Care: Yes (Comment) Care giver support system in place?: Yes (comment) Current home services: Home PT Criminal Activity/Legal Involvement Pertinent to Current  Situation/Hospitalization: No - Comment as needed  Activities of Daily Living   ADL Screening (condition at time of admission) Independently performs ADLs?: No Does the patient have a NEW difficulty with bathing/dressing/toileting/self-feeding that is expected to last >3 days?: Yes (Initiates electronic notice to provider for possible OT consult) Does the patient have a NEW difficulty with getting in/out of bed, walking, or climbing stairs that is expected to last >3 days?: Yes (Initiates electronic notice to provider for possible PT consult) Does the patient have a NEW difficulty with communication that is expected to last >3 days?: No Is the patient deaf or have difficulty hearing?: Yes Does the patient have difficulty seeing, even when wearing glasses/contacts?: No Does the patient have difficulty concentrating, remembering, or making decisions?: No  Permission Sought/Granted                  Emotional Assessment Appearance:: Appears stated age Attitude/Demeanor/Rapport: Unable to Assess Affect (typically observed): Unable to Assess Orientation: :  (not oriented)      Admission diagnosis:  Fall, initial encounter [W19.XXXA] Closed subcapital fracture of femur, left, initial encounter (HCC) [S72.012A] Closed fracture of left femur, unspecified fracture morphology, unspecified portion of femur, initial encounter [facility]) [S72.92XA] Patient Active Problem List   Diagnosis Date Noted   Closed subcapital fracture of  femur, left, initial encounter (HCC) 04/01/2024   Acute encephalopathy 04/01/2024   Localized swelling of lower extremity 04/01/2024   Thrombocytosis 04/01/2024   Hyperlipidemia 04/01/2024   Cognitive impairment 04/01/2024   BPH (benign prostatic hyperplasia) 04/01/2024   Urinary tract infection 04/01/2024   Coping style affecting medical condition 11/11/2023   Intertrochanteric fracture of left hip (HCC) 11/05/2023   Malnutrition of moderate degree 11/01/2023    Fall at home, initial encounter 10/31/2023   Hyponatremia 04/25/2023   Right thalamic stroke (HCC) 04/12/2023   Acute CVA (cerebrovascular accident) (HCC) 04/08/2023   Acute ischemic stroke (HCC)    Carotid dissection, bilateral    Aphasia    Immunization due 05/20/2020   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   Medication monitoring encounter 03/07/2020   Nocardial pneumonia 02/20/2020   Bronchiectasis without complication (HCC) 11/22/2019   Hereditary spherocytosis 08/29/2019   Seborrheic keratoses 08/29/2019   OSA (obstructive sleep apnea) 03/19/2019   History of CVA (cerebrovascular accident) 01/23/2018   Hemoptysis 07/15/2017   Pulmonary infiltrates 07/15/2017   History of tobacco use 06/06/2017   Essential hypertension 09/22/2015   Gastroesophageal reflux disease without esophagitis 06/05/2014   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 AT CORNER OF [facility] [address] [place] [address] Phone: [phone] Fax: [phone]  [facility] [address] [place] [address] Phone: [phone] Fax: [phone]  [id number] [place], [place] - [address] AT [facility] [address] [place] [address] Phone: [phone] Fax: [phone]     Social Drivers of Health (SDOH) Social History: SDOH Screenings   Food Insecurity: No Food Insecurity (04/01/2024)  Housing: Low Risk  (04/01/2024)  Transportation Needs: No Transportation Needs (04/01/2024)  Utilities: Not At Risk (04/01/2024)  Depression (PHQ2-9): Low Risk  (07/01/2023)  Financial Resource Strain: Low Risk  (03/20/2024)   Received from [facility]  Physical Activity: Insufficiently Active  (03/20/2024)   Received from [facility]  Social Connections: Socially Integrated (03/20/2024)   Received from [facility]  Stress: Stress Concern Present (03/20/2024)   Received from [facility]  Tobacco Use: Medium Risk (03/22/2024)   Received from [facility]   SDOH Interventions:     Readmission Risk Interventions     No data to display

## 2024-04-03 NOTE — Progress Notes (Signed)
 This nurse not able to replace cardiac monitoring or obtain vital signs due to combative

## 2024-04-04 ENCOUNTER — Inpatient Hospital Stay (HOSPITAL_COMMUNITY)

## 2024-04-04 DIAGNOSIS — S72012A Unspecified intracapsular fracture of left femur, initial encounter for closed fracture: Secondary | ICD-10-CM | POA: Diagnosis not present

## 2024-04-04 NOTE — TOC Progression Note (Signed)
 Transition of Care [facility]) - Progression Note    Patient Details  Name: Samuel Mata MRN: [id number] Date of Birth: 11-16-1930  Transition of Care [facility]) CM/SW Contact  Bridget Cordella Simmonds, LCSW Phone Number: 04/04/2024, 4:15 PM  Clinical Narrative:   JANAS, HH orders emailed to [facility].    Expected Discharge Plan: Assisted Living [facility]) Barriers to Discharge: Continued Medical Work up, Other (must enter comment) (currently in restraints)               Expected Discharge Plan and Services In-house Referral: Clinical Social Work   Post Acute Care Choice: Resumption of Svcs/PTA Provider [facility] with PT) Living arrangements for the past 2 months: Assisted Living Facility                                       Social Drivers of Health (SDOH) Interventions SDOH Screenings   Food Insecurity: No Food Insecurity (04/01/2024)  Housing: Low Risk  (04/01/2024)  Transportation Needs: No Transportation Needs (04/01/2024)  Utilities: Not At Risk (04/01/2024)  Depression (PHQ2-9): Low Risk  (07/01/2023)  Financial Resource Strain: Low Risk  (03/20/2024)   Received from [facility]  Physical Activity: Insufficiently Active (03/20/2024)   Received from [facility]  Social Connections: Socially Integrated (03/20/2024)   Received from [facility]  Stress: Stress Concern Present (03/20/2024)   Received from [facility]  Tobacco Use: Medium Risk (04/03/2024)    Readmission Risk Interventions     No data to display

## 2024-04-04 NOTE — Plan of Care (Signed)
  Problem: Education: Goal: Knowledge of General Education information will improve Description: Including pain rating scale, medication(s)/side effects and non-pharmacologic comfort measures Outcome: Progressing   Problem: Health Behavior/Discharge Planning: Goal: Ability to manage health-related needs will improve Outcome: Progressing   Problem: Clinical Measurements: Goal: Ability to maintain clinical measurements within normal limits will improve Outcome: Progressing Goal: Will remain free from infection Outcome: Progressing Goal: Diagnostic test results will improve Outcome: Progressing Goal: Respiratory complications will improve Outcome: Progressing Goal: Cardiovascular complication will be avoided Outcome: Progressing   Problem: Activity: Goal: Risk for activity intolerance will decrease Outcome: Progressing   Problem: Coping: Goal: Level of anxiety will decrease Outcome: Progressing   Problem: Nutrition: Goal: Adequate nutrition will be maintained Outcome: Progressing   Problem: Elimination: Goal: Will not experience complications related to bowel motility Outcome: Progressing Goal: Will not experience complications related to urinary retention Outcome: Progressing   Problem: Safety: Goal: Ability to remain free from injury will improve Outcome: Progressing   Problem: Skin Integrity: Goal: Risk for impaired skin integrity will decrease Outcome: Progressing   Problem: Pain Managment: Goal: General experience of comfort will improve and/or be controlled Outcome: Progressing   Problem: Safety: Goal: Non-violent Restraint(s) Outcome: Progressing

## 2024-04-04 NOTE — Care Management Important Message (Signed)
 Important Message  Patient Details  Name: Samuel Mata MRN: [id number] Date of Birth: [birth year]   Important Message Given:  Yes - Medicare IM     [name] 04/04/2024, 11:56 AM

## 2024-04-04 NOTE — NC FL2 (Signed)
 [place]  MEDICAID FL2 LEVEL OF CARE FORM     IDENTIFICATION  Patient Name: Samuel Mata Birthdate: 07-22-1930 Sex: male Admission Date (Current Location): 04/01/2024  [facility] and [place] Number:  Producer, Television/film/video and Address:  [facility] [place]. [facility], [address] [place], [place] [address]      Provider Number: [id number]  Attending Physician Name and Address:  Leotis Bogus, MD  Relative Name and Phone Number:  Arsenio, Schnorr Daughter   [phone]    Current Level of Care: Hospital Recommended Level of Care: Assisted Living Facility ([facility]) Prior Approval Number:    Date Approved/Denied:   PASRR Number:    Discharge Plan: Other (Comment) ([facility])    Current Diagnoses: Patient Active Problem List   Diagnosis Date Noted   Closed subcapital fracture of femur, left, initial encounter (HCC) 04/01/2024   Acute encephalopathy 04/01/2024   Localized swelling of lower extremity 04/01/2024   Thrombocytosis 04/01/2024   Hyperlipidemia 04/01/2024   Cognitive impairment 04/01/2024   BPH (benign prostatic hyperplasia) 04/01/2024   Urinary tract infection 04/01/2024   Coping style affecting medical condition 11/11/2023   Intertrochanteric fracture of left hip (HCC) 11/05/2023   Malnutrition of moderate degree 11/01/2023   Fall at home, initial encounter 10/31/2023   Hyponatremia 04/25/2023   Right thalamic stroke (HCC) 04/12/2023   Acute CVA (cerebrovascular accident) (HCC) 04/08/2023   Acute ischemic stroke (HCC)    Carotid dissection, bilateral    Aphasia    Immunization due 05/20/2020   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   Medication monitoring encounter 03/07/2020   Nocardial pneumonia 02/20/2020   Bronchiectasis without complication (HCC) 11/22/2019   Hereditary spherocytosis 08/29/2019   Seborrheic keratoses 08/29/2019   OSA (obstructive sleep apnea) 03/19/2019   History of CVA (cerebrovascular accident)  01/23/2018   Hemoptysis 07/15/2017   Pulmonary infiltrates 07/15/2017   History of tobacco use 06/06/2017   Essential hypertension 09/22/2015   Gastroesophageal reflux disease without esophagitis 06/05/2014   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   Chronic obstructive pulmonary disease (HCC) 08/14/2013   Chronic rhinitis 08/14/2013   Hearing loss of both ears 08/14/2013    Orientation RESPIRATION BLADDER Height & Weight     Self  O2 Incontinent Weight: 125 lb 10.6 oz (57 kg) Height:  5' 7 (170.2 cm)  BEHAVIORAL SYMPTOMS/MOOD NEUROLOGICAL BOWEL NUTRITION STATUS      Continent Diet (soft diet)  AMBULATORY STATUS COMMUNICATION OF NEEDS Skin   Extensive Assist Verbally Other (Comment) (ecchymosis)                       Personal Care Assistance Level of Assistance  Bathing, Feeding, Dressing Bathing Assistance: Maximum assistance Feeding assistance: Limited assistance Dressing Assistance: Maximum assistance     Functional Limitations Info             SPECIAL CARE FACTORS FREQUENCY  PT (By licensed PT), OT (By licensed OT)     PT Frequency: evaluate and treat OT Frequency: evaluate and treat            Contractures Contractures Info: Not present    Additional Factors Info  Code Status, Allergies Code Status Info: DNR Allergies Info: codeine           Current Medications (04/04/2024):  This is the current hospital active medication list Current Facility-Administered Medications  Medication Dose Route Frequency Provider Last Rate Last Admin   cefTRIAXone (ROCEPHIN) 1 g in sodium chloride  0.9 % 100 mL IVPB  1  g Intravenous Q24H Smith, Rondell A, MD 200 mL/hr at 04/03/24 1622 1 g at 04/03/24 1622   docusate sodium  (COLACE) capsule 100 mg  100 mg Oral BID Smith, Rondell A, MD   100 mg at 04/04/24 1013   enoxaparin  (LOVENOX ) injection 40 mg  40 mg Subcutaneous Q24H Smith, Rondell A, MD   40 mg at 04/04/24 1012   famotidine  (PEPCID ) tablet 20 mg  20 mg Oral Daily Smith,  Rondell A, MD   20 mg at 04/04/24 1013   fentaNYL  (SUBLIMAZE ) injection 25 mcg  25 mcg Intravenous Q2H PRN Smith, Rondell A, MD   25 mcg at 04/02/24 0245   HYDROcodone -acetaminophen  (NORCO/VICODIN) 5-325 MG per tablet 1-2 tablet  1-2 tablet Oral Q6H PRN Smith, Rondell A, MD   2 tablet at 04/03/24 2044   OLANZapine (ZYPREXA) injection 2.5 mg  2.5 mg Intramuscular Once PRN Daniels, James K, NP       rosuvastatin  (CRESTOR ) tablet 20 mg  20 mg Oral Daily Smith, Rondell A, MD   20 mg at 04/04/24 1013   tamsulosin  (FLOMAX ) capsule 0.4 mg  0.4 mg Oral QHS Smith, Rondell A, MD   0.4 mg at 04/03/24 2044     Discharge Medications: Please see discharge summary for a list of discharge medications.  Relevant Imaging Results:  Relevant Lab Results:   Additional Information ssn: [id number]  Bridget Cordella Simmonds, LCSW

## 2024-04-04 NOTE — Progress Notes (Signed)
 PROGRESS NOTE    Samuel Mata  FMW:[id number] DOB: 17-Jun-1930 DOA: 04/01/2024 PCP: Donata Snowman, PA-C   Brief Narrative:  This 89 yrs old Male with PMH significant for bronchiectasis, Essential hypertension, history of CVA, Nephrolithiasis, history of pulmonary nocardiosis presented after the fall at his assisted living facility.  Patient had an unwitnessed fall early morning around 5 AM.  He was found on the floor.  In the last few days patient has been leaning on the left and exhibited confusion which is atypical for him.  His daughter wonders he may be having a possible stroke.  In the ED pelvic x-ray showed subtle cortical subcapital proximal left femur fracture.  CT of the head and C-spine showed no acute intracranial abnormality or cervical fracture.  Patient did have left parietal scalp contusion.  Patient was admitted for further evaluation,  orthopedics is consulted.  Assessment & Plan:   Principal Problem:   Closed subcapital fracture of femur, left, initial encounter [facility]) Active Problems:   Fall at home, initial encounter   Acute encephalopathy   Urinary tract infection   Localized swelling of lower extremity   Thrombocytosis   History of CVA (cerebrovascular accident)   Hyperlipidemia   Bronchiectasis without complication (HCC)   Cognitive impairment   BPH (benign prostatic hyperplasia)  Left subcapital femur fracture secondary to fall: History of left intertrochanteric hip fracture: Patient presented with left hip pain after having an unwitnessed fall.  He sustained a contusion to the left parietal area.  Imaging studies revealed new nondisplaced subcapital femur neck fracture on the left with prior intertrochanteric fixation intact.  Orthopedic was consulted for possible need of surgical correction. Strict bedrest,  Continue adequate pain control. TOC consulted for possible need for placement. Orthopedics is consulted, no plan for surgical intervention.    Weightbearing as tolerated left leg.   PT and OT recommended SNF.   Acute metabolic encephalopathy: Urinary tract infection: Patient reportedly had been more confused than normal over the last couple of days. Daughter reports leaning more towards his left.    CT scan of the head did not note any acute abnormality. Continue Delirium precautions UA positive.  Could be contributing.   Continue ceftriaxone .  Follow-up urine cultures. May warrant further workup with MRI of the brain for further workup if remains confused.  Lower extremity swelling: He was noted to have more swelling in his lower extremities recently. Patient had negative Doppler ultrasound of the lower extremities back in July of this year.   Lower suspicion for DVT but still a possibility given that he is mostly wheelchair-bound. BNP 165.2 Venous duplex > No evidence of DVT in both lower extremities.  Thrombocytosis: Likely reactive, now back to normal.  History of stroke: Hyperlipidemia: Continue statin Plan to resume Plavix    Bronchiectasis No significant wheezes or rhonchi appreciated at this time. Incentive spirometry Continue Breathing treatments as needed   Cognitive impairment: Delirium precautions.   BPH: Continue Flomax .   GERD: Continue Pepcid .  DVT prophylaxis: Lovenox  Code Status:DNR Family Communication: No family at bed side. Disposition Plan:    Status is: Inpatient Remains inpatient appropriate because: Status post mechanical fall with left femur fracture.  Orthopedics is consulted Recommended nonoperative management, PT and OT evaluation completed,  SNF TOC is aware    Consultants:  Orthopeadics  Procedures:  Antimicrobials: Anti-infectives (From admission, onward)    Start     Dose/Rate Route Frequency Ordered Stop   04/01/24 1400  cefTRIAXone  (ROCEPHIN ) 2 g in sodium chloride   0.9 % 100 mL IVPB  Status:  Discontinued        2 g 200 mL/hr over 30 Minutes Intravenous Every  24 hours 04/01/24 1355 04/01/24 1355   04/01/24 1400  cefTRIAXone (ROCEPHIN) 1 g in sodium chloride  0.9 % 100 mL IVPB        1 g 200 mL/hr over 30 Minutes Intravenous Every 24 hours 04/01/24 1355        Subjective: Patient was seen and examined at bedside. Overnight events noted. Patient seems improved, He is  following commands.  Daughter at bedside. He is much calmer today in the morning.  Objective: Vitals:   04/03/24 1455 04/03/24 1938 04/04/24 0509 04/04/24 0744  BP: 139/60 (!) 164/81 (!) 154/76 (!) 155/84  Pulse: 70 74 69 83  Resp: 17 17 18 18   Temp:    98.3 F (36.8 C)  TempSrc: Axillary   Oral  SpO2: 94% 96%  96%  Weight:      Height:        Intake/Output Summary (Last 24 hours) at 04/04/2024 1411 Last data filed at 04/04/2024 0823 Gross per 24 hour  Intake 180 ml  Output 100 ml  Net 80 ml   Filed Weights   04/01/24 0617 04/01/24 0619  Weight: 57 kg 57 kg    Examination:  General exam: Appears calm and comfortable, not in any acute distress, deconditioned. Respiratory system: CTA bilaterally. Respiratory effort normal. RR 15 Cardiovascular system: S1 & S2 heard, RRR. No JVD, murmurs, rubs, gallops or clicks. Gastrointestinal system: Abdomen is non distended, soft and non tender. Normal bowel sounds heard. Central nervous system: Alert and oriented x 2. No focal neurological deficits. Extremities: Left hip tenderness noted,  Skin: No rashes, lesions or ulcers Psychiatry:  Mood & affect appropriate.     Data Reviewed: I have personally reviewed following labs and imaging studies  CBC: Recent Labs  Lab 04/01/24 0551 04/02/24 0418  WBC 9.9 9.1  NEUTROABS 7.0  --   HGB 13.1 12.3*  HCT 39.7 38.7*  MCV 94.7 96.8  PLT 425* 375   Basic Metabolic Panel: Recent Labs  Lab 04/01/24 0551 04/02/24 0418  NA 138 141  K 3.7 3.7  CL 103 103  CO2 24 28  GLUCOSE 88 81  BUN 22 22  CREATININE 0.96 1.03  CALCIUM  9.0 8.7*   GFR: Estimated Creatinine  Clearance: 38.4 mL/min (by C-G formula based on SCr of 1.03 mg/dL). Liver Function Tests: No results for input(s): AST, ALT, ALKPHOS, BILITOT, PROT, ALBUMIN in the last 168 hours. No results for input(s): LIPASE, AMYLASE in the last 168 hours. No results for input(s): AMMONIA in the last 168 hours. Coagulation Profile: No results for input(s): INR, PROTIME in the last 168 hours. Cardiac Enzymes: No results for input(s): CKTOTAL, CKMB, CKMBINDEX, TROPONINI in the last 168 hours. BNP (last 3 results) No results for input(s): PROBNP in the last 8760 hours. HbA1C: No results for input(s): HGBA1C in the last 72 hours. CBG: No results for input(s): GLUCAP in the last 168 hours. Lipid Profile: No results for input(s): CHOL, HDL, LDLCALC, TRIG, CHOLHDL, LDLDIRECT in the last 72 hours. Thyroid Function Tests: Recent Labs    04/01/24 1634  TSH 1.012   Anemia Panel: No results for input(s): VITAMINB12, FOLATE, FERRITIN, TIBC, IRON, RETICCTPCT in the last 72 hours. Sepsis Labs: No results for input(s): PROCALCITON, LATICACIDVEN in the last 168 hours.  Recent Results (from the past 240 hours)  Urine Culture  Status: None (Preliminary result)   Collection Time: 04/02/24  8:42 PM   Specimen: Urine, Random  Result Value Ref Range Status   Specimen Description URINE, RANDOM  Final   Special Requests NONE Reflexed from [id number]  Final   Culture   Final    CULTURE REINCUBATED FOR BETTER GROWTH Performed at [facility], [address] [place], [place] [address]    Report Status PENDING  Incomplete    Radiology Studies: VAS US  LOWER EXTREMITY VENOUS (DVT) Result Date: 04/03/2024  Lower Venous DVT Study Patient Name:  Samuel Mata  Date of Exam:   04/02/2024 Medical Rec #: [id number]      Accession #:    [id number] Date of Birth: Oct 08, 1930     Patient Gender: M Patient Age:   39 years Exam Location:  [facility]  Procedure:      VAS US  LOWER EXTREMITY VENOUS (DVT) Referring Phys: MAXIMINO SHARPS --------------------------------------------------------------------------------  Indications: Swelling. Other Indications: Left femoral neck fracture. Limitations: Altered mental status, somnolence. Comparison Study: Prior negative left LEV done 12/11/23 Performing Technologist: Alberta Lis RVS  Examination Guidelines: A complete evaluation includes B-mode imaging, spectral Doppler, color Doppler, and power Doppler as needed of all accessible portions of each vessel. Bilateral testing is considered an integral part of a complete examination. Limited examinations for reoccurring indications may be performed as noted. The reflux portion of the exam is performed with the patient in reverse Trendelenburg.  +---------+---------------+---------+-----------+----------+---------------+ RIGHT    CompressibilityPhasicitySpontaneityPropertiesThrombus Aging  +---------+---------------+---------+-----------+----------+---------------+ CFV      Full           Yes      Yes                                  +---------+---------------+---------+-----------+----------+---------------+ SFJ      Full                                                         +---------+---------------+---------+-----------+----------+---------------+ FV Prox  Full           Yes      Yes                                  +---------+---------------+---------+-----------+----------+---------------+ FV Mid   Full                                                         +---------+---------------+---------+-----------+----------+---------------+ FV DistalFull           Yes      Yes                                  +---------+---------------+---------+-----------+----------+---------------+ PFV      Full                                                          +---------+---------------+---------+-----------+----------+---------------+  POP      Full           Yes      Yes                                  +---------+---------------+---------+-----------+----------+---------------+ PTV                                                   Patent by color +---------+---------------+---------+-----------+----------+---------------+ PERO     Full                                                         +---------+---------------+---------+-----------+----------+---------------+   +---------+---------------+---------+-----------+----------+--------------+ LEFT     CompressibilityPhasicitySpontaneityPropertiesThrombus Aging +---------+---------------+---------+-----------+----------+--------------+ CFV      Full           Yes      Yes                                 +---------+---------------+---------+-----------+----------+--------------+ SFJ      Full                                                        +---------+---------------+---------+-----------+----------+--------------+ FV Prox  Full           Yes      Yes                                 +---------+---------------+---------+-----------+----------+--------------+ FV Mid   Full                                                        +---------+---------------+---------+-----------+----------+--------------+ FV DistalFull                                                        +---------+---------------+---------+-----------+----------+--------------+ PFV      Full                                                        +---------+---------------+---------+-----------+----------+--------------+ POP      Full           Yes      Yes                                 +---------+---------------+---------+-----------+----------+--------------+  PTV      Full                                                         +---------+---------------+---------+-----------+----------+--------------+ PERO     Full                                                        +---------+---------------+---------+-----------+----------+--------------+     Summary: BILATERAL: - No evidence of deep vein thrombosis seen in the lower extremities, bilaterally. -No evidence of popliteal cyst, bilaterally.   *See table(s) above for measurements and observations. Electronically signed by Debby Robertson on 04/03/2024 at 10:00:24 AM.    Final    Scheduled Meds:  docusate sodium   100 mg Oral BID   enoxaparin  (LOVENOX ) injection  40 mg Subcutaneous Q24H   famotidine   20 mg Oral Daily   rosuvastatin   20 mg Oral Daily   tamsulosin   0.4 mg Oral QHS   Continuous Infusions:  cefTRIAXone  (ROCEPHIN )  IV 1 g (04/03/24 1622)     LOS: 3 days    Time spent: 35 mins    Darcel Dawley, MD [facility]   If 7PM-7AM, please contact night-coverage

## 2024-04-05 DIAGNOSIS — S72012A Unspecified intracapsular fracture of left femur, initial encounter for closed fracture: Secondary | ICD-10-CM | POA: Diagnosis not present

## 2024-04-05 LAB — URINE CULTURE: Culture: 30000 — AB

## 2024-04-05 MED ORDER — HYDROCODONE-ACETAMINOPHEN 5-325 MG PO TABS: 1.0000 | ORAL_TABLET | Freq: Four times a day (QID) | ORAL | 0 refills | Status: AC | PRN

## 2024-04-05 NOTE — Progress Notes (Signed)
   04/05/24 1000  Spiritual Encounters  Type of Visit Initial  Care provided to: Family  Referral source Nurse (RN/NT/LPN)  Reason for visit Routine spiritual support    Chaplain responded to consult request for Major Life Transition. Chaplain had a conversation with the patient's daughter. She mentioned some of the health issues that her dad has been through. She kindly expressed that it would be preferable for the patient to be visited by a male Protestant Chaplain. Chaplain informed Chaplain Jerona and Scot. They will be there at 11:30 am to meet with the patient.    M.Kubra Susanna Kerry Resident [phone]

## 2024-04-05 NOTE — Discharge Summary (Signed)
 Physician Discharge Summary  Samuel Mata FMW:[id number] DOB: Jul 27, 1930 DOA: 04/01/2024  PCP: Shepperson, Kirstin, PA-C  Admit date: 04/01/2024  Discharge date: 04/05/2024  Admitted From: Home  Disposition:[facility])  Recommendations for Outpatient Follow-up:  Follow up with PCP in 1-2 weeks. Please obtain BMP/CBC in one week. Advised to follow-up with orthopedics as scheduled as needed. Advised nonoperative management for fracture.  Home Health: Home PT/OT/RN Equipment/Devices:Home oxygen @ 3L/min  Discharge Condition: Stable CODE STATUS:DNR Diet recommendation: Heart Healthy   Brief Summary / Hospital Course: This 89 yrs old Male with PMH significant for bronchiectasis, Essential hypertension, history of CVA, Nephrolithiasis, history of pulmonary nocardiosis presented after the fall at his assisted living facility. Patient had an unwitnessed fall early morning around 5 AM. He was found on the floor. In the last few days patient has been leaning on the left and exhibited confusion which is atypical for him. His daughter wonders he may be having a possible stroke. In the ED pelvic x-ray showed subtle cortical subcapital proximal left femur fracture. CT of the head and C-spine showed no acute intracranial abnormality or cervical fracture. Patient did have left parietal scalp contusion. Patient was admitted for further evaluation, orthopedics is consulted.  Recommended nonoperative management.  Recommended PT and OT evaluation ,  Weightbearing as tolerated on left lower extremity.  PT and OT recommended skilled nursing facility. Patient was also treated for UTI during this hospitalization course and has completed course of antibiotics.  Patient feels much better and want to be discharged.   Discharge Diagnoses:  Principal Problem:   Closed subcapital fracture of femur, left, initial encounter [facility]) Active Problems:   Fall at home, initial encounter   Acute encephalopathy    Urinary tract infection   Localized swelling of lower extremity   Thrombocytosis   History of CVA (cerebrovascular accident)   Hyperlipidemia   Bronchiectasis without complication (HCC)   Cognitive impairment   BPH (benign prostatic hyperplasia)  Left subcapital femur fracture secondary to fall: History of left intertrochanteric hip fracture: Patient presented with left hip pain after having an unwitnessed fall.  He sustained a contusion to the left parietal area.  Imaging studies revealed new nondisplaced subcapital femur neck fracture on the left with prior intertrochanteric fixation intact.  Orthopedic was consulted for possible need of surgical correction. Strict bedrest,  Continue adequate pain control. TOC consulted for possible need for placement. Orthopedics is consulted, no plan for surgical intervention.   Weightbearing as tolerated left leg.   PT and OT recommended SNF. Follow-up orthopedics as an outpatient.   Acute metabolic encephalopathy: Urinary tract infection: Patient reportedly had been more confused than normal over the last couple of days. Daughter reports leaning more towards his left.    CT scan of the head did not note any acute abnormality. Continue Delirium precautions UA positive.  Could be contributing.   Has received ceftriaxone for 3 days. Patient back to baseline mental status.    Lower extremity swelling: He was noted to have more swelling in his lower extremities recently. Patient had negative Doppler ultrasound of the lower extremities back in July of this year.   Lower suspicion for DVT but still a possibility given that he is mostly wheelchair-bound. BNP 165.2 Venous duplex > No evidence of DVT in both lower extremities.   Thrombocytosis: Likely reactive, now back to normal.   History of stroke: Hyperlipidemia: Continue statin Plan to resume Plavix    Bronchiectasis No significant wheezes or rhonchi appreciated at this  time. Incentive  spirometry Continue Breathing treatments as needed   Cognitive impairment: Delirium precautions.   BPH: Continue Flomax .   GERD: Continue Pepcid .  Discharge Instructions  Discharge Instructions     Call MD for:  difficulty breathing, headache or visual disturbances   Complete by: As directed    Call MD for:  persistant dizziness or light-headedness   Complete by: As directed    Call MD for:  persistant nausea and vomiting   Complete by: As directed    Diet - low sodium heart healthy   Complete by: As directed    Diet general   Complete by: As directed    Discharge instructions   Complete by: As directed    Advised to follow-up with primary care physician in 1 week. Advised to follow-up with orthopedics as scheduled as needed. Advised nonoperative management for fracture.   Increase activity slowly   Complete by: As directed       Allergies as of 04/05/2024       Reactions   Codeine Other (See Comments)   Unknown reaction Not documented at SNF        Medication List     TAKE these medications    acetaminophen  325 MG tablet Commonly known as: TYLENOL  Take 650 mg by mouth in the morning, at noon, and at bedtime.   clopidogrel  75 MG tablet Commonly known as: PLAVIX  Take 1 tablet (75 mg total) by mouth daily.   docusate sodium  100 MG capsule Commonly known as: COLACE Take 1 capsule (100 mg total) by mouth 2 (two) times daily.   famotidine  20 MG tablet Commonly known as: PEPCID  Take 1 tablet (20 mg total) by mouth daily.   furosemide 20 MG tablet Commonly known as: LASIX Take 20 mg by mouth every Monday, Wednesday, and Friday.   HYDROcodone -acetaminophen  5-325 MG tablet Commonly known as: NORCO/VICODIN Take 1 tablet by mouth every 6 (six) hours as needed for up to 3 days for moderate pain (pain score 4-6).   melatonin 3 MG Tabs tablet Take 1 tablet (3 mg total) by mouth at bedtime.   multivitamin with minerals Tabs tablet Take 1 tablet by mouth  daily.   polyethylene glycol powder 17 GM/SCOOP powder Commonly known as: GLYCOLAX /MIRALAX  Take 17 g by mouth in the morning and at bedtime.   rosuvastatin  20 MG tablet Commonly known as: CRESTOR  Take 1 tablet (20 mg total) by mouth daily.   tamsulosin  0.4 MG Caps capsule Commonly known as: FLOMAX  Take 1 capsule (0.4 mg total) by mouth daily after supper. What changed: when to take this   Vitamin D  (Ergocalciferol ) 1.25 MG (50000 UNIT) Caps capsule Commonly known as: DRISDOL  Take 1 capsule (50,000 Units total) by mouth every 7 (seven) days. What changed: when to take this               Durable Medical Equipment  (From admission, onward)           Start     Ordered   04/05/24 1107  For home use only DME oxygen  Once       Question Answer Comment  Length of Need Lifetime   Mode or (Route) Nasal cannula   Liters per Minute 3   Frequency Continuous (stationary and portable oxygen unit needed)   Oxygen conserving device Yes   Oxygen delivery system: Gas   Oxygen delivery system: Concentrator      04/05/24 1106  Follow-up Information     Shepperson, Kirstin, PA-C Follow up in 1 week(s).   Specialty: Family Medicine Contact information: [address] [place] [address] [phone]         Deward Eck, PA-C Follow up in 1 week(s).   Specialty: Orthopedic Surgery Contact information: [address] [place] [address] [phone]                Allergies  Allergen Reactions   Codeine Other (See Comments)    Unknown reaction Not documented at SNF    Consultations: Orthopeadics   Procedures/Studies: DG Hand 2 View Left Result Date: 04/04/2024 EXAM: 1 or 2 VIEW(S) XRAY OF THE LEFT HAND 04/04/2024 02:46:00 PM COMPARISON: None available. CLINICAL HISTORY: Pain FINDINGS: BONES AND JOINTS: No acute fracture. No focal osseous lesion. No joint dislocation. Mild osteoarthritis of the first CMC joint. Mild  osteoarthritis of multiple interphalangeal joints. SOFT TISSUES: The soft tissues are unremarkable. IMPRESSION: 1. Mild osteoarthritis of the first CMC joint. 2. Mild osteoarthritis of multiple interphalangeal joints. Electronically signed by: Donnice Mania MD 04/04/2024 09:43 PM EST RP Workstation: [id number]   VAS US  LOWER EXTREMITY VENOUS (DVT) Result Date: 04/03/2024  Lower Venous DVT Study Patient Name:  Samuel Mata  Date of Exam:   04/02/2024 Medical Rec #: [id number]      Accession #:    [id number] Date of Birth: 1930/09/04     Patient Gender: M Patient Age:   17 years Exam Location:  [facility] Procedure:      VAS US  LOWER EXTREMITY VENOUS (DVT) Referring Phys: MAXIMINO SHARPS --------------------------------------------------------------------------------  Indications: Swelling. Other Indications: Left femoral neck fracture. Limitations: Altered mental status, somnolence. Comparison Study: Prior negative left LEV done 12/11/23 Performing Technologist: Alberta Lis RVS  Examination Guidelines: A complete evaluation includes B-mode imaging, spectral Doppler, color Doppler, and power Doppler as needed of all accessible portions of each vessel. Bilateral testing is considered an integral part of a complete examination. Limited examinations for reoccurring indications may be performed as noted. The reflux portion of the exam is performed with the patient in reverse Trendelenburg.  +---------+---------------+---------+-----------+----------+---------------+ RIGHT    CompressibilityPhasicitySpontaneityPropertiesThrombus Aging  +---------+---------------+---------+-----------+----------+---------------+ CFV      Full           Yes      Yes                                  +---------+---------------+---------+-----------+----------+---------------+ SFJ      Full                                                          +---------+---------------+---------+-----------+----------+---------------+ FV Prox  Full           Yes      Yes                                  +---------+---------------+---------+-----------+----------+---------------+ FV Mid   Full                                                         +---------+---------------+---------+-----------+----------+---------------+  FV DistalFull           Yes      Yes                                  +---------+---------------+---------+-----------+----------+---------------+ PFV      Full                                                         +---------+---------------+---------+-----------+----------+---------------+ POP      Full           Yes      Yes                                  +---------+---------------+---------+-----------+----------+---------------+ PTV                                                   Patent by color +---------+---------------+---------+-----------+----------+---------------+ PERO     Full                                                         +---------+---------------+---------+-----------+----------+---------------+   +---------+---------------+---------+-----------+----------+--------------+ LEFT     CompressibilityPhasicitySpontaneityPropertiesThrombus Aging +---------+---------------+---------+-----------+----------+--------------+ CFV      Full           Yes      Yes                                 +---------+---------------+---------+-----------+----------+--------------+ SFJ      Full                                                        +---------+---------------+---------+-----------+----------+--------------+ FV Prox  Full           Yes      Yes                                 +---------+---------------+---------+-----------+----------+--------------+ FV Mid   Full                                                         +---------+---------------+---------+-----------+----------+--------------+ FV DistalFull                                                        +---------+---------------+---------+-----------+----------+--------------+ PFV  Full                                                        +---------+---------------+---------+-----------+----------+--------------+ POP      Full           Yes      Yes                                 +---------+---------------+---------+-----------+----------+--------------+ PTV      Full                                                        +---------+---------------+---------+-----------+----------+--------------+ PERO     Full                                                        +---------+---------------+---------+-----------+----------+--------------+     Summary: BILATERAL: - No evidence of deep vein thrombosis seen in the lower extremities, bilaterally. -No evidence of popliteal cyst, bilaterally.   *See table(s) above for measurements and observations. Electronically signed by Debby Robertson on 04/03/2024 at 10:00:24 AM.    Final    CT PELVIS WO CONTRAST Result Date: 04/01/2024 EXAM: CT Pelvis, Without IV Contrast 04/01/2024 08:25:51 AM TECHNIQUE: Axial images were acquired through the pelvis without IV contrast. Reformatted images were reviewed. Automated exposure control, iterative reconstruction, and/or weight based adjustment of the mA/kV was utilized to reduce the radiation dose to as low as reasonably achievable. COMPARISON: AP radiograph of the pelvis dated 04/01/2024 and CT of the pelvis dated 12/11/2023. CLINICAL HISTORY: Hip trauma, fracture suspected, xray done. FINDINGS: BONES: New buckling of the cortex at the junction of the left femoral head and neck on the axial and coronal images, compatible with a nondisplaced subcapital femoral neck fracture. Status post orthopedic fixation of a basicervical and intertrochanteric  fracture, treated with an orthopedic screw within the femoral head and neck and an anchoring rod within the proximal femoral shaft. The orthopedic hardware is intact. The bony pelvis is intact. JOINTS: No dislocation. The joint spaces are normal. SOFT TISSUES: The soft tissues are unremarkable. INTRAPELVIC CONTENTS: Limited images of the intrapelvic contents demonstrate no acute abnormality. IMPRESSION: 1. New nondisplaced subcapital femoral neck fracture on the left 2. Status post orthopedic fixation of prior basicervical/intertrochanteric left femur fracture with intact hardware Electronically signed by: Evalene Coho MD 04/01/2024 08:37 AM EST RP Workstation: [id number]   DG Pelvis Portable Result Date: 04/01/2024 EXAM: 1 or 2 VIEW(S) XRAY OF THE PELVIS 04/01/2024 06:29:00 AM COMPARISON: CT of the pelvis 12/11/2023. CLINICAL HISTORY: fall FINDINGS: BONES AND JOINTS: Osteopenia is present. Left hip nailing hardware is similar in positioning. The intertrochanteric fracture noted on the prior study, which was in the early stages of healing, appears to have healed. New from the prior study, there is a subtle cortical step-off at the junction of the superior head and neck of the left femur suspicious for a new fracture such  as a subcapital proximal left femoral fracture. There is no other evidence of fractures. Joint spaces are maintained. No AP evidence of pelvic fracture or diastasis. SOFT TISSUES: Calcific plaques in both proximal femoral arteries. IMPRESSION: 1. Subtle cortical step-off at the superior left femoral head-neck junction suspicious for a new subcapital proximal left femoral fracture. 2. Left hip nailing hardware in similar positioning. Electronically signed by: Francis Quam MD 04/01/2024 07:33 AM EST RP Workstation: [id number]   CT Cervical Spine Wo Contrast Result Date: 04/01/2024 EXAM: CT CERVICAL SPINE WITHOUT CONTRAST 04/01/2024 06:45:07 AM TECHNIQUE: CT of the cervical spine was  performed without the administration of intravenous contrast. Multiplanar reformatted images are provided for review. Automated exposure control, iterative reconstruction, and/or weight based adjustment of the mA/kV was utilized to reduce the radiation dose to as low as reasonably achievable. COMPARISON: Cervical spine CT 12/08/2023. CLINICAL HISTORY: Neck trauma (Age >= 65y). FINDINGS: CERVICAL SPINE: BONES AND ALIGNMENT: There is osteopenia without evidence of fractures or focal pathologic process. There is chronic narrowing and spur formation of the anterior atlantoaxial joint. Chronic minimal listhesis at C4-C5 likely degenerative. No traumatic or new alignment abnormality is seen. 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 DEGENERATIVE CHANGES: Degenerative changes are again noted, greatest at C5-C6 and C6-C7 where the discs are collapsed and there are small bidirectional endplate spurs. A mild chronic disc bulge is again noted at C3-C4, but again is nonstenosing. There are facet joint and uncinate spurs at most levels but no significant foraminal stenosis. No significant soft tissue or bony encroachment of the thecal sac is seen. SOFT TISSUES: No prevertebral soft tissue swelling. There are calcific plaques at the carotid bifurcations. Lung apices show scarring changes without pneumothorax. IMPRESSION: 1. Osteopenia and degenerative change without evidence of fractures. 2. Atherosclerotic calcifications at the carotid bifurcations. Electronically signed by: Francis Quam MD 04/01/2024 07:26 AM EST RP Workstation: [id number]   CT HEAD WO CONTRAST ( ) Result Date: 04/01/2024 EXAM: CT HEAD WITHOUT CONTRAST 04/01/2024 06:45:07 AM TECHNIQUE: CT of the head was performed without the administration of intravenous contrast. Automated exposure control, iterative reconstruction, and/or weight based adjustment of the mA/kV was utilized to reduce the radiation dose to as low as reasonably  achievable. COMPARISON: Head CT from 12/08/2023. CLINICAL HISTORY: Head trauma, minor (Age >= 65y). Un witnessed fall. Currently on blood thinner medication. FINDINGS: BRAIN AND VENTRICLES: There are chronic lacunar infarcts of the junction of the right thalamus and internal capsule and in the superior aspect of the left cerebellar hemisphere. A chronic lacunar infarct is also noted in the right frontal white matter. There are dystrophic calcifications along the falx. There is moderately advanced cerebral and mild cerebellar atrophy, atrophic ventriculomegaly, and moderate to severe small vessel disease of the cerebral white matter. No acute hemorrhage. No cortical infarct. No extra-axial collection. No mass effect or midline shift. There is atherosclerosis in the siphons and distal vertebral arteries. No hyperdense central vessels. ORBITS: No acute abnormality. SINUSES: There is mucosal disease in the ethmoid air cells. Other sinuses, bilateral mastoid air cells, and middle ears are clear. SOFT TISSUES AND SKULL: There is a left parietal scalp contusion without a large hematoma. There are no depressed skull fractures. IMPRESSION: 1. No acute intracranial CT findings or depressed skull fractures. 2. Left parietal scalp contusion. 3. Moderately advanced cerebral and mild cerebellar atrophy with atrophic ventriculomegaly, and moderate to severe chronic small vessel ischemic disease of the cerebral white matter. Stable exam. 4. Chronic lacunar  infarcts. 5. Intracranial atherosclerosis. Electronically signed by: Francis Quam MD 04/01/2024 07:17 AM EST RP Workstation: [id number]   DG Chest Port 1 View Result Date: 04/01/2024 EXAM: 1 VIEW(S) XRAY OF THE CHEST 04/01/2024 06:29:00 AM COMPARISON: Portable chest 11/04/2023. CLINICAL HISTORY: fall fall FINDINGS: LINES, TUBES AND DEVICES: Loop recorder device again in the left chest wall. LUNGS AND PLEURA: Advanced pulmonary fibrosis without evidence of a focal or new  infiltrate. No vascular congestion is seen. No substantial pleural effusion. No pneumothorax. HEART AND MEDIASTINUM: Mild cardiomegaly. Stable mediastinal contour with aortic tortuosity and atherosclerosis. BONES AND SOFT TISSUES: Osteopenia and thoracic spondylosis. Mild upper thoracic dextroscoliosis. Chronic wedging and kyphoplasty of a lower thoracic segment. IMPRESSION: 1. No acute cardiopulmonary findings. 2. Advanced pulmonary fibrosis without new superimposed infiltrate. 3. Mild cardiomegaly without vascular congestion. Electronically signed by: Francis Quam MD 04/01/2024 07:02 AM EST RP Workstation: [id number]   CUP PACEART REMOTE DEVICE CHECK Result Date: 03/29/2024 ILR summary report received. Battery status OK. Normal device function. No new symptom, tachy, brady, or pause episodes. No new AF episodes. Monthly summary reports and ROV/PRN.  No histogram data, acceptable HR graph LA, CVRS   Subjective: Patient was seen and examined at bedside.  Overnight events noted. Patient reports feeling much better,  back to his baseline mental status.   Patient is being discharged to assisted living facility today.  Discharge Exam: Vitals:   04/05/24 0407 04/05/24 0736  BP: (!) 162/86 (!) 147/82  Pulse: 72 89  Resp:    Temp: 98.8 F (37.1 C) 98.4 F (36.9 C)  SpO2: 90% (!) 87%   Vitals:   04/04/24 1457 04/04/24 2005 04/05/24 0407 04/05/24 0736  BP: (!) 162/87 (!) 161/71 (!) 162/86 (!) 147/82  Pulse: 83 86 72 89  Resp: 18 16    Temp: 98.3 F (36.8 C) 98.3 F (36.8 C) 98.8 F (37.1 C) 98.4 F (36.9 C)  TempSrc: Oral Oral Oral Oral  SpO2: 96% (!) 80% 90% (!) 87%  Weight:      Height:        General: Pt is alert, awake, not in acute distress Cardiovascular: RRR, S1/S2 +, no rubs, no gallops Respiratory: CTA bilaterally, no wheezing, no rhonchi Abdominal: Soft, NT, ND, bowel sounds + Extremities: no edema, no cyanosis    The results of significant diagnostics from this  hospitalization (including imaging, microbiology, ancillary and laboratory) are listed below for reference.     Microbiology: Recent Results (from the past 240 hours)  Urine Culture     Status: Abnormal   Collection Time: 04/02/24  8:42 PM   Specimen: Urine, Random  Result Value Ref Range Status   Specimen Description URINE, RANDOM  Final   Special Requests   Final    NONE Reflexed from [id number] Performed at [facility], [address], [place], [place] [address]    Culture 30,000 COLONIES/mL STAPHYLOCOCCUS EPIDERMIDIS (A)  Final   Report Status 04/05/2024 FINAL  Final   Organism ID, Bacteria STAPHYLOCOCCUS EPIDERMIDIS (A)  Final      Susceptibility   Staphylococcus epidermidis - MIC*    CIPROFLOXACIN <=0.5 SENSITIVE Sensitive     GENTAMICIN <=0.5 SENSITIVE Sensitive     NITROFURANTOIN <=16 SENSITIVE Sensitive     OXACILLIN RESISTANT Resistant     TETRACYCLINE 2 SENSITIVE Sensitive     VANCOMYCIN <=0.5 SENSITIVE Sensitive     TRIMETH /SULFA  160 RESISTANT Resistant     RIFAMPIN <=0.5 SENSITIVE Sensitive     Inducible Clindamycin NEGATIVE  Sensitive     * 30,000 COLONIES/mL STAPHYLOCOCCUS EPIDERMIDIS     Labs: BNP (last 3 results) Recent Labs    04/01/24 1634  BNP 165.2*   Basic Metabolic Panel: Recent Labs  Lab 04/01/24 0551 04/02/24 0418  NA 138 141  K 3.7 3.7  CL 103 103  CO2 24 28  GLUCOSE 88 81  BUN 22 22  CREATININE 0.96 1.03  CALCIUM  9.0 8.7*   Liver Function Tests: No results for input(s): AST, ALT, ALKPHOS, BILITOT, PROT, ALBUMIN in the last 168 hours. No results for input(s): LIPASE, AMYLASE in the last 168 hours. No results for input(s): AMMONIA in the last 168 hours. CBC: Recent Labs  Lab 04/01/24 0551 04/02/24 0418  WBC 9.9 9.1  NEUTROABS 7.0  --   HGB 13.1 12.3*  HCT 39.7 38.7*  MCV 94.7 96.8  PLT 425* 375   Cardiac Enzymes: No results for input(s): CKTOTAL, CKMB, CKMBINDEX, TROPONINI in the last 168  hours. BNP: Invalid input(s): POCBNP CBG: No results for input(s): GLUCAP in the last 168 hours. D-Dimer No results for input(s): DDIMER in the last 72 hours. Hgb A1c No results for input(s): HGBA1C in the last 72 hours. Lipid Profile No results for input(s): CHOL, HDL, LDLCALC, TRIG, CHOLHDL, LDLDIRECT in the last 72 hours. Thyroid function studies No results for input(s): TSH, T4TOTAL, T3FREE, THYROIDAB in the last 72 hours.  Invalid input(s): FREET3 Anemia work up No results for input(s): VITAMINB12, FOLATE, FERRITIN, TIBC, IRON, RETICCTPCT in the last 72 hours. Urinalysis    Component Value Date/Time   COLORURINE YELLOW 04/02/2024 2042   APPEARANCEUR CLOUDY (A) 04/02/2024 2042   LABSPEC 1.020 04/02/2024 2042   PHURINE 6.0 04/02/2024 2042   GLUCOSEU NEGATIVE 04/02/2024 2042   HGBUR SMALL (A) 04/02/2024 2042   BILIRUBINUR NEGATIVE 04/02/2024 2042   BILIRUBINUR negative 12/11/2022 1225   KETONESUR 5 (A) 04/02/2024 2042   PROTEINUR 30 (A) 04/02/2024 2042   UROBILINOGEN 0.2 12/11/2022 1225   NITRITE NEGATIVE 04/02/2024 2042   LEUKOCYTESUR LARGE (A) 04/02/2024 2042   Sepsis Labs Recent Labs  Lab 04/01/24 0551 04/02/24 0418  WBC 9.9 9.1   Microbiology Recent Results (from the past 240 hours)  Urine Culture     Status: Abnormal   Collection Time: 04/02/24  8:42 PM   Specimen: Urine, Random  Result Value Ref Range Status   Specimen Description URINE, RANDOM  Final   Special Requests   Final    NONE Reflexed from [id number] Performed at [facility], [address], [place], [place] [address]    Culture 30,000 COLONIES/mL STAPHYLOCOCCUS EPIDERMIDIS (A)  Final   Report Status 04/05/2024 FINAL  Final   Organism ID, Bacteria STAPHYLOCOCCUS EPIDERMIDIS (A)  Final      Susceptibility   Staphylococcus epidermidis - MIC*    CIPROFLOXACIN <=0.5 SENSITIVE Sensitive     GENTAMICIN <=0.5 SENSITIVE Sensitive     NITROFURANTOIN <=16  SENSITIVE Sensitive     OXACILLIN RESISTANT Resistant     TETRACYCLINE 2 SENSITIVE Sensitive     VANCOMYCIN <=0.5 SENSITIVE Sensitive     TRIMETH /SULFA  160 RESISTANT Resistant     RIFAMPIN <=0.5 SENSITIVE Sensitive     Inducible Clindamycin NEGATIVE Sensitive     * 30,000 COLONIES/mL STAPHYLOCOCCUS EPIDERMIDIS     Time coordinating discharge: Over 30 minutes  SIGNED:   Darcel Dawley, MD  [facility] 04/05/2024, 12:50 PM Pager   If 7PM-7AM, please contact night-coverage

## 2024-04-05 NOTE — Discharge Instructions (Signed)
 Advised to follow-up with primary care physician in 1 week. Advised to follow-up with orthopedics as scheduled as needed. Advised nonoperative management for fracture.

## 2024-04-05 NOTE — TOC Progression Note (Signed)
 Transition of Care [facility]) - Progression Note    Patient Details  Name: Samuel Mata MRN: [id number] Date of Birth: 10/24/1930  Transition of Care [facility]) CM/SW Contact  Rosalva Jon Bloch, RN Phone Number: 04/05/2024, 12:12 PM  Clinical Narrative:     Pt with oxygen need. Pt agreeable to home oxygen. Pt without provider preference. Referral made with Mitch @ [employer] for home DME: oxygen. Equipment will be delivered to [facility], rm [address]. Per Mitch request for task to be expedited. Once equipment has delivered to [facility]. PTAR  can be requested for pt's transportation to ALF.  ICM team following and will continue assist with needs...  Expected Discharge Plan: Assisted Living [facility]) Barriers to Discharge: Continued Medical Work up, Other (must enter comment) (currently in restraints)               Expected Discharge Plan and Services In-house Referral: Clinical Social Work   Post Acute Care Choice: Resumption of Svcs/PTA Provider ([facility] with PT) Living arrangements for the past 2 months: Assisted Living Facility Expected Discharge Date: 04/05/24               DME Arranged: Oxygen DME Agency: [employer] Date DME Agency Contacted: 04/05/24 Time DME Agency Contacted: 1211 Representative spoke with at DME Agency: Thomasina             Social Drivers of Health (SDOH) Interventions SDOH Screenings   Food Insecurity: No Food Insecurity (04/01/2024)  Housing: Low Risk  (04/01/2024)  Transportation Needs: No Transportation Needs (04/01/2024)  Utilities: Not At Risk (04/01/2024)  Depression (PHQ2-9): Low Risk  (07/01/2023)  Financial Resource Strain: Low Risk  (03/20/2024)   Received from [facility]  Physical Activity: Insufficiently Active (03/20/2024)   Received from [facility]  Social Connections: Socially Integrated (03/20/2024)   Received from [facility]  Stress: Stress Concern Present (03/20/2024)   Received from [facility]  Tobacco Use: Medium  Risk (04/03/2024)    Readmission Risk Interventions     No data to display

## 2024-04-05 NOTE — TOC Progression Note (Addendum)
 Transition of Care [facility]) - Progression Note    Patient Details  Name: Samuel Mata MRN: [id number] Date of Birth: [birth year]  Transition of Care [facility]) CM/SW Contact  Bridget Cordella Simmonds, LCSW Phone Number: 04/05/2024, 1:57 PM  Clinical Narrative:   Message from Melissa/[facility]: she has reviewed paperwork and can receive pt today.  CSW informed her pt will need home O2. She is requesting [facility] RN be added to order for O2.  MD informed.  Per PT, pt does require ambulance transport.   1320: DC summary, new HH order emailed to [facility].  Message with Mitch[facility]: they are in process of setting up home O2 currently, will be done shortly.     Expected Discharge Plan: Assisted Living [facility]) Barriers to Discharge: Continued Medical Work up, Other (must enter comment) (currently in restraints)               Expected Discharge Plan and Services In-house Referral: Clinical Social Work   Post Acute Care Choice: Resumption of Svcs/PTA Provider ([facility] with PT) Living arrangements for the past 2 months: Assisted Living Facility Expected Discharge Date: 04/05/24               DME Arranged: Oxygen DME Agency: [employer] Date DME Agency Contacted: 04/05/24 Time DME Agency Contacted: 1211 Representative spoke with at DME Agency: Thomasina             Social Drivers of Health (SDOH) Interventions SDOH Screenings   Food Insecurity: No Food Insecurity (04/01/2024)  Housing: Low Risk  (04/01/2024)  Transportation Needs: No Transportation Needs (04/01/2024)  Utilities: Not At Risk (04/01/2024)  Depression (PHQ2-9): Low Risk  (07/01/2023)  Financial Resource Strain: Low Risk  (03/20/2024)   Received from [facility]  Physical Activity: Insufficiently Active (03/20/2024)   Received from [facility]  Social Connections: Socially Integrated (03/20/2024)   Received from [facility]  Stress: Stress Concern Present (03/20/2024)   Received from [facility]  Tobacco Use: Medium  Risk (04/03/2024)    Readmission Risk Interventions     No data to display

## 2024-04-05 NOTE — Plan of Care (Signed)
  Problem: Education: Goal: Knowledge of General Education information will improve Description: Including pain rating scale, medication(s)/side effects and non-pharmacologic comfort measures Outcome: Adequate for Discharge   Problem: Health Behavior/Discharge Planning: Goal: Ability to manage health-related needs will improve Outcome: Adequate for Discharge   Problem: Clinical Measurements: Goal: Ability to maintain clinical measurements within normal limits will improve Outcome: Adequate for Discharge Goal: Will remain free from infection Outcome: Adequate for Discharge Goal: Diagnostic test results will improve Outcome: Adequate for Discharge Goal: Respiratory complications will improve Outcome: Adequate for Discharge Goal: Cardiovascular complication will be avoided Outcome: Adequate for Discharge   Problem: Activity: Goal: Risk for activity intolerance will decrease Outcome: Adequate for Discharge   Problem: Nutrition: Goal: Adequate nutrition will be maintained Outcome: Adequate for Discharge   Problem: Coping: Goal: Level of anxiety will decrease Outcome: Adequate for Discharge   Problem: Elimination: Goal: Will not experience complications related to bowel motility Outcome: Adequate for Discharge Goal: Will not experience complications related to urinary retention Outcome: Adequate for Discharge   Problem: Pain Managment: Goal: General experience of comfort will improve and/or be controlled Outcome: Adequate for Discharge   Problem: Safety: Goal: Ability to remain free from injury will improve Outcome: Adequate for Discharge   Problem: Skin Integrity: Goal: Risk for impaired skin integrity will decrease Outcome: Adequate for Discharge   Problem: Safety: Goal: Non-violent Restraint(s) Outcome: Adequate for Discharge   Problem: Safety: Goal: Non-violent Restraint(s) Outcome: Adequate for Discharge

## 2024-04-05 NOTE — Progress Notes (Signed)
 Reviewed Discharge AVS and gave report to Nurse Eleanor from [facility] and family members.   Selinda Leiter, RN

## 2024-04-05 NOTE — Progress Notes (Addendum)
 SATURATION QUALIFICATIONS: (This note is used to comply with regulatory documentation for home oxygen)  Patient Saturations on Room Air at Rest = 87%  Patient Saturations on Room Air while Ambulating =  Pt is nonambulatory, 87%  Patient Saturations on 2l Liters of oxygen @ rest = 93%  Please briefly explain why patient needs home oxygen: Pt is non ambulatory. Sat's noted @ 87% on RA resting in bed.

## 2024-04-05 NOTE — TOC Transition Note (Signed)
 Transition of Care [facility]) - Discharge Note   Patient Details  Name: Samuel Mata MRN: [id number] Date of Birth: 19-Mar-1931  Transition of Care [facility]) CM/SW Contact:  Bridget Cordella Simmonds, LCSW Phone Number: 04/05/2024, 2:01 PM   Clinical Narrative:   Pt discharging to [facility]. RN report to [phone].   PTAR called [phone].   Final next level of care: Assisted Living Barriers to Discharge: Barriers Resolved   Patient Goals and CMS Choice     Choice offered to / list presented to : Adult Children (daughter [name])      Discharge Placement              Patient chooses bed at:  ([facility]) Patient to be transferred to facility by: ptar Name of family member notified: daughter [name] Patient and family notified of of transfer: 04/05/24  Discharge Plan and Services Additional resources added to the After Visit Summary for   In-house Referral: Clinical Social Work   Post Acute Care Choice: Resumption of Svcs/PTA Provider ([facility] with PT)          DME Arranged: Oxygen DME Agency: [facility] Date DME Agency Contacted: 04/05/24 Time DME Agency Contacted: [id number] Representative spoke with at DME Agency: Thomasina            Social Drivers of Health (SDOH) Interventions SDOH Screenings   Food Insecurity: No Food Insecurity (04/01/2024)  Housing: Low Risk  (04/01/2024)  Transportation Needs: No Transportation Needs (04/01/2024)  Utilities: Not At Risk (04/01/2024)  Depression (PHQ2-9): Low Risk  (07/01/2023)  Financial Resource Strain: Low Risk  (03/20/2024)   Received from [facility]  Physical Activity: Insufficiently Active (03/20/2024)   Received from [facility]  Social Connections: Socially Integrated (03/20/2024)   Received from [facility]  Stress: Stress Concern Present (03/20/2024)   Received from [facility]  Tobacco Use: Medium Risk (04/03/2024)     Readmission Risk Interventions     No data to display

## 2024-04-05 NOTE — Progress Notes (Signed)
 Physical Therapy Treatment Patient Details Name: Samuel Mata MRN: [id number] DOB: [birth year] Today's Date: 04/05/2024   History of Present Illness Pt is 89 yo presenting to [facility] on 11/16 due to a fall and metabolic encephalopathy/confusion. Pt is 4 months status post IM nailing of L Hip. CT with findings of cortical irregularity but no distinct fracture lines. PMH: bronchiectasis, HTN, CVA, nephrolithiasis, nocardiosis pulmonary.    PT Comments  Pt resting in bed on arrival, pleasant and agreeable to session with good progress towards acute goals. Pt coming to sit EOB with CGA for safety and with increased time to complete. Pt abe to perform transfers sit<>stand with min A from EOB at lowest height. Pt with noted L lateral lean in sitting and initially in standing, requiring verbal and tactile cues to correct. Pt progressing gait this session for short household distance, with RW for support and up to mod A to maintain balance. Pt returning to supine at end of session in anticipation of PTAR transport. Current plan remains appropriate to address deficits and maximize functional independence and decrease caregiver burden. Pt continues to benefit from skilled PT services to progress toward functional mobility goals.     If plan is discharge home, recommend the following: Assistance with cooking/housework;A little help with walking and/or transfers;Assist for transportation;Supervision due to cognitive status   Can travel by private vehicle        Equipment Recommendations  None recommended by PT    Recommendations for Other Services       Precautions / Restrictions Precautions Precautions: Fall Recall of Precautions/Restrictions: Impaired Restrictions Weight Bearing Restrictions Per Provider Order: No Other Position/Activity Restrictions: WBAT     Mobility  Bed Mobility Overal bed mobility: Needs Assistance Bed Mobility: Supine to Sit, Sit to Supine     Supine to sit: Contact  guard Sit to supine: Min assist   General bed mobility comments: CGA to come to sitting EOB with increased time, min A to return LEs to bed at end fo session    Transfers Overall transfer level: Needs assistance Equipment used: Rolling walker (2 wheels) Transfers: Sit to/from Stand Sit to Stand: Min assist           General transfer comment: verbal cues for hand placement and min A to boost on rise, initial L lateral lean with narrow BOS, improving with tactile cues. standing from EOB at lowest height x2    Ambulation/Gait Ambulation/Gait assistance: Mod assist Gait Distance (Feet): 20 Feet Assistive device: Rolling walker (2 wheels) Gait Pattern/deviations: Step-through pattern, Shuffle, Trunk flexed Gait velocity: decr   Pre-gait activities: standing marching x20 General Gait Details: slow antalgic gait with flexed trunk, mod A to maintain balance, cues for more upright posture, noted increased knee flexion with distance   Stairs             Wheelchair Mobility     Tilt Bed    Modified Rankin (Stroke Patients Only)       Balance Overall balance assessment: Needs assistance Sitting-balance support: Bilateral upper extremity supported, Feet supported Sitting balance-Leahy Scale: Fair Sitting balance - Comments: CGA; initially Min to MOd A for trunk in mid line Postural control: Left lateral lean, Posterior lean Standing balance support: Reliant on assistive device for balance, Bilateral upper extremity supported Standing balance-Leahy Scale: Poor Standing balance comment: RW and min A to maintain balance  Communication Communication Communication: Impaired Factors Affecting Communication: Hearing impaired;Reduced clarity of speech  Cognition Arousal: Alert Behavior During Therapy: WFL for tasks assessed/performed   PT - Cognitive impairments: No family/caregiver present to determine baseline, Orientation,  Awareness, Memory, Safety/Judgement, Problem solving, Sequencing, Attention                         Following commands: Impaired Following commands impaired: Only follows one step commands consistently, Follows one step commands with increased time    Cueing Cueing Techniques: Verbal cues, Tactile cues, Visual cues  Exercises Other Exercises Other Exercises: standing marching x20    General Comments General comments (skin integrity, edema, etc.): VSS on supplemental O2      Pertinent Vitals/Pain Pain Assessment Pain Assessment: Faces Faces Pain Scale: Hurts a little bit Pain Location: L hip with mobility Pain Descriptors / Indicators: Discomfort, Grimacing Pain Intervention(s): Monitored during session, Limited activity within patient's tolerance    Home Living                          Prior Function            PT Goals (current goals can now be found in the care plan section) Acute Rehab PT Goals PT Goal Formulation: Patient unable to participate in goal setting Time For Goal Achievement: 04/17/24 Progress towards PT goals: Progressing toward goals    Frequency    Min 1X/week      PT Plan      Co-evaluation              AM-PAC PT 6 Clicks Mobility   Outcome Measure  Help needed turning from your back to your side while in a flat bed without using bedrails?: A Lot Help needed moving from lying on your back to sitting on the side of a flat bed without using bedrails?: A Lot Help needed moving to and from a bed to a chair (including a wheelchair)?: A Lot Help needed standing up from a chair using your arms (e.g., wheelchair or bedside chair)?: A Little Help needed to walk in hospital room?: A Lot Help needed climbing 3-5 steps with a railing? : Total 6 Click Score: 12    End of Session Equipment Utilized During Treatment: Gait belt Activity Tolerance: Patient tolerated treatment well Patient left: in bed;with call bell/phone  within reach;with bed alarm set Nurse Communication: Mobility status PT Visit Diagnosis: Unsteadiness on feet (R26.81);Other abnormalities of gait and mobility (R26.89);Muscle weakness (generalized) (M62.81);History of falling (Z91.81);Pain Pain - Right/Left: Left Pain - part of body: Hip     Time: 1351-1410 PT Time Calculation (min) (ACUTE ONLY): 19 min  Charges:    $Therapeutic Activity: 8-22 mins PT General Charges $$ ACUTE PT VISIT: 1 Visit                     Samuel Mata R. PTA Acute Rehabilitation Services Office: [phone]   Samuel Mata 04/05/2024, 2:29 PM

## 2024-04-05 NOTE — Progress Notes (Signed)
   04/05/24 1220  Spiritual Encounters  Type of Visit Follow up  Care provided to: Patient;Family  Reason for visit Routine spiritual support   [facility]pl[facility] followed up with visit for Pt Samuel Mata and daughter Samuel Mata. Samuel Mata was eating lunch and indicated he would like a follow up visit later today. Samuel Mata expressed expected d/c date for Samuel Mata this afternoon. Would be open to a follow-up visit before d/c.

## 2024-04-16 ENCOUNTER — Ambulatory Visit: Payer: Self-pay | Admitting: Cardiovascular Disease

## 2024-04-16 ENCOUNTER — Encounter

## 2024-04-20 ENCOUNTER — Emergency Department (HOSPITAL_COMMUNITY)
Admission: EM | Admit: 2024-04-20 | Discharge: 2024-04-20 | Disposition: A | Discharge status: Home / Self Care | Attending: Emergency Medicine | Admitting: Emergency Medicine

## 2024-04-20 ENCOUNTER — Emergency Department (HOSPITAL_COMMUNITY)

## 2024-04-20 DIAGNOSIS — Z7902 Long term (current) use of antithrombotics/antiplatelets: Secondary | ICD-10-CM | POA: Insufficient documentation

## 2024-04-20 DIAGNOSIS — W1830XA Fall on same level, unspecified, initial encounter: Secondary | ICD-10-CM | POA: Insufficient documentation

## 2024-04-20 DIAGNOSIS — I10 Essential (primary) hypertension: Secondary | ICD-10-CM | POA: Insufficient documentation

## 2024-04-20 DIAGNOSIS — W19XXXA Unspecified fall, initial encounter: Secondary | ICD-10-CM

## 2024-04-20 DIAGNOSIS — Z8673 Personal history of transient ischemic attack (TIA), and cerebral infarction without residual deficits: Secondary | ICD-10-CM | POA: Insufficient documentation

## 2024-04-20 DIAGNOSIS — Z79899 Other long term (current) drug therapy: Secondary | ICD-10-CM | POA: Insufficient documentation

## 2024-04-20 DIAGNOSIS — R600 Localized edema: Secondary | ICD-10-CM | POA: Insufficient documentation

## 2024-04-20 LAB — BASIC METABOLIC PANEL WITH GFR
Anion gap: 11 (ref 5–15)
BUN: 16 mg/dL (ref 8–23)
CO2: 26 mmol/L (ref 22–32)
Calcium: 8.9 mg/dL (ref 8.9–10.3)
Chloride: 101 mmol/L (ref 98–111)
Creatinine, Ser: 1.05 mg/dL (ref 0.61–1.24)
GFR, Estimated: >60 mL/min (ref >60)
Glucose, Bld: 93 mg/dL (ref 70–99)
Potassium: 4.1 mmol/L (ref 3.5–5.1)
Sodium: 138 mmol/L (ref 135–145)

## 2024-04-20 LAB — CBC WITH DIFFERENTIAL/PLATELET
Abs Immature Granulocytes: 0.02 K/uL (ref 0.00–0.07)
Basophils Absolute: 0.1 K/uL (ref 0.0–0.1)
Basophils Relative: 1 %
Eosinophils Absolute: 0.1 K/uL (ref 0.0–0.5)
Eosinophils Relative: 2 %
HCT: 37.9 % — ABNORMAL LOW (ref 39.0–52.0)
Hemoglobin: 12.3 g/dL — ABNORMAL LOW (ref 13.0–17.0)
Immature Granulocytes: 0 %
Lymphocytes Relative: 11 %
Lymphs Abs: 0.8 K/uL (ref 0.7–4.0)
MCH: 31 pg (ref 26.0–34.0)
MCHC: 32.5 g/dL (ref 30.0–36.0)
MCV: 95.5 fL (ref 80.0–100.0)
Monocytes Absolute: 1.1 K/uL — ABNORMAL HIGH (ref 0.1–1.0)
Monocytes Relative: 15 %
Neutro Abs: 5.1 K/uL (ref 1.7–7.7)
Neutrophils Relative %: 71 %
Platelets: 467 K/uL — ABNORMAL HIGH (ref 150–400)
RBC: 3.97 MIL/uL — ABNORMAL LOW (ref 4.22–5.81)
RDW: 13.1 % (ref 11.5–15.5)
WBC: 7.2 K/uL (ref 4.0–10.5)
nRBC: 0 % (ref 0.0–0.2)

## 2024-04-20 LAB — URINALYSIS, ROUTINE W REFLEX MICROSCOPIC
Bilirubin Urine: NEGATIVE
Glucose, UA: NEGATIVE mg/dL
Hgb urine dipstick: NEGATIVE
Ketones, ur: NEGATIVE mg/dL
Leukocytes,Ua: NEGATIVE
Nitrite: NEGATIVE
Protein, ur: NEGATIVE mg/dL
Specific Gravity, Urine: 1.01 (ref 1.005–1.030)
pH: 7 (ref 5.0–8.0)

## 2024-04-20 LAB — BRAIN NATRIURETIC PEPTIDE: B Natriuretic Peptide: 160 pg/mL — ABNORMAL HIGH (ref 0.0–100.0)

## 2024-04-20 MED ORDER — FUROSEMIDE 10 MG/ML IJ SOLN
40.0000 mg | Freq: Once | INTRAMUSCULAR | Status: AC
  Administered 2024-04-20: 40 mg via INTRAVENOUS
  Filled 2024-04-20: qty 4

## 2024-04-20 NOTE — ED Notes (Signed)
 Patient discharged to home being transported via [place]. PTAR given discharge paperwork and patient report. Facility called by night RN. Family notified. Patient resting at this time. Patient breathing without difficulty.

## 2024-04-20 NOTE — ED Triage Notes (Signed)
 Unwitnessed fall from facility, sitting on the floor. Pos thinners, unkwn head injury. Endorses hip pain and back pain. Pt placed on c-spine precautions

## 2024-04-20 NOTE — ED Notes (Signed)
 Trauma Response Nurse Documentation   Samuel Mata is a 89 y.o. male arriving to [place] ED via EMS  On clopidogrel  75 mg daily. Trauma was activated as a Level 2 by ED charge RN based on the following trauma criteria Elderly patients > 65 with head trauma on anti-coagulation (excluding ASA).  Patient cleared for CT by Dr. Bari EDP. Pt transported to CT with trauma response nurse present to monitor. RN remained with the patient throughout their absence from the department for clinical observation.   GCS 13, baseline per EMS.    History   Past Medical History:  Diagnosis Date   Bronchiectasis without complication (HCC)    Enlarged prostate    Hearing loss    History of kidney stones    Hypertension    Pulmonary nocardiosis 2021   treated with 6 month antibiotic course   Stroke (HCC) 2022   left frontoparietal   TIA (transient ischemic attack) 2019     Past Surgical History:  Procedure Laterality Date   APPENDECTOMY     CHOLECYSTECTOMY     INTRAMEDULLARY (IM) NAIL INTERTROCHANTERIC Left 11/01/2023   Procedure: FIXATION, FRACTURE, INTERTROCHANTERIC, WITH INTRAMEDULLARY ROD;  Surgeon: Celena Sharper, MD;  Location: [facility] OR;  Service: Orthopedics;  Laterality: Left;   LOOP RECORDER INSERTION N/A 01/15/2021   Procedure: LOOP RECORDER INSERTION;  Surgeon: Kelsie Agent, MD;  Location: [facility] INVASIVE CV LAB;  Service: Cardiovascular;  Laterality: N/A;   NOSE SURGERY     SPINE SURGERY     compressoin fx T12, kyphoplasty   SPLENECTOMY, TOTAL         Initial Focused Assessment (If applicable, or please see trauma documentation): Confused male presents via EMS from SNF after unwitnessed fall, found down by staff sitting on his butt on the floor. C/o groin/left hip pain. No obvious trauma noted.  Airway patent, BS clear No obvious uncontrolled hemorrhag GCS 13 PERRLA 2  CT's Completed:   CT Head and CT C-Spine  CT pelvis Interventions:  Portable pelvis XRAY CT head and  c-spine IV start, trauma lab draw EKG  Plan for disposition:  Discharge home   Consults completed:  none  Event Summary: Presents via EMS from SNF after unwitnessed fall, found on the floor sitting on his butt. No obvious trauma noted. On plavix . Initial trauma scans negative. When pt attempted to ambulate, complained of left hip pain> CT pelvis added, negative as well. D/C home.  Bedside handoff with ED RN Marcelline.    Mckinley Olheiser O Yahsir Wickens  Trauma Response RN  Please call TRN at [phone] for further assistance.

## 2024-04-20 NOTE — Discharge Instructions (Signed)
 You were seen today after a fall.  There is no new injury noted.  You do have some lower extremity edema.  You were given 1 dose of IV Lasix .  Per your daughter you had increased Lasix  by your primary doctor.  Take as prescribed.

## 2024-04-20 NOTE — ED Notes (Signed)
 Daughter Samuel Mata called to notify of patient's transfer home per request. Samuel Mata verbalized understanding.

## 2024-04-20 NOTE — ED Provider Notes (Signed)
 [place] EMERGENCY DEPARTMENT AT [facility] Provider Note   CSN: [id number] Arrival date & time: 04/20/24  0241     Patient presents with: [place]   Samuel Mata is a 89 y.o. male.   HPI     This is a 89 year old male who presents from assisted living after a fall.  Fall was unwitnessed.  He reports reports that staff attempted to stand him up and he complained of pain of the left hip.  He is on Plavix .  He denies hitting his head.  Has significant lower extremity edema which was reported by EMS.  States that this is known and not new for the patient.  Patient denies any chest pain or shortness of breath.  Denies any loss of consciousness.  After initial evaluation, daughter presented to the bedside.  Has noted lower extremity edema but feels that has gotten worse.  Saw her primary doctor who was going to change his Lasix  medication but she is unsure whether that has started yet.  She is concerned that the lower extremity swelling may be affecting his ability to ambulate safely.  Prior to Admission medications   Medication Sig Start Date End Date Taking? Authorizing Provider  acetaminophen  (TYLENOL ) 325 MG tablet Take 650 mg by mouth in the morning, at noon, and at bedtime.    [provider]  clopidogrel  (PLAVIX ) 75 MG tablet Take 1 tablet (75 mg total) by mouth daily. 11/22/23   Angiulli, Toribio [name], PA-C  docusate sodium  (COLACE) 100 MG capsule Take 1 capsule (100 mg total) by mouth 2 (two) times daily. 11/22/23   Angiulli, Toribio [name], PA-C  famotidine  (PEPCID ) 20 MG tablet Take 1 tablet (20 mg total) by mouth daily. 11/22/23   Angiulli, Toribio [name], PA-C  furosemide  (LASIX ) 20 MG tablet Take 20 mg by mouth every Monday, Wednesday, and Friday.    [provider]  melatonin 3 MG TABS tablet Take 1 tablet (3 mg total) by mouth at bedtime. 11/22/23   Angiulli, Daniel J, PA-C  Multiple Vitamin (MULTIVITAMIN WITH MINERALS) TABS tablet Take 1 tablet by mouth daily. 11/06/23    Rashid, Farhan, MD  polyethylene glycol powder (GLYCOLAX /MIRALAX ) 17 GM/SCOOP powder Take 17 g by mouth in the morning and at bedtime.    [provider]  rosuvastatin  (CRESTOR ) 20 MG tablet Take 1 tablet (20 mg total) by mouth daily. 11/22/23   Angiulli, Toribio [name], PA-C  tamsulosin  (FLOMAX ) 0.4 MG CAPS capsule Take 1 capsule (0.4 mg total) by mouth daily after supper. Patient taking differently: Take 0.4 mg by mouth at bedtime. 11/22/23   Angiulli, Toribio [name], PA-C  Vitamin D , Ergocalciferol , (DRISDOL ) 1.25 MG (50000 UNIT) CAPS capsule Take 1 capsule (50,000 Units total) by mouth every 7 (seven) days. Patient taking differently: Take 50,000 Units by mouth every Thursday. 11/22/23   Angiulli, Toribio [name], PA-C    Allergies: Codeine    Review of Systems  Constitutional:  Negative for fever.  Respiratory:  Negative for shortness of breath.   Cardiovascular:  Positive for leg swelling. Negative for chest pain.  Musculoskeletal:        Left hip pain  All other systems reviewed and are negative.   Updated Vital Signs BP (!) 188/100   Pulse 97   Temp 97.9 F (36.6 C) (Axillary)   Resp 18   SpO2 91%   Physical Exam Vitals and nursing note reviewed.  Constitutional:      Appearance: He is well-developed.  Comments: Elderly, chronically ill-appearing, no acute distress  HENT:     Head: Normocephalic and atraumatic.     Nose: Nose normal.     Mouth/Throat:     Mouth: Mucous membranes are moist.  Eyes:     Pupils: Pupils are equal, round, and reactive to light.  Cardiovascular:     Rate and Rhythm: Normal rate and regular rhythm.     Heart sounds: Normal heart sounds. No murmur heard. Pulmonary:     Effort: Pulmonary effort is normal. No respiratory distress.     Breath sounds: Normal breath sounds. No wheezing.  Abdominal:     Palpations: Abdomen is soft.     Tenderness: There is no abdominal tenderness. There is no rebound.  Musculoskeletal:     Cervical back: Neck supple.      Right lower leg: Edema present.     Left lower leg: Edema present.     Comments: Pain with range of motion of the left hip, no obvious deformity or foreshortening  Lymphadenopathy:     Cervical: No cervical adenopathy.  Skin:    General: Skin is warm and dry.  Neurological:     Mental Status: He is alert.     Comments: Oriented to himself and place     (all labs ordered are listed, but only abnormal results are displayed) Labs Reviewed  CBC WITH DIFFERENTIAL/PLATELET - Abnormal; Notable for the following components:      Result Value   RBC 3.97 (*)    Hemoglobin 12.3 (*)    HCT 37.9 (*)    Platelets 467 (*)    Monocytes Absolute 1.1 (*)    All other components within normal limits  URINALYSIS, ROUTINE W REFLEX MICROSCOPIC - Abnormal; Notable for the following components:   Color, Urine STRAW (*)    All other components within normal limits  BRAIN NATRIURETIC PEPTIDE - Abnormal; Notable for the following components:   B Natriuretic Peptide 160.0 (*)    All other components within normal limits  BASIC METABOLIC PANEL WITH GFR    EKG: EKG Interpretation Date/Time:  Friday April 20 2024 02:42:27 EST Ventricular Rate:  93 PR Interval:  198 QRS Duration:  105 QT Interval:  356 QTC Calculation: 443 R Axis:   -73  Text Interpretation: Sinus rhythm Biatrial enlargement Incomplete RBBB and LAFB Abnormal R-wave progression, late transition Left ventricular hypertrophy Confirmed by Bari Pfeiffer ([id number]) on 04/20/2024 5:09:58 AM  Radiology: CT PELVIS WO CONTRAST Result Date: 04/20/2024 EXAM: CT PELVIS, WITHOUT IV CONTRAST 04/20/2024 04:52:14 AM TECHNIQUE: Axial images were acquired through the pelvis without IV contrast. Reformatted images were reviewed. Automated exposure control, iterative reconstruction, and/or weight based adjustment of the mA/kV was utilized to reduce the radiation dose to as low as reasonably achievable. COMPARISON: Plain film radiograph from earlier  today and CT pelvis on 04/01/24 and 12/11/23. CLINICAL HISTORY: Hip trauma, fracture suspected, xray done. FINDINGS: BONES: The bones are diffusely osteopenic. Moderate to severe degenerative disc disease noted within the imaged portions of the lumbar spine. The right hip appears intact. Previous IM rod and hip screw fixation of the left femur for remote intertrochanteric fractures of the proximal left femur. Unchanged appearance of subacute subcapital left femoral neck fracture compared with 04/01/2024. No new fracture identified. JOINTS: No dislocation. The joint spaces are normal. SOFT TISSUES: Extensive atherosclerotic calcifications identified. INTRAPELVIC CONTENTS: Limited images of the intrapelvic contents demonstrate no acute abnormality. IMPRESSION: 1. No acute osseous abnormality. 2. Unchanged subacute subcapital left femoral  neck fracture compared with 04/01/2024. No new fracture identified. Electronically signed by: Waddell Calk MD 04/20/2024 05:39 AM EST RP Workstation: [id number]   CT Cervical Spine Wo Contrast Result Date: 04/20/2024 EXAM: CT CERVICAL SPINE WITHOUT CONTRAST 04/20/2024 03:02:09 AM TECHNIQUE: CT of the cervical spine was performed without the administration of intravenous contrast. Multiplanar reformatted images are provided for review. Automated exposure control, iterative reconstruction, and/or weight based adjustment of the mA/kV was utilized to reduce the radiation dose to as low as reasonably achievable. COMPARISON: None available. CLINICAL HISTORY: Neck trauma (Age >= 65y) Neck trauma (Age >= 65y) FINDINGS: CERVICAL SPINE: BONES AND ALIGNMENT: No acute fracture or traumatic malalignment. DEGENERATIVE CHANGES: No significant degenerative changes. SOFT TISSUES: No prevertebral soft tissue swelling. IMPRESSION: 1. No acute abnormality of the cervical spine. Electronically signed by: Franky Stanford MD 04/20/2024 03:08 AM EST RP Workstation: [id number]   CT Head Wo Contrast Result  Date: 04/20/2024 EXAM: CT HEAD WITHOUT CONTRAST 04/20/2024 03:02:09 AM TECHNIQUE: CT of the head was performed without the administration of intravenous contrast. Automated exposure control, iterative reconstruction, and/or weight based adjustment of the mA/kV was utilized to reduce the radiation dose to as low as reasonably achievable. COMPARISON: None available. CLINICAL HISTORY: Head trauma, minor (Age >= 65y) FINDINGS: BRAIN AND VENTRICLES: No acute hemorrhage. No evidence of acute infarct. No hydrocephalus. No extra-axial collection. No mass effect or midline shift. Generalized volume loss. Chronic ischemic white matter changes. Full right thalamic small vessel infarct is unchanged. ORBITS: No acute abnormality. SINUSES: No acute abnormality. SOFT TISSUES AND SKULL: No acute soft tissue abnormality. No skull fracture. IMPRESSION: 1. No acute intracranial abnormality. 2. Unchanged chronic right thalamic small vessel infarct. 3. Generalized volume loss and chronic ischemic white matter changes. Electronically signed by: Franky Stanford MD 04/20/2024 03:06 AM EST RP Workstation: [id number]   DG Pelvis Portable Result Date: 04/20/2024 EXAM: 1 or 2 VIEW(S) XRAY OF THE PELVIS 04/20/2024 02:51:00 AM COMPARISON: None available. CLINICAL HISTORY: pain; fall FINDINGS: BONES AND JOINTS: Status post left hip ORIF with chronic avulsion of the lesser trochanter. Normal alignment. No acute fracture or dislocation. Mild asymmetric left hip degenerative arthritis with asymmetric joint space narrowing. SOFT TISSUES: The soft tissues are unremarkable. IMPRESSION: 1. No acute fracture or dislocation. 2. Status post left hip ORIF with chronic avulsion of the lesser trochanter. Electronically signed by: Dorethia Molt MD 04/20/2024 02:55 AM EST RP Workstation: [id number]     Procedures   Medications Ordered in the ED  furosemide  (LASIX ) injection 40 mg (40 mg Intravenous Given 04/20/24 0550)                                     Medical Decision Making Amount and/or Complexity of Data Reviewed Labs: ordered. Radiology: ordered.  Risk Prescription drug management.   This patient presents to the ED for concern of fall, this involves an extensive number of treatment options, and is a complaint that carries with it a high risk of complications and morbidity.  I considered the following differential and admission for this acute, potentially life threatening condition.  The differential diagnosis includes acute injury, head injury, long bone injury, hip injury  MDM:    This is a 89 year old male who presents after a fall.  Reportedly no witness.  Reports some left hip and groin pain.  Was unable to stand up.  However, daughter does report recent periprosthetic fracture.  Since that time  he has been engaged in physical therapy and has been ambulatory with a walker at his baseline.  Initially CT head, cervical spine, and pelvis were negative.  Attempted to stand the patient and he complained of pain.  CT pelvis ordered with left hip and labs were also ordered given daughter's concern for lower extremity edema.  Per daughter, he has recently had prescription changed for outpatient Lasix  but she does not believe that this has started yet.  He is not hypoxic.  Labs are reassuring.  Patient was given a dose of 40 of IV Lasix  per the daughter's request to help with lower extremity edema.  CT does not show any new injury but does show the old fracture.  (Labs, imaging, consults)  Labs: I Ordered, and personally interpreted labs.  The pertinent results include: CBC, BMP  Imaging Studies ordered: I ordered imaging studies including CT head, cervical spine, CT pelvis I independently visualized and interpreted imaging. I agree with the radiologist interpretation  Additional history obtained from daughter.  External records from outside source obtained and reviewed including prior evaluations  Cardiac Monitoring: The patient  was maintained on a cardiac monitor.  If on the cardiac monitor, I personally viewed and interpreted the cardiac monitored which showed an underlying rhythm of: Sinus  Reevaluation: After the interventions noted above, I reevaluated the patient and found that they have :stayed the same  Social Determinants of Health:  lives in a living facility  Disposition: Discharge  Co morbidities that complicate the patient evaluation  Past Medical History:  Diagnosis Date   Bronchiectasis without complication (HCC)    Enlarged prostate    Hearing loss    History of kidney stones    Hypertension    Pulmonary nocardiosis 2021   treated with 6 month antibiotic course   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

## 2024-04-20 NOTE — ED Notes (Addendum)
 Please call Samuel Mata (daughter) when pt picked up and leaving. Daughter wants to be with him.

## 2024-04-20 NOTE — Progress Notes (Signed)
 Orthopedic Tech Progress Note Patient Details:  Nealy Karapetian 07-14-1930 [id number] LV2T FOT. Dismissed by nurse. Patient ID: Harwood Nall, male   DOB: Nov 23, 1930, 89 y.o.   MRN: [id number]  Giovanni LITTIE Lukes 04/20/2024, 3:13 AM

## 2024-04-29 ENCOUNTER — Ambulatory Visit

## 2024-04-29 DIAGNOSIS — I639 Cerebral infarction, unspecified: Secondary | ICD-10-CM | POA: Diagnosis not present

## 2024-04-30 ENCOUNTER — Encounter

## 2024-05-01 LAB — CUP PACEART REMOTE DEVICE CHECK
Date Time Interrogation Session: 20251213231651
Implantable Pulse Generator Implant Date: 20220901

## 2024-05-02 ENCOUNTER — Emergency Department (HOSPITAL_COMMUNITY)
Admission: EM | Admit: 2024-05-02 | Discharge: 2024-05-02 | Disposition: A | Discharge status: Skilled Nursing Facility | Attending: Emergency Medicine | Admitting: Emergency Medicine

## 2024-05-02 ENCOUNTER — Emergency Department (HOSPITAL_COMMUNITY)

## 2024-05-02 ENCOUNTER — Other Ambulatory Visit: Payer: Self-pay

## 2024-05-02 DIAGNOSIS — S0990XA Unspecified injury of head, initial encounter: Secondary | ICD-10-CM | POA: Diagnosis not present

## 2024-05-02 DIAGNOSIS — Z8673 Personal history of transient ischemic attack (TIA), and cerebral infarction without residual deficits: Secondary | ICD-10-CM | POA: Diagnosis not present

## 2024-05-02 DIAGNOSIS — R1032 Left lower quadrant pain: Secondary | ICD-10-CM | POA: Diagnosis not present

## 2024-05-02 DIAGNOSIS — R1031 Right lower quadrant pain: Secondary | ICD-10-CM | POA: Insufficient documentation

## 2024-05-02 DIAGNOSIS — S8012XA Contusion of left lower leg, initial encounter: Secondary | ICD-10-CM | POA: Insufficient documentation

## 2024-05-02 DIAGNOSIS — G9389 Other specified disorders of brain: Secondary | ICD-10-CM | POA: Insufficient documentation

## 2024-05-02 DIAGNOSIS — W19XXXA Unspecified fall, initial encounter: Secondary | ICD-10-CM | POA: Insufficient documentation

## 2024-05-02 DIAGNOSIS — R350 Frequency of micturition: Secondary | ICD-10-CM | POA: Insufficient documentation

## 2024-05-02 DIAGNOSIS — S8011XA Contusion of right lower leg, initial encounter: Secondary | ICD-10-CM | POA: Diagnosis not present

## 2024-05-02 DIAGNOSIS — S8991XA Unspecified injury of right lower leg, initial encounter: Secondary | ICD-10-CM | POA: Diagnosis present

## 2024-05-02 DIAGNOSIS — Z7902 Long term (current) use of antithrombotics/antiplatelets: Secondary | ICD-10-CM | POA: Insufficient documentation

## 2024-05-02 DIAGNOSIS — R600 Localized edema: Secondary | ICD-10-CM | POA: Insufficient documentation

## 2024-05-02 LAB — URINALYSIS, MICROSCOPIC (REFLEX)

## 2024-05-02 LAB — CBC WITH DIFFERENTIAL/PLATELET
Abs Immature Granulocytes: 0.02 K/uL (ref 0.00–0.07)
Basophils Absolute: 0 K/uL (ref 0.0–0.1)
Basophils Relative: 1 %
Eosinophils Absolute: 0.1 K/uL (ref 0.0–0.5)
Eosinophils Relative: 1 %
HCT: 38.6 % — ABNORMAL LOW (ref 39.0–52.0)
Hemoglobin: 12.6 g/dL — ABNORMAL LOW (ref 13.0–17.0)
Immature Granulocytes: 0 %
Lymphocytes Relative: 13 %
Lymphs Abs: 1.1 K/uL (ref 0.7–4.0)
MCH: 31.3 pg (ref 26.0–34.0)
MCHC: 32.6 g/dL (ref 30.0–36.0)
MCV: 96 fL (ref 80.0–100.0)
Monocytes Absolute: 1.4 K/uL — ABNORMAL HIGH (ref 0.1–1.0)
Monocytes Relative: 16 %
Neutro Abs: 6.2 K/uL (ref 1.7–7.7)
Neutrophils Relative %: 69 %
Platelets: 292 K/uL (ref 150–400)
RBC: 4.02 MIL/uL — ABNORMAL LOW (ref 4.22–5.81)
RDW: 12.9 % (ref 11.5–15.5)
WBC: 8.8 K/uL (ref 4.0–10.5)
nRBC: 0 % (ref 0.0–0.2)

## 2024-05-02 LAB — COMPREHENSIVE METABOLIC PANEL WITH GFR
ALT: 7 U/L (ref 0–44)
AST: 26 U/L (ref 15–41)
Albumin: 3.3 g/dL — ABNORMAL LOW (ref 3.5–5.0)
Alkaline Phosphatase: 97 U/L (ref 38–126)
Anion gap: 9 (ref 5–15)
BUN: 17 mg/dL (ref 8–23)
CO2: 24 mmol/L (ref 22–32)
Calcium: 9.1 mg/dL (ref 8.9–10.3)
Chloride: 105 mmol/L (ref 98–111)
Creatinine, Ser: 0.79 mg/dL (ref 0.61–1.24)
GFR, Estimated: >60 mL/min (ref >60)
Glucose, Bld: 95 mg/dL (ref 70–99)
Potassium: 3.6 mmol/L (ref 3.5–5.1)
Sodium: 139 mmol/L (ref 135–145)
Total Bilirubin: 0.5 mg/dL (ref 0.0–1.2)
Total Protein: 6.3 g/dL — ABNORMAL LOW (ref 6.5–8.1)

## 2024-05-02 LAB — URINALYSIS, ROUTINE W REFLEX MICROSCOPIC
Bilirubin Urine: NEGATIVE
Glucose, UA: NEGATIVE mg/dL
Hgb urine dipstick: NEGATIVE
Ketones, ur: NEGATIVE mg/dL
Nitrite: POSITIVE — AB
Protein, ur: NEGATIVE mg/dL
Specific Gravity, Urine: 1.02 (ref 1.005–1.030)
pH: 7.5 (ref 5.0–8.0)

## 2024-05-02 LAB — CK: Total CK: 209 U/L (ref 49–397)

## 2024-05-02 MED ORDER — CEFPODOXIME PROXETIL 100 MG PO TABS: 100.0000 mg | ORAL_TABLET | Freq: Two times a day (BID) | ORAL | 0 refills | Status: DC

## 2024-05-02 MED ORDER — ACETAMINOPHEN 325 MG PO TABS
650.0000 mg | ORAL_TABLET | ORAL | Status: DC
  Filled 2024-05-02: qty 2

## 2024-05-02 NOTE — ED Notes (Signed)
 Pt in bed, pt is only oriented to self, pt states that he doesn't want the tylenol  because he doesn't have any pain, resps even and unlabored

## 2024-05-02 NOTE — ED Provider Notes (Signed)
 [place] EMERGENCY DEPARTMENT AT [facility] Provider Note   CSN: [id number] Arrival date & time: 05/02/24  9296     Patient presents with: No chief complaint on file.   Samuel Mata is a 89 y.o. male.   Samuel Mata is a 89 year old gentleman with a past medical history of several recent ED visits for fall (12/5, 11/20), dementia, bronchiectasis, essential hypertension, CVA, nephrolithiasis.  He presents today for a unwitnessed fall on Plavix  from [facility] [place].  Daughter at bedside stated that the fall occurred between 2 AM where he was last seen asleep and 6:20 AM where she was called about the fall.  When he was found he was unresponsive and tangled in bed sheets.  Daughter reports that he falls frequently usually going to the bathroom at night without assistance. Patient participates with PT at his SNF he stated that he been doing well.  Denies any history of seizures, shaking during falling episodes, incontinent episodes (he does wear incontinence briefs, however he is fully continent).  Patient is hard of hearing but was able to answer questions appropriately.  He states he does not remember falling.  Endorses only pain to bilateral groins and increased urinary frequency (has been treated for UTIs in the past with AMS, however PCP switched to Lasix  20 mg daily from 3 times a week on 05/01/2024 due to increased leg swelling).  Denies chest pain, palpitations, shortness of breath, abdominal pain, dysuria, back pain, hip pain, leg pain.  The history is provided by the patient and a relative.       Prior to Admission medications  Medication Sig Start Date End Date Taking? Authorizing Provider  acetaminophen  (TYLENOL ) 325 MG tablet Take 650 mg by mouth in the morning, at noon, and at bedtime.    [provider]  clopidogrel  (PLAVIX ) 75 MG tablet Take 1 tablet (75 mg total) by mouth daily. 11/22/23   Angiulli, Toribio [name], PA-C  docusate sodium  (COLACE) 100 MG  capsule Take 1 capsule (100 mg total) by mouth 2 (two) times daily. 11/22/23   Angiulli, Toribio [name], PA-C  famotidine  (PEPCID ) 20 MG tablet Take 1 tablet (20 mg total) by mouth daily. 11/22/23   Angiulli, Toribio [name], PA-C  furosemide  (LASIX ) 20 MG tablet Take 20 mg by mouth every Monday, Wednesday, and Friday.    [provider]  melatonin 3 MG TABS tablet Take 1 tablet (3 mg total) by mouth at bedtime. 11/22/23   Angiulli, Daniel J, PA-C  Multiple Vitamin (MULTIVITAMIN WITH MINERALS) TABS tablet Take 1 tablet by mouth daily. 11/06/23   Rashid, Farhan, MD  polyethylene glycol powder (GLYCOLAX /MIRALAX ) 17 GM/SCOOP powder Take 17 g by mouth in the morning and at bedtime.    [provider]  rosuvastatin  (CRESTOR ) 20 MG tablet Take 1 tablet (20 mg total) by mouth daily. 11/22/23   Angiulli, Toribio [name], PA-C  tamsulosin  (FLOMAX ) 0.4 MG CAPS capsule Take 1 capsule (0.4 mg total) by mouth daily after supper. Patient taking differently: Take 0.4 mg by mouth at bedtime. 11/22/23   Angiulli, Toribio [name], PA-C  Vitamin D , Ergocalciferol , (DRISDOL ) 1.25 MG (50000 UNIT) CAPS capsule Take 1 capsule (50,000 Units total) by mouth every 7 (seven) days. Patient taking differently: Take 50,000 Units by mouth every Thursday. 11/22/23   Angiulli, Toribio [name], PA-C    Allergies: Codeine    Review of Systems  Updated Vital Signs There were no vitals taken for this visit.  Physical Exam Constitutional:  Comments: Chronically ill-appearing gentleman, sleepy but awakes with verbal stimuli.  Follows all commands and answers questions appropriately.  HENT:     Head: Atraumatic.     Mouth/Throat:     Mouth: Mucous membranes are dry.  Neck:     Comments: In c-collar Cardiovascular:     Rate and Rhythm: Normal rate and regular rhythm.     Pulses:          Dorsalis pedis pulses are 1+ on the right side and 1+ on the left side.     Heart sounds: No murmur heard.    No friction rub. No gallop.  Pulmonary:      Effort: Pulmonary effort is normal.     Breath sounds: Normal breath sounds. No stridor. No wheezing, rhonchi or rales.  Abdominal:     General: Bowel sounds are normal.     Palpations: Abdomen is soft.     Tenderness: There is no abdominal tenderness. There is no guarding.  Musculoskeletal:        General: No swelling or deformity.     Right lower leg: Edema present.     Left lower leg: Edema present.  Skin:    General: Skin is warm and dry.     Findings: Bruising (Along extremities) present.  Neurological:     General: No focal deficit present.     Mental Status: He is alert.     Sensory: No sensory deficit.     Motor: No weakness.     Comments: Gait deferred     (all labs ordered are listed, but only abnormal results are displayed) Labs Reviewed - No data to display  EKG: None  Radiology: No results found.   Procedures   Medications Ordered in the ED - No data to display                                  Medical Decision Making This patient is a 89 y.o. male who presents to the ED for concern of unwitnessed fall on blood thinner (Plavix ), this involves an extensive number of treatment options, and is a complaint that carries with it a high risk of complications and morbidity. The emergent differential diagnosis prior to evaluation includes, but is not limited to, head trauma, cervical trauma, hip trauma, UTI, syncope, seizure. This is not an exhaustive differential.   Past Medical History / Co-morbidities / Social History: -Dementia -Recurrent falls - Bronchiectasis - Essential hypertension - CVA - Nephrolithiasis  Additional history: Chart reviewed. Pertinent results include:  Recent ED visits 12/5 and 11/20 for fall.  CT pelvis done on 12/5: 1. No acute osseous abnormality. 2. Unchanged subacute subcapital left femoral neck fracture compared with 04/01/2024. No new fracture identified. He is s/p left hip replacement around mid June.  He fell 7/27 and had a  hairline fracture without treatment per daughter.   Physical Exam: Physical exam performed. The pertinent findings include:  Alert to verbal stimuli, following all commands, answering all questions appropriately.  No focal deficit noted, sensation intact, motor intact.  Heart regular rate and rhythm, lungs clear to auscultation bilaterally in anterior lung fields.  No obvious signs of trauma to head.  Lab Tests: I ordered, and personally interpreted labs. The pertinent results include:  -CBC: Unremarkable -CMP: unremarkable  -UA: Leukocyte small, nitrate positive, rare bacteria.  -CK: 209  Imaging Studies: I ordered imaging studies including and I independently visualized  and interpreted imaging - I agree with the radiologist interpretation. Below are the tests ordered and my interpretations: -CT head: No acute intracranial abnormality, age-related atrophy -CT spine: No acute findings, moderate chronic degenerative disc disease C5-6 and C6-7 -DG hip: No acute fracture, status post or ORIF left femur with screw and rod in place  EKG/Cardiac Monitoring:  My attending physician Dr. Emil viewed and interpreted the EKG which showed an underlying rhythm of: Sinus rhythm, left ventricular hypertrophy. I agree with this interpretation.  Medications: I ordered medication including - Acetaminophen  650 mg every 4 as needed for pain  MDM: Patient presented after unwitnessed fall on blood thinners.  Patient has been seen in the ED for several falls within the past year.  Previous workup negative.  Differential diagnosis includes seizures (history of dementia, unlikely postictal state however unknown, no history of seizures, no incontinent episodes noted, no witnessed shaking.), syncopal episode (patient cannot recall prodromal symptoms, EKG in sinus rhythm, however cannot rule out cardiac cause at that time, denies palpitations or chest pain, BP 167/86 with pulse 84.).  Family states that falls  generally occur when trying to get up and ambulate to the restroom without help - this episode he was found wrapped in his bed sheets.  Given the presentation, likely due to loss of footing. CT spine, head and DG hip showed no acute traumatic findings.  UA positive for small leukocytes, nitrate, and rare bacteria.  Insetting of increased urinary frequency (unlikely increase Lasix  dosing due to change having just occurred 12/16), will treat for UTI with 7 days of Vantin  BID. other labs were unremarkable.  Pain was controlled with Tylenol .  Discussed with family conversing with PCP about decreasing Lasix  frequency given history of falls especially around bedtime/a.m and to discuss with PCP about urology referral given recurrent UTI.   I discussed this case with my attending physician Dr. Emil who cosigned this note including patient's presenting symptoms, physical exam, and planned diagnostics and interventions. Attending physician stated agreement with plan or made changes to plan which were implemented.      Amount and/or Complexity of Data Reviewed Labs: ordered. Radiology: ordered.  Risk OTC drugs. Prescription drug management.       Final diagnoses:  None    ED Discharge Orders     None          Kamori Kitchens, DO 05/02/24 1204    Emil Share, DO 05/02/24 1458

## 2024-05-02 NOTE — ED Notes (Signed)
 Patient transported to X-ray

## 2024-05-02 NOTE — ED Triage Notes (Signed)
 Pt bib ems from [facility] [place] c/o unwitnessed fall.   Pt was found on floor by staff and unknown how long down. Pt unsure if he hit head or LOC   Hx Dementia  Pt has shortening of left leg with outward rotation  Pt is on Plavix   HR 80 RA 94 BP 160/100 CBG 118

## 2024-05-02 NOTE — ED Notes (Signed)
 Report called to [facility] at [facility], report given to [facility], report given to [facility], called daughter Lavern and updated pt.  PTAR has pt's dnr papers, pt from department via [facility].

## 2024-05-02 NOTE — Discharge Instructions (Addendum)
 You were evaluated for an unwitnessed fall with concern of possible head trauma, neck trauma, hip trauma.  The imaging did not show any evidence of bleed or fracture.  Your urinalysis was positive for white blood cells and small amounts of bacteria. We will treat you for a UTI with Vantin  twice daily (every 12 hours) for 7 days. If you have another fall, worsening Headaches, changes in vision, fevers, chills, pain in back/side, or confusion return back to ED.   Consider speaking with your primary care doctor about seeing urology for recurrent UTIs. Consider speaking with your primary care provider again about lasix  frequency given recurrent falls, specifically when going to the bathroom late at night/early morning.    Thank you for letting me be a part of your care.  Shanaia Sievers, DO

## 2024-05-03 ENCOUNTER — Encounter

## 2024-05-04 NOTE — Progress Notes (Signed)
 Remote Loop Recorder Transmission

## 2024-05-16 ENCOUNTER — Ambulatory Visit: Payer: Self-pay | Admitting: Cardiovascular Disease

## 2024-05-17 ENCOUNTER — Encounter

## 2024-05-30 ENCOUNTER — Encounter

## 2024-05-30 DIAGNOSIS — I639 Cerebral infarction, unspecified: Secondary | ICD-10-CM | POA: Diagnosis not present

## 2024-05-30 LAB — CUP PACEART REMOTE DEVICE CHECK
Date Time Interrogation Session: 20260113231711
Implantable Pulse Generator Implant Date: 20220901

## 2024-05-31 ENCOUNTER — Encounter

## 2024-06-01 ENCOUNTER — Ambulatory Visit: Payer: Self-pay | Admitting: Cardiovascular Disease

## 2024-06-04 ENCOUNTER — Encounter

## 2024-06-06 NOTE — Progress Notes (Signed)
 Remote Loop Recorder Transmission

## 2024-06-14 ENCOUNTER — Encounter (HOSPITAL_COMMUNITY): Payer: Self-pay | Admitting: *Deleted

## 2024-06-14 ENCOUNTER — Emergency Department (HOSPITAL_COMMUNITY)
Admission: EM | Admit: 2024-06-14 | Discharge: 2024-06-14 | Disposition: A | Discharge status: Home / Self Care | Attending: Emergency Medicine | Admitting: Emergency Medicine

## 2024-06-14 ENCOUNTER — Emergency Department (HOSPITAL_COMMUNITY)

## 2024-06-14 DIAGNOSIS — Z7902 Long term (current) use of antithrombotics/antiplatelets: Secondary | ICD-10-CM | POA: Insufficient documentation

## 2024-06-14 DIAGNOSIS — Z9181 History of falling: Secondary | ICD-10-CM | POA: Insufficient documentation

## 2024-06-14 DIAGNOSIS — N200 Calculus of kidney: Secondary | ICD-10-CM | POA: Insufficient documentation

## 2024-06-14 DIAGNOSIS — S0990XA Unspecified injury of head, initial encounter: Secondary | ICD-10-CM | POA: Insufficient documentation

## 2024-06-14 DIAGNOSIS — W19XXXA Unspecified fall, initial encounter: Secondary | ICD-10-CM

## 2024-06-14 DIAGNOSIS — Y92002 Bathroom of unspecified non-institutional (private) residence single-family (private) house as the place of occurrence of the external cause: Secondary | ICD-10-CM | POA: Insufficient documentation

## 2024-06-14 DIAGNOSIS — W01198A Fall on same level from slipping, tripping and stumbling with subsequent striking against other object, initial encounter: Secondary | ICD-10-CM | POA: Insufficient documentation

## 2024-06-14 DIAGNOSIS — S32010A Wedge compression fracture of first lumbar vertebra, initial encounter for closed fracture: Secondary | ICD-10-CM

## 2024-06-14 DIAGNOSIS — I700 Atherosclerosis of aorta: Secondary | ICD-10-CM | POA: Insufficient documentation

## 2024-06-14 DIAGNOSIS — R918 Other nonspecific abnormal finding of lung field: Secondary | ICD-10-CM | POA: Insufficient documentation

## 2024-06-14 DIAGNOSIS — S32019A Unspecified fracture of first lumbar vertebra, initial encounter for closed fracture: Secondary | ICD-10-CM | POA: Insufficient documentation

## 2024-06-14 LAB — COMPREHENSIVE METABOLIC PANEL WITH GFR
ALT: 7 U/L (ref 0–44)
AST: 23 U/L (ref 15–41)
Albumin: 3.8 g/dL (ref 3.5–5.0)
Alkaline Phosphatase: 83 U/L (ref 38–126)
Anion gap: 9 (ref 5–15)
BUN: 28 mg/dL — ABNORMAL HIGH (ref 8–23)
CO2: 26 mmol/L (ref 22–32)
Calcium: 9.4 mg/dL (ref 8.9–10.3)
Chloride: 103 mmol/L (ref 98–111)
Creatinine, Ser: 0.99 mg/dL (ref 0.61–1.24)
GFR, Estimated: >60 mL/min
Glucose, Bld: 93 mg/dL (ref 70–99)
Potassium: 4.6 mmol/L (ref 3.5–5.1)
Sodium: 138 mmol/L (ref 135–145)
Total Bilirubin: 0.4 mg/dL (ref 0.0–1.2)
Total Protein: 7.4 g/dL (ref 6.5–8.1)

## 2024-06-14 LAB — URINALYSIS, ROUTINE W REFLEX MICROSCOPIC
Bilirubin Urine: NEGATIVE
Glucose, UA: NEGATIVE mg/dL
Hgb urine dipstick: NEGATIVE
Ketones, ur: NEGATIVE mg/dL
Leukocytes,Ua: NEGATIVE
Nitrite: NEGATIVE
Protein, ur: NEGATIVE mg/dL
Specific Gravity, Urine: 1.011 (ref 1.005–1.030)
pH: 7 (ref 5.0–8.0)

## 2024-06-14 LAB — ETHANOL: Alcohol, Ethyl (B): <15 mg/dL

## 2024-06-14 LAB — CBC
HCT: 43.1 % (ref 39.0–52.0)
Hemoglobin: 13.8 g/dL (ref 13.0–17.0)
MCH: 30.9 pg (ref 26.0–34.0)
MCHC: 32 g/dL (ref 30.0–36.0)
MCV: 96.4 fL (ref 80.0–100.0)
Platelets: 382 10*3/uL (ref 150–400)
RBC: 4.47 MIL/uL (ref 4.22–5.81)
RDW: 13.2 % (ref 11.5–15.5)
WBC: 6.7 10*3/uL (ref 4.0–10.5)
nRBC: 0 % (ref 0.0–0.2)

## 2024-06-14 LAB — I-STAT CHEM 8, ED
BUN: 30 mg/dL — ABNORMAL HIGH (ref 8–23)
Calcium, Ion: 1.21 mmol/L (ref 1.15–1.40)
Chloride: 103 mmol/L (ref 98–111)
Creatinine, Ser: 1 mg/dL (ref 0.61–1.24)
Glucose, Bld: 92 mg/dL (ref 70–99)
HCT: 42 % (ref 39.0–52.0)
Hemoglobin: 14.3 g/dL (ref 13.0–17.0)
Potassium: 4.5 mmol/L (ref 3.5–5.1)
Sodium: 141 mmol/L (ref 135–145)
TCO2: 28 mmol/L (ref 22–32)

## 2024-06-14 LAB — TYPE AND SCREEN
ABO/RH(D): B POS
Antibody Screen: NEGATIVE

## 2024-06-14 LAB — I-STAT CG4 LACTIC ACID, ED: Lactic Acid, Venous: 1.1 mmol/L (ref 0.5–1.9)

## 2024-06-14 LAB — PROTIME-INR
INR: 1 (ref 0.8–1.2)
Prothrombin Time: 13.6 s (ref 11.4–15.2)

## 2024-06-14 MED ORDER — ACETAMINOPHEN 500 MG PO TABS
1000.0000 mg | ORAL_TABLET | Freq: Once | ORAL | Status: AC
  Administered 2024-06-14: 1000 mg via ORAL
  Filled 2024-06-14: qty 2

## 2024-06-14 NOTE — Progress Notes (Signed)
 89 y/o M w/ recurrent falls who presents after fall. CT shows L1 compression fracture with near complete loss of height and mild retropulsion. This is worse compared to a CT from 11/2023 which shows a mild superior endplate fx  L1 fracture is too flat to safely perform a kyphoplasty Recommend TLSO brace and outpatient follow-up

## 2024-06-14 NOTE — ED Notes (Signed)
 Xray remains at [facility]. Preparing to go to CT. Daughter present. Pt remains alert, NAD, calm, interactive, intermittent moaning with movement.

## 2024-06-14 NOTE — ED Notes (Signed)
Lab finished at [facility] 

## 2024-06-14 NOTE — Progress Notes (Signed)
 Orthopedic Tech Progress Note Patient Details:  Samuel Mata November 10, 1930 [id number]  Ortho Devices Type of Ortho Device: Thoracolumbar corset (TLSO) Ortho Device/Splint Interventions: Ordered, Application, Adjustment   Post Interventions Patient Tolerated: Well Instructions Provided: Care of device, Adjustment of device  [name] [name] 06/14/2024, 10:20 PM

## 2024-06-14 NOTE — ED Notes (Signed)
 Daughter into room, at [facility]

## 2024-06-14 NOTE — ED Notes (Signed)
 Report called to Dwayne, CHAR[facility]DRAISER at [facility]

## 2024-06-14 NOTE — ED Notes (Signed)
Lab at [facility] 

## 2024-06-14 NOTE — ED Triage Notes (Signed)
 BIB [facility] BLS truck from [facility] off of [address] s/p unwitnessed fall in b/r. States hit posterior head with hematoma reported. C/o lumbar back pain, R hip/groin pain and posterior head. No shortening or rotation noted. CMS intact. BLE edematous. Reports no LOC. Takes plavix . DNR and MAR present. Glasses and hearing aids bilaterally present. Pt is HOH. Arrives as level 2 trauma for FOT. Arrives alert, NAD, calm, interactive, intermittent moaning, worse with palpation and movement. MAEx4. Abd soft NT. LS CTA. Reports multiple recent falls. Answers questions, follows commands.

## 2024-06-14 NOTE — ED Notes (Signed)
 To CT on monitor with RN, no changes, remains calm, NAD, alert, interactive, stable. VSS.

## 2024-06-14 NOTE — ED Notes (Signed)
 Pt mentions no gall bladder or spleen. Remains alert, NAD, calm, interactive, resps e/u, speaking clearly, answering questions following commands.

## 2024-06-14 NOTE — Discharge Instructions (Signed)
 Thank you for letting us  take care of you today.  You came in today for evaluation of a fall.  We did CT scans that did show a worsening of your known fracture of L1.  We did speak with the neurosurgery team and they will see you in the clinic.  We will send you home with a brace to wear for comfort.  Please follow-up with the neurosurgery team and call them for an appointment.

## 2024-06-14 NOTE — ED Notes (Signed)
 CCMD called by this RN

## 2024-06-14 NOTE — ED Provider Notes (Signed)
 " [facility] Provider Note   CSN: [id number] Arrival date & time: 06/14/24  1631     Patient presents with: No chief complaint on file.   Samuel Mata is a 89 y.o. male who presents today for evaluation of a mechanical fall.  Patient reports that he was in the bathroom when he lost his balance and fell backward striking his head as well as his back.  Chief complaint this time is lower back pain.  Denies any LOC.  Denies any peripheral weakness or numbness.  Is currently on Plavix .  HPI     Prior to Admission medications  Medication Sig Start Date End Date Taking? Authorizing Provider  acetaminophen  (TYLENOL ) 325 MG tablet Take 650 mg by mouth in the morning, at noon, and at bedtime.    [provider]  cefpodoxime  (VANTIN ) 100 MG tablet Take 1 tablet (100 mg total) by mouth 2 (two) times daily. 05/02/24   Rihner, Emilie, DO  clopidogrel  (PLAVIX ) 75 MG tablet Take 1 tablet (75 mg total) by mouth daily. 11/22/23   Angiulli, Toribio [name], PA-C  docusate sodium  (COLACE) 100 MG capsule Take 1 capsule (100 mg total) by mouth 2 (two) times daily. 11/22/23   Angiulli, Toribio [name], PA-C  famotidine  (PEPCID ) 20 MG tablet Take 1 tablet (20 mg total) by mouth daily. 11/22/23   Angiulli, Toribio [name], PA-C  furosemide  (LASIX ) 20 MG tablet Take 20 mg by mouth every Monday, Wednesday, and Friday.    [provider]  melatonin 3 MG TABS tablet Take 1 tablet (3 mg total) by mouth at bedtime. 11/22/23   Angiulli, Marco Raper J, PA-C  Multiple Vitamin (MULTIVITAMIN WITH MINERALS) TABS tablet Take 1 tablet by mouth daily. 11/06/23   Rashid, Farhan, MD  polyethylene glycol powder (GLYCOLAX /MIRALAX ) 17 GM/SCOOP powder Take 17 g by mouth in the morning and at bedtime.    [provider]  rosuvastatin  (CRESTOR ) 20 MG tablet Take 1 tablet (20 mg total) by mouth daily. 11/22/23   Angiulli, Toribio [name], PA-C  tamsulosin  (FLOMAX ) 0.4 MG CAPS capsule Take 1 capsule (0.4 mg  total) by mouth daily after supper. Patient taking differently: Take 0.4 mg by mouth at bedtime. 11/22/23   Angiulli, Toribio [name], PA-C  Vitamin D , Ergocalciferol , (DRISDOL ) 1.25 MG (50000 UNIT) CAPS capsule Take 1 capsule (50,000 Units total) by mouth every 7 (seven) days. Patient taking differently: Take 50,000 Units by mouth every Thursday. 11/22/23   Angiulli, Toribio [name], PA-C    Allergies: Codeine    Review of Systems  Updated Vital Signs There were no vitals taken for this visit.  Physical Exam Constitutional:      General: He is not in acute distress.    Appearance: Normal appearance.  HENT:     Head: Normocephalic.     Right Ear: External ear normal.     Left Ear: External ear normal.     Nose: Nose normal.     Mouth/Throat:     Mouth: Mucous membranes are moist.  Eyes:     Pupils: Pupils are equal, round, and reactive to light.  Cardiovascular:     Rate and Rhythm: Normal rate and regular rhythm.     Pulses: Normal pulses.  Pulmonary:     Effort: Pulmonary effort is normal.     Breath sounds: Normal breath sounds.  Abdominal:     General: Abdomen is flat.  Musculoskeletal:        General: Normal range of  motion.     Cervical back: Normal range of motion. Tenderness present.  Skin:    General: Skin is warm.     Capillary Refill: Capillary refill takes less than 2 seconds.  Neurological:     General: No focal deficit present.     Mental Status: He is alert.     Cranial Nerves: No cranial nerve deficit.     Motor: No weakness.  Psychiatric:        Mood and Affect: Mood normal.     (all labs ordered are listed, but only abnormal results are displayed) Labs Reviewed  COMPREHENSIVE METABOLIC PANEL WITH GFR  CBC  ETHANOL  URINALYSIS, ROUTINE W REFLEX MICROSCOPIC  PROTIME-INR  I-STAT CHEM 8, ED  I-STAT CG4 LACTIC ACID, ED  TYPE AND SCREEN    EKG: None  Radiology: CT Lumbar Spine Wo Contrast Result Date: 06/14/2024 EXAM: CT OF THE LUMBAR SPINE WITHOUT  CONTRAST 06/14/2024 06:21:44 PM TECHNIQUE: CT of the lumbar spine was performed without the administration of intravenous contrast. Multiplanar reformatted images are provided for review. Automated exposure control, iterative reconstruction, and/or weight based adjustment of the mA/kV was utilized to reduce the radiation dose to as low as reasonably achievable. COMPARISON: None available. CLINICAL HISTORY: Back trauma, no prior imaging (Age >= 16y) Back trauma; no prior imaging; age 28 years or older. FINDINGS: BONES AND ALIGNMENT: Diffusely decreased bone density. Interval worsening of an L2 compression fracture with now 70% vertebral body height loss. Associated 3 mm retrolisthesis into the central canal. Grade 1 anterolisthesis of L4 on L5. Normal vertebral body heights for other visualized levels. No suspicious bone lesion. DEGENERATIVE CHANGES: Intervertebral disc space vacuum phenomenon at the L4-L5, L5-S1 levels. SOFT TISSUES: Severe atherosclerotic plaque of the abdominal aorta. Nonobstructive left nephrolithiasis. Fluid density lesions within the kidneys likely represent simple renal cysts. Per consensus, no follow-up is needed for simple Bosniak type 1 and 2 renal cysts, unless the patient has a malignancy history or risk factors. No acute abnormality for other visualized soft tissues. IMPRESSION: 1. Interval worsening of an L2 compression fracture with now 70% vertebral body height loss and associated 3 mm retrolisthesis into the central canal. Correlate with point tenderness to palpation to evaluate for an acute component. 2. Severe atherosclerotic plaque of the abdominal aorta. 3. Nonobstructive left nephrolithiasis. Electronically signed by: Morgane Naveau MD 06/14/2024 07:05 PM EST RP Workstation: [id number]   CT Thoracic Spine Wo Contrast Result Date: 06/14/2024 EXAM: CT THORACIC SPINE WITHOUT CONTRAST 06/14/2024 06:21:44 PM TECHNIQUE: CT of the thoracic spine was performed without the  administration of intravenous contrast. Multiplanar reformatted images are provided for review. Automated exposure control, iterative reconstruction, and/or weight based adjustment of the mA/kV was utilized to reduce the radiation dose to as low as reasonably achievable. COMPARISON: CT lumbar spine 12/08/2023 CLINICAL HISTORY: Back trauma, no prior imaging (Age >= 16y). FINDINGS: BONES AND ALIGNMENT: Diffusely decreased bone density. Stable T12 compression fracture status post kyphoplasty. Stable associated 3 mm retropulsion into the central canal. Prominent Schmorl's node along the T6 superior endplate with slight concavity suggestive of chronic fracture. Slight concavity of the T4 superior endplate suggestive of an age-indeterminate compression fracture. No acute displaced fracture. Normal alignment. DEGENERATIVE CHANGES: No significant degenerative changes. SOFT TISSUES: At least moderate-to-severe atherosclerotic plaque of the aorta. Possible tiny hiatal hernia. 4-vessel coronary artery calcifications. Periseptal thickening. Peripheral reticulations. Bronchiectasis. Tree-in-bud nodularities within the lungs. No other acute soft tissue abnormality. IMPRESSION: 1. No acute displaced thoracic spine fracture. 2. Slight  concavity of the T4 superior endplate, suggestive of an age-indeterminate compression fracture. Correlate with point tenderness to palpation throughout for acute component. 3. Stable T12 compression fracture status post kyphoplasty with stable 3 mm retropulsion into the central canal. 4. Tree-in-bud nodularities within the lungs, which can be seen with infectious or inflammatory bronchiolitis. Electronically signed by: Morgane Naveau MD 06/14/2024 07:02 PM EST RP Workstation: [id number]   CT CERVICAL SPINE WO CONTRAST Result Date: 06/14/2024 EXAM: CT CERVICAL SPINE WITHOUT CONTRAST 06/14/2024 06:21:44 PM TECHNIQUE: CT of the cervical spine was performed without the administration of intravenous  contrast. Multiplanar reformatted images are provided for review. Automated exposure control, iterative reconstruction, and/or weight based adjustment of the mA/kV was utilized to reduce the radiation dose to as low as reasonably achievable. COMPARISON: CT abdomen pelvis 05/02/2024 CLINICAL HISTORY: Polytrauma, blunt. FINDINGS: BONES AND ALIGNMENT: No acute fracture or traumatic malalignment. Diffusely increased bone density. DEGENERATIVE CHANGES: Similar appearing multilevel moderate degenerative changes of the spine. No severe osseous, neural foraminal, or central canal stenosis. SOFT TISSUES: No prevertebral soft tissue swelling. Atherosclerotic plaque of the carotid arteries within the neck. LUNGS: Biapical pleural and/or pulmonary scarring. IMPRESSION: 1. No evidence of acute traumatic injury. Electronically signed by: Morgane Naveau MD 06/14/2024 06:59 PM EST RP Workstation: [id number]   CT HEAD WO CONTRAST Result Date: 06/14/2024 EXAM: CT HEAD WITHOUT CONTRAST 06/14/2024 06:21:44 PM TECHNIQUE: CT of the head was performed without the administration of intravenous contrast. Automated exposure control, iterative reconstruction, and/or weight based adjustment of the mA/kV was utilized to reduce the radiation dose to as low as reasonably achievable. COMPARISON: 05/02/2024 CLINICAL HISTORY: Head trauma, moderate-severe. FINDINGS: BRAIN AND VENTRICLES: No acute hemorrhage. No evidence of acute infarct. Cerebral ventricle sizes are concordant with the degree of cerebral volume loss. Patchy and confluent areas of decreased attenuation are noted throughout the deep and periventricular white matter of the cerebral hemispheres bilaterally suggestive of chronic microvascular ischemic changes. No hydrocephalus. No extra-axial collection. No mass effect or midline shift. Intracranial arterial calcification. ORBITS: No acute abnormality. SINUSES: No acute abnormality. SOFT TISSUES AND SKULL: No acute soft tissue  abnormality. No skull fracture. IMPRESSION: 1. No acute intracranial abnormality. Electronically signed by: Morgane Naveau MD 06/14/2024 06:55 PM EST RP Workstation: [id number]   DG HIP UNILAT WITH PELVIS 2-3 VIEWS LEFT Result Date: 06/14/2024 EXAM: 2 or 3 VIEW(S) XRAY OF THE LEFT HIP 06/14/2024 05:58:00 PM COMPARISON: 05/02/2024 CLINICAL HISTORY: Fall. FINDINGS: BONES AND JOINTS: Intramedullary nail fixation of prior left proximal femur fracture in place. 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 The intramedullary rod is not entirely included within the field of view. The visualized pelvis and sacrum appear intact. No acute fracture. No malalignment. SOFT TISSUES: Vascular calcifications. IMPRESSION: 1. No acute findings. 2. Intramedullary nail fixation of prior left proximal femur fracture in place. Electronically signed by: Elsie Gravely MD 06/14/2024 06:36 PM EST RP Workstation: [id number]   DG HIP UNILAT WITH PELVIS 2-3 VIEWS RIGHT Result Date: 06/14/2024 EXAM: 2 or 3 VIEW(S) XRAY OF THE PELVIS AND RIGHT HIP 06/14/2024 05:58:00 PM COMPARISON: 11/14/2023 CLINICAL HISTORY: Fall. FINDINGS: BONES AND JOINTS: SI joints are symmetric. No acute fracture. Bilateral hips demonstrate normal alignment. Mild right femoral head-neck junction degenerative osteophytes. SOFT TISSUES: Vascular calcifications noted. IMPRESSION: 1. No evidence of acute traumatic injury. 2. Mild right femoral head-neck junction degenerative osteophytes. Electronically signed by: Elsie Gravely MD 06/14/2024 06:35 PM EST RP Workstation: [id number]   DG Pelvis Portable Result Date: 06/14/2024  EXAM: 1 or 2 VIEW(S) XRAY OF THE PELVIS 06/14/2024 04:46:29 PM COMPARISON: 04/20/2024 CLINICAL HISTORY: Trauma. FINDINGS: BONES AND JOINTS: Intact hardware. Remote left proximal femur fracture status post ORIF. Degenerative changes in the hips. No acute displaced fractures are identified. SOFT TISSUES:  Unremarkable. IMPRESSION: 1. No acute displaced fracture identified. 2. Remote left proximal femur fracture status post ORIF with intact hardware. Electronically signed by: Elsie Gravely MD 06/14/2024 05:41 PM EST RP Workstation: [id number]   DG Chest Port 1 View Result Date: 06/14/2024 EXAM: 1 VIEW XRAY OF THE CHEST 06/14/2024 04:46:29 PM COMPARISON: 04/01/2024 CLINICAL HISTORY: Trauma. FINDINGS: LINES, TUBES AND DEVICES: Left chest wall loop recorder in place. LUNGS AND PLEURA: Chronic pulmonary fibrosis. Right upper lobe scarring. No pleural effusion. No pneumothorax. HEART AND MEDIASTINUM: Stable cardiomegaly. Aortic atherosclerotic calcification. BONES AND SOFT TISSUES: Vertebral kyphoplasty. No acute abnormalities are demonstrated. IMPRESSION: 1. No acute process. 2. Stable cardiomegaly and aortic atherosclerotic calcification. 3. Chronic pulmonary fibrosis and right upper lobe scarring. Electronically signed by: Elsie Gravely MD 06/14/2024 05:41 PM EST RP Workstation: [id number]     Procedures   Medications Ordered in the ED - No data to display                                  Medical Decision Making Amount and/or Complexity of Data Reviewed Labs: ordered. Radiology: ordered.  Risk OTC drugs.   Patient is a 89 year old male who presents today for evaluation of a ground-level fall.  On initial assessment patient was noted to be mildly hypertensive but otherwise hemodynamically stable and afebrile.  Patient was seen as a level 2 trauma.  No immediate concerns from primary service standpoint.  Chest x-ray and pelvics x-ray without any acute findings.  On my secondary survey patient did have midline spinal tenderness to the lumbar area without any step-offs or deformities.  No additional evidence of trauma.  Trauma imaging most notable for interval worsening of L1 compression fracture with now 70% vertebral body height loss as well as 3 mm of retrolisthesis into the central canal.   No additional evidence of acute trauma.  During patient's time in the emergency department he remained clinically and hemodynamically stable.  On my bedside reassessment patient had resolution of back pain.  Currently has no midline spinal tenderness to palpation and otherwise neurovascular intact distally.  I did get collateral information from daughter at bedside reports that he has had frequent falls during this time.  I did touch base with neurosurgery regarding patient's worsening appearing spine fracture and they recommended no acute surgical intervention.  Will follow-up in the outpatient setting.  TLSO brace placed here in the emergency department until outpatient neurosurgery follow-up.  Return precautions discussed.   Final diagnoses:  Fall, initial encounter  Closed compression fracture of body of L1 vertebra [facility])    ED Discharge Orders     None          Laurita Sieving, MD 06/14/24 [id number]    Bernard Drivers, MD 06/16/24 1425  "

## 2024-06-14 NOTE — ED Notes (Signed)
 Xray in progress, at [facility].

## 2024-06-14 NOTE — ED Notes (Signed)
 Back from CT on monitor via stretcher, with RN, no changes, hearing aids returned to pt, daughter at [facility]. VSS.

## 2024-06-14 NOTE — Progress Notes (Signed)
 Orthopedic Tech Progress Note Patient Details:  Samuel Mata Nov 06, 1930 [id number]  Level II trauma, no ortho tech orders at this time.  Patient ID: Samuel Mata, male   DOB: [birth year], 89 y.o.   MRN: [id number]  Samuel Mata 06/14/2024, 5:43 PM

## 2024-06-14 NOTE — ED Notes (Signed)
 Delay in CT, Xray back at [facility] in room.

## 2024-06-18 ENCOUNTER — Encounter

## 2024-06-30 ENCOUNTER — Encounter

## 2024-07-02 ENCOUNTER — Encounter

## 2024-07-05 ENCOUNTER — Encounter

## 2024-07-31 ENCOUNTER — Ambulatory Visit

## 2024-08-02 ENCOUNTER — Encounter

## 2024-08-06 ENCOUNTER — Encounter

## 2024-08-31 ENCOUNTER — Ambulatory Visit

## 2024-09-03 ENCOUNTER — Encounter

## 2024-09-06 ENCOUNTER — Encounter

## 2024-10-01 ENCOUNTER — Ambulatory Visit

## 2024-11-01 ENCOUNTER — Ambulatory Visit

## 2024-12-02 ENCOUNTER — Ambulatory Visit

## 2025-01-02 ENCOUNTER — Ambulatory Visit

## 2025-02-02 ENCOUNTER — Ambulatory Visit

## 2025-03-05 ENCOUNTER — Ambulatory Visit

## 2025-04-05 ENCOUNTER — Ambulatory Visit

## 2025-05-06 ENCOUNTER — Ambulatory Visit

## 2025-06-06 ENCOUNTER — Ambulatory Visit
